# Patient Record
Sex: Male | Born: 1975 | Race: Black or African American | Hispanic: No | Marital: Married
Health system: Midwestern US, Community
[De-identification: ages and names within clinical notes are randomized; demographics above are authoritative.]

## PROBLEM LIST (undated history)

## (undated) DIAGNOSIS — E7800 Pure hypercholesterolemia, unspecified: Secondary | ICD-10-CM

## (undated) DIAGNOSIS — I219 Acute myocardial infarction, unspecified: Secondary | ICD-10-CM

## (undated) DIAGNOSIS — I341 Nonrheumatic mitral (valve) prolapse: Secondary | ICD-10-CM

## (undated) DIAGNOSIS — I2510 Atherosclerotic heart disease of native coronary artery without angina pectoris: Secondary | ICD-10-CM

## (undated) DIAGNOSIS — E119 Type 2 diabetes mellitus without complications: Secondary | ICD-10-CM

## (undated) DIAGNOSIS — K009 Disorder of tooth development, unspecified: Secondary | ICD-10-CM

## (undated) DIAGNOSIS — K0889 Other specified disorders of teeth and supporting structures: Secondary | ICD-10-CM

## (undated) DIAGNOSIS — K029 Dental caries, unspecified: Secondary | ICD-10-CM

## (undated) DIAGNOSIS — R590 Localized enlarged lymph nodes: Secondary | ICD-10-CM

## (undated) HISTORY — PX: PR MINIMALLY INVASIVE DIRECT CO: S2208

## (undated) HISTORY — PX: CORONARY ANGIOPLASTY WITH STENT PLACEMENT: SHX49

## (undated) NOTE — ED Notes (Signed)
Formatting of this note might be different from the original.  12 Lead EKG performed and given to Corrie Mckusick MD.    Electronically signed by Early Chars, NAI at 10/27/2014 12:16 AM EDT

## (undated) NOTE — ED Provider Notes (Signed)
 Formatting of this note is different from the original.  [facility] EMERGENCY  [address]  [place] [address]  [phone]    History  No chief complaint on file.    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  Denies any vomiting.    Past Medical History:   Diagnosis Date    Coronary artery disease     Hypercholesteremia     MI (myocardial infarction) (CMS/HCC)     x3     HISTORY PROVIDED BY: Patient    Past Surgical History:   Procedure Laterality Date    BYPASS GRAFT      x 2    CHOLECYSTECTOMY       Social History     Tobacco Use    Smoking status: Every Day     Current packs/day: 0.50     Types: Cigarettes    Smokeless tobacco: Never   Substance Use Topics    Drug use: No     Review of Systems   All other systems reviewed and are negative.    Physical Exam  BP (!) 162/83 (BP Location: Left arm, Patient Position: Sitting)   Pulse 76   Temp 97.7 F (36.5 C) (Temporal)   Resp 18   SpO2 99%     Physical Exam  Vitals and nursing note reviewed.   Constitutional:       General: He is not in acute distress.  HENT:      Head: Normocephalic and atraumatic.      Right Ear: External ear normal.      Left Ear: External ear normal.      Mouth/Throat:      Mouth: Mucous membranes are moist.      Pharynx: No oropharyngeal exudate.   Eyes:      Conjunctiva/sclera: Conjunctivae normal.      Pupils: Pupils are equal, round, and reactive to light.   Cardiovascular:      Rate and Rhythm: Normal rate and regular rhythm.   Pulmonary:      Effort: Pulmonary effort is normal. No respiratory distress.      Breath sounds: Normal breath sounds.   Abdominal:      General: Bowel sounds are normal. There is no distension.      Palpations: Abdomen is soft.       Tenderness: There is no abdominal tenderness.   Musculoskeletal:         General: Normal range of motion.      Cervical back: Normal range of motion and neck supple.   Skin:     General: Skin is warm and dry.   Neurological:      Mental Status: He is alert.      Cranial Nerves: Cranial nerves 2-12 are intact.      Sensory: Sensation is intact.      Motor: Motor function is intact.      Coordination: Finger-Nose-Finger Test normal.      Comments: 5/5 strength to B/L U/L extremities; no diminished sensation to light touch     Results  Labs Reviewed   CBC W/ AUTO DIFF - Abnormal       Result Value    WBC 4.88  RBC 4.40      Hemoglobin 13.4 (*)     Hematocrit 40.5      MCV 92.0      MCH 30.5      MCHC 33.1 (*)     RDW 12.7      Platelets 216      MPV 10.4      NRBC 0.0      Neutrophils (Relative) 56.4      Lymphocytes (Relative) 34.0      Monocytes (Relative) 6.8      Eosinophils (Relative) 1.8      Basophils (Relative) 0.6      Immature Granulocytes (Relative) 0.4      Neutrophils (Absolute) 2.8      Lymphocytes (Absolute) 1.7      Monocytes (Absolute) 0.3      Eosinophils (Absolute) 0.1      Basophils (Absolute) 0.0      Immature Granulocytes (Absolute) 0.0     COMPREHENSIVE METABOLIC PANEL - Abnormal    Sodium 138      Potassium 4.0      Chloride 102      CO2 28.7      BUN 11.0      Creatinine 0.95      Glucose 301 (*)     Calcium 8.9      AST 74 (*)     Alkaline Phosphatase 69      Total Protein 7.2      Albumin 3.2 (*)     Total Bilirubin 0.3      Glomerular Filtration Rate >90.0      Anion Gap 7.3      ALT 116 (*)     Narrative:     STAGES OF CHRONIC KIDNEY DISEASE    STAGE      GFR           DESCRIPTION  1           90.0+         Normal Kidney function but urine findings or structural abnormalities or genetic trait point to kidney disease.    2           60.0-89.0     Mildly reduced kidney function, and other findings (as in stage 1) point to kidney disease.    3           30.0-59.0     Moderately reduced  kidney function.    4           15.0-29.0     Severely reduced kidney function.    5           <15.0         Very severe, or endstage kidney failure.   APTT - Abnormal    PTT 26.3 (*)     Narrative:     Population Mean 28 sec.   LIPASE, SERUM - Normal    Lipase 31     PROTIME-INR - Normal    Protime 11.1      INR 1.01      Narrative:     Clinical Situation                       INR Target      Mechanical prosthetic heart valves       2.5-3.5  Prevention of systemic embolism from     2.0-3.0       Acute myocardila infarction  Valvular heart disease       Atrial fibrilation  Pulmonary embolism treatment             2.0-3.0  Venous thrombosis treatment              2.0-3.0   TROPONIN I - Normal    HS Troponin I 67      Narrative:     High-sensitivity Troponin I (hsTnI) assay can reliably detect low troponin concentrations relative to conventional troponin assays. It is the preferred marker of myocardial necrosis as recommended by the Fourth Universal Definition of Myocardial Infarction Guidelines.    The diagnosis of acute myocardial infarction (AMI) is made based on a rise or fall of troponin with at least one measurement exceeding the laboratory's upper limit of normal(indicating myocardial injury), in the context of reasonable suspicion for coronary ischemia (e.g. typical symptoms, changes on ECG, evidence for loss of myocardial function or demonstration of obstructive coronary artery disease).    NOTE: Although abnormal hsTnI values reflect INJURY to myocardial cells, an elevated hsTnI does not indicate the CAUSE of injury (i.e. ischemia versus non-ischemic disease).    In some cases, myocardial injury is chronic and relatively stable such that hsTnI values remain elevated but do not change substantially over hours to days (examples include chronic kidney disease, heart failure or advanced patient age).    When determining whether there has been a significant rise or fall of troponin on serial sampling,  absolute change in troponin concentration has greater diagnostic accuracy for AMI than relative change criteria. A change of >7 ng/L over a 2-hour interval or a change of >10 ng/L over a 3-hour interval is suggested as a significant change.  If the initial hsTnI is below or slightly above the 99th percentile upper reference limit, a 50% change from the baseline is considered significant.  If the initial hsTnI is above the 99th percentile upper reference limit, a 20% change from the baseline value is considered significant.   NT-PROBRAIN NATRIURETIC PEPTIDE - Normal    NT-proBNP 67      Narrative:     BNP Interpretation:    Among patients with dyspnea, BNP is highly sensitive for the  detection of acute congestive heart failure. In addition, a BNP  <300 pg/ml effectively rules out acute congestive heart failure, with  99% negative predictive value.    Knowledge of each individual patient's BNP range may be more  useful than using similar cut-points for every patient.  Marked elevations in BNP levels may be observed in states other  than left ventricular congestive failure, including: acute coronary  syndromes, right heart strain/failure (including pulmonary embolism and  cor pulmonale), critical illness, renal failure, as well as advanced  age.    Falsely low BNP in congestive heart failure patients may be  observed with increasing body--mass index.   URINALYSIS, W/ REFLEX TO MICRO AND CULT, IF INDICATED   TROPONIN I     Imaging  Imaging Results           X-ray Chest 1 View (Final result)  Result time 08/26/22 14:50:40      Final result by Pat Patrick, MD (08/26/22 14:50:40)              Impression:    No acute pulmonary process.             Narrative:    PROCEDURE: XR CHEST 1 VIEW    INDICATION: Chest Pain,    COMPARISON: None.  FINDINGS:   Lines and Tubes: None.    Heart and Mediastinum: The heart and mediastinal contours appear unremarkable.    Lungs and Pleura: The lungs are clear.  There are no pleural  effusions.    Skeletal Structures: Stable postsurgical changes in the sternum including median sternotomy wires and a fixation hardware. No acute osseous findings.                          ED Course      Procedures    MDM  Number of Diagnoses or Management Options  Chest pain  Diagnosis management comments: Differential diagnosis includes: ACS, viral URI, bacterial pneumonia, pneumothorax.    Labs ordered along with EKG, x-ray and respiratory panel.  Initial EKG demonstrates sinus rhythm rate of 73 bpm, otherwise normal axis and intervals, nonspecific ST changes with no old EKG available for prior, repeat EKG demonstrated sinus rhythm at a rate of 75 bpm, otherwise normal axis and intervals, redemonstration of nonspecific ST changes.  Initial troponin is negative, patient multiple allergies to morphine does improve his pain temporarily for roughly 2 hours, labs otherwise unremarkable, given patient's extensive risk factors include previous cardiac arrest, multiple myocardial infarctions, and story concerning for ACS, patient will be admitted to the hospital service.    ED Disposition  Admit    Final diagnoses:   Chest pain     No follow-up provider specified.     Delila Spence, MD  08/26/22 1918    Electronically signed by Delila Spence, MD at 08/26/2022  4:18 PM PDT

## (undated) NOTE — Unmapped External Note (Signed)
 Formatting of this note might be different from the original.    Problem: Pain  Goal: Patient's pain/discomfort is manageable  Description: Assess and monitor patient's pain using appropriate pain scale. Collaborate with interdisciplinary team and initiate plan and interventions as ordered. Re-assess patient's pain level 30 - 60 minutes after pain management intervention.   Outcome: Progressing    Problem: Safety  Goal: Patient will be injury free during hospitalization  Description: Assess and monitor vitals signs, neurological status including level of consciousness and orientation. Assess patient's risk for falls and implement fall prevention plan of care and interventions per hospital policy.     Ensure arm band on, uncluttered walking paths in room, adequate room lighting, call light and overbed table within reach, bed in low position, wheels locked, side rails up per policy, and non-skid footwear provided.   Outcome: Progressing    Problem: Acute Pain:  Goal: Patient reported angina episodes are decreased in frequency, duration and severity  Outcome: Progressing    Problem: Anxiety:  Goal: Achieve calm, comfortable environment  Outcome: Progressing    Electronically signed by Delena Serve, RN at 08/26/2022  9:30 PM PDT

## (undated) NOTE — ED Notes (Signed)
Formatting of this note might be different from the original.  Update    -EKG and trop unremarkable   -CBC, CXR, and BMP unremakralb  -pt still having pain will give morphine   -hospitalist called for admission   Electronically signed by Maudie Flakes., MD at 10/27/2014  4:50 AM EDT

## (undated) NOTE — ED Notes (Signed)
Formatting of this note might be different from the original.  Dr Katrinka Blazing at bedside.  Electronically signed by Martyn Ehrich, RN at 10/27/2014  6:04 AM EDT

## (undated) NOTE — ED Notes (Signed)
Formatting of this note is different from the original.  Patient requesting to leave against medical advise     The patient is clinically sober, free from distracting injury, appears to have intact insight and judgment and reason and in my opinion has the capacity to make decisions.  The patient presents with chest pain and extensive cardiac history. I have explained that I am concerned that this may represent ACS; they have verbalized an understanding of my concerns.  I have told the patient that while their labs were normal, they could still be having a heart attack.  I have discussed the need for admission to get more information about potential causes of the patient?s pain.  I have told the patient that if they leave, they could get much worse, could become critically ill, and could possibly become disabled or die.  I have offered to give the patient more pain medication. I have asked them to stay in the hospital for stress test. I have asked the patient what I can do to convince them to stay for further treatment. He states he has a family emergency and has to leave and no longer has chest pain.  The patient is not willing to undergo any more further work up. He is unwilling to stay overnight for monitoring. He is refusing any further care and is leaving against medical advice.  I am unable to convince the patient to stay, I have asked them to return as soon as possible to complete their evaluation. I have answered all their questions.  Hospitalist was updated above the above  f/u with PCP and cardiology      Electronically signed by Maudie Flakes., MD at 10/27/2014  6:38 AM EDT

## (undated) NOTE — ED Provider Notes (Signed)
Formatting of this note is different from the original.  [facility] Emergency Department History & Physical    Name: Nicholas Salas Age: 69 y.o. Male DOB: 05/17/1976 MRN: [id number] HAR: [id number] Date of Service: 10/27/2014    Chief Complaint   Patient presents with   ? Chest Pain     pt. states he has been having CP x 2 hours, states has had a syncopal episode 2 days ago       HPI: 56 y.o. Male who presents with 1.5 hr h/o sudden on set constant squeezing L sided chest pressure that radiates to his neck and down his L arm; associated with nausea. Symptoms started at rest and feel the same as prior heart attack. Denies Fever, Chills, H/A, SOB, Abdominal Pain, drug use, or Dysuria.     Review of Systems   Constitutional: Negative.  Negative for fever.   HENT: Negative.  Negative for sore throat.    Eyes: Negative.  Negative for visual disturbance.   Respiratory: Negative.  Negative for shortness of breath.    Cardiovascular: Positive for chest pain.   Gastrointestinal: Positive for nausea. Negative for abdominal pain.   Genitourinary: Negative.  Negative for dysuria.   Musculoskeletal: Negative.  Negative for gait problem.   Skin: Negative.  Negative for rash.   Neurological: Negative.  Negative for weakness, numbness and headaches.   Hematological: Negative.  Does not bruise/bleed easily.   Psychiatric/Behavioral: Negative.    All other systems reviewed and are negative.    Reviewed Pertinent: past records, current meds, allergies, vital signs, and nurses notes.  Past Medical History   Diagnosis Date   ? MI, old    ? Heart palpitations    ? Hyperlipemia    ? Hypertension      Past Surgical History   Procedure Laterality Date   ? Sur - coronary artery bypass graft       x 2 procedures   ? Sur - cholecystectomy       Family History   Problem Relation Age of Onset   ? Stroke Father      in 9s     History     Social History   ? Marital Status: Single     Spouse Name: N/A     Number of Children: N/A   ? Years  of Education: N/A     Social History Main Topics   ? Smoking status: Current Every Day Smoker -- 0.20 packs/day     Types: Cigarettes   ? Smokeless tobacco: Not on file   ? Alcohol Use: No   ? Drug Use: No   ? Sexual Activity:     Partners: Female     Other Topics Concern   ? Not on file     Social History Narrative     Allergies   Allergen Reactions   ? Codeine Hives   ? Ibuprofen Hives   ? Ivp Dye [Iodine And Iodide Containing Products]    ? Toradol [Ketorolac]      hives   ? Tramadol      BP 132/66 mmHg  Pulse 74  Temp(Src) 36.9 C (98.4 F)  Resp 20  SpO2 97% Temp (24hrs), Avg:36.9 C (98.4 F), Min:36.9 C (98.4 F), Max:36.9 C (98.4 F)    Physical Exam   Constitutional: He is oriented to person, place, and time. No distress.   HENT:   Head: Normocephalic and atraumatic.   Eyes: EOM are normal.  Neck: Normal range of motion.   Cardiovascular: Normal rate, regular rhythm, normal heart sounds and intact distal pulses.    Pulmonary/Chest: Effort normal. No respiratory distress. He has no wheezes. He has no rales. He exhibits no tenderness.   Abdominal: Soft. Bowel sounds are normal. He exhibits no distension. There is no tenderness. There is no rebound.   Musculoskeletal: Normal range of motion. He exhibits no edema or tenderness.   Neurological: He is alert and oriented to person, place, and time.   Skin: Skin is warm.   Psychiatric: He has a normal mood and affect.   Nursing note and vitals reviewed.    MEDICAL DECISION MAKING/ASSESSMENT  -chest pain with unchanged EKG and normal CXR. PERC negative: DDx Acute coronary syndrome, esophageal spasms, pleuritis, costochondritis, GERD/gastritis/peptic ulcer disease, less concern for aortic dissection, pulmonary embolism at this time    PLAN (see order history for further details)  -EKG and Troponin to evaluate for ACS/dysrhythmias    -CBC to evaluate for signs of infection, anemia, thrombocytopenia   -BMP to evaluate for electrolyte, renal, glycemic  abnormalities  -CXR to evaluate for infiltrate, effusions, cardiomegaly, pneumothorax, widen mediastinum or other acute abnormalities  -ASA, Nitroglycerin, Zofran  -disposition/further treatment based on the above investigational findings and response to treatment      Discuss with Attending Physician     Electronically signed by...........................Marland KitchenBeryle Lathe Katrinka Blazing, M.D.       Electronically signed by Corrie Mckusick, MD at 10/27/2014  2:17 AM EDT    Associated attestation - Corrie Mckusick, MD - 10/27/2014  2:17 AM EDT  Formatting of this note might be different from the original.  Attending Physician Note:     I reviewed the history and physical examination as noted by Renato Battles MD on date of service 10/27/2014.    I also examined the patient and reviewed the nursing record. I discussed medical decision-making and the patient's evaluation and treatment with them contemporaneously.    Nitro SL x2 not helping CP, giving pt HA  Added tylenol and morphine

## (undated) NOTE — Nursing Note (Signed)
 Formatting of this note might be different from the original.  Patient left the hospital against medical advice, he also refused to sign the AMA form or wait to speak to the doctor. This Clinical research associate explained the risks of leaving AMA.  This Clinical research associate paged Dr. Garner Gavel to inform. IV in right forearm  removed by the this Clinical research associate before departure. Patient took all of his belongings (Cell phone).  Electronically signed by Lewis Shock, LVN at 08/27/2022  9:57 AM PDT

## (undated) NOTE — Unmapped External Note (Signed)
 Formatting of this note might be different from the original.    Problem: Pain  Goal: Patient's pain/discomfort is manageable  Description: Assess and monitor patient's pain using appropriate pain scale. Collaborate with interdisciplinary team and initiate plan and interventions as ordered. Re-assess patient's pain level 30 - 60 minutes after pain management intervention.   Outcome: Progressing    Problem: Safety  Goal: Patient will be injury free during hospitalization  Description: Assess and monitor vitals signs, neurological status including level of consciousness and orientation. Assess patient's risk for falls and implement fall prevention plan of care and interventions per hospital policy.     Ensure arm band on, uncluttered walking paths in room, adequate room lighting, call light and overbed table within reach, bed in low position, wheels locked, side rails up per policy, and non-skid footwear provided.   Outcome: Progressing    Electronically signed by Lewis Shock, LVN at 08/27/2022 10:08 AM PDT

## (undated) NOTE — ED Notes (Signed)
Formatting of this note might be different from the original.  Assumed pt care.  SR on cardiac monitor.  Reports CP at this time.  Denies any additional complaints.  Denies nausea.  Call bell within reach.    Electronically signed by Martyn Ehrich, RN at 10/27/2014  3:24 AM EDT

## (undated) NOTE — Consults (Signed)
 Formatting of this note is different from the original.  CARDIOLOGY CONSULTATION    Name:   Nicholas Salas MRN:   [id number]   DOB:   Jun 18, 1975 Acct:   [id number]   Age:   70 y.o. Admit Date:   08/26/2022  1:41 PM   Sex:   male Attending:   Melton Krebs, MD   PCP:  No PCP, MD Location:   [facility] 1E MED SURG     REASON FOR THE CONSULT : Chest pain    History of Present Illness     44 y.o. male significant past medical history of anomalous RCA status post CABG 2007.  Followed by failed graft.  Status post another open heart surgery with LIMA to the RCA.  Patient has not been followed with cardiologist.  Patient has been going to multiple hospitals multiple evaluations for chest pain.  Which is relieved only with opiates.  Patient claimed that he has allergy to nitroglycerin.  And only if his pain is relieved by opiates.  2022 I saw the patient.  At that time patient had more than 80 ER visits.  And had a team provider prescribed more than 26 prescription for opiates.  Patient failed to follow-up.  Patient currently is complaining of retrosternal pressure-like chest pain.  Nonradiating.  Last last for multiple hours.  Nonexertional.    Review of Systems   Review of Systems   Constitutional: Negative.    HENT: Negative.     Eyes: Negative.    Respiratory: Negative.  Negative for cough, chest tightness, shortness of breath and wheezing.    Cardiovascular:  Positive for chest pain. Negative for palpitations and leg swelling.   Endocrine: Negative.    Genitourinary: Negative.    Allergic/Immunologic: Negative.    Neurological: Negative.    Hematological: Negative.    All other systems reviewed and are negative.      Patient History   Medical:     Past Medical History:   Diagnosis Date    Coronary artery disease     Hypercholesteremia     MI (myocardial infarction) (CMS/HCC)     x3     Surgical:     Past Surgical History:   Procedure Laterality Date    BYPASS GRAFT      x 2    CHOLECYSTECTOMY       Family:   History  reviewed. No pertinent family history.     No family status information on file.     Social:    reports that he has been smoking cigarettes. He does not have any smokeless tobacco history on file. He reports that he does not drink alcohol and does not use drugs.       Allergies   Allergies   Allergen Reactions    Nitroglycerin Hives, Other (See Comments) and Rash     Patient states "passess out"  States drops his BP "too much"  "syncope and hives"  Patient states "passess out"  States drops his BP "too much"  "syncope and hives"  Patient states "passess out"  States drops his BP "too much"     Nsaids     Toradol [Ketorolac Tromethamine]     Tramadol        Prescriptions Prior to Arrival   Medications Prior to Admission   Medication Sig Dispense Refill Last Dose    aspirin 325 MG tablet Take 1 tablet (325 mg total) by mouth daily  atorvastatin (LIPITOR) 40 MG tablet Take 40 mg by mouth daily.       metoprolol tartrate (LOPRESSOR) 25 MG tablet Take 25 mg by mouth 2 (two) times a day.          Physical Examination     Vitals       Current   24 Hour Min / Max   Temp (!) 97.6 F (36.4 C) Temp  Min: 97.6 F (36.4 C)  Max: 98.1 F (36.7 C)   BP 106/76  BP  Min: 106/76  Max: 162/83   Pulse 80 Pulse  Min: 55  Max: 80   Resp 16 Resp  Min: 16  Max: 19   SpO2 97 % SpO2  Min: 96 %  Max: 99 %   Weight (!) 119 kg (262 lb 2 oz) Initial Weight (!) 118 kg (260 lb)     Wt Readings from Last 5 Encounters:   08/26/22 (!) 119 kg (262 lb 2 oz)   07/03/21 (!) 118 kg (260 lb)   11/16/20 (!) 118 kg (260 lb)   06/04/18 (!) 122 kg (270 lb)   05/14/17 (!) 118 kg (260 lb)       I/O         04/06 0701  04/07 0700 04/07 0701  04/08 0700 04/08 0701  04/09 0700    P.O.   360    IV Piggyback  500     Total Intake(mL/kg)  500 (4.2) 360 (3)    Net  +500 +360             Intake/Output Summary (Last 24 hours) at 08/27/2022 1221  Last data filed at 08/27/2022 0957  Gross per 24 hour   Intake 860 ml   Output --   Net 860 ml     Net IO Since Admission:  860 mL [08/27/22 1221]     Physical Exam:    General:  No acute distress, AAO x 3, pleasant   HEENT:   Normocephalic, atraumatic, bilateral external ears normal, PERRLA, conjunctiva/corneas clear, moist mucous membranes, no exudates.    Neck:  Normal range of motion, supple. No tenderness, no JVD, no lymphadenopathy.   Respiratory:  CTA B/L, good air flow. No wheezes, rhonchi, or rales.   Heart:   Regular rate and rhythm, S1/ S2 intact, no murmurs/ rubs/ gallops   Abdomen/GI:  Soft. NT/ND, +BS, no G/R/R   Musculoskeletal:  Intact distal pulses,  no tenderness, no cyanosis   Skin:   no rashes/ bruising   Extremities:   no edema. Bilateral pedal pulses +2    Neurologic:   Awake/ Alert & oriented x 3, normal motor and sensory function, no focal deficits     Current Medications:     Scheduled Meds    aspirin  81 mg Oral Daily    atorvastatin  40 mg Oral Daily    empagliflozin  10 mg Oral Daily    enoxaparin (LOVENOX) injection  40 mg Subcutaneous Q24H SCH    gabapentin  100 mg Oral BID    insulin lispro  2-12 Units (Medium) Subcutaneous With meals & nightly    losartan  50 mg Oral Daily    metFORMIN  500 mg Oral BID with meals    metoprolol tartrate  25 mg Oral BID     Infusing Meds     PRN Meds   acetaminophen, 650 mg, Q4H PRN  dextrose, 50 mL, Q15 Min PRN   Or  dextrose,  250 mL, Q15 Min PRN   Or  glucagon (rDNA), 1 mg, Daily PRN  docusate sodium, 100 mg, Daily PRN  glucose, 15 grams of glucose, Q15 Min PRN  ondansetron, 4 mg, Q4H PRN  oxyCODONE, 5 mg, Q6H PRN  polyethylene glycol, 17 g, Daily PRN  sodium chloride, 3 mL, Q8H PRN          [facility] (last 7 days):      ABG     Anemia     CBC   Results from last 7 days   Lab Units 08/27/22  0928 08/26/22  1421   WBC AUTO 10*3/uL 4.47 4.88   RBC AUTO 10*6/uL 4.49 4.40   HEMOGLOBIN g/dL 16.1 09.6*   HEMATOCRIT % 41.6 40.5   MCV fL 92.7 92.0   MCH pg 30.5 30.5   MCHC g/dL 04.5* 40.9*   RDW % 81.1 12.7   PLATELETS AUTO 10*3/uL 198 216   MPV fL 10.1 10.4   NEUTROPHILS  RELATIVE % 55.2 56.4   LYMPHOCYTES RELATIVE % 33.3 34.0   MONOCYTES RELATIVE % 8.7 6.8   EOSINOPHILS RELATIVE % 2.0 1.8   BASOPHILS RELATIVE % 0.4 0.6   NEUTROS ABS 10*3/uL 2.5 2.8   LYMPHS ABS AUTO 10*3/uL 1.5 1.7   MONOS ABS AUTO 10*3/uL 0.4 0.3   EOS ABS AUTO 10*3/uL 0.1 0.1   BASOS ABS AUTO 10*3/uL 0.0 0.0     CMP   Results from last 7 days   Lab Units 08/27/22  0928 08/26/22  1421   SODIUM mmol/L 136 138   POTASSIUM mmol/L 4.3 4.0   CHLORIDE mmol/L 101 102   CO2 mmol/L 28.8 28.7   BUN mg/dL 91.4 78.2   CREATININE mg/dL 9.56 2.13   GLOMERULAR FILTRATION RATE mL/min/1.36m*2 >90.0 >90.0   AST U/L 77* 74*   ALT U/L 120* 116*   ALK PHOS U/L 67 69   ALBUMIN g/dL 3.1* 3.2*   MAGNESIUM mg/dL 0.86  --    LIPASE U/L  --  31   BILIRUBIN TOTAL mg/dL 0.3 0.3   CALCIUM UA mg/dL 9.0 8.9     Results from last 7 days   Lab Units 08/27/22  1122 08/27/22  0928 08/27/22  0759 08/26/22  1421   POC GLUCOSE mg/dL 578*  --  469*  --    GLUCOSE mg/dL  --  629*  --  528*     Cardiac   Results from last 7 days   Lab Units 08/27/22  0928 08/26/22  1917 08/26/22  1421   HS TROPONIN I ng/L 69 66 67     Coagulation   Results from last 7 days   Lab Units 08/26/22  1421   PROTIME seconds 11.1   INR  1.01   PTT seconds 26.3*     Drug Tox Screen     Thyroid Function / HGBA1C   Results from last 7 days   Lab Units 08/27/22  0928   TSH uIU/mL 1.200   HEMOGLOBIN A1C % 11.2*     Urine       Micro   No results found for: "BLOODCX", "CATHETERCX", "CLOSTD", "GRAMSTAIN", "MRSACULTURE", "RESPCULTURE", "SPUTUMCULTUR", "URINECX"       Imaging   X-ray Chest 1 View    Result Date: 08/26/2022  PROCEDURE: XR CHEST 1 VIEW INDICATION: Chest Pain, COMPARISON: None. FINDINGS: Lines and Tubes: None. Heart and Mediastinum: The heart and mediastinal contours appear unremarkable. Lungs and Pleura: The lungs are clear.  There are no pleural effusions. Skeletal Structures: Stable postsurgical changes in the sternum including median sternotomy wires and a fixation  hardware. No acute osseous findings.     No acute pulmonary process.         X-ray Chest 1 View    Result Date: 08/26/2022  No acute pulmonary process.           Hospital Problem List:    Chest pain    #1 atypical/noncardiac chest pain.  Patient need to have pain management by pain specialist.  Doubt cardiac origin for that current chest pain.    2.  Hypertension continue scar medication.    3.  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  Patient probably will need nuclear stress test as an outpatient.    Patient is stable cardiac wise to be discharged.    Author: Koleen Nimrod, MD     During the encounter.  I have discussed the case with the patient thoroughly.  As well as with the admitting team attending and residents.  I have personally reviewed images.  EKGs.  Thoroughly checked telemetry.  Check patient labs.  Old records if needed.  Also I have discussed with the nursing staff about the development over the past 24 hours.  Please note that the chart has also been partially dictated, and there may be clerical errors.  The note may not reflect the data known because of improper importation          Electronically signed by Koleen Nimrod, MD at 08/27/2022  9:26 AM PDT

## (undated) NOTE — ED Notes (Signed)
Formatting of this note might be different from the original.  Med administered per MD order, denies any additional complaints.  NAD.  SB on cardiac monitor.  Electronically signed by Martyn Ehrich, RN at 10/27/2014  5:20 AM EDT

## (undated) NOTE — H&P (Signed)
 Formatting of this note is different from the original.  History and Physical-Nicholas Oakwood, DO  ATT: Delila Spence, MD;* PCP: No PCP, MD  Nicholas Salas 46 y.o.(Sep 05, 1975) [facility] EMERGENCY  LOS: 0 days     MRN: [id number] Acct# [id number]    History of Present Illness     CC: No chief complaint on file.    Nicholas Salas is a 71 y.o. male from home who reports constant for the past several hours very intense left-sided chest pain extending to the jaw, to the thoracic area of the back and to the arm on the left side, which symptom began while the patient was slowly walking.    No other associated symptoms or recent illness. No change of chest pain with rest or change of body position. (Nitroglycerin could not be given in view of reported allergy of the patient to such medication.)    Report of recurrent chest pain for at least many months or years.    No recent medical follow-up, including no Cardiology follow-up for years.    Medical records show several hospital visits to this institution by the patient in the past few years with the same complaint. The patient had left medical care against medical advice in most or all of those cases. No definite specific unstable heart disease had been found during previous hospital visits.    Medical records include mention of evidence of patient's visit of hospital ER in [place], where he had lived, on average twice a week for at least a year, and include mention of two dozen prescriptions of opiate medication from many physicians in the same period of time. Today, the patient was treated with morphine in ER and is now asking for more morphine for "severe chest pain."    Today, there is so far no evidence of acute or unstable heart disease, including no sign of acute coronary syndrome, of cardiac arrhythmia or of heart failure.    Very high blood glucose level, indicating uncontrolled diabetes mellitus, is again seen.  The patient has not been treated for diabetes  mellitus.    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 (Details are noted in medical records, for example, in previous Cardiology note from Dr Margarette Asal.)    ROS: recurrent "syncope" of unspecified cause.    CASE DISCUSSION:  Chest pain is atypical of angina and, likely, altogether non-anginal and non-cardiac, of uncertain cause, and seems to be chronic and near constant, and I suggest outpatient Pain Management consultation and follow-up for the same complaint.  The patient seems to have been poorly compliant with medical follow-up and, moreover, has recently moved to this area and has not established local medical care, and I suggest that upon discharge from hospital he be referred to specific local physicians for medical follow-up.  Diabetes mellitus should be fully evaluated and treated, and diabetic education should be provided to the patient.     Review of Systems (Complete=10 or more; Extended=2-9; Problem pertinent=1)     All systems reviewed and are negative except as per history of present illness.     PMH     Past Medical History:   Diagnosis Date    Coronary artery disease     Hypercholesteremia     MI (myocardial infarction) (CMS/HCC)     x3     Past Surgical History:   Procedure Laterality Date  BYPASS GRAFT      x 2    CHOLECYSTECTOMY       No family history on file.   No family status information on file.     Social History     Socioeconomic History    Marital status: Single     Spouse name: Not on file    Number of children: Not on file    Years of education: Not on file    Highest education level: Not on file   Tobacco Use    Smoking status: Every Day     Current packs/day: 0.50     Types: Cigarettes    Smokeless tobacco: Never   Substance and Sexual Activity    Alcohol use: Not on file     Comment: social    Drug use: No    Sexual activity: Yes     Partners:  Female     (Not in a hospital admission)    Allergies   Allergen Reactions    Nitroglycerin Hives, Other (See Comments) and Rash     Patient states "passess out"  States drops his BP "too much"  "syncope and hives"  Patient states "passess out"  States drops his BP "too much"  "syncope and hives"  Patient states "passess out"  States drops his BP "too much"     Nsaids     Toradol [Ketorolac Tromethamine]     Tramadol      Physical Exam (Comprehensive 8; Detailed 5-7; Expanded Problem Focused 2-4; Focused 1)     24 Hour Min / Max Current   Temp  Min: 97.7 F (36.5 C)  Max: 97.7 F (36.5 C) 97.7 F (36.5 C)   BP  Min: 133/70  Max: 162/83 133/70    Pulse  Min: 62  Max: 76 62   Resp  Min: 18  Max: 19 19   SpO2  Min: 99 %  Max: 99 % 99 %   Admit: (!) 118 kg (260 lb) (!) 118 kg (260 lb)  Body mass index is 31.65 kg/m.     I/O         04/06 0701  04/07 0700 04/07 0701  04/08 0700    IV Piggyback  500    Total Intake(mL/kg)  500 (4.2)    Net  +500            Const: Obese. Comfortable, lying in bed.   Head:  Atraumatic   Eyes:  Normal Conjunctiva   ENT:  Normal External Ears, Nose and Mouth   Neck:  Full range of motion.  No meningismus.   Resp:  Clear to auscultation bilaterally   Cards: Regular rhythm, no murmurs. No chest trauma, deformity or tenderness.   Abd:  Soft, non tender, non distended. Normal bowel sounds.   Skin:  No petechiae or rashes. Many tattoos.   Back:  No midline or flank tenderness   Ext:  No cyanosis or edema   Neur:  Awake and alert   Psych: Normal Mood and Affect    Data Review:     Results from last 7 days   Lab Units 08/26/22  1421 08/26/22  1917   WBC AUTO 10*3/uL 4.88  --    HEMOGLOBIN g/dL 16.1*  --    HEMATOCRIT % 40.5  --    PLATELETS AUTO 10*3/uL 216  --    SODIUM mmol/L 138  --    POTASSIUM mmol/L 4.0  --    CHLORIDE  mmol/L 102  --    CO2 mmol/L 28.7  --    BUN mg/dL 52.8  --    CREATININE mg/dL 4.13  --    GLUCOSE mg/dL 244*  --    CALCIUM UA mg/dL 8.9  --    BILIRUBIN TOTAL mg/dL 0.3   --    AST U/L 74*  --    ALT U/L 116*  --    ALK PHOS U/L 69  --    LIPASE U/L 31  --    ALBUMIN g/dL 3.2*  --    INR  0.10  --    HS TROPONIN I ng/L 67 66         Invalid input(s): "PCO2", "PO2", "HCO3", "O2SAT"      X-ray Chest 1 View    Result Date: 08/26/2022  No acute pulmonary process.          Meds   Scheduled Meds:  Continuous Infusions:  PRN Meds:.  sodium chloride  D/C Medications (36h ago, onward)      None         Consult Orders   None   A/P     Assessment:     Chest pain, likely non-anginal unspecified, chronic    Coronary artery disease    Previous unspecified heart attacks    Previous coronary artery bypass graft surgery    Hyperglycemia in diabetes mellitus type II (untreated)    Dyslipidemia    Obesity    Abnormal (high) serum levels of liver-associated enzymes, likely effect of statin medication and of fatty liver    History of recurrent unspecified syncope    Previous gastric bypass surgery    Habitual tobacco smoking    Plan:  The patient was admitted to 'Inpatient status' with anticipation of 2 or more midnight stay.    Monitor over night on telemetry  Cardiology follow-up  Echocardiography  May consider cardiac stress test, inpatient or outpatient  Add metformin  Add Jardiance  Add losartan  Avoid opiate medication  Blood testing in the morning tomorrow  Monitor and optimize control of diabetes mellitus  Optimize long-term medical follow-up, compliance with medical care, medication, cardiovascular risk factor control    Level of Care - Telemetry    Reason for inpatient stay - chest pain, CAD, hyperglycemia    Carren Rang, DO  Electronic Signature  08/26/2022 9:06 PM      Electronically signed by Carren Rang, DO at 08/26/2022  6:42 PM PDT

## (undated) NOTE — ED Notes (Signed)
Formatting of this note might be different from the original.  RN called to room, reports "I have a family emergency and need to go home right now."  Pt currently taking off gown and monitoring equipment at this time.  Informed pt that this RN will notify MD of this request.  Informed pt that RN must remove PIV before leaving.  Dr Katrinka Blazing notified of this info.  Electronically signed by Martyn Ehrich, RN at 10/27/2014  6:03 AM EDT

## (undated) NOTE — Telephone Encounter (Signed)
Formatting of this note might be different from the original.  Pt was busy when I called and he will call back to schedule appt.     Thanks  Noreene Larsson    Electronically signed by Pryor Ochoa A at 08/17/2020 11:40 AM EDT

## (undated) NOTE — Unmapped External Note (Signed)
 Formatting of this note is different from the original.     08/27/22 1200   Ongoing Discharge Planning   Living Arrangements Family members   Patient Arrived From? Other (Comment)  (Patient stated he has just moved here and is staying in a hotel for now.)   Usual Residence Private residence   Support Systems Family members   Patient Designated Caregiver Self   Consent Signed to Release Medical Information to Caregiver No   Patient Unable to Designate a Caregiver, Follow up Needed No   Patient Declined to Designate a Caregiver No   Assistance Needed Denies   Current Residence Private residence   Home Care Services No   Patient expects to be discharged to: Private residence   Patient Declined Referral to Discharge Planning/Case Management No   Additional Comments I met with the patient at the bedside who stated he has just moved here and is currently staying in a hotel . Stated independent with ADL's at baseline. Denies use of devices . Stated when medically stable for discharge plan is home no services . Will continue to assess and document as more information becomes available .       Electronically signed by Retta Mac Philbin, CM at 08/27/2022  9:06 AM PDT

## (undated) NOTE — Progress Notes (Signed)
 Formatting of this note might be different from the original.  The patient asked to speak with me in person and told me that he was in severe chest pain and insisted on getting morphine.    Plan:   morphine 3 mg IV once  Start gabapentin  CT scan of the chest with contrast stat  Electronically signed by Carren Rang, DO at 08/26/2022  7:34 PM PDT

## (undated) NOTE — ED Notes (Signed)
Formatting of this note might be different from the original.  Pt's case, objective finding, and all current labs/imaging discussed with the hospitalist for admission. They stated they would come see the pt.      Electronically signed by Maudie Flakes., MD at 10/27/2014  5:23 AM EDT

## (undated) NOTE — Telephone Encounter (Signed)
Formatting of this note might be different from the original.  noted  Electronically signed by Pryor Ochoa A at 09/05/2020  1:59 PM EDT

## (undated) NOTE — ED Notes (Signed)
Formatting of this note might be different from the original.  Dr Katrinka Blazing aware of pt's pain level and food provided per pt request and Dr Katrinka Blazing approval.  Electronically signed by Martyn Ehrich, RN at 10/27/2014  4:01 AM EDT

## (undated) NOTE — Unmapped External Note (Signed)
 Formatting of this note might be different from the original.  Pt c/o a stabbing/ squeezing pain in his chest that started about 40 minutes ago. Pt states it is radiating down his left arm, jaw, and back. Per pt he has a cardiac hx.   Electronically signed by Elvera Bicker, RN at 08/26/2022 10:29 AM PDT

## (undated) NOTE — Nursing Note (Signed)
 Formatting of this note might be different from the original.  Report received from ED. Pt admitted into room 180-2 and has been oriented to room and unit. Pt has refused meds and fluids, see MAR. MD Reznikoff notified about non compliance. No signs of distress noted.     2310 pt transported by this nurse via wheelchair to CT scan.     2323 Pt brought back to room via wheelchair from CT scan    2325 Pt resting in bed, breathing is even and unlabored, no signs of distress noted, bed in lowest position, call light within reach, plan of care ongoing.   Electronically signed by Delena Serve, RN at 08/26/2022  9:57 PM PDT

## (undated) NOTE — ED Provider Notes (Signed)
Formatting of this note might be different from the original.  EKG (my reading) - No STEMI, NSR, no st-t changes, normal or unchanged intervals/axis, compared to 07/22/2014 unchanged  - Charyl Dancer, MD      Electronically signed by Corrie Mckusick, MD at 10/27/2014 12:17 AM EDT

## (undated) NOTE — ED Notes (Signed)
Formatting of this note might be different from the original.  Denies any questions regarding discharge.  NAD.  PIV removed, site wnl, dressing/pressure applied, catheter tip intact.  Electronically signed by Martyn Ehrich, RN at 10/27/2014  6:12 AM EDT

## (undated) NOTE — Unmapped External Note (Signed)
 Formatting of this note is different from the original.                                                             PHYSICIAN CLARIFICATION            Dear Dr: Carren Rang, DO  Date: 09/04/2022  Coder/CDS: Delford Field  Phone #:     QUERY    Exercise your independent professional judgment when responding to this query.   Questions asked do not imply that a particular answer is desired or expected. We greatly appreciate your clarification on this issue.    Clinical documentation states:  (H&P by [facility], DO, 08/26/2022):  Nicholas Salas is a 20 y.o. male from home who reports constant for the past several hours very intense left-sided chest pain extending to the jaw, to the thoracic area of the back and to the arm on the left side, which symptom began while the patient was slowly walking.    No other associated symptoms or recent illness. No change of chest pain with rest or change of body position. (Nitroglycerin could not be given in view of reported allergy of the patient to such medication.)  Chest pain, likely non-anginal unspecified, chronic    Coronary artery disease    Previous unspecified heart attacks    Previous coronary artery bypass graft surgery    Dyslipidemia    Obesity    Abnormal (high) serum levels of liver-associated enzymes, likely effect of statin medication and of fatty liver    History of recurrent unspecified syncope    Previous gastric bypass surgery    Habitual tobacco smoking    (Consults by Koleen Nimrod, MD, 08/27/2022):  Chest pain    #1 atypical/noncardiac chest pain.  Patient need to have pain management by pain specialist.  Doubt cardiac origin for that current chest pain.  2.  Hypertension continue scar medication.  3.  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  Patient probably will need nuclear stress test as an outpatient.    Clinical findings show:         08/26/22  08/26/22  08/27/22    HS Troponin I 67 66 69         Based on the clinical  information provided, please specify/select the most appropriate etiology of chest pain:    []  Acute coronary syndrome    []  Coronary artery disease    []  Costochondritis      []  GERD      []  Persistent atrial fibrillation     []  Pleurisy        [x]  Other explanation of clinical findings. Please specify: other chest pain    Present on Admission:  Yes(Y)    Please also document response in your Progress Notes and/or Discharge Summary and indicate if the condition was present on admission.    PATIENT NAME:Nicholas Salas   VW#:[id number]   DOB:March 01, 1976    WGN#:[id number]   AGE/SEX:46 y.o., male   ADMIT DATE:08/26/2022  1:41 PM   DISCHARGE DATE:08/27/2022    Electronically signed by Carren Rang, DO at 09/04/2022  9:59 PM PDT

## (undated) NOTE — Nursing Note (Signed)
 Formatting of this note might be different from the original.  In Pt's chart to help Primary nurse Theron Arista  Electronically signed by Humphrey Rolls, RN at 08/27/2022  6:12 AM PDT

## (undated) NOTE — Telephone Encounter (Signed)
Formatting of this note might be different from the original.  Pt was recently in ER for chest pain, left early.   He has poorly controlled DM   He should have an appointment with me soon.   Electronically signed by Seabron Spates, MD at 08/17/2020  8:44 AM EDT

## (undated) NOTE — Telephone Encounter (Signed)
Formatting of this note might be different from the original.  Called and spoke with Pt's brother.  Pt will not be available until later today, around 6:00 pm.  Will try back later.    Thank you,  Beverlee Nims    Electronically signed by Janie Morning A at 08/25/2020  2:26 PM EDT

## (undated) NOTE — Telephone Encounter (Signed)
Formatting of this note might be different from the original.  Attempted to call Pt to schedule appt.  Pt not available, person who answered phone hung up when asked when Inocente would be available.      Several unsuccessful attempts to reach Pt.  Sending letter requesting Pt call office to schedule appt.    Thank you,  Beverlee Nims    Electronically signed by Janie Morning A at 08/25/2020  6:32 PM EDT

## (undated) NOTE — ED Notes (Signed)
Formatting of this note is different from the original.  RN Assuming Care: 10/27/2014  1:01 AM  Patient Name:  Nicholas Salas Sex:  Male Age:  59yrs  DOB:  06/23/1975  Primary Care Physician:  Pt States Has No Pcp/Ref     History of Present Illness:  [place]: He is here today in the Emergency Dept at [facility]   with the following complaints:   Chief Complaint   Patient presents with   ? Chest Pain     pt. states he has been having CP x 2 hours, states has had a syncopal episode 2 days ago     Pt states that tonight x 1 hour ago, pt reports having left sided chest pain which radiates down the left arm and up in the left jaw and neck. Pt reports that after the chest pain started, he started having blurry vision, and felt like he was going to pass out. Pt states that he then started having the sweats, and his cousin brought him into the ED. Pt states that he had a syncopal episode 4 days ago    Pt Goal for Today: evaluate CP    Vitals: Blood pressure 147/90, pulse 74, temperature 36.9 C (98.4 F), resp. rate 16, SpO2 97 %.                     Allergies:   Allergies   Allergen Reactions   ? Codeine Hives   ? Ibuprofen Hives   ? Ivp Dye [Iodine And Iodide Containing Products]    ? Toradol [Ketorolac]      hives   ? Tramadol      Current Medications:   No current facility-administered medications on file prior to encounter.     Current Outpatient Prescriptions on File Prior to Encounter   Medication Sig Dispense Refill   ? aspirin 81 mg Oral Tablet, Delayed Release (E.C.) Take 81 mg by mouth daily.     ? atorvastatin (LIPITOR) 20 mg Oral Tablet Take 20 mg by mouth daily.     ? HYDROcodone-acetaminophen (NORCO) 10-325 mg Oral Tablet Take 0.5-1 Tabs by mouth every 4 hours as needed for Pain. 3 Tab 0   ? METOPROLOL TARTRATE ORAL Take 50 mg by mouth daily.         Initial RN Eval - History and Physical  Past Medical History:   Past Medical History   Diagnosis Date   ? MI, old    ? Heart palpitations    ?  Hyperlipemia    ? Hypertension      Previous Surgeries:   Past Surgical History   Procedure Laterality Date   ? Sur - coronary artery bypass graft       x 2 procedures   ? Sur - cholecystectomy       6/8/20161:01 AM      Electronically signed by Hulda Marin, RN at 10/27/2014  1:04 AM EDT

---
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
## 2000-01-10 ENCOUNTER — Emergency Department (HOSPITAL_BASED_OUTPATIENT_CLINIC_OR_DEPARTMENT_OTHER): Payer: Self-pay | Admitting: Emergency Medicine

## 2000-01-20 ENCOUNTER — Emergency Department (HOSPITAL_BASED_OUTPATIENT_CLINIC_OR_DEPARTMENT_OTHER): Payer: Self-pay | Admitting: Emergency Medicine

## 2000-02-28 ENCOUNTER — Emergency Department (HOSPITAL_BASED_OUTPATIENT_CLINIC_OR_DEPARTMENT_OTHER): Payer: Self-pay | Admitting: Emergency Medicine

## 2001-05-21 ENCOUNTER — Emergency Department (HOSPITAL_BASED_OUTPATIENT_CLINIC_OR_DEPARTMENT_OTHER): Payer: Self-pay | Admitting: Emergency Medicine

## 2001-05-25 LAB — XR CHEST 2 VIEWS

## 2001-10-07 ENCOUNTER — Emergency Department (HOSPITAL_BASED_OUTPATIENT_CLINIC_OR_DEPARTMENT_OTHER): Payer: Self-pay | Admitting: Emergency Medicine

## 2002-06-26 LAB — LACTATE DEHYDROGENASE: LACTATE DEHYDROGENASE: 168 U/L (ref 94–210)

## 2002-06-26 LAB — BASIC METABOLIC PANEL
BUN (UREA NITROGEN): 13 mg/dl (ref 6–20)
CALCIUM: 9.8 mg/dl (ref 8.4–10.2)
CARBON DIOXIDE: 27 mEQ/L (ref 22–31)
CHLORIDE: 105 mEQ/L (ref 96–106)
CREATININE: 1 mg/dl (ref 0.4–1.2)
Glucose Random: 83 mg/dl (ref 70–110)
POTASSIUM: 4.2 mEQ/L (ref 3.4–5.0)
SODIUM: 143 mEQ/L (ref 134–145)

## 2002-06-26 LAB — APTT: APTT: 27.3 SECONDS (ref 20.0–35.0)

## 2002-06-26 LAB — TROPONIN T, WHIDDEN HOSPITAL: TROPONIN T: <0.01 ng/ml (ref 0.00–0.04)

## 2002-06-26 LAB — BLOOD COUNT COMPLETE AUTO&AUTO DIFRNTL WBC
BASOPHIL %: 1.8 % (ref 0–2)
EOSINOPHIL %: 1 % (ref 0–5)
HEMATOCRIT: 41.2 % (ref 39.8–52.2)
HEMOGLOBIN: 13.9 g/dL (ref 13.5–17.5)
LYMPHOCYTE %: 35.5 % (ref 20–45)
MEAN CORP HGB CONC: 33.8 g/dL (ref 31.5–36.3)
MEAN CORPUSCULAR HGB: 31.5 pg (ref 26.6–33.8)
MEAN CORPUSCULAR VOL: 93.2 fl (ref 80.5–99.7)
MEAN PLATELET VOLUME: 9.9 fL (ref 6.8–12.8)
MONOCYTE %: 7.1 % (ref 0–12)
NEUTROPHIL %: 54.6 % (ref 40–70)
PLATELET COUNT: 298 10*3/uL (ref 130–400)
RBC DISTRIBUTION WIDTH: 11.8 % (ref 11.0–15.0)
RED BLOOD CELL COUNT: 4.42 M/uL (ref 3.80–5.20)
WHITE BLOOD CELL COUNT: 6.1 10*3/uL (ref 4.5–11.0)

## 2002-06-26 LAB — PROTHROMBIN TIME
INR: 1.1 (ref 0.83–1.23)
PROTHROMBIN TIME: 13.2 SECONDS — ABNORMAL HIGH (ref 10.5–13.0)

## 2002-06-26 LAB — URINALYSIS
BACTERIA: <10 PER HPF — ABNORMAL HIGH (ref 0–?)
BILIRUBIN, URINE: NEGATIVE
CASTS: NONE SEEN PER LPF
CRYSTALS: NONE SEEN
GLUCOSE, URINE: NEGATIVE MG/DL
LEUKOCYTE ESTERASE: NEGATIVE
NITRITE, URINE: NEGATIVE
OCCULT BLOOD, URINE: NEGATIVE
PH URINE: 6.5 (ref 5.0–8.0)
PROTEIN, URINE: NEGATIVE MG/DL
RED BLOOD CELLS URINE: NONE SEEN PER HPF (ref 0–2)
SPECIFIC GRAVITY URINE: 1.015 (ref 1.003–1.035)

## 2002-06-26 LAB — TRANSFERASE ASPARTATE AMINO AST SGOT: ASPARTATE AMINOTRANSFERASE: 28 U/L (ref 0–37)

## 2002-06-26 LAB — CK-MB PROFILE
CK INDEX: 0.6 % (ref 0–4)
CKMB: 2.1 ng/ml (ref 0.0–5.0)
CREATINE KINASE TOTAL: 324 IU/L (ref 24–195)

## 2002-06-26 LAB — TRANSFERASE ALANINE AMINO ALT SGPT: ALANINE AMINOTRANSFERASE: 35 U/L (ref 0–40)

## 2002-06-26 LAB — CHOLESTEROL SERUM/WHOLE BLOOD TOTAL: Cholesterol: 225 mg/dl — ABNORMAL HIGH (ref ?–200)

## 2004-11-30 ENCOUNTER — Emergency Department (HOSPITAL_BASED_OUTPATIENT_CLINIC_OR_DEPARTMENT_OTHER): Payer: Self-pay | Admitting: Emergency Medicine

## 2004-11-30 LAB — PROTHROMBIN TIME
INR: 1 — ABNORMAL LOW (ref 2.0–3.5)
PROTHROMBIN TIME: 12.3 SECONDS (ref 11.5–13.5)

## 2004-11-30 LAB — COMPREHENSIVE METABOLIC PANEL
ALANINE AMINOTRANSFERASE: 25 IU/L (ref 10–40)
ALBUMIN: 3.5 g/dl (ref 3.4–4.8)
ALKALINE PHOSPHATASE: 47 IU/L (ref 25–106)
ASPARTATE AMINOTRANSFERASE: 29 IU/L (ref 8–34)
BILIRUBIN TOTAL: 0.9 mg/dl (ref 0.2–1.1)
BUN (UREA NITROGEN): 15 mg/dl (ref 6–20)
CALCIUM: 9.1 mg/dl (ref 8.6–10.0)
CARBON DIOXIDE: 26 mmol/L (ref 22–32)
CHLORIDE: 111 mmol/L (ref 101–111)
CREATININE: 1.2 mg/dl (ref 0.7–1.2)
Glucose Random: 106 mg/dl (ref 74–160)
POTASSIUM: 4.9 mmol/L (ref 3.5–5.1)
SODIUM: 142 mmol/L (ref 135–144)
TOTAL PROTEIN: 5.8 g/dl — ABNORMAL LOW (ref 5.9–7.5)

## 2004-11-30 LAB — BLOOD COUNT COMPLETE AUTO&AUTO DIFRNTL WBC
BASOPHIL %: 0.2 % (ref 0–2)
EOSINOPHIL %: 2 % (ref 0–7)
HEMATOCRIT: 38.4 % — ABNORMAL LOW (ref 42.0–52.0)
HEMOGLOBIN: 13.2 g/dL — ABNORMAL LOW (ref 14.0–18.0)
LYMPHOCYTE %: 35.5 % (ref 12–39)
MEAN CORP HGB CONC: 34.3 g/dL (ref 32.0–36.0)
MEAN CORPUSCULAR HGB: 31.6 pg — ABNORMAL HIGH (ref 27.0–31.0)
MEAN CORPUSCULAR VOL: 92.2 fL (ref 80.0–94.0)
MEAN PLATELET VOLUME: 8.4 fL (ref 6.4–10.8)
MONOCYTE %: 8 % (ref 1–12)
NEUTROPHIL %: 54.3 % (ref 46–79)
PLATELET COUNT: 260 10*3/uL (ref 150–400)
RBC DISTRIBUTION WIDTH: 13.3 % (ref 11.5–14.3)
RED BLOOD CELL COUNT: 4.17 MIL/uL — ABNORMAL LOW (ref 4.70–6.10)
WHITE BLOOD CELL COUNT: 6.9 10*3/uL (ref 4.8–10.8)

## 2004-11-30 LAB — CK-MB PROFILE
CK INDEX: 0.6 %
CKMB: 1.7 ng/ml (ref 0.0–4.0)
CREATINE KINASE TOTAL: 272 IU/L (ref 35–232)

## 2004-11-30 LAB — XR CHEST PORTABLE

## 2004-11-30 LAB — APTT: APTT: 25 SECONDS (ref 22.0–32.8)

## 2004-11-30 LAB — TROPONIN I: TROPONIN I: 0.01 ng/ml (ref 0.00–0.04)

## 2005-05-21 HISTORY — PX: CABG W/ARTERIAL GRAFT SINGLE ARTERIAL GRAFT: PRO0291

## 2005-07-19 ENCOUNTER — Inpatient Hospital Stay (HOSPITAL_BASED_OUTPATIENT_CLINIC_OR_DEPARTMENT_OTHER): Payer: Self-pay | Admitting: Emergency Medicine

## 2005-07-19 LAB — PROTHROMBIN TIME
INR: 1 — ABNORMAL LOW (ref 2.0–3.5)
PROTHROMBIN TIME: 12.5 SECONDS (ref 11.5–13.5)

## 2005-07-19 LAB — DIFFERENTIAL WBC COUNT
ATYPICAL LYMPHOCYTES %: 7 % (ref 0–12)
EOSINOPHILS %: 2 % (ref 0–6)
LYMPHOCYTES %: 34 % (ref 12–40)
MONOCYTES %: 2 % (ref 1–11)
PLATELET ESTIMATE: NORMAL
POLYMORPHONUCLEAR (SEGS) %: 55 % (ref 41–77)

## 2005-07-19 LAB — CK-MB PROFILE
CK INDEX: 0.6 %
CK INDEX: 0.6 %
CKMB: 1.4 ng/ml (ref 0.0–4.0)
CKMB: 1.1 ng/ml (ref 0.0–4.0)
CREATINE KINASE TOTAL: 216 IU/L (ref 35–232)
CREATINE KINASE TOTAL: 197 IU/L (ref 35–232)

## 2005-07-19 LAB — TROPONIN I
TROPONIN I: 0.01 ng/ml (ref 0.00–0.04)
TROPONIN I: 0.01 ng/ml (ref 0.00–0.04)

## 2005-07-19 LAB — COMPREHENSIVE METABOLIC PANEL
ALANINE AMINOTRANSFERASE: 32 IU/L (ref 10–40)
ALBUMIN: 3.9 g/dl (ref 3.4–4.8)
ALKALINE PHOSPHATASE: 58 IU/L (ref 25–106)
ASPARTATE AMINOTRANSFERASE: 25 IU/L (ref 8–34)
BILIRUBIN TOTAL: 0.5 mg/dl (ref 0.2–1.1)
BUN (UREA NITROGEN): 8 mg/dl (ref 6–20)
CALCIUM: 10.2 mg/dl — ABNORMAL HIGH (ref 8.6–10.0)
CARBON DIOXIDE: 29 mmol/L (ref 22–32)
CHLORIDE: 105 mmol/L (ref 98–107)
CREATININE: 1.1 mg/dl (ref 0.7–1.2)
Glucose Random: 89 mg/dl (ref 74–160)
POTASSIUM: 4 mmol/L (ref 3.5–5.1)
SODIUM: 140 mmol/L (ref 135–144)
TOTAL PROTEIN: 6.7 g/dl (ref 5.9–7.5)

## 2005-07-19 LAB — BLOOD COUNT COMPLETE AUTOMATED
HEMATOCRIT: 43.4 % (ref 42.0–52.0)
HEMOGLOBIN: 14.3 g/dL (ref 14.0–18.0)
MEAN CORP HGB CONC: 32.8 g/dL (ref 32.0–36.0)
MEAN CORPUSCULAR HGB: 30.6 pg (ref 27.0–31.0)
MEAN CORPUSCULAR VOL: 93.1 fL (ref 80.0–94.0)
MEAN PLATELET VOLUME: 7.6 fL (ref 6.4–10.8)
PLATELET COUNT: 375 10*3/uL (ref 150–400)
RBC DISTRIBUTION WIDTH: 13.1 % (ref 11.5–14.3)
RED BLOOD CELL COUNT: 4.67 MIL/uL — ABNORMAL LOW (ref 4.70–6.10)
WHITE BLOOD CELL COUNT: 7.6 10*3/uL (ref 4.8–10.8)

## 2005-07-19 LAB — APTT: APTT: 26.5 SECONDS (ref 22.0–32.8)

## 2005-07-19 LAB — ~~LOC~~-HISTORY & PHYSICAL

## 2005-07-20 LAB — CK-MB PROFILE
CK INDEX: 0.7 %
CKMB: 1.4 ng/ml (ref 0.0–4.0)
CREATINE KINASE TOTAL: 190 IU/L (ref 35–232)

## 2005-07-20 LAB — EKG

## 2005-07-20 LAB — XR CHEST PORTABLE

## 2005-07-20 LAB — TROPONIN I: TROPONIN I: 0.01 ng/ml (ref 0.00–0.04)

## 2005-07-23 LAB — ~~LOC~~-CONSULTATION REPORT

## 2005-08-09 ENCOUNTER — Encounter (HOSPITAL_BASED_OUTPATIENT_CLINIC_OR_DEPARTMENT_OTHER): Payer: Self-pay | Admitting: Internal Medicine

## 2005-11-20 ENCOUNTER — Encounter (HOSPITAL_BASED_OUTPATIENT_CLINIC_OR_DEPARTMENT_OTHER): Payer: Self-pay | Admitting: Family Medicine

## 2005-12-29 ENCOUNTER — Inpatient Hospital Stay (HOSPITAL_BASED_OUTPATIENT_CLINIC_OR_DEPARTMENT_OTHER): Payer: Self-pay | Admitting: Emergency Medicine

## 2005-12-30 LAB — BLOOD COUNT COMPLETE AUTO&AUTO DIFRNTL WBC
BASOPHIL %: 0.4 % (ref 0–2)
EOSINOPHIL %: 3 % (ref 0–7)
HEMATOCRIT: 40.3 % — ABNORMAL LOW (ref 42.0–52.0)
HEMOGLOBIN: 13.2 g/dL — ABNORMAL LOW (ref 14.0–18.0)
LYMPHOCYTE %: 25.9 % (ref 12–39)
MEAN CORP HGB CONC: 32.7 g/dL (ref 32.0–36.0)
MEAN CORPUSCULAR HGB: 29.3 pg (ref 27.0–31.0)
MEAN CORPUSCULAR VOL: 89.6 fL (ref 80.0–94.0)
MEAN PLATELET VOLUME: 8 fL (ref 6.4–10.8)
MONOCYTE %: 4.3 % (ref 1–12)
NEUTROPHIL %: 66.4 % (ref 46–79)
PLATELET COUNT: 387 10*3/uL (ref 150–400)
RBC DISTRIBUTION WIDTH: 13.7 % (ref 11.5–14.3)
RED BLOOD CELL COUNT: 4.5 MIL/uL — ABNORMAL LOW (ref 4.70–6.10)
WHITE BLOOD CELL COUNT: 6.9 10*3/uL (ref 4.8–10.8)

## 2005-12-30 LAB — CK-MB PROFILE
CK INDEX: 0.9 %
CK INDEX: 1 %
CK INDEX: 1.1 %
CKMB: 1.1 ng/mL (ref 0.0–4.0)
CKMB: 1.2 ng/mL (ref 0.0–4.0)
CKMB: 1.3 ng/mL (ref 0.0–4.0)
CREATINE KINASE TOTAL: 124 IU/L (ref 35–232)
CREATINE KINASE TOTAL: 123 IU/L (ref 35–232)
CREATINE KINASE TOTAL: 117 IU/L (ref 35–232)

## 2005-12-30 LAB — TROPONIN I
TROPONIN I: 0.03 ng/mL (ref 0.00–0.04)
TROPONIN I: 0.04 ng/mL (ref 0.00–0.04)
TROPONIN I: 0.02 ng/mL (ref 0.00–0.04)

## 2005-12-30 LAB — COMPREHENSIVE METABOLIC PANEL
ALANINE AMINOTRANSFERASE: 95 IU/L — ABNORMAL HIGH (ref 10–40)
ALBUMIN: 3.6 g/dl (ref 3.4–4.8)
ALKALINE PHOSPHATASE: 68 IU/L (ref 25–106)
ANION GAP: 5 mmol/L (ref 2–25)
ASPARTATE AMINOTRANSFERASE: 68 IU/L — ABNORMAL HIGH (ref 8–34)
BILIRUBIN TOTAL: 0.4 mg/dl (ref 0.2–1.1)
BUN (UREA NITROGEN): 15 mg/dl (ref 6–20)
CALCIUM: 9.9 mg/dl (ref 8.6–10.0)
CARBON DIOXIDE: 31 mmol/L (ref 22–32)
CHLORIDE: 108 mmol/L (ref 101–111)
CREATININE: 1.1 mg/dl (ref 0.7–1.2)
Glucose Random: 97 mg/dl (ref 74–160)
POTASSIUM: 4.3 mmol/L (ref 3.5–5.1)
SODIUM: 144 mmol/L (ref 135–144)
TOTAL PROTEIN: 6.8 g/dl (ref 5.9–7.5)

## 2005-12-30 LAB — BLOOD COUNT COMPLETE AUTOMATED
HEMATOCRIT: 39.5 % — ABNORMAL LOW (ref 42.0–52.0)
HEMOGLOBIN: 12.9 g/dL — ABNORMAL LOW (ref 14.0–18.0)
MEAN CORP HGB CONC: 32.8 g/dL (ref 32.0–36.0)
MEAN CORPUSCULAR HGB: 29.6 pg (ref 27.0–31.0)
MEAN CORPUSCULAR VOL: 90.4 fL (ref 80.0–94.0)
MEAN PLATELET VOLUME: 7.9 fL (ref 6.4–10.8)
PLATELET COUNT: 356 10*3/uL (ref 150–400)
RBC DISTRIBUTION WIDTH: 13.6 % (ref 11.5–14.3)
RED BLOOD CELL COUNT: 4.37 MIL/uL — ABNORMAL LOW (ref 4.70–6.10)
WHITE BLOOD CELL COUNT: 6.4 10*3/uL (ref 4.8–10.8)

## 2005-12-30 LAB — BASIC METABOLIC PANEL
ANION GAP: 8 mmol/L (ref 2–25)
BUN (UREA NITROGEN): 12 mg/dl (ref 6–20)
CALCIUM: 9.7 mg/dl (ref 8.6–10.0)
CARBON DIOXIDE: 28 mmol/L (ref 22–32)
CHLORIDE: 104 mmol/L (ref 101–111)
CREATININE: 0.9 mg/dl (ref 0.7–1.2)
Glucose Random: 94 mg/dl (ref 74–160)
POTASSIUM: 4.4 mmol/L (ref 3.5–5.1)
SODIUM: 140 mmol/L (ref 135–144)

## 2005-12-30 LAB — PROTHROMBIN TIME
INR: 1 — ABNORMAL LOW (ref 2.0–3.5)
PROTHROMBIN TIME: 12.6 SECONDS (ref 11.5–13.5)

## 2005-12-30 LAB — URINE DRUG SCREEN 7 DRUGS
AMPHETAMINES URINE: NEGATIVE
BARBITURATES URINE: POSITIVE — AB
BENZODIAZEPINES URINE: NEGATIVE
CANNABINOIDS URINE: NEGATIVE
COCAINE METABOLITES URINE: NEGATIVE
ETHANOL URINE: NEGATIVE
OPIATES URINE: POSITIVE — AB

## 2005-12-30 LAB — EMERGENCY ROOM NOTE

## 2005-12-30 LAB — APTT: APTT: 23.5 SECONDS (ref 22.0–32.8)

## 2005-12-30 LAB — H&P

## 2005-12-30 LAB — XR CHEST PORTABLE

## 2005-12-31 LAB — EKG-12 ** TO BE DONE BY EKG **

## 2005-12-31 LAB — EKG

## 2005-12-31 LAB — CONSULTATION

## 2006-01-02 LAB — CONSULTATION

## 2006-01-15 LAB — DISCHARGE SUMMARY

## 2006-07-20 ENCOUNTER — Emergency Department (HOSPITAL_BASED_OUTPATIENT_CLINIC_OR_DEPARTMENT_OTHER): Payer: Self-pay | Admitting: Emergency Medicine

## 2006-07-20 LAB — COMPREHENSIVE METABOLIC PANEL
ALANINE AMINOTRANSFERASE: 39 IU/L (ref 10–40)
ALBUMIN: 3.7 g/dl (ref 3.4–4.8)
ALKALINE PHOSPHATASE: 57 IU/L (ref 25–106)
ANION GAP: 10 mmol/L (ref 2–25)
ASPARTATE AMINOTRANSFERASE: 31 IU/L (ref 8–34)
BILIRUBIN TOTAL: 0.6 mg/dl (ref 0.2–1.1)
BUN (UREA NITROGEN): 12 mg/dl (ref 6–20)
CALCIUM: 9 mg/dl (ref 8.6–10.0)
CARBON DIOXIDE: 24 mmol/L (ref 22–32)
CHLORIDE: 105 mmol/L (ref 101–111)
CREATININE: 1.2 mg/dl (ref 0.7–1.2)
Glucose Random: 123 mg/dl (ref 74–160)
POTASSIUM: 3.9 mmol/L (ref 3.5–5.1)
SODIUM: 139 mmol/L (ref 135–144)
TOTAL PROTEIN: 6.6 g/dl (ref 5.9–7.5)

## 2006-07-20 LAB — BLOOD COUNT COMPLETE AUTO&AUTO DIFRNTL WBC
BASOPHIL %: 0.6 % (ref 0.0–2.0)
EOSINOPHIL %: 4.1 % (ref 0.0–7.0)
HEMATOCRIT: 40.4 % — ABNORMAL LOW (ref 42.0–54.0)
HEMOGLOBIN: 13.9 g/dl — ABNORMAL LOW (ref 14.0–18.0)
LYMPHOCYTE %: 40.2 % — ABNORMAL HIGH (ref 13.0–39.0)
MEAN CORP HGB CONC: 34.4 g/dl (ref 32.0–36.0)
MEAN CORPUSCULAR HGB: 31.3 pg (ref 27.0–33.0)
MEAN CORPUSCULAR VOL: 91.1 fl (ref 80.0–100.0)
MEAN PLATELET VOLUME: 8.1 fl (ref 6.4–10.8)
MONOCYTE %: 10.9 % (ref 1.0–12.0)
NEUTROPHIL %: 44.2 % — ABNORMAL LOW (ref 46.0–79.0)
PLATELET COUNT: 298 10*3/uL (ref 150–400)
RBC DISTRIBUTION WIDTH: 14.6 % — ABNORMAL HIGH (ref 11.5–14.3)
RED BLOOD CELL COUNT: 4.44 M/uL — ABNORMAL LOW (ref 4.50–6.10)
WHITE BLOOD CELL COUNT: 4.7 10*3/uL (ref 4.0–10.8)

## 2006-07-20 LAB — CK-MB PROFILE
CK INDEX: 0.6 %
CKMB: 0.9 ng/mL (ref 0.0–4.0)
CREATINE KINASE TOTAL: 155 IU/L (ref 35–232)

## 2006-07-20 LAB — PROTHROMBIN TIME
INR: 1 — ABNORMAL LOW (ref 2.0–3.5)
PROTHROMBIN TIME: 10.7 SECONDS (ref 9.5–11.5)

## 2006-07-20 LAB — SERUM DRUG SCREEN 6 DRUGS
ACETAMINOPHEN: <10 ug/ml — ABNORMAL LOW (ref 10–30)
BARBITURATE: NEGATIVE
BENZODIAZEPINE: NEGATIVE
ETHANOL: <10 mg/dl (ref <10)
SALICYLATE: <4 mg/dl — ABNORMAL LOW (ref 4–29)
TRICYCLIC ANTIDEPRESSANTS: NEGATIVE

## 2006-07-20 LAB — URINE DRUG SCREEN 7 DRUGS
AMPHETAMINES URINE: NEGATIVE
BARBITURATES URINE: NEGATIVE
BENZODIAZEPINES URINE: NEGATIVE
CANNABINOIDS URINE: NEGATIVE
COCAINE METABOLITES URINE: NEGATIVE
ETHANOL URINE: NEGATIVE
OPIATES URINE: NEGATIVE

## 2006-07-20 LAB — TROPONIN I: TROPONIN I: 0.01 ng/mL (ref 0.00–0.04)

## 2006-07-20 LAB — APTT: APTT: 27.8 SECONDS (ref 23.7–33.3)

## 2006-07-21 LAB — EMERGENCY ROOM NOTE

## 2006-07-21 LAB — XR CHEST PORTABLE

## 2006-07-21 LAB — CT CHEST WO CONTRAST

## 2006-07-21 LAB — EKG

## 2006-08-31 ENCOUNTER — Emergency Department (HOSPITAL_BASED_OUTPATIENT_CLINIC_OR_DEPARTMENT_OTHER): Payer: Self-pay | Admitting: Emergency Medicine

## 2006-09-01 LAB — EMERGENCY ROOM NOTE

## 2006-09-01 LAB — XR CHEST 2 VIEWS

## 2006-09-03 LAB — EKG

## 2006-10-24 LAB — COMPREHENSIVE METABOLIC PANEL
ALANINE AMINOTRANSFERASE: 25 IU/L (ref 10–40)
ALBUMIN: 4.4 g/dl (ref 3.4–4.8)
ALKALINE PHOSPHATASE: 63 IU/L (ref 25–106)
ANION GAP: 10 mmol/L (ref 2–25)
ASPARTATE AMINOTRANSFERASE: 25 IU/L (ref 8–34)
BILIRUBIN TOTAL: 0.5 mg/dl (ref 0.2–1.1)
BUN (UREA NITROGEN): 9 mg/dl (ref 6–20)
CALCIUM: 9.7 mg/dl (ref 8.6–10.0)
CARBON DIOXIDE: 30 mmol/L (ref 22–32)
CHLORIDE: 102 mmol/L (ref 101–111)
CREATININE: 1.1 mg/dl (ref 0.7–1.2)
ESTIMATED GLOMERULAR FILT RATE: >60 mL/min (ref 60–116)
Glucose Random: 76 mg/dl (ref 74–160)
POTASSIUM: 4 mmol/L (ref 3.5–5.1)
SODIUM: 142 mmol/L (ref 135–144)
TOTAL PROTEIN: 7.7 g/dl — ABNORMAL HIGH (ref 5.9–7.5)

## 2006-10-24 LAB — BLOOD COUNT COMPLETE AUTO&AUTO DIFRNTL WBC
BASOPHIL %: 0.2 % (ref 0.0–2.0)
EOSINOPHIL %: 1.2 % (ref 0.0–7.0)
HEMATOCRIT: 41.9 % — ABNORMAL LOW (ref 42.0–54.0)
HEMOGLOBIN: 13.9 g/dl — ABNORMAL LOW (ref 14.0–18.0)
LYMPHOCYTE %: 18.6 % (ref 13.0–39.0)
MEAN CORP HGB CONC: 33.3 g/dl (ref 32.0–36.0)
MEAN CORPUSCULAR HGB: 30.5 pg (ref 27.0–33.0)
MEAN CORPUSCULAR VOL: 91.7 fl (ref 80.0–100.0)
MEAN PLATELET VOLUME: 7.9 fl (ref 6.4–10.8)
MONOCYTE %: 3 % (ref 1.0–12.0)
NEUTROPHIL %: 77 % (ref 46.0–79.0)
PLATELET COUNT: 313 10*3/uL (ref 150–400)
RBC DISTRIBUTION WIDTH: 13.9 % (ref 11.5–14.3)
RED BLOOD CELL COUNT: 4.57 M/uL (ref 4.50–6.10)
WHITE BLOOD CELL COUNT: 7.3 10*3/uL (ref 4.0–10.8)

## 2006-10-24 LAB — PROTHROMBIN TIME
INR: 1.1 — ABNORMAL LOW (ref 2.0–3.5)
PROTHROMBIN TIME: 11.3 SECONDS (ref 9.5–11.5)

## 2006-10-24 LAB — URINE DRUG SCREEN 7 DRUGS
AMPHETAMINES URINE: NEGATIVE
BARBITURATES URINE: NEGATIVE
BENZODIAZEPINES URINE: NEGATIVE
CANNABINOIDS URINE: NEGATIVE
COCAINE METABOLITES URINE: NEGATIVE
ETHANOL URINE: NEGATIVE
OPIATES URINE: POSITIVE — AB

## 2006-10-24 LAB — APTT: APTT: 28.2 SECONDS (ref 23.7–33.3)

## 2006-10-24 LAB — TROPONIN I: TROPONIN I: 0.05 ng/mL — ABNORMAL HIGH (ref 0.00–0.04)

## 2006-10-24 LAB — CK-MB PROFILE
CK INDEX: 0.9 %
CKMB: 2.8 ng/mL (ref 0.0–4.0)
CREATINE KINASE TOTAL: 322 IU/L (ref 35–232)

## 2006-10-25 ENCOUNTER — Inpatient Hospital Stay (HOSPITAL_BASED_OUTPATIENT_CLINIC_OR_DEPARTMENT_OTHER)
Admission: RE | Admit: 2006-10-25 | Disposition: A | Discharge status: Home / Self Care | Payer: Self-pay | Source: Emergency Department | Attending: Emergency Medicine | Admitting: Emergency Medicine

## 2006-10-25 LAB — BLOOD COUNT COMPLETE AUTOMATED
HEMATOCRIT: 39.2 % — ABNORMAL LOW (ref 42.0–54.0)
HEMOGLOBIN: 13 g/dl — ABNORMAL LOW (ref 14.0–18.0)
MEAN CORP HGB CONC: 33.1 g/dl (ref 32.0–36.0)
MEAN CORPUSCULAR HGB: 29.9 pg (ref 27.0–33.0)
MEAN CORPUSCULAR VOL: 90.2 fl (ref 80.0–100.0)
MEAN PLATELET VOLUME: 7.7 fl (ref 6.4–10.8)
PLATELET COUNT: 337 10*3/uL (ref 150–400)
RBC DISTRIBUTION WIDTH: 13.7 % (ref 11.5–14.3)
RED BLOOD CELL COUNT: 4.34 M/uL — ABNORMAL LOW (ref 4.50–6.10)
WHITE BLOOD CELL COUNT: 4.6 10*3/uL (ref 4.0–10.8)

## 2006-10-25 LAB — BASIC METABOLIC PANEL
ANION GAP: 6 mmol/L (ref 2–25)
BUN (UREA NITROGEN): 11 mg/dl (ref 6–20)
CALCIUM: 9.5 mg/dl (ref 8.6–10.0)
CARBON DIOXIDE: 28 mmol/L (ref 22–32)
CHLORIDE: 106 mmol/L (ref 101–111)
CREATININE: 1.1 mg/dl (ref 0.7–1.2)
ESTIMATED GLOMERULAR FILT RATE: >60 mL/min (ref 60–116)
Glucose Random: 83 mg/dl (ref 74–160)
POTASSIUM: 4.2 mmol/L (ref 3.5–5.1)
SODIUM: 140 mmol/L (ref 135–144)

## 2006-10-25 LAB — CK-MB PROFILE
CK INDEX: 0.7 %
CKMB: 2 ng/mL (ref 0.0–4.0)
CREATINE KINASE TOTAL: 285 IU/L (ref 35–232)

## 2006-10-25 LAB — TROPONIN I: TROPONIN I: 0.02 ng/mL (ref 0.00–0.04)

## 2006-10-25 LAB — XR CHEST PORTABLE

## 2006-10-26 LAB — H&P

## 2006-10-27 LAB — EKG

## 2006-10-28 LAB — EKG-12 ** TO BE DONE BY EKG **

## 2006-10-31 LAB — BLOOD CULTURE ADULT
AEROBIC BOTTLE, BLOOD CULTURE: NO GROWTH
ANAEROBIC BTL, BLOOD CULTURE: NO GROWTH

## 2006-10-31 LAB — EMERGENCY ROOM NOTE

## 2006-11-12 ENCOUNTER — Ambulatory Visit (HOSPITAL_BASED_OUTPATIENT_CLINIC_OR_DEPARTMENT_OTHER): Payer: Self-pay | Admitting: Internal Medicine

## 2006-12-03 ENCOUNTER — Emergency Department (HOSPITAL_BASED_OUTPATIENT_CLINIC_OR_DEPARTMENT_OTHER)
Admission: RE | Admit: 2006-12-03 | Discharge status: Against Medical Advice | Payer: Self-pay | Source: Emergency Department | Attending: Emergency Medicine | Admitting: Emergency Medicine

## 2006-12-04 LAB — URINE DRUG SCREEN 7 DRUGS
AMPHETAMINES URINE: NEGATIVE
BARBITURATES URINE: NEGATIVE
BENZODIAZEPINES URINE: NEGATIVE
CANNABINOIDS URINE: NEGATIVE
COCAINE METABOLITES URINE: NEGATIVE
ETHANOL URINE: NEGATIVE
OPIATES URINE: POSITIVE — AB

## 2006-12-04 LAB — BLOOD COUNT COMPLETE AUTO&AUTO DIFRNTL WBC
BASOPHIL %: 2.1 % — ABNORMAL HIGH (ref 0.0–2.0)
EOSINOPHIL %: 5.7 % (ref 0.0–7.0)
HEMATOCRIT: 39.2 % — ABNORMAL LOW (ref 42.0–54.0)
HEMOGLOBIN: 12.9 g/dl — ABNORMAL LOW (ref 14.0–18.0)
LYMPHOCYTE %: 35.3 % (ref 13.0–39.0)
MEAN CORP HGB CONC: 33 g/dl (ref 32.0–36.0)
MEAN CORPUSCULAR HGB: 29.9 pg (ref 27.0–33.0)
MEAN CORPUSCULAR VOL: 90.6 fl (ref 80.0–100.0)
MEAN PLATELET VOLUME: 7.7 fl (ref 6.4–10.8)
MONOCYTE %: 7 % (ref 1.0–12.0)
NEUTROPHIL %: 49.9 % (ref 46.0–79.0)
PLATELET COUNT: 290 10*3/uL (ref 150–400)
RBC DISTRIBUTION WIDTH: 14.3 % (ref 11.5–14.3)
RED BLOOD CELL COUNT: 4.33 M/uL — ABNORMAL LOW (ref 4.50–6.10)
WHITE BLOOD CELL COUNT: 5.7 10*3/uL (ref 4.0–10.8)

## 2006-12-04 LAB — TROPONIN I: TROPONIN I: 0.01 ng/mL (ref 0.00–0.04)

## 2006-12-04 LAB — PROTHROMBIN TIME
INR: 1 — ABNORMAL LOW (ref 2.0–3.5)
PROTHROMBIN TIME: 10.4 SECONDS (ref 9.5–11.5)

## 2006-12-04 LAB — COMPREHENSIVE METABOLIC PANEL
ALANINE AMINOTRANSFERASE: 27 IU/L (ref 10–40)
ALBUMIN: 4 g/dl (ref 3.4–4.8)
ALKALINE PHOSPHATASE: 52 IU/L (ref 25–106)
ANION GAP: 9 mmol/L (ref 2–25)
ASPARTATE AMINOTRANSFERASE: 22 IU/L (ref 8–34)
BILIRUBIN TOTAL: 0.6 mg/dl (ref 0.2–1.1)
BUN (UREA NITROGEN): 16 mg/dl (ref 6–20)
CALCIUM: 9.7 mg/dl (ref 8.6–10.0)
CARBON DIOXIDE: 22 mmol/L (ref 22–32)
CHLORIDE: 106 mmol/L (ref 101–111)
CREATININE: 0.9 mg/dl (ref 0.7–1.2)
ESTIMATED GLOMERULAR FILT RATE: >60 mL/min (ref 60–116)
Glucose Random: 93 mg/dl (ref 74–160)
POTASSIUM: 4 mmol/L (ref 3.5–5.1)
SODIUM: 137 mmol/L (ref 135–144)
TOTAL PROTEIN: 7 g/dl (ref 5.9–7.5)

## 2006-12-04 LAB — SERUM DRUG SCREEN 6 DRUGS
ACETAMINOPHEN: <10 ug/ml — ABNORMAL LOW (ref 10–30)
BARBITURATE: NEGATIVE
BENZODIAZEPINE: NEGATIVE
ETHANOL: <10 mg/dl (ref <10)
SALICYLATE: <4 mg/dl — ABNORMAL LOW (ref 4–29)
TRICYCLIC ANTIDEPRESSANTS: NEGATIVE

## 2006-12-04 LAB — CK-MB PROFILE
CK INDEX: 1 %
CKMB: 1.8 ng/mL (ref 0.0–4.0)
CREATINE KINASE TOTAL: 177 IU/L (ref 35–232)

## 2006-12-04 LAB — APTT: APTT: 26.4 SECONDS (ref 23.7–33.3)

## 2006-12-04 LAB — XR CHEST PORTABLE

## 2006-12-04 LAB — EKG

## 2006-12-07 LAB — EMERGENCY ROOM NOTE

## 2007-01-13 ENCOUNTER — Emergency Department (HOSPITAL_BASED_OUTPATIENT_CLINIC_OR_DEPARTMENT_OTHER)
Admission: RE | Admit: 2007-01-13 | Discharge status: Against Medical Advice | Payer: Self-pay | Source: Emergency Department | Attending: Emergency Medicine | Admitting: Emergency Medicine

## 2007-01-14 LAB — XR CHEST PORTABLE

## 2007-01-14 LAB — EKG

## 2007-01-17 LAB — EMERGENCY ROOM NOTE

## 2007-02-10 DIAGNOSIS — Q245 Malformation of coronary vessels: Secondary | ICD-10-CM | POA: Insufficient documentation

## 2007-02-10 DIAGNOSIS — Z951 Presence of aortocoronary bypass graft: Secondary | ICD-10-CM | POA: Insufficient documentation

## 2007-03-15 ENCOUNTER — Inpatient Hospital Stay (HOSPITAL_BASED_OUTPATIENT_CLINIC_OR_DEPARTMENT_OTHER)
Admission: RE | Admit: 2007-03-15 | Discharge status: Against Medical Advice | Payer: Self-pay | Source: Emergency Department | Attending: Emergency Medicine | Admitting: Emergency Medicine

## 2007-03-15 ENCOUNTER — Encounter (HOSPITAL_BASED_OUTPATIENT_CLINIC_OR_DEPARTMENT_OTHER): Payer: Self-pay

## 2007-03-15 HISTORY — DX: Pure hypercholesterolemia, unspecified: E78.00

## 2007-03-15 HISTORY — DX: Nonrheumatic mitral (valve) prolapse: I34.1

## 2007-03-15 HISTORY — DX: Atherosclerotic heart disease of native coronary artery without angina pectoris: I25.10

## 2007-03-15 HISTORY — DX: Acute myocardial infarction, unspecified: I21.9

## 2007-03-15 LAB — COMPREHENSIVE METABOLIC PANEL
ALANINE AMINOTRANSFERASE: 22 IU/L (ref 10–40)
ALBUMIN: 4 g/dl (ref 3.4–4.8)
ALKALINE PHOSPHATASE: 51 IU/L (ref 25–106)
ANION GAP: 9 mmol/L (ref 2–25)
ASPARTATE AMINOTRANSFERASE: 22 IU/L (ref 8–34)
BILIRUBIN TOTAL: 0.8 mg/dl (ref 0.2–1.1)
BUN (UREA NITROGEN): 11 mg/dl (ref 6–20)
CALCIUM: 9.9 mg/dl (ref 8.6–10.0)
CARBON DIOXIDE: 27 mmol/L (ref 22–32)
CHLORIDE: 101 mmol/L (ref 101–111)
CREATININE: 1 mg/dl (ref 0.7–1.2)
ESTIMATED GLOMERULAR FILT RATE: >60 mL/min (ref 60–116)
Glucose Random: 89 mg/dl (ref 74–160)
POTASSIUM: 4.3 mmol/L (ref 3.5–5.1)
SODIUM: 137 mmol/L (ref 135–144)
TOTAL PROTEIN: 6.8 g/dl (ref 5.9–7.5)

## 2007-03-15 LAB — BLOOD COUNT COMPLETE AUTO&AUTO DIFRNTL WBC
BASOPHIL %: 0.5 % (ref 0.0–2.0)
EOSINOPHIL %: 1.9 % (ref 0.0–7.0)
HEMATOCRIT: 40.2 % — ABNORMAL LOW (ref 42.0–54.0)
HEMOGLOBIN: 13.8 g/dl — ABNORMAL LOW (ref 14.0–18.0)
LYMPHOCYTE %: 34 % (ref 13.0–39.0)
MEAN CORP HGB CONC: 34.4 g/dl (ref 32.0–36.0)
MEAN CORPUSCULAR HGB: 31 pg (ref 27.0–33.0)
MEAN CORPUSCULAR VOL: 90.1 fl (ref 80.0–100.0)
MEAN PLATELET VOLUME: 8 fl (ref 6.4–10.8)
MONOCYTE %: 7.6 % (ref 1.0–12.0)
NEUTROPHIL %: 56 % (ref 46.0–79.0)
PLATELET COUNT: 258 10*3/uL (ref 150–400)
RBC DISTRIBUTION WIDTH: 13.8 % (ref 11.5–14.3)
RED BLOOD CELL COUNT: 4.46 M/uL — ABNORMAL LOW (ref 4.50–6.10)
WHITE BLOOD CELL COUNT: 5 10*3/uL (ref 4.0–10.8)

## 2007-03-15 LAB — XR CHEST PORTABLE

## 2007-03-15 LAB — URINE DRUG SCREEN 7 DRUGS
AMPHETAMINES URINE: NEGATIVE
BARBITURATES URINE: NEGATIVE
BENZODIAZEPINES URINE: NEGATIVE
CANNABINOIDS URINE: NEGATIVE
COCAINE METABOLITES URINE: NEGATIVE
ETHANOL URINE: NEGATIVE
OPIATES URINE: POSITIVE — AB

## 2007-03-15 LAB — CK-MB PROFILE
CK INDEX: 0.6 %
CKMB: 1.6 ng/mL (ref 0.0–4.0)
CREATINE KINASE TOTAL: 251 IU/L (ref 35–232)

## 2007-03-15 LAB — PROTHROMBIN TIME
INR: 1 — ABNORMAL LOW (ref 2.0–3.5)
PROTHROMBIN TIME: 10.5 SECONDS (ref 9.5–11.5)

## 2007-03-15 LAB — APTT: APTT: 26.7 SECONDS (ref 23.7–33.3)

## 2007-03-15 LAB — TROPONIN I: TROPONIN I: 0.06 ng/mL — ABNORMAL HIGH (ref 0.00–0.04)

## 2007-03-15 NOTE — ED Notes (Signed)
<  body>  <div align="left"><font face="Arial"><span style="font-size:12pt">Pt arrives via EMS with c/o of crushing left anterior chest pain radiating to L arm   and L jaw started approx. 30 minutues prior to arrival.&nbsp; Pt reports episode of   nausea (resolved).&nbsp; Well-appering pt. In no distress on arrival to ER.</span></font></div>  </body>

## 2007-03-17 LAB — EKG

## 2007-03-30 LAB — H&P

## 2007-04-03 LAB — EMERGENCY ROOM NOTE

## 2007-06-29 ENCOUNTER — Encounter (HOSPITAL_BASED_OUTPATIENT_CLINIC_OR_DEPARTMENT_OTHER): Payer: Self-pay

## 2007-06-29 LAB — BLOOD COUNT COMPLETE AUTO&AUTO DIFRNTL WBC
BASOPHIL %: 0.4 % (ref 0.0–2.0)
EOSINOPHIL %: 2.1 % (ref 0.0–7.0)
HEMATOCRIT: 40.3 % — ABNORMAL LOW (ref 42.0–54.0)
HEMOGLOBIN: 13.3 g/dl — ABNORMAL LOW (ref 14.0–18.0)
LYMPHOCYTE %: 32.9 % (ref 13.0–39.0)
MEAN CORP HGB CONC: 33 g/dl (ref 32.0–36.0)
MEAN CORPUSCULAR HGB: 31.9 pg (ref 27.0–33.0)
MEAN CORPUSCULAR VOL: 96.6 fl (ref 80.0–100.0)
MEAN PLATELET VOLUME: 7.9 fl (ref 6.4–10.8)
MONOCYTE %: 5.6 % (ref 1.0–12.0)
NEUTROPHIL %: 59 % (ref 46.0–79.0)
PLATELET COUNT: 266 10*3/uL (ref 150–400)
RBC DISTRIBUTION WIDTH: 13.6 % (ref 11.5–14.3)
RED BLOOD CELL COUNT: 4.17 M/uL — ABNORMAL LOW (ref 4.50–6.10)
WHITE BLOOD CELL COUNT: 7 10*3/uL (ref 4.0–10.8)

## 2007-06-29 LAB — PROTHROMBIN TIME
INR: 1 — ABNORMAL LOW (ref 2.0–3.5)
PROTHROMBIN TIME: 10.5 SECONDS (ref 9.5–11.5)

## 2007-06-29 LAB — COMPREHENSIVE METABOLIC PANEL
ALANINE AMINOTRANSFERASE: 34 IU/L (ref 10–40)
ALBUMIN: 3.9 g/dl (ref 3.4–4.8)
ALKALINE PHOSPHATASE: 48 IU/L (ref 25–106)
ANION GAP: 13 mmol/L (ref 2–25)
ASPARTATE AMINOTRANSFERASE: 25 IU/L (ref 8–34)
BILIRUBIN TOTAL: 0.6 mg/dl (ref 0.2–1.1)
BUN (UREA NITROGEN): 14 mg/dl (ref 6–20)
CALCIUM: 9.9 mg/dl (ref 8.6–10.0)
CARBON DIOXIDE: 27 mmol/L (ref 22–32)
CHLORIDE: 99 mmol/L — ABNORMAL LOW (ref 101–111)
CREATININE: 1.1 mg/dl (ref 0.7–1.2)
ESTIMATED GLOMERULAR FILT RATE: >60 mL/min (ref 60–116)
Glucose Random: 90 mg/dl (ref 74–160)
POTASSIUM: 4.1 mmol/L (ref 3.5–5.1)
SODIUM: 139 mmol/L (ref 135–144)
TOTAL PROTEIN: 6.7 g/dl (ref 5.9–7.5)

## 2007-06-29 LAB — HOLD PURPLE TOP TUBE

## 2007-06-29 LAB — CK-MB PROFILE
CK INDEX: 1 %
CKMB: 2.1 ng/mL (ref 0.0–4.0)
CREATINE KINASE TOTAL: 202 IU/L (ref 35–232)

## 2007-06-29 LAB — APTT: APTT: 25.6 SECONDS (ref 23.7–33.3)

## 2007-06-29 LAB — TROPONIN I: TROPONIN I: 0.04 ng/mL (ref 0.00–0.04)

## 2007-06-29 NOTE — ED Notes (Signed)
Pt arrives with c/o chest pain which began 15 minutes prior toa rrival.  There is no diaphoresis, no EKG changes on route.  In the ambulance, he had 4 baby aspirins and 3 0.4 nitroglycerin sprays.  He had a stable BP throughout and his pain decreased from a 10 to an 8 as a result of the nitro.

## 2007-06-30 ENCOUNTER — Inpatient Hospital Stay (HOSPITAL_BASED_OUTPATIENT_CLINIC_OR_DEPARTMENT_OTHER)
Admission: RE | Admit: 2007-06-30 | Disposition: A | Discharge status: Home / Self Care | Payer: Self-pay | Source: Emergency Department | Attending: Emergency Medicine | Admitting: Emergency Medicine

## 2007-06-30 LAB — TROPONIN I
TROPONIN I: 0.01 ng/mL (ref 0.00–0.04)
TROPONIN I: 0.03 ng/mL (ref 0.00–0.04)

## 2007-06-30 LAB — CK-MB PROFILE
CK INDEX: 1.1 %
CK INDEX: 1.1 %
CK INDEX: 1.1 %
CKMB: 2.5 ng/mL (ref 0.0–4.0)
CKMB: 2.1 ng/mL (ref 0.0–4.0)
CKMB: 2.1 ng/mL (ref 0.0–4.0)
CREATINE KINASE TOTAL: 226 IU/L (ref 35–232)
CREATINE KINASE TOTAL: 186 IU/L (ref 35–232)
CREATINE KINASE TOTAL: 194 IU/L (ref 35–232)

## 2007-07-01 LAB — EKG

## 2007-07-01 LAB — EKG-12 ** TO BE DONE BY EKG **

## 2007-07-03 ENCOUNTER — Emergency Department (HOSPITAL_BASED_OUTPATIENT_CLINIC_OR_DEPARTMENT_OTHER)
Admission: RE | Admit: 2007-07-03 | Disposition: A | Discharge status: Home / Self Care | Payer: Self-pay | Source: Emergency Department | Attending: Emergency Medicine | Admitting: Emergency Medicine

## 2007-07-03 ENCOUNTER — Encounter (HOSPITAL_BASED_OUTPATIENT_CLINIC_OR_DEPARTMENT_OTHER): Payer: Self-pay

## 2007-07-03 LAB — HOLD RED TOP TUBE

## 2007-07-03 LAB — EMERGENCY ROOM NOTE

## 2007-07-03 LAB — TROPONIN I: TROPONIN I: <0.01 ng/mL (ref 0.00–0.04)

## 2007-07-03 LAB — EKG

## 2007-07-03 NOTE — ED Notes (Signed)
Chest pain x 15 minutes

## 2007-07-03 NOTE — ED Notes (Signed)
Pt states he has "real bad tightness, radiaitng down my arm that began 10- 15 minutes ago" so I came to the hospital.    We received a call from [facility] stating that they were bringing this Pt to [place] from [facility].  I called [facility] ED and spoke with the nsg supervisor who states that Pt is well known to them, was seen in their ED and adm to telemetry.  He became violent and police had to be called when he was told he was not going to receive any Dilaudid which is the only thing that takes away his CP.  He then signed out AMA.  Pt was brought to [place] by the [employer] and was [place] per security.

## 2007-07-03 NOTE — Discharge Instructions (Signed)
There is no evidence of a problem with your heart at this time.    Please continue all current therapies.     Follow up with your PCP and/or with the [facility] doctor who advised you regarding possible re-do of your bypass surgery if you continue to be concerned that there is continuing heart problems.

## 2007-07-04 LAB — EKG

## 2007-07-06 LAB — EMERGENCY ROOM NOTE

## 2007-07-21 LAB — XR CHEST PORTABLE

## 2007-08-08 ENCOUNTER — Encounter (HOSPITAL_BASED_OUTPATIENT_CLINIC_OR_DEPARTMENT_OTHER): Payer: Self-pay

## 2007-08-08 ENCOUNTER — Emergency Department (HOSPITAL_BASED_OUTPATIENT_CLINIC_OR_DEPARTMENT_OTHER)
Admission: RE | Admit: 2007-08-08 | Discharge status: Against Medical Advice | Payer: Self-pay | Source: Emergency Department | Attending: Emergency Medicine | Admitting: Emergency Medicine

## 2007-08-08 NOTE — ED Notes (Signed)
C/o chest pain 8/10 right now ambulance he received i nitro , extensive cardiac history recently was in [facility]  And thay daviced have a pacemeker but he said he left ama

## 2007-08-09 LAB — BLOOD COUNT COMPLETE AUTO&AUTO DIFRNTL WBC
BASOPHIL %: 0.7 % (ref 0.0–2.0)
EOSINOPHIL %: 4.5 % (ref 0.0–7.0)
HEMATOCRIT: 41.9 % — ABNORMAL LOW (ref 42.0–54.0)
HEMOGLOBIN: 14.1 g/dl (ref 14.0–18.0)
LYMPHOCYTE %: 31.5 % (ref 13.0–39.0)
MEAN CORP HGB CONC: 33.7 g/dl (ref 32.0–36.0)
MEAN CORPUSCULAR HGB: 31.3 pg (ref 27.0–33.0)
MEAN CORPUSCULAR VOL: 92.9 fl (ref 80.0–100.0)
MEAN PLATELET VOLUME: 8.1 fl (ref 6.4–10.8)
MONOCYTE %: 7 % (ref 1.0–12.0)
NEUTROPHIL %: 56.3 % (ref 46.0–79.0)
PLATELET COUNT: 344 10*3/uL (ref 150–400)
RBC DISTRIBUTION WIDTH: 12.6 % (ref 11.5–14.3)
RED BLOOD CELL COUNT: 4.51 M/uL (ref 4.50–6.10)
WHITE BLOOD CELL COUNT: 6.3 10*3/uL (ref 4.0–10.8)

## 2007-08-09 LAB — CK-MB PROFILE
CK INDEX: 0.8 %
CKMB: 1.5 ng/mL (ref 0.0–4.0)
CREATINE KINASE TOTAL: 181 IU/L (ref 35–232)

## 2007-08-09 LAB — COMPREHENSIVE METABOLIC PANEL
ALANINE AMINOTRANSFERASE: 40 IU/L (ref 10–40)
ALBUMIN: 3.7 g/dl (ref 3.4–4.8)
ALKALINE PHOSPHATASE: 52 IU/L (ref 25–106)
ANION GAP: 9 mmol/L (ref 2–25)
ASPARTATE AMINOTRANSFERASE: 34 IU/L (ref 8–34)
BILIRUBIN TOTAL: 1.1 mg/dl (ref 0.2–1.1)
BUN (UREA NITROGEN): 18 mg/dl (ref 6–20)
CALCIUM: 9.3 mg/dl (ref 8.6–10.0)
CARBON DIOXIDE: 25 mmol/L (ref 22–32)
CHLORIDE: 104 mmol/L (ref 101–111)
CREATININE: 0.9 mg/dl (ref 0.7–1.2)
ESTIMATED GLOMERULAR FILT RATE: >60 mL/min (ref 60–116)
Glucose Random: 87 mg/dl (ref 74–160)
POTASSIUM: 4.9 mmol/L (ref 3.5–5.1)
SODIUM: 138 mmol/L (ref 135–144)
TOTAL PROTEIN: 6.2 g/dl (ref 5.9–7.5)

## 2007-08-09 LAB — TROPONIN I: TROPONIN I: 0.02 ng/mL (ref 0.00–0.04)

## 2007-08-09 LAB — PROTHROMBIN TIME
INR: 1 — ABNORMAL LOW (ref 2.0–3.5)
PROTHROMBIN TIME: 10.3 SECONDS (ref 9.5–11.5)

## 2007-08-09 LAB — HOLD PURPLE TOP TUBE

## 2007-08-09 LAB — XR CHEST PORTABLE

## 2007-08-09 LAB — APTT: APTT: 26.1 SECONDS (ref 23.7–33.3)

## 2007-08-11 LAB — EKG

## 2007-08-14 LAB — EMERGENCY ROOM NOTE

## 2007-08-21 LAB — CORRESPONDENCE

## 2008-01-07 ENCOUNTER — Emergency Department (HOSPITAL_BASED_OUTPATIENT_CLINIC_OR_DEPARTMENT_OTHER)
Admission: RE | Admit: 2008-01-07 | Discharge status: Against Medical Advice | Payer: Self-pay | Source: Emergency Department | Attending: Emergency Medicine | Admitting: Emergency Medicine

## 2008-01-07 LAB — BLOOD COUNT COMPLETE AUTO&AUTO DIFRNTL WBC
BASOPHIL %: 0.2 % (ref 0.0–2.0)
EOSINOPHIL %: 2.1 % (ref 0.0–7.0)
HEMATOCRIT: 36.8 % — ABNORMAL LOW (ref 42.0–54.0)
HEMOGLOBIN: 12.3 g/dl — ABNORMAL LOW (ref 14.0–18.0)
LYMPHOCYTE %: 37.2 % (ref 13.0–39.0)
MEAN CORP HGB CONC: 33.4 g/dl (ref 32.0–36.0)
MEAN CORPUSCULAR HGB: 30.6 pg (ref 27.0–33.0)
MEAN CORPUSCULAR VOL: 91.4 fl (ref 80.0–100.0)
MEAN PLATELET VOLUME: 7.5 fl (ref 6.4–10.8)
MONOCYTE %: 6.7 % (ref 1.0–12.0)
NEUTROPHIL %: 53.8 % (ref 46.0–79.0)
PLATELET COUNT: 256 10*3/uL (ref 150–400)
RBC DISTRIBUTION WIDTH: 13.4 % (ref 11.5–14.3)
RED BLOOD CELL COUNT: 4.02 M/uL — ABNORMAL LOW (ref 4.50–6.10)
WHITE BLOOD CELL COUNT: 4.2 10*3/uL (ref 4.0–10.8)

## 2008-01-07 LAB — COMPREHENSIVE METABOLIC PANEL
ALANINE AMINOTRANSFERASE: 36 IU/L (ref 10–40)
ALBUMIN: 3.4 g/dl (ref 3.4–4.8)
ALKALINE PHOSPHATASE: 35 IU/L (ref 25–106)
ANION GAP: 4 mmol/L (ref 2–25)
ASPARTATE AMINOTRANSFERASE: 18 IU/L (ref 8–34)
BILIRUBIN TOTAL: 0.4 mg/dl (ref 0.2–1.1)
BUN (UREA NITROGEN): 8 mg/dl (ref 6–20)
CALCIUM: 9 mg/dl (ref 8.6–10.3)
CARBON DIOXIDE: 28 mmol/L (ref 22–32)
CHLORIDE: 109 mmol/L (ref 101–111)
CREATININE: 1.1 mg/dl (ref 0.7–1.2)
ESTIMATED GLOMERULAR FILT RATE: >60 mL/min (ref 60–116)
Glucose Random: 94 mg/dl (ref 74–160)
POTASSIUM: 3.8 mmol/L (ref 3.5–5.1)
SODIUM: 141 mmol/L (ref 135–144)
TOTAL PROTEIN: 5.9 g/dl (ref 5.9–7.5)

## 2008-01-07 LAB — SERUM DRUG SCREEN 6 DRUGS
ACETAMINOPHEN: <10 ug/ml — ABNORMAL LOW (ref 10–30)
BARBITURATE: NEGATIVE
BENZODIAZEPINE: NEGATIVE
ETHANOL: <10 mg/dl (ref <10)
SALICYLATE: <4 mg/dl — ABNORMAL LOW (ref 4–29)
TRICYCLIC ANTIDEPRESSANTS: NEGATIVE

## 2008-01-07 LAB — CK-MB PROFILE
CK INDEX: 0.7 %
CKMB: 0.9 ng/mL (ref 0.0–4.0)
CREATINE KINASE TOTAL: 134 IU/L (ref 35–232)

## 2008-01-07 LAB — PROTHROMBIN TIME
INR: 1.1 — ABNORMAL LOW (ref 2.0–3.5)
PROTHROMBIN TIME: 11.1 SECONDS (ref 9.5–11.5)

## 2008-01-07 LAB — XR CHEST PORTABLE

## 2008-01-07 LAB — APTT: APTT: 26.7 SECONDS (ref 23.7–33.3)

## 2008-01-07 LAB — TROPONIN I: TROPONIN I: <0.01 ng/mL (ref 0.00–0.04)

## 2008-01-07 LAB — HOLD PURPLE TOP TUBE

## 2008-01-07 NOTE — ED Notes (Signed)
SIGNIFICANT CARDIAC HISTORY, PRESENTING TO THE ED VIA ALS FOR CHEST PAIN 8/10, RECEIVED ASA 324MG  AND NITRO 0.4MG  S/L X 1 ENROUTE. 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

## 2008-01-08 LAB — EMERGENCY ROOM NOTE

## 2008-01-09 LAB — EKG

## 2008-04-29 ENCOUNTER — Emergency Department (HOSPITAL_BASED_OUTPATIENT_CLINIC_OR_DEPARTMENT_OTHER)
Admission: RE | Admit: 2008-04-29 | Disposition: A | Discharge status: Home / Self Care | Payer: Self-pay | Source: Emergency Department | Attending: Emergency Medicine | Admitting: Emergency Medicine

## 2008-04-29 ENCOUNTER — Encounter (HOSPITAL_BASED_OUTPATIENT_CLINIC_OR_DEPARTMENT_OTHER): Payer: Self-pay

## 2008-04-29 LAB — COMPREHENSIVE METABOLIC PANEL
ALANINE AMINOTRANSFERASE: 26 IU/L (ref 10–40)
ALBUMIN: 3.7 g/dl (ref 3.4–4.8)
ALKALINE PHOSPHATASE: 42 IU/L (ref 25–106)
ANION GAP: 6 mmol/L (ref 2–25)
ASPARTATE AMINOTRANSFERASE: 34 IU/L (ref 8–34)
BILIRUBIN TOTAL: 0.4 mg/dl (ref 0.2–1.1)
BUN (UREA NITROGEN): 10 mg/dl (ref 6–20)
CALCIUM: 9 mg/dl (ref 8.6–10.3)
CARBON DIOXIDE: 27 mmol/L (ref 22–32)
CHLORIDE: 107 mmol/L (ref 101–111)
CREATININE: 1 mg/dl (ref 0.7–1.2)
ESTIMATED GLOMERULAR FILT RATE: >60 mL/min (ref 60–116)
Glucose Random: 144 mg/dl (ref 74–160)
POTASSIUM: 4 mmol/L (ref 3.5–5.1)
SODIUM: 140 mmol/L (ref 135–144)
TOTAL PROTEIN: 6.2 g/dl (ref 5.9–7.5)

## 2008-04-29 LAB — CK-MB PROFILE
CK INDEX: 0.7 %
CKMB: 1.2 ng/mL (ref 0.0–4.0)
CREATINE KINASE TOTAL: 183 IU/L (ref 35–232)

## 2008-04-29 LAB — URINE DRUG SCREEN 7 DRUGS
AMPHETAMINES URINE: NEGATIVE
BARBITURATES URINE: NEGATIVE
BENZODIAZEPINES URINE: NEGATIVE
CANNABINOIDS URINE: NEGATIVE
COCAINE METABOLITES URINE: NEGATIVE
ETHANOL URINE: NEGATIVE
OPIATES URINE: NEGATIVE

## 2008-04-29 LAB — BLOOD COUNT COMPLETE AUTO&AUTO DIFRNTL WBC
BASOPHIL %: 0.1 % (ref 0.0–2.0)
EOSINOPHIL %: 3.4 % (ref 0.0–7.0)
HEMATOCRIT: 40.4 % — ABNORMAL LOW (ref 42.0–54.0)
HEMOGLOBIN: 13.6 g/dl — ABNORMAL LOW (ref 14.0–18.0)
LYMPHOCYTE %: 32.8 % (ref 13.0–39.0)
MEAN CORP HGB CONC: 33.8 g/dl (ref 32.0–36.0)
MEAN CORPUSCULAR HGB: 31.2 pg (ref 27.0–33.0)
MEAN CORPUSCULAR VOL: 92.2 fl (ref 80.0–100.0)
MEAN PLATELET VOLUME: 8.4 fl (ref 6.4–10.8)
MONOCYTE %: 4.8 % (ref 1.0–12.0)
NEUTROPHIL %: 58.9 % (ref 46.0–79.0)
PLATELET COUNT: 295 10*3/uL (ref 150–400)
RBC DISTRIBUTION WIDTH: 13.3 % (ref 11.5–14.3)
RED BLOOD CELL COUNT: 4.38 M/uL — ABNORMAL LOW (ref 4.50–6.10)
WHITE BLOOD CELL COUNT: 6.5 10*3/uL (ref 4.0–10.8)

## 2008-04-29 LAB — XR CHEST PORTABLE

## 2008-04-29 LAB — TROPONIN I
TROPONIN I: <0.01 ng/mL (ref 0.00–0.04)
TROPONIN I: 0.01 ng/mL (ref 0.00–0.04)

## 2008-04-29 LAB — APTT: APTT: 23.7 SECONDS (ref 23.7–33.3)

## 2008-04-29 LAB — PROTHROMBIN TIME
INR: 1 — ABNORMAL LOW (ref 2.0–3.5)
PROTHROMBIN TIME: 10 SECONDS (ref 9.5–11.5)

## 2008-04-29 LAB — CHG CREATINE KINASE TOTAL: CREATINE KINASE TOTAL: 198 IU/L (ref 35–232)

## 2008-04-29 NOTE — ED Notes (Signed)
Pt arrives after having awoken with left sided chest pain and radiation to his left arm.  He did not take nitroglycerin at home but had 2 sprays in the ambulance as well as 4 baby asa.  He feels the chest pain decreased from an 8 to a 7 after the nitro.

## 2008-04-29 NOTE — Discharge Instructions (Signed)
Your diagnosis:  Chest pain    What we did in the Emergency Department:  - checked electrocardiograms -- no change from prior and no sign of ongoing heart attack  - checked blood for signs of heart muscle injury -- fortunately, none right now  - reviewed your records from [facility], [facility] and your records from [facility].    - Your most recent catheterization from [facility] from 04/07/2008 is very good.  It does not show a cardiac (heart) reason for having chest pain.      Next steps:  - see Dr. Bryson Dames, one of our cardiologists, next week.  His number is above -- he is expecting your call to make an appointment.      Come back to the Emergency Department for:  Can't breathe, seizure, new or worse chest pain -- especially if associated with sweating, vomiting, inability to breathe, trauma or injury.

## 2008-05-04 LAB — EKG

## 2008-05-10 ENCOUNTER — Encounter (HOSPITAL_BASED_OUTPATIENT_CLINIC_OR_DEPARTMENT_OTHER): Payer: Self-pay

## 2008-05-10 ENCOUNTER — Emergency Department (HOSPITAL_BASED_OUTPATIENT_CLINIC_OR_DEPARTMENT_OTHER)
Admission: RE | Admit: 2008-05-10 | Disposition: A | Discharge status: Other Acute Inpatient Hospital | Payer: Self-pay | Source: Emergency Department | Attending: Emergency Medicine | Admitting: Emergency Medicine

## 2008-05-10 HISTORY — DX: Acute myocardial infarction, unspecified: I21.9

## 2008-05-10 LAB — COMPREHENSIVE METABOLIC PANEL
ALANINE AMINOTRANSFERASE: 34 IU/L (ref 10–40)
ALBUMIN: 3.8 g/dl (ref 3.4–4.8)
ALKALINE PHOSPHATASE: 42 IU/L (ref 25–106)
ANION GAP: 1 mmol/L — ABNORMAL LOW (ref 2–25)
ASPARTATE AMINOTRANSFERASE: 29 IU/L (ref 8–34)
BILIRUBIN TOTAL: 0.6 mg/dl (ref 0.2–1.1)
BUN (UREA NITROGEN): 10 mg/dl (ref 6–20)
CALCIUM: 9.3 mg/dl (ref 8.6–10.3)
CARBON DIOXIDE: 29 mmol/L (ref 22–32)
CHLORIDE: 110 mmol/L (ref 101–111)
CREATININE: 1 mg/dl (ref 0.7–1.2)
ESTIMATED GLOMERULAR FILT RATE: >60 mL/min (ref 60–116)
Glucose Random: 127 mg/dl (ref 74–160)
POTASSIUM: 3.7 mmol/L (ref 3.5–5.1)
SODIUM: 140 mmol/L (ref 135–144)
TOTAL PROTEIN: 6.3 g/dl (ref 5.9–7.5)

## 2008-05-10 LAB — BLOOD COUNT COMPLETE AUTO&AUTO DIFRNTL WBC
BASOPHIL %: 2.1 % — ABNORMAL HIGH (ref 0.0–2.0)
EOSINOPHIL %: 2.5 % (ref 0.0–7.0)
HEMATOCRIT: 39 % — ABNORMAL LOW (ref 42.0–54.0)
HEMOGLOBIN: 13.2 g/dl — ABNORMAL LOW (ref 14.0–18.0)
LYMPHOCYTE %: 19.2 % (ref 13.0–39.0)
MEAN CORP HGB CONC: 33.8 g/dl (ref 32.0–36.0)
MEAN CORPUSCULAR HGB: 31.9 pg (ref 27.0–33.0)
MEAN CORPUSCULAR VOL: 94.2 fl (ref 80.0–100.0)
MEAN PLATELET VOLUME: 7.9 fl (ref 6.4–10.8)
MONOCYTE %: 2.4 % (ref 1.0–12.0)
NEUTROPHIL %: 73.8 % (ref 46.0–79.0)
PLATELET COUNT: 230 10*3/uL (ref 150–400)
RBC DISTRIBUTION WIDTH: 13.4 % (ref 11.5–14.3)
RED BLOOD CELL COUNT: 4.14 M/uL — ABNORMAL LOW (ref 4.50–6.10)
WHITE BLOOD CELL COUNT: 5.9 10*3/uL (ref 4.0–10.8)

## 2008-05-10 LAB — PROTHROMBIN TIME
INR: 1 — ABNORMAL LOW (ref 2.0–3.5)
PROTHROMBIN TIME: 10.1 SECONDS (ref 9.5–11.5)

## 2008-05-10 LAB — CK-MB PROFILE
CK INDEX: 0.7 %
CKMB: 1.7 ng/mL (ref 0.0–4.0)
CREATINE KINASE TOTAL: 258 IU/L (ref 35–232)

## 2008-05-10 LAB — TROPONIN I: TROPONIN I: 0.01 ng/mL (ref 0.00–0.04)

## 2008-05-10 LAB — APTT: APTT: 27.8 SECONDS (ref 23.7–33.3)

## 2008-05-10 NOTE — ED Notes (Signed)
PT   DEVELPED  CHEST PAIN AFTER WALKING, ABOUT  1/2  AGO   PER PT  NO  SOB,,,,,,,    MILD   DIZZINESS,  SINUS   RHYTHM    ON    THE   MONITOR   PER   PARAMEDICS. FROM [place].  122/92.  UNABLE   TO   START   IV   ACCESS,,,UNABLE TO FIND A VESSEL.  PT ALERT  ORIENTED,,,,ARRIVES WITH CHEST PAIN,,,,7/10

## 2008-05-11 LAB — XR CHEST PORTABLE

## 2008-05-12 LAB — EKG

## 2008-05-20 LAB — EMERGENCY ROOM NOTE

## 2008-06-05 LAB — EMERGENCY ROOM NOTE

## 2008-12-13 ENCOUNTER — Emergency Department (HOSPITAL_BASED_OUTPATIENT_CLINIC_OR_DEPARTMENT_OTHER)
Admission: RE | Admit: 2008-12-13 | Disposition: A | Discharge status: Home / Self Care | Payer: Self-pay | Source: Emergency Department | Attending: Emergency Medicine | Admitting: Emergency Medicine

## 2008-12-13 ENCOUNTER — Encounter (HOSPITAL_BASED_OUTPATIENT_CLINIC_OR_DEPARTMENT_OTHER): Payer: Self-pay

## 2008-12-13 LAB — BLOOD COUNT COMPLETE AUTO&AUTO DIFRNTL WBC
BASOPHIL %: 0.4 % (ref 0.0–2.0)
EOSINOPHIL %: 1.7 % (ref 0.0–7.0)
HEMATOCRIT: 40.3 % — ABNORMAL LOW (ref 42.0–54.0)
HEMOGLOBIN: 13.4 g/dl — ABNORMAL LOW (ref 14.0–18.0)
LYMPHOCYTE %: 26.7 % (ref 13.0–39.0)
MEAN CORP HGB CONC: 33.3 g/dl (ref 32.0–36.0)
MEAN CORPUSCULAR HGB: 31.8 pg (ref 27.0–33.0)
MEAN CORPUSCULAR VOL: 95.5 fl (ref 80.0–100.0)
MEAN PLATELET VOLUME: 7 fl (ref 6.4–10.8)
MONOCYTE %: 5.8 % (ref 1.0–12.0)
NEUTROPHIL %: 65.4 % (ref 46.0–79.0)
PLATELET COUNT: 257 10*3/uL (ref 150–400)
RBC DISTRIBUTION WIDTH: 14.4 % — ABNORMAL HIGH (ref 11.5–14.3)
RED BLOOD CELL COUNT: 4.22 M/uL — ABNORMAL LOW (ref 4.50–6.10)
WHITE BLOOD CELL COUNT: 6.4 10*3/uL (ref 4.0–10.8)

## 2008-12-13 LAB — PROTHROMBIN TIME
INR: 1 — ABNORMAL LOW (ref 2.0–3.5)
PROTHROMBIN TIME: 10.2 SECONDS (ref 9.5–11.5)

## 2008-12-13 LAB — COMPREHENSIVE METABOLIC PANEL
ALANINE AMINOTRANSFERASE: 25 IU/L (ref 10–40)
ALBUMIN: 3.9 g/dl (ref 3.4–4.8)
ALKALINE PHOSPHATASE: 46 IU/L (ref 25–106)
ANION GAP: 7 mmol/L (ref 2–25)
ASPARTATE AMINOTRANSFERASE: 24 IU/L (ref 8–34)
BILIRUBIN TOTAL: 0.7 mg/dl (ref 0.2–1.1)
BUN (UREA NITROGEN): 14 mg/dl (ref 6–20)
CALCIUM: 9.4 mg/dl (ref 8.6–10.3)
CARBON DIOXIDE: 26 mmol/L (ref 22–32)
CHLORIDE: 105 mmol/L (ref 101–111)
CREATININE: 1 mg/dl (ref 0.7–1.2)
ESTIMATED GLOMERULAR FILT RATE: >60 mL/min (ref >60)
Glucose Random: 109 mg/dl (ref 74–160)
POTASSIUM: 3.5 mmol/L (ref 3.5–5.1)
SODIUM: 138 mmol/L (ref 135–144)
TOTAL PROTEIN: 6.5 g/dl (ref 5.9–7.5)

## 2008-12-13 LAB — CK-MB PROFILE
CK INDEX: 0.9 %
CKMB: 1.9 ng/mL (ref 0.0–4.0)
CREATINE KINASE TOTAL: 202 IU/L (ref 35–232)

## 2008-12-13 LAB — TROPONIN I: TROPONIN I: 0.01 ng/mL (ref 0.00–0.04)

## 2008-12-13 LAB — APTT: APTT: 26.5 SECONDS (ref 23.7–33.3)

## 2008-12-13 NOTE — ED Notes (Signed)
30 minutes of left sided chest pain radiating to left arm. No sob, mild nausea, skin warm and dry.

## 2008-12-14 LAB — XR CHEST PORTABLE

## 2008-12-14 LAB — CK-MB PROFILE
CK INDEX: 0.9 %
CKMB: 1.7 ng/mL (ref 0.0–4.0)
CREATINE KINASE TOTAL: 181 IU/L (ref 35–232)

## 2008-12-14 LAB — TROPONIN I: TROPONIN I: <0.01 ng/mL (ref 0.00–0.04)

## 2008-12-14 LAB — EKG

## 2008-12-14 NOTE — ED Notes (Signed)
D/w patient need for follow-up CT in 3 months.  Pt. Verbalized understanding

## 2008-12-20 LAB — EMERGENCY ROOM NOTE

## 2009-02-12 LAB — EMERGENCY ROOM NOTE

## 2009-03-10 ENCOUNTER — Emergency Department (HOSPITAL_BASED_OUTPATIENT_CLINIC_OR_DEPARTMENT_OTHER)
Admission: RE | Admit: 2009-03-10 | Disposition: A | Discharge status: Home / Self Care | Payer: Self-pay | Source: Emergency Department | Attending: Physician Assistant | Admitting: Physician Assistant

## 2009-03-10 ENCOUNTER — Encounter (HOSPITAL_BASED_OUTPATIENT_CLINIC_OR_DEPARTMENT_OTHER): Payer: Self-pay

## 2009-03-10 MED ORDER — TYLENOL 325 MG PO TABS
ORAL_TABLET | ORAL | Status: AC
Start: 2009-03-10 — End: 2009-04-09

## 2009-03-10 NOTE — Discharge Instructions (Signed)
Take Tylenol or Motrin 1 to 2 tablets every 4 to 6 hours as needed for pain. Keep elevated and apply ice pack to affected area to control swelling. Read discharge instructions given and follow up with your doctor.

## 2009-03-10 NOTE — ED Notes (Signed)
PT INJURED R HAND WHILE HELPING MOVE FURNITURE.  HEARD POP AND NOT ABLE TO MOVE HAND OR WRIST WELL.  SWELLING NOTED.

## 2009-03-11 ENCOUNTER — Ambulatory Visit

## 2009-03-11 LAB — XR HAND RIGHT MINIMUM 3 VIEWS

## 2009-03-14 LAB — EMERGENCY ROOM NOTE

## 2009-08-20 MED ORDER — PENICILLIN V-K 500 MG TAB
500 mg | ORAL_TABLET | Freq: Four times a day (QID) | ORAL | Status: AC
Start: 2009-08-20 — End: 2009-08-27

## 2009-08-20 MED ORDER — HYDROCODONE-ACETAMINOPHEN 5 MG-500 MG TAB
5-500 mg | ORAL_TABLET | ORAL | Status: AC | PRN
Start: 2009-08-20 — End: 2009-08-27

## 2009-08-20 NOTE — ED Notes (Signed)
Tooth pain yesterday this am, bottom right.

## 2009-08-20 NOTE — ED Notes (Signed)
I have reviewed discharge instructions with the patient.  The patient verbalized understanding.E-signature pad not working. Pt signed paper d/c instructions and copy placed on chart.

## 2009-08-20 NOTE — ED Provider Notes (Signed)
Patient is a 34 y.o. male presenting with tooth pain. The history is provided by the patient.   Dental Pain       Right mandibular molar pain. No other issues. Mild Duration several days     Past Medical History   Diagnosis Date   ??? Myocardial infarct    ??? Cardiac revascularization with aortocoronary bypass anastomosis    ??? Hypertension    ??? CAD (coronary artery disease)           No past surgical history on file.      No family history on file.     History   Social History   ??? Marital Status: Single     Spouse Name: N/A     Number of Children: N/A   ??? Years of Education: N/A   Occupational History   ??? Not on file.   Social History Main Topics   ??? Smoking status: Current Some Day Smoker   ??? Smokeless tobacco: Not on file   ??? Alcohol Use: No   ??? Drug Use: No   ??? Sexually Active:    Other Topics Concern   ??? Not on file   Social History Narrative   ??? No narrative on file           ALLERGIES: Toradol, Tramadol and Morphine      Review of Systems   All other systems reviewed and are negative.        Filed Vitals:    08/20/09 1843   BP: 140/81   Pulse: 87   Temp: 98 ??F (36.7 ??C)   Resp: 18   Height: 6\' 3"  (1.905 m)   Weight: 250 lb (113.399 kg)   SpO2: 97%              Physical Exam   Nursing note and vitals reviewed.  Constitutional: He appears well-developed and well-nourished.   HENT:   Head: Normocephalic and atraumatic.        Right mandibular molar with severe decay. No fever, facial edema or trisums   Eyes: Conjunctivae and extraocular motions are normal. Pupils are equal, round, and reactive to light. Right eye exhibits no discharge. Left eye exhibits no discharge. No scleral icterus.   Neck: Normal range of motion. Neck supple.   Cardiovascular: Normal rate and regular rhythm.    Pulmonary/Chest: Effort normal and breath sounds normal.        MDM    Procedures

## 2009-08-20 NOTE — ED Notes (Signed)
Requested pt changed into gown, pt refused.

## 2009-12-15 DIAGNOSIS — I2510 Atherosclerotic heart disease of native coronary artery without angina pectoris: Secondary | ICD-10-CM | POA: Insufficient documentation

## 2009-12-15 DIAGNOSIS — I10 Essential (primary) hypertension: Secondary | ICD-10-CM | POA: Insufficient documentation

## 2010-01-23 ENCOUNTER — Ambulatory Visit

## 2010-07-16 ENCOUNTER — Emergency Department (HOSPITAL_BASED_OUTPATIENT_CLINIC_OR_DEPARTMENT_OTHER)
Admission: RE | Admit: 2010-07-16 | Disposition: A | Discharge status: Home / Self Care | Payer: Self-pay | Source: Emergency Department | Attending: Emergency Medicine | Admitting: Emergency Medicine

## 2010-07-16 LAB — XR CHEST PORTABLE

## 2010-07-16 MED ORDER — ASPIRIN 81 MG PO CHEW
324.00 mg | CHEWABLE_TABLET | Freq: Once | ORAL | Status: AC
Start: 2010-07-16 — End: 2010-07-16
  Administered 2010-07-16: 324 mg via ORAL

## 2010-07-16 MED ORDER — NITROGLYCERIN 0.4 MG SL SUBL
0.4000 mg | SUBLINGUAL_TABLET | SUBLINGUAL | Status: DC | PRN
Start: 2010-07-16 — End: 2010-07-16

## 2010-07-16 MED ORDER — ASPIRIN 81 MG PO CHEW
CHEWABLE_TABLET | ORAL | Status: AC
Start: 2010-07-16 — End: 2010-07-16
  Administered 2010-07-16: 324 mg via ORAL
  Filled 2010-07-16: qty 4

## 2010-07-16 NOTE — ED Notes (Signed)
Pt arrived ambulatory awake alert/oriented w c/o chest pain radiating to lt arm described as a chest tightness which  Started about 2300 [place] [address].. Pt has a scar on chest from previous cardiac surgery and states that he has had stents placed and hx of an MI X3. Pt also notes that while walking to hospital he twisted rt ankle and heard a crack. Pt placed on O2, monitor, ekg done and ice to rt foot-ankle. Pt refused to stay when confronted by MD about his multiple narcotic prescriptions from different providers. IV dc and Pt dc'd from hospital.

## 2010-07-16 NOTE — Discharge Instructions (Signed)
You presented with chest pain and became very upset with me when I spoke to you about my concerns for your narcotic abuse. I offered to admit you to the hospital but you wanted to him leave and we discharged you.    You may return to the ER at any time if you change your mind.

## 2010-07-17 LAB — EKG-12 ** TO BE DONE BY EKG **

## 2010-07-17 LAB — EKG

## 2010-07-17 LAB — EMERGENCY ROOM NOTE

## 2011-04-28 DIAGNOSIS — Q278 Other specified congenital malformations of peripheral vascular system: Secondary | ICD-10-CM | POA: Insufficient documentation

## 2013-09-25 DIAGNOSIS — F17200 Nicotine dependence, unspecified, uncomplicated: Secondary | ICD-10-CM | POA: Insufficient documentation

## 2013-10-29 DIAGNOSIS — Z9049 Acquired absence of other specified parts of digestive tract: Secondary | ICD-10-CM | POA: Insufficient documentation

## 2014-05-08 ENCOUNTER — Emergency Department (HOSPITAL_BASED_OUTPATIENT_CLINIC_OR_DEPARTMENT_OTHER)
Admission: RE | Admit: 2014-05-08 | Disposition: A | Discharge status: Home / Self Care | Payer: Self-pay | Source: Emergency Department | Attending: Emergency Medicine | Admitting: Emergency Medicine

## 2014-05-08 ENCOUNTER — Encounter (HOSPITAL_BASED_OUTPATIENT_CLINIC_OR_DEPARTMENT_OTHER): Payer: Self-pay | Admitting: Internal Medicine

## 2014-05-08 MED ORDER — IBUPROFEN 800 MG PO TABS
800.0000 mg | ORAL_TABLET | Freq: Once | ORAL | Status: AC
Start: 2014-05-08 — End: 2014-05-08
  Administered 2014-05-08: 800 mg via ORAL
  Filled 2014-05-08: qty 1

## 2014-05-08 MED ORDER — IBUPROFEN 800 MG PO TABS
800.00 mg | ORAL_TABLET | Freq: Two times a day (BID) | ORAL | Status: AC | PRN
Start: 2014-05-08 — End: 2014-05-13

## 2014-05-08 MED ORDER — OXYCODONE-ACETAMINOPHEN 5-325 MG PO TABS
1.0000 | ORAL_TABLET | Freq: Once | ORAL | Status: AC
Start: 2014-05-08 — End: 2014-05-08
  Administered 2014-05-08: 1 via ORAL
  Filled 2014-05-08: qty 1

## 2014-05-08 NOTE — ED Provider Notes (Addendum)
I have reviewed the ED nursing notes and prior records. I have reviewed the patient's past medical history/problem list, allergies, social history and medication list.  I saw this patient primarily.      HPI:  This 38 year old male patient presents with chief complaint of dental pain for the past one day.  The patient denies any fevers.  He states he has an appointment with his dentist on Monday to have his teeth extracted.  He has very poor dentition.  He is a smoker.    Review of Systems:  Pertinent positives were reviewed as per the HPI above. All other systems were reviewed and are negative.    Past Medical History/Problem List:    Past Medical History    Mitral valve prolapse     MI (myocardial infarction)     CAD (coronary artery disease)     Hypercholesteremia     Acute myocardial infarction, unspecified site, episode of care unspecified      There is no problem list on file for this patient.      Past Surgical History:      Past Surgical History    CABG W/ARTERIAL GRAFT SINGLE ARTERIAL GRAFT  2007    CABG, MIN INV, 1 ART, 1 VEN         Medications:     No current facility-administered medications on file prior to encounter.  Current Outpatient Prescriptions on File Prior to Encounter:  METOPROLOL TARTRATE OR None Entered Disp:  Rfl:    ASPIRIN 81 MG OR TABS daily Disp:  Rfl:    LIPITOR 20 MG OR TABS daily Disp:  Rfl:        Social History:     Smoking status: Not on file    Smokeless tobacco: Not on file    Alcohol Use: Not on file       Allergies:  Review of Patient's Allergies indicates:   Ketorolac trometham*    Hives   Morphine and related    Anaphylaxis   Morphine                    Comment:Pt. States can take dilaudid and other narcotics    Physical Exam:  ED Triage Vitals   Enc Vitals Group      BP 05/08/14 1046 139/77 mmHg      Pulse 05/08/14 1046 78      Resp 05/08/14 1046 20      Temp 05/08/14 1046 98.3 F      Temp src --       SpO2 05/08/14 1046 97 %      Weight 05/08/14 1046 117.935 kg (260 lb)       Height --       Head Cir --       Peak Flow --       Pain Score 05/08/14 1046 8       Pain Loc --       Pain Edu? --       Excl. in GC? --        GENERAL:  Well-appearing, no distress.  SKIN:  Warm & Dry  HEENT: Multiple dental caries, right and teeth, no signs of ANUG, mild gingival erythema, no purulence  NECK:  Supple      ED Course and Medical Decision-making:    Pertinent labs and imaging studies reviewed.  Emergency Department nursing record was.  [facility].  In brief this is a 38 year old male with poor dentition presenting for dental pain.  He has no point with his dentist on Monday.  The patient was encouraged to take ibuprofen for pain.  He will return for any fevers or chills.    The reasons to return to the emergency department were reviewed in detail with the patient.   Patient appeared to understand and be in agreement with this treatment plan and disposition    Disposition: home  Condition on ED departure:  Improved and stable  PCP:  NON STAFF PROVIDER    Diagnosis/Diagnoses:  Dental caries    Deri FuellingJanine Dianely Krehbiel, MD  [facility]

## 2014-05-08 NOTE — Narrator Note (Signed)
Medicated as ordered, pt was requesting a prescription for percocet, Pt d/c home, d/c instructions/ prescription for motrin given, to f/u with PCP.Pt verbalized understanding about discharge intructions.

## 2014-05-08 NOTE — Discharge Instructions (Signed)
Dental Caries °Dental caries is tooth decay. This decay can cause a hole in teeth (cavity) that can get bigger and deeper over time. °HOME CARE °· Brush and floss your teeth. Do this at least two times a day. °· Use a fluoride toothpaste. °· Use a mouth rinse if told by your dentist or doctor. °· Eat less sugary and starchy foods. Drink less sugary drinks. °· Avoid snacking often on sugary and starchy foods. Avoid sipping often on sugary drinks. °· Keep regular checkups and cleanings with your dentist. °· Use fluoride supplements if told by your dentist or doctor. °· Allow fluoride to be applied to teeth if told by your dentist or doctor. °Document Released: 02/14/2008 Document Revised: 09/21/2013 Document Reviewed: 05/09/2012 °[employer]® Patient Information ©2015 [employer]. This information is not intended to replace advice given to you by your health care provider. Make sure you discuss any questions you have with your health care provider. ° °

## 2014-05-08 NOTE — ED Triage Note (Signed)
Pt presented to ed with 8/10 dental pain since last night, pt stated that he has an apt on monday

## 2014-05-28 ENCOUNTER — Emergency Department (HOSPITAL_BASED_OUTPATIENT_CLINIC_OR_DEPARTMENT_OTHER)
Admission: RE | Admit: 2014-05-28 | Payer: Self-pay | Source: Emergency Department | Attending: Emergency Medicine | Admitting: Emergency Medicine

## 2014-05-28 NOTE — ED Provider Notes (Signed)
I did not evaluate this patient.   Patient eloped from the waiting area prior to any evaluation.     Porfirio MylarValerie Leigh Blas, MD

## 2014-05-28 NOTE — Narrator Note (Signed)
Pt not in waiting room to be brought into the ED, waiting to see if pt will return

## 2014-05-28 NOTE — Narrator Note (Signed)
Pt did not return to ED to be seen, left prior to vitals or MD eval

## 2015-05-08 DIAGNOSIS — F1190 Opioid use, unspecified, uncomplicated: Secondary | ICD-10-CM | POA: Insufficient documentation

## 2015-05-08 DIAGNOSIS — Z91199 Patient's noncompliance with other medical treatment and regimen due to unspecified reason: Secondary | ICD-10-CM | POA: Insufficient documentation

## 2015-08-09 DIAGNOSIS — Z765 Malingerer [conscious simulation]: Secondary | ICD-10-CM

## 2016-01-06 NOTE — ED Provider Notes (Addendum)
[facility]    [place], [place]                         REPORT TYPE:   Emergency Dept Provider Report             REPORT STATUS: Signed    ________________________________________________________________________        PATIENT NAME:      Dustin Carney,Dustin Carney   PATIENT ID #:   [id number]    BIRTHDATE:           [birth year]   ACCOUNT #:     [id number]    AGE:           40      ADMITTED:             GENDER:           M      ED PHYSICIAN: Steva ColderGARCIA,Navdeep Halt APRN, FNP    LOC:          ED      ED ARRIVAL: 01/06/16  1640h    CHIEF COMPLAINT: Dental Pain    ________________________________________________________________________        Lysle DingwallINTERESTED PARTIES:       NURSING UNIT:  ED    PRIMARY CARE PHYSICIAN:     OTHER COPIES TO:                                                    ________________________________________________________________________            History of Present Illness    Date of Service:  Jan 06, 2016    Chief Complaint    dental pain    Travelled outside the [place]:  No    Patient displaying symptoms:  No    Source:  patient    40 y/o M presents to ED c/o right upper dental pain for approximately the last week. Patient   reports has history of chronic dental decay and this is the last upper tooth remaining. Reports   pain has become more persistent and intense overlies 2 days. Reports has made an appointment with   his dentist on Monday but cannot tolerate the pain until then. Denies fever, swelling, drainage,   difficulty breathing or swallowing.        Review of Systems    Ears, Nose, Throat, Mouth:  see HPI    All Other Systems:  Reviewed and Negative    ALLERGIES:      Coded Allergies:           ketorolac tromethamine (Verified  Allergy, Mild, 01/06/16)     HIVES         codeine (Unverified  Allergy, Unknown, HIVES, 01/06/16)         ibuprofen (Verified  Allergy, Unknown, 01/06/16)     HIVES AND ITCHING         tramadol (Verified  Adverse Reaction, Unknown, "GAGGING", 01/06/16)     ------------------------------         HOME MEDICATIONS     Active    Penicillin V Potassium 500 Mg Tablet 500 Mg PO BID     Reported    Metoprolol Succinate (Er) (Metoprolol Succinate) 50 Mg Tabcr 50 Mg PO DAILY    Lipitor (Atorvastatin Calcium) 20 Mg Tablet 40 Mg PO  DAILY    Aspir 81 (Aspirin) 81 Mg Tablet.dr 324 Mg PO DAILY            Past Medical History    Medical History:  heart disease, high cholesterol, hypertension, MI, other    Surgical History:  cholecystectomy, coronary bypass surgery    Psych History:  opiate abuse    Family History    Significant Family History:  heart disease        Social History    Smoking Status:  Heavy Tobacco Smoker    Alcohol Use:  occasionally    Drug Use:  oxycodone (Percocet), hydrocodone (Vicodin)        Physical Exam    VITAL SIGNS         Vital Signs              Date Time  Temp Pulse Resp B/P Pulse Ox O2 Delivery O2 Flow Rate FiO2         01/06/16 16:55 36.9 86 16 134/86 96 Room Air              Nursing Documentation Vitals:  reviewed by me    **Date:  Jan 06, 2016    **Time:  17:10    *    GENERAL: well-developed, well-nourished, in no distress    HEAD: atraumatic, normocephalic, non-tender frontal and maxillary sinuses on percussion    EYES: PERRLA, no eye discharge or injection    EARS: bilateral tympanic membranes pearly gray, non-bulging, normal light reflex    NOSE: nasal turbinates non-swollen, moist without redness or discharge    OROPHARYNX: buccal mucosa moist, oropharynx clear; Right upper molar with moderate decay and mild   tenderness with palpation. no surrounding swelling erythema, fluctuance, or drainage    NECK: supple, no adenopathy, full ROM, non-tender    LUNGS: clear to auscultation bilaterally, no wheezes, rales or rhonchi; respirations even and   unlabored    HEART: regular rate and rhythm; immediate capillary refill, strong bilateral radial pulses        Course    Medical Decision Making     Patient is afebrile, very well-appearing, and in no distress. No trismus, abscess, or suggestion of   emergent or significant deep space or soft tissue infection at this time. Pt declined dental block.   The patient appears stable for discharge and verbalizes understanding of home care, followup with   dental provider, and ED return precautions.        Discharge Plan    Clinical Impression:       Primary Impression:       Pain, dental        Additional Instructions:    TOOTHACHE:             Your pain is due to dental decay.  The tooth must be repaired in order for you to feel better.    You will, therefore, be referred to a dentist.          Severe swelling or drainage around a tooth usually means a dental abscess.  This also requires   evaluation and treatment by the dentist, but antibiotics may be prescribed while awaiting dental   treatment. Keep your appointment with your dentist on Monday.         You should be rechecked immediately if you develop major swelling of the face, increasing pain  , a lump in the jaw or gums, headache, difficulty swallowing, or fever.  PENICILLIN V K:         You have been given a prescription for Penicillin VK.  Your physician has determined that this   is the best antibiotic for your condition.         Pen VK can be taken with meals, however more of the antibiotic gets into the bloodstream if it'  s taken on an empty stomach.         Penicillin usually has no side effects.  However, allergy to penicillins is common.  If you   have had an allergic reaction to any drug of the penicillin family, you should never take any other   penicillin. Stop the medication and return for reevaluation if you develop hives, itching, swelling  , faintness, or shortness of breath.    Scripts    Penicillin V Potassium 500 Mg Tablet500 Mg PO BID #10 TAB  Ref 0       Prov:Dustin Mcaffee APRN, FNP         01/06/16    Condition:  Stable            Dustin Arthurs APRN, FNP Jan 06, 2016 17:04         Signatures:   <Electronically signed by Dustin Karson Chicas APRN, FNP> at 01/06/16 1729                                                                    Disclaimer: Portions of this document may contain text elements generated through computerized   voice recognition. Undetected computer generated word errors are possible. Please notify the   signing provider or return the document to [facility] Records department if clarification or   correction is needed.

## 2016-01-24 NOTE — ED Provider Notes (Addendum)
[facility]    [place], [place]                         REPORT TYPE:   Emergency Dept Provider Report             REPORT STATUS: Signed    ________________________________________________________________________        PATIENT NAME:      Dustin BrackettSAUNDERS,Seville L   PATIENT ID #:   [id number]    BIRTHDATE:           1975-09-09   ACCOUNT #:     [id number]    AGE:           40      ADMITTED:             GENDER:           M      ED PHYSICIAN: Mckenleigh Tarlton DO    LOC:          ED      ED ARRIVAL: 01/24/16  2314h    CHIEF COMPLAINT: Skin Rash/Abscess    ________________________________________________________________________        Lysle DingwallINTERESTED PARTIES:       NURSING UNIT:  ED    PRIMARY CARE PHYSICIAN:     OTHER COPIES TO:                                                    ________________________________________________________________________            History of Present Illness    Date of Service:  Jan 24, 2016    Chief Complaint    boil on the left buttock    Travelled outside the [place]:  No    Patient displaying symptoms:  No    40yo male presents to ED with boil of the left buttock times 3 days     tender to touch     unable to sit on chair without severe pain     no history of MRSA     no drainage no fevers chills or other complaints at this time        Review of Systems    Constitutional:  no symptoms reported    Eyes:  no symptoms reported    Ears, Nose, Throat, Mouth:  no ear symptoms reported, no nose symptoms reported, no throat symptoms   rprtd, no mouth symptoms reportd    Respiratory:  no symptoms reported    Cardiovascular:  no symptoms reported    Gastrointestinal:  no symptoms reported    Genitourinary:  no symptoms reported    Musculoskeletal:  no symptoms reported    Integumentary:  see HPI    Neurologic:  no symptoms reported    ALLERGIES:      Coded Allergies:           ketorolac tromethamine (Verified  Allergy, Mild, 01/06/16)     HIVES          codeine (Unverified  Allergy, Unknown, HIVES, 01/06/16)         ibuprofen (Verified  Allergy, Unknown, 01/06/16)     HIVES AND ITCHING         tramadol (Verified  Adverse Reaction, Unknown, "GAGGING", 01/06/16)    ------------------------------  HOME MEDICATIONS     Active    Penicillin V Potassium 500 Mg Tablet 500 Mg PO BID     Reported    Metoprolol Succinate (Er) (Metoprolol Succinate) 50 Mg Tabcr 50 Mg PO DAILY    Lipitor (Atorvastatin Calcium) 20 Mg Tablet 40 Mg PO DAILY    Aspir 81 (Aspirin) 81 Mg Tablet.dr 324 Mg PO DAILY            Past Medical History    Medical History:  heart disease, high cholesterol, hypertension, MI    Surgical History:  cholecystectomy, coronary bypass surgery    Psych History:  no pertinent history    Family History    Significant Family History:  heart disease        Social History    Smoking Status:  Heavy Tobacco Smoker    Alcohol Use:  occasionally    Drug Use:  oxycodone (Percocet), hydrocodone (Vicodin)        Physical Exam    VITAL SIGNS         Vital Signs              Date Time  Temp Pulse Resp B/P Pulse Ox O2 Delivery O2 Flow Rate FiO2         01/24/16 23:30 37.3 85 16 124/80 97 Room Air              Nursing Documentation Vitals:  reviewed by me    *    gen: awake and alert in no distress    head: ncat    eyes: perla, eomi    neck: trachea midline    skin: small 1cm abscess on the lateral aspect of the right buttock        Procedures (PD)    Incision and Drainage    Incision and Drainage :         Site:  Right Buttock       Location:  pelvis       Side:  Right       Skin Prep:  Yes       Anesthetic:  Lido 1%       Blade Size:  11       Drainage:  Small, Purulent       Culture Taken:  No       Wick Placed:  No       Sterile Dressing Applied?:  Yes       Equipment Used:  Scapel        Course    Medical Decision Making    40yo male with small abscess on buttock    i d at bedside     expression of pus noted     no cavity for packing needed      no cellulitis present abx not indicated        Discharge Plan    Clinical Impression:       Primary Impression:       Boil of buttock        Additional Instructions:    Keep covered for the next 24 hours     You may wash normally tomorrow as usual    Return to ED as needed if you worsen in any way            Camilia Caywood DO Jan 24, 2016 23:37        Signatures:   <Electronically signed by Cyndia SkeetersANDRE Harman Langhans DO> at 01/25/16 [phone]  Disclaimer: Portions of this document may contain text elements generated through computerized   voice recognition. Undetected computer generated word errors are possible. Please notify the   signing provider or return the document to [facility] Records department if clarification or   correction is needed.

## 2016-02-06 NOTE — ED Provider Notes (Addendum)
[facility]    [place], [place]                         REPORT TYPE:   Emergency Dept Provider Report             REPORT STATUS: Signed    ________________________________________________________________________        PATIENT NAME:      Dustin Carney, Dustin Carney   PATIENT ID #:   [id number]    BIRTHDATE:           [birth year]   ACCOUNT #:     [id number]    AGE:           40      ADMITTED:             GENDER:           M      ED PHYSICIAN: Graylin Shiver MD    LOC:          ED      ED ARRIVAL: 02/06/16  0733h    CHIEF COMPLAINT: Lower Extremity Injury    ________________________________________________________________________        Lysle Dingwall PARTIES:       NURSING UNIT:  ED    PRIMARY CARE PHYSICIAN:     OTHER COPIES TO:                                                    ________________________________________________________________________            History of Present Illness    Date of Service:  Feb 06, 2016    Chief Complaint    Ankle pain    Travelled outside the [place]:  No    Patient displaying symptoms:  No    Source:  patient    Dustin Carney is a 40 year old gentleman who presents with complaints of ankle pain. He states that   he twisted and popped very briefly yesterday while he was walking. He thinks it might of been an   inversion injury as he stepped off a curb. He is able to walk on it but is having pain. He's been   taking Tylenol without benefit. He denies any numbness or tingling. He is able walk on it. He has   no pain around his knee. The greatest pain is to the anterior lateral aspect of his right ankle. It   is worse when he walks, and pushes on it. It is also worse when he moves it. It does seem to be   swollen by his report. There is no redness or bruising.        Review of Systems    Musculoskeletal:  see HPI    Integumentary:  see HPI    Neurologic:  see HPI    Immunologic/Allergic:  medication allergies    ALLERGIES:      Coded Allergies:            ketorolac tromethamine (Verified  Allergy, Mild, 02/06/16)     HIVES         codeine (Unverified  Allergy, Unknown, HIVES, 02/06/16)         ibuprofen (Verified  Allergy, Unknown, 02/06/16)     HIVES AND ITCHING         tramadol (Verified  Adverse Reaction, Unknown, "GAGGING", 02/06/16)    ------------------------------         HOME MEDICATIONS     Active    Penicillin V Potassium 500 Mg Tablet 500 Mg PO BID     Reported    Metoprolol Succinate (Er) (Metoprolol Succinate) 50 Mg Tabcr 50 Mg PO DAILY    Lipitor (Atorvastatin Calcium) 20 Mg Tablet 40 Mg PO DAILY    Aspir 81 (Aspirin) 81 Mg Tablet.dr 324 Mg PO DAILY            Past Medical History    Medical History:  heart disease, high cholesterol, hypertension, MI    Surgical History:  cholecystectomy, coronary bypass surgery    Psych History:  no pertinent history    Family History    Significant Family History:  heart disease        Social History    Smoking Status:  Heavy Tobacco Smoker    Alcohol Use:  occasionally    Drug Use:  oxycodone (Percocet), hydrocodone (Vicodin)        Physical Exam    VITAL SIGNS         Vital Signs              Date Time  Temp Pulse Resp B/P Pulse Ox O2 Delivery O2 Flow Rate FiO2         02/06/16 07:54 36.8 84 20 123/80 96 Room Air              Nursing Documentation Vitals:  reviewed by me    *    General: Lying on his back asleep when I entered the room but awakens easily    HEENT: PERRLA, EOMI, normal conjuctiva    Neck: supple    OP: mucosa moist, OP clear    Lungs: No respiratory distress    Ext: warm, dry, well perfused with swelling tenderness the lateral right ankle with greatest   tenderness and swelling just anterior to the lateral malleolus, mild tenderness with palpation of   the lateral malleolus but no medial malleolus, proximal fifth metatarsal or proximal fibular   tenderness or swelling    Skin: See above    Neuro: Grossly intact with normal gait   normal speech    Capillary refill less than 3 seconds     2+ dorsalis pedis pulse on the right        Labs/Rads/ECGs    Radiology Impressions    ankle xray without fx    Image as visualized:  by me        Course    [facility]    Medical Decision Making    Mr. Dustin Carney is a [age] year old man who presents with ankle pain. He did have an x-ray that shows no   evidence of a fracture. He is made aware results. I suspect this is a mild sprain. He is put in an   Aircast. He is being discharged home with a diagnosis of ankle sprain. Discharge instructions   include:    Rest, ice and elevation.    Use air cast and crutches.    Aleve: 2 over-the-counter pills twice a day for the next 3 days then as needed for pain    Followup with your primary care provider for reevaluation. Call the office tomorrow to schedule an   appointment.    Return to the ED for uncontrolled pain or any other problems or concerns.  Discharge Plan    Clinical Impression:       Primary Impression:       Sprain of ankle     Qualified Code:  S93.401A - Sprain of unspecified ligament of right ankle, initial encounter        Additional Instructions:    Rest, ice and elevation.    Use air cast and crutches.    Aleve: 2 over-the-counter pills twice a day for the next 3 days then as needed for pain    Followup with your primary care provider for reevaluation. Call the office tomorrow to schedule an   appointment.    Return to the ED for uncontrolled pain or any other problems or concerns.    Condition:  Stable            Graylin Shiver MD Feb 06, 2016 09:49        Signatures:   <Electronically signed by Graylin Shiver MD> at 02/07/16 1017                                                                    Disclaimer: Portions of this document may contain text elements generated through computerized   voice recognition. Undetected computer generated word errors are possible. Please notify the   signing provider or return the document to [facility] Records  department if clarification or   correction is needed.

## 2016-03-29 NOTE — ED Provider Notes (Addendum)
[facility]    [place], [place]                         REPORT TYPE:   Emergency Dept Provider Report             REPORT STATUS: Signed    ________________________________________________________________________        PATIENT NAME:      Dustin Carney, Dustin Carney   PATIENT ID #:   [id number]    BIRTHDATE:           1975/11/12   ACCOUNT #:     [id number]    AGE:           40      ADMITTED:             GENDER:           M      ED PHYSICIANLara Mulch MD    LOC:          ED      ED ARRIVAL: 03/29/16  1558h    CHIEF COMPLAINT: Back Pain or Injury    ________________________________________________________________________        Lysle Dingwall PARTIES:       NURSING UNIT:  ED    PRIMARY CARE PHYSICIAN:     OTHER COPIES TO:                                                    ________________________________________________________________________            History of Present Illness    Date of Service:  Mar 29, 2016    Chief Complaint    left lower  back pain    Travelled outside the [place]:  No    Patient displaying symptoms:  No    Source:  patient    Patient is a 40 year old male who presents now to the emergency department with a complaint of left   lower back pain for the last week. The patient states that 1-1/2 weeks ago he was helping out at a   job site. He was approximately 3-4 feet up on a ladder when he fell off landing on his back. He   states he did not have any pain at the time and was able to get up and walk without difficulty. 3   or 4 days later, however, he states that he developed pain in his left lower back and this has been   persistent and worsening since that time. He states he has been using hot and cold compresses but   without relief. The pain is worsened with lying flat unless he has a pillow under the affected   area. It is also worsened with movement. He denies any associated abdominal pain. He denies any   difficulties urinating. He denies any bowel or bladder incontinence or any  weakness or numbness in   the arms or legs. He denies any history of prior surgeries to his back but states he has had   problems with low back pain in the past. He currently does not have a primary care provider because   he states that he recently moved back to [place] in June of this year from [place].        Review of Systems  Constitutional:  denies: chills, fever    Eyes:  no symptoms reported    Ears, Nose, Throat, Mouth:  no ear symptoms reported, no nose symptoms reported, no throat symptoms   rprtd    Respiratory:  no symptoms reported    Cardiovascular:  no symptoms reported    Gastrointestinal:  no symptoms reported,  denies: abdominal pain    Genitourinary:  no symptoms reported,  denies: incontinence    Musculoskeletal:  see HPI    Integumentary:  no symptoms reported,  denies: rash, sores    Neurologic:  denies: paresis, paresthesia    All Other Systems:  Reviewed and Negative    ALLERGIES:      Coded Allergies:           ketorolac tromethamine (Verified  Allergy, Mild, 02/06/16)     HIVES         codeine (Unverified  Allergy, Unknown, HIVES, 02/06/16)         ibuprofen (Verified  Allergy, Unknown, 02/06/16)     HIVES AND ITCHING         tramadol (Verified  Adverse Reaction, Unknown, "GAGGING", 02/06/16)    ------------------------------         HOME MEDICATIONS     Active    Tizanidine Hcl 4 Mg Tablet 4 Mg PO Q8H PRN    Penicillin V Potassium 500 Mg Tablet 500 Mg PO BID     Reported    Metoprolol Succinate (Er) (Metoprolol Succinate) 50 Mg Tabcr 50 Mg PO DAILY    Lipitor (Atorvastatin Calcium) 20 Mg Tablet 40 Mg PO DAILY    Aspir 81 (Aspirin) 81 Mg Tablet.dr 324 Mg PO DAILY            Past Medical History    Medical History:  heart disease, high cholesterol, hypertension, MI    Surgical History:  cholecystectomy, coronary bypass surgery    Psych History:  no pertinent history    Family History    Significant Family History:  heart disease        Social History     Smoking Status:  Heavy Tobacco Smoker    Alcohol Use:  occasionally    Drug Use:  oxycodone (Percocet), hydrocodone (Vicodin)        Physical Exam    VITAL SIGNS         Vital Signs              Date Time  Temp Pulse Resp B/P Pulse Ox O2 Delivery O2 Flow Rate FiO2         03/29/16 16:26 36.9 78 16 134/83 97 Room Air              Nursing Documentation Vitals:  reviewed by me    *    General: Lying on the stretcher in no acute distress, patient has apparent discomfort with changing   position on the stretcher    Head: Normocephalic, atraumatic    HEENT: Pupils equally round and reactive to light, gaze conjugate, no conjunctival pallor, moist   mucous membranes    Neck: No midline tenderness to palpation or step-off, supple, painless range of motion    Cardiovascular: Heart regular rate and rhythm without murmurs rubs or gallops, 2+ radial and DP   pulses bilaterally    Respiratory: Lungs clear to auscultation bilaterally, no tachypnea    Abdomen: Soft, nontender, nondistended    Musculoskeletal: No lower extremity edema, no deformities, no posterior calf tenderness to   palpation  or swelling, negative straight leg raise exam bilaterally, there is diffuse thoracic and   midline lumbar tenderness to palpation but without step off or overlying hematoma, there is what   appears to be a large area of ecchymosis over the left scapula but without reproducible tenderness   to palpation, there is diffuse left sided lumbar paraspinal tenderness to palpation without   palpable muscle spasm and no overlying ecchymoses or hematomas noted    Skin: Warm, dry, no pallor, see musculoskeletal exam    Neuro: Alert and oriented x3, moves all 4 extremities equally with 5 out of 5 strength diffusely   bilateral lower extremities, ambulates without ataxia or need for assistance, no focal neurologic   deficits noted        Labs/Rads/ECGs    Radiology Impressions    Lumbar spine x-ray as interpreted by me: No fractures or subluxations seen     Image as visualized:  by me        Course    Records Reviewed:  [facility]    Medical Decision Making    The patient was sent for x-ray for further evaluation of the lumbar spine as this is where he   indicates the point of maximum tenderness to palpation on my exam after a fall from a ladder one   and a half weeks ago. Most of his pain is in the left lower lumbar paraspinal region in the   muscular regions, however. He has no focal neurologic deficits. His straight-leg raise exam   bilaterally is negative making me less suspicious for herniated disc. There are no signs or   symptoms of cauda equina syndrome. He is denying incontinence or paresthesias. I know this patient   fairly well. He has a history of narcotic seeking behavior. He states that he recently moved back   to [place] in June of this year after living in [place] for some time. On review of the   prescription monitoring database I noted that the patient recently received a prescription at the   end of October for Vicodin. He also had a prescription for Vicodin from a different provider a   month or so before that. He was given a Lidoderm patch in the emergency department and 4 mg of oral   tizanidine.        The patient's x-ray shows no evidence of acute bony abnormalities that would explain his symptoms.   Almost immediately after arriving back from x-ray he told the nurse that his ride is leaving in a   couple of minutes and he would like to be discharged home. He did not request any further   medications. He actually left the emergency department before he could receive the discharge   instructions that I wrote for him. A couple of hours after I discharged this patient I also got a   call from his pharmacy. They wanted to warn me that this patient has a significant history of   narcotic seeking behavior. His pharmacist reports that the patient recently received prescriptions    for penicillin and #8 tablets of Vicodin for a dental infection but he refused to fill the   penicillin prescription which is still waiting for him at the pharmacy. 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  Discharge Plan    Clinical Impression:       Primary Impression:       Lumbosacral strain     Qualified Code:  S39.012A - Strain of muscle, fascia and tendon of lower back, initial encounter        Additional Instructions:    There is no evidence of any bony injuries on your x-ray of your back today    Your pain is suspected to be related to muscular contusion and/or strain    You may continue to apply hot compresses to the affected area for 20 minutes at a time 4 times   daily as needed    Take over-the-counter Tylenol as needed for pain control    Use the prescribed muscle relaxer as needed to help relax muscles and reduce discomfort    Your prescription has been automatically transmitted to your pharmacy for your convenience    Apply over-the-counter lidocaine patches as needed for additional pain control    Please call the [facility] referral service to set up a new primary care physician for further   care and evaluation as needed    Return to the emergency department as needed for any worsening especially for any new weakness or   numbness in the arms or legs, bowel or bladder incontinence or any other new concerning symptoms    Scripts    Tizanidine Hcl 4 Mg Tablet4 Mg PO Q8H PRN (MUSCLE SPASMS) #12 TABLET       Prov:Harbor Paster MD         03/29/16    Referrals:      [facility] REFERRAL [facility]    Condition:  Guy Franco MD Mar 29, 2016 17:40        Signatures:   <Electronically signed by Lara Mulch MD> at 03/29/16 2126                                                                    Disclaimer: Portions of this document may contain text elements generated  through computerized   voice recognition. Undetected computer generated word errors are possible. Please notify the   signing provider or return the document to [facility] Records department if clarification or   correction is needed.

## 2016-05-03 NOTE — Other (Addendum)
[facility]    [place], [place]                         REPORT TYPE:   Urgent Care Provider Report             REPORT STATUS: Signed    ________________________________________________________________________        PATIENT NAME:      Dustin BrackettSAUNDERS,Dustin L   PATIENT ID #:   [id number]    BIRTHDATE:           09/21/1975   ACCOUNT #:     [id number]    AGE:           40      REG DATE:       05/03/16      GENDER:           M          LOC:          [facility]   Urgent Care Arrival: 05/03/16  1729h    ________________________________________________________________________        Lysle DingwallINTERESTED PARTIES:       Location:  [facility]               ATTENDING PHYSICIAN: Forrester Blando M FNP          ADDITIONAL COPIES:  ;                                     ________________________________________________________________________    DICTANori Riis:  Azreal Stthomas M FNP        History of Present Illness    Date of Service:  May 03, 2016    Chief Complaint    tooth pain    Travelled outside the [place]:  No    Patient displaying symptoms:  No    40 year old male [facility] for evaluation of tooth pain.    Patient states that he had tooth pain for two weeks and then later stated he has been experiencing   pain in his tooth for two days.     Patient explains that he [facility].     Patient states that they removed some "derides" from his mouth but were not able to extract the   affected tooth due to facial swelling.     Instead [facility].     He has a follow up appt in 8 days for tooth extraction.     He has been taking Tylenol and ibuprofen alternating every 4 hours for tooth pain and just took a   dose of ibuprofen prior to today's visit.     His pain his currently rated  a 8/10 and he feels as if OTC medications are not working.     Patient explains that he is not experiencing any relief with Magic mouth wash.     Patient states he has been unable to sleep due to pain.    Pt says he has multiple allergies to pain medications but would like "something stronger."        He denies fever, chills, drooling, dyspnea, dysphagia, or change in voice.        Review of Systems    Constitutional:  no symptoms reported    Eyes:  no symptoms  reported    Ears, Nose, Throat, Mouth:  see HPI    Respiratory:  no symptoms reported    Cardiovascular:  no symptoms reported    Neurologic:  no symptoms reported    ALLERGIES:      Coded Allergies:           ketorolac tromethamine (Verified  Allergy, Mild, 05/03/16)     HIVES         codeine (Unverified  Allergy, Unknown, HIVES, 05/03/16)         ibuprofen (Verified  Allergy, Unknown, 05/03/16)     HIVES AND ITCHING         tramadol (Verified  Adverse Reaction, Unknown, "GAGGING", 05/03/16)    ------------------------------         HOME MEDICATIONS     Active    Penicillin V Potassium 500 Mg Tablet 500 Mg PO BID     Reported    Metoprolol Succinate (Er) (Metoprolol Succinate) 50 Mg Tabcr 50 Mg PO DAILY    Lipitor (Atorvastatin Calcium) 20 Mg Tablet 40 Mg PO DAILY    Aspir 81 (Aspirin) 81 Mg Tablet.dr 324 Mg PO DAILY            Past Medical History    Medical History:  heart disease, high cholesterol, hypertension, MI    Surgical History:  cholecystectomy, coronary bypass surgery    Psych History:  no pertinent history    Family History    Significant Family History:  heart disease        Social History    Smoking Status:  Light Tobacco Smoker    Alcohol Use:  occasionally    Drug Use:  oxycodone (Percocet), hydrocodone (Vicodin)        Physical Exam    VITAL SIGNS         Vital Signs              Date Time  Temp Pulse Resp B/P Pulse Ox O2 Delivery O2 Flow Rate FiO2         05/03/16 17:35 37.1 89 18 132/83 95 Room Air              Nursing Documentation Vitals:  reviewed by me    *    GENERAL: well-developed, well-nourished, in no distress    HEAD: atraumatic, normocephalic    EYES: PERRLA     EARS: clear auditory canals, TM's non-bulging and no redness     MOUTH: no lip lesions; buccal mucosa moist without lesions, mild tenderness to touch of #32 and   surrounding gingiva, mild caries of effected tooth, no tooth laxity or damage to tooth, enamel   appears mildly abraded; no gingival redness, edema, induration or fluctuance, missing teeth    OROPHARYNX: clear, no uvula deviation    NECK: supple, mild right cervical lymphadenopathy, full ROM    HEART: regular rate and rhythm    LUNGS: clear bilaterally    SKIN: normal for ethnicity        Course    Medical Decision Making    Vital signs stable. Patient afebrile in no acute distress. No dyspnea, cyanosis, drooling or   wheezing.         Patient presents with inconsistent history about pain duration and location. He has a hx of dental   pain and narcotic use. Multiple missing teeth. Gingiva without swelling, edema, or induration.   Tooth in right lower jaw tender without localized signs of infection or drainage.  Patient was offered magic mouth wash, but declines this as he has had it in the past ad it does not   work.         Unable to give pt naproxen due to cardiac history. He has codeine, tramadol, and ibuprofen listed   as allergies. Pt was offered a dental block but he declined this as he does not like needles. Pt   left without further treatment.        Discharge Plan    Clinical Impression:       Primary Impression:       Pain, dental        Additional Instructions:    Pt left without treatment.         Educated patient about oral hygiene and follow up with his dentist appt next week.    Condition:  Stable            Samyiah Halvorsen M FNP May 03, 2016 18:19        Electronically Signed By:   Nori RiisAMPBELL,Saaya Procell M FNP at 05/07/16 2110        Disclaimer: Portions of this document may contain text elements generated through computerized   voice recognition. Undetected computer generated word errors are possible. Please notify the    signing provider or return the document to [facility] Records department if clarification or   correction is needed.

## 2016-07-09 DIAGNOSIS — F331 Major depressive disorder, recurrent, moderate: Secondary | ICD-10-CM | POA: Insufficient documentation

## 2016-09-02 ENCOUNTER — Ambulatory Visit: Admitting: Emergency Medicine

## 2016-09-02 ENCOUNTER — Emergency Department
Admit: 2016-09-02 | Discharge status: Against Medical Advice | Source: Ambulatory Visit | Attending: Emergency Medicine | Admitting: Emergency Medicine

## 2016-09-02 ENCOUNTER — Emergency Department: Admit: 2016-09-02 | Discharge status: Against Medical Advice | Payer: No Typology Code available for payment source

## 2016-09-02 LAB — HX CHEM-PANELS
HX ANION GAP: 9 (ref 5–18)
HX BLOOD UREA NITROGEN: 13 mg/dL (ref 6–24)
HX CHLORIDE (CL): 109 meq/L (ref 98–110)
HX CO2: 24 meq/L (ref 20–30)
HX CREATININE (CR): 0.8 mg/dL (ref 0.57–1.30)
HX GFR, AFRICAN AMERICAN: 131 mL/min/{1.73_m2}
HX GFR, NON-AFRICAN AMERICAN: 113 mL/min/{1.73_m2}
HX GLUCOSE: 99 mg/dL (ref 70–139)
HX POTASSIUM (K): 4.2 meq/L (ref 3.6–5.1)
HX SODIUM (NA): 142 meq/L (ref 135–145)

## 2016-09-02 LAB — HX HEM-ROUTINE
HX BASO #: 0 10*3/uL (ref 0.0–0.2)
HX BASO: 0 %
HX EOSIN #: 0.1 10*3/uL (ref 0.0–0.5)
HX EOSIN: 2 %
HX HCT: 40.4 % (ref 37.0–47.0)
HX HGB: 13.1 g/dL — ABNORMAL LOW (ref 13.5–16.0)
HX IMMATURE GRANULOCYTE#: 0 10*3/uL (ref 0.0–0.1)
HX IMMATURE GRANULOCYTE: 0 %
HX LYMPH #: 2.1 10*3/uL (ref 1.0–4.0)
HX LYMPH: 37 %
HX MCH: 30.6 pg (ref 26.0–34.0)
HX MCHC: 32.4 g/dL (ref 32.0–36.0)
HX MCV: 94.4 fL (ref 80.0–98.0)
HX MONO #: 0.4 10*3/uL (ref 0.2–0.8)
HX MONO: 7 %
HX MPV: 10.3 fL (ref 9.1–11.7)
HX NEUT #: 3 10*3/uL (ref 1.5–7.5)
HX PLT: 241 10*3/uL (ref 150–400)
HX RBC BLOOD COUNT: 4.28 M/uL (ref 4.20–5.50)
HX RDW: 13 % (ref 11.5–14.5)
HX SEG NEUT: 53 %
HX WBC: 5.7 10*3/uL (ref 4.0–11.0)

## 2016-09-02 LAB — HX TRANSFUSION

## 2016-09-02 LAB — HX DIABETES: HX GLUCOSE: 99 mg/dL (ref 70–139)

## 2016-09-02 LAB — HX COAGULATION
HX INR PT: 1 (ref 0.9–1.3)
HX PROTHROMBIN TIME: 11.4 s (ref 9.7–14.0)
HX PTT: 30.9 s (ref 25.7–35.7)

## 2016-09-02 LAB — HX CHEM-ENZ-FRAC: HX TROPONIN I: <0.01 ng/mL (ref 0.00–0.03)

## 2016-09-02 NOTE — ED Provider Notes (Signed)
[name]  [name], [name]  MRN: [id number]  Age: 41 yrs  Sex: Male  DOB: 30-Jul-1975  Arrival Date: 09/02/2016  Arrival Time: 02:20  Account#: [id number]  Bed T2  PCP:  Chief Complaint: Chest Pain > 32 y/o  .  Presentation:  04/15  02:20 Presenting complaint: EMS states: Pt was walking to a family    jm42        members home to spend the night when he experienced sudden        onset LEFT sided chest pain that radiates down his LEFT arm        with no SOB. Pt walked into a nearby fire department and EMS        was notified at that time. Upon EMS arrival pt found seated and        noted to be alert with ongoing chest pain. Pt transported with        ALS, a 12-lead EKG appears to be sinus in nature. Pt given        Nitro SL x1 spray with strong effect of decreasing blood        pressure from 140/palp to 100/palp. Pt also given Aspirin 324        mg. Pt continues to have chest pain at triage.  02:20 Method Of Arrival: EMS: [facility]                              jm42  02:20 Acuity: Adult 2                                                 jm42  .  Historical:  - Allergies:  02:27 Tramadol HCl;                                                   jm42  02:27 Toradol;                                                        jm42  02:27 Motrin;                                                         jm42  02:27 Codeine;                                                        jm42  - Home Meds:  02:27 Metoprolol Tartrate Oral [Active]; Aspirin Oral [Active];       jm42        Lipitor Oral [Active];  - PMHx:  02:27 Hypertension;  jm42  - PSHx:  02:27 CABG; Cholecystectomy;                                          jm42  .  - Social history: Smoking status: Patient uses tobacco    products, current every day smoker.  - The history from nurses notes was reviewed: and I agree with    what is documented.  .  .  Screening:  02:27 SEPSIS SCREENING - Temp > 38.3 or < 36.0 No - Heart Rate > 90    jm42        No - Respiratory > 20 No - SBP < 90 No SIRS Criteria (> = 2) No.  .  Vital Signs:  02:27 BP 122 / 75; Pulse 81; Resp 14; Temp 36.6(O); Pulse Ox 94% on   jm42        R/A;  .  [name], [name]  [id number]  [id number]  Page 1 of 4  %%PAGE  .  [name], [name]  MRN: [id number]  Age: 13 yrs  Sex: Male  DOB: 12/29/1975  Arrival Date: 09/02/2016  Arrival Time: 02:20  Account#: [id number]  Bed T2  PCP:  Chief Complaint: Chest Pain > 32 y/o  .  03:06 BP 134 / 58; Pulse 78; Resp 18; Pulse Ox 98% ;                  lp7  03:06 BP 125 / 66; Pulse 77; Resp 18; Pulse Ox 98% on R/A;            lp7  03:06 BP 113 / 55; Pulse 80; Resp 18; Pulse Ox 99% ;                  lp7  03:38 BP 104 / 53; Pulse 75; Resp 18; Pulse Ox 98% on R/A;            lp7  .  Neuro Vital Signs:  02:27 GCS: 15,                                                        jm42  03:38 GCS: 15,                                                        lp7  .  Triage Assessment:  02:27 General: Appears comfortable, well nourished, well groomed,     jm42        Behavior is appropriate for age, cooperative. Pain: Complains        of pain in chest. Neuro: Level of Consciousness is awake,        alert, obeys commands, Oriented to person, place, time, Moves        all extremities. Speech is normal, Facial symmetry appears        normal. Cardiovascular: Capillary refill < 3 seconds.        Respiratory: Airway is patent Respiratory effort is even,        unlabored, Respiratory pattern is regular, symmetrical.  GI:        Abdomen is flat, non- distended. Skin: Skin is intact, is        healthy with good turgor, Skin is pink, warm / dry.  .  Assessment:  03:02 Reassessment: pt c.o of left side chest pain. Pt reprots it     lp7        feels like his last MI. Iv palced. ekg obtained. pt palced on        monitor. PA at bedside. General: Appears in no apparent        distress, comfortable, Behavior is cooperative. Pain: Complains        of pain in chest Pain  currently is 10 out of 10 on a pain        scale. Quality of pain is described as squeezing. Neuro: Level        of Consciousness is awake, alert, Oriented to person, place,        time, Grips are equal bilaterally Moves all extremities. Gait        is steady, Speech is normal. Cardiovascular: Capillary refill <        3 seconds Chest pain. Respiratory: Airway is patent Respiratory        effort is even, unlabored, O2 Sat continuous monitoring. GI:        Abdomen is obese. GU: No deficits noted. Skin: Skin is pink,        warm / dry.  04:37 Reassessment: pt denies any cp relief after nitro. pt VSS. pt   lp7        taken montior off pt requesting more pain medicaiton. PA made        aware.  04:55 Reassessment: Pt reports he is leaving AMA. Pt took off cardiac lp7        monitor. Pt refusing to stay for blood draw. IV removed. Pt        escorted out of department by secuirty.  [name]  [name], [name]  [id number]  [id number]  Page 2 of 4  %%PAGE  .  [name], [name]  MRN: [id number]  Age: 38 yrs  Sex: Male  DOB: [birth year]  Arrival Date: 09/02/2016  Arrival Time: 02:20  Account#: [id number]  Bed T2  PCP:  Chief Complaint: Chest Pain > 32 y/o  .  Observations:  02:20 Patient arrived in ED.                                          jm42  02:25 Triage Completed.                                               jm42  02:31 Patient Visited By: Rudean Curt                               m  02:45 Registration completed.                                         lf7  02:45 Patient Visited By: Frederik Pear  lf7  .  Procedure:  02:37 EKG done. (by ED staff). Old EKG Obtained Reviewed By: Orson Slick        Rideout MD.  03:02 BUN (Blood Urea Nitrogen) Sent.                                 lp7  03:02 CBC/Diff (With Plt) Sent.                                       lp7  03:02 CR (Creatinine) Sent.                                           lp7  03:02 GLU (Glucose) Sent.                                              lp7  03:02 LYTES (Na, K, Cl, Co2) Sent.                                    lp7  03:02 PT (Prothrombin Time With INR) Sent.                            lp7  03:02 PTT Sent.                                                       lp7  03:02 Troponin I Sent.                                                lp7  03:04 Inserted peripheral IV: 20 gauge in left hand.                  lp7  .  Dispensed Medications:  02:49 Drug: Nitrostat 0.4 mg Route: Sublingual;                       lp7  02:50 Drug: NS - Sodium Chloride 0.9% IV ml 1000 mL Route: IV; Rate:  lp7        Bolus;  02:59 Drug: Nitrostat 0.4 mg Route: Sublingual;                       lp7  03:39 Drug: Nitrostat 0.4 mg Route: Sublingual;                       lp7  .  .  Interventions:  02:33 Cardiac monitor on. Pulse ox on. NIBP on.                       jd27  02:36 ECG/EKG  Scanned into Chart                                      jm49  03:25 EMS Sheet Scanned into Chart                                    yp3  .  Outcome:  04:56 AMA Left before signing form. Condition: good Condition:        lp7        stable. Discharge Assessment: Patient awake, alert and oriented        x 3. No cognitive and/or functional deficits noted. Patient        verbalized understanding of disposition instructions. Patient        awake and alert. Chart Status Nursing note complete and        electronically signed. Downtime chart-see attachments for        scanned copy of paper chart and order sheet.  05:10 Patient left against medical advice.                            m  .  [name], [name]  [id number]  [id number]  Page 3 of 4  %%PAGE  .  [name], [name]  MRN: [id number]  Age: 24 yrs  Sex: Male  DOB: [birth year]  Arrival Date: 09/02/2016  Arrival Time: 02:20  Account#: [id number]  Bed T2  PCP:  Chief Complaint: Chest Pain > 32 y/o  .  05:12 Patient left the ED.                                            lp7  .  Signatures:  Shellia Cleverly                          RN   [address], [place]                          CCT  jd27  Ellard Artis                        Sec  [address] [place]                                lf7  [place], [place]                           [place]   m  Powers, Lauren                          BSN  lp7  Edison Pace                         Sec  yp3  Azalee Course                        [phone]  .  .  .  .  .  .  .  .  .  .  .  .  .  .  .  .  .  .  .  .  .  .  .  .  .  .  .  .  .  .  .  [name]  [name], [name]  [id number]  [id number]  Page 4 of 4  .  %%END

## 2016-09-02 NOTE — ED Provider Notes (Signed)
[name]  [name], [name]  MRN: [id number]  Age: 41 yrs  Sex: Male  DOB: Oct 13, 1975  Arrival Date: 09/02/2016  Arrival Time: 02:20  Account#: [id number]  .  Working Diagnosis: Chest pain, unspecified  PCP:  .  HPI:  04/15  02:48 This 41 yrs old Black Male presents to ER via EMS with          m        complaints of Chest Pain > 78 y/o.  02:48 Mr. 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 Pt reports he was walking to aunt's        house when he suddenly developed severe L sided CP c/w past MI.        CP was associated with lightheadedness and nausea. Pain rad to        L arm and jaw. Pt was given ASA 324mg  and nitro spray x 1 with        some relief of CP. Pt still c/o pain in ED.  [name]  Historical:  - Allergies: Tramadol HCl; Toradol; Motrin; Codeine;  - Home Meds: Metoprolol Tartrate Oral; Aspirin Oral; Lipitor Oral;  - PMHx: Hypertension;  - PSHx: CABG; Cholecystectomy;  - Social history: Smoking status: Patient uses tobacco    products, current every day smoker.  - The history from nurses notes was reviewed: and I agree with    what is documented.  .  .  ROS:  03:15 Constitutional: Negative for fever, chills Eyes: Negative for   m        injury, pain, redness, and discharge, ENT: Negative for injury,        pain, and discharge, Respiratory: Negative for shortness of        breath, cough MS/Extremity: Negative for injury and deformity,        pain or swelling in the calves Skin: Negative for injury, rash        Psych: Negative for depression, anxiety, suicide ideation,        homicidal ideation, and hallucinations.  03:15 Cardiovascular: Positive for chest pain.  03:15 Abdomen/GI: Positive for nausea, Negative for abdominal pain,        vomiting, diarrhea.  03:15 Neuro: Positive for Lightheadedness  .  Vital Signs:  02:27 BP 122 /  75; Pulse 81; Resp 14; Temp 36.6(O); Pulse Ox 94% on   jm42        R/A;  03:06 BP 134 / 58; Pulse 78; Resp 18; Pulse Ox 98% ;                  lp7  03:06 BP 125 / 66; Pulse 77; Resp 18; Pulse Ox 98% on R/A;            lp7  .  Name:Lafevers, [name]  [id number]  [id number]  Page 1 of 5  %%PAGE  .  Name: Hazim, Treadway  MRN: [id number]  Age: 34 yrs  Sex: Male  DOB: [birth year]  Arrival Date: 09/02/2016  Arrival Time: 02:20  Account#: [id number]  .  Working Diagnosis: Chest pain, unspecified  PCP:  .  03:06 BP 113 / 55; Pulse 80;  Resp 18; Pulse Ox 99% ;                  lp7  03:38 BP 104 / 53; Pulse 75; Resp 18; Pulse Ox 98% on R/A;            lp7  .  Neuro Vital Signs:  02:27 GCS: 15,                                                        jm42  03:38 GCS: 15,                                                        lp7  .  Exam:  03:15 Constitutional: The patient appears well nourished, alert, well m        developed, awake, obese, in obvious pain.  03:15 Eyes: Pupils: equal, round, and reactive to light. Extraocular        movements: intact throughout.  03:15 ENT: Mouth: Oral mucosa: moist.  03:15 Respiratory: the patient does not display signs of respiratory        distress,  Respirations: normal, Breath sounds: are normal,        clear throughout.  03:15 Cardiovascular: Rate: normal, Rhythm: regular, Heart sounds:        normal, no murmur, no rub, no gallop.  03:15 Abdomen/GI: Palpation: abdomen is soft and non-tender.  03:15 Musculoskeletal/extremity: Circulation: Calf tenderness, is        absent.  03:15 Neuro: Orientation: to person, place / time. Mentation: lucid,        able to follow commands.  03:15 Psych: Behavior/mood is cooperative.  03:15 Skin: Appearance: Temperature: warm, Moisture: dry, diaphoresis        is not appreciated.  .  MDM:  05:19 The patient was not given aspirin in the Emergency Department.  m        The patient has taken aspirin within the past 24 hours. ED        course: 41 yo M with  hx of MI and CABG x2 presents with L sided        CP. ASA and nitro x 1 given PTA. Nitro x 3 given in ED without        significant relief of CP. EKG unchanged from prior and initial        troponin negative. While waiting for attending evaluation, pt        became frustrated that he was still having pain and left the ED        prior to completing evaluation. Data reviewed: lab test        result(s), nurses notes, vital signs. My portion of the ED        chart is complete / electronically signed: Melissa Bowe PA-C.  .  04/15  02:34 Order name: BUN (Blood Urea Nitrogen); Complete Time: 03:33     m  04/15  .  Name:Cena, Amelio  [id number]  [id number]  Page 2 of 5  %%PAGE  .  Name: Nation, Cradle  MRN: [id number]  Age: 74 yrs  Sex: Male  DOB: 1976/04/13  Arrival Date: 09/02/2016  Arrival Time: 02:20  Account#: [id number]  .  Working Diagnosis: Chest pain, unspecified  PCP:  .  02:34 Order name: CBC/Diff (With Plt); Complete Time: 03:33           m  04/15  02:34 Order name: CR (Creatinine); Complete Time: 03:33               m  04/15  02:34 Order name: GLU (Glucose); Complete Time: 03:33                 m  04/15  02:34 Order name: LYTES (Na, K, Cl, Co2); Complete Time: 03:33        m  04/15  02:34 Order name: PT (Prothrombin Time With INR); Complete Time: 04:15m  04/15  02:34 Order name: PTT; Complete Time: 04:10                           m  04/15  02:34 Order name: Troponin I; Complete Time: 04:10                    m  04/15  03:23 Order name: Blood Bank Hold; Complete Time: 03:33               dispa  t  04/15  03:31 Order name: GFR, AA; Complete Time: 03:33                       dispa  t  04/15  03:31 Order name: GFR, NAA; Complete Time: 03:33                      dispa  t  04/15  04:11 Order name: Troponin I: DRAW at 5:30am                          m  04/15  04:37 Order name: CXR (PA/Lat)                                        m  04/15  02:34 Order name: Adult EKG (order using folder); Complete Time:  02:39m  04/15  02:34 Order name: Cardiac Monitor; Complete Time: 02:36               m  04/15  02:34 Order name: EKG (order using folder); Complete Time: 02:36      m  04/15  02:34 Order name: Pulse Oximetry Continuous; Complete Time: 02:37     m  .  Dispensed Medications:  02:49 Drug: Nitrostat 0.4 mg Route: Sublingual;                       lp7  02:50 Drug: NS - Sodium Chloride 0.9% IV ml 1000 mL Route: IV; Rate:  lp7        Bolus;  02:59 Drug: Nitrostat 0.4 mg Route: Sublingual;                       lp7  03:39 Drug: Nitrostat 0.4 mg Route: Sublingual;                       lp7  .  .  Radiology Orders:  Order Name: CXR (PA/Lat); Last Status:  Ordered; Time: 09/02/16  .  Name:Ahrendt, Clair  [id number]  [id number]  Page 3 of 5  %%PAGE  .  Name: Jex, Strausbaugh  MRN: [id number]  Age: 84 yrs  Sex: Male  DOB: 11/02/1975  Arrival Date: 09/02/2016  Arrival Time: 02:20  Account#: [id number]  .  Working Diagnosis: Chest pain, unspecified  PCP:  .    04:37; By: m ; For: m ; Order Method: Electronic; Notes: Bed    Name: T2  Attending Notes:  04:44 Attestation: Assessment and care plan reviewed with             jr9/n  m5        resident/midlevel provider. See their note for details.        Physician Assistant's history reviewed, patient interviewed and        examined. Attending HPI: Social History: Smoker Family History:        No one sick at home HPI: 41 yo M on metoprolol, aspirin,        lipitor with PMHx of MI (at age 25, 64, 82) and CABG in 2000        and 2010, CAD, HTN. Pt reports walking to aunt's house feeling        squeezing chest pain that radiates to left arm and jaw. This        acute onset chest pain lasted for 20 minutes. Chest pain feels        like a squeezing pain and is associated with lightheadedness        and nausea. Pain rated at 10/10. Pt had heart surgery performed        here at [facility]. Denies SOB, vomiting. Endorses chest pain, HA.        Aspirin and nitro spray x1 prior to arrival. Pt  reports nitro        has not been efficient in treating his pain. He does not        presently have a cardiologist. PSHx of CABG, cholecystectomy.        Pt lives in [place] but moved back to care for his mom. Reviewed        Nurses Notes, Old Records in: and I agree.  04:45 Attending ROS Constitutional: Negative for fever, Eyes:         jr9/n  m5        Negative for injury or acute deformity, ENT: Negative for        hearing loss, Neck: Negative for injury or acute deformity,        Cardiovascular: Positive for chest pain, Respiratory: Negative        for shortness of breath, Abdomen/GI: Negative for vomiting,        Back: Negative for injury or acute deformity, MS/Extremity:        Negative for laceration, Skin: Negative for laceration(s),        Neuro: Positive for headache.  .  Disposition Summary:  05:12 09/02/2016 05:10 Patients has left against medical advice.      m        Impression: Chest pain, unspecified. Patient states they are        going to Home. Condition is Stable. Discharge Instructions:        CHEST PAIN, Uncertain Cause. Follow up: Private Physician;        When: Today; Reason: Continuance of care. Problem is new.        Symptoms are unchanged.  .  Signatures:  Dispatcher, [employer]                          dispa  Shellia Cleverly                         RN   [address], [address]  [place], [place]                           [place]   m  .  Name:Graves, Taryll  [id number]  [id number]  Page 4 of 5  %%PAGE  .  Name: Firas, Guardado  MRN: [id number]  Age: 77 yrs  Sex: Male  DOB: 12/24/1975  Arrival Date: 09/02/2016  Arrival Time: 02:20  Account#: [id number]  .  Working Diagnosis: Chest pain, unspecified  PCP:  .  Natalie Mok                                  nm5  Powers, Lauren                          BSN  lp7  .  Corrections: (The following items were deleted from the chart)  02:59 02:48 Mr. 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 Pt reports walking to aunt's house feeling squeezing        chest pain that radiates to left arm and jaw. This acute onset        chest pain lasted for 20 minutes. Chest pain feels like a        squeezing pain and is associated with lightheadedness and        nausea. Pain rated at 10/10. Pt had heart surgery performed        [facility] at [facility]. Denies SOB, vomiting, SOB. Endorses chest pain,        HA. Aspirin and nitro spray x1 prior to arrival. Pt reports        nitro has not been efficient in treating his pain. He does not        presently have a cardiologist. PSHx of CABG, cholecystectomy.        Pt lives in [place] jr9/nm5  04:51 04:44 Attending HPI: Social History: Smoker Family History: No  jr9/n  m5        one sick at home HPI: 41 yo M on metoprolol, aspirin, lipitor  with PMHx of MI (at age 83, 43, 51) and CABG in 2000 and 2010,        CAD, HTN. Pt reports walking to aunt's house feeling squeezing        chest pain that radiates to left arm and jaw. This acute onset        chest pain lasted for 20 minutes. Chest pain feels like a        squeezing pain and is associated with lightheadedness and        nausea. Pain rated at 10/10. Pt had heart surgery performed        here at [facility]. Denies SOB, vomiting. Endorses chest pain, HA.        Aspirin and nitro spray x1 prior to arrival. Pt reports nitro        has not been efficient in treating his pain. He does not        presently have a cardiologist. PSHx of CABG, cholecystectomy.        Pt lives in [place] jr9/nm5  04:58 04:49 Attending Exam: My personal exam reveals Pt  wants to      jr9/n  m5        leave AMA before my exam. jr9/nm5  05:12 05:10 09/02/2016 05:10 Patients has left against medical        lp7        advice. Impression: Chest pain, unspecified. Patient states        they are going to Home. Condition is Stable. Follow up: Private        Physician; When: Today; Reason: Continuance of care. Problem is        new. Symptoms are unchanged. m  .  .  Name:Sayre, Calixto  [id number]  [id number]  Page 5 of 5  %%PAGE  .  Name: Robie, Mcniel  MRN: [id number]  Age: 89 yrs  Sex: Male  DOB: 06-29-1975  Arrival Date: 09/02/2016  Arrival Time: 02:20  Account#: [id number]  .  Working Diagnosis: Chest pain, unspecified  PCP:  .  Document is preliminary until electronically or manually signed by the atte  nding physician  .  .  .  .  .  .  .  .  .  .  .  .  .  .  .  .  .  .  .  .  .  .  .  .  .  .  .  .  .  .  .  .  .  .  .  .  .  .  .  .  .  .  Name:Ky, Zyan  [id number]  [id number]  Page 6 of 5  .  %%END

## 2016-09-09 LAB — BMP (EXT)
Anion Gap (EXT): 16 mmol/L (ref 3–17)
BUN (EXT): 12 mg/dL (ref 8–25)
CO2 (EXT): 23 mmol/L (ref 23–32)
CalciumCalcium (EXT): 9.3 mg/dL (ref 8.5–10.5)
Chloride (EXT): 103 mmol/L (ref 98–108)
Creatinine (EXT): 1.17 mg/dL (ref 0.60–1.50)
GFR Estimated (Calc) (EXT): 79 mL/min/{1.73_m2} (ref >59)
Glucose (EXT): 180 mg/dL — ABNORMAL HIGH (ref 70–110)
Potassium (EXT): 3.3 mmol/L — ABNORMAL LOW (ref 3.4–5.0)
Sodium (EXT): 142 mmol/L (ref 135–145)

## 2016-09-10 NOTE — Progress Notes (Addendum)
Visit Type:  Office Visit  Primary Provider:  Cyndia Diver MD    CC:  discuss pain management for knee.    History of Present Illness:  Pt is 41 year old M who presents for f/u visit for pain management.  Patient was seen by orthopedic surgery one month ago and was told that he likely had mild arthritis.  Possibly it was a lateral meniscal tear but the plan is to do PT first and if that is not effective to go to MRI.  He will do the PT.  However, when he asked about pain medicine, orthopedics deferred to me.  He is in 10/10 pain of his right knee and just would like something to get through this until he starts physical therapy.   He also would like to request referral for counseling because of increased stress in his life    Current Meds:   ADULT ASPIRIN LOW STRENGTH 81 MG ORAL TABLET DISINTEGRATING (ASPIRIN) Take one pill by mouth daily  METOPROLOL SUCCINATE ER 50 MG ORAL TABLET EXTENDED RELEASE 2 (METOPROLOL SUCCINATE) Take one pill by mouth daily  LIPITOR 20 MG ORAL TABLET (ATORVASTATIN CALCIUM) Take one pill by mouth daily          Allergies at the time of visit:  TRAMADOL HCL (Severe)  TORADOL (Severe)  MORPHINE (Severe)  MOTRIN (Severe)  CODEINE (Severe)      Past Medical History:     Reviewed history from 06/26/2016 and no changes required:        CABG x 1, 2007, due to congenital heart disease--patient's explanation is that right coronary artery was never in the proper location (perfused left side of heart only) and he had repeat cabage surgery in 2012 to correct this.        CAD, MI x 3        Chronic Dental pain - multiple teeth removed        Chronic knee pain     Past Surgical History:     Reviewed history from 07/02/2016 and no changes required:        2 bypass surgeries        Chelecystectomy 08/2010        Tooth extraction 11/30/13    Family History Summary:      Reviewed history Last on 07/02/2016 and no changes required:07/09/2016   Father Baker Pierini.) - Has Family History of Diabetes - Entered On: 09/25/2013  Mother Baker Pierini.) - Has Family History of Heart Disease - Entered On: 09/25/2013  Brother (full) - Has History of Diabetes - Entered On: 06/26/2016  Brother (full) - Has History of Heart Disease - Entered On: 06/26/2016    General Comments - FH:  Mother:  Heart disease, brain cancer   Father: DM   6 siblings - 1 with DM II, 1 with heart disease  Paternal Grandmother:  Heart disease   Paternal Grandfather:  Heart disease  Maternal Gramdmother: Dementia, heart disease   Mathernal Grandfather: Heart disease       Social History:     Reviewed history from 06/26/2016 and no changes required:        He is from [place] but moved to [place] at age 57.  Has been between [place] and [address]        Lives with wife, married since 2016         No children        Disability secondary to heart disease and syncopal episode  About 40-50 pack years, used to smoke 2 PPD but now down to 2 cigarettes a day        No EtOH        No drug use      Review of Systems        See HPI        Vital Signs   Weight: 278.6 pounds Change since last visit: -3.40 pounds   BMI: 36.75  Pulse rate: 70  Pulse rhythm: regular  Blood Pressure: 124/70 mm Hg  Cuff Size: large adult    Nursing Documentation   Vitals performed and patient identified by full name and DOB: Dustin Carney July 09, 2016 4:01 PM  Medications reviewed: Dustin Carney July 09, 2016 4:01 PM  Allergies reviewed: Dustin Carney July 09, 2016 4:01 PM  Visual Analogue Pain Scale: 10 (Unbearable Pain)  Faces Pain Rating Scale: 1 (Hurts Little Bit)    Behavioral Observation Pain Rating Scale   Face: 1  Legs: 0  Activity: 0  Cry: 0  Consolability: 1  Total Score: 2      Physical Exam    General:      well developed, well nourished, in no acute distress.        Blood Pressure:  Today's BP: 124/70 mm Hg    Labwork:   Most Recent Lab Results:   LDL: 148 mg/dL 09/81/1914        COPD Screening is Negative          Problem # 1:  KNEE PAIN, RIGHT, CHRONIC (ICD-719.46) (ICD10-M25.561)  Assessment: Unchanged  Patient with several years of right knee pain.  Unclear what etiology is but possibly lateral meniscus tear.  Only now is he working on fixing it.  Frankly i do not want him on chronic narcotics for this condition.  Will give him 30 tabs of percocet 5-325 TID to use for pain but this is the only script i am giving him.    Percocet 5-325 mg TID PRN   C/W orthopedic follow up  Physical therapy in 2 weeks    Problem # 2:  MAJOR DEPRESSIVE DISORDER, RECURRENT EPISODE, MODERATE (ICD-296.32) (ICD10-F33.1)  Assessment: Unchanged  Will refer to counseling    .............................. by Nash Dimmer MD, Luisa Hart at July 09, 2016 4:31 PM          Prescriptions:  PERCOCET 5-325 MG ORAL TABLET (OXYCODONE-ACETAMINOPHEN) Take 1 tab by mouth TID PRN for severe (7-10/10) knee pain Dx: acute knee pain  #30 tablet x 0      Entered and Authorized by:  Cyndia Diver MD      Signed by:  Cyndia Diver MD on 07/09/2016      Method used:    Print then Give to Patient      RxID:   [id number]        Medications:  Removed medication of PENICILLIN V POTASSIUM 500 MG ORAL TABLET (PENICILLIN V POTASSIUM) Take 1 tab by mouth three times daily for 7 days  Added new medication of PERCOCET 5-325 MG ORAL TABLET (OXYCODONE-ACETAMINOPHEN) Take 1 tab by mouth TID PRN for severe (7-10/10) knee pain Dx: acute knee pain - Signed  Rx of PERCOCET 5-325 MG ORAL TABLET (OXYCODONE-ACETAMINOPHEN) Take 1 tab by mouth TID PRN for severe (7-10/10) knee pain Dx: acute knee pain;  #30 tablet x 0;  Signed;  Entered by: Cyndia Diver MD;  Authorized by: Cyndia Diver MD;  Method used: Print then Give to Patient  Problems:  Added new problem of MAJOR DEPRESSIVE DISORDER, RECURRENT EPISODE, 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

## 2016-09-10 NOTE — Progress Notes (Addendum)
Visit Type:  new patient  Primary Provider:  Cyndia Diver MD      History of Present Illness:  PT is 41 year old M who presents for new patietn visit.  His last doctor was in [place] and needed to reestablish care.  Patient has two complaints, one is tooth pain which is chronic and he is going to dentist later this week to have extractions done.  He requests penicillin and vicodin to help with pain.  Second, he has chronic right knee pain over the last year, 10/10 pain, present all the time but worse with activitiy.  He has popping and locking sensation when he walks and has fallen twice.  Was told in the past he had meniscal injury and probably needed a repair    Current Meds:   ADULT ASPIRIN LOW STRENGTH 81 MG ORAL TABLET DISINTEGRATING (ASPIRIN) Take one pill by mouth daily  METOPROLOL SUCCINATE ER 50 MG ORAL TABLET EXTENDED RELEASE 2 (METOPROLOL SUCCINATE) Take one pill by mouth daily  LIPITOR 20 MG ORAL TABLET (ATORVASTATIN CALCIUM) Take one pill by mouth daily        Allergies at the time of visit:  TRAMADOL HCL (Severe)  TORADOL (Severe)  MORPHINE (Severe)  MOTRIN (Severe)  CODEINE (Severe)      Past Medical History:     Reviewed history from 01/18/2014 and no changes required:        CABG x 1, 2007, due to congenital heart disease--patient's explanation is that right coronary artery was never in the proper location (perfused left side of heart only) and he had repeat cabage surgery in 2012 to correct this.        CAD, MI x 3        Chronic Dental pain - multiple teeth removed        Chronic knee pain     Past Surgical History:     Reviewed history from 01/18/2014 and no changes required:        2 bypass surgeries        Chelecystectomy 08/2010        Tooth extraction 11/30/13    Family History Summary:      Reviewed history Last on 01/13/2014 and no changes required:06/26/2016  Father Baker Pierini.) - Has Family History of Diabetes - Entered On: 09/25/2013   Mother Baker Pierini.) - Has Family History of Heart Disease - Entered On: 09/25/2013  Brother (full) - Has History of Diabetes - Entered On: 06/26/2016  Brother (full) - Has History of Heart Disease - Entered On: 06/26/2016    General Comments - FH:  Mother:  Heart disease, brain cancer   Father: DM   6 siblings - 1 with DM II, 1 with heart disease  Paternal Grandmother:  Heart disease   Paternal Grandfather:  Heart disease  Maternal Gramdmother: Dementia, heart disease   Mathernal Grandfather: Heart disease       Social History:     Reviewed history from 01/18/2014 and no changes required:        He is from [place] but moved to [place] at age 7.  Has been between [place] and [address]        Lives with wife, married since 2016         No children        Disability secondary to heart disease and syncopal episode        About 40-50 pack years, used to smoke 2 PPD but now down to 2  cigarettes a day        No EtOH        No drug use      Review of Systems        See HPI        Vital Signs   Height: 73 inches  Weight: 280.4 pounds Change since last visit: 25.20 pounds   BMI: 36.99  Pulse rate: 82  Pulse rhythm: regular  Respirations: 18  Blood Pressure: 128/84 mm Hg  Cuff Size: large adult    Nursing Documentation   Vitals performed and patient identified by full name and DOB: Merrilee Jansky MA June 26, 2016 8:06 AM  Medications reviewed: Merrilee Jansky MA June 26, 2016 8:06 AM  Allergies reviewed: Merrilee Jansky MA June 26, 2016 8:06 AM  Influenza vaccine not adminstered: patient declines      Patient Health Questionnaire (PHQ-9) (c) 1999 [employer]   Usage: SCREENING PHQ-9 (the patient does not have a diagnosis of depression)  Over the LAST 2 WEEKS, how often have you been bothered by any of the following problems?  Responses: 0-Not at all, 1-Several Days, 2-More than half the days, 3-Nearly every day  1. Little interest or pleasure in doing things: 1  2. Feeling down, depressed or hopeless: 0   Screen Positive for Depression: No  Severity (Total) Score: 1  Symptom Severity: 0-4 No depression  Treatment Recommended: 0-4 Consider other diagnosis.      Physical Exam    General:      well developed, well nourished, in no acute distress.    Eyes:      PERRL/EOM intact, conjunctiva and sclera clear with out nystagmus.    Ears:      TM's intact and clear with normal canals with grossly normal hearing.    Mouth:      Very poor dentition.  Gingival swelling  Neck:      no masses, thyromegaly, or abnormal cervical nodes.    Respiratory:      clear bilaterally to auscultation.    Heart:      non-displaced PMI, chest non-tender; regular rate and rhythm, S1, S2 without murmurs, rubs, or gallops  GI:       normal bowel sounds; no hepatosplenomegaly no ventral,umbilical hernias or masses noted.    Msk:      Right knee joint line tenderness, medial worse than lateral.  Pain with flexion and full extension.  Macmurray positive.  No obvious effusion      Blood Pressure:  Today's BP: 128/84 mm Hg    Labwork:   Most Recent Lab Results:   LDL: 148 mg/dL 16/02/9603        COPD Screening is Negative     CAGE-AID questionnaire   (1 or more yes response is a positive screen)   1. Have you ever felt that you ought to cut down on your drinking or drug use? no  2. Have people annoyed you by criticizing your drinking or drug use? no  3. Have you ever felt bad or guilty about your drinking or drug use?     no  4. Have you ever had a drink or used drugs first thing in the morning to steady your nerves or get rid of a hangover?   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  Resulted in numerous heart attacks and had bypass surgeries as a result.  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  Has not seen orthopedic surgery in a while  - Orthopedic referral given    Problem # 3:  POOR DENTITION (ICD-520.9) (ICD10-K00.9)  Patient does have poor dentition and is asking for penicillin and vicodin.  I happily gave him the penicillin but told him as this is the first visit i would not be giving him vicodin as the penicillin is usually enough.  He wasn't very happy with that answer    RTC in 1 year for annual    .............................. by [facility] MD, Luisa Hart at June 26, 2016 8:46 AM        Process Orders  Check Orders Results:      Lab (printed in practice): ABN not required for this insurance.  Tests Sent for requisitioning (June 26, 2016 8:21 AM):      06/26/2016: Lab (printed in practice) -- Lipid Panel**     (LIPP) [80061] (signed)      06/26/2016: Lab (printed in practice) -- CMP -Comprehensive Metabolic Panel   (COMP) [80053] (signed)      06/26/2016: Lab (printed in practice) -- Glycohemoglobin   (GHGB) [83036] (signed)        Prescriptions:  PENICILLIN V POTASSIUM 500 MG ORAL TABLET (PENICILLIN V POTASSIUM) Take 1 tab by mouth three times daily for 7 days  #21[Tablet] x 0      Entered and Authorized by:  Cyndia Diver MD      Signed by:  Cyndia Diver MD on 06/26/2016      Method used:    Electronically to               [facility] (retail)              [address]              [place], [place]  [id number]              Ph: [phone]              Fax: [phone]      RxID:   [id number]        Influenza Immunization History:     Influenza # 1:  declined (06/26/2016)    Medications:  Removed medication of HYDROCODONE-ACETAMINOPHEN 5-325 MG ORAL TABLET (HYDROCODONE-ACETAMINOPHEN) 1 pill by mouth three times a day as needed for pain   Removed medication of PENICILLIN V POTASSIUM 500 MG ORAL TABLET (PENICILLIN V POTASSIUM) one tab by mouth four times daily x 7 days  Added new medication of PENICILLIN V POTASSIUM 500 MG ORAL TABLET (PENICILLIN V POTASSIUM) Take 1 tab by mouth three times daily for 7 days - Signed  Rx of PENICILLIN V POTASSIUM 500 MG ORAL TABLET (PENICILLIN V POTASSIUM) Take 1 tab by mouth three times daily for 7 days;  #21[Tablet] x 0;  Signed;  Entered by: Cyndia Diver MD;  Authorized by: Cyndia Diver MD;  Method used: Electronically to [facility] [place], [place]  [id number], Ph: [phone], Fax: [phone]  Problems:  Changed problem from LEG PAIN, RIGHT (  ICD-729.5) (ICD10-M79.604) to KNEE PAIN, RIGHT, CHRONIC (ICD-719.46) (ICD10-M25.561)  Removed problem of ANGER (ICD-312.00) (ICD10-R45.4)  Removed problem of UNSPECIFIED DISORDER TEETH&SUPPORTING STRUCTURES (ICD-525.9) (EAV40-J81.9)  Removed problem of DENTAL PAIN (ICD-525.9) (XBJ47-W29.8)  Removed problem of KNEE PAIN, RIGHT (ICD-719.46) (ICD10-M25.561)  Removed problem of KNEE PAIN, LEFT (ICD-719.46) (ICD10-M25.562)  Added new problem of PREDIABETES (ICD-790.29) (ICD10-R73.03)  Added new problem of CORONARY ARTERY DISEASE (ICD-414.00) (ICD10-I25.10)  Added new problem of POOR DENTITION (ICD-520.9) (ICD10-K00.9)  Assessed KNEE PAIN, RIGHT, CHRONIC as deteriorated  Allergies:  Changed allergy or adverse reaction from * TRAMIDOL (Critical) to TRAMADOL HCL (Severe)  Changed allergy or adverse reaction from * TORIDOL (Critical) to TORADOL (Severe)  Changed allergy or adverse reaction from * MORPHINE (Critical) to MORPHINE (Severe)  Changed allergy or adverse reaction from * MOTRIN (Critical) to MOTRIN (Severe)  Changed allergy or adverse reaction from CODEINE (Critical) to CODEINE (Severe)  Orders:  Added new Test order of Lipid Panel**     (LIPP) (56213) - Signed  Added new Test order of CMP -Comprehensive Metabolic Panel   (COMP)  (08657) - Signed  Added new Test order of Glycohemoglobin   (GHGB) (84696) - Signed  Added new Referral order of Orthopaedics-Consult (*) - Signed  Added [place] order of [id number]     New Patient Office or Other OP Visit - 30 min ([id number]) - Signed    ]    Assessment of Cognitive Impairment     CAGE-AID Questionnaire(1 or more yes response is a positive screen)   1. Have you ever felt that you ought to cut down on your drinking or drug use? no  2. Have people annoyed you by criticizing your drinking or drug use? no  3. Have you ever felt bad or guilty about your drinking or drug use?     no  4. Have you ever had a drink or used drugs first thing in the morning to steady your nerves or get rid of a hangover?   no  CAGE-AID Assessment: done        Electronically Signed by Cyndia Diver MD on 06/26/2016 at 8:46 AM  ________________________________________________________________________

## 2016-09-10 NOTE — Progress Notes (Addendum)
PCP: Cyndia Diver, MD     Chief Complaint: Rt knee pain  Responsible Provider: Cyndia Diver MD    History of Present Illness: Pt name and DOB verified.  Patient verified last name, DOB and reason for appointment.             Meds at the time of the visit  ADULT ASPIRIN LOW STRENGTH 81 MG ORAL TABLET DISINTEGRATING (ASPIRIN) Take one pill by mouth daily  METOPROLOL SUCCINATE ER 50 MG ORAL TABLET EXTENDED RELEASE 2 (METOPROLOL SUCCINATE) Take one pill by mouth daily  LIPITOR 20 MG ORAL TABLET (ATORVASTATIN CALCIUM) Take one pill by mouth daily  PENICILLIN V POTASSIUM 500 MG ORAL TABLET (PENICILLIN V POTASSIUM) Take 1 tab by mouth three times daily for 7 days  .............................. by Belenda Cruise at July 02, 2016 9:51 AM      Allergies at the time of visit (including reactions):  TRAMADOL HCL -  (Severe)hives  TORADOL -  (Severe)hives  MORPHINE -  (Severe)hives  MOTRIN -  (Severe)hives  CODEINE -  (Severe)hives  .............................. by Belenda Cruise at July 02, 2016 9:51 AM      Past Medical History:     Reviewed history from 06/26/2016 and no changes required:        CABG x 1, 2007, due to congenital heart disease--patient's explanation is that right coronary artery was never in the proper location (perfused left side of heart only) and he had repeat cabage surgery in 2012 to correct this.        CAD, MI x 3        Chronic Dental pain - multiple teeth removed        Chronic knee pain     Past Surgical History:     Reviewed history from 01/18/2014 and no changes required:        2 bypass surgeries        Chelecystectomy 08/2010        Tooth extraction 11/30/13    Family History Summary:      Reviewed history Last on 06/26/2016 and no changes required:07/05/2016  Father Dustin Carney.) - Has Family History of Diabetes - Entered On: 09/25/2013  Mother Dustin Carney.) - Has Family History of Heart Disease - Entered On: 09/25/2013  Brother (full) - Has History of Diabetes - Entered On: 06/26/2016   Brother (full) - Has History of Heart Disease - Entered On: 06/26/2016    General Comments - FH:  Mother:  Heart disease, brain cancer   Father: DM   6 siblings - 1 with DM II, 1 with heart disease  Paternal Grandmother:  Heart disease   Paternal Grandfather:  Heart disease  Maternal Gramdmother: Dementia, heart disease   Mathernal Grandfather: Heart disease       Social History:     Reviewed history from 06/26/2016 and no changes required:        He is from [place] but moved to [place] at age 89.  Has been between [place] and [address]        Lives with wife, married since 2016         No children        Disability secondary to heart disease and syncopal episode        About 40-50 pack years, used to smoke 2 PPD but now down to 2 cigarettes a day        No EtOH        No drug use  Review of Systems     General       Denies fever, chills, sweats, anorexia, fatigue, weakness, malaise, weight loss and sleep disorder.    Eyes       Denies vision loss - 1 eye, double vision, eye irritation, vision loss - both eyes, blurring, eye pain, halos, discharge and light sensitivity.    ENT       Denies ringing in the ears, ear discharge, earache, decreased hearing, nasal congestion, nosebleeds, difficulty swallowing, hoarseness, sore throat and sinus congestion.    CV       Denies difficulty breathing at night, near fainting, chest pain or discomfort, racing/skipping heart beats, fatigue, lightheadedness, shortness of breath with exertion, palpitations, swelling of hands or feet, difficulty breathing while lying down, fainting, leg cramps with exertion, bluish discoloration of lips or nails and weight gain.    Resp       Denies cough, shortness of breath, coughing up blood, chest discomfort, wheezing, excessive sputum, excessive snoring, breathing disturbances during sleep, gasping and choking during sleep, observed apnea during sleep, excessive daytime sleepiness and dyspnea on exertion.    GI        Denies excessive appetite, loss of appetite, indigestion, vomiting blood, nausea, vomiting, yellowish skin color, gas, abdominal pain, abdominal bloating, hemorrhoids, diarrhea, change in bowel habits, constipation, dark tarry stools and bloody stools.    GU       Denies dysuria, hematuria, discharge, urinary frequency, urinary hesitancy, nocturia, incontinence, genital sores, decreased libido and erectile dysfunction.    MS       See HPI    Derm       Denies excessive perspiration, night sweats, suspicious lesions, changes in nail beds, dryness, poor wound healing, unusual hair distribution, skin cancer, itching, changes in color of skin, flushing and rash.    Neuro       Denies difficulty with concentration, poor balance, headaches, disturbances in coordination, numbness, inability to speak, falling down, tingling, brief paralysis, visual disturbances, seizures, weakness, sensation of room spinning, tremors, fainting, excessive daytime sleepiness and memory loss.    Psych       Denies sense of great danger, anxiety, thoughts of suicide, mental problems, depression, feeling down, feeling hopeless, little interest, little pleasure, insomnia, thoughts of violence and frightening visions or sounds.    Endo       Denies excessive hunger, cold intolerance, heat intolerance, excessive urination, excessive thirst and weight change.    Heme       Denies enlarged lymph nodes, bleeding, skin discoloration, abnormal bruising and fevers.    Allergy       Denies persistent infections, hives or rash, seasonal allergies and HIV exposure.      Vital Signs   Weight: 282 pounds Change since last visit: 1.60 pounds   BMI: 37.20  Pulse rate: 88  Pulse rhythm: regular  Respirations: 18  O2 sat at rest (room air): 97%  Blood Pressure: 134/78 mm Hg  Cuff Size: adult    Nursing Documentation   Vitals performed and patient identified by full name and DOB: Dustin Piccolo MA July 02, 2016 9:45 AM   Medications reviewed: Dustin Piccolo MA July 02, 2016 9:45 AM  Allergies reviewed: Dustin Piccolo MA July 02, 2016 9:45 AM  Visual Analogue Pain Scale: 7      Physical Exam    General:      well developed, well nourished, in no acute distress. African American   Head:  normocephalic and atraumatic.    Eyes:      PERRL/EOM intact, conjunctiva and sclera clear with out nystagmus.  wears glasses  Msk:      no deformity or scoliosis noted of thoracic or lumbar spine.    Pulses:      pulses normal in all 4 extremities.    Extremities:      no clubbing, cyanosis, edema, or deformity noted with normal full range of motion of all joints.    Neurologic:      no focal deficits, cranial nerves II-XII grossly intact with normal sensation, reflexes, coordination, muscle strength and tone.    Skin:      intact without lesions or rashes.    Psych:      alert and cooperative; normal mood and affect; normal attention span and concentration.      Diabetes Management Exam:      Foot Exam (with socks and/or shoes not present):        Pulses:           pulses normal in all 4 extremities.        Blood Pressure:  Today's BP: 134/78 mm Hg    Labwork:   Most Recent Lab Results:   LDL: 148 mg/dL 34/74/2595                Orders:  Added new Referral order of Physical Therapy - [facility] (*) - Signed  [place] [id number]     New Patient Office or Other OP Visit - 45 min [phone]) - Signed        Dustin Carney is a 41 year old gentleman.  He was seen and assessed today with regard to his chronic right knee pain.  He reported that he had this pain for about 2 years.  His pain is global and episodic.  He described his pain as dull, aching, and sometimes sharp pain all around his right knee.  His pain level has gotten progressively worse over time and today his pain level is 8/10.  His pain is aggravated by physical activities.  He stated that his pain is associated with a lot of swelling and stiffness,  especially when he wakes up in the morning.  His pain is also associated with popping and locking episodes, as well as giving way symptoms.  He tried Tylenol in the past, which did not help with his pain.  He stated that he is allergic to Motrin, but he tried Aleve, which has given him a little bit of relief.  He stated that the only medication that helped to relieve his pain is hydrocodone with Tylenol.  He also had a previous history of bilateral knee corticosteroid injections, which did not help with his pain.  Currently, he feels that he is quite symptomatic and his pain is significant and caused him a great deal of impairment.  Nylan is a smoker.  He stated that he smokes about half a pack a day and drinks alcohol very rarely.  Currently, he is unemployed.  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  He also has a history of hypercholesterolemia.    REVIEW OF SYSTEMS:  His systemic review was unremarkable.    MEDICATIONS:  I have reviewed his list of medications.    ALLERGIES:  He is allergic to Motrin, which caused him generalized body hives.    PHYSICAL EXAMINATION:  I have noticed that he is tall and  overweight.  His weight is 280 pounds.  He is walking with an antalgic gait.  Examination of his right knee reveals no effusion.  He is having global tenderness all around his right knee.  Right knee range of motion is from 0-100 degrees, after which he stated that he is having a great deal of pain.  His right knee is stable to varus and valgus stresses, and has a negative anterior and posterior drawer test, as well as a negative Lachman test.  Neurovascular examination of his right lower extremity is unremarkable.  His right knee alignment is normal.     Examination of the left knee reveals no localized tenderness and his left knee range of motion is from 0-130 degrees.  Again, his left knee is stable to varus and valgus stresses, as well as has a negative anterior  and posterior drawer test and a negative Lachman test.  Left knee alignment is normal and has no deformity.  Neurovascular examination of the left lower extremity is unremarkable.  Bilateral hip examination reveals full range of motion.    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    ASSESSMENT/PLAN:  In summary, this pleasant, 41 year old gentleman was seen and assessed with regard to his right knee chronic knee pain.  I have told him that today I have no full explanation for his right knee pain.  His pain could be due to mild degenerative osteoarthritis or possibly an inflammatory arthritis.  Based on the fact that he has some mechanical symptoms, I do think that he might have a medial lateral menisci tear.  I do not think that he has any ligament instability.  At this point of time, my recommendation is to treat him symptomatically.  I have encouraged him to lose weight as well as to continue taking the Aleve for pain control.  I have told him today that I could not refill his prescription of hydrocodone.  I have also referred him to physical therapy to work on range of motion and strengthening of his quadriceps mechanism, as well as range of motion exercise to his right knee.  I have scheduled him to return for followup in about 6-8 weeks' time.  If he remains symptomatic, an MRI examination of the right knee will be indicated to rule out any intra-articular derangement.    Dustin Carney Nicolasa Ducking, MD      Electronically Signed by Doralee Albino MD on 07/05/2016 at 11:53 AM  ________________________________________________________________________

## 2016-09-12 LAB — BMP (EXT)
Anion Gap (EXT): 7 (ref 5–10)
BUN (EXT): 15 mg/dL (ref 6–20)
CO2 (EXT): 27 mmol/L (ref 21–31)
CalciumCalcium (EXT): 9.6 mg/dL (ref 8.6–10.3)
Chloride (EXT): 102 mmol/L (ref 98–107)
Creatinine (EXT): 0.9 mg/dL (ref 0.90–1.30)
Glucose (EXT): 98 mg/dL (ref 70–99)
Potassium (EXT): 4.4 mmol/L (ref 3.5–5.1)
Sodium (EXT): 136 mmol/L (ref 136–145)

## 2016-09-12 LAB — LIPID PROFILE (EXT)
Cholesterol (EXT): 167 mg/dL
HDL Cholesterol (EXT): 29 mg/dL
LDL Cholesterol, CALC (EXT): 117 mg/dL
Triglycerides (EXT): 105 mg/dL (ref <=155)

## 2016-09-13 LAB — BMP (EXT)
Anion Gap (EXT): 8 (ref 5–10)
BUN (EXT): 17 mg/dL (ref 6–20)
CO2 (EXT): 24 mmol/L (ref 21–31)
CalciumCalcium (EXT): 9.2 mg/dL (ref 8.6–10.3)
Chloride (EXT): 105 mmol/L (ref 98–107)
Creatinine (EXT): 0.89 mg/dL — ABNORMAL LOW (ref 0.90–1.30)
Glucose (EXT): 138 mg/dL — ABNORMAL HIGH (ref 70–99)
Potassium (EXT): 4.5 mmol/L (ref 3.5–5.1)
Sodium (EXT): 137 mmol/L (ref 136–145)

## 2016-09-13 LAB — HEMOGLOBIN A1C: HEMOGLOBIN A1C % (INT/EXT): 6.4 % — ABNORMAL HIGH (ref 4.2–5.8)

## 2016-09-22 LAB — METABOLIC PANEL, BASIC
BUN: 14 mg/dL (ref 7–22)
CO2: 26 mmol/L (ref 21–32)
Calcium: 9 mg/dL (ref 8.5–10.1)
Chloride: 111 mmol/L — ABNORMAL HIGH (ref 98–108)
Creatinine: 1.21 mg/dL (ref 0.70–1.30)
GFR est non-AA: >60 ** (ref >60)
Glucose: 112 mg/dL — ABNORMAL HIGH (ref 74–106)
Potassium: 4.6 mmol/L (ref 3.4–5.1)
Sodium: 144 mmol/l (ref 136–145)

## 2016-09-22 LAB — CBC WITH AUTOMATED DIFF
ABS. BASOPHILS: 0 10*3/uL — ABNORMAL LOW (ref 0.01–0.08)
ABS. EOSINOPHILS: 0.07 10*3/uL (ref 0.04–0.54)
ABS. IMM. GRANS.: 0.01 10*3/uL (ref 0.003–0.041)
ABS. LYMPHOCYTES: 1.58 10*3/uL (ref 1.32–3.57)
ABS. MONOCYTES: 0.2 10*3/uL — ABNORMAL LOW (ref 0.30–0.82)
ABS. NEUTROPHILS: 2.41 10*3/uL (ref 1.78–5.38)
BASOPHILS: 0 %
EOSINOPHILS: 1.6 %
HCT: 36.8 % — ABNORMAL LOW (ref 42.0–52.0)
HGB: 12.2 g/dL — ABNORMAL LOW (ref 14.0–18.0)
IMMATURE GRANULOCYTES: 0.2 % (ref 0.00–0.60)
LYMPHOCYTES: 37 %
MCH: 31.2 pg — ABNORMAL HIGH (ref 27.0–31.0)
MCHC: 33.2 % (ref 33.0–37.0)
MCV: 94.1 fL — ABNORMAL HIGH (ref 80.0–94.0)
MONOCYTES: 4.7 %
NEUTROPHILS: 56.5 %
PLATELET: 79 10*3/uL — ABNORMAL LOW (ref 130–400)
RBC: 3.91 10*6/uL — ABNORMAL LOW (ref 4.70–6.10)
RDW: 13.4 % (ref 11.5–14.5)
WBC: 4.3 10*3/uL — ABNORMAL LOW (ref 4.5–10.9)

## 2016-09-22 LAB — MAGNESIUM: Magnesium: 1.8 mg/dL (ref 1.6–2.6)

## 2016-09-22 LAB — TROPONIN I
Troponin-I: <0.015 ng/mL
Troponin-I: <0.015 ng/mL

## 2016-09-22 LAB — AMB EXT HCT: Hct, External: 36.8

## 2016-09-22 NOTE — ED Provider Notes (Signed)
[facility]    [place]                         REPORT TYPE:   Emergency Dept Provider Report             REPORT STATUS: Signed    ________________________________________________________________________        PATIENT NAME:      Dustin Carney,Dustin Carney   PATIENT ID #:   [id number]    BIRTHDATE:           09-May-1976   ACCOUNT #:     [id number]    AGE:           41      ADMITTED:             GENDER:           M      ED PHYSICIAN: Jemarion Roycroft DO    LOC:          ED      ED ARRIVAL: 09/22/16  0518h    CHIEF COMPLAINT: Chest Pain    ________________________________________________________________________        Lysle DingwallINTERESTED PARTIES:       NURSING UNIT:  ED    PRIMARY CARE PHYSICIAN:     OTHER COPIES TO:                                                    ________________________________________________________________________            Date of Service    Date of Service:  Sep 22, 2016        Labs/Rads/ECGs    Laboratory Results         Laboratory Tests            Test 09/22/16 09/22/16         05:45 07:55         White Blood Count 4.3 10E3/mm3          Red Blood Count 3.91 10E6/mm3          Hemoglobin 12.2 g/dL          Hematocrit 09.836.8 %          Mean Corpuscular Volume 94.1 fL          Mean Corpuscular Hemoglobin 31.2 pg          Mean Corpuscular Hemoglobin 33.2 %         Concent           Red Cell Distribution Width 13.4 %          Platelet Count 79 10E3/mm3          Granulocytes % (Auto) 56.5 %          Lymphocytes (%) (Auto) 37.0 %          Monocytes (%) (Auto) 4.7 %          Eosinophils (%) (Auto) 1.6 %          Basophils (%) (Auto) 0 %          Absolute Immature Granulocyte 0.010 10E3/uL         (auto           Immature Granulocytes % 0.20 %  Absolute Granulocytes (CBC) 2.41 10E3/uL          Absolute Lymphocytes (CBC) 1.58 10e3/uL          Absolute Monocytes (CBC) 0.20 10E3/ul          Absolute Eosinophils (CBC) 0.07 10E3/uL          Absolute Basophils (CBC) 0.00 10e3/uL           Sodium Level 144 mmol/Carney          Potassium Level 4.6 mmol/Carney          Chloride Level 111 mmol/Carney          Carbon Dioxide Level 26 mmol/Carney          Blood Urea Nitrogen 14 mg/dL          Creatinine 9.14 mg/dL          Glomerular Filtration Rate >60 **         Calc           Glucose Level 112 mg/dL          Calcium Level 9.0 mg/dL          Magnesium Level 1.8 mg/dL          Troponin I  <7.829 ng/mL                Course    Records Reviewed:  old [facility] records    Medical Decision Making    Assumed care of patient by previous ED provider    Second troponin was normal. Patient has had normal catheterization and stress test in the past his   delta troponin was normal which makes acute coronary syndrome very    Unlikely.  Patient has having some acute on chronic chest wall pain which will likely need to be   addressed in a nonnarcotic fashion.  He has been recommended to follow up with his primary care   provider for this.        Discharge Plan    Clinical Impression:       Primary Impression:       Chest wall pain        Additional Instructions:    Your evaluation today but did not yield any evidence of heart attack or heart damage.    It is recommended that you follow-up with her outpatient providers regarding your chest wall pain   or issues.    Condition:  Stable            Quinterius Gaida DO Sep 22, 2016 08:34        Signatures:   <Electronically signed by Cyndia Skeeters DO> at 09/22/16 [phone]                                                                    Disclaimer: Portions of this document may contain text elements generated through computerized   voice recognition. Undetected computer generated word errors are possible. Please notify the   signing provider or return the document to [facility] Records department if clarification or   correction is needed.

## 2016-09-22 NOTE — ED Provider Notes (Signed)
[facility]    [place], [place]                         REPORT TYPE:   Emergency Dept Provider Report             REPORT STATUS: Signed    ________________________________________________________________________        PATIENT NAME:      Dustin Carney, Dustin Carney   PATIENT ID #:   [id number]    BIRTHDATE:           [birth year]   ACCOUNT #:     [id number]    AGE:           41      ADMITTED:             GENDER:           M      ED PHYSICIAN: Graylin Shiver MD    LOC:          ED      ED ARRIVAL: 09/22/16  0518h    CHIEF COMPLAINT: Chest Pain    ________________________________________________________________________        Lysle Dingwall PARTIES:       NURSING UNIT:  ED    PRIMARY CARE PHYSICIAN:     OTHER COPIES TO:                                                    ________________________________________________________________________            History of Present Illness    Date of Service:  Sep 22, 2016    Chief Complaint    Chest pain    Travelled outside the [place]:  No    Patient displaying symptoms:  No    Source:  patient    Her Dustin Carney is a 41 year old male who presents with complaints of chest pain. He states it   began approximately 1 hr ago.  He states it radiates to his arm and jaw.  When asked if this is   typical for his previous episodes of chest pain he states that he does not usually have chest pain.   He denies any other radiation of the pain. He has not found anything that makes it better or worse.    He states he has a cardiologist in [place] and they gave him nitro and when the nitro does not   work they gave him ?something else ?[name]  When asked to clarify that, patient states that the give him   morphine.  He does not recall the name of his cardiologist in [place].  He states he has not had any   swelling in his legs.  He denies any shortness of breath, dizziness, diaphoresis or nausea.  He   denies fever, chills, cough, runny nose, vomiting or diarrhea.  He states  his most recent cardiac   catheterization was ?a while ago?.  He does not know what the results were.  He states he has not   been seen at [facility] since the cardiac cath was done.  He states he no longer   has a local cardiologist because he is moved to [place] is currently here visiting family.  Patient   states that he has not taken any  narcotics in ?a long time?[name]  He thinks he may have taken some   narcotics on April 1st for an orthopedic injury. Patient states he doesn't know the results of his   cardiac catheterization.        Review of Systems    Constitutional:  see HPI    Ears, Nose, Throat, Mouth:  see HPI    Respiratory:  see HPI    Cardiovascular:  see HPI    Gastrointestinal:  see HPI    Musculoskeletal:  denies: erythema, swelling    Neurologic:  denies: dizziness    Hematologic/Lymphatic:  denies: shortness of breath    Immunologic/Allergic:  medication allergies    ALLERGIES:      Coded Allergies:           ketorolac tromethamine (Verified  Allergy, Mild, 05/03/16)     HIVES         codeine (Unverified  Allergy, Unknown, HIVES, 05/03/16)         ibuprofen (Verified  Allergy, Unknown, 05/03/16)     HIVES AND ITCHING         tramadol (Verified  Adverse Reaction, Unknown, "GAGGING", 05/03/16)    ------------------------------         HOME MEDICATIONS     Active     Reported    Metoprolol Succinate (Er) (Metoprolol Succinate) 50 Mg Tabcr 50 Mg PO DAILY    Lipitor (Atorvastatin Calcium) 20 Mg Tablet 40 Mg PO DAILY    Aspir 81 (Aspirin) 81 Mg Tablet.dr 324 Mg PO DAILY            Past Medical History    Medical History:  MI, 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)    Surgical History:  cholecystectomy, coronary bypass surgery    Psych History:  no pertinent history    Family History    Significant Family History:  heart disease        Social History    Smoking Status:  Light Tobacco Smoker    Alcohol Use:  occasionally     Drug Use:  oxycodone (Percocet), hydrocodone (Vicodin)        Physical Exam    VITAL SIGNS         Vital Signs              Date Time  Temp Pulse Resp B/P Pulse Ox O2 Delivery O2 Flow Rate FiO2         09/22/16 08:43 37.0 77 20 115/68 99 Room Air           09/22/16 05:28 37.1 95 16 128/70 96 Room Air              Nursing Documentation Vitals:  reviewed by me    *    General:  awake, alert, appropriate with exam    HEENT: PERRLA, EOMI, normal conjuctiva    Neck: supple    OP: mucosa moist, OP clear    Lungs: clear with good aeration; no wheezes, rales or ronchi    CV: RRR without murmur    Chest wall: No tenderness with palpation    Abd:  soft, NT, no HSM, no masses, normal BS    Back: No CVA tenderness and no midline tenderness    Ext: warm, dry, well perfused without edema    Skin: no acute rash    Neuro:  Grossly intact with normal speech normal gait    Pulses: 2+ = radial pulses  Labs/Rads/ECGs    Laboratory Results             Laboratory Tests            Test 09/22/16 09/22/16         05:45 07:55         White Blood Count 4.3 10E3/mm3          Red Blood Count 3.91 10E6/mm3          Hemoglobin 12.2 g/dL          Hematocrit 91.436.8 %          Mean Corpuscular Volume 94.1 fL          Mean Corpuscular Hemoglobin 31.2 pg          Mean Corpuscular Hemoglobin 33.2 %         Concent           Red Cell Distribution Width 13.4 %          Platelet Count 79 10E3/mm3          Granulocytes % (Auto) 56.5 %          Lymphocytes (%) (Auto) 37.0 %          Monocytes (%) (Auto) 4.7 %          Eosinophils (%) (Auto) 1.6 %          Basophils (%) (Auto) 0 %          Absolute Immature Granulocyte 0.010 10E3/uL         (auto           Immature Granulocytes % 0.20 %          Absolute Granulocytes (CBC) 2.41 10E3/uL          Absolute Lymphocytes (CBC) 1.58 10e3/uL          Absolute Monocytes (CBC) 0.20 10E3/ul          Absolute Eosinophils (CBC) 0.07 10E3/uL          Absolute Basophils (CBC) 0.00 10e3/uL          Sodium Level 144 mmol/l           Potassium Level 4.6 mmol/L          Chloride Level 111 mmol/L          Carbon Dioxide Level 26 mmol/L          Blood Urea Nitrogen 14 mg/dL          Creatinine 7.821.21 mg/dL          Glomerular Filtration Rate >60 **         Calc           Glucose Level 112 mg/dL          Calcium Level 9.0 mg/dL          Magnesium Level 1.8 mg/dL          Troponin I  <9.562<0.015 ng/mL                Radiology Impressions    Chest x-ray without acute abnormality    Image as visualized:  by me    ECG Impressions    Sinus rhythm 99 in no acute ischemia and unchanged from EKG done on May 19, 2015        Course    Records Reviewed:  [facility], PMP Database, Public [employer] Info [employer]    Medical Decision Making  Mr. centers is a 41 year old gentleman who presents with complaints of chest pain. I have reviewed   his records from here and from other hospital facilities.   This is a common presenting complaint   for him.  His story is concerning for coronary artery disease however he has been seen for this   frequently and has had extensive workups that have been unremarkable. He was seen at [facility] on March 22nd with complaints of chest pain.He had a negative workup at that time.    He had had a cardiac catheterization 2-3 weeks prior to that that showed clean coronary arteries.    He was seen that night at [facility] with complaints of dental pain. He was   given Vicodin at that time.  He denies any findings that are concerning for DVT to cause a PE.  He   is PERC negative. He does not have pneumonia on auscultation.  He does not have a pneumothorax..        Mr. Arrona states he has not had narcotics in a very long period of time.  When I made him aware   that there is record of him getting a prescription for narcotics in April he states that he was   given narcotics for an orthopedic injury in early April.  On review of the physician monitoring    program it is noted that he received Vicodin From a [facility] on April 21st.  He received   Vicodin from a hospital in [place], [place] on April 26 and he received oxycodone from   hospital in [place] on April 30th.  I suspect this presentation is an attempt to   received pain medications.  Knowing he has clean coronary arteries and has had multiple workups in   the past that are negative for cardiac ischemia I am less concerned about a cardiac etiology.    However, the story he presents is concerning for cardiac disease.  He will have a troponin.  His   EKG is normal. Pain started 1 hr ago so he will require a delta troponin for full evaluation.  He   has been given aspirin.  Initial laboratory evaluation is unremarkable.        Disposition is pending at change of shift.  Delta troponin is pending at change of shift.  Care   signed out to Dr. Samson Frederic.        Discharge Plan    Clinical Impression:       Primary Impression:       Chest pain    Referrals:      BUCZYNSKI,PATRICK MD (PCP)    Condition:  Pleas Koch MD Sep 22, 2016 05:54        Signatures:   <Electronically signed by Graylin Shiver MD> at 09/24/16 [phone]                                                                    Disclaimer: Portions of this document may contain text elements generated through computerized   voice recognition. Undetected computer generated word errors are possible. Please notify the  signing provider or return the document to [facility] Records department if clarification or   correction is needed.

## 2016-10-10 NOTE — Telephone Encounter (Signed)
Pt name DOB verified:    Concern/Problem: Pt is calling requesting to transfer to Sunday Spillersaniel Strauss at [facility].  Pt states he does not feel like he is getting the care he needs from Dr.Buczynski. Pt states Dr.Bucznski never wants to see pt for an appointment, and feels he had an attitude toward pt.   Please advise on transfer    Pharmacy:    Call back #: [phone] Carmelina PealKatherine R Possiel

## 2016-10-13 ENCOUNTER — Inpatient Hospital Stay
Admit: 2016-10-13 | Discharge: 2016-10-13 | Disposition: A | Discharge status: Home / Self Care | Payer: MEDICAID | Attending: Nurse Practitioner

## 2016-10-13 DIAGNOSIS — K047 Periapical abscess without sinus: Secondary | ICD-10-CM

## 2016-10-13 MED ORDER — PENICILLIN V-K 500 MG TAB
500 mg | ORAL_TABLET | Freq: Four times a day (QID) | ORAL | 0 refills | Status: AC
Start: 2016-10-13 — End: 2016-10-20

## 2016-10-13 NOTE — ED Notes (Signed)
Patient left prior to signing discharge paper work or e signature.

## 2016-10-13 NOTE — ED Provider Notes (Signed)
HPI Comments: 41 year old male with a history of poor dentition was 1 single tooth remaining upper jaw.  Over last several days it tooth his become painful.  It is constantly aching and throbbing.  It reminds him of previous dental infections.  He would like some antibiotics.  He has had no fall trauma or injury. He sees a Education officer, communitydentist on Tuesday to have it extracted.  He denies any associated fever, chills, sweats, facial swelling, difficulty swallowing or breathing.    Patient is a 41 y.o. male presenting with dental problem. The history is provided by the patient.   Dental Pain             Past Medical History:   Diagnosis Date   ??? CAD (coronary artery disease)    ??? Cardiac revascularization with aortocoronary bypass anastomosis    ??? Hypertension    ??? Myocardial infarct (HCC)        No past surgical history on file.      No family history on file.    Social History     Social History   ??? Marital status: MARRIED     Spouse name: N/A   ??? Number of children: N/A   ??? Years of education: N/A     Occupational History   ??? Not on file.     Social History Main Topics   ??? Smoking status: Current Some Day Smoker   ??? Smokeless tobacco: Former NeurosurgeonUser   ??? Alcohol use No   ??? Drug use: No   ??? Sexual activity: Not on file     Other Topics Concern   ??? Not on file     Social History Narrative         ALLERGIES: Codeine; Motrin [ibuprofen]; Morphine; Toradol [ketorolac]; and Tramadol    Review of Systems   Constitutional: Negative for chills, diaphoresis, fatigue and fever.   HENT: Positive for dental problem. Negative for congestion, ear discharge, ear pain, facial swelling, postnasal drip, rhinorrhea, sinus pain, sinus pressure, sore throat and trouble swallowing.    Eyes: Negative for photophobia, pain, discharge, redness and itching.   Respiratory: Negative for cough, shortness of breath and wheezing.    Skin: Negative for rash.       Vitals:    10/13/16 1643   BP: 129/77   Pulse: 77   Resp: 16   Temp: 97.9 ??F (36.6 ??C)   SpO2: 98%             Physical Exam   Constitutional: He appears well-developed and well-nourished. No distress.   HENT:   Head: Normocephalic and atraumatic.   Right Ear: External ear normal.   Left Ear: External ear normal.   Nose: Nose normal.   Mouth/Throat: Oropharynx is clear and moist. Oral lesions:  or erythema appreciated.   The dentition is poor throughout.  There are multiple dental caries.  There is a single tooth in the right upper posterior jaw which is tender percussion.  There is no fluctuant mass or pustule.  Ginger is diffusely erythematous in that region.  Sublingual area is soft and supple.  Posterior oropharynx is normal. No facial edema   Eyes: Conjunctivae are normal. Pupils are equal, round, and reactive to light. Right eye exhibits no discharge. Left eye exhibits no discharge. No scleral icterus.   Neck: Normal range of motion. Neck supple. No tracheal deviation present. No thyromegaly present.   Cardiovascular: Normal rate and regular rhythm.    Pulmonary/Chest: Breath sounds normal.  No stridor.   Lymphadenopathy:     He has no cervical adenopathy.   Skin: Skin is warm and dry.        MDM  Number of Diagnoses or Management Options  Dental infection:         ED Course       Procedures

## 2016-10-13 NOTE — ED Triage Notes (Signed)
Patient presents with two weeks of upper right dental pain, has an appointment to get tooth pulled on Tuesday May [facility]th [facility], states he cant wait cause pain is too bad. Took tylenol with no relief, allergic to motrin.

## 2016-10-16 NOTE — Telephone Encounter (Signed)
I called patient back at [phone] and [phone]; both numbers are no longer in service.  I reviewed his chart and do not see where he has requested to be seen by Dr. B and was refused.  He has only seen Dr. B 2 times in 2018.  He was previously a patient of [name] at [facility] and transferred out on 03/17/14 because he was not satisfied with his provider.  I do see that he requested Dr. B order an MRI; however, this patient has no insurance and was advised that this would be very expensive for him and he would need to speak with Patient Rep to work on a payment arrangement first.  He was also advised to check with Ortho at his next visit to see if they felt an MRI was needed.  I do not believe transfer to another provider within the system is appropriate at this time.  As, both of his numbers are not in service I can not attempt service recovery; however, If he calls back I will speak with him to discuss the above.

## 2016-10-23 NOTE — Telephone Encounter (Signed)
Pt name and DOB verified.  Pt's fiancee Desma Maximntonia is calling requesting to transfer [facility] the [facility] to see Sunday Spillersaniel Strauss.  Desma Maximntonia also provided a [facility]# [id number] which I have added to the system.    CB# [phone]. Ovidio KinKrista M Laverdiere

## 2016-10-25 ENCOUNTER — Inpatient Hospital Stay
Admit: 2016-10-25 | Discharge: 2016-10-25 | Disposition: A | Discharge status: Home / Self Care | Payer: MEDICAID | Attending: Medical

## 2016-10-25 DIAGNOSIS — K047 Periapical abscess without sinus: Secondary | ICD-10-CM

## 2016-10-25 NOTE — Other (Signed)
HPI Comments: 41 year old male patient presents with dental pain after being on penicillin for 3 days preparing for a Monday appointment [facility] to get some teeth removed from his upper jaw.  He only has 1 tooth left his upper jaw.  He complains of pain when he tries to drink or eat anything at this time no complaints of any drainage in his mouth currently.  He is mostly focused on the discomfort and pain that he is feeling.  He has significant allergies to morphine codeine Motrin and Toradol tramadol.  Says he breaks out into a rash with these.   History of coronary artery disease and revascularization from a myocardial infarction.      Patient is a 41 y.o. male presenting with dental problem. The history is provided by the patient.   Dental Pain             Past Medical History:   Diagnosis Date   ??? CAD (coronary artery disease)    ??? Cardiac revascularization with aortocoronary bypass anastomosis    ??? Hypertension    ??? Myocardial infarct (HCC)         No past surgical history on file.      No family history on file.     Social History     Social History   ??? Marital status: MARRIED     Spouse name: N/A   ??? Number of children: N/A   ??? Years of education: N/A     Occupational History   ??? Not on file.     Social History Main Topics   ??? Smoking status: Current Some Day Smoker   ??? Smokeless tobacco: Former NeurosurgeonUser   ??? Alcohol use No   ??? Drug use: No   ??? Sexual activity: Not on file     Other Topics Concern   ??? Not on file     Social History Narrative                ALLERGIES: Morphine; Codeine; Motrin [ibuprofen]; Toradol [ketorolac]; and Tramadol    Review of Systems   HENT: Positive for dental problem.        There were no vitals filed for this visit.    Physical Exam   Constitutional: He appears well-developed and well-nourished.   HENT:   Head: Normocephalic and atraumatic.   Poor dental hygiene but I do not see any erythematous areas or swollen  areas suggestive of a dental abscess that is currently active or any current area of drainage.   Because he significant areas of dental caries and suspect that he probably does have some underlying chronic dental infections that are uncomfortable.    Pulmonary/Chest: Effort normal and breath sounds normal.       MDM     Differential Diagnosis; Clinical Impression; Plan:     Patient is already on penicillin for current dental infection artery has dental [facility] scheduled [facility] but his major concern seems to be more pain control which she has multiple allergies to medications a Motrin, tramadol, ketorolac which we could use in this situation for pain control.  At this point I recommend that the patient needs any scheduled to current narcotic pain medication to be seen in the emergency department.          ED Course       Procedures

## 2016-10-26 NOTE — Telephone Encounter (Signed)
After looking into this further this is a patient of Dr Nash DimmerBuczynski. Pt had requested transfer but was declined. Reynaldo Miniumheresa A Chandlar Guice, RN 10/26/2016 at 1:29 PM

## 2016-10-26 NOTE — Telephone Encounter (Signed)
unfortunately we cannot take pt as Dr Gardiner CoinsMichaud is not excepting new patients. This has to go through the front office supervisor who is not in today.

## 2016-10-26 NOTE — Telephone Encounter (Signed)
Pt name & DOB verified.  When did you go to the ER?   Emergency room [facility]  What were you seen for? Dental Issues  How are you feeling today? Pt still in pain  Booked appointment on  at  with .    Pt is looking to be seen today if possible. Pt stated the [facility] would not do anything for him and told him to follow up with his PCP instead. Pt stated the dentist can't get him in until the end of the month, and the Pt cannot wait that long.    CB#: [phone] Gwendolyn GrantHeather D Wertz

## 2016-10-26 NOTE — Telephone Encounter (Signed)
Left message for patient to call back.

## 2016-10-26 NOTE — Telephone Encounter (Signed)
Went to the emergency room for dental pain and they would not give him any medications. His dental appointment is on Monday. I advised patient that we do not have any openings in the office today to re-evaluate this and his only other option would be to return to the emergency room. He expresses understanding.

## 2016-10-26 NOTE — Telephone Encounter (Signed)
Wrong PCP listed.  PCP updated from [facility]. Forward this note to [facility].    Reynaldo Miniumheresa A Sadeel Fiddler, RN 10/26/2016 at 12:09 PM

## 2016-10-26 NOTE — Telephone Encounter (Signed)
Pt name DOB verified:    Concern/Problem: Pt returning call. Pt would like a call back as soon as possible.     Call back #: [phone]  Dustin GrantHeather D Wertz

## 2016-11-15 ENCOUNTER — Ambulatory Visit: Admit: 2016-11-15 | Discharge: 2016-11-15 | Payer: MEDICAID | Attending: Adult Health | Primary: Family Medicine

## 2016-11-15 DIAGNOSIS — K0889 Other specified disorders of teeth and supporting structures: Secondary | ICD-10-CM

## 2016-11-15 NOTE — Progress Notes (Signed)
Progress Note      NAME: Dustin Carney   DOB:  11-Jul-1975  MRM:  [id number]    Date/Time: 11/15/2016  2:30 PM         Subjective:   Pt is new to me. PMH, PSH, ALL reviewed. Pertinent information:  - Congenital CAD w/hx 2 bypasses, HTN, smoker, poor dentition, chronic dental pain, MDD    Had 3 teeth pulled 3w ago in [facility]  Sees Dr Kippax 11/20/16 for additional extractions  Notes ongoing pain in R lower jaw   No fevers. Feels like R face is a bit swollen  He went to [facility] a few weeks ago and they gave an extended course of PCN which he is still taking  Denies missed PCN doses  For pain, has trialed apap, magic mouthwash (had leftover at home from another provider), and PCN  Nothing is helping  He just wants something to help w/the pain even if only one night of medication  ALL list indicates hives w/motrin, toradol, coedine. Anaphylaxis w/tramadol  I reviewed old ortho notes  He told that MD he was taking Aleve w/o probs; pt states he has never taken Aleve  When I offer gabapentin and viscous lidocaine, tells me gabapentin no help in past, lidocaine gives him a rash   (using Magic Mouthwash as above)  He is tremulous during the exam nd tells me this is d/t difficult experiences w/prior doctors office visits     ROS:   Pertinent positives in HPI. Remainder of ROS negative or non-contributory.    There is no problem list on file for this patient.      Vitals:     Visit Vitals   ??? BP (P) 132/84 (BP 1 Location: Right arm, BP Patient Position: Sitting)   ??? Pulse (P) 82   ??? Temp (P) 98.4 ??F (36.9 ??C) (Oral)   ??? Resp (P) 16   ??? Wt (P) 282 lb (127.9 kg)   ??? SpO2 (P) 98%   ??? BMI (P) 35.25 kg/m2     SpO2 Readings from Last 6 Encounters:   11/15/16 (P) 98%   10/25/16 97%   10/13/16 98%   08/20/09 97%              Exam:     Physical Exam   Constitutional: Vital signs are normal. He appears well-developed.  Non-toxic appearance. He does not appear ill.   HENT:   Head: Normocephalic and atraumatic.    Mouth/Throat: Uvula is midline, oropharynx is clear and moist and mucous membranes are normal.   No facial edema noted. Poor dentition. Those teeth that remain have significant caries. No edema or erythema of gums/mucosa.    Psychiatric:   Visibly upset when I decline to provide narcotics RX. Tremulous during exam only       Medications:  Current Outpatient Prescriptions   Medication Sig Dispense   ??? atorvastatin (LIPITOR) 20 mg tablet Take 20 mg by mouth daily.    ??? aspirin 81 mg chewable tablet Take 324 mg by mouth daily.    ??? metoprolol (LOPRESSOR) 25 mg tablet Take 25 mg by mouth daily.      No current facility-administered medications for this visit.        ______________________________________________________________________    Allergies   Allergen Reactions   ??? Morphine Anaphylaxis   ??? Codeine Hives   ??? Motrin [Ibuprofen] Hives   ??? Toradol [Ketorolac] Hives   ??? Tramadol Anaphylaxis  Assessment & Plan:     Diagnoses and all orders for this visit:    1. Pain, dental  Ongoing  At this time, infection appears adequately TXd; [facility]  Offered gabapentin and viscous lidocaine RX  Pt declined both  Will CC note to PCP for awareness    Attending NP: Lenon AhmadiSarah K Donnivan Villena, NP

## 2016-11-15 NOTE — Telephone Encounter (Signed)
Pt name and DOB verified.    Pt called regarding infected tooth and face swelling. Has an appointment with the dentist but can not go until Monday. Requesting to be seen today or tomorrow. Willing to see any provider.    [phone] (home)   Dustin GladeMarina Morris  [phone]

## 2016-11-15 NOTE — Telephone Encounter (Signed)
Spoke with pt, he has dental appt next Tuesday with Dr. Bonney AidKippax but currently has an infection around this tooth and was told to contact PCP.   States he is currently on antibiotics given at [facility] but has been several days and feels as though he needs something different because it is not helping his swelling, pain.   He also asked about pain medication. I asked what he is currently taking, he states tylenol 500mg  every 4 hours. States he can't take ibuprofen because he has an allergy to it. He is also using magic mouthwash.   He requests appt today and I did first recommend UC but he declines and requests appt in the office- I did schedule him with [facility] but I did tell him he would NOT likely be prescribed narcotics for pain but if she feels he needs a different antibiotic that is something she may do and that she could also give him alternative options for pain control as well.

## 2016-11-26 ENCOUNTER — Ambulatory Visit: Admit: 2016-11-26 | Discharge: 2016-11-26 | Payer: MEDICAID | Attending: Internal Medicine | Primary: Family Medicine

## 2016-11-26 ENCOUNTER — Inpatient Hospital Stay
Admit: 2016-11-26 | Discharge: 2016-11-26 | Discharge status: Against Medical Advice | Payer: MEDICAID | Attending: Emergency Medicine

## 2016-11-26 DIAGNOSIS — K029 Dental caries, unspecified: Secondary | ICD-10-CM

## 2016-11-26 DIAGNOSIS — K0889 Other specified disorders of teeth and supporting structures: Secondary | ICD-10-CM

## 2016-11-26 MED ORDER — ACETAMINOPHEN 325 MG TABLET
325 mg | ORAL | Status: DC
Start: 2016-11-26 — End: 2016-11-26

## 2016-11-26 MED ORDER — PENICILLIN V-K 250 MG TAB
250 mg | Freq: Once | ORAL | Status: DC
Start: 2016-11-26 — End: 2016-11-26

## 2016-11-26 MED ORDER — OXYCODONE-ACETAMINOPHEN 5 MG-325 MG TAB
5-325 mg | ORAL_TABLET | ORAL | 0 refills | Status: DC
Start: 2016-11-26 — End: 2016-11-29

## 2016-11-26 NOTE — Progress Notes (Signed)
[facility] [address]   [address]  [place] [id number]  [phone]    CHIEF COMPLAINT     Dustin BrackettMelvin L Carney is a 41 y.o. male who presents to the office today for Dental Pain.    HPI     Pt is 41 year old M who presents for continued dental pain.  Patient has longstanding total dental pain and has history of multiple extractions.  Over last few months, has had severe pain in upper right part of mouth that has been worsening.  Has been on and off penicillin and currently pain has improved somewhat but has continued to be bothersome.  He was supposed to have this done a few months ago but has lost medicaid and only now getting it back      MEDICATIONS     Current Outpatient Prescriptions   Medication Sig   ??? atorvastatin (LIPITOR) 20 mg tablet Take 20 mg by mouth daily.   ??? aspirin 81 mg chewable tablet Take 324 mg by mouth daily.   ??? metoprolol (LOPRESSOR) 25 mg tablet Take 25 mg by mouth daily.     No current facility-administered medications for this visit.        ALLERGIES     Allergies   Allergen Reactions   ??? Morphine Anaphylaxis   ??? Codeine Hives   ??? Motrin [Ibuprofen] Hives   ??? Toradol [Ketorolac] Hives   ??? Tramadol Anaphylaxis       REVIEW OF SYSTEMS   See HPI    ACTIVE MEDICAL PROBLEMS     Patient Active Problem List   Diagnosis Code   ??? Dental caries K02.9   ??? Hyperlipidemia E78.5   ??? Atherosclerosis of coronary artery I25.10   ??? Status post cholecystectomy Z90.49   ??? Knee pain M25.569   ??? Major depressive disorder, recurrent episode, moderate (HCC) F33.1   ??? Obesity E66.9   ??? 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 Prediabetes R73.03   ??? Encounter for general adult medical examination without abnormal findings Z00.00   ??? Tobacco dependence syndrome F17.200   ??? 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    PHYSICAL EXAM   MOUTH: Very poor dentition, several missing tooth, severe gingivitis    ORDERS & MEDS (NEW)     Orders Placed This Encounter   ??? METABOLIC PANEL, COMPREHENSIVE   ??? LIPID PANEL W/ REFLX DIRECT LDL   ??? HEMOGLOBIN A1C WITH EAG   ??? MICROALBUMIN, UR, RAND   ??? Referral to [facility]     There are no discontinued medications.    ASSESSMENT AND PLAN     1) DENTAL CARIES/TOOTH PAIN  Patient presents with persistent tooth pain over the last few months but because of lack of medicaid for a few months was not able to take care of it.  Now that he has it he would like a referral to [facility] for tooth extraction.  Will give him enough percocet to last him until that appointment as based on physicial it  does appear quite painful.      RTC in 2 months for f/u    Cyndia Diver, MD      Follow-up Disposition: Not on File    Future Appointments  Date Time Provider Department Center   07/01/2017 9:20 AM Cyndia Diver, MD [facility]       Cyndia Diver, MD  11/26/2016

## 2016-11-26 NOTE — ED Triage Notes (Signed)
Dental surgery two weeks ago. Dental pain on the left and right upper side started this morning, woke him from his sleep. States he "thinks I have a dry socket."

## 2016-11-26 NOTE — Telephone Encounter (Signed)
Wrong office. Will route to correct one.Dustin Parrhristine R Jahmire Ruffins, Dustin Carney

## 2016-11-26 NOTE — ED Notes (Signed)
Patient left without discharge orders.

## 2016-11-26 NOTE — Telephone Encounter (Signed)
Pt name & DOB verified.  When did this start? 2 days ago  Can you describe your tooth issue? Pain in tooth  Are you having any tooth pain? yes  Do you have a dentist? Yes, needs to come in for referral  Anything else you would like the provider to know? no  Booked on 7-9 at 11:20 with Cyndia DiverPatrick Buczynski. (Per ES)     CB# [phone] Senaida OresKrista M Laverdiere

## 2016-11-26 NOTE — Telephone Encounter (Signed)
I did call the pharmacy and they did dispense the percocet, I did let the provider know about this.

## 2016-11-26 NOTE — Progress Notes (Signed)
Letter returned, no such address.

## 2016-11-26 NOTE — Telephone Encounter (Signed)
Pt name and DOB verified.    Josh from 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

## 2016-11-26 NOTE — ED Provider Notes (Signed)
HPI   Mr. Dustin Carney is a 41 year old gentleman who presents with complaints of dental pain. He tells he had tooth fragments pulled 2 weeks ago to the left upper jaw.  He also has a carious tooth to the right upper jaw.  He is complaining of pain.  He initially stated that he woke up with new onset pain today.  He then tells me that he has been having pain since the procedure was done 2 weeks ago.  [place] in [place].  He does not have any swelling. He does not have any drainage in his mouth.  He does not have any fever.  He is requesting pain medicine.  Past Medical History:   Diagnosis Date   ??? CAD (coronary artery disease)    ??? Cardiac revascularization with aortocoronary bypass anastomosis    ??? Hypertension    ??? Myocardial infarct [facility](HCC)        Past Surgical History:   Procedure Laterality Date   ??? HX CHOLECYSTECTOMY           History reviewed. No pertinent family history.    Social History     Social History   ??? Marital status: SINGLE     Spouse name: N/A   ??? Number of children: N/A   ??? Years of education: N/A     Occupational History   ??? Not on file.     Social History Main Topics   ??? Smoking status: Current Some Day Smoker   ??? Smokeless tobacco: Former NeurosurgeonUser   ??? Alcohol use No   ??? Drug use: No   ??? Sexual activity: Not on file     Other Topics Concern   ??? Not on file     Social History Narrative         ALLERGIES: Morphine; Codeine; Motrin [ibuprofen]; Toradol [ketorolac]; and Tramadol    Review of Systems see HPI    Vitals:    11/26/16 0300   BP: 137/84   Pulse: 73   Resp: 17   Temp: 97.5 ??F (36.4 ??C)   SpO2: 97%   Weight: 124.7 kg (275 lb)   Height: 6\' 4"  (1.93 m)            Physical Exam   General:  awake, alert sitting up on the bed appearing comfortable  HEENT: PERRLA, EOMI, normal conjuctiva  Neck: supple  OP: mucosa moist, OP clear; carious right upper molar; extraction site left lateral incisor upper  Lungs: no respiratory distress  Ext: warm, dry, well perfused without edema   Skin: no acute rash  Neuro: Grossly intact with normal gait & normal speech  Capillary refill less than 3 seconds        MDM  Mr. Dustin Carney is a 41 year old gentleman who presents with complaints of dental pain. He has well-healing extraction site to the left upper maxilla.  He has is severely carious tooth to the right upper maxilla.  With a carious tooth I did opt to start him on antibiotics.  He has requested pain medication I have ordered Tylenol.  He has decided to leave before getting penicillin, Tylenol or his prescriptions.  I did discuss with him the importance of following up with his dentist prior to his departure.    ED Course       Procedures

## 2016-11-28 ENCOUNTER — Telehealth

## 2016-11-28 NOTE — Telephone Encounter (Signed)
Entered dental referral, please update/sign.

## 2016-11-28 NOTE — Telephone Encounter (Signed)
[facility] will not see patient unless referred by a regular dentist.  Will need new referral entered to [facility], as they take [facility].

## 2016-11-28 NOTE — Telephone Encounter (Signed)
I just spoke with Diplomatic Services operational officersecretary at [facility] and they do not take [place] care.  She advised that [facility] does not pay for any visits for adults.  The only way they will provide coverage is if the pt gets a referral for an oral surgeon and then they will cover the extraction.  However, the oral surgeon won't schedule them due to insurance.  Please advise

## 2016-11-28 NOTE — Telephone Encounter (Signed)
Referral placed

## 2016-11-28 NOTE — Telephone Encounter (Signed)
LMTCB  The patient will have to call [facility] to find out who is covered.

## 2016-11-29 ENCOUNTER — Encounter

## 2016-11-29 NOTE — Telephone Encounter (Signed)
Pt states he already has an appt set up and he is also transferring out of our practice and will be in to sign a release for records.

## 2016-11-29 NOTE — Addendum Note (Signed)
Addended by: Tomasa HostellerKIROUAC, Donyell Carrell L on: 11/29/2016 01:56 PM      Modules accepted: Orders

## 2016-11-29 NOTE — Telephone Encounter (Addendum)
Spoke with pt, advised of no more script and he will have to take tylenol until his app to have dental work to resolve this problem. He will be in to sign a release for his records to transfer to a new PCP. Removed oxycodone from his medication list.

## 2016-11-29 NOTE — Telephone Encounter (Signed)
I spoke with the patient and he stated he has an appt on Monday.  I did let him know Dr. Nash DimmerBuczynski is unable to refill his script even until Monday. I recommended he go to the ER if his mouth is bleeding and he said every time he goes to the ER they tell him to go see his PCP.  He was not very happy, he stated that Dr. Wendall PapaBuczynksi told him if he needed more to call our office.  He stated his wife was there to hear this.    I am calling the dentist office at [phone] to verify if he has an appt or not.  I called and he does not have any appt scheduled at all.   Regardless the pharmacy has notified us that this patient abuses this medication and really did not want to fill this for him.    When he went to the ER on 7/9 he was given tylenol when asked for pain medicine.

## 2016-11-29 NOTE — Telephone Encounter (Signed)
Pt name and DOB verified.    The patient is calling asking if he could have another script for Oxycodone. He states he was given a three day script, and took his last one this morning. He states he's spitting blood, and his tooth still really hurts. Writer advised the request would be sent. Patient states he has an appointment on Monday, but feels he can't last that long. Please call patient when available.     CB# [phone] (home)     Alysia L Hilliker

## 2016-11-29 NOTE — Telephone Encounter (Signed)
I said this was the last script I would be giving him.  I am not to give him anymore narcotics, none

## 2016-12-04 ENCOUNTER — Encounter

## 2016-12-06 ENCOUNTER — Emergency Department: Admit: 2016-12-07 | Payer: MEDICAID | Primary: Family Medicine

## 2016-12-06 DIAGNOSIS — S8392XA Sprain of unspecified site of left knee, initial encounter: Secondary | ICD-10-CM

## 2016-12-06 NOTE — ED Triage Notes (Signed)
One 5th stair last night, fell and twisted left knee, unable to walk on it

## 2016-12-06 NOTE — ED Provider Notes (Signed)
HPI     Patient is a 41 y.o. year old male who has pain in the L knee area  Radiation: from knee down to leg  Timing: started last night, he was walking down stairs and tripped/stumbled did not fall to ground  The quality of this is: sore  Severity: severe  Associated symptoms: swelling about knee  Alleviating factors:none, tried nsaids/apap & ice  Aggravating factors: walking, but he has been able to limp    Past Medical History:   Diagnosis Date   ??? CAD (coronary artery disease)    ??? CAD (coronary artery disease)     MI x 3   ??? Cardiac revascularization with aortocoronary bypass anastomosis    ??? Chronic dental pain     - multiple teeth remove   ??? Chronic knee pain    ??? Hypertension    ??? Myocardial infarct [facility](HCC)    ??? S/P CABG x 1 2007    CABG x 1, 2007, due to congenital heart disease--patient's explanation is that right coronary artery was never in the proper location (perfused left side of heart only) and he had repeat cabage surgery in 2012 to correct this.       Past Surgical History:   Procedure Laterality Date   ??? HX CHOLECYSTECTOMY     ??? HX OTHER SURGICAL      2 bypass surgeries   ??? HX OTHER SURGICAL  08/2010    Chelecystectomy 08/2010   ??? HX OTHER SURGICAL  11/30/2013    TOOTH EXTRACTION         Family History:   Problem Relation Age of Onset   ??? Heart Disease Mother    ??? Cancer Mother      BRAIN CANCER   ??? Diabetes Father    ??? Diabetes Brother    ??? Heart Disease Maternal Grandmother    ??? Dementia Maternal Grandmother    ??? Heart Disease Maternal Grandfather    ??? Heart Disease Paternal Grandmother    ??? Heart Disease Paternal Grandfather    ??? No Known Problems Other    ??? Heart Disease Brother        Social History     Social History   ??? Marital status: SINGLE     Spouse name: N/A   ??? Number of children: 0   ??? Years of education: N/A     Occupational History   ???       DISABILITY     Social History Main Topics   ??? Smoking status: Current Some Day Smoker     Types: Cigarettes   ??? Smokeless tobacco: Former NeurosurgeonUser       Comment: About 40-50 pack years, used to smoke 2 PPD but now down to 2 cigarettes a day   ??? Alcohol use No   ??? Drug use: No   ??? Sexual activity: Not on file     Other Topics Concern   ??? Not on file     Social History Narrative    He is from [place] but moved to [place] at age 41.  Has been between [place] and [address] [place]    Lives with wife, married since 2016     No children    Disability secondary to heart disease and syncopal episode    About 40-50 pack years, used to smoke 2 PPD but now down to 2 cigarettes a day    No EtOH    No drug use  ALLERGIES: Tramadol; Codeine; Morphine; Motrin [ibuprofen]; and Toradol [ketorolac]    Review of Systems   Constitutional: Negative for chills and fever.   HENT: Negative for sore throat.    Respiratory: Negative for cough and shortness of breath.    Cardiovascular: Negative for chest pain.   Gastrointestinal: Negative for abdominal pain, diarrhea and nausea.   Genitourinary: Negative for dysuria and hematuria.   Musculoskeletal: Positive for gait problem. Negative for joint swelling.   Skin: Negative for rash.   Neurological: Negative for syncope.   Psychiatric/Behavioral: Negative for behavioral problems.   All other systems reviewed and are negative.      Vitals:    12/06/16 2022   BP: 143/86   Pulse: 97   Resp: 18   Temp: 98.8 ??F (37.1 ??C)   SpO2: 98%   Weight: 127.9 kg (282 lb)   Height: 6\' 2"  (1.88 m)            Physical Exam   Constitutional: He is oriented to person, place, and time. He appears well-developed and well-nourished. No distress.   HENT:   Head: Normocephalic.   Eyes: Conjunctivae are normal.   Neck: Normal range of motion.   Cardiovascular: Normal rate.    Pulmonary/Chest: Effort normal.   Abdominal: He exhibits no distension.   Musculoskeletal: Normal range of motion.        Left knee: He exhibits swelling. He exhibits no effusion, no ecchymosis and no erythema. Tenderness found. Lateral joint line tenderness noted.         Left lower leg: He exhibits tenderness and swelling. He exhibits no bony tenderness.   Neurological: He is alert and oriented to person, place, and time.   Skin: Skin is warm and dry. He is not diaphoretic.   Psychiatric: He has a normal mood and affect. His behavior is normal.   Nursing note and vitals reviewed.       MDM  Number of Diagnoses or Management Options     Amount and/or Complexity of Data Reviewed  Tests in the radiology section of CPT??: reviewed          ED Course       Procedures    PT well known to our ED presents after fall witnessed by friend who reports he did not fall from feet to ground.  NO injury to any other area.  Reports numbness/tingling in foot but to entire foot not to a focal neuro area.  2+ DP & PT pulse.  No visible trauma on exam except some mild swelling to lateral knee area.  XR knee & tib/fib negative.  Possible ligamentous strain.  Will have pt continue nsaids/apap, ice, and may use ace wrap as well.  FU with PCP for re-eval if pain is ongoign and may consider MRI at that time if needed.

## 2016-12-07 ENCOUNTER — Inpatient Hospital Stay
Admit: 2016-12-07 | Discharge: 2016-12-07 | Disposition: A | Discharge status: Home / Self Care | Payer: MEDICAID | Attending: Emergency Medicine

## 2016-12-07 MED ORDER — OXYCODONE-ACETAMINOPHEN 5 MG-325 MG TAB
5-325 mg | ORAL | Status: AC
Start: 2016-12-07 — End: 2016-12-06
  Administered 2016-12-07: 03:00:00 via ORAL

## 2016-12-07 MED FILL — OXYCODONE-ACETAMINOPHEN 5 MG-325 MG TAB: 5-325 mg | ORAL | Qty: 2

## 2016-12-20 NOTE — Telephone Encounter (Signed)
[facility] from [facility] regarding this mutual patient.  She was looking into why and history of Percocet, I did let her know he received this once for tooth pain and asked for a refill of this a few days later.    I let her know we did not refill this, due to his pharmacy alerting us to him abusing his meds.  He told us he had a dentist appt with a provider where we referred him and I called and verified this which was not true either.  I asked what he was being seen there for and she stated chest pain.  She thanked up for the information.

## 2016-12-31 NOTE — Telephone Encounter (Signed)
He can follow up for pneumonia if he wants but there will be NO narcotic prescription under any circumstances.

## 2016-12-31 NOTE — Telephone Encounter (Signed)
LMTCB

## 2016-12-31 NOTE — Telephone Encounter (Signed)
Pt name and DOB verified.    Patient calling back    [phone] (home)   Dustin Carney

## 2016-12-31 NOTE — Telephone Encounter (Signed)
Dustin Carney BiblePat, I did look up, he was hospitalized for pneumonia but we had received a call 7/9 from the pharmacy that he has a history of narcotic abuse.  Also lied to us in the past of having an appt with oral surgeon.    We can schedule a follow up, with NO pain meds, which is what he is looking for.

## 2016-12-31 NOTE — Telephone Encounter (Signed)
Pt name and DOB verified.      Patient calling in stated he was [facility].  Patient stating that he was not told to make a follow up appointment but was calling in because he is still in a lot of pain due to coughing and all the medication he received.  Patient stating he was given 7 vicoden when he left the hospital which was Saturday Aug 11th. Still in a lot of pain wanting to see if there is anything else he can get for this pain.  Patient stating he has used all the medicine that he was given.        CB [phone]    Braxton FeathersLori A Paquette

## 2016-12-31 NOTE — Telephone Encounter (Signed)
I spoke with the patient and let him know we would like to schedule a hospital follow up but we are not prescribing any narcotics.  He says I'm not asking for the pneumonia, but this "S&&t hurts.    I did let hi know we are scheduling this visit but to know up front he is not getting any pain medicine and he acknowledged.

## 2017-01-02 ENCOUNTER — Ambulatory Visit: Admit: 2017-01-02 | Discharge: 2017-01-02 | Payer: MEDICAID | Attending: Internal Medicine | Primary: Family Medicine

## 2017-01-02 DIAGNOSIS — J189 Pneumonia, unspecified organism: Secondary | ICD-10-CM

## 2017-01-02 MED ORDER — ALBUTEROL SULFATE HFA 90 MCG/ACTUATION AEROSOL INHALER
90 mcg/actuation | RESPIRATORY_TRACT | 0 refills | Status: DC | PRN
Start: 2017-01-02 — End: 2019-03-03

## 2017-01-02 MED ORDER — ALBUTEROL SULFATE HFA 90 MCG/ACTUATION AEROSOL INHALER
90 mcg/actuation | RESPIRATORY_TRACT | 0 refills | Status: DC | PRN
Start: 2017-01-02 — End: 2017-01-02

## 2017-01-02 NOTE — Telephone Encounter (Signed)
Pt came out from his appointment today and was very upset. I had to go out and talk with him and his wife Desma Maximntonia.   The patient states that he was here for a hospital follow-up appointment and that he was unhappy with his provider because he feels like the provider yelled at him.   Pt states he did not ask for pain medication and the provider yelled in his face that he wasn't getting percocet and told the patient he didn't like him.    I reviewed the chart and it looks like the patient called specifically for pain medication two days ago, he was told that he would be seen in follow-up and that no pain meds would be prescribed.     After the patient left I reviewed with Dr. Nash DimmerBuczynski, he stated that the majority of the appointment was spent with the patient insisting he needed pain medications even though he was told on the phone and he acknowledged that no prescribing would happen today. After multiple attempts declining the patients request, Dr. Nash DimmerBuczynski gave a more firm answer of " I am not giving you percocet today."   He never yelled at the patient or stated that he did not like him.     Spoke to clinical staff who were in the area during this time, they overheard the patient yelling very loudly, they did hear Dr. Nash DimmerBuczynski firmly say no, but they did not describe this as yelling. They stated that the patient was the one yelling in the room.  The clinical staff stated that this patient has been concerning, when pulling the PMP report they saw that the patient is getting narcotics from 4 different states. He also came here for an oral pain related issue and was given pain medication until he could get in to be seen at the specialist. Patient stated the appointment was scheduled, but staff confirmed it was not.    After speaking to the patient he stated he wanted a different primary care provider in our system, I stated I would look into this for him. Upon  reviewing his chart I would be concerned about transferring this patient to another provider in our network. If we were to do so it would need to be for primary care only and no narcotic prescribing.     Dr. Katrinka BlazingSmith, could you review for possible next steps for this gentleman. He does not want to come back to see anyone in this office.

## 2017-01-02 NOTE — Progress Notes (Signed)
[facility]   [address]  [place] [id number]  [phone]    CHIEF COMPLAINT   Dustin Carney is a 41 y.o. male who presents to the office today for Pneumonia (hospital f/u).    HISTORY OF PRESENT ILLNESS     Pt presents for f/u visit for pneumonia.  Patient was feeling sick for about 2 days with dyspnea, fevers, chills and cough.  Because of worsening symptoms, [facility] multifactorial pneumonia with hypoxia.  During his stay he had oxygen requirement, up to 15 L on a ventimask with desats into low 80s off oxygen.  Pulm was consulted who felt he had pneumonia and pnuemonitis.  He was placed on broad spectrum antibiotics cefepime, vancomycin and azithromycin along with steroids.  Eventually over the next few days he improved and was discharged home on tapering steroid dose as well as augmentin of which he is taking now.  He went back to the hospital because of total body pain and was told to follow up with PCP for pain meds which is why he is here today.  He describes total body pain from his neck to his legs that bring him to tears.  He states he gets one hour of sleep a night for pain.  He requests something for pain to help him.  He says tylenol doesn't work, allergic to NSAIDS and tramadol and the only thing that works is vicodin.    MEDICATIONS     Current Outpatient Prescriptions   Medication Sig   ??? ASPIRIN LOW-STRENGTH PO Take  by mouth. ASPIRIN LOW STRENGTH 81MG  ORAL TABLET DISINTEGRATING  TAKE ONE PILL BY MOUTH DAILY   ??? METOPROLOL SUCCINATE PO Take  by mouth. METOPROLOL SUCCINATE ER 50 MG ORAL TABLET EXTENDED RELEASE  TAKE ONE PILL BY MOUTH DAILY   ??? atorvastatin (LIPITOR) 20 mg tablet Take 20 mg by mouth daily.     No current facility-administered medications for this visit.      There are no discontinued medications.    ALLERGIES     Allergies   Allergen Reactions   ??? Tramadol Hives   ??? Codeine Hives   ??? Morphine Hives    ??? Motrin [Ibuprofen] Hives   ??? Toradol [Ketorolac] Hives       REVIEW OF SYSTEMS   See HPI    ACTIVE MEDICAL PROBLEMS     Patient Active Problem List   Diagnosis Code   ??? Dental caries K02.9   ??? Hyperlipidemia E78.5   ??? Atherosclerosis of coronary artery I25.10   ??? Status post cholecystectomy Z90.49   ??? Major depressive disorder, recurrent episode, moderate (HCC) F33.1   ??? Obesity E66.9   ??? 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 Prediabetes R73.03   ??? Encounter for general adult medical examination without abnormal findings Z00.00   ??? Tobacco dependence syndrome F17.200   ??? 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 Tooth pain K08.89   ??? Knee pain, right M25.561   ??? 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 Syncope R55   ??? Preventative health care Z00.00       SOCIAL HISTORY     Social History     Social History Narrative    [place] 41.  Has been between [place] and [address] [place]    Lives with wife, married since 2016  No children    Disability secondary to heart disease and syncopal episode    About 40-50 pack years, used to smoke 2 PPD but now down to 2 cigarettes a day    No EtOH    No drug use       VITALS     Vitals:    01/02/17 1110   BP: 124/78   Pulse: 90   Resp: 18   Weight: 268 lb 12.8 oz (121.9 kg)     Body mass index is 34.51 kg/(m^2).    BP Readings from Last 3 Encounters:   01/02/17 124/78   12/06/16 143/86   07/09/16 124/70     Wt Readings from Last 3 Encounters:   01/02/17 268 lb 12.8 oz (121.9 kg)   12/06/16 282 lb (127.9 kg)   07/09/16 278 lb 9.6 oz (126.4 kg)       PHYSICAL EXAM     Not able to be performed     LABS & IMAGING     Abstract on 12/04/2016   Component Date Value Ref Range Status   ??? Hct, External 09/22/2016 36.8   Final       ORDERS & MEDS (NEW)   No orders of the defined types were placed in this encounter.      ASSESSMENT AND PLAN     There are no diagnoses linked to this encounter.  1) PNEUMONIA   Patient had fairly severe bilateral pneumonia that improved only after broad spectrum antibiotics as well as prednisone.  He had large O2 requirement but never required BiPAP . Currently, his breathing has improved but he states that he is in constant neck to leg pain and wants something for the pain.  He wanted narcotics to help with the pain.  I explained flatly that I would not give it to him.  He became agitated, very upset, saying that he is in a lot of pain and that he trusted me as his primary provider to help him deal with his pain.  Because I highly suspect that patient seeks for pain medicine as evidenced by several different narcotic prescriptions given out of state, using different pharmacies for his pain medicine, warning from pharmacy when I gave him script last time saying that he has several red flags for abuse, my own observation of the patient which including symptoms constantly changing when asking for pain (sometimes it is his teeth, sometimes his knee, this time its this total body pain), his demeanor changing when refused pain meds, his bargaining when refused pain meds ("OK ill take 10 then, OK 5 then!  Just a day please"), as well as the fact that the only time I have seen him on visits is for acute visits for pain.  I stated flatly and forcefully in the end (without yelling) that I would not give him percocet.  He and his girlfriend accused me of yelling at them and being unprofessional and denying a pain patient treatment.  The visit ended abruptly when they left the room.  I did not have a chance to examine patient or explain that I needed him to get a repeat xray in a month to document improvement.  I did send proair inhaler to his pharmacy however at his request    Cyndia Diver, MD      Follow-up Disposition: Not on File    Future Appointments  Date Time Provider Department Center   01/30/2017 9:00 AM Cyndia Diver, MD [facility] [place]  07/01/2017 9:20 AM Cyndia Diver, MD [place]  [facility] [place]       Cyndia Diver, MD  01/02/2017

## 2017-01-14 NOTE — Telephone Encounter (Signed)
As department medical director, I have been asked review this chart regarding recent behavior and his request to transfer.  I would of course reference our network policy regarding transfers.  He had been seen previously at our [facility]) and requested transfer to provider outside our network then transferred back in the last year so.  This note clearly documents some concerning behavior and there have been other concerns documented previously.    In following our policy, I believe it is reasonable to offer him another with the PCP within our network.  However it would be with a clear understanding that he would NOT be receiving pain medications.  We would also need to have current practice management and potential new PCP manager set clear behavioral expectations and strongly consider even a behavior contract prior to arranging formally transfer.

## 2017-01-15 NOTE — Telephone Encounter (Signed)
Dustin Carney,   I noticed this gentleman lives close to [facility] and you have providers who are accepting. I did do in person service recovery with them, however I think at the end of the day our office is not going to be the best fit for them. I do think that the patient will need someone for primary care only. Do you have someone this patient could see? If you have someone you can transfer him to we would need to make it clear that no pain medications will be prescribed.     Thanks!

## 2017-01-16 NOTE — Telephone Encounter (Signed)
I reviewed Norms note, Dr. Lonn GeorgiaSmiths recommendation and patients Centricity Chart. His request for narcotics is long standing (going back to 2015). His behavior when he does not get narcotics is aggressive and inappropriate (going back to 02/15/14 Phone note and continuing still), [facility] and no showed his appointment. He has [facility] (multiple times per the report scanned in centricity as recently as Spring 2018) and when he is not offered narcotics he gets upset and leaves against medical advice. There is also a Care Alert pop-up in Centricity that clearly states "No Narcotics".   For these reasons I do not feel that this would be an appropriate [facility] to [facility]. ??

## 2017-01-18 LAB — BMP (EXT)
Anion Gap (EXT): 15 mmol/L (ref 3–17)
BUN (EXT): 7 mg/dL — ABNORMAL LOW (ref 8–25)
CO2 (EXT): 22 mmol/L — ABNORMAL LOW (ref 23–32)
CalciumCalcium (EXT): 9.9 mg/dL (ref 8.5–10.5)
Chloride (EXT): 108 mmol/L (ref 98–108)
Creatinine (EXT): 0.87 mg/dL (ref 0.60–1.50)
GFR Estimated (Calc) (EXT): 109 mL/min/{1.73_m2} (ref >59)
Glucose (EXT): 81 mg/dL (ref 70–110)
Potassium (EXT): 3.9 mmol/L (ref 3.4–5.0)
Sodium (EXT): 145 mmol/L (ref 135–145)

## 2017-01-26 ENCOUNTER — Ambulatory Visit: Admitting: Emergency Medicine

## 2017-01-26 ENCOUNTER — Emergency Department
Admit: 2017-01-26 | Disposition: A | Discharge status: Home / Self Care | Source: Home / Self Care | Attending: Emergency Medicine | Admitting: Emergency Medicine

## 2017-01-26 ENCOUNTER — Emergency Department
Admit: 2017-01-26 | Disposition: A | Discharge status: Home / Self Care | Payer: No Typology Code available for payment source

## 2017-01-26 NOTE — ED Provider Notes (Signed)
[name]  [name], [name]  MRN: [id number]  Age: 41 yrs  Sex: Male  DOB: 1975-11-06  Arrival Date: 01/26/2017  Arrival Time: 13:41  Account#: [id number]  Bed E-Eye  PCP: Unknown, Pcp  Chief Complaint: Toothache  .  Presentation:  09/08  14:01 Presenting complaint: Patient states: Presents to ED with c/o   sm46        dental pain. Onset since last night. Reports pain to lower        right jaw "like it's cracked" and then another tooth on right        upper. Has dental appointment on Monday in [place].  14:01 Method Of Arrival: Walk In                                      sm46  14:01 Acuity: Adult 4                                                 sm46  .  Historical:  - Allergies:  14:03 Tramadol HCl;                                                   sm46  14:03 Toradol;                                                        sm46  14:03 Codeine;                                                        sm46  14:03 Motrin;                                                         sm46  - Home Meds:  14:03 Aspirin Oral [Active]; Lipitor Oral [Active]; Metoprolol        sm46        Tartrate Oral [Active]; Insulin [Active];  - PMHx:  14:03 Hypertension; CAD; Diabetes - IDDM;                             sm46  - PSHx:  14:03 CABG; Cholecystectomy;                                          sm46  .  - Social history: Smoking status: Patient uses tobacco    products, light tobacco smoker. Patient/guardian denies using    alcohol, street drugs, IV  drugs, No barriers to communication    noted, The patient speaks fluent [place], Speaks    appropriately for age.  .  .  Screening:  14:03 SEPSIS SCREENING - Heart Rate > 90 Yes SIRS Criteria (> = 2)    sm46        No. Safety screen: Patient feels safe. Suicide Screening:        Patient denies thoughts of harm. Fall Risk None identified.        Exposure Risk/Travel Screening: None identified.  .  Vital Signs:  13:42 BP 123 / 87 Right Arm Sitting (auto/reg); Pulse 91 Monitor;     tb6         Resp 16 Spontaneous; Temp 36.4(O); Pulse Ox 99% on R/A; Weight        115.67 kg (R); Height 6 ft. 4 in. (193.04 cm) (R); Pain 9/10;  13:42 Body Mass Index 31.04 (115.67 kg, 193.04 cm)                    tb6  .  .  [name], [name]  [id number]  [id number]  Page 1 of 3  %%PAGE  .  [name], [name]  MRN: [id number]  Age: 45 yrs  Sex: Male  DOB: 04/06/1976  Arrival Date: 01/26/2017  Arrival Time: 13:41  Account#: [id number]  Bed E-Eye  PCP: Unknown, Pcp  Chief Complaint: Toothache  .  Triage Assessment:  14:03 General: Appears in no apparent distress, Behavior is           sm46        appropriate for age, cooperative. Pain: Complains of pain in        right side, lower and upper dental. EENT: Reports pain to upper        and lower.  .  Assessment:  14:51 Reassessment: Evaluated by MD only.                             ps9  .  Observations:  13:41 Patient arrived in ED.                                          tb6  14:02 Triage Completed.                                               sm46  14:27 Patient Visited By: Renold Genta                               ad3  14:34 Patient Visited By: Renold Genta                               ad3  14:36 Registration completed.                                         bs  .  Dispensed Medications:  14:44 Drug: Penicillin VK 500 mg Route: PO;  ps9  14:51 Follow up: Response: No Adverse Reaction                        ps9  14:44 Drug: Percocet 1 PO - oxyCODONE-acetaminophen 5 mg-325 mg 1     ps9        tabs Route: PO;  14:51 Follow up: Patient took pill with him to administer at home     ps9        since he was driving.  .  .  Intake:  .  Interventions:  13:46 POC Test Accucheck POC is 110.                                  tb6  15:13 Demo Sheet Scanned into Chart                                   tc14  .  Outcome:  14:34 Discharge ordered by MD.                                        Novella Olive  14:51 Discharged to home. Condition: stable. Discharge  instructions   ps9        given to patient, Instructed on discharge instructions, follow        up and referral plans. medication usage, Demonstrated        understanding of instructions, medications, F/u with own        dentist. Discharge Assessment: Patient awake, alert and        oriented x 3. No cognitive and/or functional deficits noted.        Patient verbalized understanding of disposition instructions.        Chart Status Nursing note complete and electronically signed.  14:52 Patient left the ED.                                            ps9  .  [name], [name]  [id number]  [id number]  Page 2 of 3  %%PAGE  .  [name], [name]  MRN: [id number]  Age: 22 yrs  Sex: Male  DOB: 1975/06/18  Arrival Date: 01/26/2017  Arrival Time: 13:41  Account#: [id number]  Bed E-Eye  PCP: Unknown, Pcp  Chief Complaint: Toothache  .  [name]  Signatures:  [place], Bernice                         Reg  bs  Renold Genta                           MD   Vicente Serene, Patrice                        RN   ps9  [place], [place]                             RN   sm46  Marlana Latus, YNWG  tb6  Nena Polio                             tc14  .  .  .  .  .  .  .  .  .  .  .  .  .  .  .  .  .  .  .  .  .  .  .  .  .  .  .  .  .  .  .  .  .  .  .  [name], [name]  [id number]  [id number]  Page 3 of 3  .  %%END

## 2017-01-26 NOTE — ED Provider Notes (Signed)
[name]  [name], [name]  MRN: [id number]  Age: 41 yrs  Sex: Male  DOB: May 02, 1976  Arrival Date: 01/26/2017  Arrival Time: 13:41  Account#: [id number]  .  Working Diagnosis: Dental caries  PCP: Unknown, Pcp  .  Historical:  - Allergies: Tramadol HCl; Toradol; Codeine; Motrin;  - Home Meds: Aspirin Oral; Lipitor Oral; Metoprolol Tartrate Oral; Insulin;  - PMHx: Hypertension; CAD; Diabetes - IDDM;  - PSHx: CABG; Cholecystectomy;  - Social history: Smoking status: Patient uses tobacco    products, light tobacco smoker. Patient/guardian denies using    alcohol, street drugs, IV drugs, No barriers to communication    noted, The patient speaks fluent [place], Speaks    appropriately for age.  .  .  Vital Signs:  09/08  13:42 BP 123 / 87 Right Arm Sitting (auto/reg); Pulse 91 Monitor;     tb6        Resp 16 Spontaneous; Temp 36.4(O); Pulse Ox 99% on R/A; Weight        115.67 kg (R); Height 6 ft. 4 in. (193.04 cm) (R); Pain 9/10;  13:42 Body Mass Index 31.04 (115.67 kg, 193.04 cm)                    tb6  .  MDM:  .  09/08  14:12 Order name: EDIE_CAREPLAN; Complete Time: 14:27                 dispa  t  .  Dispensed Medications:  14:44 Drug: Penicillin VK 500 mg Route: PO;                           ps9  14:51 Follow up: Response: No Adverse Reaction                        ps9  14:44 Drug: Percocet 1 PO - oxyCODONE-acetaminophen 5 mg-325 mg 1     ps9        tabs Route: PO;  14:51 Follow up: Patient took pill with him to administer at home     ps9        since he was driving.  .  .  Attending Notes:  14:51 Attending HPI: HPI: 41 year old male with history of            ad3        hypertension and diabetes who is followed by a dentist in        [place] and is scheduled to undergo a tooth extraction        presents with a two-day history of increasing throbbing pain        with sensitivity to cold in the right upper molar area. Patient        reports pain is moderate constant and much on that side. He        states that he has  no teeth on the left side because it's been        extracted so it makes it difficult for him to chew. Denies any  .  Name:Daman, Finnean  [id number]  [id number]  Page 1 of 3  %%PAGE  .  Name: Jayd, Cadieux  MRN: [id number]  Age: 45 yrs  Sex: Male  DOB: Jun 04, 1975  Arrival Date: 01/26/2017  Arrival Time: 13:41  Account#: [id number]  .  Working Diagnosis: Dental caries  PCP: Unknown, Pcp  .  fever or chills no swelling. Patient has multiple drug        allergies including to Toradol and ibuprofen and is quite        limited regarding his choice of pain. He states that he        contacted his dentist who told him that he needs to have the        extraction benefits pain is really bad that he should go to the        emergency room. Attending ROS Constitutional: no fever malaise        or weight loss Eyes: no eye discharge no redness ENT: no        sorethroat Neck: no masses no pain Cardiovascular: no chest        pain no palpations Respiratory: no SOB no cough. Attending        Exam: My personal exam reveals Patient is playing video games        on his cell phone. There is obvious facial asymmetry head is        otherwise normocephalic atraumatic there is no trismus        intraorally there is poor dentition throughout with multiple        missing teeth there is swelling and erythema on the right upper        molar tooth with tenderness to percussion. There is decay there        is no pharyngeal injection uvula is midline no mandibular or        cervical lymphadenopathy noted. I have reviewed the Nurses        Notes. ED Course: 41 year old male with increasing toothache        patient is scheduled to undergo extraction on Monday but has        noted increasing pain and sensitivity to cold in the upper        right molar region. There is swelling around the tooth but no        facial swelling or erythema with no trismus. There is dental        caries and decay noted on exam. Patient will be started on         antibiotics he was given a Percocet here EDIE charts shows the        patient has been seen in multiple different emergency room for        various complaints including chest pain and toothache most        recently last month. My Working Impression: Dental caries.        Attending chart complete and electronically signed: Doran Stabler, MS MD.  .  Disposition Summary:  14:52 01/26/2017 14:34 Discharged to Home. Impression: Dental caries. ad3        Condition is Stable. Discharge Instructions: DENTAL CAVITY.        Prescriptions for penicillin V potassium 500 mg Oral Tablet -        take 1 tablet by ORAL route 3 times per day; 21 tablet. and        Forms are Medication Reconciliation Form. Follow up: Private        Physician; When: As Scheduled; Reason: Continuance of care.        Problem is new. Symptoms are unchanged.  .  Signatures:  Renold Genta  MD   Vicente Serene, Patrice                        RN   ps9  .  Name:Bollman, Sundiata  [id number]  [id number]  Page 2 of 3  %%PAGE  .  Name: Cylis, Ayars  MRN: [id number]  Age: 49 yrs  Sex: Male  DOB: [birth year]  Arrival Date: 01/26/2017  Arrival Time: 13:41  Account#: [id number]  .  Working Diagnosis: Dental caries  PCP: Unknown, Pcp  .  Terrial Rhodes                             RN   sm46  .  Corrections: (The following items were deleted from the chart)  14:52 14:34 01/26/2017 14:34 Discharged to Home. Impression: Dental   ps9        caries. Condition is Stable. Forms are Medication        Reconciliation Form. Follow up: Private Physician; When: As        Scheduled; Reason: Continuance of care. Problem is new.        Symptoms are unchanged. ad3  .  Document is preliminary until electronically or manually signed by the atte  nding physician  .  .  .  .  .  .  .  .  .  .  .  .  .  .  .  .  .  .  .  .  .  .  .  .  .  .  .  .  .  .  .  .  .  Name:Plaskett, Brittain  [id number]  [id number]  Page 3 of 3  .  %%END

## 2017-01-30 ENCOUNTER — Ambulatory Visit: Attending: Internal Medicine | Primary: Family Medicine

## 2017-01-31 NOTE — Telephone Encounter (Signed)
I attempted to contact patient today to discuss no shows; however, there was no answer.  No show letter sent.

## 2017-02-04 NOTE — Telephone Encounter (Signed)
This gentleman is not wanting to come back to this practice. We could offer to facilitate a transfer to an outside entity if the patient does not want to come back here.

## 2017-02-18 LAB — HX POINT OF CARE: HX GLUCOSE-POCT: 110 mg/dL (ref 70–139)

## 2017-03-01 ENCOUNTER — Ambulatory Visit

## 2017-03-02 ENCOUNTER — Ambulatory Visit: Admitting: Internal Medicine

## 2017-03-02 LAB — HX BASIC METABOLIC PANEL
CASE NUMBER: 2018286000354
HX ANION GAP: 10 (ref 3.0–11.0)
HX BUN: 10 mg/dL (ref 6.0–20.0)
HX CALCIUM LVL: 9.1 mg/dL (ref 8.5–10.5)
HX CHLORIDE: 108 mmol/L (ref 98.0–110.0)
HX CO2: 21 mmol/L (ref 21.0–32.0)
HX CREATININE: 0.766 mg/dL (ref 0.55–1.3)
HX GLUCOSE LVL: 86 mg/dL (ref 70.0–110.0)
HX POTASSIUM LVL: 4.5 mmol/L (ref 3.6–5.2)
HX SODIUM LVL: 139 mmol/L (ref 136.0–146.0)

## 2017-03-02 LAB — HX CBC W/ DIFF
CASE NUMBER: 2018286000211
CASE NUMBER: 2018286000354
HX ABSOLUTE NRBC COUNT: 0 10*3/uL
HX ABSOLUTE NRBC COUNT: 0 10*3/uL
HX HCT: 39.5 % (ref 39.0–53.0)
HX HCT: 45.1 % (ref 39.0–53.0)
HX HGB: 12.9 g/dL — ABNORMAL LOW (ref 13.0–17.5)
HX HGB: 14.2 g/dL (ref 13.0–17.5)
HX MCH: 30.8 pg (ref 26.0–34.0)
HX MCH: 30.8 pg (ref 26.0–34.0)
HX MCHC: 32.7 g/dL (ref 31.0–37.0)
HX MCHC: 31.5 g/dL (ref 31.0–37.0)
HX MCV: 94.3 fL (ref 80.0–100.0)
HX MCV: 97.8 fL (ref 80.0–100.0)
HX MPV: 10.9 fL (ref 9.4–12.4)
HX MPV: 10.7 fL (ref 9.4–12.4)
HX NRBC PERCENT: 0 %
HX NRBC PERCENT: 0 %
HX PLATELET: 234 10*3/uL (ref 150.0–400.0)
HX PLATELET: 195 10*3/uL (ref 150.0–400.0)
HX RBC: 4.19 10*6/uL — ABNORMAL LOW (ref 4.2–5.9)
HX RBC: 4.61 10*6/uL (ref 4.2–5.9)
HX RDW-CV: 13.5 % (ref 11.5–14.5)
HX RDW-CV: 13.5 % (ref 11.5–14.5)
HX RDW-SD: 46.7 fL (ref 35.0–51.0)
HX RDW-SD: 48.6 fL (ref 35.0–51.0)
HX WBC: 5.5 10*3/uL (ref 4.0–11.0)
HX WBC: 4.6 10*3/uL (ref 4.0–11.0)

## 2017-03-02 LAB — HX COMPREHENSIVE METABOLIC PANEL
CASE NUMBER: 2018286000211
HX ALBUMIN LVL: 3.8 g/dL (ref 3.2–5.0)
HX ALKALINE PHOSPHATASE: 57 U/L (ref 30.0–117.0)
HX ALT: 82 U/L — ABNORMAL HIGH (ref 6.0–55.0)
HX ANION GAP: 8 (ref 3.0–11.0)
HX AST: 47 U/L — ABNORMAL HIGH (ref 6.0–40.0)
HX BILIRUBIN TOTAL: 0.3 mg/dL (ref 0.2–1.2)
HX BUN: 11 mg/dL (ref 6.0–20.0)
HX CALCIUM LVL: 9.3 mg/dL (ref 8.5–10.5)
HX CHLORIDE: 106 mmol/L (ref 98.0–110.0)
HX CO2: 26 mmol/L (ref 21.0–32.0)
HX CREATININE: 0.959 mg/dL (ref 0.55–1.3)
HX GLUCOSE LVL: 118 mg/dL — ABNORMAL HIGH (ref 70.0–110.0)
HX POTASSIUM LVL: 4.2 mmol/L (ref 3.6–5.2)
HX SODIUM LVL: 140 mmol/L (ref 136.0–146.0)
HX TOTAL PROTEIN: 7 g/dL (ref 6.0–8.4)

## 2017-03-02 LAB — HX LIPASE LEVEL
CASE NUMBER: 2018286000211
HX LIPASE LVL: 139 U/L (ref 73.0–393.0)

## 2017-03-02 LAB — HX URINE DIPSTICK W/REFLEX
CASE NUMBER: 2018286000212
HX UA BILIRUBIN: NEGATIVE
HX UA BLOOD: NEGATIVE
HX UA GLUCOSE: NEGATIVE
HX UA KETONES: NEGATIVE
HX UA LEUKOCYTE ESTERASE: 25 WBC/uL — AB
HX UA NITRITE: NEGATIVE
HX UA PH: 5 (ref 5.0–8.0)
HX UA PROTEIN: NEGATIVE
HX UA RBC: 1 /HPF (ref 0.0–2.0)
HX UA SPECIFIC GRAVITY: 1.02 (ref 1.003–1.03)
HX UA SQUAMOUS EPITHELIAL: 2 /HPF (ref 0.0–5.0)
HX UA UROBILINOGEN: 2 — AB
HX UA WBC: 5 /HPF (ref 0.0–5.0)

## 2017-03-02 LAB — HX TROPONIN I
CASE NUMBER: 2018286000211
CASE NUMBER: 17042307
CASE NUMBER: 2018286001437
HX TROPONIN I: 0.016 ng/mL (ref 0.015–0.045)
HX TROPONIN I: 0.021 ng/mL (ref 0.015–0.045)
HX TROPONIN I: <0.015 (ref 0.015–0.045)

## 2017-03-02 LAB — HX NT-PROBNP
CASE NUMBER: 2018286000211
HX NT-PROBNP: 54 pg/mL (ref 0.0–450.0)

## 2017-03-02 LAB — HX DRUGS OF ABUSE URINE 5
CASE NUMBER: 2018286000212
HX U AMPHETAMINES SCRN: NEGATIVE
HX U BENZODIAZEPINE SCRN: NEGATIVE
HX U COCAINE SCRN: NEGATIVE
HX U ETHANOL INTERP: NEGATIVE
HX U ETHANOL: <3 (ref 0.0–49.0)
HX U OPIATE SCRN: POSITIVE — AB
HX U PH FOR DAU: 5 (ref 4.5–8.0)

## 2017-03-02 LAB — HX .AUTOMATED DIFF
CASE NUMBER: 2018286000211
CASE NUMBER: 2018286000354
HX ABSOLUTE BASO COUNT: 0.02 10*3/uL (ref 0.0–0.22)
HX ABSOLUTE BASO COUNT: 0.02 10*3/uL (ref 0.0–0.22)
HX ABSOLUTE EOS COUNT: 0.14 10*3/uL (ref 0.0–0.45)
HX ABSOLUTE EOS COUNT: 0.13 10*3/uL (ref 0.0–0.45)
HX ABSOLUTE LYMPHS COUNT: 1.98 10*3/uL (ref 0.74–5.04)
HX ABSOLUTE LYMPHS COUNT: 1.79 10*3/uL (ref 0.74–5.04)
HX ABSOLUTE MONO COUNT: 0.46 10*3/uL (ref 0.0–1.34)
HX ABSOLUTE MONO COUNT: 0.35 10*3/uL (ref 0.0–1.34)
HX ABSOLUTE NEUTRO COUNT: 2.88 10*3/uL (ref 1.48–7.95)
HX ABSOLUTE NEUTRO COUNT: 2.25 10*3/uL (ref 1.48–7.95)
HX BASOPHILS: 0.4 %
HX BASOPHILS: 0.4 %
HX EOSINOPHILS: 2.6 %
HX EOSINOPHILS: 2.9 %
HX IMMATURE GRANULOCYTES: 0.2 % (ref 0.0–2.0)
HX IMMATURE GRANULOCYTES: 0.4 % (ref 0.0–2.0)
HX LYMPHOCYTES: 36.1 %
HX LYMPHOCYTES: 39.3 %
HX MONOCYTES: 8.4 %
HX MONOCYTES: 7.7 %
HX NEUTROPHILS: 52.3 %
HX NEUTROPHILS: 49.3 %

## 2017-03-02 LAB — HX GLOMERULAR FILTRATION RATE (ESTIMATED)
CASE NUMBER: 2018286000211
HX AFN AMER GLOMERULAR FILTRATION RATE: >90
HX NON-AFN AMER GLOMERULAR FILTRATION RATE: >90

## 2017-03-02 LAB — HX MAGNESIUM LEVEL
CASE NUMBER: 2018286000211
HX MAGNESIUM LVL: 2 mg/dL (ref 1.8–2.5)

## 2017-03-02 LAB — HX BLUE TOP TO HOLD: CASE NUMBER: 2018286000211

## 2017-03-02 NOTE — Discharge Summary (Signed)
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 No history of  shortness of breath, palpitations no history of fever, chills    No History of nausea, vomiting, abdominal pain [1] Problem List/Past Medical  History    _Ongoing_    Chest pain    Diabetes    HTN    _Historical_    No qualifying data        PMH:    Reported MI at 41yo then again at 41yo with CABG at 41yo and redo and [facility]  in 2012 by report        SOCIAL HISTORY:    He smoked two packs a day for about 20 years or so but now he has cut down to  about two to three cigarettes a day. He adamantly denied any illicit drug use  or heavy alcohol use.            FAMILY HISTORY:    Significant for premature coronary artery disease in his family    [2]    Code Status    Code Status - Ordered    -- 03/02/17 1:26:00 EDT, Full Resuscitation, Constant Order    Allergies    Motrin    morphine    traMADOL    Toradol    Social History    _Alcohol_    Current    _Substance Abuse_ \- Denies Substance Abuse    _Tobacco_    Current, Cigarettes    Hospital Course    Atypical Chest Pain    admit him to telemetry floor    cycle his cardiac enzymes    Continue aspirin, statin, metoprolol    appreciate cardiology input    chest x-ray no acute infiltrative process    2.Diabetes mellitus    Hold metformin    continue insulin sliding scale    3.active smoker    Smoking cessation counseling    Continue Nicotinic patch    patient left AGAINST MEDICAL ADVICE    Procedures and Treatment Provided        * Final Report *        Reason For Exam    Chest Pain        FINAL REPORT    REPORT:  SITE READ:5        Procedure: XR Chest Two Views 03/02/2017 12:30 AM        Indications: Chest Pain        COMPARISON: Prior studies.        Views: 2        FINDINGS:        Tubes/lines: None.        Lungs/Pleura: The lungs are under inflated. Allowing for this, the lungs are    felt to be clear . There are no pleural effusions or pneumothorax.        Heart/Mediastinum: The cardiac mediastinal silhouette is unchanged . There has    been a median sternotomy.        Bones/Soft  Tissues: No acute bony changes.            IMPRESSION:        Clear lungs.                Janne Lab MD 03/02/2017 7:40 AM        Signature Line    ***** Final Report*****    Signed by: Esaw Dace MD, Timothy 03/02/17 7:40 am            XR Chest Two Views    This document has an image        [3]    Physical Exam    Vitals & Measurements    **T:** 97.9 F (Oral) **TMIN:** 96.7 F (Oral) **TMAX:** 98.3 F (Oral)  **HR:** 73 **RR:** 25 **BP:** 133/72 **O2 Method (L/min):** Room air    General: Alert and oriented, no acute distress    Eye: PERRL, Normal Conjunctiva    HEENT: Normocephalic, no damage to dentition, TM's clear, good light reflex,  normal hearing, moist oral mucosa, no pharyngeal erythema, ear canals patent,  no sinus tenderness    Neck: supple, nontender, carotid pulse WNL, no carotid bruit, no JVD, no  lymphadenopathy, no thyromegaly, full ROM    Respiratory: Lungs clear to auscultation, respirations non-labored, breath  sounds equal and regular, symmetrical chest expansion, no chest wall  tenderness    Cardiovascular: Normal Rate, normal rhythm, no gallop, good pulses equal in  all extremities, normal peripheral perfusion,    Gastrointestinal: Abdomen soft, non tender, non-distended, normal bowel sounds  all four quadrants, no organomegaly    Genitourinary: No CVA tenderness,    Lymphatics: no lymphadenopathy neck, axilla, groin    Musculoskeletal: normal ROM, normal strength, no tenderness, no swelling, no  deformity, normal gait     Integumentary: Skin intact, warm, pink, dry. No pallor, no rash.    Neurologic: Alert, Oriented, normal sensory, normal motor, no focal deficits.    Lab Results    Blood Glucose POC: 122 mg/dL High (71/06/26 94:85:46 EDT)    Glucose Lvl: 86 mg/dL (27/03/50 09:38:18 EDT)    BUN: 10 mg/dL (29/93/71 69:67:89 EDT)    Creatinine: 0.766 mg/dL (38/10/17 51:02:58 EDT)    Sodium Lvl: 139 mmol/L (03/02/17 09:49:00 EDT)    Potassium Lvl: 4.5 mmol/L (03/02/17 09:49:00 EDT)    Chloride: 108 mmol/L (03/02/17 09:49:00 EDT)    CO2: 21 mmol/L (03/02/17 09:49:00 EDT)    Anion Gap: 10 (03/02/17 09:49:00 EDT)    Calcium Lvl: 9.1 mg/dL (52/77/82 42:35:36 EDT)    Troponin I: <0.015 (03/02/17 15:22:00 EDT)    U Amphetamines Scrn: Negative (03/02/17 14:24:00 EDT)    U Benzodiazepine Scrn: Negative (03/02/17 14:24:00 EDT)    U Cocaine Scrn: Negative (03/02/17 14:24:00 EDT)    U Ethanol: <3 (03/02/17 14:24:00 EDT)    U Ethanol Interp: Negative (03/02/17 14:24:00 EDT)    U Opiate Scrn: Positive Abnormal (03/02/17 14:24:00 EDT)    Confirm Positive: Confirm Upon Request (03/02/17 14:24:00 EDT)    WBC: 4.6 thous/mm3 (03/02/17 09:49:00 EDT)    RBC: 4.61 Mil/mm3 (03/02/17 09:49:00 EDT)    Hgb: 14.2 Gm/dL (14/43/15 40:08:67 EDT)    Hct: 45.1 % (03/02/17 09:49:00 EDT)    Platelet: 195 thous/mm3 (03/02/17 09:49:00 EDT)    MCV: 97.8 fL (03/02/17 09:49:00 EDT)    MCH: 30.8 pGm (03/02/17 09:49:00 EDT)    MCHC: 31.5 Gm/dL (61/95/09 32:67:12 EDT)    RDW-SD: 48.6 fL (03/02/17 09:49:00 EDT)    MPV: 10.7  fL (03/02/17 09:49:00 EDT)    Absolute Neutro Count: 2.25 thous/mm3 (03/02/17 09:49:00 EDT)    Absolute Lymphs Count: 1.79 thous/mm3 (03/02/17 09:49:00 EDT)    Absolute Mono Count: 0.35 thous/mm3 (03/02/17 09:49:00 EDT)    Absolute Eos Count: 0.13 thous/mm3 (03/02/17 09:49:00 EDT)    Absolute Baso Count: 0.02 thous/mm3 (03/02/17 09:49:00 EDT)    Neutrophils: 49.3 % (03/02/17 09:49:00 EDT)    Lymphocytes: 39.3 % (03/02/17 09:49:00 EDT)    Monocytes: 7.7 %  (03/02/17 09:49:00 EDT)    Eosinophils: 2.9 % (03/02/17 09:49:00 EDT)    Basophils: 0.4 % (03/02/17 09:49:00 EDT)    Immature Granulocytes: 0.4 % (03/02/17 09:49:00 EDT)    NRBC Percent: 0 % (03/02/17 09:49:00 EDT)    Absolute NRBC Count: 0 thous/mm3 (03/02/17 09:49:00 EDT)    UA Color: Yellow1 (03/02/17 14:24:00 EDT)    UA Clarity: Clear1 (03/02/17 14:24:00 EDT)    UA Specific Gravity: 1.02 (03/02/17 14:24:00 EDT)    UA pH: 5 (03/02/17 14:24:00 EDT)    UA Protein: Negative1 (03/02/17 14:24:00 EDT)    UA Glucose: Negative1 (03/02/17 14:24:00 EDT)    UA Ketones: Negative1 (03/02/17 14:24:00 EDT)    UA Bilirubin: Negative1 (03/02/17 14:24:00 EDT)    UA Blood: Negative1 (03/02/17 14:24:00 EDT)    UA Urobilinogen: 2.0 Abnormal (03/02/17 14:24:00 EDT)    UA Nitrite: Negative1 (03/02/17 14:24:00 EDT)    UA Leukocyte Esterase: 25 Abnormal (03/02/17 14:24:00 EDT)    UA RBC: 1 /HPF (03/02/17 14:24:00 EDT)    UA WBC: 5 /HPF (03/02/17 14:24:00 EDT)    UA Squamous Epithelial: 2 /HPF (03/02/17 14:24:00 EDT)    UA Bacteria: None1 (03/02/17 14:24:00 EDT)    UA Mucous: Few (03/02/17 14:24:00 EDT)    Discharge Diagnoses    Chest pain    Chest Pain    Discharge Medications    _Discharge_    aspirin, 81 mg, PO, Daily    atorvastatin 40 mg oral tablet    glimepiride 2 mg oral tablet    metFORMIN 1000 mg oral tablet    metoprolol 25 mg oral tablet, 25 mg= 1 tab(s), PO, Daily    NovoLOG Mix 70/30 FlexPen subcutaneous suspension    Discharge Instructions    Counseling    Face to Face    [1] Admission H & P; Bennet Kujawa MD-HOSP, Caryn Bee 03/02/2017 11:18 EDT    [2] Admission H & P; Adrion Menz MD-HOSP, Caryn Bee 03/02/2017 11:18 EDT    [3] XR Chest Two Views; Esaw Dace MD, Marcial Pacas 03/02/2017 00:30 EDT    SIGNATURE LINE Electronically signed by Derry Lory MD-HOSP, Aydee Mcnew on  03/03/2017 at 08:27:38 EST

## 2017-03-02 NOTE — Consults (Signed)
Reason for Consultation    cc: chest pain, Dr. Layla Barter    History of Present Illness    HPI: 41yo with CAD s/p CABG 2012, HTN, DM, smoker, here with chest pain.  States that he has had chest pain for the past 36 hours, constant described as  left sided underneath his arm in the axillary area radiating down his left  arm. Constant, unrelated to activities and getting better but still remains  constant. No associated SOB, nausea, vomiting, diaphoresis but mildly sweaty  and SOB upon arrival.    -eating lunch now and not complaining of pain but states that it is still present.    -has not seen a cardiologist recently. Was followed at [facility] but has changed cardiologists to [facility] but has yet to meet him. Last stress test was prior to his bypass surgery. States he takes Percocet for his chest pains.    Review of Systems    Constitutional: No Fever, Chills, Sweats, Weakness, Fatigue, Decreased  Activity    Respiratory: No SOB, Cough, Sputum Production, Hemoptysis, Wheezing, Cyanosis,  Apnea    Gastrointestinal: No nausea, vomiting, diarrhea, constipation, heartburn,  abdominal pain, hematemesis    Musculoskeletal: No Back pain, neck pain, joint pain, muscle pain, decreased  ROM, trauma        All other ROS: unremarkable          Physical Exam    Vitals & Measurements    **T:** 98.3 F (Oral) **HR:** 60 **RR:** 14 **BP:** 133/72 **SpO2:** 98%    **HT:** 193 cm **WT:** 116 Kg **BMI:** 31.14    General: Alert and oriented, no acute distress    Neck: supple, nontender, carotid pulse WNL, no carotid bruit, no JVD, no  lymphadenopathy,    Respiratory: Lungs clear to auscultation, respirations non-labored, breath  sounds equal and regular, symmetrical chest expansion, no chest wall  tenderness    Cardiovascular: Normal Rate, normal rhythm, normal S1S2, no m/r/g    Gastrointestinal: Abdomen soft, non tender, non-distended, normal bowel sounds    no edema      Diagnostic Results    EKG SINUS RHYTHM    NORMAL ECG     Compared with prior tracing, no significant change is seen.    [1]        CXR Clear lungs.    [2]    Impression and Plan    41yo with CAD s/p CABG here with chest pain:        1) chest pain, atypical    2) CAD s/p CABG    3) smoker, active    4) DM    5) HL        -atypical chest pain symptoms unrelated to activity without ECG or blood tests suggestive of ACS. Would recommend conservative management for now with pain management and anticipate d/c in AM with followup with his cardiologist at [facility].        will follow          Problem List/Past Medical History    _Ongoing_    Chest pain    Diabetes    HTN    _Historical_    No qualifying data    CAD s/p CABG 2012 [facility])    HTN    DM      Medications    _Inpatient_    aspirin, 81 mg= 1 tab(s), PO, Daily    atorvastatin 40 mg oral tablet, 40 mg= 1 tab(s), PO, Daily    Dilaudid,  0.5 mg= 0.5 mL, IV Push, q4hr, PRN    glucagon, 1 mg= 1 EA, IM, ud, PRN    glucose 40% oral gel, 15 Gm= 39 mL, PO, ud, PRN    HumaLOG Low, 0-12 Unit(s), sc, QIDACHS    Metoprolol Tartrate (Lopressor), 25 mg= 1 tab(s), PO, Daily    _Home_    aspirin, 81 mg, PO, Daily    atorvastatin 40 mg oral tablet    glimepiride 2 mg oral tablet    metFORMIN 1000 mg oral tablet    metoprolol 25 mg oral tablet, 25 mg= 1 tab(s), PO, Daily    NovoLOG Mix 70/30 FlexPen subcutaneous suspension    Allergies    Motrin    morphine    traMADOL    Toradol    Social History    _Alcohol_    Current    _Substance Abuse_ \- Denies Substance Abuse    _Tobacco_    Current, Cigarettes    2-3 cigarettes/day.    works in Office manager at [employer]    Family History    mother- CAD    Lab Results    BUN: 10 mg/dL (05/29/30 35:57:32 EDT)    CO2: 21 mmol/L (03/02/17 09:49:00 EDT)    Creatinine: 0.766 mg/dL (20/25/42 70:62:37 EDT)    Glucose Lvl: 86 mg/dL (62/83/15 17:61:60 EDT)    Hct: 45.1 % (03/02/17 09:49:00 EDT)    NT-ProBNP: 54 pGm/ml (03/02/17 00:14:00 EDT)    Platelet: 195 thous/mm3 (03/02/17 09:49:00 EDT)     Potassium Lvl: 4.5 mmol/L (03/02/17 09:49:00 EDT)    Sodium Lvl: 139 mmol/L (03/02/17 09:49:00 EDT)    Troponin I: 0.021 nGm/ml (03/02/17 09:49:00 EDT)    WBC: 4.6 thous/mm3 (03/02/17 09:49:00 EDT)    OCT 13 09:49        139  108  10 /    _____________________ 86        4.5  21  0.766 \            ------        [1] EKG; Ulyses Southward MD, Cliffton Asters 03/02/2017 00:12 EDT    [2] XR Chest Two Views; Esaw Dace MD, Marcial Pacas 03/02/2017 00:30 EDT    SIGNATURE LINE Electronically signed by Audley Hose MD, Nile Dear on 03/02/2017  at 14:43:07 EST
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
## 2017-03-02 NOTE — ED Notes (Signed)
Please click on link to see document

## 2017-03-07 LAB — BMP (EXT)
Anion Gap (EXT): 12 mmol/L (ref 5–15)
BUN (EXT): 14 mg/dL (ref 6–20)
CO2 (EXT): 26 mmol/L (ref 22–31)
CalciumCalcium (EXT): 10 mg/dL (ref 8.4–10.2)
Chloride (EXT): 105 mmol/L (ref 101–113)
Creatinine (EXT): 1.02 mg/dL (ref 0.70–1.20)
GFR Estimated (Calc) (EXT): 92 mL/min/{1.73_m2} (ref >59)
Glucose (EXT): 89 mg/dL (ref 70–105)
Potassium (EXT): 4.1 mmol/L (ref 3.4–5.0)
Sodium (EXT): 143 mmol/L (ref 133–145)

## 2017-03-08 LAB — BMP (EXT)
Anion Gap (EXT): 12 mmol/L (ref 3–17)
Anion Gap (EXT): 11 mmol/L (ref 3–17)
BUN (EXT): 12 mg/dL (ref 8–25)
BUN (EXT): 11 mg/dL (ref 8–25)
CO2 (EXT): 21 mmol/L — ABNORMAL LOW (ref 23–32)
CO2 (EXT): 23 mmol/L (ref 23–32)
CalciumCalcium (EXT): 8.8 mg/dL (ref 8.5–10.5)
CalciumCalcium (EXT): 8.9 mg/dL (ref 8.5–10.5)
Chloride (EXT): 107 mmol/L (ref 98–108)
Chloride (EXT): 105 mmol/L (ref 98–108)
Creatinine (EXT): 0.83 mg/dL (ref 0.60–1.50)
Creatinine (EXT): 0.88 mg/dL (ref 0.60–1.50)
GFR Estimated (Calc) (EXT): 111 mL/min/{1.73_m2} (ref >59)
GFR Estimated (Calc) (EXT): 108 mL/min/{1.73_m2} (ref >59)
Glucose (EXT): 87 mg/dL (ref 70–110)
Glucose (EXT): 101 mg/dL (ref 70–110)
Potassium (EXT): 4.9 mmol/L (ref 3.4–5.0)
Potassium (EXT): 4.1 mmol/L (ref 3.4–5.0)
Sodium (EXT): 140 mmol/L (ref 135–145)
Sodium (EXT): 139 mmol/L (ref 135–145)

## 2017-03-08 LAB — LIPID PROFILE (EXT)
Cholesterol (EXT): 154 mg/dL (ref <200)
HDL Cholesterol (EXT): 36 mg/dL (ref 35–100)
LDL Cholesterol (EXT): 93 mg/dL (ref 50–129)
NON HDL Cholesterol (EXT): 118 mg/dL
Risk Factor (EXT): 4.3 (ref 0.0–5.0)
Triglycerides (EXT): 124 mg/dL (ref 40–150)

## 2017-03-08 LAB — HEMOGLOBIN A1C
Estimated Average Glucose mg/dL (INT/EXT): 143 mg/dL
HEMOGLOBIN A1C % (INT/EXT): 6.6 % — ABNORMAL HIGH (ref 4.3–6.4)

## 2017-03-11 ENCOUNTER — Ambulatory Visit: Admitting: Emergency Medicine

## 2017-03-11 ENCOUNTER — Ambulatory Visit

## 2017-03-11 LAB — HX CBC W/ DIFF
CASE NUMBER: 2018295000219
HX ABSOLUTE NRBC COUNT: 0 10*3/uL
HX HCT: 37.7 % — ABNORMAL LOW (ref 39.0–53.0)
HX HGB: 12.4 g/dL — ABNORMAL LOW (ref 13.0–17.5)
HX MCH: 31.2 pg (ref 26.0–34.0)
HX MCHC: 32.9 g/dL (ref 31.0–37.0)
HX MCV: 94.7 fL (ref 80.0–100.0)
HX MPV: 11 fL (ref 9.4–12.4)
HX NRBC PERCENT: 0 %
HX PLATELET: 238 10*3/uL (ref 150.0–400.0)
HX RBC: 3.98 10*6/uL — ABNORMAL LOW (ref 4.2–5.9)
HX RDW-CV: 13.4 % (ref 11.5–14.5)
HX RDW-SD: 46.9 fL (ref 35.0–51.0)
HX WBC: 5.5 10*3/uL (ref 4.0–11.0)

## 2017-03-11 LAB — HX DRUGS OF ABUSE URINE 5
CASE NUMBER: 2018295000308
HX U AMPHETAMINES SCRN: NEGATIVE
HX U BENZODIAZEPINE SCRN: NEGATIVE
HX U COCAINE SCRN: NEGATIVE
HX U ETHANOL INTERP: NEGATIVE
HX U ETHANOL: <3 (ref 0.0–49.0)
HX U OPIATE SCRN: NEGATIVE
HX U PH FOR DAU: 7 (ref 4.5–8.0)

## 2017-03-11 LAB — HX COMPREHENSIVE METABOLIC PANEL
CASE NUMBER: 2018295000219
HX ALBUMIN LVL: 3.4 g/dL (ref 3.2–5.0)
HX ALKALINE PHOSPHATASE: 59 U/L (ref 30.0–117.0)
HX ALT: 118 U/L — ABNORMAL HIGH (ref 6.0–55.0)
HX ANION GAP: 6 (ref 3.0–11.0)
HX AST: 59 U/L — ABNORMAL HIGH (ref 6.0–40.0)
HX BILIRUBIN TOTAL: 0.3 mg/dL (ref 0.2–1.2)
HX BUN: 12 mg/dL (ref 6.0–20.0)
HX CALCIUM LVL: 9 mg/dL (ref 8.5–10.5)
HX CHLORIDE: 110 mmol/L (ref 98.0–110.0)
HX CO2: 28 mmol/L (ref 21.0–32.0)
HX CREATININE: 0.85 mg/dL (ref 0.55–1.3)
HX GLUCOSE LVL: 88 mg/dL (ref 70.0–110.0)
HX POTASSIUM LVL: 4.1 mmol/L (ref 3.6–5.2)
HX SODIUM LVL: 144 mmol/L (ref 136.0–146.0)
HX TOTAL PROTEIN: 6.8 g/dL (ref 6.0–8.4)

## 2017-03-11 LAB — HX .AUTOMATED DIFF
CASE NUMBER: 2018295000219
HX ABSOLUTE BASO COUNT: 0.02 10*3/uL (ref 0.0–0.22)
HX ABSOLUTE EOS COUNT: 0.16 10*3/uL (ref 0.0–0.45)
HX ABSOLUTE LYMPHS COUNT: 2.23 10*3/uL (ref 0.74–5.04)
HX ABSOLUTE MONO COUNT: 0.56 10*3/uL (ref 0.0–1.34)
HX ABSOLUTE NEUTRO COUNT: 2.56 10*3/uL (ref 1.48–7.95)
HX BASOPHILS: 0.4 %
HX EOSINOPHILS: 2.9 %
HX IMMATURE GRANULOCYTES: 0.2 % (ref 0.0–2.0)
HX LYMPHOCYTES: 40.3 %
HX MONOCYTES: 10.1 %
HX NEUTROPHILS: 46.1 %

## 2017-03-11 LAB — HX GLOMERULAR FILTRATION RATE (ESTIMATED)
CASE NUMBER: 2018295000219
HX AFN AMER GLOMERULAR FILTRATION RATE: >90
HX NON-AFN AMER GLOMERULAR FILTRATION RATE: >90

## 2017-03-11 LAB — HX TROPONIN I
CASE NUMBER: 2018295000219
CASE NUMBER: 2018295000449
HX TROPONIN I: 0.018 ng/mL (ref 0.015–0.045)
HX TROPONIN I: <0.015 (ref 0.015–0.045)

## 2017-03-11 LAB — HX SST GOLD TUBE TO HOLD: CASE NUMBER: 2018295000219

## 2017-03-11 LAB — HX BLUE TOP TO HOLD: CASE NUMBER: 2018295000219

## 2017-03-11 LAB — HX GREEN TUBE TO HOLD: CASE NUMBER: 2018295000219

## 2017-03-11 NOTE — ED Notes (Signed)
Please click on link to see document

## 2017-03-11 NOTE — H&P (Signed)
Chief Complaint    chest pain    History of Present Illness    Pt uninterested in clinical interview, watching TV and eating, requesting to  increase dilaudid dose as soon as physician walked into the room, even though  his last dose was 5 mins ago. Hostile demeanor during interview, and not  elaborating on his history. Pt was reassured that if his current dose of  dilaudid where not effective in the next 10 mins, he should reach the nurse  and we could increase his pain medication until he is pain free. He also was  offered stat NTG but he declined it.        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 He is known to our service and several admission due to persistent  angina pectoris. He has been followed at [facility] but has  changed cardiologists to [facility]. Today he present with persistent L shoulder pain  radiated to his arm.    Review of Systems    CONSTITUTIONAL: No weight loss, fever, chills, weakness or fatigue.    HEENT: Eyes: No visual loss, blurred vision, double vision or yellow sclerae.  Ears, Nose, Throat: No hearing loss, sneezing, congestion, runny nose or sore  throat.    SKIN: No rash or itching.    CARDIOVASCULAR: chest pain    RESPIRATORY: No shortness of breath, cough or sputum.    GASTROINTESTINAL: No anorexia, nausea, vomiting or diarrhea. No abdominal pain  or blood.    GENITOURINARY: No Burning on urination.    NEUROLOGICAL: No headache, dizziness, syncope, paralysis, ataxia, numbness or  tingling in the extremities. No change in bowel or bladder control.    MUSCULOSKELETAL: No muscle, back pain, joint pain or stiffness.    HEMATOLOGIC: No anemia, bleeding or bruising.    LYMPHATICS: No enlarged nodes. No history of splenectomy.    PSYCHIATRIC: No history of depression or anxiety.    ENDOCRINOLOGIC: No reports of sweating, cold or heat intolerance. No  polyuria  or polydipsia.    ALLERGIES: No history of asthma, hives, eczema or rhinitis.    Code Status    Full code    Physical Exam    Vitals & Measurements    **T:** 98.1 F (Oral) **HR:** 55 **RR:** 17 **BP:** 151/66 **SpO2:** 100% **O2  Method (L/min):** Room air **WT:** 113 Kg    Limited to cardiopulmonary system, physician did not felt comfortable and felt  intimidated to perform a complete PE due to hostile environment during  interview        GENERAL: The patient is a well-developed, well-nourished male in no apparent  distress. He is alert and oriented x3.    NECK: Supple. No carotid bruits. No lymphadenopathy or thyromegaly.    LUNGS: Clear to auscultation.    HEART: Regular rate and rhythm without murmur.    EXTREMITIES: Without any cyanosis, clubbing, rash, lesions or edema.    NEUROLOGIC: Cranial nerves II through XII are grossly intact.    Impression and Plan    #Non anginal Chest pain    ASA given at the ER    Admit to a telemetry unit    Cycle cardiac troponin    EKG is reassuring, showing Sinus rhythm    Due to his hx of CAD we will consult cardiology in the mornign    CMS Two-Midnight Rule    Observation  Problem List/Past Medical History    1\. Coronary artery disease status post multiple MI and single vessel CABG in  2007 and CABG 2012 [facility])    2\. Anomalous right coronary artery.    3\. Hyperlipidemia    Procedure/Surgical History    CABG.    Social History    _Alcohol_    Current    _Substance Abuse_ \- Denies Substance Abuse    _Tobacco_    Current, Cigarettes    Family History    Allergies    Motrin    morphine    traMADOL    Toradol    Medications    _Inpatient_    aspirin, 81 mg= 1 tab(s), PO, Daily    aspirin 325 mg oral tablet, 325 mg= 1 tab(s), PO, Daily    atorvastatin 40 mg oral tablet, 40 mg= 1 tab(s), PO, Daily    Dextrose 50% For Protocol, per protocol, IV Push, ud, PRN    glucagon, 1 mg= 1 EA, IM, ud, PRN    glucose 40% oral gel, 15 Gm= 39 mL, PO, ud, PRN    Lantus BASAL  Insulin, 20 Unit(s)= 0.2 mL, sc, Daily    Lopressor, 25 mg= 1 tab(s), PO, BID    nitroglycerin oral, 0.4 mg= 1 tab(s), S L, q25min, PRN    _Home_    aspirin, 81 mg, PO, Daily    atorvastatin 40 mg oral tablet, 40 mg= 1 tab(s), PO, Daily    glimepiride 2 mg oral tablet, 2 mg= 1 tab(s), PO, Daily    metFORMIN 1000 mg oral tablet, 500 mg= 0.5 tab(s), PO, BID    Metoprolol Tartrate 25 mg oral tablet, 25 mg= 1 tab(s), PO, BID    NovoLOG Mix 70/30 FlexPen subcutaneous suspension, 10 units, sc, BIDAC    Diet    Low Sodium Diet - Ordered    -- 03/11/17 6:39:00 EDT, Room Service, Scheduled / PRN    Lab Results    Glucose Lvl: 88 mg/dL (57/84/69 62:95:28 EDT)    BUN: 12 mg/dL (41/32/44 01:02:72 EDT)    Creatinine: 0.85 mg/dL (53/66/44 03:47:42 EDT)    Afn Amer Glomerular Filtration Rate: >90 (03/11/17 03:53:00 EDT)    Brent Bulla Amer Glomerular Filtration Rate: >90 (03/11/17 03:53:00 EDT)    Sodium Lvl: 144 mmol/L (03/11/17 03:53:00 EDT)    Potassium Lvl: 4.1 mmol/L (03/11/17 03:53:00 EDT)    Chloride: 110 mmol/L (03/11/17 03:53:00 EDT)    CO2: 28 mmol/L (03/11/17 03:53:00 EDT)    Anion Gap: 6 (03/11/17 03:53:00 EDT)    Total Protein: 6.8 Gm/dL (59/56/38 75:64:33 EDT)    Albumin Lvl: 3.4 Gm/dL (29/51/88 41:66:06 EDT)    Calcium Lvl: 9 mg/dL (30/16/01 09:32:35 EDT)    Bilirubin Total: 0.3 mg/dL (57/32/20 25:42:70 EDT)    Alkaline Phosphatase: 59 Units/L (03/11/17 03:53:00 EDT)    AST: 59 Units/L High (03/11/17 03:53:00 EDT)    ALT: 118 Units/L High (03/11/17 03:53:00 EDT)    Troponin I: <0.015 (03/11/17 07:03:00 EDT)    U Amphetamines Scrn: Negative (03/11/17 04:36:00 EDT)    U Benzodiazepine Scrn: Negative (03/11/17 04:36:00 EDT)    U Cocaine Scrn: Negative (03/11/17 04:36:00 EDT)    U Ethanol: <3 (03/11/17 04:36:00 EDT)    U Ethanol Interp: Negative (03/11/17 04:36:00 EDT)    U Opiate Scrn: Negative (03/11/17 04:36:00 EDT)    Confirm Positive: Confirm Upon Request (03/11/17 04:36:00 EDT)    WBC: 5.5 thous/mm3 (03/11/17 03:53:00  EDT)    RBC: 3.98 Mil/mm3 Low (  03/11/17 03:53:00 EDT)    Hgb: 12.4 Gm/dL Low (96/29/52 84:13:24 EDT)    Hct: 37.7 % Low (03/11/17 03:53:00 EDT)    Platelet: 238 thous/mm3 (03/11/17 03:53:00 EDT)    MCV: 94.7 fL (03/11/17 03:53:00 EDT)    MCH: 31.2 pGm (03/11/17 03:53:00 EDT)    MCHC: 32.9 Gm/dL (40/10/27 25:36:64 EDT)    RDW-SD: 46.9 fL (03/11/17 03:53:00 EDT)    MPV: 11 fL (03/11/17 03:53:00 EDT)    Absolute Neutro Count: 2.56 thous/mm3 (03/11/17 03:53:00 EDT)    Absolute Lymphs Count: 2.23 thous/mm3 (03/11/17 03:53:00 EDT)    Absolute Mono Count: 0.56 thous/mm3 (03/11/17 03:53:00 EDT)    Absolute Eos Count: 0.16 thous/mm3 (03/11/17 03:53:00 EDT)    Absolute Baso Count: 0.02 thous/mm3 (03/11/17 03:53:00 EDT)    Neutrophils: 46.1 % (03/11/17 03:53:00 EDT)    Lymphocytes: 40.3 % (03/11/17 03:53:00 EDT)    Monocytes: 10.1 % (03/11/17 03:53:00 EDT)    Eosinophils: 2.9 % (03/11/17 03:53:00 EDT)    Basophils: 0.4 % (03/11/17 03:53:00 EDT)    Immature Granulocytes: 0.2 % (03/11/17 03:53:00 EDT)    NRBC Percent: 0 % (03/11/17 03:53:00 EDT)    Absolute NRBC Count: 0 thous/mm3 (03/11/17 03:53:00 EDT)    Blue Top Tube To Hold: DONE (03/11/17 03:53:00 EDT)    Diagnostic Results        ------        Len[place] LINE Electronically signed by Laurice Record MD, Ralphie Lovelady on  03/11/2017 at 09:45:50 EST

## 2017-03-11 NOTE — Telephone Encounter (Signed)
Have attempted to call on multiple occasions over the last several weeks, it appears the phone number in the chart [phone] is a non-working number. I am closing this encounter until we hear from the patient again.

## 2017-03-15 LAB — LIPID PROFILE (EXT)
Chol/HDL Ratio (EXT): 4.4 (ref <5.0)
Cholesterol (EXT): 149 mg/dL (ref <200)
HDL Cholesterol (EXT): 34 mg/dL — ABNORMAL LOW (ref 40–59)
LDL Cholesterol (EXT): 87 mg/dL (ref <100)
NON HDL Cholesterol (EXT): 115 mg/dL
Triglycerides (EXT): 140 mg/dL (ref <150)
VLDL Cholesterol (EXT): 28 mg/dL

## 2017-03-15 LAB — HEMOGLOBIN A1C
Estimated Average Glucose mg/dL (INT/EXT): 123 (calc)
Estimated Average Glucose mg/dL (INT/EXT): 6.8 (calc)
HEMOGLOBIN A1C % (INT/EXT): 5.9 %{Hb} — ABNORMAL HIGH (ref <5.7)

## 2017-03-20 LAB — LIPID PROFILE (EXT)
Cholesterol (EXT): 164 mg/dL
HDL Cholesterol (EXT): 33 mg/dL
LDL Cholesterol, CALC (EXT): 102 mg/dL
Triglycerides (EXT): 145 mg/dL (ref 40–200)

## 2017-03-20 LAB — HEMOGLOBIN A1C: HEMOGLOBIN A1C % (INT/EXT): 5.8 % (ref 4.0–6.0)

## 2017-03-21 LAB — BMP (EXT)
Anion Gap (EXT): 12 mmol/L (ref 3–17)
Anion Gap (EXT): 10 mmol/L (ref 3–17)
BUN (EXT): 12 mg/dL (ref 6–20)
BUN (EXT): 14 mg/dL (ref 6–20)
CO2 (EXT): 27 mmol/L (ref 22–32)
CO2 (EXT): 27 mmol/L (ref 22–32)
CalciumCalcium (EXT): 10 mg/dL (ref 8.9–10.3)
CalciumCalcium (EXT): 9.2 mg/dL (ref 8.9–10.3)
Chloride (EXT): 104 mmol/L (ref 98–107)
Chloride (EXT): 106 mmol/L (ref 98–107)
Creatinine (EXT): 0.91 mg/dL (ref 0.6–1.3)
Creatinine (EXT): 0.92 mg/dL (ref 0.6–1.3)
GFR Estimated (Calc) (EXT): 106 mL/min/{1.73_m2} (ref 60–128)
GFR Estimated (Calc) (EXT): 104 mL/min/{1.73_m2} (ref 60–128)
Glucose (EXT): 91 mg/dL (ref 65–99)
Glucose (EXT): 82 mg/dL (ref 65–99)
Potassium (EXT): 4.5 mmol/L (ref 3.6–5.1)
Potassium (EXT): 4.2 mmol/L (ref 3.6–5.1)
Sodium (EXT): 143 mmol/L (ref 136–145)
Sodium (EXT): 143 mmol/L (ref 136–145)

## 2017-03-22 ENCOUNTER — Emergency Department
Admit: 2017-03-22 | Disposition: A | Discharge status: Home / Self Care | Source: Home / Self Care | Attending: Emergency Medicine | Admitting: Emergency Medicine

## 2017-03-22 ENCOUNTER — Ambulatory Visit: Admitting: Emergency Medicine

## 2017-03-22 ENCOUNTER — Emergency Department
Admit: 2017-03-22 | Disposition: A | Discharge status: Home / Self Care | Payer: No Typology Code available for payment source

## 2017-03-22 LAB — HX CHEM-PANELS
HX ANION GAP: 8 (ref 3–14)
HX BLOOD UREA NITROGEN: 13 mg/dL (ref 6–24)
HX CHLORIDE (CL): 108 meq/L (ref 98–110)
HX CO2: 23 meq/L (ref 20–30)
HX CREATININE (CR): 0.93 mg/dL (ref 0.57–1.30)
HX GFR, AFRICAN AMERICAN: 119 mL/min/{1.73_m2}
HX GFR, NON-AFRICAN AMERICAN: 103 mL/min/{1.73_m2}
HX GLUCOSE: 97 mg/dL (ref 70–139)
HX POTASSIUM (K): 6 meq/L — AB (ref 3.6–5.1)
HX SODIUM (NA): 139 meq/L (ref 135–145)
HX WHOLE BLOOD POTASSIUM: 4 meq/L (ref 3.6–5.1)

## 2017-03-22 LAB — HX TRANSFUSION

## 2017-03-22 LAB — HX HEM-ROUTINE
HX BASO #: 0 10*3/uL (ref 0.0–0.2)
HX BASO: 0 %
HX EOSIN #: 0.2 10*3/uL (ref 0.0–0.5)
HX EOSIN: 3 %
HX HCT: 38.8 % (ref 37.0–47.0)
HX HGB: 12.6 g/dL — ABNORMAL LOW (ref 13.5–16.0)
HX IMMATURE GRANULOCYTE#: 0 10*3/uL (ref 0.0–0.1)
HX IMMATURE GRANULOCYTE: 0 %
HX LYMPH #: 2 10*3/uL (ref 1.0–4.0)
HX LYMPH: 38 %
HX MCH: 30.4 pg (ref 26.0–34.0)
HX MCHC: 32.5 g/dL (ref 32.0–36.0)
HX MCV: 93.7 fL (ref 80.0–98.0)
HX MONO #: 0.4 10*3/uL (ref 0.2–0.8)
HX MONO: 8 %
HX MPV: 11.1 fL (ref 9.1–11.7)
HX NEUT #: 2.6 10*3/uL (ref 1.5–7.5)
HX PLT: 228 10*3/uL (ref 150–400)
HX RBC BLOOD COUNT: 4.14 M/uL — ABNORMAL LOW (ref 4.20–5.50)
HX RDW: 13.3 % (ref 11.5–14.5)
HX SEG NEUT: 50 %
HX WBC: 5.2 10*3/uL (ref 4.0–11.0)

## 2017-03-22 LAB — HX TOXICOLOGY-DRUG, SERUM
HX ACETAMINOPHEN: <3 ug/mL — ABNORMAL LOW (ref 10–20)
HX BARBITUATES QL, SERUM: NOT DETECTED
HX BENZODIAZEPINES QL, SERUM: NOT DETECTED
HX ETHANOL: <10 mg/dL
HX SALICYLATE: <5 mg/dL — ABNORMAL LOW (ref 15.0–29.9)
HX TCA: NOT DETECTED

## 2017-03-22 LAB — HX COAGULATION
HX INR PT: 1 (ref 0.9–1.3)
HX PROTHROMBIN TIME: 11.3 s (ref 9.7–14.0)
HX PTT: 29.8 s (ref 25.7–35.7)

## 2017-03-22 LAB — HX DIABETES: HX GLUCOSE: 97 mg/dL (ref 70–139)

## 2017-03-22 LAB — HX CHEM-ENZ-FRAC
HX TROPONIN I: 0.02 ng/mL (ref 0.00–0.03)
HX TROPONIN I: <0.01 ng/mL (ref 0.00–0.03)

## 2017-03-22 NOTE — ED Provider Notes (Signed)
[name]  [name], [name]  MRN: [id number]  Age: 41 yrs  Sex: Male  DOB: [birth year]  Arrival Date: 03/22/2017  Arrival Time: 01:22  Account#: [id number]  .  Working Diagnosis: Chest pain, unspecified  PCP: No Pcp, Per, PATIENT  .  Historical:  - Allergies: Codeine; Motrin; Toradol; Tramadol HCl;  - Home Meds: Aspirin Oral; insulin; Lipitor Oral; Metoprolol Tartrate Oral;  - PMHx: CAD; Diabetes - IDDM; Hypertension; MI x3; High    Cholesterol;  - PSHx: CABG; Cholecystectomy;  .  .  Vital Signs:  11/02  01:23 BP 123 / 85 Right Arm Sitting (auto/reg); Pulse 87 Monitor;     eb4        Resp 16 Spontaneous; Temp 37.0(O); Pulse Ox 99% on R/A; Weight        113.4 kg (R); Height 6 ft. 3 in. (190.50 cm) (R); Pain 8/10;  02:00 BP 112 / 60; Pulse 73; Resp 22; Pulse Ox 99% ;                  jf16  02:18 BP 111 / 53; Resp 15;                                           jf16  02:19 BP 112 / 61; Pulse 67; Resp 16;                                 jf16  03:00 BP 113 / 53; Pulse 60; Resp 21;                                 jf16  04:00 BP 116 / 58; Pulse 59; Resp 18; Pulse Ox 99% ;                  jf16  05:37 BP 108 / 59; Pulse 84; Resp 14; Pulse Ox 100% ;                 jf16  01:23 Body Mass Index 31.25 (113.40 kg, 190.50 cm)                    eb4  .  MDM:  07:03 I have reviewed and agree with the scribe's documentation on my jr9        behalf.  .  11/02  01:39 Order name: BUN (Blood Urea Nitrogen); Complete Time: 02:59     jr9  11/02  01:39 Order name: CBC/Diff (With Plt); Complete Time: 02:59           jr9  11/02  01:39 Order name: CR (Creatinine); Complete Time: 02:59               jr9  11/02  01:39 Order name: GLU (Glucose); Complete Time: 02:59                 jr9  11/02  01:39 Order name: LYTES (Na, K, Cl, Co2); Complete Time: 02:59        jr9  11/02  01:39 Order name: PT (Prothrombin Time With INR); Complete Time: 02:59jr9  11/02  01:39 Order name: PTT; Complete Time: 02:59  jr9  11/02  01:39 Order  name: Troponin I; Complete Time: 02:59                    jr9  11/02  .  [name], [name]  [id number]  [id number]  Page 1 of 6  %%PAGE  .  [name], [name]  MRN: [id number]  Age: 68 yrs  Sex: Male  DOB: [birth year]  Arrival Date: 03/22/2017  Arrival Time: 01:22  Account#: [id number]  .  Working Diagnosis: Chest pain, unspecified  PCP: No Pcp, Per, PATIENT  .  01:42 Order name: EDIE_CAREPLAN; Complete Time: 01:57                 dispa  t  11/02  02:07 Order name: Urine Drug Screen                                   jr9  11/02  02:36 Order name: GFR, NAA; Complete Time: 02:59                      dispa  t  11/02  02:36 Order name: GFR, AA; Complete Time: 02:59                       dispa  t  11/02  02:36 Order name: Serum Drug Screen; Complete Time: 02:59             dispa  t  11/02  02:43 Order name: Blood Bank Hold; Complete Time: 02:59               dispa  t  11/02  01:39 Order name: Adult EKG (order using folder); Complete Time: 01:42jr9  11/02  01:39 Order name: Cardiac Monitor; Complete Time: 01:48               jr9  11/02  01:39 Order name: EKG (order using folder); Complete Time: 01:41      jr9  11/02  01:39 Order name: Pulse Oximetry Continuous; Complete Time: 01:48     jr9  11/02  02:07 Order name: ADD Tox Screen - Serum; Complete Time: 02:12        jr9  11/02  02:54 Order name: Whole Blood Potassium; Complete Time: 03:29         lr15  11/02  03:40 Order name: Troponin I; Complete Time: 06:25                    jr9  11/02  03:40 Order name: Adult EKG (order using folder); Complete Time: 03:40jr9  11/02  03:40 Order name: EKG Adult (order using folder); Complete Time: 03:40jr9  11/02  03:40 Order name: Diet, Clear liquid; Complete Time: 03:55            jr9  11/02  03:40 Order name: IV Saline Lock; Complete Time: 03:55                jr9  11/02  03:40 Order name: Refer to ED-OBS; Complete Time: 03:55               jr9  11/02  03:40 Order name: Serial EKG q 3 Hrs X 2; Complete Time: 04:07         jr9  .  Dispensed Medications:  01:48 Drug: Nitrostat 0.4 mg Route: Sublingual;  jf16  05:56 Follow up: Pt refused all but 1                                 jf16  02:04 Drug: Aspirin 81mg  x 4 324 mg Route: PO;                        jf16  .  [name], [name]  [id number]  [id number]  Page 2 of 6  %%PAGE  .  [name], [name]  MRN: [id number]  Age: 41 yrs  Sex: Male  DOB: 1975/07/22  Arrival Date: 03/22/2017  Arrival Time: 01:22  Account#: [id number]  .  Working Diagnosis: Chest pain, unspecified  PCP: No Pcp, Per, PATIENT  .  .  .  Attending Notes:  01:34 I have reviewed the Nurses Notes, Old Records in: [facility].      jr9/l  h15        Summary of the review of old record shows: Discharge summary        04/28/15 admitted for CP, signed out AMA prior to echo with 2        negative trops and no EKG changes, receiving multiple doses of        IV Dilaudid, sublingual NTG, and a nitro patch which apparently        was removed because it caused itchiness. On that admission,        pt had MIs age 53, 41, 28. CABG reportedly 2007 and 2012. I saw        him 08/2016, pt was unhappy with his care, left AMA after not        given Dilaudid [employer], Summary of the review of old record        shows: Reviewed EDIE_Careplan, pt with 29 ER visits in the last        12 months with 16 different hospitals all for CP, unspecified.  01:43 Attending HPI: Social History: Drinks alcohol Smokes 2          jr9/l  h15        cigs/day, denies drugs HPI: 41 y/o M on Metoprolol, baby ASA,        Lipitor with PMHx cardiac bypass for one vessel 11/08/05, second        bypass 04/2011, total hx 3 MI last 2013, presents for        evaluation of CP x 20 min PTA. Describes it like a squeezing        pain in the chest going down the L arm, up the jaw rated        9-9.5/10. Onset was when he was sitting down. Endorses        dizziness, nausea, and diaphoresis. Denies SOB, vomiting, abd        pain. Has not noticed recent leg  swelling. Pt states these sx        are similar with his previous MIs. Does not follow consistently        with his cardiologist at [facility], reportedly unable to schedule        appt. Attending ROS Constitutional: No fevers, no unexplained        weight gain/loss. Eyes: No blurry vision ENT: No hoarseness        Neck: No swollen lymph nodes Respiratory: No shortness of        breath Back: No back pain.  GU: No urinary frequency or dysuria        MS/Extremity: No leg swelling Cardiovascular: Positive for        chest pain, Radiating down L arm, up L jaw, Abdomen/GI:        Positive for nausea, Skin: Positive for diaphoresis, Neuro:        Positive for dizziness. Attending Exam: My personal exam        reveals General: Well appearing, NAD, nondiaphoretic, normal        mental status. Using remote control to check TV stations. Head:        NC/AT. Eyes: PERRL, clear conjunctivae. Neck: Supple, trachea        midline, no cervical LAD, no JVD. Respiratory/Chest: CTAB. CVS:        RRR, normal S1 and S2, no murmurs, no chest wall tenderness.        Sternotomy scar well healed, no reproducible tendereness.        Abdomen: Obese, soft, nontender. Extremities: LE symmetric, no        edema. Neuro: A/Ox3, face symmetric, lucid mentation.        Lab/Ancillary show: EKG interpreted by me and shows: NSR 75,  .  [name], [name]  ZHY:[id number]  [id number]  Page 3 of 6  %%PAGE  .  [name], [name]  MRN: [id number]  Age: 60 yrs  Sex: Male  DOB: 1976/05/14  Arrival Date: 03/22/2017  Arrival Time: 01:22  Account#: [id number]  .  Working Diagnosis: Chest pain, unspecified  PCP: No Pcp, Per, PATIENT  .        normal axis and intervals, no acute ST changes or TWI. QTC 411.        Compared to old 09/02/16, unchanged.  03:35 Lab/Ancillary show: Labs were reviewed and interpreted by me:   jr9/l  h15        Initial troponin 0.02. Initial potassium hemolyzed 6.0, repeat        4.0. CBC, renal function, and serum tox negative.  06:21 ED  Course: 41 y/o M with 29 ER visits in the last 12 months to  jr9/l  h15        16 different hospitals for CP, unspecified presents for        evaluation of CP. Throughout his ED observation, pt has        remained in NAD, laboratory workup negative including two        negative trops, EKG unremarkable and unchanged from old 08/2016.        Pt plan will be discharge with recommendations to follow up        with his cardiologist with verbal confirmation. My Working        Impression: CP, unspecified. Scribe Scribe Chart Complete By        signing my name below, I, [name], attest that this        documentation has been prepared under the direction and in the        presence of Dr. Joanna Hews, MD. Electronically signed by [name], Scribe, 03/22/2017.  07:03 Attending chart complete and electronically signed: Elwanda Brooklyn, MD, MPH, FACEP [phone]. I have personally        performed the services described in this documentation, as        written by my scribe above in my presence. It is both  accurate        and complete when signed by me. I have performed any pertinent        edits.  .  ED Observation:  03:36 REFER PATIENT TO ED OBSERVATION STATUS. The patient was         jr9/l  h15        informed of the need for further observational care. Diagnosis:        Chest Pain. Reason for ED Observation: At the time of referral        to ED Observation status, a more precise diagnosis is needed        and further observation and testing is required for diagnostic        accuracy. TREATMENT PLAN: EKG Monitoring Serial Cardiac Enzymes.  03:36 Family history No immediate family members are acutely ill.     jr9/l  h15  03:37 Progress Note: Sleeping, HR 65, first troponin nonelevated.     jr9/l  h15  05:32 Progress Note: Sleeping, initially not arousable to voice but   jr9/l  h15        easily wakes up with tactile stimuli. HR 56, pain free, second        trop returned <0.01.  06:24 Progress  Note: Pt awoken a second time, now at 6:15AM. Remains  jr9/l  h15        CP-free. Trop nonelevated. Encouraged again to f/u with a        cardiologist as pt has had 29 ER visits in the past 12 months.        Pt verbally agreed. Observation discharge summary: Clinical        Course: 41 y/o M with 29 ER visits in the last 12 months to 16        different hospitals for CP, unspecified presents for evaluation  .  [name], [name]  [id number]:[id number]  [id number]  Page 4 of 6  %%PAGE  .  [name], [name]  MRN: [id number]  Age: 66 yrs  Sex: Male  DOB: [birth year]  Arrival Date: 03/22/2017  Arrival Time: 01:22  Account#: [id number]  .  Working Diagnosis: Chest pain, unspecified  PCP: No Pcp, Per, PATIENT  .        of CP. Throughout his ED observation, pt has remained in NAD,        laboratory workup negative including two negative trops, EKG        unremarkable and unchanged from old 08/2016. Pt plan will be        discharge with recommendations to follow up with his        cardiologist with verbal confirmation My exam shows:        Cardiovascular exam: Normal Rate. Normal rhythm. Lung exam:        Lungs are clear to auscultation in all lung fields. Diagnosis:        CP, unspecified Disposition: Patient will be discharged from        the emergency department.  .  Disposition Summary:  03/22/17 06:25  Discharge Ordered        Location: Home                                                  jr9        Problem: an ongoing problem(03/22/17 06:25)  jr9        Symptoms: are resolved(03/22/17 06:25)                          jr9        Condition: Stable(03/22/17 06:25)                               Beryle Flock        Preliminary Diagnosis          - Chest pain, unspecified(03/22/17 06:25)                     Beryle Flock        Followup:                                                       jr9          - With: Private Physician          - When: 3 - 5 days          - Reason: Continuance of care        Discharge  Instructions:          - Discharge Summary Sheet                                     jr9          - CHEST PAIN, Uncertain Cause                                 jr9        Forms:          - Medication Reconciliation Form                              jr9  Signatures:  Dispatcher, [employer]                          dispa  Drue Stager                          MD   Ronalee Red                           RN   jf16  [name]                                lh15  Donnamarie Poag                           RN   lr15  Almon Hercules                               vs6  .  Corrections: (The following items were deleted from the chart)  01:36 01:34 I  have reviewed the Nurses Notes, jr9/lh15                jr9/l  h15  01:48 01:34 I have reviewed the Nurses Notes, Old Records in:         jr9/l  h15        [facility]. jr9/lh15  01:50 01:43 Lab/Ancillary show: EKG interpreted by me and shows: NSR  jr9/l  h15        75, normal axis and intervals, no acute ST changes or TWI. QTC  .  [name], [name]  ZOX:[id number]  [id number]  Page 5 of 6  %%PAGE  .  [name], [name]  MRN: [id number]  Age: 46 yrs  Sex: Male  DOB: 1976/04/05  Arrival Date: 03/22/2017  Arrival Time: 01:22  Account#: [id number]  .  Working Diagnosis: Chest pain, unspecified  PCP: No Pcp, Per, PATIENT  .        411 jr9/lh15  01:58 01:34 I have reviewed the Nurses Notes, Old Records in:         jr9/l  h15        [facility]. Summary of the review of old record shows: Discharge        summary 04/28/15 admitted for CP, signed out AMA prior to echo        with 2 negative trops and no EKG changes, receiving multiple        doses of IV Dilaudid, sublingual NTG, and a nitro patch which        apparently was removed because it caused itchiness. On that        admission, pt had MIs age 81, 10, 23. CABG reportedly 2007 and        2012. I saw him 08/2016, pt was unhappy with his care, left AMA        after not given Dilaudid jr9/lh15  03:43 03:41 jr9                                                        lr15  03:55 03:40 Serial Troponin q 3 Hrs X 2 ordered. jr9                  jf16  06:24 06:20 Observation discharge summary: Clinical Course: Pt awoken jr9/l  h15        a second time, now at 6:15AM. Remains CP-free. Trop        nonelevated. Encouraged again to f/u with a cardiologist as pt        has had 29 ER visits in the past 12 months. Pt verbally agreed        jr9/lh15  06:24 03:41 Observation jr9                                           jr9  06:24 03:41 Devin Going                                        jr9  06:24 03:41 ED Lonzo Cloud Beryle Flock  jr9  06:24 03:41 Stable jr9                                                jr9  06:24 03:41 new Carlena Hurl  06:24 03:41 have improved jr9                                         jr9  06:24 03:41 Regular jr9                                               jr9  06:24 03:41 Chest pain, unspecified jr9                               jr9  06:24 03:43 ED Obs lr15                                               jr9  06:30 06:24 Observation discharge summary: Clinical Course: 41 y/o M  jr9/l  h15        with 29 ER visits in the last 12 months to 16 different        hospitals for CP, unspecified presents for evaluation of CP.        Throughout his ED observation, pt has remained in NAD,        laboratory workup negative including two negative trops, EKG        unremarkable and unchanged from old 08/2016. Pt plan will be        discharge with recommendations to follow up with his        cardiologist with verbal confirmation My exam shows:        Cardiovascular exam: Normal Rate. Normal rhythm. Constitution:        Patient is afebrile. Non-toxic and well hydrated. Diagnosis:        CP, unspecified Disposition: Patient will be discharged from        the emergency department. jr9/lh15  .  Document is preliminary until electronically or manually signed by the atte  nding  physician  .  [name]  [name], [name]  ZOX:[id number]  [id number]  Page 6 of 6  .  %%END

## 2017-03-22 NOTE — ED Provider Notes (Signed)
[name]  [name], [name]  MRN: [id number]  Age: 41 yrs  Sex: Male  DOB: March 04, 1976  Arrival Date: 03/22/2017  Arrival Time: 01:22  Account#: [id number]  Bed A2  PCP: No Pcp, Per, PATIENT  Chief Complaint: Chest Pain  .  Presentation:  11/02  01:32 Presenting complaint: Patient states: pt stated approx 20 min   lr15        PTA he was sitting outside waiting for a ride when he developed        9.5/10 CP described as squeezing and dizziness pt stated pain        radiated down left arm and into jaw stated he has had 3 MI in        the past and this feels similar to then. Care prior to arrival:        None.  01:32 Method Of Arrival: Walk In                                      lr15  01:32 Acuity: Adult 2                                                 lr15  .  Historical:  - Allergies:  01:35 Codeine;                                                        lr15  01:35 Motrin;                                                         lr15  01:35 Toradol;                                                        lr15  01:35 Tramadol HCl;                                                   lr15  - Home Meds:  01:35 Aspirin Oral [Active]; insulin [Active]; Lipitor Oral [Active]; lr15        Metoprolol Tartrate Oral [Active];  - PMHx:  01:35 CAD; Diabetes - IDDM; Hypertension; MI x3; High Cholesterol;    lr15  - PSHx:  01:35 CABG; Cholecystectomy;                                          lr15  .  .  .  Screening:  01:35 SEPSIS SCREENING - Temp > 38.3 or < 36.0  No - Heart Rate > 90   lr15        No - Respiratory > 20 No - SBP < 90 No Does this patient have a        suspected source of infection at this timequestion No SIRS Criteria (>        = 2) No. Fall Risk None identified. Exposure Risk/Travel        Screening: None identified.  .  Vital Signs:  01:23 BP 123 / 85 Right Arm Sitting (auto/reg); Pulse 87 Monitor;     eb4        Resp 16 Spontaneous; Temp 37.0(O); Pulse Ox 99% on R/A; Weight        113.4 kg (R); Height 6 ft. 3 in.  (190.50 cm) (R); Pain 8/10;  02:00 BP 112 / 60; Pulse 73; Resp 22; Pulse Ox 99% ;                  jf16  02:18 BP 111 / 53; Resp 15;                                           jf16  02:19 BP 112 / 61; Pulse 67; Resp 16;                                 jf16  03:00 BP 113 / 53; Pulse 60; Resp 21;                                 jf16  .  [name], [name]  [id number]  [id number]  Page 1 of 4  %%PAGE  .  [name], [name]  MRN: [id number]  Age: 52 yrs  Sex: Male  DOB: Oct 09, 1975  Arrival Date: 03/22/2017  Arrival Time: 01:22  Account#: [id number]  Bed A2  PCP: No Pcp, Per, PATIENT  Chief Complaint: Chest Pain  .  04:00 BP 116 / 58; Pulse 59; Resp 18; Pulse Ox 99% ;                  jf16  05:37 BP 108 / 59; Pulse 84; Resp 14; Pulse Ox 100% ;                 jf16  01:23 Body Mass Index 31.25 (113.40 kg, 190.50 cm)                    eb4  .  Triage Assessment:  01:35 General: Appears in no apparent distress. Pain: Complains of    lr15        pain in chest the pain radiates to the: Pain radiates to left        arm and jaw Pain currently is 9 out of 10 on a pain scale.        Quality of pain is described as squeezing. Cardiovascular: No        deficits noted. Chest pain is described as Pain is 9.5 out of        10 on a pain scale. Respiratory: No deficits noted. Airway is        patent Respiratory effort is even, unlabored, relaxed,        Respiratory pattern is regular,  symmetrical.  .  Assessment:  02:08 Reassessment: Pt sts he had 9.5 chest pain, "Can I have the     jf16        remote". General: Appears in no apparent distress, comfortable,        obese, Behavior is agitated. Pain: Complains of pain in left        side chest.  02:24 Reassessment: Pt removed BP cuff and O2 sat.                    ZY60  05:37 Reassessment: Pt remained sleeping most shift. Pain: Noted to   jf16        be sleeping. Respiratory: Airway is patent Respiratory effort        is even, unlabored, Respiratory pattern is regular, O2 Sat         continuous monitoring. GI: No deficits noted. GU: No deficits        noted.  .  Observations:  01:22 Patient arrived in ED.                                          eb4  01:32 Patient Visited By: Devin Going  01:34 Triage Completed.                                               lr15  02:10 Nurse's Note: Pt at first said " I haven't gotten care in over  jf16        a year, I noted tape on his arms and needle stick marks on        bilat arms, he then said "I have my blood drawn all the time        because I had had heart attacks. I asked when he was last in a        hospital and he said over a week ago. His EDIE care plan showed        he has been seen in multiple hospital's in last few days, he        became angry and said "don't come in here with your attitude",        I said I'm asking because you said you hadn't gotten care in a        year then you said over a week ago and your report says you        were seen multiple times recently. Aspirin was given and 1 sub        lingual given.  02:14 Nurse's Note: Pt also said he would "not take nitro, it doesn't jf16  .  [name], [name]  [id number]  [id number]  Page 2 of 4  %%PAGE  .  [name], [name]  MRN: [id number]  Age: 34 yrs  Sex: Male  DOB: 06/03/1975  Arrival Date: 03/22/2017  Arrival Time: 01:22  Account#: [id number]  Bed A2  PCP: No Pcp, Per, PATIENT  Chief Complaint: Chest Pain  .        do anything but give me a headache", He then  agreed to take        only 1.  03:41 Patient Visited By: Devin Going  03:43 Patient assigned to A2                                          lr15  04:39 Registration completed.                                         mj11  04:52 Nurse's Note: Pt remains sleeping with no complaints of pain.   jf16  .  Procedure:  01:37 EKG done. (by ED staff). Old EKG Obtained Reviewed By: Arnaldo Natal        Rideout MD.  02:10 Inserted peripheral IV: 20  gauge in left hand.                  EU23  02:55 Whole Blood Potassium Sent.                                     lr15  04:41 Troponin I Sent.                                                lr15  .  Dispensed Medications:  01:48 Drug: Nitrostat 0.4 mg Route: Sublingual;                       jf16  05:56 Follow up: Pt refused all but 1                                 jf16  02:04 Drug: Aspirin 81mg  x 4 324 mg Route: PO;                        jf16  .  [name]  Intake:  .  Interventions:  01:35 Patient placed in exam room on stretcher.                       lr15  01:36 Demo Sheet Scanned into Chart                                   jm49  01:38 Cardiac monitor on. Pulse ox on. NIBP on.                       at17  03:30 ECG/EKG Scanned into Chart                                      vs6  .  Outcome:  03:41 Decision to Hospitalize by Provider.  jr9  06:24 Hospitalize undone.                                             jr9  06:25 Discharge ordered by MD.                                        Beryle Flock  06:39 Patient left the ED.                                            lr15  06:39 Discharged to home ambulatory. Condition: stable. Discharge     nd10        instructions given to patient, Instructed on discharge        instructions, follow up and referral plans. Demonstrated        understanding of instructions, Smoking cessation instructions        given to patient/caregiver. Discharge Assessment: Patient awake        and alert. Chart Status Nursing note complete and        electronically signed.  .  Corrections: (The following items were deleted from the chart)  .  [name], [name]  [id number]  [id number]  Page 3 of 4  %%PAGE  .  [name], [name]  MRN: [id number]  Age: 54 yrs  Sex: Male  DOB: 1976-03-25  Arrival Date: 03/22/2017  Arrival Time: 01:22  Account#: [id number]  Bed A2  PCP: No Pcp, Per, PATIENT  Chief Complaint: Chest Pain  .  01:49 01:37 EKG done. (by ED staff). Reviewed By: Drue Stager MD    [id number]        lr15  02:22 02:10 Nurse's Note: Pt at first said " I haven't gotten care in jf16        over a year, I noted tape on his arms and needle stick marks on        bilat arms, he then said "I have my blood drawn all the time        because I had had heart attacks. I asked when he was last in a        hospital and he said over a week ago. His EDIE care plan showed        he has been seen in multiple hospital's in last few days, he        became angry and said "don't come in here with your attitude",        I said I'm asking because you said you hadn't gotten care in a        year then you said over a week ago and your report says you        were seen multiple times recently. jf16  02:50 02:10 Nurse's Note: Pt at first said " I haven't gotten care in jf16        over a year, I noted tape on his arms and needle stick marks on        bilat arms, he then said "I have my blood drawn all the time        because I had had heart attacks. I asked when  he was last in a        hospital and he said over a week ago. His EDIE care plan showed        he has been seen in multiple hospital's in last few days, he        became angry and said "don't come in here with your attitude",        I said I'm asking because you said you hadn't gotten care in a        year then you said over a week ago and your report says you        were seen multiple times recently. Aspirin was given and 1 sub        lingual given. jf16  .  Signatures:  Drue Stager                          MD   Pamelia Hoit                         CCT  Adelino, Mathianaud                          mj11  Lenora Boys                           RN   jf16  Ellard Artis                        Sec  jm49  Tsomides, Marble                       CCT  at17  Donnamarie Poag                           RN   lr15  Blima Dessert                         BSN  nd10  MacArthur, Harlon Ditty                               kg13  [place], [place]                                vs6  .  .  .  .  .  .  .  .  [name], [name]  [id number]  [id number]  Page 4 of 4  .  %%END

## 2017-03-26 LAB — BMP (EXT)
Anion Gap (EXT): 9 (ref 5–10)
Anion Gap (EXT): 7 (ref 5–10)
BUN (EXT): 12 mg/dL (ref 6–20)
BUN (EXT): 15 mg/dL (ref 6–20)
CO2 (EXT): 26 mmol/L (ref 21–31)
CO2 (EXT): 27 mmol/L (ref 21–31)
CalciumCalcium (EXT): 9.8 mg/dL (ref 8.6–10.3)
CalciumCalcium (EXT): 9.6 mg/dL (ref 8.6–10.3)
Chloride (EXT): 104 mmol/L (ref 98–107)
Chloride (EXT): 107 mmol/L (ref 98–107)
Creatinine (EXT): 0.92 mg/dL (ref 0.90–1.30)
Creatinine (EXT): 0.89 mg/dL — ABNORMAL LOW (ref 0.90–1.30)
Glucose (EXT): 86 mg/dL (ref 70–99)
Glucose (EXT): 115 mg/dL — ABNORMAL HIGH (ref 70–99)
Potassium (EXT): 4.2 mmol/L (ref 3.5–5.1)
Potassium (EXT): 3.8 mmol/L (ref 3.5–5.1)
Sodium (EXT): 139 mmol/L (ref 136–145)
Sodium (EXT): 141 mmol/L (ref 136–145)

## 2017-03-28 LAB — BMP (EXT)
Anion Gap (EXT): 14 mmol/L (ref 7–17)
BUN (EXT): 14 mg/dL (ref 6–23)
CO2 (EXT): 25 mmol/L (ref 22–31)
CalciumCalcium (EXT): 9.3 mg/dL (ref 8.8–10.7)
Chloride (EXT): 103 mmol/L (ref 98–107)
Creatinine (EXT): 0.98 mg/dL (ref 0.50–1.20)
GFR Estimated (Calc) (EXT): 97 mL/min/{1.73_m2} (ref >59)
Glucose (EXT): 117 mg/dL — ABNORMAL HIGH (ref 70–100)
Potassium (EXT): 4 mmol/L (ref 3.4–5.0)
Sodium (EXT): 142 mmol/L (ref 136–145)

## 2017-03-31 LAB — BMP (EXT)
Anion Gap (EXT): 14 mmol/L (ref 3–17)
BUN (EXT): 13 mg/dL (ref 8–25)
CO2 (EXT): 24 mmol/L (ref 23–32)
CalciumCalcium (EXT): 10 mg/dL (ref 8.5–10.5)
Chloride (EXT): 102 mmol/L (ref 98–108)
Creatinine (EXT): 0.97 mg/dL (ref 0.60–1.50)
GFR Estimated (Calc) (EXT): 98 mL/min/{1.73_m2} (ref >59)
Glucose (EXT): 94 mg/dL (ref 70–110)
Potassium (EXT): 3.7 mmol/L (ref 3.4–5.0)
Sodium (EXT): 140 mmol/L (ref 135–145)

## 2017-05-12 LAB — BMP (EXT)
Anion Gap (EXT): 13 mmol/L (ref 3–17)
BUN (EXT): 11 mg/dL (ref 8–25)
CO2 (EXT): 24 mmol/L (ref 23–32)
CalciumCalcium (EXT): 9.1 mg/dL (ref 8.5–10.5)
Chloride (EXT): 104 mmol/L (ref 98–108)
Creatinine (EXT): 1 mg/dL (ref 0.60–1.50)
GFR Estimated (Calc) (EXT): 94 mL/min/{1.73_m2} (ref >59)
Glucose (EXT): 148 mg/dL — ABNORMAL HIGH (ref 70–110)
Potassium (EXT): 3.3 mmol/L — ABNORMAL LOW (ref 3.4–5.0)
Sodium (EXT): 141 mmol/L (ref 135–145)

## 2017-05-14 LAB — BMP (EXT)
Anion Gap (EXT): 7 (ref 5–10)
BUN (EXT): 14 mg/dL (ref 6–20)
CO2 (EXT): 25 mmol/L (ref 21–31)
CalciumCalcium (EXT): 9.5 mg/dL (ref 8.6–10.3)
Chloride (EXT): 107 mmol/L (ref 98–107)
Creatinine (EXT): 1.08 mg/dL (ref 0.90–1.30)
Glucose (EXT): 89 mg/dL (ref 70–99)
Potassium (EXT): 4.5 mmol/L (ref 3.5–5.1)
Sodium (EXT): 139 mmol/L (ref 136–145)

## 2017-05-14 LAB — ESTIMATED GLOMERULAR FILTRATION RATE CARE EVERYWHERE: ESTIMATED GFR CARE EVERYWHERE: >60 mL/min/{1.73_m2} (ref >=60.0)

## 2017-05-17 ENCOUNTER — Ambulatory Visit: Admitting: Emergency Medicine

## 2017-05-17 ENCOUNTER — Emergency Department: Admit: 2017-05-17 | Disposition: A | Discharge status: Home / Self Care | Source: Home / Self Care

## 2017-05-17 ENCOUNTER — Emergency Department
Admit: 2017-05-17 | Disposition: A | Discharge status: Home / Self Care | Payer: No Typology Code available for payment source

## 2017-05-17 NOTE — ED Provider Notes (Signed)
[name]  Name: [name], [name]  MRN: [id number]  Age: 41 yrs  Sex: Male  DOB: Jul 27, 1975  Arrival Date: 05/17/2017  Arrival Time: 16:17  Account#: [id number]  Bed E-Eye  PCP:  Chief Complaint: Dental Problem,  - Mouth Pain/Mouth Swelling  .  Presentation:  12/28  16:19 Presenting complaint: Patient states: Patient to ED c/o pain to am33        R lower teeth and L upper teeth x 30 minutes. States has an        appointment next week with dental. Denies fever, chills,        difficulty swallowing or breathing. + mild swelling to R lower        jaw.  16:19 Method Of Arrival: Walk In                                      am33  16:19 Acuity: Adult 4                                                 am33  .  Historical:  - Allergies:  16:21 Motrin;                                                         am33  16:21 Toradol;                                                        am33  16:21 Tramadol HCl;                                                   am33  - Home Meds:  16:21 Aspirin Oral [Active]; Lipitor Oral [Active]; Metoprolol        am33        Tartrate Oral [Active];  - PMHx:  16:21 CAD; Diabetes - IDDM; High Cholesterol; Hypertension; mi x3;    am33  - PSHx:  16:21 Cholecystectomy; CABG;                                          am33  .  - Social history: Smoking status: Patient uses tobacco    products, current every day smoker.  .  .  Screening:  16:21 SEPSIS SCREENING - Temp > 38.3 or < 36.0 No - Heart Rate > 90   am33        No - Respiratory > 20 No - SBP < 90 No Does this patient have a        suspected source of infection at this timequestion No SIRS Criteria (>        = 2) No. Safety  screen: Patient feels safe. Suicide Screening:        Patients presentation: No risk factors. Fall Risk None        identified. Exposure Risk/Travel Screening: None identified.  .  Vital Signs:  16:21 BP 126 / 85; Pulse 88; Resp 17; Temp 37(O); Pulse Ox 99% on     am33        R/A; Pain 8/10;  16:28 Pain 10/10;                                                      sk12  17:33                                                                 sk12  17:33 refused by patient - patient left prior to formal discharge     sk12  .  [name]  [name], [name]  [id number]  [id number]  Page 1 of 3  %%PAGE  .  Name: [name], [name]  MRN: [id number]  Age: 39 yrs  Sex: Male  DOB: 1975-07-04  Arrival Date: 05/17/2017  Arrival Time: 16:17  Account#: [id number]  Bed E-Eye  PCP:  Chief Complaint: Dental Problem,  - Mouth Pain/Mouth Swelling  .  Neuro Vital Signs:  16:28 GCS: 15,                                                        sk12  .  Triage Assessment:  16:21 General: Appears in no apparent distress, comfortable, Behavior am33        is cooperative. Pain: Complains of pain in mouth Pain currently        is 8 out of 10 on a pain scale. Neuro: No deficits noted. EENT:        No deficits noted. Cardiovascular: No deficits noted.        Respiratory: No deficits noted. GI: No deficits noted. GU: No        deficits noted. Skin: No deficits noted.  .  Assessment:  16:27 General: Appears in no apparent distress, comfortable, Behavior sk12        is appropriate for age, cooperative, pleasant, patient reports        45 minutes of diffuse dental pain, patient reports recent        extractions due to poor dentation in "[place]" states        pain at the site, patient has very few teeth. Pain: Complains        of pain in mouth. Nursing diagnosis: Alteration in comfort:        actual.  .  Observations:  16:17 Patient arrived in ED.                                          hh10  16:20 Triage Completed.  am33  16:31 Patient Visited By: Alysia Penna                               mm34  16:44 Patient Visited By: Alysia Penna                               mm34  16:54 Nurse's Note: patient asking for pain medication, patient was   sk12        offered tylenol , patient angry states "I've been taking that,        that is the only  reason I came in, I want something stronger        than tylenol".  17:04 Nurse's Note: MD to speak with patient prior to discharge       sk12        concerning pain medication.  .  Dispensed Medications:  16:49 Drug: Penicillin VK 500 mg Route: PO;                           sk12  .  [name]  Interventions:  16:21 Patient placed in exam room on stretcher.                       QI69  16:23 Patient Interventions patient placed in exam room. Armband on.  sk12  16:28 Call light in reach.                                            sk12  16:45 Demo Sheet Scanned into Chart                                   ew  .  .  [name], [name]  [id number]  [id number]  Page 2 of 3  %%PAGE  .  Name: [name], [name]  MRN: [id number]  Age: 16 yrs  Sex: Male  DOB: 11-Dec-1975  Arrival Date: 05/17/2017  Arrival Time: 16:17  Account#: [id number]  Bed E-Eye  PCP:  Chief Complaint: Dental Problem,  - Mouth Pain/Mouth Swelling  .  Outcome:  16:44 Discharge ordered by MD.                                        OZ36  17:33 Discharged to home ambulatory. Condition: good Condition:       sk12        unchanged. Discharge Assessment: Patient awake, alert and        oriented x 3. No cognitive and/or functional deficits noted.        Patient verbalized understanding of disposition instructions.        Chart Status Nursing note complete and electronically signed.  17:34 Patient left the ED.                                            sk12  .  Signatures:  Willette Cluster  Sec  ew  Valarie Merino                          RN   sk12  Alysia Penna                           MD   mm34  Ulyses Southward                            BSN  am33  Whalan, [place]                            hh10  .  .  .  .  .  .  .  .  .  .  .  .  .  .  .  .  .  .  .  .  .  .  .  .  .  .  .  .  [name], [name]  [id number]  [id number]  Page 3 of 3  .  %%END

## 2017-05-17 NOTE — ED Provider Notes (Signed)
[name]  Name: Makale, Pindell  MRN: [id number]  Age: 41 yrs  Sex: Male  DOB: [birth year]  Arrival Date: 05/17/2017  Arrival Time: 16:17  Account#: [id number]  .  Working Diagnosis: Periapical abscess without sinus - left upper bicuspid,    Dental caries  PCP:  .  Historical:  - Allergies: Motrin; Toradol; Tramadol HCl;  - Home Meds: Aspirin Oral; Lipitor Oral; Metoprolol Tartrate Oral;  - PMHx: CAD; Diabetes - IDDM; High Cholesterol; Hypertension;    mi x3;  - PSHx: Cholecystectomy; CABG;  - Social history: Smoking status: Patient uses tobacco    products, current every day smoker.  .  .  Vital Signs:  12/28  16:21 BP 126 / 85; Pulse 88; Resp 17; Temp 37(O); Pulse Ox 99% on     am33        R/A; Pain 8/10;  16:28 Pain 10/10;                                                     sk12  17:33                                                                 sk12  17:33 refused by patient - patient left prior to formal discharge     sk12  .  Neuro Vital Signs:  16:28 GCS: 15,                                                        sk12  .  MDM:  .  12/28  16:19 Order name: Wyvonnia Dusky; Complete Time: 17:00             dispa  t  12/28  16:20 Order name: EDIE_CAREPLAN; Complete Time: 17:00                 dispa  t  .  Dispensed Medications:  16:49 Drug: Penicillin VK 500 mg Route: PO;                           sk12  .  [name]  Attending Notes:  16:31 I have reviewed the Nurses Notes, Old Records in: [facility]. and  mm34        I agree.  17:02 ED Course: ASKING FOR STRONGER PAIN MEDS. HAS REPORTED NSAID    mm34        AND ULTRAM ALLERGY OFFERED ABX ONLY. STATES "I ONLY CAME HERE        BC I NEEDED SOMETHING STRONGER". REVIEW OF OLD RECORDS: 34        SEPARATE VISITS OVER PAST 3 MONTHS AT 74 DIFFERENT FACILITIES.        EXPRESSED MY CONCERN ABOUT INAPPROPRIATE USE OF EMERGENCY        DEPTS. I TOLD HIM I DID NOT FEEL COMFORTABLE GIVING PAIN        MEDICATION WITH  THIS VISIT HISTORY. My Working Impression: DRUG  .  Name:Seago,  Belton  [id number]  [id number]  Page 1 of 2  %%PAGE  .  Name: Usher, Hedberg  MRN: [id number]  Age: 58 yrs  Sex: Male  DOB: 04-Oct-1975  Arrival Date: 05/17/2017  Arrival Time: 16:17  Account#: [id number]  .  Working Diagnosis: Periapical abscess without sinus - left upper bicuspid,    Dental caries  PCP:  .        SEEKING BEHAVIOR. periapical abscess left maxillary bicuspid.        severe chronic dental caries.  .  Disposition Summary:  05/17/17 16:44  Discharge Ordered        Location: Home                                                  mm34        Problem: new                                                    mm34        Symptoms: are unchanged                                         mm34        Condition: Stable                                               mm34        Preliminary Diagnosis          - Periapical abscess without sinus                            mm34          - Dental caries                                               mm34        Followup:                                                       mm34          - [facility]: [facility]          - When: 830am weekday Walk in clnic          - Reason: As needed        Discharge Instructions:          - Discharge Summary Sheet  mm34          - DENTAL CAVITY                                               mm34          - TOOTH ABSCESS                                               mm34        Forms:          - Medication Reconciliation Form                              mm34        Prescriptions:          - penicillin V potassium 500 mg Oral Tablet              - take 1 tablet by ORAL route every 6 hours for 7 days;   mm34        28 tablet; Refills: 0, Product Selection Permitted  Signatures:  Valarie Merino                          RN   sk12  Alysia Penna                           MD   mm34  Ulyses Southward                            BSN  [id number]  .  Document is preliminary until electronically or manually signed by  the atte  nding physician  .  .  .  .  .  .  .  .  .  .  Name:Cordts, Kenn  [id number]  [id number]  Page 2 of 2  .  %%END

## 2017-05-18 ENCOUNTER — Ambulatory Visit: Admitting: Emergency Medicine

## 2017-05-18 ENCOUNTER — Ambulatory Visit: Admitting: Internal Medicine

## 2017-05-18 LAB — HX COMPREHENSIVE METABOLIC PANEL
CASE NUMBER: 2018363001663
HX ALBUMIN LVL: 3.5 g/dL (ref 3.2–5.0)
HX ALKALINE PHOSPHATASE: 58 U/L (ref 30.0–117.0)
HX ALT: 126 U/L — ABNORMAL HIGH (ref 6.0–55.0)
HX ANION GAP: 8 (ref 3.0–11.0)
HX AST: 78 U/L — ABNORMAL HIGH (ref 6.0–40.0)
HX BILIRUBIN TOTAL: 0.4 mg/dL (ref 0.2–1.2)
HX BUN: 11 mg/dL (ref 6.0–20.0)
HX CALCIUM LVL: 9 mg/dL (ref 8.5–10.5)
HX CHLORIDE: 110 mmol/L (ref 98.0–110.0)
HX CO2: 24 mmol/L (ref 21.0–32.0)
HX CREATININE: 0.781 mg/dL (ref 0.55–1.3)
HX GLUCOSE LVL: 82 mg/dL (ref 70.0–110.0)
HX POTASSIUM LVL: 4.3 mmol/L (ref 3.6–5.2)
HX SODIUM LVL: 142 mmol/L (ref 136.0–146.0)
HX TOTAL PROTEIN: 7.1 g/dL (ref 6.0–8.4)

## 2017-05-18 LAB — HX .AUTOMATED DIFF
CASE NUMBER: 2018363001663
HX ABSOLUTE BASO COUNT: 0.02 10*3/uL (ref 0.0–0.22)
HX ABSOLUTE EOS COUNT: 0.12 10*3/uL (ref 0.0–0.45)
HX ABSOLUTE LYMPHS COUNT: 1.95 10*3/uL (ref 0.74–5.04)
HX ABSOLUTE MONO COUNT: 0.55 10*3/uL (ref 0.0–1.34)
HX ABSOLUTE NEUTRO COUNT: 2.02 10*3/uL (ref 1.48–7.95)
HX BASOPHILS: 0.4 %
HX EOSINOPHILS: 2.6 %
HX IMMATURE GRANULOCYTES: 0.2 % (ref 0.0–2.0)
HX LYMPHOCYTES: 41.8 %
HX MONOCYTES: 11.8 %
HX NEUTROPHILS: 43.2 %

## 2017-05-18 LAB — HX CBC W/ DIFF
CASE NUMBER: 2018363001663
HX ABSOLUTE NRBC COUNT: 0 10*3/uL
HX HCT: 40.2 % (ref 39.0–53.0)
HX HGB: 13 g/dL (ref 13.0–17.5)
HX MCH: 29.7 pg (ref 26.0–34.0)
HX MCHC: 32.3 g/dL (ref 31.0–37.0)
HX MCV: 92 fL (ref 80.0–100.0)
HX MPV: 10.6 fL (ref 9.4–12.4)
HX NRBC PERCENT: 0 %
HX PLATELET: 244 10*3/uL (ref 150.0–400.0)
HX RBC: 4.37 10*6/uL (ref 4.2–5.9)
HX RDW-CV: 13.1 % (ref 11.5–14.5)
HX RDW-SD: 44.3 fL (ref 35.0–51.0)
HX WBC: 4.7 10*3/uL (ref 4.0–11.0)

## 2017-05-18 LAB — HX TROPONIN I
CASE NUMBER: 2018363001663
HX TROPONIN I: 0.024 ng/mL (ref 0.015–0.045)

## 2017-05-18 LAB — HX GLOMERULAR FILTRATION RATE (ESTIMATED)
CASE NUMBER: 2018363001663
HX AFN AMER GLOMERULAR FILTRATION RATE: >90
HX NON-AFN AMER GLOMERULAR FILTRATION RATE: >90

## 2017-05-18 LAB — HX BLUE TOP TO HOLD: CASE NUMBER: 2018363001663

## 2017-05-18 NOTE — H&P (Signed)
Chief Complaint    Chest pain    History of Present Illness    Pt uninterested in clinical interview, texting all the time during the  interview, and only interested to increase his dilaudid dose as soon as  physician walked into the room, even though his last dose was given 5 mins  ago. Hostile demeanor during interview, and not elaborating on his history. Pt  was reassured that if his current dose of dilaudid where not effective in the  next 10 mins, he should reach the nurse and we could increase his pain  medication until he is pain free. He also was offered stat NTG but he declined  it.        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 He is known to our service and several admission due to persistent  angina pectoris. He has been followed at [facility] and at [facility] where he just left AMA. Today he present with persistent L shoulder  pain radiated to his arm.    Review of Systems    CONSTITUTIONAL: No weight loss, fever, chills, weakness or fatigue.    HEENT: Eyes: No visual loss, blurred vision, double vision or yellow sclerae.  Ears, Nose, Throat: No hearing loss, sneezing, congestion, runny nose or sore  throat.    SKIN: No rash or itching.    CARDIOVASCULAR: Chest pain    RESPIRATORY: No shortness of breath, cough or sputum.    GASTROINTESTINAL: No anorexia, nausea, vomiting or diarrhea. No abdominal pain  or blood.    GENITOURINARY: No Burning on urination.    NEUROLOGICAL: No headache, dizziness, syncope, paralysis, ataxia, numbness or  tingling in the extremities. No change in bowel or bladder control.    MUSCULOSKELETAL: No muscle, back pain, joint pain or stiffness.    HEMATOLOGIC: No anemia, bleeding or bruising.    LYMPHATICS: No enlarged nodes. No history of splenectomy.    PSYCHIATRIC: No history of depression or anxiety.    ENDOCRINOLOGIC: No reports of  sweating, cold or heat intolerance. No polyuria  or polydipsia.    ALLERGIES: No history of asthma, hives, eczema or rhinitis.    Code Status    Full code    Physical Exam    Vitals & Measurements    **T:** 98.6 F (Oral) **HR:** 83 **RR:** 17 **BP:** 134/71 **SpO2:** 98% **O2  Method (L/min):** Room air **WT:** 113 Kg    GENERAL: The patient is a well-developed, well-nourished male in no apparent  distress. He is alert and oriented x3.    HEENT: Head is normocephalic and atraumatic. Extraocular muscles are intact.  Pupils are equal, round, and reactive to light and accommodation. Nares    appeared normal. Mouth is well hydrated and without lesions. Mucous membranes  are moist. Posterior pharynx clear of any exudate or lesions.    NECK: Supple. No carotid bruits. No lymphadenopathy or thyromegaly.    LUNGS: Clear to auscultation.    HEART: Regular rate and rhythm without murmur.    ABDOMEN: Soft, nontender, and nondistended. Positive bowel sounds. No  hepatosplenomegaly was noted.    EXTREMITIES: Without any cyanosis, clubbing, rash, lesions or edema.    SKIN: No ulceration or induration present.    Impression and Plan    #Non anginal Chest pain    ASA given at the ER    Admit to a telemetry  unit    Cycle cardiac troponin    Drug test pending    EKG is reassuring, showing Sinus rhythm    Due to his hx of CAD we will consult cardiology in the morning    Also [facility] cardiologist has suggested that Pt needs a psych evaluation and a  pain care plan    CMS Two-Midnight Rule    .Marland KitchenMarland KitchenObservation        Problem List/Past Medical History    _Ongoing_    No qualifying data    _Historical_    No qualifying data    Procedure/Surgical History    CABG.    Social History    _Alcohol_    Current    _Substance Abuse_ \- Denies Substance Abuse    _Tobacco_    Current, Cigarettes    Family History    Allergies    Motrin    morphine    traMADOL    Toradol    Medications    _Inpatient_    aspirin 325 mg oral tablet, 325 mg= 1 tab(s), PO,  Daily    nitroglycerin, 0.4 mg= 1 tab(s), S L, q28min, PRN    _Home_    aspirin, 81 mg, PO, Daily    atorvastatin 40 mg oral tablet, 40 mg= 1 tab(s), PO, Daily    Metoprolol Tartrate 25 mg oral tablet, 25 mg= 1 tab(s), PO, BID    Diet    Regular Diet - Ordered    -- 05/18/17 19:42:00 EST, Room Service, Scheduled / PRN    Lab Results    Glucose Lvl: 82 mg/dL (16/10/96 04:54:09 EST)    BUN: 11 mg/dL (81/19/14 78:29:56 EST)    Creatinine: 0.781 mg/dL (21/30/86 57:84:69 EST)    Afn Amer Glomerular Filtration Rate: >90 (05/18/17 17:43:00 EST)    Non-Afn Amer Glomerular Filtration Rate: >90 (05/18/17 17:43:00 EST)    Sodium Lvl: 142 mmol/L (05/18/17 17:43:00 EST)    Potassium Lvl: 4.3 mmol/L (05/18/17 17:43:00 EST)    Chloride: 110 mmol/L (05/18/17 17:43:00 EST)    CO2: 24 mmol/L (05/18/17 17:43:00 EST)    Anion Gap: 8 (05/18/17 17:43:00 EST)    Total Protein: 6.8 Gm/dL (62/95/28 41:32:44 EST)    Albumin Lvl: 3.1 Gm/dL Low (05/22/70 53:66:44 EST)    Calcium Lvl: 9 mg/dL (03/47/42 59:56:38 EST)    Bilirubin Total: 0.3 mg/dL (75/64/33 29:51:88 EST)    Bilirubin Direct: 0.1 mg/dL (41/66/06 30:16:01 EST)    Alkaline Phosphatase: 54 Units/L (05/19/17 03:34:00 EST)    AST: 55 Units/L High (05/19/17 03:34:00 EST)    ALT: 114 Units/L High (05/19/17 03:34:00 EST)    Troponin I: 0.021 nGm/ml (05/19/17 03:34:00 EST)    WBC: 4.7 thous/mm3 (05/18/17 17:43:00 EST)    RBC: 4.37 Mil/mm3 (05/18/17 17:43:00 EST)    Hgb: 13 Gm/dL (09/32/35 57:32:20 EST)    Hct: 40.2 % (05/18/17 17:43:00 EST)    Platelet: 244 thous/mm3 (05/18/17 17:43:00 EST)    MCV: 92 fL (05/18/17 17:43:00 EST)    MCH: 29.7 pGm (05/18/17 17:43:00 EST)    MCHC: 32.3 Gm/dL (25/42/70 62:37:62 EST)    RDW-SD: 44.3 fL (05/18/17 17:43:00 EST)    MPV: 10.6 fL (05/18/17 17:43:00 EST)    Absolute Neutro Count: 2.02 thous/mm3 (05/18/17 17:43:00 EST)    Absolute Lymphs Count: 1.95 thous/mm3 (05/18/17 17:43:00 EST)    Absolute Mono Count: 0.55 thous/mm3 (05/18/17 17:43:00 EST)     Absolute Eos Count: 0.12 thous/mm3 (05/18/17 17:43:00 EST)    Absolute Baso Count: 0.02  thous/mm3 (05/18/17 17:43:00 EST)    Neutrophils: 43.2 % (05/18/17 17:43:00 EST)    Lymphocytes: 41.8 % (05/18/17 17:43:00 EST)    Monocytes: 11.8 % (05/18/17 17:43:00 EST)    Eosinophils: 2.6 % (05/18/17 17:43:00 EST)    Basophils: 0.4 % (05/18/17 17:43:00 EST)    Immature Granulocytes: 0.2 % (05/18/17 17:43:00 EST)    NRBC Percent: 0 % (05/18/17 17:43:00 EST)    Absolute NRBC Count: 0 thous/mm3 (05/18/17 17:43:00 EST)    Blue Top Tube To Hold: DONE (05/18/17 17:43:00 EST)    Diagnostic Results        ------        SIGNATURE LINE Electronically signed by Laurice Record MD, Regnia Mathwig on  05/19/2017 at 08:40:08 EST

## 2017-05-18 NOTE — ED Notes (Signed)
Please click on link to see document

## 2017-05-18 NOTE — Discharge Summary (Signed)
Date of Admission    05/18/17    Date of Discharge    05/20/17    Code Status    Code Status - Ordered    -- 05/19/17 8:37:00 EST, Full Resuscitation, Constant Order    Allergies    Motrin    morphine    traMADOL    Toradol    Social History    _Alcohol_    Current    _Substance Abuse_ \- Denies Substance Abuse    _Tobacco_    Current, Cigarettes    Hospital Course    The patient was not encountered today as he decided to leave AMA, before seen  by the physician.    Please refer to the [facility] nursing note for details.    Reviewing the chart his troponins have been negative but noted with  transaminitis.    I did not encounter the pt as he left AMA, hence I cannot provide more  documentation regarding current presentation.    SIGNATURE LINE Electronically signed by Felipa Furnace MD (HOSP), Tesla Keeler on  05/20/2017 at 19:28:45 EST

## 2017-05-18 NOTE — Progress Notes (Signed)
Subjective    pt seen and examined, chart reviewed, he continues with chest pain radiating  to left leg constant not related to excretion, no alleviating factor no  aggravating factor,    Review of Systems    Objective    Vitals & Measurements    **T:** 98.6 F (Oral) **HR:** 55 **RR:** 14 **BP:** 119/73 **SpO2:** 98% **O2  Method (L/min):** Room air **WT:** 113 Kg    Physical Exam    Aox3, NAD    Heent: no jaundice    heart: RRR    lungs: CTA    Abdomen: soft, non tender,    Ext: normal ROM no pain on ROM of left shoulder area, no joint tenderness    neuro: non focal deficit.    Medications    _Inpatient_    aspirin 325 mg oral tablet, 325 mg= 1 tab(s), PO, Daily    Dilaudid, 0.5 mg= 0.5 mL, IV Push, q4hr, PRN    nitroglycerin, 0.4 mg= 1 tab(s), S L, q40min, PRN    Lab Results    Glucose Lvl: 82 mg/dL (29/52/84 13:24:40 EST)    BUN: 11 mg/dL (03/16/24 36:64:40 EST)    Creatinine: 0.781 mg/dL (34/74/25 95:63:87 EST)    Afn Amer Glomerular Filtration Rate: >90 (05/18/17 17:43:00 EST)    Non-Afn Amer Glomerular Filtration Rate: >90 (05/18/17 17:43:00 EST)    Sodium Lvl: 142 mmol/L (05/18/17 17:43:00 EST)    Potassium Lvl: 4.3 mmol/L (05/18/17 17:43:00 EST)    Chloride: 110 mmol/L (05/18/17 17:43:00 EST)    CO2: 24 mmol/L (05/18/17 17:43:00 EST)    Anion Gap: 8 (05/18/17 17:43:00 EST)    Total Protein: 6.8 Gm/dL (56/43/32 95:18:84 EST)    Albumin Lvl: 3.1 Gm/dL Low (16/60/63 01:60:10 EST)    Calcium Lvl: 9 mg/dL (93/23/55 73:22:02 EST)    Bilirubin Total: 0.3 mg/dL (54/27/06 23:76:28 EST)    Bilirubin Direct: 0.1 mg/dL (31/51/76 16:07:37 EST)    Alkaline Phosphatase: 54 Units/L (05/19/17 03:34:00 EST)    AST: 55 Units/L High (05/19/17 03:34:00 EST)    ALT: 114 Units/L High (05/19/17 03:34:00 EST)    Hemoglobin A1c: 5.9 % High (05/18/17 17:43:00 EST)    Est Average Glucose (eAG): 123 mg/dL (10/62/69 48:54:62 EST)    Troponin I: 0.024 nGm/ml (05/19/17 10:01:00 EST)    Chol: 198 mg/dL (70/35/00 93:81:82 EST)    HDL:  37 mg/dL Low (99/37/16 96:78:93 EST)    LDL: 138 mg/dL High (81/01/75 10:25:85 EST)    Trig: 116 mg/dL (27/78/24 23:53:61 EST)    WBC: 4.7 thous/mm3 (05/18/17 17:43:00 EST)    RBC: 4.37 Mil/mm3 (05/18/17 17:43:00 EST)    Hgb: 13 Gm/dL (44/31/54 00:86:76 EST)    Hct: 40.2 % (05/18/17 17:43:00 EST)    Platelet: 244 thous/mm3 (05/18/17 17:43:00 EST)    MCV: 92 fL (05/18/17 17:43:00 EST)    MCH: 29.7 pGm (05/18/17 17:43:00 EST)    MCHC: 32.3 Gm/dL (19/50/93 26:71:24 EST)    RDW-SD: 44.3 fL (05/18/17 17:43:00 EST)    MPV: 10.6 fL (05/18/17 17:43:00 EST)    Absolute Neutro Count: 2.02 thous/mm3 (05/18/17 17:43:00 EST)    Absolute Lymphs Count: 1.95 thous/mm3 (05/18/17 17:43:00 EST)    Absolute Mono Count: 0.55 thous/mm3 (05/18/17 17:43:00 EST)    Absolute Eos Count: 0.12 thous/mm3 (05/18/17 17:43:00 EST)    Absolute Baso Count: 0.02 thous/mm3 (05/18/17 17:43:00 EST)    Neutrophils: 43.2 % (05/18/17 17:43:00 EST)    Lymphocytes: 41.8 % (05/18/17 17:43:00 EST)    Monocytes: 11.8 % (  05/18/17 17:43:00 EST)    Eosinophils: 2.6 % (05/18/17 17:43:00 EST)    Basophils: 0.4 % (05/18/17 17:43:00 EST)    Immature Granulocytes: 0.2 % (05/18/17 17:43:00 EST)    NRBC Percent: 0 % (05/18/17 17:43:00 EST)    Absolute NRBC Count: 0 thous/mm3 (05/18/17 17:43:00 EST)    Blue Top Tube To Hold: DONE (05/18/17 17:43:00 EST)    Diagnostic Results    Impression and Plan        41 year old gentleman with a history of CAD, status post CABG, admitted for  chest pain,            -Chest pain: acute coronary syndrome ruled out negative cardiac enzymes, EKG nonischemic, ? Medication seeking behavior, as patient continues asking for IV pain medication, continue aspirin, statin, had similar episode recently continue pain management    SIGNATURE LINE Electronically signed by Ruby Cola MD, Blessing Zaucha (HOSP) on 05/19/2017  at 17:20:12 EST

## 2017-05-19 LAB — HX HEPATIC FUNCTION PANEL
CASE NUMBER: 2018364000173
HX ALBUMIN LVL: 3.1 g/dL — ABNORMAL LOW (ref 3.2–5.0)
HX ALKALINE PHOSPHATASE: 54 U/L (ref 30.0–117.0)
HX ALT: 114 U/L — ABNORMAL HIGH (ref 6.0–55.0)
HX AST: 55 U/L — ABNORMAL HIGH (ref 6.0–40.0)
HX BILIRUBIN DIRECT: 0.1 mg/dL (ref 0.0–0.3)
HX BILIRUBIN TOTAL: 0.3 mg/dL (ref 0.2–1.2)
HX TOTAL PROTEIN: 6.8 g/dL (ref 6.0–8.4)

## 2017-05-19 LAB — HX TROPONIN I
CASE NUMBER: 2018363001854
CASE NUMBER: 2018364000173
CASE NUMBER: 2018364000174
HX TROPONIN I: 0.026 ng/mL (ref 0.015–0.045)
HX TROPONIN I: 0.021 ng/mL (ref 0.015–0.045)
HX TROPONIN I: 0.024 ng/mL (ref 0.015–0.045)

## 2017-05-19 LAB — HX LIPID PANEL
CASE NUMBER: 2018364000173
HX CHOL: 198 mg/dL
HX HDL: 37 mg/dL — ABNORMAL LOW
HX LDL: 138 mg/dL — ABNORMAL HIGH
HX TRIG: 116 mg/dL

## 2017-05-19 LAB — HX HEMOGLOBIN A1C
CASE NUMBER: 2018363001663
HX EST AVERAGE GLUCOSE (EAG): 123 mg/dL
HX HEMOGLOBIN A1C: 5.9 % — ABNORMAL HIGH
HX LA1C (INTERNAL): 1.7 %
HX P3 PEAK (INTERNAL): 3.6 %
HX P4 PEAK (INTERNAL): 1.3 %
HX TOTAL AREA RANGE (INTERNAL): 2.38 microvolt/sec (ref 1.0–3.5)

## 2017-05-21 ENCOUNTER — Encounter (HOSPITAL_BASED_OUTPATIENT_CLINIC_OR_DEPARTMENT_OTHER): Payer: Self-pay

## 2017-05-21 ENCOUNTER — Emergency Department (HOSPITAL_BASED_OUTPATIENT_CLINIC_OR_DEPARTMENT_OTHER)
Admission: RE | Admit: 2017-05-21 | Disposition: A | Discharge status: Home / Self Care | Payer: Self-pay | Source: Emergency Department | Attending: Emergency Medicine | Admitting: Emergency Medicine

## 2017-05-21 MED ORDER — OXYCODONE HCL 5 MG PO TABS
5.0000 mg | ORAL_TABLET | Freq: Once | ORAL | Status: AC
Start: 2017-05-21 — End: 2017-05-21
  Administered 2017-05-21: 5 mg via ORAL
  Filled 2017-05-21: qty 1

## 2017-05-21 MED ORDER — PENICILLIN V POTASSIUM 500 MG PO TABS: 500 mg | tablet | Freq: Three times a day (TID) | ORAL | 0 refills | 0 days | Status: AC

## 2017-05-21 MED ORDER — PENICILLIN V POTASSIUM 500 MG PO TABS
500.00 mg | ORAL_TABLET | Freq: Three times a day (TID) | ORAL | 0 refills | Status: AC
Start: 2017-05-21 — End: 2017-05-26

## 2017-05-21 MED ORDER — PENICILLIN V POTASSIUM 250 MG PO TABS
500.00 mg | ORAL_TABLET | Freq: Once | ORAL | Status: AC
Start: 2017-05-21 — End: 2017-05-21
  Administered 2017-05-21: 500 mg via ORAL
  Filled 2017-05-21: qty 2

## 2017-05-21 NOTE — Narrator Note (Signed)
Patient Disposition    Patient education for diagnosis, medications, activity, diet and follow-up.  Patient left ED 3:44 PM.  Patient rep received written instructions.  Interpreter to provide instructions: No    Patient belongings with patient: YES    Have all existing LDAs been addressed? N/A    Have all IV infusions been stopped? N/A    Discharged to: Discharged to home

## 2017-05-21 NOTE — ED Triage Note (Signed)
C/o lower right and left upper dental pain x 2 days

## 2017-05-22 ENCOUNTER — Encounter (HOSPITAL_BASED_OUTPATIENT_CLINIC_OR_DEPARTMENT_OTHER): Payer: Self-pay

## 2017-05-22 ENCOUNTER — Emergency Department (HOSPITAL_BASED_OUTPATIENT_CLINIC_OR_DEPARTMENT_OTHER)
Admission: RE | Admit: 2017-05-22 | Disposition: A | Discharge status: Home / Self Care | Payer: Self-pay | Source: Emergency Department | Attending: Emergency Medicine | Admitting: Emergency Medicine

## 2017-05-22 LAB — BMP (EXT)
Anion Gap (EXT): 13 mmol/L (ref 7–17)
BUN (EXT): 15 mg/dL (ref 6–23)
CO2 (EXT): 29 mmol/L (ref 22–31)
CalciumCalcium (EXT): 9.9 mg/dL (ref 8.8–10.7)
Chloride (EXT): 103 mmol/L (ref 98–107)
Creatinine (EXT): 1.13 mg/dL (ref 0.50–1.20)
GFR Estimated (Calc) (EXT): 81 mL/min/{1.73_m2} (ref >59)
Glucose (EXT): 97 mg/dL (ref 70–100)
Potassium (EXT): 4.1 mmol/L (ref 3.4–5.1)
Sodium (EXT): 145 mmol/L (ref 136–145)

## 2017-05-22 LAB — CBC, PLATELET & DIFFERENTIAL
ABSOLUTE BASO COUNT: 0 10*3/uL (ref 0.0–0.1)
ABSOLUTE EOSINOPHIL COUNT: 0.1 10*3/uL (ref 0.0–0.8)
ABSOLUTE IMM GRAN COUNT: 0 10*3/uL (ref 0.00–0.03)
ABSOLUTE LYMPH COUNT: 2.2 10*3/uL (ref 0.6–5.9)
ABSOLUTE MONO COUNT: 0.4 10*3/uL (ref 0.2–1.4)
ABSOLUTE NEUTROPHIL COUNT: 2 10*3/uL (ref 1.6–8.3)
BASOPHIL %: 0.2 % (ref 0.0–1.2)
EOSINOPHIL %: 2.5 % (ref 0.0–7.0)
HEMATOCRIT: 40.7 % (ref 40.1–51.0)
HEMOGLOBIN: 13.1 g/dL — ABNORMAL LOW (ref 13.7–17.5)
IMMATURE GRANULOCYTE %: 0 % (ref 0.0–0.4)
LYMPHOCYTE %: 46.3 % (ref 15.0–54.0)
MEAN CORP HGB CONC: 32.2 g/dL (ref 31.0–37.0)
MEAN CORPUSCULAR HGB: 29.8 pg (ref 26.0–34.0)
MEAN CORPUSCULAR VOL: 92.7 fL (ref 80.0–100.0)
MEAN PLATELET VOLUME: 10.9 fL (ref 8.7–12.5)
MONOCYTE %: 8.2 % (ref 4.0–13.0)
NEUTROPHIL %: 42.8 % (ref 40.0–75.0)
PLATELET COUNT: 259 10*3/uL (ref 150–400)
RBC DISTRIBUTION WIDTH STD DEV: 45.8 fL (ref 35.1–46.3)
RBC DISTRIBUTION WIDTH: 13.5 % (ref 11.5–14.3)
RED BLOOD CELL COUNT: 4.39 M/uL — ABNORMAL LOW (ref 4.60–6.10)
WHITE BLOOD CELL COUNT: 4.8 10*3/uL (ref 4.0–11.0)

## 2017-05-22 LAB — COMPREHENSIVE METABOLIC PANEL
ALANINE AMINOTRANSFERASE: 112 U/L — ABNORMAL HIGH (ref 12–45)
ALBUMIN: 3.7 g/dL (ref 3.4–5.0)
ALKALINE PHOSPHATASE: 63 U/L (ref 45–117)
ANION GAP: 9 mmol/L (ref 5–15)
ASPARTATE AMINOTRANSFERASE: 59 U/L — ABNORMAL HIGH (ref 8–34)
BILIRUBIN TOTAL: 0.2 mg/dL (ref 0.2–1.0)
BUN (UREA NITROGEN): 15 mg/dL (ref 7–18)
CALCIUM: 9 mg/dL (ref 8.5–10.1)
CARBON DIOXIDE: 27 mmol/L (ref 21–32)
CHLORIDE: 104 mmol/L (ref 98–107)
CREATININE: 1 mg/dL (ref 0.7–1.2)
ESTIMATED GLOMERULAR FILT RATE: >60 mL/min (ref >60)
Glucose Random: 96 mg/dL (ref 74–160)
POTASSIUM: 3.8 mmol/L (ref 3.5–5.1)
SODIUM: 140 mmol/L (ref 136–145)
TOTAL PROTEIN: 7.5 g/dL (ref 6.4–8.2)

## 2017-05-22 LAB — XR CHEST 2 VIEWS

## 2017-05-22 LAB — EKG

## 2017-05-22 LAB — TROPONIN I: TROPONIN I: 0.02 ng/mL (ref 0.00–0.04)

## 2017-05-22 MED ORDER — ALUMINUM-MAGNESIUM-SIMETHICONE 200-200-20 MG/5ML PO SUSP
30.0000 mL | Freq: Once | ORAL | Status: DC
Start: 2017-05-22 — End: 2017-05-22
  Filled 2017-05-22: qty 30

## 2017-05-22 MED ORDER — LIDOCAINE VISCOUS 2 % MT SOLN
10.0000 mL | Freq: Once | OROMUCOSAL | Status: DC
Start: 2017-05-22 — End: 2017-05-22
  Filled 2017-05-22: qty 15

## 2017-05-22 NOTE — Narrator Note (Signed)
ED MD and this RN in patient room speaking to him about treatment plan in [facility] ED tonight.   - pt refusing mylanta and lidocaine  - pt telling MD he is not being treated correctly   - pt telling MD that he wants a transfer because he is not getting the treatment he needs  - pt then got on his phone and called a cab to see how much for a ride to [place]

## 2017-05-22 NOTE — Narrator Note (Signed)
Pt to radiology at this time.

## 2017-05-22 NOTE — ED Triage Note (Signed)
Pt complains of 9/10 chest pain going to lt arm and  Jaw . Pain started 1 hour prior at work. Pt reports 3 past heart attacks and 2 bypasses . Took no meds

## 2017-05-22 NOTE — Narrator Note (Signed)
Patient Disposition    Patient education for diagnosis, medications, activity, diet and follow-up.  Patient left ED 3:12 AM.  Patient rep received written instructions.  Interpreter to provide instructions: No    Patient belongings with patient: YES    Have all existing LDAs been addressed? Yes    Have all IV infusions been stopped? Yes    Discharged to: Discharged to home  - pt left ED with no d/c papers

## 2017-05-22 NOTE — Narrator Note (Signed)
This RN re-enters patient room   - pt getting himself dressed  - pt told this RN "I am not staying here" per patient  - pt told this Rn "I am leaving and not signing any kinda paper" per patient

## 2017-05-22 NOTE — ED Provider Notes (Addendum)
I have reviewed the ED nursing notes and prior records. I have reviewed the patient's past medical history/problem list, allergies, social history and medication list. I saw this patient primarily.    HPI:    This [age] year old male presents with chief complaint of chest pain.  Patient states that he was at work, working as a Journalist, newspaper"bouncer" when he began experiencing substernal chest pain.  He denies any associated shortness of breath, diaphoresis or nausea.  Patient states this feels like when he has previously had a heart attack.  Patient states he has not yet followed up with a local physician because his cardiologist left and they never called him back to reschedule.  Patient states that he is using medications he got as refills from when he lived in [place].  Patient was actually seen in the ED yesterday where he presented for a toothache.    ROS: Pertinent positives were reviewed as per the HPI above. All other systems were reviewed and are negative.    Past Medical History/Problem List:  Past Medical History:   Diagnosis Date   . Acute myocardial infarction, unspecified site, episode of care unspecified    . CAD (coronary artery disease)    . Hypercholesteremia    . MI (myocardial infarction) (HCC)    . Mitral valve prolapse      There is no problem list on file for this patient.    Past Surgical History:  Past Surgical History:  2007: CABG W/ARTERIAL GRAFT SINGLE ARTERIAL GRAFT  No date: CABG, MIN INV, 1 ART, 1 VEN  Medications:     Current Outpatient Medications on File Prior to Encounter:  penicillin v potassium (VEETID) 500 MG tablet Take 1 tablet by mouth 3 (three) times daily for 5 days Disp: 15 tablet Rfl: 0   METOPROLOL TARTRATE OR None Entered Disp:  Rfl:    ASPIRIN 81 MG OR TABS daily Disp:  Rfl:    LIPITOR 20 MG OR TABS daily Disp:  Rfl:      Social History:    Socioeconomic History   . Marital status: Single   Tobacco Use   . Smoking status: Current Every Day Smoker   . Smokeless tobacco: Never Used    Allergies:  Review of Patient's Allergies indicates:   Ketorolac trometham*    Hives   Morphine and related    Anaphylaxis   Morphine sulfate-na*        Comment:Pt. States can take dilaudid and other narcotics   Motrin [ibuprofen]      Hives  Physical Exam:  BP 123/71  Pulse 78  Temp 97.9 F (Temporal)  Resp 19  SpO2 97%  GENERAL:  Well-developed, overweight, alert & oriented x 4, no acute distress  SKIN:  Warm & Dry, no rash, no bruising  HEAD:  Atraumatic. PERRL.   ENT: Oropharynx without tonsillar exudates or redness.   NECK:  Neck supple with full ROM.  No lymphadenopathy. No stridor.  LUNGS:  Clear to auscultation bilaterally.   HEART:  RRR.  No murmur.  Midline well-healed sternotomy scar.  ABDOMEN:  Soft, obese, without distension.  Nontender to palpation.   MUSCULOSKELETAL:  No deformities.  Normal pulses.  NEUROLOGIC:  Normal speech. Normal gait and station.   PSYCHIATRIC:  Normal affect    ED Course and Medical Decision-making:  Patient is a [age] year old male who presents with complaints of chest pain which began at rest with a history of premature cardiac disease.  Patient has had previous  coronary artery bypass grafting.  Patient states that both parents had MIs in their 89s.  EKG was obtained upon arrival and shows no acute changes and is unchanged from previous tracings.  Review of patient's record is confusing inasmuch as he states that he had been at [facility] 2 months ago and they have no record of his having been there.  Review of "care everywhere" shows previous hospitalizations in [place] starting in April, October 9 and 22 and November 2.  Each of these hospitalizations ended in him leaving AGAINST MEDICAL ADVICE when advised that he could no longer have IV narcotic medications.  Patient's visits at the [facility] had similar outcomes.  Patient had previously refused cardiac stress test testing but has had negative EKGs, enzymes and echoes.  Review of the [place]  prescription monitoring database reveals 10 prescriptions from 9 different prescribers and 8 different pharmacies for narcotic medications.  Labs Reviewed   CBC, PLATELET & DIFFERENTIAL - Abnormal; Notable for the following components:       Result Value    RED BLOOD CELL COUNT 4.39 (*)     HEMOGLOBIN 13.1 (*)     All other components within normal limits   COMPREHENSIVE METABOLIC PANEL - Abnormal; Notable for the following components:    ASPARTATE AMINOTRANSFERASE 59 (*)     ALANINE AMINOTRANSFERASE 112 (*)     All other components within normal limits   TROPONIN I-negative   Chest x-ray: Within normal limits  Patient had refused GI cocktail.  Patient requested transfer to [facility].  I discussed with patient that we could not transfer him till we knew for sure that he had normal labs and evaluation.  I spent some time discussing with patient his past history of having left each previous hospitalization AGAINST MEDICAL ADVICE.  I stressed the fact that he has multiple risk factors and that he needs outpatient follow-up with a primary care physician and cardiologist.  Patient ultimately left hospital refusing to sign any documents though he was given verbal instructions.    Reasons to return to the ED were reviewed in detail.    Diagnosis/Diagnoses:  Complaints of chest pain.  Drug-seeking behavior    Verna Czech MD    Addendum: Records from [facility] were received after patient had left AMA.  Discharge summary from 11/22 relates that patient signed out AGAINST MEDICAL ADVICE after a partial negative workup.  "In fact patient has had many visits to area hospitals including [facility] on 11/16, [facility] on 11/12, and [facility] on 10/31, where he had a negative nuclear stress test.  Each of those times he left AGAINST MEDICAL ADVICE when denied narcotics.  Additionally he has had 2 admissions to [facility] in the past 2 months both times leaving AMA prior to full workup."

## 2017-05-24 ENCOUNTER — Emergency Department: Admit: 2017-05-24 | Disposition: A | Discharge status: Other Acute Inpatient Hospital | Source: Ambulatory Visit

## 2017-05-24 ENCOUNTER — Ambulatory Visit: Admitting: Cardiovascular Disease

## 2017-05-24 ENCOUNTER — Observation Stay
Admit: 2017-05-24 | Disposition: A | Discharge status: Home / Self Care | Payer: No Typology Code available for payment source

## 2017-05-24 DIAGNOSIS — Z765 Malingerer [conscious simulation]: Secondary | ICD-10-CM

## 2017-05-24 LAB — HX HEM-ROUTINE
HX BASO #: 0 10*3/uL (ref 0.0–0.2)
HX BASO: 0 %
HX EOSIN #: 0.2 10*3/uL (ref 0.0–0.5)
HX EOSIN: 3 %
HX HCT: 37.3 % (ref 37.0–47.0)
HX HGB: 12 g/dL — ABNORMAL LOW (ref 13.5–16.0)
HX IMMATURE GRANULOCYTE#: 0 10*3/uL (ref 0.0–0.1)
HX IMMATURE GRANULOCYTE: 0 %
HX LYMPH #: 2.1 10*3/uL (ref 1.0–4.0)
HX LYMPH: 33 %
HX MCH: 29.6 pg (ref 26.0–34.0)
HX MCHC: 32.2 g/dL (ref 32.0–36.0)
HX MCV: 91.9 fL (ref 80.0–98.0)
HX MONO #: 0.4 10*3/uL (ref 0.2–0.8)
HX MONO: 6 %
HX MPV: 10.9 fL (ref 9.1–11.7)
HX NEUT #: 3.8 10*3/uL (ref 1.5–7.5)
HX NRBC #: 0 10*3/uL
HX NUCLEATED RBC: 0 %
HX PLT: 258 10*3/uL (ref 150–400)
HX RBC BLOOD COUNT: 4.06 M/uL — ABNORMAL LOW (ref 4.20–5.50)
HX RDW: 13.1 % (ref 11.5–14.5)
HX SEG NEUT: 58 %
HX WBC: 6.5 10*3/uL (ref 4.0–11.0)

## 2017-05-24 LAB — HX CHEM-LIPIDS
HX CHOL-HDL RATIO: 4.6
HX CHOLESTEROL: 183 mg/dL (ref 110–199)
HX HIGH DENSITY LIPOPROTEIN CHOL (HDL): 40 mg/dL (ref 35–75)
HX LDL: 108 mg/dL (ref 0–129)
HX TRIGLYCERIDES: 176 mg/dL (ref 40–250)

## 2017-05-24 LAB — HX CHEM-PANELS
HX ANION GAP: 10 (ref 3–14)
HX BLOOD UREA NITROGEN: 15 mg/dL (ref 6–24)
HX CHLORIDE (CL): 106 meq/L (ref 98–110)
HX CO2: 25 meq/L (ref 20–30)
HX CREATININE (CR): 1 mg/dL (ref 0.57–1.30)
HX GFR, AFRICAN AMERICAN: 109 mL/min/{1.73_m2}
HX GFR, NON-AFRICAN AMERICAN: 94 mL/min/{1.73_m2}
HX GLUCOSE: 96 mg/dL (ref 70–139)
HX POTASSIUM (K): 4 meq/L (ref 3.6–5.1)
HX SODIUM (NA): 141 meq/L (ref 135–145)

## 2017-05-24 LAB — HX DIABETES: HX GLUCOSE: 96 mg/dL (ref 70–139)

## 2017-05-24 LAB — HX CHEM-ENZ-FRAC
HX TROPONIN I: <0.01 ng/mL (ref 0.00–0.03)
HX TROPONIN I: <0.01 ng/mL (ref 0.00–0.03)
HX TROPONIN I: <0.01 ng/mL (ref 0.00–0.03)

## 2017-05-24 LAB — HX CHOLESTEROL
HX CHOLESTEROL: 183 mg/dL (ref 110–199)
HX HIGH DENSITY LIPOPROTEIN CHOL (HDL): 40 mg/dL (ref 35–75)
HX LDL: 108 mg/dL (ref 0–129)
HX TRIGLYCERIDES: 176 mg/dL (ref 40–250)

## 2017-05-24 NOTE — ED Provider Notes (Signed)
.  .  [name], [name]  MRN: [id number]  Age: 42 yrs  Sex: Male  DOB: 02-20-1976  Arrival Date: 05/24/2017  Arrival Time: 01:39  Account#: [id number]  Bed Pending Adult  PCP: No Pcp, Per, PATIENT  Chief Complaint: Chest Pain > 32 y/o,  - light headed  .  Presentation:  01/04  01:40 Presenting complaint: EMS states: EMS called to a local hotel   jm42        lobby for a male complaining of sudden onset chest pain 9/10        radiating to his LEFT arm. 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 Pt given        Aspirin 324 mg PO by EMS. EMS unable to obtain IV access so no        other medications administered. EKG completed by EMS was        unremarkable. Pt denies drug use tonight.  01:40 Method Of Arrival: EMS: [facility]                              jm42  01:40 Acuity: Adult 2                                                 jm42  .  Historical:  - Allergies:  01:43 Tramadol HCl;                                                   jm42  01:43 Motrin;                                                         jm42  01:43 Toradol;                                                        jm42  01:43 Codeine;                                                        jm42  - Home Meds:  01:43 Aspirin Oral [Active]; Lipitor Oral [Active]; Metoprolol        jm42        Tartrate Oral [Active];  - PMHx:  01:43 CAD; High Cholesterol; Hypertension; mi x3;                     jm42  - PSHx:  01:43 Cholecystectomy; CABG;  jm42  .  - Social history: Smoking status: Preferred Language: English.  .  .  Screening:  01:43 SEPSIS SCREENING - Temp > 38.3 or < 36.0 No - Heart Rate > 90   jm42        No - Respiratory > 20 No - SBP < 90 No SIRS Criteria (> = 2)        No. Safety sceening deferred Privacy compromised. Fall Risk        None identified.  02:46 Safety screen: Patient feels safe. Suicide Screening: Patient   rk10        denies thoughts of harm.  Nutritional screening: No deficits        noted. Tuberculosis screening: No symptoms or risk factors        identified. Exposure Risk/Travel Screening: None identified.  .  Vital Signs:  01:43 BP 135 / 82; Pulse 81; Resp 14; Temp 37.2(O); Pulse Ox 96% on   jm42        R/A; Pain 9/10;  .  [name]  [id number]  [id number]  Page 1 of 4  %%PAGE  .  [name], [name]  MRN: [id number]  Age: 14 yrs  Sex: Male  DOB: [birth year]  Arrival Date: 05/24/2017  Arrival Time: 01:39  Account#: [id number]  Bed Pending Adult  PCP: No Pcp, Per, PATIENT  Chief Complaint: Chest Pain > 32 y/o,  - light headed  .  02:39 BP 105 / 51; Pulse 82; Resp 20; Pulse Ox 96% ;                  rk10  03:00 BP 110 / 54; Pulse 83; Resp 16; Pulse Ox 97% ;                  rk10  03:15 Pain 7/10;                                                      rk10  04:16 BP 100 / 60; Pulse 63; Resp 16; Pulse Ox 100% ;                 rk10  06:46 BP 111 / 58; Pulse 62; Resp 19; Pulse Ox 96% on R/A;            rk10  .  Neuro Vital Signs:  01:43 GCS: 15,                                                        jm42  .  Triage Assessment:  01:43 General: Appears in no apparent distress, comfortable, well     jm42        nourished, well groomed, Behavior is appropriate for age. Pain:        Complains of pain in chest Pain currently is 9 out of 10 on a        pain scale. Neuro: No deficits noted. Cardiovascular: Capillary        refill < 3 seconds. Respiratory: Airway is patent Respiratory        effort is even, unlabored, Respiratory pattern is regular,  symmetrical. GI: Abdomen is flat, non- distended. Skin: Skin is        intact, is healthy with good turgor, Skin is pink, warm / dry.  .  Assessment:  02:46 General: Appears in no apparent distress, well nourished,       rk10        Behavior is cooperative. Pain: Complains of pain in chest.        Neuro: No deficits noted. Cardiovascular: Reports shortness of        breath Rhythm is regular.  Respiratory: Airway is patent        Respiratory effort is even, unlabored, Breath sounds are clear        bilaterally. Skin: No deficits noted.  .  Observations:  01:39 Patient arrived in ED.                                          jm42  01:42 Triage Completed.                                               jm42  01:42 Patient Visited By: Lorine Bears                               jcp1  02:15 Registration completed.                                         dl13  02:15 Patient Visited By: Elray Buba                            [phone]  02:26 Patient assigned to A4                                          sk12  02:39 Appears to be sleeping.                                         rk10  02:45 Patient Visited By: Earlie Server  04:16 Patient Visited By: Jolayne Panther                               [phone]  06:46 Patient Visited By: Jolayne Panther                               [phone]  08:07 Patient assigned to A4  jm64  08:25 Patient assigned to A4                                          jm64  .  Procedure:  .  [name], [name]  [id number]  [id number]  Page 2 of 4  %%PAGE  .  [name], [name]  MRN: [id number]  Age: 33 yrs  Sex: Male  DOB: Nov 28, 1975  Arrival Date: 05/24/2017  Arrival Time: 01:39  Account#: [id number]  Bed Pending Adult  PCP: No Pcp, Per, PATIENT  Chief Complaint: Chest Pain > 32 y/o,  - light headed  .  01:51 EKG done. (by ED staff). Old EKG Obtained Reviewed By: Jill Alexanders   sm50        Pop[place] DO.  02:46 Inserted peripheral IV: 22 gauge in right forearm.              rk10  .  Dispensed Medications:  02:35 Drug: Nitrostat 0.4 mg Route: Sublingual;                       rk10  02:35 Drug: fentaNYL (PF) 25 mcg Route: IV;                           rk10  03:15 Follow up: Pain 7/10 Adult                                      rk10  02:46 Drug: NS - Sodium Chloride 500 mL Route: IV; Rate: Bolus;       rk10  04:55 Follow up: IV  Status: Completed infusion; IV Intake:      rk10  .  [name]  Intake:  04:55 IV: 500.70ml; Total: 500.47ml.                                  rk10  .  Interventions:  01:51 Cardiac monitor on. Pulse ox on. NIBP on.                       sm50  01:52 ECG/EKG Scanned into Chart                                      jm49  02:11 EMS Sheet Scanned into Chart                                    sp22  03:37 Demo Sheet Scanned into Chart                                   jm49  09:11 Patient Belongings Scanned into Chart                           tl10  .  Outcome:  02:19 Decision to Hospitalize by Provider.  jcp1  07:58 Hospitalize undone.                                             jcp1  07:59 Decision to Hospitalize by Provider.                            jcp1  09:56 Admitted to [place] [facility] accompanied by tech, via stretcher, with    cp13        chart. Condition: stable. Chart Status Nursing note complete        and electronically signed.  09:56 Patient left the ED.                                            cp13  .  Signatures:  Valarie Merino                          RN   sk12  Shellia Cleverly                         RN   jm42  Lasandra Beech                        BSN  cp13  [address] [place]                             tl10  Ellard Artis                        Sec  [address] [place]                             dl13  Jolayne Panther                           RN   rk10  Arelia Sneddon                           CCT  sm50  [place], [place]                           DO   jcp1  Kris Hartmann                              jm64  .  [name], [name]  [id number]  [id number]  Page 3 of 4  %%PAGE  .  [name], [name]  MRN: [id number]  Age: 67 yrs  Sex: Male  DOB: 09-Nov-1975  Arrival Date: 05/24/2017  Arrival Time: 01:39  Account#: [id number]  Bed Pending Adult  PCP: No Pcp, Per, PATIENT  Chief Complaint: Chest Pain > 32 y/o,  - light headed  .  [name]                           Sec   sp22  .  .  .  .  .  .  .  .  .  .  .  .  .  .  .  .  .  .  .  .  .  .  .  .  .  .  .  .  .  .  .  .  .  .  .  .  .  .  .  .  .  [name]  [name], [name]  [id number]  [id number]  Page 4 of 4  .  %%END

## 2017-05-24 NOTE — Progress Notes (Signed)
Division of Cardiology  Patient Name:    [name]  Medical Record   [id number]  #:  Date of Birth:   10-29-1975  Visit Date:      05/24/2017  .  [name]  [name], M.D.  Cardiology Division, [facility]  [place], [place] [address]  .  RE:  [name], [name]  Date of Visit:  05/24/17  .  Dear [name]:  .  I am dictating this letter to summarize the hospitalization of [name] in anticipation of outpatient follow-up.  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  The  anomalous coronary artery was discovered when the patient presented in 2007  with a chest pain syndrome and later underwent coronary artery bypass graft  surgery in which the right internal mammary artery was sewn into the right  coronary artery.  The patient continued to have chest discomfort and was  evaluated some years later in [facility] where the RIMA graft was  found to be atretic.  The patient then underwent successful cardiac surgery  including reimplantation of the coronary artery on the right.  Unfortunately, the patient has chronic and recurring chest pain.  The  patient has visited numerous emergency rooms and he presented to the [facility] Emergency Room with symptoms of chest heaviness. He report  allergies or intolerances to non-opioid medications and has fairly specific  requests for pain control medications.  Troponin biomarkers and  electrocardiogram were unremarkable.  To further clarify the likelihood that the chest pain syndrome may be  secondary to cardiac ischemia or coronary dysfunction the patient underwent  a cardiac CT scan.  The CT scan showed no evidence of coronary  atherosclerosis with a coronary calcium score of zero.  The right coronary  artery ostium was patent at the reimplantation site  and no coronary artery  stenosis was identified.  Grossly normal left and right ventricular  systolic function was identified.  The CT scan also demonstrated areas of  adenopathy as well as pulmonary mosaic attenuation, which is a nonspecific  finding.  The pulmonary artery was found to be dilated and suggestive of  pulmonary hypertension.  [name]  Upon receiving the result of the CT scan and negative cardiac biomarker  levels, we discharged the patient home with a recommendation for outpatient  followup of the adenopathy and pulmonary findings.  The patient requested  an oral narcotic medication on discharge for pain management.  Survey of  the [facility] program found that he had received numerous prescriptions for  narcotics from different prescribers recently.  We elected to provide no  further narcotic prescription.  .  Thank you very much for the opportunity to care for [name]. [name].  .  .  .  Sincerely,  .  .  [name] Brightly, MD, Cardiology Attending  Dictated by:  [name] Brightly, MD  .  Electronically Signed  [name] Brightly, MD 05/28/2017 08:18 A  .  .  .  cc:

## 2017-05-24 NOTE — ED Provider Notes (Signed)
I have reviewed the ED nursing notes and prior records. I have reviewed the patient's past medical history/problem list, allergies, social history and medication list.     HPI:  This 42 year old male patient presents with chief complaint of facial/dental pain for 2 days pain to multiple teeth, atraumatic.  No fever.  Pain is aching.  Onset gradual.  Not relieved by tylenol. Pt has hx CAD s/p CABG, no neck/chest pain, no sob.  Pain Score: Data Unavailable    Review of Systems:  Constitutional:   No fevers, no chills  Cardiovascular:   No chest pain, no palpitations  Respiratory:  No SOB, no wheeze  Gastrointestinal: No nausea, no vomiting, no diarrhea  Skin:   No rash, no pruritis    Pertinent positives were reviewed as per the HPI above. All other systems were reviewed and are negative.    Past Medical History/Problem List:  Past Medical History:   Diagnosis Date   . Acute myocardial infarction, unspecified site, episode of care unspecified    . CAD (coronary artery disease)    . Hypercholesteremia    . MI (myocardial infarction) (HCC)    . Mitral valve prolapse      There is no problem list on file for this patient.      Past Surgical History:  Past Surgical History:  2007: CABG W/ARTERIAL GRAFT SINGLE ARTERIAL GRAFT  No date: CABG, MIN INV, 1 ART, 1 VEN    Medications:     No current facility-administered medications for this encounter.   Current Outpatient Medications:  penicillin v potassium (VEETID) 500 MG tablet Take 1 tablet by mouth 3 (three) times daily for 5 days Disp: 15 tablet Rfl: 0   METOPROLOL TARTRATE OR None Entered Disp:  Rfl:    ASPIRIN 81 MG OR TABS daily Disp:  Rfl:    LIPITOR 20 MG OR TABS daily Disp:  Rfl:        Social History:  Social History    Tobacco Use      Smoking status: Current Every Day Smoker      Smokeless tobacco: Never Used    Alcohol use: Not on file      Allergies:  Review of Patient's Allergies indicates:   Ketorolac trometham*    Hives   Morphine and related    Anaphylaxis    Morphine sulfate-na*        Comment:Pt. States can take dilaudid and other narcotics   Motrin [ibuprofen]      Hives    Physical Exam:  ED Triage Vitals [05/21/17 1444]   Enc Vitals Group      BP 138/67      Pulse 94      Resp 18      Temp 98.1 F      Temp src       SpO2 98 %      Weight 111.1 kg (245 lb)      Height       Head Circumference       Peak Flow       Pain Score       Pain Loc       Pain Edu?       Excl. in GC?      GENERAL:  Well-appearing, non-toxic  SKIN:  Warm & Dry, no rash, no bruising.  HEAD:  Atraumatic. PERRL. Anicteric sclera.   ENT:  Oropharynx clear, no dental abscess, floor of mouth normal, uvula midline, managing  secretions, s/p multiple dental extractions, multiple carious molars noted  NECK:  Supple, FROM, no midline tenderness  LUNGS:  Clear to auscultation bilaterally without rales, rhonchi or wheezing.   HEART:  RRR.  No murmurs, rubs, or gallops.   ABDOMEN:  Soft, flat, without distension.  Nontender to palpation.   MUSCULOSKELETAL:  No deformities. Well-perfused extremities. No cyanosis or edema.  No midline spinal tenderness.  GENITOURINARY:  No CVA tenderness.   NEUROLOGIC:  Normal speech.  alert & oriented x 3, strength/sensation intact all extremities, able to ambulate  PSYCHIATRIC:  Normal affect, calm & cooperative    ED Course and Medical Decision-making:  Pertinent Labs & Imaging studies reviewed.  ED nursing record was reviewed. Prior records as available electronically through the Epic record were reviewed.     05/21/17  1444   BP: 138/67   Pulse: 94   Resp: 18   Temp: 98.1 F   SpO2: 98%   Weight: 111.1 kg (245 lb)       O2 Sat interpretation:  WNL    OSH records reviewed, pt with hx evaluations for chest pain, signing out AMA after not given narcotic analgesics.    Pt presents with c/o dental/facial pain attributed to specific, carious molars. Do not suspect acs despite pt history.  No fluctuance to suggest dental abscess, Ludwig's angina or PTA.  Pt advised to continue  tylenol for pain, may also use oragel, will prescribe short course abx and refer to dental for f/u.     Reasons to seek care discussed and pt expresses understanding.       Medication List      START taking these medications    penicillin v potassium 500 MG tablet  Commonly known as:  VEETID  Take 1 tablet by mouth 3 (three) times daily for 5 days        ASK your doctor about these medications    aspirin 81 MG tablet     LIPITOR 20 MG tablet  Generic drug:  atorvastatin     METOPROLOL TARTRATE PO           Where to Get Your Medications      You can get these medications from any pharmacy    Bring a paper prescription for each of these medications   penicillin v potassium 500 MG tablet           Condition on ED departure:  Stable  PCP:  NON STAFF PROVIDER    Diagnosis/Diagnoses:  Pain, dental  (primary encounter diagnosis)    Marlena Clipper, MD  Attending Physician, [place]  Clinical Instructor, [employer]  Director of Emergency Informatics, [employer]

## 2017-05-24 NOTE — ED Provider Notes (Signed)
[name]  Name: [name], [name]  MRN: [id number]  Age: 42 yrs  Sex: Male  DOB: 11/07/1975  Arrival Date: 05/24/2017  Arrival Time: 01:39  Account#: [id number]  .  Working Diagnosis: Chest pain, unspecified  PCP: No Pcp, Per, PATIENT  .  Historical:  - Allergies: Tramadol HCl; Motrin; Toradol; Codeine;  - Home Meds: Aspirin Oral; Lipitor Oral; Metoprolol Tartrate Oral;  - PMHx: CAD; High Cholesterol; Hypertension; mi x3;  - PSHx: Cholecystectomy; CABG;  - Social history: Smoking status: Preferred Language: English.  .  .  Vital Signs:  01/04  01:43 BP 135 / 82; Pulse 81; Resp 14; Temp 37.2(O); Pulse Ox 96% on   jm42        R/A; Pain 9/10;  02:39 BP 105 / 51; Pulse 82; Resp 20; Pulse Ox 96% ;                  rk10  03:00 BP 110 / 54; Pulse 83; Resp 16; Pulse Ox 97% ;                  rk10  03:15 Pain 7/10;                                                      rk10  04:16 BP 100 / 60; Pulse 63; Resp 16; Pulse Ox 100% ;                 rk10  06:46 BP 111 / 58; Pulse 62; Resp 19; Pulse Ox 96% on R/A;            rk10  .  Neuro Vital Signs:  01:43 GCS: 15,                                                        jm42  .  MDM:  .  01/04  01:48 Order name: Wyvonnia Dusky; Complete Time: 02:12             dispa  t  01/04  01:48 Order name: EDIE_CAREPLAN; Complete Time: 02:12                 dispa  t  01/04  02:13 Order name: BUN (Blood Urea Nitrogen); Complete Time: 03:11     jcp1  01/04  02:13 Order name: CBC/Diff (With Plt); Complete Time: 03:11           jcp1  01/04  02:13 Order name: CR (Creatinine); Complete Time: 03:11               jcp1  01/04  02:13 Order name: GLU (Glucose); Complete Time: 03:11                 jcp1  01/04  02:13 Order name: LYTES (Na, K, Cl, Co2); Complete Time: 03:11        jcp1  01/04  02:13 Order name: Troponin I; Complete Time: 03:11                    jcp1  01/04  02:13 Order name: CXR (PA/Lat); Complete Time: 04:12  jcp1  01/04  .  [name],  [name]  [id number]  [id number]  Page 1 of 4  %%PAGE  .  [name], [name]  MRN: [id number]  Age: 62 yrs  Sex: Male  DOB: 07-06-1975  Arrival Date: 05/24/2017  Arrival Time: 01:39  Account#: [id number]  .  Working Diagnosis: Chest pain, unspecified  PCP: No Pcp, Per, PATIENT  .  02:56 Order name: GFR, AA; Complete Time: 03:11                       dispa  t  01/04  02:56 Order name: GFR, NAA; Complete Time: 03:11                      dispa  t  01/04  05:41 Order name: Troponin I; Complete Time: 06:48                    rk10  01/04  02:13 Order name: Adult EKG (order using folder); Complete Time: 02:13jcp1  01/04  02:13 Order name: Cardiac Monitor; Complete Time: 02:24               jcp1  01/04  02:13 Order name: EKG (order using folder); Complete Time: 02:24      jcp1  01/04  02:13 Order name: Pulse Oximetry Continuous; Complete Time: 02:24     jcp1  01/04  02:19 Order name: Refer to ED-OBS; Complete Time: 02:24               jcp1  .  Dispensed Medications:  02:35 Drug: Nitrostat 0.4 mg Route: Sublingual;                       rk10  02:35 Drug: fentaNYL (PF) 25 mcg Route: IV;                           rk10  03:15 Follow up: Pain 7/10 Adult                                      rk10  02:46 Drug: NS - Sodium Chloride 500 mL Route: IV; Rate: Bolus;       rk10  04:55 Follow up: IV Status: Completed infusion; IV Intake:      rk10  .  [name]  Radiology Orders:  Order Name: CXR (PA/Lat); Last Status: Reviewed; Time: 05/24/17    02:13; By: jcp1; For: jcp1; Order Method: Electronic; Notes:    Bed Name: A4  Attending Notes:  02:15 Attending HPI: Social History: Nonsmoker Lives at home HPI:     jcp21        42 year old male with past medical history significant for CAD        status post MI and CABG, hyperlipidemia who presents with 30        minutes of left-sided chest pain with radiation into the left        arm and left neck consistent with previous MIs. Pain has been        constant since onset. Denies any nausea or  vomiting,        diaphoresis, shortness of breath. Received aspirin by EMS.        Attending ROS My personal review of system reveals        Constitutional: No  fevers or chills Neck: No neck pain ENT: No        ear pain Cardiovascular: Admits to chest pain Respiratory: No        shortness of breath Gastrointestinal: No abdominal pain, no        nausea or vomiting Back: No back pain MS/Extremity: No pain        Skin: No rashes Neuro: No headache. Attending Exam: My personal  .  [name], [name]  [id number]  [id number]  Page 2 of 4  %%PAGE  .  [name], [name]  MRN: [id number]  Age: 29 yrs  Sex: Male  DOB: 1975/08/19  Arrival Date: 05/24/2017  Arrival Time: 01:39  Account#: [id number]  .  Working Diagnosis: Chest pain, unspecified  PCP: No Pcp, Per, PATIENT  .        exam reveals Constitutional: Well-nourished, well-developed,        appears stated age Eyes: No conjunctival injection, symmetrical        eyelids Neck: Symmetric, trachea midline, no thyromegaly        Cardiovascular: Normal rate and rhythm, +S1/S2, no        murmurs/rubs/gallops , 2+ radial pulses bilaterally        Respiratory: No respiratory distress, clear to auscultation        bilaterally, no wheezes/rhonchi/rales Gastrointestinal: Soft,        non-tender, non-distended Musculoskeletal: normocephalic,        atraumatic, extremities without deformity or tenderness to        palpation Neurological: CNs II-XII grossly intact, motor and        sensation grossly intact Skin: Warm, dry, no rashes or lesion        Psych: Awake, alert and oriented. Appropriate mood and effect.        HEART score History- Suspicion: High (2 pts) EKG- ST segments        Normal ST segments (0 pt) Age < or = 45 (0 pts) Risk factors        1-2 (1 pt) Troponin < upper limit normal (0 pts) Total Score 3=        Score 0-3 with 0.9-1.7% MACE 30 days.  02:18 Lab/Ancillary show: EKG interpreted by me and shows: I have     jcp1        reviewed EKG shows normal sinus  rhythm with rate 79, no ST-T        wave abnormalities.  10:07 Lab/Ancillary show: Labs were reviewed and interpreted by me:   jcp1        Troponin negative x2 Xray(s) visualized and interpreted by me        CXR shows: No acute abnormalities. ED Course: 43 year old with        significant cardiac history including myocardial infarctions        and CABG in the past. Cardiac workup reveals negative troponins        x2, no dynamic changes on EKG. Patient's pain improved with        aspirin, nitroglycerin, small dose of sentinel. Will admit to        cardiology for further evaluation. My Working Impression: 1.        Chest pain. Attending chart complete and electronically signed:        Lorine Bears, DO.  .  ED Observation:  02:20 REFER PATIENT TO ED OBSERVATION STATUS. The patient was         jcp1  informed of the need for further observational care. Diagnosis:        Chest Pain. Reason for ED Observation: At the time of referral        to ED Observation status, a more precise diagnosis is needed        and further observation and testing is required for diagnostic        accuracy. TREATMENT PLAN: EKG Monitoring Serial Cardiac        Enzymes. Family history Pertinent for heart disease. Progress        Note: 42 year old male with significant risk factors including        CAD status post MI presents with chest pain with radiation to        the left arm and neck/jaw. EKG nondiagnostic. Received aspirin.  [name]  [name], [name]  [id number]  [id number]  Page 3 of 4  %%PAGE  .  [name], [name]  MRN: [id number]  Age: 69 yrs  Sex: Male  DOB: [birth year]  Arrival Date: 05/24/2017  Arrival Time: 01:39  Account#: [id number]  .  Working Diagnosis: Chest pain, unspecified  PCP: No Pcp, Per, PATIENT  .  Disposition Summary:  05/24/17 07:59  Hospitalization Ordered        Hospitalization Status: Observation(05/24/17 07:59)             jcp1        Provider: Enis Gash, Gordon(05/24/17 07:59)                        jcp1        Clinical Setting: Adult Floor(05/24/17 07:59)                   jcp1        Condition: Stable(05/24/17 07:59)                               jcp1        Problem: new(05/24/17 07:59)                                    jcp1        Symptoms: have improved(05/24/17 07:59)                         jcp1        Bed/Room Type: Regular(05/24/17 07:59)                          jcp1        Room Assignment: [facility] 6(05/24/17 08:25)                        jm64        Preliminary Diagnosis          - Chest pain, unspecified(05/24/17 07:59)                     jcp1        Forms:          - Handoff Communication Form                                  jcp1  Signatures:  Dispatcher,  [employer]                          dispa  Valarie Merino                          RN   sk12  Shellia Cleverly                         RN   jm42  [address], [place]                             dl13  Jolayne Panther                           RN   rk10  Popso, Jill Alexanders                           DO   jcp1  Lorraine Lax  .  Corrections: (The following items were deleted from the chart)  02:26 02:19 jcp1                                                      sk12  07:58 02:19 Observation jcp1                                          jcp1  07:58 02:19 Popso, Justin jcp1                                        jcp1  07:58 02:19 ED Obsv jcp1                                              jcp1  07:58 02:19 Stable jcp1                                               jcp1  07:58 02:19 new jcp1                                                  jcp1  07:58 02:19 are unchanged jcp1                                        jcp1  07:58 02:19 Regular jcp1  jcp1  07:58 02:19 Chest pain, unspecified jcp1                              jcp1  07:58 02:26 ED Obs sk12                                               jcp1  08:07 07:59 jcp1                                                      jm64  08:25 08:07  *PENDING BED* jm64                                        jm64  10:09 02:15 HEART score History- Suspicion: High (2 pts) EKG- ST      jcp1        segments Normal ST segments (0 pt) Age < or = 45 (0 pts) Risk        factors 1-2 (1 pt) jcp1  .  Document is preliminary until electronically or manually signed by the atte  nding physician  .  Name:Kotarski, Lennex  [id number]  [id number]  Page 4 of 4  .  %%END

## 2017-05-26 LAB — BMP (EXT)
Anion Gap (EXT): 11 mmol/L (ref 3–17)
BUN (EXT): 15 mg/dL (ref 8–25)
CO2 (EXT): 25 mmol/L (ref 23–32)
CalciumCalcium (EXT): 9.8 mg/dL (ref 8.5–10.5)
Chloride (EXT): 104 mmol/L (ref 98–108)
Creatinine (EXT): 0.92 mg/dL (ref 0.60–1.50)
GFR Estimated (Calc) (EXT): 104 mL/min/{1.73_m2} (ref >59)
Glucose (EXT): 100 mg/dL (ref 70–110)
Potassium (EXT): 5.7 mmol/L — ABNORMAL HIGH (ref 3.4–5.0)
Sodium (EXT): 140 mmol/L (ref 135–145)

## 2017-05-27 DIAGNOSIS — B1710 Acute hepatitis C without hepatic coma: Secondary | ICD-10-CM | POA: Insufficient documentation

## 2017-05-27 LAB — HEMOGLOBIN A1C
Estimated Average Glucose mg/dL (INT/EXT): 134.1 mg/dL — ABNORMAL HIGH (ref 85.0–126.0)
HEMOGLOBIN A1C % (INT/EXT): 6.3 % — ABNORMAL HIGH (ref 4.6–6.0)

## 2017-05-27 LAB — LIPID PROFILE (EXT)
Cholesterol (EXT): 208 mg/dL — ABNORMAL HIGH (ref 0–199)
HDL Cholesterol (EXT): 41.4 mg/dL (ref >=40.0)
LDL Cholesterol, CALC (EXT): 138 mg/dL — ABNORMAL HIGH (ref 0–100)
Risk Factor (EXT): 5 (ref 0.0–5.0)
Triglycerides (EXT): 142 mg/dL (ref 10–200)

## 2017-05-27 LAB — HEPATITIS C ANTIBODY CARE EVERYWHERE: HEPATITIS C ANTIBODY CARE EVERYWHERE: REACTIVE — AB

## 2017-05-27 LAB — HEPATITIS B SURFACE ANTIBODY CARE EVERYWHERE: HEPATITIS B SURFACE ANTIBODY CARE EVERYWHERE: NONREACTIVE — NL

## 2017-05-27 LAB — HEPATITIS A ANTIBODY TOTAL CARE EVERYWHERE: HEPATITIS A ANTIBODY TOTAL CARE EVERYWHERE: NONREACTIVE — NL

## 2017-05-27 LAB — HEMOGLOBIN A1C CARE EVERYWHERE
ESTIMATED AVERAGE GLUCOSE CARE EVERYWHERE: 134.1 mg/dL — ABNORMAL HIGH (ref 85.0–126.0)
HEMOGLOBIN A1C CARE EVERYWHERE: 6.3 % — ABNORMAL HIGH (ref 4.6–6.0)

## 2017-05-27 LAB — HEPATITIS B SURFACE ANTIGEN CARE EVERYWHERE: HEPATITIS B SURFACE ANTIGEN CARE EVERYWHERE: NONREACTIVE — NL

## 2017-06-02 LAB — LIPID PROFILE (EXT)
Cholesterol (EXT): 168 mg/dL (ref 0–199)
HDL Cholesterol (EXT): 42.1 mg/dL (ref >=40.0)
LDL Cholesterol, CALC (EXT): 91 mg/dL (ref 0–100)
Risk Factor (EXT): 4 (ref 0.0–5.0)
Triglycerides (EXT): 173 mg/dL (ref 10–200)

## 2017-06-02 LAB — PROTHROMBIN TIME CARE EVERYWHERE
INR CARE EVERYWHERE: 1.04 — ABNORMAL LOW (ref 1.50–4.00)
PROTHROMBIN TIME CARE EVERYWHERE: 13.3 s — NL (ref 11.5–14.3)

## 2017-06-02 LAB — APTT CARE EVERYWHERE: APTT CARE EVERYWHERE: 28.9 s — NL (ref 22.3–34.6)

## 2017-06-18 NOTE — Progress Notes (Signed)
Return to sender .

## 2017-06-29 DIAGNOSIS — F1911 Other psychoactive substance abuse, in remission: Secondary | ICD-10-CM | POA: Insufficient documentation

## 2017-06-29 DIAGNOSIS — E782 Mixed hyperlipidemia: Secondary | ICD-10-CM | POA: Insufficient documentation

## 2017-06-29 LAB — LIPID PROFILE (EXT)
Cholesterol (EXT): 234 mg/dL — ABNORMAL HIGH (ref 0–199)
HDL Cholesterol (EXT): 39 mg/dL — ABNORMAL LOW (ref >=40.0)
LDL Cholesterol, CALC (EXT): 164 mg/dL — ABNORMAL HIGH (ref 0–100)
Risk Factor (EXT): 6 — ABNORMAL HIGH (ref 0.0–5.0)
Triglycerides (EXT): 153 mg/dL (ref 10–200)

## 2017-06-29 LAB — PROTHROMBIN TIME CARE EVERYWHERE
INR CARE EVERYWHERE: 1.03 — ABNORMAL LOW (ref 1.50–4.00)
PROTHROMBIN TIME CARE EVERYWHERE: 13.5 s — NL (ref 11.5–14.3)

## 2017-07-01 ENCOUNTER — Ambulatory Visit: Attending: Internal Medicine | Primary: Family Medicine

## 2017-07-02 NOTE — Telephone Encounter (Signed)
I attempted to contact patient today to discuss no shows; however, there was no answer.  No show letter sent.  Message left to call to reschedule and address policy.    Please address no show policy with patient when they call back and document the conversation in the chart.

## 2017-07-04 LAB — APTT CARE EVERYWHERE: APTT CARE EVERYWHERE: 26.9 s — NL (ref 22.3–34.6)

## 2017-07-04 LAB — PROTHROMBIN TIME CARE EVERYWHERE
INR CARE EVERYWHERE: 0.99 — ABNORMAL LOW (ref 1.50–4.00)
PROTHROMBIN TIME CARE EVERYWHERE: 13.1 s — NL (ref 11.5–14.3)

## 2017-07-20 LAB — BMP (EXT)
Anion Gap (EXT): 9 mmol/L (ref 3–17)
BUN (EXT): 16 mg/dL (ref 8–25)
CO2 (EXT): 26 mmol/L (ref 23–32)
CalciumCalcium (EXT): 9 mg/dL (ref 8.5–10.5)
Chloride (EXT): 104 mmol/L (ref 98–108)
Creatinine (EXT): 0.86 mg/dL (ref 0.60–1.50)
GFR Estimated (Calc) (EXT): 109 mL/min/{1.73_m2} (ref >59)
Glucose (EXT): 131 mg/dL — ABNORMAL HIGH (ref 70–110)
Potassium (EXT): 4 mmol/L (ref 3.4–5.0)
Sodium (EXT): 139 mmol/L (ref 135–145)

## 2017-07-20 LAB — PROTHROMBIN TIME CARE EVERYWHERE
INR CARE EVERYWHERE: 1 — NL (ref 0.9–1.1)
PROTHROMBIN TIME CARE EVERYWHERE: 12.7 s — NL (ref 11.5–14.5)

## 2017-08-21 ENCOUNTER — Emergency Department
Admit: 2017-08-21 | Discharge status: Against Medical Advice | Source: Home / Self Care | Attending: Emergency Medicine | Admitting: Emergency Medicine

## 2017-08-21 LAB — HX CBC W/ DIFF
CASE NUMBER: 2019093001749
HX ABSOLUTE NRBC COUNT: 0 10*3/uL
HX HCT: 41.9 % (ref 39.0–53.0)
HX HGB: 13.8 g/dL (ref 13.0–17.5)
HX MCH: 30.3 pg (ref 26.0–34.0)
HX MCHC: 32.9 g/dL (ref 31.0–37.0)
HX MCV: 92.1 fL (ref 80.0–100.0)
HX MPV: 10.1 fL (ref 9.4–12.4)
HX NRBC PERCENT: 0 %
HX PLATELET: 231 10*3/uL (ref 150.0–400.0)
HX RBC: 4.55 10*6/uL (ref 4.2–5.9)
HX RDW-CV: 14.1 % (ref 11.5–14.5)
HX RDW-SD: 47.8 fL (ref 35.0–51.0)
HX WBC: 6 10*3/uL (ref 4.0–11.0)

## 2017-08-21 LAB — HX .AUTOMATED DIFF
CASE NUMBER: 2019093001749
HX ABSOLUTE BASO COUNT: 0.02 10*3/uL (ref 0.0–0.22)
HX ABSOLUTE EOS COUNT: 0.14 10*3/uL (ref 0.0–0.45)
HX ABSOLUTE LYMPHS COUNT: 1.94 10*3/uL (ref 0.74–5.04)
HX ABSOLUTE MONO COUNT: 0.42 10*3/uL (ref 0.0–1.34)
HX ABSOLUTE NEUTRO COUNT: 3.41 10*3/uL (ref 1.48–7.95)
HX BASOPHILS: 0.3 %
HX EOSINOPHILS: 2.4 %
HX IMMATURE GRANULOCYTES: 0.3 % (ref 0.0–2.0)
HX LYMPHOCYTES: 32.6 %
HX MONOCYTES: 7.1 %
HX NEUTROPHILS: 57.3 %

## 2017-08-21 LAB — HX NT-PROBNP
CASE NUMBER: 2019093001749
HX NT-PROBNP: 38 pg/mL (ref 0.0–450.0)

## 2017-08-21 LAB — HX SST GOLD TUBE TO HOLD: CASE NUMBER: 2019093001749

## 2017-08-21 LAB — HX BLUE TOP TO HOLD: CASE NUMBER: 2019093001749

## 2017-08-21 LAB — HX COMPREHENSIVE METABOLIC PANEL
CASE NUMBER: 2019093001749
HX ALBUMIN LVL: 3.6 g/dL (ref 3.2–5.0)
HX ALKALINE PHOSPHATASE: 57 U/L (ref 30.0–117.0)
HX ALT: 62 U/L — ABNORMAL HIGH (ref 6.0–55.0)
HX ANION GAP: 7 (ref 3.0–11.0)
HX AST: 35 U/L (ref 6.0–40.0)
HX BILIRUBIN TOTAL: 0.4 mg/dL (ref 0.2–1.2)
HX BUN: 11 mg/dL (ref 6.0–20.0)
HX CALCIUM LVL: 8.6 mg/dL (ref 8.5–10.5)
HX CHLORIDE: 107 mmol/L (ref 98.0–110.0)
HX CO2: 26 mmol/L (ref 21.0–32.0)
HX CREATININE: 0.906 mg/dL (ref 0.55–1.3)
HX GLUCOSE LVL: 105 mg/dL (ref 70.0–110.0)
HX POTASSIUM LVL: 3.8 mmol/L (ref 3.6–5.2)
HX SODIUM LVL: 140 mmol/L (ref 136.0–146.0)
HX TOTAL PROTEIN: 7.6 g/dL (ref 6.0–8.4)

## 2017-08-21 LAB — HX TROPONIN I
CASE NUMBER: 2019093001749
HX TROPONIN I: 0.02 ng/mL (ref 0.015–0.045)

## 2017-08-21 LAB — HX GLOMERULAR FILTRATION RATE (ESTIMATED)
CASE NUMBER: 2019093001749
HX AFN AMER GLOMERULAR FILTRATION RATE: >90
HX NON-AFN AMER GLOMERULAR FILTRATION RATE: >90

## 2017-08-21 NOTE — Consults (Signed)
__________________________________________________________________________    CONSULTATION  DATE:  08/21/2017    REFERRING PHYSICIAN:  Serina Cowper, M.D.     REASON FOR CONSULTATION:  Chest pain.    HISTORY OF PRESENT ILLNESS:  This is a 41 year old male with a history of an  anomalous right coronary artery who presents to the hospital with chest pain  that developed acutely while waiting for the bus in [place].  The pain is  persistent and not alter by initial narcotic nor nitrate.  His EKG is normal  with pain.  He complains of significant pain for which he is asking for  treatment.  He himself looks fairly comfortable and nondiaphoretic.  He had been  hospitalized for pain which he describes as similar, but similar, but not  identical back in October and December.  He was evaluated and treated three  times at this hospital.  He has received treatment at multiple other hospitals.   I was able to obtain medical records from [facility] in 2012.  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  Initially he had  a RIMA placed to the native right coronary artery that became atretic.  At [facility] in 2012, the right coronary artery was dissected out and  reimplanted anteriorly.  Subsequent to that, he tells me he has not been seen by  a cardiologist.  He has not seen a physician in quite a while.  He does refill  Toprol and atorvastatin through a primary care physician.  He has been bouncing  around a little bit.  He was living in [place].  He was in [facility] for a few  weeks, not long ago.  He has been living in [place] some.  He was in a motel  in two [place] this morning.  He was here over the weekend without chest  symptoms.  He does smoke cigarettes, three a day.  He denies illicit substance  abuse.  He awoke without pain.  While waiting for the bus, he stood up and he  had developed a sharp  pain.  He says it was squeezing and sharp pain that  radiated to the left neck and left arm.  The pain was persistent.  It was  nonpleuritic.  It was not reproducible.  It has not changed at all since  inception.  Troponins is bland at 0.02.  I note that he had troponins of 0.02  without trend in December.    The patient had presented to our Emergency Room on 03/02/2017 with chest pain  and was admitted overnight.  He had 10/10 chest pain that was somewhat  refractory, nonischemic.  He was readmitted 03/11/2017 and 05/19/2017.  He has  not been here for several months.  This is his first time back in the area this  weekend.    He denies illicit substance abuse.  He did have a negative urine tox in October.    PAST MEDICAL HISTORY:  1.  Anomalous right coronary artery and two cardiac surgeries in the past to  address this issue.  The first one was RIMA to RCA.  The second involved  reimplantation of the native right coronary artery.  2.  Tobacco abuse.    PAST SURGICAL HISTORY:  Cholecystectomy.    MEDICATIONS:  1.  Toprol, question dose.  2.  Atorvastatin 40.  3.  Aspirin.  ALLERGIES:  He is listed as having allergies to Motrin, morphine, Tramadol and  Toradol.    SOCIAL HISTORY:  As above.    FAMILY HISTORY:  Premature coronary disease.    REVIEW OF SYSTEMS:  Remainder reviewed and negative.  He has not had infective  symptoms.  He was in his usual state of health this morning.  No physical  injury.    PHYSICAL EXAMINATION:  GENERAL:  No distress.  He was on his phone on my  arrival.  He looks comfortable.  He is now stating he has severe pain.  He is  nondiaphoretic.  VITAL SIGNS:  Blood pressure 130.  Pulse 70.  Afebrile.  Pulse  oximetry 97% on room air.  HEENT:  Unremarkable.  NECK:  Carotids 2+, without  bruits.  CVP normal.  HEART:  Regular rate and rhythm.  No murmur.  No S3.   Stable sternotomy.  CHEST:  Lungs clear.  ABDOMEN:  Soft.  EXTREMITIES:  No edema.  Intact distal  pulses.    LABORATORY DATA:   BUN 11.  Creatinine 0.7.  Sodium 142.  Potassium 4.3.  White  count 5.  Hematocrit 40.  Platelets 244.    CARDIOLOGY DATA:  EKG is normal with pain, normal in the field.    RADIOLOGY DATA:  His chest x-ray is a suggestion of cardiomegaly.    IMPRESSION AND PLAN:  A 42 year old man who has undergone heart surgery for an  anomalous right coronary artery in the past who presents to the hospital with  chest pain.  His chest pain is inconsistent with ischemia.  His EKGs are normal.   His troponin is normal to indeterminate.    RECOMMENDATIONS:  I reassured him that I did not believe his presenting symptom  to be ischemic.  He is a smoker, so he is at risk for conventional acute  coronary syndrome.  His coronary disease historically was not atherosclerotic  per se, but rather anomalous right coronary artery.  I would repeat his troponin  in the early afternoon.  Assuming it is not changed. I would discharge him home  on medication off cigarettes with primary care physician followup.    Dictated by:  Mohammed Kindle, M.D.    D:  08/21/2017 13:33:20  T:  08/21/2017 13:49:46  E:  08/21/2017 13:49:46  OA/par  Job# [id number]   SIGNATURE LINE    Electronically signed by Karie Mainland MD, Pasty Arch on 08/22/2017 at 12:33:17 EST

## 2017-08-21 NOTE — ED Notes (Signed)
Please click on link to see document

## 2017-08-22 ENCOUNTER — Ambulatory Visit

## 2017-08-23 LAB — PROTHROMBIN TIME CARE EVERYWHERE: INR CARE EVERYWHERE: 1 — NL (ref 0.9–1.1)

## 2017-08-23 LAB — APTT CARE EVERYWHERE: APTT CARE EVERYWHERE: 27 s — NL (ref 22–36)

## 2017-09-01 LAB — PROTHROMBIN TIME CARE EVERYWHERE: INR CARE EVERYWHERE: 1.1 — NL (ref <1.3)

## 2017-09-02 LAB — UNMAPPED LAB RESULTS

## 2017-09-12 LAB — CMP (EXT)
A/G Ratio (EXT): 1.2
ALT/SGPT (EXT): 67 U/L — ABNORMAL HIGH (ref 0–40)
AST/SGOT (EXT): 42 U/L — ABNORMAL HIGH (ref 0–37)
Albumin (EXT): 4.1 g/dL (ref 3.2–5.2)
Alkaline Phosphatase (EXT): 53 U/L (ref 39–117)
Anion Gap (EXT): 10 meq/L (ref 7–16)
BUN (EXT): 12 mg/dL (ref 6–19)
BUN/CREAT Ratio (EXT): 11.9
Bilirubin, Total (EXT): <0.2 mg/dL (ref 0.0–1.0)
CO2 (EXT): 27 meq/L (ref 21–30)
CalciumCalcium (EXT): 9.6 mg/dL (ref 8.6–10.4)
Chloride (EXT): 102 meq/L (ref 96–108)
Creatinine (EXT): 1.01 mg/dL (ref 0.50–1.30)
Globulin (EXT): 3.4 g/dL (ref 2.0–3.5)
Glucose (EXT): 85 mg/dL (ref 70–99)
Potassium (EXT): 3.8 meq/L (ref 3.3–5.3)
Protein (EXT): 7.5 g/dL (ref 5.9–8.4)
Sodium (EXT): 139 meq/L (ref 133–145)
eGFR - Creat MDRD (EXT): >60 (ref >60)
eGFR - Creat MDRD (EXT): >60 (ref >60)

## 2017-09-19 DIAGNOSIS — Z98818 Other dental procedure status: Secondary | ICD-10-CM

## 2017-09-19 NOTE — ED Triage Notes (Signed)
Dental extraction wisdom tooth right lower 2 weeks ago at [facility] in [place]. Still currently taking amoxicillin TID that he was started on post surgery. Patient was taking percocet for pain but ran out last week. States increased pain. Denies fevers/chills.

## 2017-09-19 NOTE — ED Provider Notes (Signed)
42 year old male presents to the emergency department complaining of right lower posterior dental pain.  He reports approximately 1 week ago had his wisdom tooth removed and has run out of pain medications.  Reports he is still experiencing intermittent pain from the area.  Reports has tried Tylenol at home but pain is not controlled.  He denies any swelling, drainage, difficulty breathing or swallowing.    The history is provided by the patient.        Past Medical History:   Diagnosis Date   ??? CAD (coronary artery disease)     MI x 3   ??? Cardiac revascularization with aortocoronary bypass anastomosis    ??? Chronic dental pain     - multiple teeth remove   ??? Chronic knee pain    ??? Hypertension    ??? Myocardial infarct [facility])    ??? S/P CABG x 1 2007    CABG x 1, 2007, due to congenital heart disease--patient's explanation is that right coronary artery was never in the proper location (perfused left side of heart only) and he had repeat cabage surgery in 2012 to correct this.       Past Surgical History:   Procedure Laterality Date   ??? HX CHOLECYSTECTOMY  08/2010        ??? HX CORONARY ARTERY BYPASS GRAFT  2007, 2012    2007: due to congenital heart disease - patients explanation is that right coronary artery was never in the proper location (perfused left side of heart only) and had repeat CABG surgery in 2012 to correct this   ??? HX OTHER SURGICAL  11/30/2013    TOOTH EXTRACTION         Family History:   Problem Relation Age of Onset   ??? Heart Disease Mother    ??? Cancer Mother         BRAIN CANCER   ??? Diabetes Father    ??? Diabetes Brother    ??? Heart Disease Maternal Grandmother    ??? Dementia Maternal Grandmother    ??? Heart Disease Maternal Grandfather    ??? Heart Disease Paternal Grandmother    ??? Heart Disease Paternal Grandfather    ??? No Known Problems Other    ??? Heart Disease Brother        Social History     Socioeconomic History   ??? Marital status: SINGLE     Spouse name: Not on file   ??? Number of children: 0    ??? Years of education: Not on file   ??? Highest education level: Not on file   Occupational History     Comment: DISABILITY   Social Needs   ??? Financial resource strain: Not on file   ??? Food insecurity:     Worry: Not on file     Inability: Not on file   ??? Transportation needs:     Medical: Not on file     Non-medical: Not on file   Tobacco Use   ??? Smoking status: Current Some Day Smoker     Types: Cigarettes   ??? Smokeless tobacco: Former Neurosurgeon   ??? Tobacco comment: About 40-50 pack years, used to smoke 2 PPD but now down to 2 cigarettes a day   Substance and Sexual Activity   ??? Alcohol use: No   ??? Drug use: No   ??? Sexual activity: Not on file   Lifestyle   ??? Physical activity:     Days per week: Not  on file     Minutes per session: Not on file   ??? Stress: Not on file   Relationships   ??? Social connections:     Talks on phone: Not on file     Gets together: Not on file     Attends religious service: Not on file     Active member of club or organization: Not on file     Attends meetings of clubs or organizations: Not on file     Relationship status: Not on file   ??? Intimate partner violence:     Fear of current or ex partner: Not on file     Emotionally abused: Not on file     Physically abused: Not on file     Forced sexual activity: Not on file   Other Topics Concern   ??? Not on file   Social History Narrative    He is from [place] but moved to [place] at age 39.  Has been between [place] and [address]    Lives with wife, married since 2016     No children    Disability secondary to heart disease and syncopal episode    About 40-50 pack years, used to smoke 2 PPD but now down to 2 cigarettes a day    No EtOH    No drug use         ALLERGIES: Tramadol; Codeine; Morphine; Motrin [ibuprofen]; and Toradol [ketorolac]    Review of Systems   Constitutional: Negative.    HENT: Positive for dental problem.    Respiratory: Negative.    Cardiovascular: Negative.    Gastrointestinal: Negative.    Skin: Negative.     Neurological: Negative.        Vitals:    09/19/17 2223   BP: 140/83   SpO2: 98%            Physical Exam   Nursing note and vitals reviewed.     GENERAL: well-developed, well-nourished, in no distress, calm and cooperative  HEAD: atraumatic, normocephalic  NEURO: Alert and oriented x3, GCS 15, motor and sensory grossly intact  NECK: supple, no adenopathy, full ROM, non-tender  OROPHARYNX:   Small open area that appears to be well healing to the right posterior gingival space of wisdom tooth.  No swelling, fluctuance, or drainage. No abscess or trismus per  LUNGS: clear to auscultation bilaterally, no wheezes, rales or rhonchi; respirations even and unlabored  HEART: regular rate and rhythm; no murmurs or rubs, immediate capillary refill, palpable strong bilateral radial pulses  ABDOMEN: Soft, nontender, nondistended          MDM  Number of Diagnoses or Management Options  Pain, dental:   Diagnosis management comments:   Patient is afebrile, very well-appearing, and in no distress. No trismus, abscess, or suggestion of emergent or significant deep space or soft tissue infection at this time.  Encouraged to closely follow up with his dentist.   The patient appears stable for discharge and verbalizes understanding of home care, followup with dental provider, and ED return precautions.               Procedures  NIH Stroke Scale

## 2017-09-19 NOTE — ED Notes (Signed)
Patient is alert, oriented, calm, and cooperative at time of discharge.  Pt is able to ambulate with a steady gait independently.  Medications, discharge instructions, and follow-up care discussed/reviewed with patient. Understanding is verbalized at this time.  Vital signs are stable at time of discharge -- out of range numbers discussed with care provider and patient is encouraged to follow up with PCP regarding concerns discussed.  RN, patient, provider, and family in agreement with discharge plan. No safety concerns at this time.

## 2017-09-20 ENCOUNTER — Inpatient Hospital Stay
Admit: 2017-09-20 | Discharge: 2017-09-20 | Disposition: A | Discharge status: Home / Self Care | Payer: PRIVATE HEALTH INSURANCE | Attending: Family

## 2017-09-20 MED ORDER — HYDROCODONE-ACETAMINOPHEN 5 MG-325 MG TAB
5-325 mg | ORAL | Status: DC
Start: 2017-09-20 — End: 2017-09-20

## 2017-09-20 MED FILL — HYDROCODONE-ACETAMINOPHEN 5 MG-325 MG TAB: 5-325 mg | ORAL | Qty: 1

## 2017-10-09 ENCOUNTER — Ambulatory Visit: Admitting: Internal Medicine

## 2017-10-09 NOTE — ED Notes (Signed)
Please click on link to see document

## 2017-10-10 ENCOUNTER — Emergency Department
Admit: 2017-10-10 | Disposition: A | Discharge status: Home / Self Care | Source: Home / Self Care | Attending: Nurse Practitioner | Admitting: Nurse Practitioner

## 2017-10-10 LAB — HX COMPREHENSIVE METABOLIC PANEL
CASE NUMBER: 2019142003585
HX ALBUMIN LVL: 3.4 g/dL (ref 3.2–5.0)
HX ALKALINE PHOSPHATASE: 55 U/L (ref 30.0–117.0)
HX ALT: 99 U/L — ABNORMAL HIGH (ref 6.0–55.0)
HX ANION GAP: 6 (ref 3.0–11.0)
HX AST: 46 U/L — ABNORMAL HIGH (ref 6.0–40.0)
HX BILIRUBIN TOTAL: 0.3 mg/dL (ref 0.2–1.2)
HX BUN: 11 mg/dL (ref 6.0–20.0)
HX CALCIUM LVL: 8.8 mg/dL (ref 8.5–10.5)
HX CHLORIDE: 107 mmol/L (ref 98.0–110.0)
HX CO2: 28 mmol/L (ref 21.0–32.0)
HX CREATININE: 1.15 mg/dL (ref 0.55–1.3)
HX GLUCOSE LVL: 132 mg/dL — ABNORMAL HIGH (ref 70.0–110.0)
HX POTASSIUM LVL: 3.5 mmol/L — ABNORMAL LOW (ref 3.6–5.2)
HX SODIUM LVL: 141 mmol/L (ref 136.0–146.0)
HX TOTAL PROTEIN: 7.1 g/dL (ref 6.0–8.4)

## 2017-10-10 LAB — HX URINE FENTANYL
CASE NUMBER: 2019142003602
HX U FENTANYL SCRN: NEGATIVE
HX U PH FOR DAU: 6 (ref 4.5–8.0)

## 2017-10-10 LAB — HX URINE DIPSTICK W/REFLEX
CASE NUMBER: 2019142003602
HX UA BILIRUBIN: NEGATIVE
HX UA BLOOD: NEGATIVE
HX UA GLUCOSE: NEGATIVE
HX UA KETONES: NEGATIVE
HX UA LEUKOCYTE ESTERASE: 25 WBC/uL — AB
HX UA NITRITE: NEGATIVE
HX UA PH: 6 (ref 5.0–8.0)
HX UA PROTEIN: NEGATIVE
HX UA RBC: <1 (ref 0.0–2.0)
HX UA SPECIFIC GRAVITY: 1.019 (ref 1.003–1.03)
HX UA SQUAMOUS EPITHELIAL: 1 /HPF (ref 0.0–5.0)
HX UA UROBILINOGEN: NEGATIVE
HX UA WBC: 4 /HPF (ref 0.0–5.0)

## 2017-10-10 LAB — HX LIPASE LEVEL
CASE NUMBER: 2019142003585
HX LIPASE LVL: 94 U/L (ref 73.0–393.0)

## 2017-10-10 LAB — HX CBC W/ DIFF
CASE NUMBER: 2019142003585
CASE NUMBER: 2019143000396
HX ABSOLUTE NRBC COUNT: 0 10*3/uL
HX ABSOLUTE NRBC COUNT: 0 10*3/uL
HX HCT: 39.6 % (ref 39.0–53.0)
HX HCT: 41.6 % (ref 39.0–53.0)
HX HGB: 13 g/dL (ref 13.0–17.5)
HX HGB: 13.8 g/dL (ref 13.0–17.5)
HX MCH: 30.7 pg (ref 26.0–34.0)
HX MCH: 30.9 pg (ref 26.0–34.0)
HX MCHC: 32.8 g/dL (ref 31.0–37.0)
HX MCHC: 33.2 g/dL (ref 31.0–37.0)
HX MCV: 93.6 fL (ref 80.0–100.0)
HX MCV: 93.1 fL (ref 80.0–100.0)
HX MPV: 10.3 fL (ref 9.4–12.4)
HX MPV: 10.2 fL (ref 9.4–12.4)
HX NRBC PERCENT: 0 %
HX NRBC PERCENT: 0 %
HX PLATELET: 209 10*3/uL (ref 150.0–400.0)
HX PLATELET: 203 10*3/uL (ref 150.0–400.0)
HX RBC: 4.23 10*6/uL (ref 4.2–5.9)
HX RBC: 4.47 10*6/uL (ref 4.2–5.9)
HX RDW-CV: 13.2 % (ref 11.5–14.5)
HX RDW-CV: 13.2 % (ref 11.5–14.5)
HX RDW-SD: 45.2 fL (ref 35.0–51.0)
HX RDW-SD: 45 fL (ref 35.0–51.0)
HX WBC: 6.3 10*3/uL (ref 4.0–11.0)
HX WBC: 5.5 10*3/uL (ref 4.0–11.0)

## 2017-10-10 LAB — HX .AUTOMATED DIFF
CASE NUMBER: 2019142003585
CASE NUMBER: 2019143000396
HX ABSOLUTE BASO COUNT: 0.02 10*3/uL (ref 0.0–0.22)
HX ABSOLUTE BASO COUNT: 0.02 10*3/uL (ref 0.0–0.22)
HX ABSOLUTE EOS COUNT: 0.09 10*3/uL (ref 0.0–0.45)
HX ABSOLUTE EOS COUNT: 0.1 10*3/uL (ref 0.0–0.45)
HX ABSOLUTE LYMPHS COUNT: 2.08 10*3/uL (ref 0.74–5.04)
HX ABSOLUTE LYMPHS COUNT: 2.13 10*3/uL (ref 0.74–5.04)
HX ABSOLUTE MONO COUNT: 0.33 10*3/uL (ref 0.0–1.34)
HX ABSOLUTE MONO COUNT: 0.45 10*3/uL (ref 0.0–1.34)
HX ABSOLUTE NEUTRO COUNT: 3.79 10*3/uL (ref 1.48–7.95)
HX ABSOLUTE NEUTRO COUNT: 2.76 10*3/uL (ref 1.48–7.95)
HX BASOPHILS: 0.3 %
HX BASOPHILS: 0.4 %
HX EOSINOPHILS: 1.4 %
HX EOSINOPHILS: 1.8 %
HX IMMATURE GRANULOCYTES: 0.3 % (ref 0.0–2.0)
HX IMMATURE GRANULOCYTES: 0.5 % (ref 0.0–2.0)
HX LYMPHOCYTES: 32.9 %
HX LYMPHOCYTES: 38.8 %
HX MONOCYTES: 5.2 %
HX MONOCYTES: 8.2 %
HX NEUTROPHILS: 59.9 %
HX NEUTROPHILS: 50.3 %

## 2017-10-10 LAB — HX TROPONIN I
CASE NUMBER: 2019142003585
CASE NUMBER: 2019143000207
CASE NUMBER: 2019143000246
CASE NUMBER: 2019143001152
HX TROPONIN I: <0.015 (ref 0.015–0.045)
HX TROPONIN I: 0.015 ng/mL (ref 0.015–0.045)
HX TROPONIN I: <0.015 (ref 0.015–0.045)
HX TROPONIN I: <0.015 (ref 0.015–0.045)

## 2017-10-10 LAB — HX DRUGS OF ABUSE URINE 9
CASE NUMBER: 2019142003602
HX U AMPHETAMINES SCRN: NEGATIVE
HX U BARBITURATE SCRN: NEGATIVE
HX U BENZODIAZEPINE SCRN: NEGATIVE
HX U CANNABINOID SCRN: NEGATIVE
HX U COCAINE SCRN: NEGATIVE
HX U ETHANOL INTERP: NEGATIVE
HX U ETHANOL: <3 (ref 0.0–49.0)
HX U METHADONE SCRN: NEGATIVE
HX U OPIATE 300 SCRN: NEGATIVE
HX U PH FOR DAU: 6 (ref 4.5–8.0)
HX U PHENCYCLIDINE SCRN: NEGATIVE

## 2017-10-10 LAB — HX C-REACTIVE PROTEIN (CRP)
CASE NUMBER: 2019142003585
HX C-REACTIVE PROTEIN: 0.55 mg/dL (ref 0.0–0.8)

## 2017-10-10 LAB — HX GLOMERULAR FILTRATION RATE (ESTIMATED)
CASE NUMBER: 2019142003585
CASE NUMBER: 2019143000396
HX AFN AMER GLOMERULAR FILTRATION RATE: >90
HX AFN AMER GLOMERULAR FILTRATION RATE: >90
HX NON-AFN AMER GLOMERULAR FILTRATION RATE: 80 mL/min/{1.73_m2}
HX NON-AFN AMER GLOMERULAR FILTRATION RATE: >90

## 2017-10-10 LAB — HX BASIC METABOLIC PANEL
CASE NUMBER: 2019143000396
HX ANION GAP: 6 (ref 3.0–11.0)
HX BUN: 11 mg/dL (ref 6.0–20.0)
HX CALCIUM LVL: 9 mg/dL (ref 8.5–10.5)
HX CHLORIDE: 108 mmol/L (ref 98.0–110.0)
HX CO2: 28 mmol/L (ref 21.0–32.0)
HX CREATININE: 0.839 mg/dL (ref 0.55–1.3)
HX GLUCOSE LVL: 83 mg/dL (ref 70.0–110.0)
HX POTASSIUM LVL: 3.9 mmol/L (ref 3.6–5.2)
HX SODIUM LVL: 142 mmol/L (ref 136.0–146.0)

## 2017-10-10 LAB — HX BLUE TOP TO HOLD: CASE NUMBER: 2019142003596

## 2017-10-10 LAB — HX SST GOLD TUBE TO HOLD: CASE NUMBER: 2019142003596

## 2017-10-10 LAB — HX CREATINE KINASE
CASE NUMBER: 2019142003585
HX TOTAL CK: 246 U/L — ABNORMAL HIGH (ref 44.0–196.0)

## 2017-10-10 NOTE — ED Notes (Signed)
Please click on link to see document

## 2017-10-11 ENCOUNTER — Ambulatory Visit: Admitting: Internal Medicine

## 2017-10-11 LAB — HX CBC W/ DIFF
CASE NUMBER: 2019144000245
HX ABSOLUTE NRBC COUNT: 0 10*3/uL
HX HCT: 41 % (ref 39.0–53.0)
HX HGB: 13.3 g/dL (ref 13.0–17.5)
HX MCH: 30.6 pg (ref 26.0–34.0)
HX MCHC: 32.4 g/dL (ref 31.0–37.0)
HX MCV: 94.3 fL (ref 80.0–100.0)
HX MPV: 10.3 fL (ref 9.4–12.4)
HX NRBC PERCENT: 0 %
HX PLATELET: 220 10*3/uL (ref 150.0–400.0)
HX RBC: 4.35 10*6/uL (ref 4.2–5.9)
HX RDW-CV: 13.2 % (ref 11.5–14.5)
HX RDW-SD: 46.2 fL (ref 35.0–51.0)
HX WBC: 6.7 10*3/uL (ref 4.0–11.0)

## 2017-10-11 LAB — HX COMPREHENSIVE METABOLIC PANEL
CASE NUMBER: 2019144000245
HX ALBUMIN LVL: 3.6 g/dL (ref 3.2–5.0)
HX ALKALINE PHOSPHATASE: 62 U/L (ref 30.0–117.0)
HX ALT: 96 U/L — ABNORMAL HIGH (ref 6.0–55.0)
HX ANION GAP: 5 (ref 3.0–11.0)
HX AST: 44 U/L — ABNORMAL HIGH (ref 6.0–40.0)
HX BILIRUBIN TOTAL: 0.3 mg/dL (ref 0.2–1.2)
HX BUN: 14 mg/dL (ref 6.0–20.0)
HX CALCIUM LVL: 9.6 mg/dL (ref 8.5–10.5)
HX CHLORIDE: 107 mmol/L (ref 98.0–110.0)
HX CO2: 29 mmol/L (ref 21.0–32.0)
HX CREATININE: 1.13 mg/dL (ref 0.55–1.3)
HX GLUCOSE LVL: 128 mg/dL — ABNORMAL HIGH (ref 70.0–110.0)
HX POTASSIUM LVL: 3.7 mmol/L (ref 3.6–5.2)
HX SODIUM LVL: 141 mmol/L (ref 136.0–146.0)
HX TOTAL PROTEIN: 7.6 g/dL (ref 6.0–8.4)

## 2017-10-11 LAB — HX GLOMERULAR FILTRATION RATE (ESTIMATED)
CASE NUMBER: 2019144000245
HX AFN AMER GLOMERULAR FILTRATION RATE: >90
HX NON-AFN AMER GLOMERULAR FILTRATION RATE: 81 mL/min/{1.73_m2}

## 2017-10-11 LAB — HX .AUTOMATED DIFF
CASE NUMBER: 2019144000245
HX ABSOLUTE BASO COUNT: 0.01 10*3/uL (ref 0.0–0.22)
HX ABSOLUTE EOS COUNT: 0.08 10*3/uL (ref 0.0–0.45)
HX ABSOLUTE LYMPHS COUNT: 2.1 10*3/uL (ref 0.74–5.04)
HX ABSOLUTE MONO COUNT: 0.41 10*3/uL (ref 0.0–1.34)
HX ABSOLUTE NEUTRO COUNT: 4.04 10*3/uL (ref 1.48–7.95)
HX BASOPHILS: 0.1 %
HX EOSINOPHILS: 1.2 %
HX IMMATURE GRANULOCYTES: 0.4 % (ref 0.0–2.0)
HX LYMPHOCYTES: 31.5 %
HX MONOCYTES: 6.1 %
HX NEUTROPHILS: 60.7 %

## 2017-10-11 LAB — HX LAVENDER TOP TO HOLD: CASE NUMBER: 2019144000765

## 2017-10-11 LAB — HX TROPONIN I
CASE NUMBER: 2019144000245
CASE NUMBER: 2019144000659
HX TROPONIN I: <0.015 (ref 0.015–0.045)
HX TROPONIN I: <0.015 (ref 0.015–0.045)

## 2017-10-11 LAB — HX BLUE TOP TO HOLD: CASE NUMBER: 2019144000245

## 2017-10-11 LAB — HX URINE CULTURE
CASE NUMBER: 2019143000548
HX F: NO GROWTH

## 2017-10-11 LAB — HX SST GOLD TUBE TO HOLD: CASE NUMBER: 2019144000245

## 2017-10-11 NOTE — ED Notes (Signed)
Please click on link to see document

## 2017-10-11 NOTE — H&P (Signed)
The patient later left AMA. He said he is directly going to his cardiologist's  office.    SIGNATURE LINE Electronically signed by Redmond Baseman MD on 10/11/2017 at  10:25:26 EST

## 2017-10-11 NOTE — H&P (Signed)
Chief Complaint    chest pain    History of Present Illness    The patient is a 42 year old gentleman with a past medical history significant  for coronary artery disease status post CABG who presented to the emergency  room with complaints of chest pain. He says that he was in the emergency room  yesterday because of a dental pain. While in the waiting room he experienced  sudden onset chest pain on the left side of his chest which was radiating to  his left arm and jaw. The patient says that he is planned for stress test on  Monday with his cardiologist at [facility].    Of note the patient has had multiple hospitalizations and has a history of  leaving AGAINST MEDICAL ADVICE when he is not given dilaudid.    When I saw the patient in the emergency room he was upset that he was not  being given his dilaudid.    He did not appear to be in pain and he was pacing in the room set about not  getting his pain medications. Only when I told him that I could give him a  one-time dose he made eye contact and was  ready to talk with me. Says that he  cannot undergo a nuclear stress test because that has caused significant  discomfort in the past. He agrees to do an exercise stress test.    Patient will be admitted to the medical service for ruling out ACS      Review of Systems    12 point review of systems negative other than mentioned above in history.    Code Status    No qualifying data available.    Physical Exam    Vitals & Measurements    **T: **98.3 F  (Oral) **HR: **85 **RR: **18 **BP: **131/86 **SpO2: **98% **O2  Method (L/min): **Room air **WT: **120.202 Kg    General: Alert , oriented, not in acute distress    HEENT: atraumatic, normocephalic, PERRLA    CVS: S1S2 heard normally,no gallops rubs or murmur heard    Chest: clear to auscultation bilaterally,    Abdomen: Soft , non tender, no rebound, no guarding    Neuro: Cranial nerves Grossly normal, no motor deficits, no sensory  deficits,  Coordination , good, Reflexes : intact, Gait normal    Musculoskeletal: No deformities    Impression and Plan    The patient is a 42 year old gentleman with a past medical history significant  for coronary artery disease status post CABG who presented to the emergency  room with complaints of chest pain.        # chest pain:    EKG shows : NSR    Troponin:negative so far. He was admitted to the main campus possible compress  yesterday and the troponins have been negative.        - monitor on telemetry    - cycle troponins x3    -nitroglycerine PRN for chest pain    - plan stress test        # Pain:    Concerns for pain medication seeking behavior in this patient. He reports that  he breaks into hives when he uses morphine or tramadol. He says that only  Dilaudid works for him. The patient did not appear to be in pain when I saw  him. The very ID of one-time dose of Dilaudid, calmed him down    I have explained  to him that I will not be giving him any further doses of  Dilaudid.    CMS Two-Midnight Rule        Problem List/Past Medical History    Ongoing    CAD    Chest pain    Diabetes    Hypertension    MI    Historical    No qualifying data    Procedure/Surgical History    CABG, chole.    Social History    _Alcohol_    Current    _Substance Abuse_ \- Denies Substance Abuse    _Tobacco_    Current, Cigarettes    Family History    Allergies    Motrin (Hives, Anaphylactic reaction)    morphine    traMADOL    Toradol    Medications    _Inpatient_    acetaminophen tablet, 650 mg= 2 tab(s), PO, q4hr, PRN    acetaminophen tablet, 650 mg= 2 tab(s), PO, q4hr, PRN    aspirin, 81 mg= 1 tab(s), PO, Daily    atorvastatin 40 mg oral tablet, 40 mg= 1 tab(s), PO, Daily    Benadryl, 25 mg= 1 cap(s), PO, q8hr, PRN    bisacodyl, 10 mg= 1 supp, PR, Daily, PRN    Dilaudid, 0.5 mg= 0.5 mL, IV Push, q6hr, PRN    guaiFENesin, 100 mg= 5 mL, PO, q4hr, PRN    Maalox, 30 mL, PO, q4hr, PRN    Milk of Magnesia 8% oral suspension,  2400 mg= 30 mL, PO, Daily, PRN    nicotine patch 7mg , 7 mg= 1 patch(es), Transderm, Daily    ondansetron, 4 mg= 2 mL, IV Push, q8hr, PRN    pantoprazole, 40 mg= 1 tab(s), PO, Daily    _Home_    aspirin, 81 mg, PO, Daily    atorvastatin 40 mg oral tablet, 40 mg= 1 tab(s), PO, Daily    Metoprolol Tartrate 25 mg oral tablet, 25 mg= 1 tab(s), PO, BID    Diet    Low Sodium Diet - Ordered    -- 10/11/17 7:06:00 EDT, Room Service, Scheduled / PRN    Lab Results    Glucose Lvl: 128 mg/dL High (54/09/81 19:14:78)    BUN: 14 mg/dL (29/56/21 30:86:57)    Creatinine: 1.13 mg/dL (84/69/62 95:28:41)    Afn Amer Glomerular Filtration Rate: >90 (10/11/17 03:16:00)    Non-Afn Amer Glomerular Filtration Rate: 81 ml/min/1.65m2 (10/11/17 03:16:00)    Sodium Lvl: 141 mmol/L (10/11/17 03:16:00)    Potassium Lvl: 3.7 mmol/L (10/11/17 03:16:00)    Chloride: 107 mmol/L (10/11/17 03:16:00)    CO2: 29 mmol/L (10/11/17 03:16:00)    Anion Gap: 5 (10/11/17 03:16:00)    Total Protein: 7.6 Gm/dL (32/44/01 02:72:53)    Albumin Lvl: 3.6 Gm/dL (66/44/03 47:42:59)    Calcium Lvl: 9.6 mg/dL (56/38/75 64:33:29)    Bilirubin Total: 0.3 mg/dL (51/88/41 66:06:30)    Alkaline Phosphatase: 62 Units/L (10/11/17 03:16:00)    AST: 44 Units/L High (10/11/17 03:16:00)    ALT: 96 Units/L High (10/11/17 03:16:00)    Troponin I: <0.015 (10/11/17 03:16:00)    WBC: 6.7 thous/mm3 (10/11/17 03:16:00)    RBC: 4.35 Mil/mm3 (10/11/17 03:16:00)    Hgb: 13.3 Gm/dL (16/01/09 32:35:57)    Hct: 41 % (10/11/17 03:16:00)    Platelet: 220 thous/mm3 (10/11/17 03:16:00)    MCV: 94.3 fL (10/11/17 03:16:00)    MCH: 30.6 pGm (10/11/17 03:16:00)    MCHC: 32.4 Gm/dL (32/20/25 42:70:62)    RDW-SD: 46.2 fL (10/11/17  03:16:00)    MPV: 10.3 fL (10/11/17 03:16:00)    Absolute Neutro Count: 4.04 thous/mm3 (10/11/17 03:16:00)    Absolute Lymphs Count: 2.1 thous/mm3 (10/11/17 03:16:00)    Absolute Mono Count: 0.41 thous/mm3 (10/11/17 03:16:00)    Absolute Eos Count: 0.08 thous/mm3 (10/11/17  03:16:00)    Absolute Baso Count: 0.01 thous/mm3 (10/11/17 03:16:00)    Neutrophils: 60.7 % (10/11/17 03:16:00)    Lymphocytes: 31.5 % (10/11/17 03:16:00)    Monocytes: 6.1 % (10/11/17 03:16:00)    Eosinophils: 1.2 % (10/11/17 03:16:00)    Basophils: 0.1 % (10/11/17 03:16:00)    Immature Granulocytes: 0.4 % (10/11/17 03:16:00)    NRBC Percent: 0 % (10/11/17 03:16:00)    Absolute NRBC Count: 0 thous/mm3 (10/11/17 03:16:00)    Lav Top Tube to Hold: DONE (10/11/17 08:31:00)    Blue Top Tube To Hold: DONE (10/11/17 03:16:00)    Diagnostic Results        ------        Lenox Ponds LINE Electronically signed by Redmond Baseman MD on 10/11/2017 at  10:25:26 EST

## 2017-12-01 LAB — BMP (EXT)
Anion Gap (EXT): 12 mmol/L (ref 3–17)
BUN (EXT): 7 mg/dL — ABNORMAL LOW (ref 8–25)
CO2 (EXT): 26 mmol/L (ref 23–32)
CalciumCalcium (EXT): 10.1 mg/dL (ref 8.5–10.5)
Chloride (EXT): 103 mmol/L (ref 98–108)
Creatinine (EXT): 0.94 mg/dL (ref 0.60–1.50)
GFR Estimated (Calc) (EXT): 101 mL/min/{1.73_m2} (ref >59)
Glucose (EXT): 100 mg/dL (ref 70–110)
Potassium (EXT): 3.7 mmol/L (ref 3.4–5.0)
Sodium (EXT): 141 mmol/L (ref 135–145)

## 2017-12-04 LAB — PROTHROMBIN TIME CARE EVERYWHERE
INR CARE EVERYWHERE: 0.95 — ABNORMAL LOW (ref 1.50–4.00)
PROTHROMBIN TIME CARE EVERYWHERE: 12.7 s — NL (ref 11.5–14.3)

## 2017-12-04 LAB — APTT CARE EVERYWHERE: APTT CARE EVERYWHERE: 25.2 s — NL (ref 22.3–34.6)

## 2017-12-10 LAB — BMP (EXT)
Anion Gap (EXT): 9 mmol/L (ref 3–17)
BUN (EXT): 9 mg/dL (ref 6–20)
CO2 (EXT): 26 mmol/L (ref 22–32)
CalciumCalcium (EXT): 10 mg/dL (ref 8.9–10.3)
Chloride (EXT): 104 mmol/L (ref 98–107)
Creatinine (EXT): 0.93 mg/dL (ref 0.6–1.3)
GFR Estimated (Calc) (EXT): 102 mL/min/{1.73_m2} (ref 60–128)
Glucose (EXT): 116 mg/dL — ABNORMAL HIGH (ref 65–99)
Potassium (EXT): 4.1 mmol/L (ref 3.6–5.1)
Sodium (EXT): 139 mmol/L (ref 136–145)

## 2017-12-14 LAB — BMP (EXT)
Anion Gap (EXT): 7 mmol/L (ref 3–17)
BUN (EXT): 9 mg/dL (ref 6–20)
CO2 (EXT): 30 mmol/L (ref 22–32)
CalciumCalcium (EXT): 9.6 mg/dL (ref 8.9–10.3)
Chloride (EXT): 106 mmol/L (ref 98–107)
Creatinine (EXT): 0.87 mg/dL (ref 0.6–1.3)
GFR Estimated (Calc) (EXT): 108 mL/min/{1.73_m2} (ref 60–128)
Glucose (EXT): 82 mg/dL (ref 65–99)
Potassium (EXT): 4.7 mmol/L (ref 3.6–5.1)
Sodium (EXT): 143 mmol/L (ref 136–145)

## 2017-12-16 ENCOUNTER — Ambulatory Visit: Admitting: Emergency Medicine

## 2017-12-16 ENCOUNTER — Emergency Department
Admit: 2017-12-16 | Disposition: A | Discharge status: Home / Self Care | Source: Home / Self Care | Attending: Emergency Medicine | Admitting: Emergency Medicine

## 2017-12-16 ENCOUNTER — Emergency Department
Admit: 2017-12-16 | Disposition: A | Discharge status: Home / Self Care | Payer: No Typology Code available for payment source

## 2017-12-16 LAB — BMP (EXT)
Anion Gap (EXT): 14 mmol/L (ref 3–17)
BUN (EXT): 11 mg/dL (ref 8–25)
CO2 (EXT): 25 mmol/L (ref 23–32)
CalciumCalcium (EXT): 10.1 mg/dL (ref 8.5–10.5)
Chloride (EXT): 104 mmol/L (ref 98–108)
Creatinine (EXT): 1.04 mg/dL (ref 0.60–1.50)
GFR Estimated (Calc) (EXT): 89 mL/min/{1.73_m2} (ref >59)
Glucose (EXT): 90 mg/dL (ref 70–110)
Potassium (EXT): 4.3 mmol/L (ref 3.4–5.0)
Sodium (EXT): 143 mmol/L (ref 135–145)

## 2017-12-16 LAB — PROTHROMBIN TIME CARE EVERYWHERE
INR CARE EVERYWHERE: 1 — NL (ref 0.9–1.1)
PROTHROMBIN TIME CARE EVERYWHERE: 13.4 s — NL (ref 11.5–14.5)

## 2017-12-16 NOTE — ED Provider Notes (Signed)
[name]  Name: Dandrea, Widdowson  MRN: [id number]  Age: 42 yrs  Sex: Male  DOB: [birth year]  Arrival Date: 12/16/2017  Arrival Time: 18:03  Account#: [id number]  Bed ASW1  PCP:  Chief Complaint: Toothache  .  Presentation:  07/29  18:11 Presenting complaint: Patient states: he had a tooth pulled     jg8        about one month ago on the bottom right and that area is        painful. Also c/o upper right dental pain.  [name]  Historical:  - Allergies:  18:12 Codeine;                                                        jg8  18:12 Motrin;                                                         jg8  18:12 Toradol;                                                        jg8  18:12 Tramadol HCl;                                                   jg8  - Home Meds:  18:12 Aspirin Oral [Active]; Lipitor Oral [Active]; Metoprolol        jg8        Tartrate Oral [Active];  - PMHx:  18:12 CAD; Diabetes - IDDM; High Cholesterol; Hypertension; mi x3;    jg8  .  - Social history: Smoking status: The patient is not a current    smoker.  .  .  Screening:  18:13 SEPSIS SCREENING - Temp > 38.3 or < 36.0 No - Heart Rate > 90   jg8        Yes - Respiratory > 20 No - SBP < 90 No SIRS Criteria (> = 2)        No. Safety screen: Patient feels safe. Suicide Screening:        Patient denies thoughts of harm. Fall Risk None identified.        Exposure Risk/Travel Screening: No. The patient reports that        they have not travelled outside out of the [place] in the past 30        days.  19:13 Nutritional screening: No deficits noted. Tuberculosis          ji2        screening: No symptoms or risk factors identified.  .  Vital Signs:  18:05 BP 124 / 77 Left Arm Sitting (auto/reg); Pulse 93 Monitor; Resp rc11        16 Spontaneous; Temp 36.2(O); Pulse Ox 97% on R/A; Weight  117.93 kg (R); Height 6 ft. 4 in. (193.04 cm) (R); Pain 8/10;  18:05 Body Mass Index 31.65 (117.93 kg, 193.04 cm)                    rc11  .  Glasgow Coma Score:  19:13 Eye Response:  spontaneous(4). Verbal Response: oriented(5).     ji2        Motor Response: obeys commands(6). Total: 15.  .  Name:Cameron, [name]  [id number]  [id number]  Page 1 of 3  %%PAGE  .  Name: Joziah, Dollins  MRN: [id number]  Age: 82 yrs  Sex: Male  DOB: Oct 06, 1975  Arrival Date: 12/16/2017  Arrival Time: 18:03  Account#: [id number]  Bed ASW1  PCP:  Chief Complaint: Toothache  .  [name]  Triage Assessment:  18:12 General: Appears uncomfortable, well nourished, well groomed,   jg8        Behavior is cooperative. Pain: Complains of pain in right upper        and lower dental quadrant.  .  Assessment:  19:13 General: Appears comfortable, Behavior is anxious, appropriate  ji2        for age, cooperative. Pain: Complains of pain in face. Neuro:        Patient reports had tooth pulled last week and need more work        from [facility], pt here for pain control r upper and lower        tooth pain. Respiratory: No deficits noted. Skin: Skin is        normal.  .  Observations:  18:03 Patient arrived in ED.                                          hh10  19:08 Patient Visited By: Rosebud Poles                            ad25  19:14 Patient Visited By: Francine Graven  Dispensed Medications:  19:50 Drug: Percocet 1 PO - oxyCODONE-acetaminophen 5 mg-325 mg 1     ji2        tabs Route: PO;  20:03 Follow up: Response: No Adverse Reaction                        ji2  .  .  Interventions:  19:13 Armband on Call light in reach Bed in low position. Verbal      ji2        reassurance given.  .  Outcome:  19:58 Discharged to home ambulatory. Condition: stable. Discharged by ji2        MD. Discharge Assessment: Patient awake, alert and oriented x        3. No cognitive and/or functional deficits noted. Patient        verbalized understanding of disposition instructions. Chart        Status Nursing note complete and electronically signed.  20:00 Discharge ordered by MD.  ad3  20:03 Patient left the ED.                                            ji2  .  Signatures:  Renold Genta                           MD   Marvia Pickles                         RN   jg8  [name]                        RN   ji2  .  Name:Sorrels, Boykin  [id number]  [id number]  Page 2 of 3  %%PAGE  .  Name: Winn, Muehl  MRN: [id number]  Age: 85 yrs  Sex: Male  DOB: June 05, 1975  Arrival Date: 12/16/2017  Arrival Time: 18:03  Account#: [id number]  Bed ASW1  PCP:  Chief Complaint: Toothache  .  Rosebud Poles                             ad25  [place], Rachel                                rc11  Severance, Spurgeon                            hh10  .  .  .  .  .  .  .  .  .  .  .  .  .  .  .  .  .  .  .  .  .  .  .  .  .  .  .  .  .  .  .  .  .  .  .  .  .  .  .  .  Name:Borah, Gerritt  [id number]  [id number]  Page 3 of 3  .  %%END

## 2017-12-16 NOTE — ED Provider Notes (Signed)
[name] Kitchen  [name], [name]  MRN: [id number]  Age: 42 yrs  Sex: Male  DOB: 28-Sep-1975  Arrival Date: 12/16/2017  Arrival Time: 18:03  Account#: [id number]  .  Working Diagnosis: Dental caries  PCP:  .  Historical:  - Allergies: Codeine; Motrin; Toradol; Tramadol HCl;  - Home Meds: Aspirin Oral; Lipitor Oral; Metoprolol Tartrate Oral;  - PMHx: CAD; Diabetes - IDDM; High Cholesterol; Hypertension;    mi x3;  - Social history: Smoking status: The patient is not a current    smoker.  .  .  Vital Signs:  07/29  18:05 BP 124 / 77 Left Arm Sitting (auto/reg); Pulse 93 Monitor; Resp rc11        16 Spontaneous; Temp 36.2(O); Pulse Ox 97% on R/A; Weight        117.93 kg (R); Height 6 ft. 4 in. (193.04 cm) (R); Pain 8/10;  18:05 Body Mass Index 31.65 (117.93 kg, 193.04 cm)                    rc11  .  Glasgow Coma Score:  19:13 Eye Response: spontaneous(4). Verbal Response: oriented(5).     ji2        Motor Response: obeys commands(6). Total: 15.  .  MDM:  .  07/29  18:42 Order name: Wyvonnia Dusky; Complete Time: 19:47             dispa  t  07/29  18:42 Order name: EDIE_CAREPLAN; Complete Time: 19:47                 dispa  t  .  Dispensed Medications:  19:50 Drug: Percocet 1 PO - oxyCODONE-acetaminophen 5 mg-325 mg 1     ji2        tabs Route: PO;  20:03 Follow up: Response: No Adverse Reaction                        ji2  .  .  Attending Notes:  19:49 Attending HPI: HPI: 42 year old male with history of diabetes ad3        hypertension history of recurrent dental caries who had a tooth        extraction of he right lower molar 2 weeks ago and is scheduled        to undergo an extraction of a tooth of the right upper molar        this Wednesday. Has been having increasing pain despite taking        Tylenol. He has been on antibiotics so on. He presents here for        pain control. Denies any fever or chills reports no difficulty        swallowing no sore throat or cough. Patient has multiple drug  .  Name:Hendricksen,  [name]  [id number]  [id number]  Page 1 of 3  %%PAGE  .  [name]  MRN: [id number]  Age: 72 yrs  Sex: Male  DOB: Jun 12, 1975  Arrival Date: 12/16/2017  Arrival Time: 18:03  Account#: [id number]  .  Working Diagnosis: Dental caries  PCP:  .        allergies to various pain control medication including        ibuprofen, Toradol and tramadol. Attending ROS Constitutional:        no fever malaise or weight loss ENT: no sorethroat Neck: no        masses no pain Skin:  No skin rash. Attending Exam: My personal        exam reveals Nontoxic-appearing there is no obvious facial        asymmetry head is normocephalic atraumatic there is no trismus        intraorally there are multiple missing teeth with decay of the        tooth #2 is no obvious focal area of induration or fluctuance        there is no pharyngeal injection strong smell of tobacco or        uvula is midline with no oropharyngeal edema there is no        mandibular or cervical lymphadenopathy neck is supple lungs are        clear to auscultation bilaterally. I have reviewed the Nurses        Notes. ED Course: 42 year old male with poor dentition is        scheduled to undergo extraction of tooth #2. Patient has        multiple missing teeth currently on antibiotics has been taking        Tylenol. Has multiple allergies to different pain medication        and is requesting a Percocet. He was given a dose here and        instructed to followup with dental on Wednesday. My Working        Impression: Dental caries. Attending chart complete and        electronically signed: Doran Stabler, MS MD.  .  Disposition Summary:  12/16/17 20:00  Discharge Ordered        Location: Home -                                                ad3        Problem: an ongoing problem                                     ad3        Symptoms: are unchanged                                         ad3        Condition: Stable                                                ad3        Diagnosis          - Dental caries                                               ad3        Followup:  ad3          - With: [facility] [place]          - When: As Scheduled          - Reason: Arrange outpatient testing/referral to specialist        Discharge Instructions:          - Discharge Summary Sheet                                     ad3          - DENTAL CAVITY                                               ad3        Forms:          - Medication Reconciliation Form                              ad3  Signatures:  Renold Genta                           MD   ad3  Verl Dicker                         RN   jg8  .  [name]  [id number]  [id number]  Page 2 of 3  %%PAGE  .  [name]  MRN: [id number]  Age: 14 yrs  Sex: Male  DOB: 1976-03-01  Arrival Date: 12/16/2017  Arrival Time: 18:03  Account#: [id number]  .  Working Diagnosis: Dental caries  PCP:  .  [name]                        RN   ji2  Renato Gails, Anastacia                             ad25  .  Document is preliminary until electronically or manually signed by the atte  nding physician  .  .  .  .  .  .  .  .  .  .  .  .  .  .  .  .  .  .  .  .  .  .  .  .  .  .  .  .  .  .  .  .  .  .  .  .  .  .  .  [name]  [id number]  [id number]  Page 3 of 3  .  %%END

## 2017-12-18 ENCOUNTER — Emergency Department
Admit: 2017-12-18 | Discharge status: Against Medical Advice | Source: Ambulatory Visit | Attending: Student in an Organized Health Care Education/Training Program | Admitting: Student in an Organized Health Care Education/Training Program

## 2017-12-18 ENCOUNTER — Ambulatory Visit: Admitting: Student in an Organized Health Care Education/Training Program

## 2017-12-18 ENCOUNTER — Emergency Department
Admit: 2017-12-18 | Disposition: A | Discharge status: Home / Self Care | Payer: No Typology Code available for payment source

## 2017-12-18 LAB — HX CHEM-PANELS
HX ANION GAP: 7 (ref 3–14)
HX BLOOD UREA NITROGEN: 12 mg/dL (ref 6–24)
HX CHLORIDE (CL): 107 meq/L (ref 98–110)
HX CO2: 27 meq/L (ref 20–30)
HX CREATININE (CR): 1.01 mg/dL (ref 0.57–1.30)
HX GFR, AFRICAN AMERICAN: 107 mL/min/{1.73_m2}
HX GFR, NON-AFRICAN AMERICAN: 93 mL/min/{1.73_m2}
HX GLUCOSE: 81 mg/dL (ref 70–139)
HX POTASSIUM (K): 3.9 meq/L (ref 3.6–5.1)
HX SODIUM (NA): 141 meq/L (ref 135–145)

## 2017-12-18 LAB — HX HEM-ROUTINE
HX BASO #: 0 10*3/uL (ref 0.0–0.2)
HX BASO: 0 %
HX EOSIN #: 0.1 10*3/uL (ref 0.0–0.5)
HX EOSIN: 2 %
HX HCT: 41.4 % (ref 37.0–47.0)
HX HGB: 13.3 g/dL — ABNORMAL LOW (ref 13.5–16.0)
HX IMMATURE GRANULOCYTE#: 0 10*3/uL (ref 0.0–0.1)
HX IMMATURE GRANULOCYTE: 0 %
HX LYMPH #: 2.2 10*3/uL (ref 1.0–4.0)
HX LYMPH: 36 %
HX MCH: 30.7 pg (ref 26.0–34.0)
HX MCHC: 32.1 g/dL (ref 32.0–36.0)
HX MCV: 95.6 fL (ref 80.0–98.0)
HX MONO #: 0.5 10*3/uL (ref 0.2–0.8)
HX MONO: 8 %
HX MPV: 10.3 fL (ref 9.1–11.7)
HX NEUT #: 3.2 10*3/uL (ref 1.5–7.5)
HX NRBC #: 0 10*3/uL
HX NUCLEATED RBC: 0 %
HX PLT: 226 10*3/uL (ref 150–400)
HX RBC BLOOD COUNT: 4.33 M/uL (ref 4.20–5.50)
HX RDW: 13 % (ref 11.5–14.5)
HX SEG NEUT: 54 %
HX WBC: 6.1 10*3/uL (ref 4.0–11.0)

## 2017-12-18 LAB — HX DIABETES: HX GLUCOSE: 81 mg/dL (ref 70–139)

## 2017-12-18 LAB — HX COAGULATION
HX INR PT: 1 (ref 0.9–1.3)
HX PROTHROMBIN TIME: 11.8 s (ref 9.7–14.0)
HX PTT: 29.7 s (ref 25.7–35.7)

## 2017-12-18 LAB — HX TRANSFUSION

## 2017-12-18 LAB — HX CHEM-ENZ-FRAC: HX TROPONIN I: <0.01 ng/mL (ref 0.00–0.03)

## 2017-12-18 NOTE — ED Provider Notes (Signed)
[name]  [name], [name]  MRN: [id number]  Age: 42 yrs  Sex: Male  DOB: Nov 03, 1975  Arrival Date: 12/18/2017  Arrival Time: 20:52  Account#: [id number]  .  Working Diagnosis: Chest pain, unspecified  PCP: No Pcp, Per, PATIENT  .  Historical:  - Allergies: Codeine; Motrin; Toradol; Tramadol HCl;  - Home Meds: Aspirin Oral; Lipitor Oral; Metoprolol Tartrate Oral;  - PMHx: CAD; Diabetes - IDDM; High Cholesterol; Hypertension;    mi x3;  - Social history: Smoking status: Patient uses tobacco    products, current every day smoker. No barriers to    communication noted, The patient speaks fluent [place].  .  .  Vital Signs:  07/31  20:56 BP 138 / 88; Pulse 73; Resp 16; Temp 36.6; Pulse Ox 97% on R/A; jh38  22:24 BP 118 / 70; Pulse 72;                                          as51  22:24 Pre first nitro- pt states nitro never works for him and he     as51        normally is relieved with morphine.  Maury Dus Coma Score:  20:56 Eye Response: spontaneous(4). Verbal Response: oriented(5).     OZ30        Motor Response: obeys commands(6). Total: 15.  21:00 Eye Response: spontaneous(4). Verbal Response: oriented(5).     mt20        Motor Response: obeys commands(6). Total: 15.  21:00 Eye Response: spontaneous(4). Verbal Response: oriented(5).     mt20        Motor Response: obeys commands(6). Total: 15.  .  MDM:  .  07/31  21:04 Order name: EDIE_CAREPLAN; Complete Time: 21:32                 dispa  t  07/31  21:04 Order name: Wyvonnia Dusky; Complete Time: 21:32             dispa  t  07/31  21:31 Order name: BUN (Blood Urea Nitrogen); Complete Time: 22:47     so5  07/31  21:31 Order name: CBC/Diff (With Plt); Complete Time: 22:47           so5  07/31  21:31 Order name: CR (Creatinine); Complete Time: 22:47               so5  07/31  21:31 Order name: GLU (Glucose); Complete Time: 22:47                 so5  07/31  21:31 Order name: LYTES (Na, K, Cl, Co2); Complete Time: 22:47        so5  07/31  .  Name:Harbor,  [name]  [id number]  [id number]  Page 1 of 3  %%PAGE  .  [name]  MRN: [id number]  Age: 74 yrs  Sex: Male  DOB: [birth year]  Arrival Date: 12/18/2017  Arrival Time: 20:52  Account#: [id number]  .  Working Diagnosis: Chest pain, unspecified  PCP: No Pcp, Per, PATIENT  .  21:31 Order name: PT (Prothrombin Time With INR); Complete Time: 22:47so5  07/31  21:31 Order name: PTT; Complete Time: 22:47                           so5  07/31  21:31 Order name: Troponin I; Complete Time: 22:47                    so5  07/31  21:31 Order name: CXR (PA/Lat); Complete Time: 22:47                  so5  07/31  22:45 Order name: GFR, AA; Complete Time: 22:47                       dispa  t  07/31  22:45 Order name: GFR, NAA; Complete Time: 22:47                      dispa  t  07/31  22:49 Order name: Blood Bank Hold; Complete Time: 23:12               dispa  t  07/31  21:31 Order name: Adult EKG (order using folder); Complete Time: 21:31so5  07/31  21:31 Order name: Cardiac Monitor; Complete Time: 21:39               so5  07/31  21:31 Order name: EKG (order using folder); Complete Time: 21:39      so5  07/31  21:31 Order name: Pulse Oximetry Continuous; Complete Time: 21:39     so5  .  Dispensed Medications:  22:23 Drug: Nitroglycerin Tablet 0.4mg  0.4 mg Route: Sublingual;      as51  .  [name]  Radiology Orders:  Order Name: CXR (PA/Lat); Last Status: Reviewed; Time: 12/18/17    21:31; By: Augusto Gamble; For: so5; Order Method: Electronic; Notes:    Bed Name: A10  Attending Notes:  21:56 Attestation: Assessment and care plan reviewed with             na7        resident/midlevel provider. See their note for details.        Physician Assistant's history reviewed, patient interviewed and        examined. 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  Name:[name], [name]  [id number]  [id number]  Page 2 of 3  %%PAGE  .  [name]  MRN: [id number]  Age: 38 yrs  Sex: Male  DOB: [birth year]  Arrival Date: 12/18/2017  Arrival Time: 20:52  Account#: [id number]  .  Working Diagnosis: Chest pain, unspecified  PCP: No Pcp, Per, PATIENT  .        Medical) who presents with chest pain. Pt admitted in Jan,        concern for drug seeking behavior.Multiple visits this month at        other EDs for chest pain. Pt with Cardiac CT angiogram Coronary  calcium score: Zero. 2.  21:57 Lab/Ancillary show: EKG interpreted by me and shows: EKG: SR    na7        66, NA, QTc 390, no STEMI.  .  Disposition Summary:  12/18/17 23:26  Left Against Medical Advice        Location: Home                                                  so5        Problem: new                                                    so5        Symptoms: have improved                                         so5        Condition: Stable                                               so5        Diagnosis          - Chest pain, unspecified                                     so5        Followup:                                                       so5          - With: Private Physician          - When: As needed          - Reason: Continuance of care        Discharge Instructions:          - Discharge Summary Sheet                                     so5          - CHEST PAIN, Uncertain Cause                                 so5  Signatures:  Dispatcher, [employer]                          dispa  Jennings Books, Mathianaud                          mj11  Marny Lowenstein                              MD   Wendy Poet, [place]                           PA   so5  Rosanne Sack                        RN   mt20  Olena Heckle                          RN   ZO10  Ambrose Pancoast, April                                as51  .  Corrections: (The following items were deleted from the chart)  22:15 21:56 Attending HPI: Social History: Lives at home Family       na7        History: No one sick at home HPI: 42yo M na7  .  Document is preliminary until electronically or manually signed by the atte  nding physician  .  .  .  .  .  .  Name:Kearl, Tarique  [id number]  [id number]  Page 3 of 3  .  %%END

## 2017-12-18 NOTE — ED Provider Notes (Signed)
.  .  [name], [name]  MRN: [id number]  Age: 42 yrs  Sex: Male  DOB: 03-26-1976  Arrival Date: 12/18/2017  Arrival Time: 20:52  Account#: [id number]  Bed A10  PCP: No Pcp, Per, PATIENT  Chief Complaint: Chest Pain  .  Presentation:  07/31  20:53 Presenting complaint: EMS states: Pt presents to ED with CO mid jh38        sternal, non-radiating chest pain 7/10 that was not        reporducible by EMS approximately 15-20 minutes prior to EMS        arrival. Without SOB, pt denies cough. Per report pt also with        weakness and dizziness. Symptoms improving at this time. 324 mg        ASA. Pt is PWD, neuro intact a this time. Pt with cardiac hx,        CABG, MI, HTN.  20:53 Method Of Arrival: EMS: [facility]                              [phone]  20:53 Acuity: Adult 3                                                 jh38  .  Historical:  - Allergies:  20:56 Codeine;                                                        jh38  20:56 Motrin;                                                         MW41  20:56 Toradol;                                                        LK44  20:56 Tramadol HCl;                                                   WN02  - Home Meds:  20:56 Aspirin Oral [Active]; Lipitor Oral [Active]; Metoprolol        jh38        Tartrate Oral [Active];  - PMHx:  20:56 CAD; Diabetes - IDDM; High Cholesterol; Hypertension; mi x3;    jh38  .  - Social history: Smoking status: Patient uses tobacco    products, current every day smoker. No barriers to    communication noted, The patient speaks fluent [place].  .  .  Screening:  20:56 SEPSIS SCREENING SIRS Criteria (> = 2) No. Safety screen:       VO53  Patient feels safe. Suicide Screening: Patients presentation:        No risk factors. Fall Risk None identified. Exposure        Risk/Travel Screening: No. The patient reports that they have        not travelled outside out of the [place] in the past 30 days.  .  Vital Signs:  20:56 BP 138 / 88; Pulse 73; Resp  16; Temp 36.6; Pulse Ox 97% on R/A; jh38  22:24 BP 118 / 70; Pulse 72;                                          as51  22:24 Pre first nitro- pt states nitro never works for him and he     as51        normally is relieved with morphine.  [name]  Name:Lata, Rinaldo  [id number]  [id number]  Page 1 of 3  %%PAGE  .  Name: [name], [name]  MRN: [id number]  Age: 63 yrs  Sex: Male  DOB: 08-08-1975  Arrival Date: 12/18/2017  Arrival Time: 20:52  Account#: [id number]  Bed A10  PCP: No Pcp, Per, PATIENT  Chief Complaint: Chest Pain  .  Glasgow Coma Score:  20:56 Eye Response: spontaneous(4). Verbal Response: oriented(5).     QI69        Motor Response: obeys commands(6). Total: 15.  21:00 Eye Response: spontaneous(4). Verbal Response: oriented(5).     mt20        Motor Response: obeys commands(6). Total: 15.  21:00 Eye Response: spontaneous(4). Verbal Response: oriented(5).     mt20        Motor Response: obeys commands(6). Total: 15.  .  Triage Assessment:  20:56 General: Appears in no apparent distress. Pain: Complains of    jh38        pain in chest the pain radiates to the: Pain does not radiate.        Pain currently is 7/10. Cardiovascular: Capillary refill < 3        seconds Pulses are all present. Chest pain is described as        mild. Respiratory: Airway is patent Trachea midline Breath        sounds are clear.  .  Assessment:  21:00 Reassessment: PT presented with 8/10 chest pain. States it      mt20        started approximately 30 minutes prior to arrival. PT does have        a history of a CABG. PT states the pain radiates down to the        arm. PT appears in no distress. General: Appears in no apparent        distress, comfortable, Behavior is cooperative. Pain: Complains        of pain in chest. Neuro: Eye opening: Spontaneously Level on        consciousness: Sustained Attention Verbal Response:        Orientation:. Cardiovascular: Pulses are all present.        Respiratory: Airway is patent Respiratory  effort is even,        unlabored, Respiratory pattern is regular, symmetrical. Skin:        Skin is pink, warm / dry.  .  Observations:  20:52 Patient arrived in ED.  JS28  20:55 Triage Completed.                                               BT51  21:21 Patient Visited By: Eulah Citizen                              so5  22:27 Registration completed.                                         mj11  22:27 Patient Visited By: Marge Duncans  .  Procedure:  21:05 EKG done. (by ED staff). Old EKG Obtained Reviewed By: Providence Lanius MD.  22:29 Inserted peripheral IV: 20 gauge in right antecubital area.     VO16  .  Dispensed Medications:  .  Name:Cockerill, Manish  [id number]  [id number]  Page 2 of 3  %%PAGE  .  Name: [name], [name]  MRN: [id number]  Age: 41 yrs  Sex: Male  DOB: 1975/12/09  Arrival Date: 12/18/2017  Arrival Time: 20:52  Account#: [id number]  Bed A10  PCP: No Pcp, Per, PATIENT  Chief Complaint: Chest Pain  .  22:23 Drug: Nitroglycerin Tablet 0.4mg  0.4 mg Route: Sublingual;      as51  .  [name]  Interventions:  21:00 Armband on Call light in reach Bed in low position Side Rail up mt20        X 2. Cardiac monitor on. Pulse ox on. NIBP on.  21:39 EMS Sheet Scanned into Chart                                    dw  21:43 ECG/EKG Scanned into Chart                                      dw  22:42 Demo Sheet Scanned into Chart                                   dw  .  Outcome:  23:26 Patient left against medical advice.                            so5  23:42 AMA Other PT pulled out IV and all leads and left. PT not in    mt20        the room when went in to check. PT left before signing form and        before discharge vitals.  23:43 Patient left the ED.  Mikle Bosworth  SignaturesModesto Charon, [place]                                  dw  [place], [place]                          mj11  Burleson, [place]                           [place]   so5  Rosanne Sack                        RN   mt20  Olena Heckle                          RN   ZO10  Suprey, April                                as51  Daryl Eastern                            kw26  .  .  .  .  .  .  .  .  .  .  .  .  .  .  .  .  .  .  Name:Braid, Domanic  [id number]  [id number]  Page 3 of 3  .  %%END

## 2017-12-31 LAB — PROTHROMBIN TIME CARE EVERYWHERE: INR CARE EVERYWHERE: 1 — NL (ref 0.9–1.1)

## 2017-12-31 LAB — APTT CARE EVERYWHERE: APTT CARE EVERYWHERE: 29 s — NL (ref 22–36)

## 2018-01-04 LAB — BMP (EXT)
Anion Gap (EXT): 15 mmol/L (ref 3–17)
BUN (EXT): 9 mg/dL (ref 8–25)
CO2 (EXT): 23 mmol/L (ref 23–32)
CalciumCalcium (EXT): 9.2 mg/dL (ref 8.5–10.5)
Chloride (EXT): 102 mmol/L (ref 98–108)
Creatinine (EXT): 0.94 mg/dL (ref 0.60–1.50)
GFR Estimated (Calc) (EXT): 101 mL/min/{1.73_m2} (ref >59)
Glucose (EXT): 86 mg/dL (ref 70–110)
Potassium (EXT): 4.1 mmol/L (ref 3.4–5.0)
Sodium (EXT): 140 mmol/L (ref 135–145)

## 2018-01-05 ENCOUNTER — Ambulatory Visit

## 2018-01-05 LAB — HX CBC W/DIFF
HX ABSOLUTE BASO COUNT AUTODIFF: 0.02 10*3/uL (ref 0.0–0.22)
HX ABSOLUTE EOS COUNT AUTODIFF: 0.11 10*3/uL (ref 0.0–0.45)
HX ABSOLUTE LYMPHS COUNT AUTODIFF: 2.05 10*3/uL (ref 0.74–5.04)
HX ABSOLUTE MONO COUNT AUTODIFF: 0.46 10*3/uL (ref 0.0–1.34)
HX ABSOLUTE NEUTRO COUNT AUTODIFF: 3.68 10*3/uL (ref 1.48–7.95)
HX BASOPHIL AUTOMATED: 0.3 %
HX EOSINOPHIL AUTOMATED: 1.7 %
HX HEMATOCRIT: 39.6 % (ref 39.0–53.0)
HX HEMOGLOBIN: 12.9 g/dL — ABNORMAL LOW (ref 13.0–17.5)
HX IG AUTOMATED: 0.6 % (ref 0.0–2.0)
HX LYMPHOCYTE AUTOMATED: 32.2 %
HX MEAN CORP.HEMO.CONC.: 32.6 g/dL (ref 31.0–37.0)
HX MEAN CORPUSCULAR HEMOGLOBIN: 30.8 pg (ref 26.0–34.0)
HX MEAN CORPUSCULAR VOLUME: 94.5 fL (ref 80.0–100.0)
HX MEAN PLATELET VOLUME: 10.4 fL (ref 9.4–12.4)
HX MONOCYTE AUTOMATED: 7.2 %
HX NEUTROPHIL AUTOMATED: 58 %
HX PLATELET COUNT: 213 10*3/uL (ref 150.0–400.0)
HX RED BLOOD COUNT: 4.19 M/uL — ABNORMAL LOW (ref 4.2–5.9)
HX RED CELL DISTRIBUTION WIDTH SD: 45 fL (ref 35.0–51.0)
HX WHITE BLOOD COUNT: 6.4 10*3/uL (ref 4.5–11.0)

## 2018-01-05 LAB — HX COMPREHENSIVE METABOLIC PANEL
HX ALBUMIN: 3.4 g/dL (ref 3.2–5.0)
HX ALKALINE PHOSPHATASE: 51 U/L (ref 30.0–117.0)
HX ALT: 64 U/L — ABNORMAL HIGH (ref 6.0–55.0)
HX ANION GAP: 4 mmol/L (ref 3.0–11.0)
HX AST: 36 U/L (ref 6.0–40.0)
HX BICARBONATE: 29 mmol/L (ref 21.0–32.0)
HX BUN/CREAT RATIO: 9.6 — ABNORMAL LOW (ref 12.0–20.0)
HX BUN: 10 mg/dL (ref 7.0–23.0)
HX CALCIUM: 8.9 mg/dL (ref 8.5–10.5)
HX CHLORIDE: 107 mmol/L (ref 98.0–110.0)
HX CREATININE: 1.04 mg/dL (ref 0.4–1.3)
HX GLOMERULAR FR AFRICAN AMERICAN: >90
HX GLOMERULAR FR NON AFRICAN AMER: 79
HX GLUCOSE: 134 mg/dL — ABNORMAL HIGH (ref 70.0–110.0)
HX POTASSIUM: 3.7 mmol/L (ref 3.6–5.2)
HX SODIUM: 140 mmol/L (ref 136.0–146.0)
HX TOTAL BILIRUBIN: 0.3 mg/dL (ref 0.2–1.2)
HX TOTAL PROTEIN: 7.4 g/dL (ref 6.0–8.4)

## 2018-01-05 LAB — HX TROPONIN I
HX TROPONIN I: 0.02 ng/mL (ref 0.015–0.045)
HX TROPONIN I: 0.031 ng/mL (ref 0.015–0.045)

## 2018-01-05 LAB — HX CPK ISOENZYMES
HX TOTAL CPK: 270 U/L — ABNORMAL HIGH (ref 44.0–196.0)
HX TOTAL CPK: 245 U/L — ABNORMAL HIGH (ref 44.0–196.0)

## 2018-01-05 LAB — HX PROTHROMBIN TIME
HX INR: 1
HX PROTHROMBIN TIME: 10.3 s (ref 9.4–11.4)

## 2018-01-05 LAB — HX CKMB
HX CKMB: 2 ng/mL (ref <6)
HX CKMB: 2 ng/mL (ref <6)

## 2018-01-05 LAB — HX RELATIVE % INDEX (CKMB)
HX RELATIVE % INDEX (CKMB): <1 (ref <5.0)
HX RELATIVE % INDEX (CKMB): <1 (ref <5.0)

## 2018-01-05 LAB — HX MAGNESIUM: HX MAGNESIUM: 1.9 mg/dL (ref 1.7–2.5)

## 2018-01-06 LAB — UNMAPPED LAB RESULTS: AST/SGOT (EXT): 41 U/L — ABNORMAL HIGH (ref 15–37)

## 2018-01-30 ENCOUNTER — Inpatient Hospital Stay
Admit: 2018-01-30 | Discharge: 2018-01-30 | Disposition: A | Discharge status: Home / Self Care | Attending: Emergency Medicine

## 2018-01-30 DIAGNOSIS — K047 Periapical abscess without sinus: Secondary | ICD-10-CM

## 2018-01-30 MED ORDER — CLINDAMYCIN 150 MG CAP
150 mg | ORAL | Status: AC
Start: 2018-01-30 — End: 2018-01-30
  Administered 2018-01-30: 06:00:00 via ORAL

## 2018-01-30 MED ORDER — CLINDAMYCIN 300 MG CAP
300 mg | ORAL_CAPSULE | Freq: Four times a day (QID) | ORAL | 0 refills | Status: AC
Start: 2018-01-30 — End: 2018-02-06

## 2018-01-30 MED ORDER — ACETAMINOPHEN 325 MG TABLET
325 mg | Freq: Once | ORAL | Status: DC
Start: 2018-01-30 — End: 2018-01-30

## 2018-01-30 MED FILL — MAPAP (ACETAMINOPHEN) 325 MG TABLET: 325 mg | ORAL | Qty: 2

## 2018-01-30 MED FILL — CLINDAMYCIN 150 MG CAP: 150 mg | ORAL | Qty: 2

## 2018-01-30 NOTE — ED Notes (Signed)
Patient presents with upper left dental/jaw pain. Pain woke him from a sleep at 0030. This is the first time the area has caused him pain. Patient states he had a tooth on the upper left area extracted 2.5 weeks ago, and believes the dentist left part of the tooth in his gum because he can see something poking out of his gum. Patient denies any drainage from area. [place] [address] extracted the tooth, believed to be Dr. Regino SchultzeWang.

## 2018-01-30 NOTE — ED Notes (Signed)
Patient/Patient caregiver provided with verbal and paper discharge instructions. Patient/Patient caregiver verbally understands D/C information with no additional questions. Medication changes reviewed if applicable. Patient alert, calm, and cooperative at time of discharge. Patient left department Via Ambulation with steady gait.      VS reassessed 1 hour prior to discharge barring patient refusal of departure VS.      Pt, Provider, family/caregiver (as applicable) in agreement of discharge plan/ follow up if noted.      Patient instructed to follow up/return if needed

## 2018-01-30 NOTE — ED Provider Notes (Signed)
Patient is a 42 year old male past medical history of CAD, chronic dental pain, hypertension who presents with dental pain since yesterday.  Patient saw the dentist 2 and half weeks ago and had majority of his maxillary teeth pulled.  A small fragment of tooth was left in the socket in the right upper maxillary area because there was some minor swelling.  The oral surgeon put him on amoxicillin which he finished 2 days ago.  He was not having any pain until yesterday when it started.  No fevers or chills, no swelling of his face noted.  He does not have chest pain or shortness of breath.   Other than dental pain he has no medical complaints at this time.  Vitals are stable on arrival.           Past Medical History:   Diagnosis Date   ??? CAD (coronary artery disease)     MI x 3   ??? Cardiac revascularization with aortocoronary bypass anastomosis    ??? Chronic dental pain     - multiple teeth remove   ??? Chronic knee pain    ??? Hypertension    ??? Myocardial infarct [facility])    ??? S/P CABG x 1 2007    CABG x 1, 2007, due to congenital heart disease--patient's explanation is that right coronary artery was never in the proper location (perfused left side of heart only) and he had repeat cabage surgery in 2012 to correct this.       Past Surgical History:   Procedure Laterality Date   ??? HX CHOLECYSTECTOMY  08/2010        ??? HX CORONARY ARTERY BYPASS GRAFT  2007, 2012    2007: due to congenital heart disease - patients explanation is that right coronary artery was never in the proper location (perfused left side of heart only) and had repeat CABG surgery in 2012 to correct this   ??? HX OTHER SURGICAL  11/30/2013    TOOTH EXTRACTION         Family History:   Problem Relation Age of Onset   ??? Heart Disease Mother    ??? Cancer Mother         BRAIN CANCER   ??? Diabetes Father    ??? Diabetes Brother    ??? Heart Disease Maternal Grandmother    ??? Dementia Maternal Grandmother    ??? Heart Disease Maternal Grandfather    ??? Heart Disease Paternal  Grandmother    ??? Heart Disease Paternal Grandfather    ??? No Known Problems Other    ??? Heart Disease Brother        Social History     Socioeconomic History   ??? Marital status: SINGLE     Spouse name: Not on file   ??? Number of children: 0   ??? Years of education: Not on file   ??? Highest education level: Not on file   Occupational History     Comment: DISABILITY   Social Needs   ??? Financial resource strain: Not on file   ??? Food insecurity:     Worry: Not on file     Inability: Not on file   ??? Transportation needs:     Medical: Not on file     Non-medical: Not on file   Tobacco Use   ??? Smoking status: Current Some Day Smoker     Packs/day: 0.25     Years: 32.00     Pack years: 8.00  Types: Cigarettes   ??? Smokeless tobacco: Former Neurosurgeon   ??? Tobacco comment: About 40-50 pack years, used to smoke 2 PPD but now down to 2 cigarettes a day   Substance and Sexual Activity   ??? Alcohol use: No   ??? Drug use: No   ??? Sexual activity: Not on file   Lifestyle   ??? Physical activity:     Days per week: Not on file     Minutes per session: Not on file   ??? Stress: Not on file   Relationships   ??? Social connections:     Talks on phone: Not on file     Gets together: Not on file     Attends religious service: Not on file     Active member of club or organization: Not on file     Attends meetings of clubs or organizations: Not on file     Relationship status: Not on file   ??? Intimate partner violence:     Fear of current or ex partner: Not on file     Emotionally abused: Not on file     Physically abused: Not on file     Forced sexual activity: Not on file   Other Topics Concern   ??? Not on file   Social History Narrative    He is from [place] but moved to [place] at age 75.  Has been between [place] and [address]    Lives with wife, married since 2016     No children    Disability secondary to heart disease and syncopal episode    About 40-50 pack years, used to smoke 2 PPD but now down to 2 cigarettes a day    No EtOH    No drug use          ALLERGIES: Tramadol; Codeine; Morphine; Motrin [ibuprofen]; and Toradol [ketorolac]    Review of Systems   Constitutional: Negative for chills and fever.   HENT: Positive for dental problem.    Respiratory: Negative for cough and shortness of breath.    Cardiovascular: Negative for chest pain.   Gastrointestinal: Negative for abdominal pain.       Vitals:    01/30/18 0201   BP: 131/82   Pulse: 91   Resp: 20   Temp: 99.3 ??F (37.4 ??C)   SpO2: 97%   Weight: 120.2 kg (265 lb)   Height: 6\' 4"  (1.93 m)            Physical Exam     General: Awake alert oriented, no acute distress, nontoxic  Head: Normocephalic atraumatic  Eyes:, pupils are equal respond to light appropriately extraocular muscles intact  Oropharynx: Mucous membranes moist, tongue midline, uvula midline  Dental:   Patient is edentulous in the maxillary teeth except for a small tooth fragment in the area of the 1st left upper premolar.   There is surrounding erythema of the mucosa surrounding the tooth fragment.  There is no palpable abscess.  Patient has some mandibular teeth though they are all in poor condition there is no active signs of infection  Neck: Supple, trachea midline   Nose: Unremarkable  Cardiovascular: Regular rate and rhythm S1-S2 noted, no murmur, rub or gallop appreciated  Pulmonary: Clear to auscultation bilaterally no wheezes, rales or rhonchi appreciated  Abdomen: Soft nontender nondistended   Extremities: Full range of motion in all extremities, all 4 extremities are warm and well-perfused  Skin: Intact grossly, no rashes appreciated  Neurologic: No focal neurological deficits appreciated  Psychiatric: Mood appropriate, affect normal, no suicidal homicidal ideation        MDM  Number of Diagnoses or Management Options  Dental infection:        Patient presented for dental pain.  He was just on amoxicillin which she completed 2 days ago.  I do think there is still infection in the tooth fragment.  Will start him on clindamycin.   Discussed probiotics with the patient as I do not want him to get C diff given the recent prolonged antibiotic use.  He will get probiotics tomorrow and start them.  He was given his 1st dose of clindamycin here in the emergency room.  He is allergic to Toradol and ibuprofen and Tylenol was offered.  He is in stable condition will be discharged home  Procedures      NIH Stroke Scale

## 2018-01-30 NOTE — ED Provider Notes (Addendum)
Patient is a 42 year old male past medical history of CAD, chronic dental pain, hypertension who presents with dental pain since yesterday.  Patient saw the dentist 2 and half weeks ago and had majority of his maxillary teeth pulled.  A small fragment of tooth was left in the socket in the right upper maxillary area because there was some minor swelling.  The oral surgeon put him on amoxicillin which he finished 2 days ago.  He was not having any pain until yesterday when it started.  No fevers or chills, no swelling of his face noted.  He does not have chest pain or shortness of breath.   Other than dental pain he has no medical complaints at this time.  Vitals are stable on arrival.           Past Medical History:   Diagnosis Date   ??? CAD (coronary artery disease)     MI x 3   ??? Cardiac revascularization with aortocoronary bypass anastomosis    ??? Chronic dental pain     - multiple teeth remove   ??? Chronic knee pain    ??? Hypertension    ??? Myocardial infarct [facility])    ??? S/P CABG x 1 2007    CABG x 1, 2007, due to congenital heart disease--patient's explanation is that right coronary artery was never in the proper location (perfused left side of heart only) and he had repeat cabage surgery in 2012 to correct this.       Past Surgical History:   Procedure Laterality Date   ??? HX CHOLECYSTECTOMY  08/2010        ??? HX CORONARY ARTERY BYPASS GRAFT  2007, 2012    2007: due to congenital heart disease - patients explanation is that right coronary artery was never in the proper location (perfused left side of heart only) and had repeat CABG surgery in 2012 to correct this   ??? HX OTHER SURGICAL  11/30/2013    TOOTH EXTRACTION         Family History:   Problem Relation Age of Onset   ??? Heart Disease Mother    ??? Cancer Mother         BRAIN CANCER   ??? Diabetes Father    ??? Diabetes Brother    ??? Heart Disease Maternal Grandmother    ??? Dementia Maternal Grandmother    ??? Heart Disease Maternal Grandfather     ??? Heart Disease Paternal Grandmother    ??? Heart Disease Paternal Grandfather    ??? No Known Problems Other    ??? Heart Disease Brother        Social History     Socioeconomic History   ??? Marital status: SINGLE     Spouse name: Not on file   ??? Number of children: 0   ??? Years of education: Not on file   ??? Highest education level: Not on file   Occupational History     Comment: DISABILITY   Social Needs   ??? Financial resource strain: Not on file   ??? Food insecurity:     Worry: Not on file     Inability: Not on file   ??? Transportation needs:     Medical: Not on file     Non-medical: Not on file   Tobacco Use   ??? Smoking status: Current Some Day Smoker     Packs/day: 0.25     Years: 32.00     Pack years: 8.00  Types: Cigarettes   ??? Smokeless tobacco: Former Neurosurgeon   ??? Tobacco comment: About 40-50 pack years, used to smoke 2 PPD but now down to 2 cigarettes a day   Substance and Sexual Activity   ??? Alcohol use: No   ??? Drug use: No   ??? Sexual activity: Not on file   Lifestyle   ??? Physical activity:     Days per week: Not on file     Minutes per session: Not on file   ??? Stress: Not on file   Relationships   ??? Social connections:     Talks on phone: Not on file     Gets together: Not on file     Attends religious service: Not on file     Active member of club or organization: Not on file     Attends meetings of clubs or organizations: Not on file     Relationship status: Not on file   ??? Intimate partner violence:     Fear of current or ex partner: Not on file     Emotionally abused: Not on file     Physically abused: Not on file     Forced sexual activity: Not on file   Other Topics Concern   ??? Not on file   Social History Narrative    He is from [place] but moved to [place] at age 59.  Has been between [place] and [address]    Lives with wife, married since 2016     No children    Disability secondary to heart disease and syncopal episode    About 40-50 pack years, used to smoke 2 PPD but now down to 2 cigarettes a day     No EtOH    No drug use         ALLERGIES: Tramadol; Codeine; Morphine; Motrin [ibuprofen]; and Toradol [ketorolac]    Review of Systems   Constitutional: Negative for chills and fever.   HENT: Positive for dental problem.    Respiratory: Negative for cough and shortness of breath.    Cardiovascular: Negative for chest pain.   Gastrointestinal: Negative for abdominal pain.       Vitals:    01/30/18 0201   BP: 131/82   Pulse: 91   Resp: 20   Temp: 99.3 ??F (37.4 ??C)   SpO2: 97%   Weight: 120.2 kg (265 lb)   Height: 6\' 4"  (1.93 m)            Physical Exam     General: Awake alert oriented, no acute distress, nontoxic  Head: Normocephalic atraumatic  Eyes:, pupils are equal respond to light appropriately extraocular muscles intact  Oropharynx: Mucous membranes moist, tongue midline, uvula midline  Dental:   Patient is edentulous in the maxillary teeth except for a small tooth fragment in the area of the 1st left upper premolar.   There is surrounding erythema of the mucosa surrounding the tooth fragment.  There is no palpable abscess.  Patient has some mandibular teeth though they are all in poor condition there is no active signs of infection  Neck: Supple, trachea midline   Nose: Unremarkable  Cardiovascular: Regular rate and rhythm S1-S2 noted, no murmur, rub or gallop appreciated  Pulmonary: Clear to auscultation bilaterally no wheezes, rales or rhonchi appreciated  Abdomen: Soft nontender nondistended   Extremities: Full range of motion in all extremities, all 4 extremities are warm and well-perfused  Skin: Intact grossly, no rashes appreciated  Neurologic: No focal neurological deficits appreciated  Psychiatric: Mood appropriate, affect normal, no suicidal homicidal ideation        MDM  Number of Diagnoses or Management Options  Dental infection:        Patient presented for dental pain.  He was just on amoxicillin which she completed 2 days ago.  I do think there is still infection in the tooth  fragment.  Will start him on clindamycin.  Discussed probiotics with the patient as I do not want him to get C diff given the recent prolonged antibiotic use.  He will get probiotics tomorrow and start them.  He was given his 1st dose of clindamycin here in the emergency room.  He is allergic to Toradol and ibuprofen and Tylenol was offered.  He is in stable condition will be discharged home  Procedures      NIH Stroke Scale

## 2018-01-30 NOTE — ED Triage Notes (Signed)
Patient presents with upper left dental/jaw pain. Pain woke him from a sleep at 0030. This is the first time the area has caused him pain. Patient states he had a tooth on the upper left area extracted 2.5 weeks ago, and believes the dentist left part of the tooth in his gum because he can see something poking out of his gum. Patient denies any drainage from area. Oral surgeon in [place] on [address] extracted the tooth, believed to be Dr. Wang.

## 2018-04-10 LAB — CMP (EXT)
A/G Ratio (EXT): 1.1
ALT/SGPT (EXT): 88 U/L — ABNORMAL HIGH (ref 0–40)
AST/SGOT (EXT): 56 U/L — ABNORMAL HIGH (ref 0–37)
Albumin (EXT): 4.1 g/dL (ref 3.2–5.2)
Alkaline Phosphatase (EXT): 53 U/L (ref 39–117)
Anion Gap (EXT): 13 meq/L (ref 7–16)
BUN (EXT): 13 mg/dL (ref 6–19)
BUN/CREAT Ratio (EXT): 18.1
Bilirubin, Total (EXT): 0.3 mg/dL (ref 0.0–1.0)
CO2 (EXT): 22 meq/L (ref 21–30)
CalciumCalcium (EXT): 9.4 mg/dL (ref 8.6–10.4)
Chloride (EXT): 102 meq/L (ref 96–108)
Creatinine (EXT): 0.72 mg/dL (ref 0.50–1.30)
Globulin (EXT): 3.6 g/dL — ABNORMAL HIGH (ref 2.0–3.5)
Glucose (EXT): 202 mg/dL — ABNORMAL HIGH (ref 70–99)
Potassium (EXT): 4.6 meq/L (ref 3.3–5.3)
Protein (EXT): 7.7 g/dL (ref 5.9–8.4)
Sodium (EXT): 137 meq/L (ref 133–145)
eGFR - Creat MDRD (EXT): >60 (ref >60)
eGFR - Creat MDRD (EXT): >60 (ref >60)

## 2018-04-10 LAB — INR CARE EVERYWHERE: INR CARE EVERYWHERE: 1 (ref 0.9–1.2)

## 2018-04-10 LAB — APTT CARE EVERYWHERE: APTT CARE EVERYWHERE: 31 s (ref 27–37)

## 2018-04-11 LAB — BMP (EXT)
Anion Gap (EXT): 10 meq/L (ref 7–16)
BUN (EXT): 19 mg/dL (ref 6–19)
BUN/CREAT Ratio (EXT): 23.2
CO2 (EXT): 22 meq/L (ref 21–30)
CalciumCalcium (EXT): 9.1 mg/dL (ref 8.6–10.4)
Chloride (EXT): 105 meq/L (ref 96–108)
Creatinine (EXT): 0.82 mg/dL (ref 0.50–1.30)
Glucose (EXT): 176 mg/dL — ABNORMAL HIGH (ref 70–99)
Potassium (EXT): 4.5 meq/L (ref 3.3–5.3)
Sodium (EXT): 137 meq/L (ref 133–145)
eGFR - Creat MDRD (EXT): >60 (ref >60)
eGFR - Creat MDRD (EXT): >60 (ref >60)

## 2018-04-11 LAB — APTT CARE EVERYWHERE: APTT CARE EVERYWHERE: 39 s — ABNORMAL HIGH (ref 27–37)

## 2018-04-14 LAB — PROTHROMBIN TIME CARE EVERYWHERE: INR CARE EVERYWHERE: 1.1 — NL (ref <1.3)

## 2018-04-15 LAB — UNMAPPED LAB RESULTS

## 2018-04-15 LAB — HEMOGLOBIN A1C: HEMOGLOBIN A1C % (INT/EXT): 7.7 % — ABNORMAL HIGH (ref 4.6–5.6)

## 2018-04-15 LAB — HEMOGLOBIN A1C CARE EVERYWHERE: HEMOGLOBIN A1C CARE EVERYWHERE: 7.7 % — ABNORMAL HIGH (ref 4.6–5.6)

## 2018-04-21 LAB — PROTHROMBIN TIME CARE EVERYWHERE
INR CARE EVERYWHERE: 0.93 — ABNORMAL LOW (ref 1.50–4.00)
PROTHROMBIN TIME CARE EVERYWHERE: 12.4 s — NL (ref 11.5–14.3)

## 2018-04-21 LAB — APTT CARE EVERYWHERE: APTT CARE EVERYWHERE: 26.4 s — NL (ref 22.3–34.6)

## 2018-04-24 ENCOUNTER — Encounter (HOSPITAL_BASED_OUTPATIENT_CLINIC_OR_DEPARTMENT_OTHER): Payer: Self-pay

## 2018-04-24 ENCOUNTER — Emergency Department
Admission: EM | Admit: 2018-04-24 | Discharge: 2018-04-24 | Disposition: A | Discharge status: Home / Self Care | Payer: No Typology Code available for payment source | Source: Intra-hospital | Attending: Emergency Medicine | Admitting: Emergency Medicine

## 2018-04-24 ENCOUNTER — Emergency Department (HOSPITAL_BASED_OUTPATIENT_CLINIC_OR_DEPARTMENT_OTHER): Payer: No Typology Code available for payment source

## 2018-04-24 DIAGNOSIS — Z765 Malingerer [conscious simulation]: Secondary | ICD-10-CM | POA: Diagnosis not present

## 2018-04-24 DIAGNOSIS — R110 Nausea: Secondary | ICD-10-CM | POA: Diagnosis not present

## 2018-04-24 DIAGNOSIS — F17200 Nicotine dependence, unspecified, uncomplicated: Secondary | ICD-10-CM | POA: Diagnosis not present

## 2018-04-24 DIAGNOSIS — R0789 Other chest pain: Secondary | ICD-10-CM | POA: Diagnosis present

## 2018-04-24 DIAGNOSIS — I517 Cardiomegaly: Secondary | ICD-10-CM | POA: Diagnosis not present

## 2018-04-24 DIAGNOSIS — R079 Chest pain, unspecified: Secondary | ICD-10-CM | POA: Diagnosis not present

## 2018-04-24 LAB — CBC, PLATELET & DIFFERENTIAL
ABSOLUTE BASO COUNT: 0 10*3/uL (ref 0.0–0.1)
ABSOLUTE EOSINOPHIL COUNT: 0.1 10*3/uL (ref 0.0–0.8)
ABSOLUTE IMM GRAN COUNT: 0.04 10*3/uL — ABNORMAL HIGH (ref 0.00–0.03)
ABSOLUTE LYMPH COUNT: 2.4 10*3/uL (ref 0.6–5.9)
ABSOLUTE MONO COUNT: 0.5 10*3/uL (ref 0.2–1.4)
ABSOLUTE NEUTROPHIL COUNT: 3.9 10*3/uL (ref 1.6–8.3)
ABSOLUTE NRBC COUNT: 0 10*3/uL (ref 0.0–0.0)
BASOPHIL %: 0.4 % (ref 0.0–1.2)
EOSINOPHIL %: 1.9 % (ref 0.0–7.0)
HEMATOCRIT: 40.2 % (ref 40.1–51.0)
HEMOGLOBIN: 13.3 g/dL — ABNORMAL LOW (ref 13.7–17.5)
IMMATURE GRANULOCYTE %: 0.6 % — ABNORMAL HIGH (ref 0.0–0.4)
LYMPHOCYTE %: 33.9 % (ref 15.0–54.0)
MEAN CORP HGB CONC: 33.1 g/dL (ref 31.0–37.0)
MEAN CORPUSCULAR HGB: 30.3 pg (ref 26.0–34.0)
MEAN CORPUSCULAR VOL: 91.6 fl (ref 80.0–100.0)
MEAN PLATELET VOLUME: 10 fL (ref 8.7–12.5)
MONOCYTE %: 7.3 % (ref 4.0–13.0)
NEUTROPHIL %: 55.9 % (ref 40.0–75.0)
NRBC %: 0 % (ref 0.0–0.0)
PLATELET COUNT: 243 10*3/uL (ref 150–400)
RBC DISTRIBUTION WIDTH STD DEV: 44.4 fL (ref 35.1–46.3)
RED BLOOD CELL COUNT: 4.39 M/uL — ABNORMAL LOW (ref 4.60–6.10)
WHITE BLOOD CELL COUNT: 7 10*3/uL (ref 4.0–11.0)

## 2018-04-24 LAB — BASIC METABOLIC PANEL
ANION GAP: 4 mmol/L — ABNORMAL LOW (ref 5–15)
BUN (UREA NITROGEN): 12 mg/dL (ref 7–18)
CALCIUM: 9.3 mg/dL (ref 8.5–10.1)
CARBON DIOXIDE: 27 mmol/L (ref 21–32)
CHLORIDE: 107 mmol/L (ref 98–107)
CREATININE: 1 mg/dL (ref 0.7–1.2)
ESTIMATED GLOMERULAR FILT RATE: >60 mL/min (ref >60)
Glucose Random: 103 mg/dL (ref 74–160)
POTASSIUM: 3.7 mmol/L (ref 3.5–5.1)
SODIUM: 138 mmol/L (ref 136–145)

## 2018-04-24 LAB — TROPONIN I
TROPONIN I: <0.02 ng/mL (ref 0.00–0.04)
TROPONIN I: <0.02 ng/mL (ref 0.00–0.04)

## 2018-04-24 LAB — HOLD BLUE TOP TUBE

## 2018-04-24 MED ORDER — ASPIRIN 81 MG PO CHEW
324.00 mg | CHEWABLE_TABLET | Freq: Once | ORAL | Status: AC
Start: 2018-04-24 — End: 2018-04-24
  Administered 2018-04-24: 324 mg via ORAL
  Filled 2018-04-24: qty 4

## 2018-04-24 MED ORDER — GENERIC EXTERNAL MEDICATION
1.00 mg/min | Status: DC
Start: ? — End: 2018-04-24

## 2018-04-24 MED ORDER — NITROGLYCERIN 0.4 MG SL SUBL
0.4000 mg | SUBLINGUAL_TABLET | Freq: Once | SUBLINGUAL | Status: AC
  Administered 2018-04-24: 0.4 mg via SUBLINGUAL
  Filled 2018-04-24: qty 1

## 2018-04-24 MED ORDER — METOPROLOL TARTRATE 25 MG PO TABS
25.00 mg | ORAL_TABLET | ORAL | Status: DC
Start: 2018-04-21 — End: 2018-04-24

## 2018-04-24 MED ORDER — EPINEPHRINE PF 1 MG/10ML IJ SOSY
1.00 mg | PREFILLED_SYRINGE | INTRAMUSCULAR | Status: DC
Start: ? — End: 2018-04-24

## 2018-04-24 MED ORDER — ASPIRIN EC 81 MG PO TBEC
324.00 mg | DELAYED_RELEASE_TABLET | ORAL | Status: DC
Start: 2018-04-22 — End: 2018-04-24

## 2018-04-24 MED ORDER — ACETAMINOPHEN 500 MG PO TABS
1000.0000 mg | ORAL_TABLET | Freq: Once | ORAL | Status: AC
Start: 2018-04-24 — End: 2018-04-24
  Administered 2018-04-24: 1000 mg via ORAL
  Filled 2018-04-24: qty 2

## 2018-04-24 MED ORDER — AMIODARONE HCL 450 MG/9ML IV SOLN
300.00 mg | INTRAVENOUS | Status: DC
Start: ? — End: 2018-04-24

## 2018-04-24 MED ORDER — NITROGLYCERIN 0.4 MG SL SUBL
0.40 mg | SUBLINGUAL_TABLET | SUBLINGUAL | Status: DC
Start: ? — End: 2018-04-24

## 2018-04-24 MED ORDER — MORPHINE SULFATE (PF) 2 MG/ML IV SOLN
1.00 mg | INTRAVENOUS | Status: DC
Start: ? — End: 2018-04-24

## 2018-04-24 MED ORDER — ONDANSETRON HCL 40 MG/20ML IJ SOLN
4.00 mg | INTRAMUSCULAR | Status: DC
Start: ? — End: 2018-04-24

## 2018-04-24 MED ORDER — IOHEXOL 350 MG/ML IV SOLN
100.00 mL | INTRAVENOUS | Status: DC
Start: ? — End: 2018-04-24

## 2018-04-24 MED ORDER — ATROPINE SULFATE 0.5 MG/5ML IJ SOSY
0.50 mg | PREFILLED_SYRINGE | INTRAMUSCULAR | Status: DC
Start: ? — End: 2018-04-24

## 2018-04-24 MED ORDER — ATORVASTATIN CALCIUM 20 MG PO TABS
40.00 mg | ORAL_TABLET | ORAL | Status: DC
Start: ? — End: 2018-04-24

## 2018-04-24 MED ORDER — ACETAMINOPHEN 325 MG PO TABS
650.00 mg | ORAL_TABLET | ORAL | Status: DC
Start: ? — End: 2018-04-24

## 2018-04-24 MED ORDER — ALUM & MAG HYDROXIDE-SIMETH 200-200-20 MG/5ML PO SUSP
20.00 mL | ORAL | Status: DC
Start: ? — End: 2018-04-24

## 2018-04-24 MED ORDER — NICOTINE 14 MG/24HR TD PT24
1.00 | MEDICATED_PATCH | TRANSDERMAL | Status: DC
Start: 2018-04-22 — End: 2018-04-24

## 2018-04-24 NOTE — Narrator Note (Signed)
Pt medicated with tylenol. Pt states, "Can't I get anything else. I'm in a lot of pain." Pt also asking for food. Okay to eat per Windell HummingbirdJanneck, MD.

## 2018-04-24 NOTE — Narrator Note (Signed)
MD in to speak to pt

## 2018-04-24 NOTE — Discharge Instructions (Signed)
DIAGNOSIS & TREATMENT:  You were seen in a [facility] Emergency Department for chest pain.   We think your pain is not related to heart damage.  You were treated with aspirin, acetaminophen, and nitroglycerin.     Your laboratory tests were normal. Your chest xray was normal.   Given your normal cardiac CTA a few days ago, we think the likelihood of cardiac ischemia is extremely low.     We are concerned about your repeated ED visits, multiple opiate prescriptions, and leaving after being told you were not getting further opiates. 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 We would advise you follow up with your PCP to discuss pain management.      WHEN SHOULD YOU BE SEEN NEXT?   Please call your doctor and be seen at the next available appointment for follow-up. If you do not have a primary care doctor or would like to transfer your primary care to [facility], please call [phone] to set one up an appointment.    WHEN SHOULD YOU RETURN TO THE ED?  Please contact a care provider or return to the Emergency Department (ED) if you experience new or worsening symptoms, or for any other concerns.

## 2018-04-24 NOTE — Narrator Note (Signed)
Portable xray.

## 2018-04-24 NOTE — Narrator Note (Signed)
Reports no relief of chest pain with nitro. MD aware.

## 2018-04-24 NOTE — ED Triage Note (Signed)
Presents to ED from home. Pt with extensive cardiac hx who c/o sudden onset L sided chest pain that radiates down L arm 20 minutes PTA. Endorses some SOB. Denies fevers/cough/n/v. Pt on daily ASA. No ASA today. No nitro. Pt placed on cardiac/O2 monitors. EKG by PAR.    IV established. Labs sent. Awaiting MD eval.

## 2018-04-24 NOTE — Narrator Note (Signed)
Patient Disposition    Patient education for diagnosis, medications, activity, diet and follow-up.  Patient left ED 5:45 AM.  Patient rep received written instructions.  Interpreter to provide instructions: No    Patient belongings with patient: YES    Have all existing LDAs been addressed? Yes    Have all IV infusions been stopped? N/A    Discharged to: Discharged to home

## 2018-04-24 NOTE — Narrator Note (Signed)
Troponin neg x2. Per chart review decision has been made by MD to hold narcotics. Pt states, "I'm going to get a lawyer and sue you. You are quick to judge. I can't believe you're sending me away with active chest pain."

## 2018-04-24 NOTE — ED Provider Notes (Signed)
See by provider note listed as a "ED Note."

## 2018-04-24 NOTE — Narrator Note (Signed)
Continues to report chest pain; unchanged. Pt then states he is not allergic to morphine.

## 2018-04-24 NOTE — ED Notes (Signed)
[facility] Emergency Medicine Attending Note     History of Present Illness:    Nicholas Salas is a 42 year old male patient with past medical history of coronary artery disease status post bypass x2, with multiple emergency department visits to ED's around the region for chest pain, with negative CT angiogram of the heart on April 21, 2018 at an outside hospital who presents with chest pain.  Patient reports that he was at home at rest, when he suddenly developed left-sided chest pain radiating to his shoulder and his jaw.  He states it is sharp at times.  Nothing makes it better or worse.  He states that his uncle drove him to the emergency department because his uncle is worried about him.  He denies any fever, cough, difficulty breathing, abdominal pain, leg pain or swelling.  He feels a little nauseous, no vomiting or diarrhea.   The patient was seen primarily by me. ED nursing record was reviewed. Prior records as available electronically through the Epic record were reviewed.    Patient's mode of arrival was by Self  Arrival time:     Chief complaint: Chest Pain          Review of Systems:  As per HPI.        Past Medical Hx:  Past Medical History:  No date: Acute myocardial infarction, unspecified site, episode of   care unspecified  No date: CAD (coronary artery disease)  No date: Hypercholesteremia  No date: MI (myocardial infarction) (HCC)  No date: Mitral valve prolapse  Patient Active Problem List:     Drug-seeking behavior   Meds:  No current facility-administered medications for this encounter.      Current Outpatient Medications   Medication Sig   . METOPROLOL TARTRATE OR None Entered   . ASPIRIN 81 MG OR TABS daily   . LIPITOR 20 MG OR TABS daily         Past Surgical Hx:  Past Surgical History:  2007: CABG W/ARTERIAL GRAFT SINGLE ARTERIAL GRAFT  No date: PR MINIMALLY INVASIVE DIRECT CO Allergies:  Review of Patient's Allergies indicates:   Ketorolac trometham*    Hives   Morphine and related     Anaphylaxis   Morphine sulfate-na*        Comment:Pt. States can take dilaudid and other narcotics   Motrin [ibuprofen]      Hives   Social Hx:  Social History    Tobacco Use      Smoking status: Current Every Day Smoker      Smokeless tobacco: Never Used    Alcohol use: Not on file   Immunizations:    There is no immunization history on file for this patient.     Physical Examination:       ED Triage Vitals [04/24/18 0219]   ED Triage Vitals Brief Group      Temp 98.9 F      Pulse 88      Resp 18      BP 129/72      SpO2 98 %      Pain Score 6        GENERAL:   no acute distress, non-toxic   SKIN:  Warm & Dry, no rash  HEAD:  NCAT. Sclerae are anicteric and aninjected  NECK:  Supple. No stridor.  LUNGS:  Clear to auscultation bilaterally. No wheezes, rales, rhonchi.   HEART:  RRR.  No murmurs, rubs, or gallops.   ABDOMEN:  Soft, NTND.  No involuntary guarding or rebound.   EXTREMITIES:  No obvious deformities.  Warm and well perfused.  No edema.   NEUROLOGIC:  Alert and oriented x3; moves all extremities well; speaking in clear fluent sentences. Normal gait without ataxia; nonfocal.   PSYCHIATRIC:  Appropriate for age, time of day, and situation    Medications Given in the ED:  Medications   aspirin chewable tablet 324 mg (324 mg Oral Given 04/24/18 0246)   nitroGLYCERIN (NITROSTAT) SL tablet 0.4 mg (0.4 mg Sublingual Given 04/24/18 0246)   acetaminophen (TYLENOL) tablet 1,000 mg (1,000 mg Oral Given 04/24/18 0311)    Radiology and ECG:  XR Chest Portable   ED Interpretation    No ptx or effusion, unchanged hardware       Electrocardiogram: Normal sinus rhythm with a rate of 86, normal intervals, normal axis, no ischemic changes         Lab Results:  Labs Reviewed   CBC, PLATELET & DIFFERENTIAL - Abnormal; Notable for the following components:       Result Value    RED BLOOD CELL COUNT 4.39 (*)     HEMOGLOBIN 13.3 (*)     IMMATURE GRANULOCYTE % 0.6 (*)     ABSOLUTE IMM GRAN COUNT 0.04 (*)     All other components  within normal limits   BASIC METABOLIC PANEL - Abnormal; Notable for the following components:    ANION GAP 4 (*)     All other components within normal limits   TROPONIN I   HOLD BLUE TOP TUBE   TROPONIN I       Results for orders placed or performed during the hospital encounter of 04/24/18 (from the past 24 hour(s))   CBC, Platelet & Differential    Collection Time: 04/24/18  2:22 AM   Result Value    WHITE BLOOD CELL COUNT 7.0    RED BLOOD CELL COUNT 4.39 (L)    HEMOGLOBIN 13.3 (L)    HEMATOCRIT 40.2    MEAN CORPUSCULAR VOL 91.6    MEAN CORPUSCULAR HGB 30.3    MEAN CORP HGB CONC 33.1    RBC DISTRIBUTION WIDTH STD DEV 44.4    PLATELET COUNT 243    MEAN PLATELET VOLUME 10.0    NEUTROPHIL % 55.9    IMMATURE GRANULOCYTE % 0.6 (H)    LYMPHOCYTE % 33.9    MONOCYTE % 7.3    EOSINOPHIL % 1.9    BASOPHIL % 0.4    NRBC % 0.0    ABSOLUTE NEUTROPHIL COUNT 3.9    ABSOLUTE IMM GRAN COUNT 0.04 (H)    ABSOLUTE LYMPH COUNT 2.4    ABSOLUTE MONO COUNT 0.5    ABSOLUTE EOSINOPHIL COUNT 0.1    ABSOLUTE BASO COUNT 0.0    ABSOLUTE NRBC COUNT 0.0   Basic Metabolic Panel    Collection Time: 04/24/18  2:22 AM   Result Value    SODIUM 138    POTASSIUM 3.7    CHLORIDE 107    CARBON DIOXIDE 27    ANION GAP 4 (L)    CALCIUM 9.3    Glucose Random 103    BUN (UREA NITROGEN) 12    CREATININE 1.0    ESTIMATED GLOMERULAR FILT RATE > 60   Troponin I    Collection Time: 04/24/18  2:22 AM   Result Value    TROPONIN I < 0.02   Hold Blue Top Tube    Collection Time: 04/24/18  2:22  AM   Result Value    HOLD BLUE TOP TUBE RECEIVED IN HEMATOL   Troponin I    Collection Time: 04/24/18  5:06 AM   Result Value    TROPONIN I < 0.02    Vital Signs:  Patient Vitals for the past 720 hrs:   Temp Pulse Resp BP SpO2   04/24/18 0219 98.9 F 88 18 129/72 98 %   04/24/18 0246 -- -- -- 114/64 --   04/24/18 0300 -- 86 10 106/64 96 %   04/24/18 0400 -- 73 (!) 21 109/54 --   04/24/18 0411 -- 76 19 126/67 --          ED Course and Medical Decision-making:  This 42 year old  male patient presents with chest pain.  Given the patient's history and description of symptoms, concern for possible cardiac ischemia.  Differential diagnosis includes pneumothorax, pleural effusion, musculoskeletal pain.  EKG is nonischemic.  Laboratory tests are unremarkable including negative troponin both initial presentation and 3 hours later.  Resolved aspirin, acetaminophen, and nitroglycerin without any change in his pain.  Patient requested something else for his pain, specifically requested morphine.  Patient was noted to have morphine listed on his allergy list.  When told about this, he stated that he was not allergic to morphine.    On review of medical record, patient is noted to have frequent emergency department visits around the region for similar complaints.  In the past year he has been noted to be seen in [place], [place], and [place] with chest pain.  His most recent visit was 3 days ago to [facility], where he received a cardiac CTA, which showed normal blood vessels, and nonspecific bilateral groundglass opacities.  Patient without any symptoms to suggest pneumonia.  Patient left AMA from that visit after he learned he was not going to get any further Dilaudid or morphine.    Review of the prescription monitoring program notes that the patient has had 23 prescriptions for opiates in the last year, from 21 different providers.    I had a discussion with the patient about my concerns for opiate use disorder, specifically his multiple prescriptions, leaving hospital AMA after being told he would not give more opioids, and frequent emergency department visits around the region.  Patient became immediately defensive, and accused me of bias.  When I expressed my concern, he became more angry, and stated that he goes to emergency department because he does not have a primary care provider.  When I suggested that a single primary care provider could be helpful in better managing  his pain, he stated that he did have a primary care appointment.  When asked where that was, he stated at a hospital near his house.  When I pushed in further asked which hospital, he stated [facility].  Of note, the patient lives in [place].    Patient requested discharge after this conversation.  He left prior to receiving his discharge papers.      Reasons to return to the ED were reviewed in detail. All questions were answered. The patient agrees with this plan and disposition.      Condition on Discharge: Stable      Diagnosis/Diagnoses:  Chest pain, unspecified type  Drug-seeking behavior    Medications:   Orders Placed This Encounter      aspirin chewable tablet 324 mg      nitroGLYCERIN (NITROSTAT) SL tablet  0.4 mg      acetaminophen (TYLENOL) tablet 1,000 mg      Nestor Lewandowsky, MD, MPH  04/24/2018   Dept.of Emergency Medicine  [facility]    This Emergency Department patient encounter note was created using voice-recognition software and in real time during the ED visit. Please excuse any typographical errors that have not yet been reviewed and corrected.

## 2018-04-24 NOTE — Narrator Note (Signed)
Pt left without discharge paperwork.

## 2018-04-25 LAB — EKG

## 2018-05-01 LAB — HEMOGLOBIN A1C CARE EVERYWHERE: HEMOGLOBIN A1C CARE EVERYWHERE: 7.9 % — ABNORMAL HIGH (ref 4.1–5.7)

## 2018-05-05 ENCOUNTER — Emergency Department
Admit: 2018-05-05 | Discharge status: Against Medical Advice | Source: Home / Self Care | Attending: Emergency Medicine | Admitting: Emergency Medicine

## 2018-05-05 LAB — HX CBC W/ DIFF
CASE NUMBER: 2019350000260
HX ABSOLUTE NRBC COUNT: 0 10*3/uL
HX HCT: 38.5 % — ABNORMAL LOW (ref 39.0–53.0)
HX HGB: 12.5 g/dL — ABNORMAL LOW (ref 13.0–17.5)
HX MCH: 30.9 pg (ref 26.0–34.0)
HX MCHC: 32.5 g/dL (ref 31.0–37.0)
HX MCV: 95.1 fL (ref 80.0–100.0)
HX MPV: 10.6 fL (ref 9.4–12.4)
HX NRBC PERCENT: 0 %
HX PLATELET: 208 10*3/uL (ref 150.0–400.0)
HX RBC: 4.05 10*6/uL — ABNORMAL LOW (ref 4.2–5.9)
HX RDW-CV: 13.3 % (ref 11.5–14.5)
HX RDW-SD: 46.5 fL (ref 35.0–51.0)
HX WBC: 7.3 10*3/uL (ref 4.0–11.0)

## 2018-05-05 LAB — HX COMPREHENSIVE METABOLIC PANEL
CASE NUMBER: 2019350000260
HX ALBUMIN LVL: 3.6 g/dL (ref 3.2–5.0)
HX ALKALINE PHOSPHATASE: 61 U/L (ref 30.0–117.0)
HX ALT: 110 U/L — ABNORMAL HIGH (ref 6.0–55.0)
HX ANION GAP: 3 (ref 3.0–11.0)
HX AST: 51 U/L — ABNORMAL HIGH (ref 6.0–40.0)
HX BILIRUBIN TOTAL: 0.2 mg/dL (ref 0.2–1.2)
HX BUN: 13 mg/dL (ref 6.0–20.0)
HX CALCIUM LVL: 9.2 mg/dL (ref 8.5–10.5)
HX CHLORIDE: 109 mmol/L (ref 98.0–110.0)
HX CO2: 29 mmol/L (ref 21.0–32.0)
HX CREATININE: 1.06 mg/dL (ref 0.55–1.3)
HX GLUCOSE LVL: 113 mg/dL — ABNORMAL HIGH (ref 70.0–110.0)
HX POTASSIUM LVL: 4.1 mmol/L (ref 3.6–5.2)
HX SODIUM LVL: 141 mmol/L (ref 136.0–146.0)
HX TOTAL PROTEIN: 7.5 g/dL (ref 6.0–8.4)

## 2018-05-05 LAB — HX MAGNESIUM LEVEL
CASE NUMBER: 2019350000260
HX MAGNESIUM LVL: 1.8 mg/dL (ref 1.7–2.5)

## 2018-05-05 LAB — HX .AUTOMATED DIFF
CASE NUMBER: 2019350000260
HX ABSOLUTE BASO COUNT: 0.02 10*3/uL (ref 0.0–0.22)
HX ABSOLUTE EOS COUNT: 0.1 10*3/uL (ref 0.0–0.45)
HX ABSOLUTE LYMPHS COUNT: 2.07 10*3/uL (ref 0.74–5.04)
HX ABSOLUTE MONO COUNT: 0.52 10*3/uL (ref 0.0–1.34)
HX ABSOLUTE NEUTRO COUNT: 4.54 10*3/uL (ref 1.48–7.95)
HX BASOPHILS: 0.3 %
HX EOSINOPHILS: 1.4 %
HX IMMATURE GRANULOCYTES: 0.4 % (ref 0.0–2.0)
HX LYMPHOCYTES: 28.4 %
HX MONOCYTES: 7.1 %
HX NEUTROPHILS: 62.4 %

## 2018-05-05 LAB — HX PT
CASE NUMBER: 2019350000260
HX INR: 1
HX PT: 10.4 s (ref 9.3–11.6)

## 2018-05-05 LAB — HX TROPONIN I
CASE NUMBER: 2019350000260
HX TROPONIN I: <0.015 (ref 0.015–0.045)

## 2018-05-05 LAB — HX PTT
CASE NUMBER: 2019350000260
HX APTT: 25 s (ref 23.0–32.0)

## 2018-05-05 LAB — HX SST GOLD TUBE TO HOLD: CASE NUMBER: 2019350000263

## 2018-05-05 LAB — HX GLOMERULAR FILTRATION RATE (ESTIMATED)
CASE NUMBER: 2019350000260
HX AFN AMER GLOMERULAR FILTRATION RATE: >90
HX NON-AFN AMER GLOMERULAR FILTRATION RATE: 87 mL/min/{1.73_m2}

## 2018-05-05 LAB — HX LIPASE LEVEL
CASE NUMBER: 2019350000260
HX LIPASE LVL: 102 U/L (ref 73.0–393.0)

## 2018-05-05 NOTE — ED Notes (Signed)
Please click on link to see document

## 2018-05-11 ENCOUNTER — Ambulatory Visit: Admitting: Emergency Medicine

## 2018-05-11 ENCOUNTER — Ambulatory Visit: Admitting: Hospitalist

## 2018-05-11 ENCOUNTER — Emergency Department
Admit: 2018-05-11 | Disposition: A | Discharge status: Home / Self Care | Source: Home / Self Care | Attending: Emergency Medicine | Admitting: Emergency Medicine

## 2018-05-11 ENCOUNTER — Emergency Department
Admit: 2018-05-11 | Disposition: A | Discharge status: Home / Self Care | Payer: No Typology Code available for payment source

## 2018-05-11 LAB — HX CBC W/DIFF
HX ABSOLUTE BASO COUNT AUTODIFF: 0.01 10*3/uL (ref 0.0–0.22)
HX ABSOLUTE EOS COUNT AUTODIFF: 0.12 10*3/uL (ref 0.0–0.45)
HX ABSOLUTE LYMPHS COUNT AUTODIFF: 2.1 10*3/uL (ref 0.74–5.04)
HX ABSOLUTE MONO COUNT AUTODIFF: 0.34 10*3/uL (ref 0.0–1.34)
HX ABSOLUTE NEUTRO COUNT AUTODIFF: 3.21 10*3/uL (ref 1.48–7.95)
HX BASOPHIL AUTOMATED: 0.2 %
HX EOSINOPHIL AUTOMATED: 2.1 %
HX HEMATOCRIT: 37.8 % — ABNORMAL LOW (ref 39.0–53.0)
HX HEMOGLOBIN: 12.3 g/dL — ABNORMAL LOW (ref 13.0–17.5)
HX IG AUTOMATED: 0.3 % (ref 0.0–2.0)
HX LYMPHOCYTE AUTOMATED: 36.2 %
HX MEAN CORP.HEMO.CONC.: 32.5 g/dL (ref 31.0–37.0)
HX MEAN CORPUSCULAR HEMOGLOBIN: 31.1 pg (ref 26.0–34.0)
HX MEAN CORPUSCULAR VOLUME: 95.5 fL (ref 80.0–100.0)
HX MEAN PLATELET VOLUME: 10.5 fL (ref 9.4–12.4)
HX MONOCYTE AUTOMATED: 5.9 %
HX NEUTROPHIL AUTOMATED: 55.3 %
HX PLATELET COUNT: 203 10*3/uL (ref 150.0–400.0)
HX RED BLOOD COUNT: 3.96 M/uL — ABNORMAL LOW (ref 4.2–5.9)
HX RED CELL DISTRIBUTION WIDTH SD: 48.5 fL (ref 35.0–51.0)
HX WHITE BLOOD COUNT: 5.8 10*3/uL (ref 4.0–11.0)

## 2018-05-11 LAB — HX RELATIVE % INDEX (CKMB)
HX RELATIVE % INDEX (CKMB): <1 (ref <5.0)
HX RELATIVE % INDEX (CKMB): <1 (ref <5.0)

## 2018-05-11 LAB — HX HEM-ROUTINE
HX BASO #: 0 10*3/uL (ref 0.0–0.2)
HX BASO: 1 %
HX EOSIN #: 0.1 10*3/uL (ref 0.0–0.5)
HX EOSIN: 2 %
HX HCT: 40.5 % (ref 37.0–47.0)
HX HGB: 13 g/dL — ABNORMAL LOW (ref 13.5–16.0)
HX IMMATURE GRANULOCYTE#: 0 10*3/uL (ref 0.0–0.1)
HX IMMATURE GRANULOCYTE: 0 %
HX LYMPH #: 2.2 10*3/uL (ref 1.0–4.0)
HX LYMPH: 34 %
HX MCH: 30.9 pg (ref 26.0–34.0)
HX MCHC: 32.1 g/dL (ref 32.0–36.0)
HX MCV: 96.2 fL (ref 80.0–98.0)
HX MONO #: 0.5 10*3/uL (ref 0.2–0.8)
HX MONO: 8 %
HX MPV: 10.3 fL (ref 9.1–11.7)
HX NEUT #: 3.6 10*3/uL (ref 1.5–7.5)
HX NRBC #: 0 10*3/uL
HX NUCLEATED RBC: 0 %
HX PLT: 217 10*3/uL (ref 150–400)
HX RBC BLOOD COUNT: 4.21 M/uL (ref 4.20–5.50)
HX RDW: 13.7 % (ref 11.5–14.5)
HX SEG NEUT: 56 %
HX WBC: 6.4 10*3/uL (ref 4.0–11.0)

## 2018-05-11 LAB — HX COMPREHENSIVE METABOLIC PANEL
HX ALBUMIN: 3.1 g/dL — ABNORMAL LOW (ref 3.2–5.0)
HX ALKALINE PHOSPHATASE: 57 U/L (ref 30.0–117.0)
HX ALT: 111 U/L — ABNORMAL HIGH (ref 6.0–55.0)
HX ANION GAP: 6 mmol/L (ref 3.0–11.0)
HX AST: 46 U/L — ABNORMAL HIGH (ref 6.0–40.0)
HX BICARBONATE: 25 mmol/L (ref 21.0–32.0)
HX BUN/CREAT RATIO: 12 (ref 12.0–20.0)
HX BUN: 15 mg/dL (ref 7.0–23.0)
HX CALCIUM: 9.1 mg/dL (ref 8.5–10.5)
HX CHLORIDE: 110 mmol/L (ref 98.0–110.0)
HX CREATININE: 1.27 mg/dL (ref 0.4–1.3)
HX GLOMERULAR FR AFRICAN AMERICAN: 76
HX GLOMERULAR FR NON AFRICAN AMER: 63
HX GLUCOSE: 180 mg/dL — ABNORMAL HIGH (ref 70.0–110.0)
HX POTASSIUM: 3.7 mmol/L (ref 3.6–5.2)
HX SODIUM: 141 mmol/L (ref 136.0–146.0)
HX TOTAL BILIRUBIN: 0.2 mg/dL (ref 0.2–1.2)
HX TOTAL PROTEIN: 7 g/dL (ref 6.0–8.4)

## 2018-05-11 LAB — HX TROPONIN I
HX TROPONIN I: <0.015 — ABNORMAL LOW (ref 0.015–0.045)
HX TROPONIN I: 0.02 ng/mL (ref 0.015–0.045)

## 2018-05-11 LAB — HX TRANSFUSION

## 2018-05-11 LAB — HX CHEM-PANELS
HX ANION GAP: 9 (ref 3–14)
HX BLOOD UREA NITROGEN: 13 mg/dL (ref 6–24)
HX CHLORIDE (CL): 107 meq/L (ref 98–110)
HX CO2: 26 meq/L (ref 20–30)
HX CREATININE (CR): 1.15 mg/dL (ref 0.57–1.30)
HX GFR, AFRICAN AMERICAN: 91 mL/min/{1.73_m2}
HX GFR, NON-AFRICAN AMERICAN: 79 mL/min/{1.73_m2}
HX GLUCOSE: 128 mg/dL (ref 70–139)
HX POTASSIUM (K): 3.8 meq/L (ref 3.6–5.1)
HX SODIUM (NA): 142 meq/L (ref 135–145)

## 2018-05-11 LAB — HX MAGNESIUM: HX MAGNESIUM: 2 mg/dL (ref 1.7–2.5)

## 2018-05-11 LAB — HX DIABETES: HX GLUCOSE: 128 mg/dL (ref 70–139)

## 2018-05-11 LAB — HX CHEM-ENZ-FRAC: HX TROPONIN I: <0.01 ng/mL (ref 0.00–0.03)

## 2018-05-11 LAB — HX COAGULATION
HX INR PT: 1 (ref 0.9–1.3)
HX PROTHROMBIN TIME: 11.6 s (ref 9.7–14.0)
HX PTT: 31.7 s (ref 25.7–35.7)

## 2018-05-11 LAB — HX CKMB
HX CKMB: 2 ng/mL (ref <6)
HX CKMB: 2 ng/mL (ref <6)

## 2018-05-11 LAB — HX CPK ISOENZYMES
HX TOTAL CPK: 298 U/L — ABNORMAL HIGH (ref 44.0–196.0)
HX TOTAL CPK: 246 U/L — ABNORMAL HIGH (ref 44.0–196.0)

## 2018-05-11 NOTE — ED Provider Notes (Signed)
[name]  [name], [name]  MRN: [id number]  Age: 42 yrs  Sex: Male  DOB: 10-09-1975  Arrival Date: 05/11/2018  Arrival Time: 03:40  Account#: [id number]  .  Working Diagnosis:  - chest pain  PCP:  .  HPI:  12/22  07:03 This 42 yrs old Black Male presents to ER via Walk In with      am47        complaints of Chest Pain.  07:65 Pt is a 42 year old male PMH MI w/ 3 stents and CABG x 2, IDDM, am47        HLD and HTN who presents with 5 hours left sided constant chest        pain described as sharp that radiates to his arm and jaw. 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 He has associated nausea and        sweating. He denies any injury, recent travel or illness. He        states the pain is similar to his previous MI. .  .  Historical:  - Allergies: Codeine; Motrin; Toradol; Tramadol HCl;  - Home Meds: Aspirin Oral; Lipitor Oral; Metoprolol Tartrate Oral;  - PMHx: CAD; Diabetes - IDDM; High Cholesterol; Hypertension;    mi x3;  - PSHx: CABG;  - Social history: Smoking status: Patient uses tobacco    products, current every day smoker. Patient/guardian denies    using IV drugs.  - Family history: No immediate family members are acutely ill.  - The history from nurses notes was reviewed: and I agree with    what is documented.  .  .  Vital Signs:  03:53 BP 117 / 64 Left Arm Sitting (auto/lg); Pulse 85 Monitor; Resp  eb4        16 Spontaneous; Temp 36.9(O); Pulse Ox 96% on R/A;  07:09 BP 106 / 70; Pulse 70; Resp 20; Pulse Ox 97% on R/A;            dc  08:06 BP 113 / 54 Left Arm Sitting (auto/lg); Pulse 75; Resp 18;      mt25        Pulse Ox 97% on R/A;  08:34 BP 101 / 52; Pulse 70; Resp 16; Temp 37(O); Pulse Ox 100% ;     mt25  .  Glasgow Coma Score:  03:55 Eye Response: spontaneous(4). Verbal Response: oriented(5).     rk10        Motor Response: obeys commands(6). Total: 15.  07:09 Eye Response: spontaneous(4). Verbal Response: oriented(5).     dc         Motor Response: obeys commands(6). Total: 15.  08:33 Eye Response: spontaneous(4). Verbal Response: oriented(5).     mt25        Motor Response: obeys commands(6). Total: 15.  .  .  Name:Trnka, Alinda [name]  [id number]  [id number]  Page 1 of 6  %%PAGE  .  [name]  MRN: [id number]  Age: 57 yrs  Sex: Male  DOB: 09/12/1975  Arrival Date: 05/11/2018  Arrival Time: 03:40  Account#: [id number]  .  Working Diagnosis:  - chest pain  PCP:  .  MDM:  .  12/22  03:50 Order name: PATIENTPING STORY; Complete Time: 05:03             dispa  t  12/22  03:51 Order name: [facility]; Complete Time: 05:03                 dispa  t  12/22  06:50 Order name: BUN (Blood Urea Nitrogen)                           abc  12/22  06:50 Order name: CBC/Diff (With Plt)                                 abc  12/22  06:50 Order name: CR (Creatinine)                                     abc  12/22  06:50 Order name: GLU (Glucose)                                       abc  12/22  06:50 Order name: LYTES (Na, K, Cl, Co2)                              abc  12/22  06:50 Order name: PT (Prothrombin Time With INR)                      abc  12/22  06:50 Order name: PTT                                                 abc  12/22  06:50 Order name: Troponin I                                          abc  12/22  06:50 Order name: Dx Chest Pa + Lateral- 2 View                       abc  12/22  07:47 Order name: GFR, AA                                             dispa  t  12/22  07:47 Order name: GFR, NAA                                            dispa  t  12/22  09:05 Order name: Blood Bank Hold                                     dispa  t  12/22  06:50 Order name: Adult EKG (order using folder); Complete Time: 06:50abc  12/22  06:50 Order name: Cardiac Monitor  abc  12/22  06:50 Order name: EKG (order using folder); Complete Time: 07:11      abc  12/22  06:50 Order name: Pulse Oximetry Continuous                            abc  12/22  07:44 Order name: Adult EKG (order using folder); Complete Time: 07:44abc  12/22  07:44 Order name: EKG (order using folder)                            abc  .  [name]  [id number]  [id number]  Page 2 of 6  %%PAGE  .  Name: [name], [name]  MRN: [id number]  Age: 48 yrs  Sex: Male  DOB: [birth year]  Arrival Date: 05/11/2018  Arrival Time: 03:40  Account#: [id number]  .  Working Diagnosis:  - chest pain  PCP:  .  [name]  Dispensed Medications:  07:18 Drug: Aspirin 81mg  x 4 324 mg Route: PO;                        mt25  07:51 CANCELLED (Other Intervention Used): Nitroglycerin Tablet 0.4mg  abc        0.4 mg Sublingual every 5 minutes; do not exceed 3 tablets in        15 minutes; call md if still symptomatic after 3 rd dose x3  .  [name]  Radiology Orders:  Order Name: Dx Chest Pa + Lateral- 2 View; Last Status:    Returned; Time: 05/11/18 06:50; By: Maren Beach; For: abc; Order    Method: Electronic; Notes: Bed Name: A18  Attending Notes:  07:41 Attestation: Assessment and care plan reviewed with             abc        resident/midlevel provider. See their note for details.        Physician Assistant's history reviewed, patient interviewed and        examined. Attending HPI: HPI: This is a 42 year old male with a        history of hypertension, hyperlipidemia, diabetes, and almost        coronary artery status post coronary artery bypass graft who        presents with chest pain. Patient reports at approximately 2:00        in the morning, one drop of her complaint of acute onset of        sharp anterior chest pain radiating to his left shoulder. The        lightheadedness dizziness syncope near-syncope shortness of        breath nausea vomiting or diaphoresis. He's had similar        symptoms frequently in the past. Symptoms did not improve with        rest worse with exertion. Attending ROS Constitutional:        Negative for fever, ENT: Negative for sore throat, Neck:        Negative for pain  at rest, Cardiovascular: Positive for chest        pain, Negative for orthopnea, palpitations, Respiratory:        Negative for cough, dyspnea on exertion, shortness of breath,        Abdomen/GI: Negative for abdominal pain, nausea, vomiting, GU:        Negative for urinary symptoms, MS/Extremity: Negative for  swelling, Skin: Negative for rash, Neuro: Negative for        dizziness, headache, near syncope, Allergy/Immunology: Negative        for allergies, Endocrine: Negative for polyuria. Attending        Exam: Constitutional: Awake, alert and comfortable. Eyes: PERL        without icterus. Neck: supple wihtout lymphadenopathy        Respiratory: Lungs clear to auscultation bilaterally without        rales/ronchi/wheezes. Cardiovascular: regular rate and rhythm        wihtout murmur. Abdomen/GI: soft and nontender Skin: warm and        dry without rash or jaundice. MS/ Extremity: no edema. Neck:        Exam negative for jvd. Respiratory: Neuro: Mentation: lucid. I  .  Name:Montemurro, Tryone  [id number]  [id number]  Page 3 of 6  %%PAGE  .  Name: Connery, Shiffler  MRN: [id number]  Age: 33 yrs  Sex: Male  DOB: [birth year]  Arrival Date: 05/11/2018  Arrival Time: 03:40  Account#: [id number]  .  Working Diagnosis:  - chest pain  PCP:  .        have reviewed the Nurses Notes, Old Records in: [facility].        [employer].  07:47 ED Course: This is a well-appearing nontoxic 42 year old male   abc        with a history of hyperlipidemia hypertension diabetes, who        presents for evaluation of chest pain. Electrocardiogram was        obtained this was interpreted by me, it is a normal sinus        rhythm, no ST elevations, no T wave abnormalities are present.        It is unchanged compared with prior electrocardiogram. The        patient has had a history of recurrent and chronic chest pain.        He also has a history of an anom,alous right coronary artery,        but was initially treated with a coronary artery  bypass graft        RIMA to RCA, approximately 10 years ago, which was later found        to be atretic, and he was treated at time with a reimplantation        surgery. He was seen in this hospital in January of this year,        and cardiac CT at that time revealed no evidence of        atherosclerosis, a coronary calcium score of zero, and no        evidence of coronary artery stenosis. Here today, he is given        aspirin. His troponin (drawn approx 3 hour after presentation)        is normal. Chest XRay obtained visualized interpreted by me        there is cardiomegaly no pulmnary edema no pleural effusion.  07:56 ED Course: low suspicion that his chest pain is from acute      abc        coronary syndrome or ischemic heart disease. discharge home        with acetaminophen for pain, f/u wiith his PCP and primary        cardiologist tomorrow, and return if worse. My Working        Impression: chest pain.  Attending chart complete and        electronically signed: Jari Pigg B. Colangelo MD, FACEP, ext.        [phone].  [name]  Disposition Summary:  05/11/18 08:11  Discharge Ordered        Location: Home -                                                abc        Problem: new                                                    abc        Symptoms: have improved                                         abc        Condition: Stable                                               abc        Diagnosis          - chest pain                                                  abc        Followup:                                                       abc          - With: Private Physician          - When: Tomorrow          - Reason: Continuance of care        Discharge Instructions:  .  Name:Kindall, Demetry  [id number]  [id number]  Page 4 of 6  %%PAGE  .  Name: Cace, Osorto  MRN: [id number]  Age: 56 yrs  Sex: Male  DOB: Jun 22, 1975  Arrival Date: 05/11/2018  Arrival Time: 03:40  Account#: [id number]  .  Working Diagnosis:  -  chest pain  PCP:  .          - Discharge Summary Sheet                                     abc          - CHEST PAIN, Uncertain Cause  abc        Forms:          - Medication Reconciliation Form                              abc  Signatures:  Barbette Merino                          MD   jms  Dispatcher, [employer]                          dispa  Trilby Drummer                            RN   dc  Konrad Penta                     MD   abc  Jolayne Panther                           RN   [address] [place]                         Reg  bb25  [place], [place]                           PA-C am47  Boykin Reaper, March                        RN   mt25  .  Corrections: (The following items were deleted from the chart)  07:50 07:47 ED Course: This is a well-appearing nontoxic 42 year old  abc        male with a history of hyperlipidemia hypertension diabetes,        who presents for evaluation of chest pain. Electrocardiogram        was obtained this was interpreted by me, it is a normal sinus        rhythm, no ST elevations, no T wave abnormalities are present.        It is unchanged compared with prior electrocardiogram. The        patient has had a history of recurrent and chronic chest pain.        He also has a history of an anom,alous right coronary artery,        but was initially treated with a coronary artery bypass graft        RIMA to RCA, approximately 10 years ago, which was later found        to be atretic, and he was treated at time with a reimplantation        surgery. He was seen in this hospital in January of this year,        and cardiac CT at that time revealed no evidence of        atherosclerosis, abc  07:51 07:41 Nitroglycerin Tablet 0.4mg  0.4 mg Sublingual every 5      abc        minutes; do not exceed 3 tablets in 15 minutes; call md if        still symptomatic after 3 rd dose x3 ordered. abc  07:51 07:47 ED Course: This is  a well-appearing nontoxic 42 year old  abc         male with a history of hyperlipidemia hypertension diabetes,        who presents for evaluation of chest pain. Electrocardiogram        was obtained this was interpreted by me, it is a normal sinus        rhythm, no ST elevations, no T wave abnormalities are present.        It is unchanged compared with prior electrocardiogram. The        patient has had a history of recurrent and chronic chest pain.        He also has a history of an anom,alous right coronary artery,        but was initially treated with a coronary artery bypass graft        RIMA to RCA, approximately 10 years ago, which was later found  .  Name:Cathey, Kaleo  [id number]  [id number]  Page 5 of 6  %%PAGE  .  Name: Kaylum, Shrum  MRN: [id number]  Age: 89 yrs  Sex: Male  DOB: 29-Jan-1976  Arrival Date: 05/11/2018  Arrival Time: 03:40  Account#: [id number]  .  Working Diagnosis:  - chest pain  PCP:  .        to be atretic, and he was treated at time with a reimplantation        surgery. He was seen in this hospital in January of this year,        and cardiac CT at that time revealed no evidence of        atherosclerosis, a coronary calcium score of zero, and no        evidence of coronary artery stenosis. abc  07:56 07:47 ED Course: This is a well-appearing nontoxic 42 year old  abc        male with a history of hyperlipidemia hypertension diabetes,        who presents for evaluation of chest pain. Electrocardiogram        was obtained this was interpreted by me, it is a normal sinus        rhythm, no ST elevations, no T wave abnormalities are present.        It is unchanged compared with prior electrocardiogram. The        patient has had a history of recurrent and chronic chest pain.        He also has a history of an anom,alous right coronary artery,        but was initially treated with a coronary artery bypass graft        RIMA to RCA, approximately 10 years ago, which was later found        to be atretic, and he was treated at time with  a reimplantation        surgery. He was seen in this hospital in January of this year,        and cardiac CT at that time revealed no evidence of        atherosclerosis, a coronary calcium score of zero, and no        evidence of coronary artery stenosis. Here today, he is given        aspirin. His troponin (drawn abc  .  Document is preliminary until electronically or manually signed by the atte  nding physician  .  .  .  .  .  .  .  .  .  .  .  .  .  .  .  .  .  .  .  [name]  Name:Shipton, Keeven  [id number]  [id number]  Page 6 of 6  .  %%END

## 2018-05-11 NOTE — ED Provider Notes (Signed)
[name]  Name: Zaeem, Kandel  MRN: [id number]  Age: 42 yrs  Sex: Male  DOB: 1975/10/16  Arrival Date: 05/11/2018  Arrival Time: 03:40  Account#: [id number]  Bed A18  PCP:  Chief Complaint: Chest Pain  .  Presentation:  12/22  03:53 Presenting complaint: Patient states: Pt says that he is having rk10        CP. Pressure. Started 20 min PTA. Radiates down L arm and jaw.        Reports cardiac history w/ CABG.  03:53 Method Of Arrival: Walk In                                      rk10  03:53 Acuity: Adult 3                                                 rk10  .  Historical:  - Allergies:  03:55 Codeine;                                                        rk10  03:55 Motrin;                                                         rk10  03:55 Toradol;                                                        rk10  03:55 Tramadol HCl;                                                   rk10  - Home Meds:  03:55 Aspirin Oral [Active]; Lipitor Oral [Active]; Metoprolol        rk10        Tartrate Oral [Active];  - PMHx:  03:55 CAD; Diabetes - IDDM; High Cholesterol; Hypertension; mi x3;    rk10  - PSHx:  03:55 CABG;                                                           rk10  .  - Social history: Smoking status: Patient uses tobacco    products, current every day smoker. Patient/guardian denies    using IV drugs.  - Family history: No immediate family members are acutely ill.  - The history from nurses notes was reviewed: and I agree with    what is  documented.  .  .  Screening:  03:55 SEPSIS SCREENING SIRS Criteria (> = 2) No. Safety screen:       rk10        Patient feels safe. Suicide (ED Safe) Screening: In the past        two weeks have you felt down, depressed or hopelessquestion No.        Suicidal Thoughts: Over the past two weeks patient DENIES        thoughts of killing self. Denies prior suicide attempts. Fall        Risk None identified. Exposure Risk/Travel Screening: No. The        patient reports that they have  not travelled outside out of the        [place] in the past 30 days.  .  Vital Signs:  03:53 BP 117 / 64 Left Arm Sitting (auto/lg); Pulse 85 Monitor; Resp  eb4  .  Name:Lascala, Martice  [id number]  [id number]  Page 1 of 3  %%PAGE  .  Name: Tollie, [place]  MRN: [id number]  Age: 53 yrs  Sex: Male  DOB: 03/04/1976  Arrival Date: 05/11/2018  Arrival Time: 03:40  Account#: [id number]  Bed A18  PCP:  Chief Complaint: Chest Pain  .        16 Spontaneous; Temp 36.9(O); Pulse Ox 96% on R/A;  07:09 BP 106 / 70; Pulse 70; Resp 20; Pulse Ox 97% on R/A;            dc  08:06 BP 113 / 54 Left Arm Sitting (auto/lg); Pulse 75; Resp 18;      mt25        Pulse Ox 97% on R/A;  08:34 BP 101 / 52; Pulse 70; Resp 16; Temp 37(O); Pulse Ox 100% ;     mt25  .  Glasgow Coma Score:  03:55 Eye Response: spontaneous(4). Verbal Response: oriented(5).     rk10        Motor Response: obeys commands(6). Total: 15.  07:09 Eye Response: spontaneous(4). Verbal Response: oriented(5).     dc        Motor Response: obeys commands(6). Total: 15.  08:33 Eye Response: spontaneous(4). Verbal Response: oriented(5).     mt25        Motor Response: obeys commands(6). Total: 15.  .  Triage Assessment:  03:55 General: Appears in no apparent distress, obese, well           rk10        nourished, Behavior is cooperative, flat. Pain: Complains of        pain in chest. Cardiovascular: Chest pain is described as        diffuse.  .  Assessment:  07:14 General: Chest pain since 2am per pt. Does not change with      mt25        respiration..  08:33 General: Pt ate two sandwiches. Post nitro, pt reports no       mt25        change in chest pain, now headache..  08:35 General: NSR on monitor..                                       mt25  .  Observations:  03:40 Patient arrived in ED.  bb25  03:54 Triage Completed.                                               rk10  06:15 Registration completed.                                          bb25  06:15 Patient Visited ByReginia Forts  06:54 Patient Visited By: Joannie Springs  07:09 Patient Visited By: Carmon Ginsberg  07:10 Patient Visited By: Waynard Edwards  .  Procedure:  03:58 EKG done. (by ED staff). Old EKG Obtained Reviewed By: Kerry Dory MD.  07:14 BUN (Blood Urea Nitrogen) Sent.                                 mt25  07:14 CBC/Diff (With Plt) Sent.                                       mt25  07:14 CR (Creatinine) Sent.                                           mt25  .  Name:Krell, Ryne  [id number]  [id number]  Page 2 of 3  %%PAGE  .  Name: Oakes, Mccready  MRN: [id number]  Age: 53 yrs  Sex: Male  DOB: December 19, 1975  Arrival Date: 05/11/2018  Arrival Time: 03:40  Account#: [id number]  Bed A18  PCP:  Chief Complaint: Chest Pain  .  07:14 GLU (Glucose) Sent.                                             mt25  07:14 LYTES (Na, K, Cl, Co2) Sent.                                    mt25  07:14 PT (Prothrombin Time With INR) Sent.                            mt25  07:14 PTT Sent.  mt25  07:14 Troponin I Sent.                                                mt25  07:14 Inserted peripheral IV: 20 gauge in right antecubital area.     mt25  .  Dispensed Medications:  07:18 Drug: Aspirin 81mg  x 4 324 mg Route: PO;                        mt25  07:51 CANCELLED (Other Intervention Used): Nitroglycerin Tablet 0.4mg  abc        0.4 mg Sublingual every 5 minutes; do not exceed 3 tablets in        15 minutes; call md if still symptomatic after 3 rd dose x3  .  [name]  Interventions:  05:41 Demo Sheet Scanned into Chart                                   cl24  05:41 Demo Sheet Scanned into Chart                                   cl24  07:09 Armband on Placed in gown Bed in low position. Cardiac monitor  dc         on. Pulse ox on. NIBP on.  07:53 ECG/EKG Scanned into Chart                                      hi  .  Outcome:  08:11 Discharge ordered by MD.                                        abc  08:34 Discharged to home. Condition: stable.                          mt25  08:36 Patient left the ED.                                            mt25  .  Signatures:  Trilby Drummer                            RN   dc  Konrad Penta                     MD   Erskine Emery                         CCT  eb4  Jolayne Panther                           RN   rk10  Myrtie Hawk  Sec  cl24  Nickolas Madrid                        CCT  hi  [id number] [place]                         Reg  bb25  [place], [place]                           PA-C am47  Boykin Reaper, March                        RN   mt25  .  .  .  .  .  .  .  Name:Jardin, Ruger  [id number]  [id number]  Page 3 of 3  .  %%END

## 2018-05-19 LAB — BMP (EXT)
Anion Gap (EXT): 12 mmol/L (ref 7–17)
BUN (EXT): 11 mg/dL (ref 6–23)
CO2 (EXT): 24 mmol/L (ref 22–31)
CalciumCalcium (EXT): 9.5 mg/dL (ref 8.8–10.7)
Chloride (EXT): 103 mmol/L (ref 98–107)
Creatinine (EXT): 1.04 mg/dL (ref 0.50–1.20)
GFR Estimated (Calc) (EXT): 89 mL/min/{1.73_m2} (ref >59)
Glucose (EXT): 108 mg/dL — ABNORMAL HIGH (ref 70–100)
Potassium (EXT): 4.4 mmol/L (ref 3.4–5.1)
Sodium (EXT): 139 mmol/L (ref 136–145)

## 2018-06-04 LAB — PROTHROMBIN TIME CARE EVERYWHERE
INR CARE EVERYWHERE: 1.02 — NL
PROTHROMBIN TIME CARE EVERYWHERE: 10.9 s — NL (ref 9.7–11.7)

## 2018-06-04 LAB — APTT CARE EVERYWHERE: APTT CARE EVERYWHERE: 25.6 s — NL (ref 24.9–32.3)

## 2018-07-30 DIAGNOSIS — E119 Type 2 diabetes mellitus without complications: Secondary | ICD-10-CM | POA: Insufficient documentation

## 2018-07-30 LAB — PROTHROMBIN TIME CARE EVERYWHERE: INR CARE EVERYWHERE: 1 — NL (ref 0.9–1.1)

## 2018-07-30 LAB — APTT CARE EVERYWHERE: APTT CARE EVERYWHERE: 25 s — NL (ref 22–36)

## 2018-07-31 LAB — CMP (EXT)
ALT/SGPT (EXT): 67 U/L — ABNORMAL HIGH (ref 10–50)
AST/SGOT (EXT): 40 U/L (ref 15–41)
Albumin (EXT): 3.6 g/dL (ref 3.5–5.2)
Alkaline Phosphatase (EXT): 55 U/L (ref 32–100)
Anion Gap (EXT): 11 mmol/L (ref 3–17)
BUN (EXT): 10 mg/dL (ref 6–20)
Bilirubin, Total (EXT): <0.2 mg/dL (ref 0.0–1.2)
CO2 (EXT): 26 mmol/L (ref 22–32)
CalciumCalcium (EXT): 9 mg/dL (ref 8.9–10.3)
Chloride (EXT): 103 mmol/L (ref 98–107)
Creatinine (EXT): 0.93 mg/dL (ref 0.6–1.3)
GFR Estimated (Calc) (EXT): 102 mL/min/{1.73_m2} (ref 60–128)
Globulin (EXT): 3 g/dL (ref 1.9–4.1)
Glucose (EXT): 145 mg/dL — ABNORMAL HIGH (ref 65–99)
Potassium (EXT): 4 mmol/L (ref 3.6–5.1)
Protein (EXT): 6.6 g/dL (ref 6.1–8.1)
Sodium (EXT): 140 mmol/L (ref 136–145)

## 2018-07-31 LAB — LIPID PROFILE (EXT)
Cholesterol (EXT): 182 mg/dL (ref 0–200)
HDL Cholesterol (EXT): 28 mg/dL — ABNORMAL LOW (ref >39)
LDL Cholesterol (EXT): 101 mg/dL (ref 40–130)
NON HDL Cholesterol (EXT): 154 mg/dL
Risk Factor (EXT): 6.5 — ABNORMAL HIGH (ref <5)
Triglycerides (EXT): 267 mg/dL — ABNORMAL HIGH (ref 0–150)

## 2018-07-31 LAB — BMP (EXT)
Anion Gap (EXT): 9 mmol/L (ref 3–17)
BUN (EXT): 7 mg/dL (ref 6–20)
CO2 (EXT): 28 mmol/L (ref 22–32)
CalciumCalcium (EXT): 9.4 mg/dL (ref 8.9–10.3)
Chloride (EXT): 104 mmol/L (ref 98–107)
Creatinine (EXT): 0.9 mg/dL (ref 0.6–1.3)
GFR Estimated (Calc) (EXT): 106 mL/min/{1.73_m2} (ref 60–128)
Glucose (EXT): 98 mg/dL (ref 65–99)
Potassium (EXT): 4.1 mmol/L (ref 3.6–5.1)
Sodium (EXT): 141 mmol/L (ref 136–145)

## 2018-08-01 LAB — HEMOGLOBIN A1C
Estimated Average Glucose mg/dL (INT/EXT): 148 mg/dL
HEMOGLOBIN A1C % (INT/EXT): 6.8 % — ABNORMAL HIGH (ref 4.3–6.1)

## 2018-08-01 LAB — HEMOGLOBIN A1C CARE EVERYWHERE
HEMOGLOBIN A1C CARE EVERYWHERE: 6.8 % — ABNORMAL HIGH (ref 4.3–6.1)
MEAN BLOOD GLUCOSE CARE EVERYWHERE: 148 mg/dL — NL

## 2018-08-07 LAB — BMP (EXT)
Anion Gap (EXT): 13 mmol/L (ref 3–17)
BUN (EXT): 11 mg/dL (ref 8–25)
CO2 (EXT): 25 mmol/L (ref 23–32)
CalciumCalcium (EXT): 9.5 mg/dL (ref 8.5–10.5)
Chloride (EXT): 99 mmol/L (ref 98–108)
Creatinine (EXT): 0.95 mg/dL (ref 0.60–1.50)
GFR Estimated (Calc) (EXT): 100 mL/min/{1.73_m2} (ref >59)
Glucose (EXT): 123 mg/dL — ABNORMAL HIGH (ref 70–110)
Potassium (EXT): 4.6 mmol/L (ref 3.4–5.0)
Sodium (EXT): 137 mmol/L (ref 135–145)

## 2018-08-11 LAB — CBC, EXTERNAL
ABS BASOPHILS, EXTERNAL: 0
ABS BASOPHILS, EXTERNAL: 0 NA
ABS EOSINOPHILS, EXTERNAL: 0.2
ABS EOSINOPHILS, EXTERNAL: 0.2 NA
ABS IMM GRANS, EXTERNAL: 0.1
ABS IMM GRANS, EXTERNAL: 0.1 NA
ABS LYMPHOCYTES, EXTERNAL: 2.2
ABS LYMPHOCYTES, EXTERNAL: 2.2 NA
ABS MONOCYTES, EXTERNAL: 0.6
ABS MONOCYTES, EXTERNAL: 0.6 NA
ABS NEUTROPHILS, EXTERNAL: 6.8
ABS NEUTROPHILS, EXTERNAL: 6.8 NA
BASOPHILS, EXTERNAL: 0
BASOPHILS, EXTERNAL: 0 NA
EOSINOPHILS, EXTERNAL: 2
EOSINOPHILS, EXTERNAL: 2 NA
HGB, EXTERNAL: 12.2
Hct, External: 37.9
Hematocrit, External: 37.9 NA
Hemoglobin, EXTERNAL: 12.2 NA
LYMPHOCYTES, EXTERNAL: 22
LYMPHOCYTES, EXTERNAL: 22 NA
MCH, EXTERNAL: 29.7
MCH, EXTERNAL: 29.7 NA
MCHC, EXTERNAL: 32.2
MCHC, EXTERNAL: 32.2 NA
MCV, EXTERNAL: 92.2
MCV, EXTERNAL: 92.2 NA
MONOCYTES, EXTERNAL: 6
MONOCYTES, EXTERNAL: 6 NA
MPV, EXTERNAL: 10.4
MPV, EXTERNAL: 10.4 NA
NEUTROPHILS, EXTERNAL: 68
NEUTROPHILS, EXTERNAL: 68 NA
Platelets, EXTERNAL: 272
Platelets, External: 272 NA
RBC, EXTERNAL: 4.11
RBC, External: 4.11 NA
RDW, EXTERNAL: 12.6
RDW, EXTERNAL: 12.6 NA
WBC, EXTERNAL: 10
WBC, EXTERNAL: 10 NA

## 2018-08-11 LAB — CMP, EXTERNAL
A/G RATIO, EXTERNAL: 1
A/G RATIO, EXTERNAL: 1 NA
ALBUMIN, EXTERNAL: 4.1
ALBUMIN, EXTERNAL: 4.1 NA
ALK PHOS, EXTERNAL: 60
ALK PHOS, EXTERNAL: 60 NA
ANION GAP, EXTERNAL: 3
ANION GAP, EXTERNAL: 3 NA
BILI TOTAL, EXTERNAL: 0.7
BUN, EXTERNAL: 4
BUN, EXTERNAL: 4 NA
CO2, EXTERNAL: 32
CO2, External: 32 NA
CREATININE SER, EXTERNAL: 0.9
CREATININE SER, EXTERNAL: 0.9 NA
Calcium, EXTERNAL: 9
Calcium, EXTERNAL: 9 NA
Chloride, EXTERNAL: 101
Chloride, EXTERNAL: 101 NA
GLOBULIN, EXTERNAL: 4.1
GLOBULIN, EXTERNAL: 4.1 NA
Glucose SER, EXTERNAL: 130
Glucose, EXTERNAL: 130 NA
Potassium, EXTERNAL: 3.7
Potassium, External: 3.7 NA
Protein TOT, EXTERNAL: 8.2
Protein, Total, EXTERNAL: 8.2 NA
SGOT (AST), EXTERNAL: 45
SGOT (AST), EXTERNAL: 45 NA
SGPT (ALT), EXTERNAL: 49
SGPT (ALT), EXTERNAL: 49 NA
Sodium, EXTERNAL: 136
Sodium, External: 136 NA
Total Bilirubin, EXTERNAL: 0.7 NA

## 2018-08-11 LAB — AMB EXT ESR: SED RATE, EXTERNAL: 87

## 2018-08-11 LAB — PRO-BNP, SERUM, EXTERNAL: Pro-BNP, serum, External: 132

## 2018-08-11 LAB — PROLACTIN, SERUM, EXTERNAL: Prolactin, serum, External: 0.09

## 2018-08-11 LAB — TROPONIN, SERUM, EXTERNAL: Troponin, serum, External: <0.01

## 2018-08-11 LAB — LACTATE DEHYDROGENASE, SERUM, EXTERNAL: LDH, EXTERNAL: 1.6

## 2018-08-12 LAB — CBC, EXTERNAL
HGB, EXTERNAL: 12.2
Hct, External: 37.7
Hematocrit, External: 37.7 NA
Hemoglobin, EXTERNAL: 12.2 NA
MCH, EXTERNAL: 29.5
MCH, EXTERNAL: 29.5 NA
MCHC, EXTERNAL: 32.4
MCHC, EXTERNAL: 32.4 NA
MCV, EXTERNAL: 91.1
MCV, EXTERNAL: 91.1 NA
MPV, EXTERNAL: 10.4
MPV, EXTERNAL: 10.4 NA
Platelets, EXTERNAL: 291
Platelets, External: 291 NA
RBC, EXTERNAL: 4.14
RBC, External: 4.14 NA
RDW, EXTERNAL: 12.3
RDW, EXTERNAL: 12.3 NA
WBC, EXTERNAL: 13.4
WBC, EXTERNAL: 13.4 NA

## 2018-08-12 LAB — LEGIONELLA AG, URINE
LEGIONELLA AG, URINE: NOT DETECTED
Legionella Antigen, Urine: NOT DETECTED

## 2018-08-12 LAB — PROLACTIN, SERUM, EXTERNAL: Prolactin, serum, External: 4.54

## 2018-08-13 LAB — BMP, EXTERNAL
ANION GAP, EXTERNAL: 1
ANION GAP, EXTERNAL: 1 NA
BUN, EXTERNAL: 8
BUN, EXTERNAL: 8 NA
CO2, EXTERNAL: 34
CO2, External: 34 NA
CREATININE SER, EXTERNAL: 0.9
CREATININE SER, EXTERNAL: 0.9 NA
Chloride, EXTERNAL: 103
Chloride, EXTERNAL: 103 NA
Glucose SER, EXTERNAL: 145
Glucose, EXTERNAL: 145 NA
Potassium, EXTERNAL: 3.7
Potassium, External: 3.7 NA
Sodium, EXTERNAL: 138
Sodium, External: 138 NA

## 2018-08-13 LAB — CBC, EXTERNAL
HGB, EXTERNAL: 11.4
Hct, External: 35.6
Hematocrit, External: 35.6 NA
Hemoglobin, EXTERNAL: 11.4 NA
MCH, EXTERNAL: 30.1
MCH, EXTERNAL: 30.1 NA
MCHC, EXTERNAL: 32
MCHC, EXTERNAL: 32 NA
MCV, EXTERNAL: 93.9
MCV, EXTERNAL: 93.9 NA
MPV, EXTERNAL: 10.1
MPV, EXTERNAL: 10.1 NA
Platelets, EXTERNAL: 276
Platelets, External: 276 NA
RBC, EXTERNAL: 3.79
RBC, External: 3.79 NA
RDW, EXTERNAL: 12.3
RDW, EXTERNAL: 12.3 NA
WBC, EXTERNAL: 13.1
WBC, EXTERNAL: 13.1 NA

## 2018-08-13 LAB — MRSA SCREENING CULTURE: MRSA CULTURE: NEGATIVE

## 2018-08-14 LAB — CBC, EXTERNAL
HGB, EXTERNAL: 11.1
Hct, External: 35.2
Hematocrit, External: 35.2 NA
Hemoglobin, EXTERNAL: 11.1 NA
MCH, EXTERNAL: 29.7
MCH, EXTERNAL: 29.7 NA
MCHC, EXTERNAL: 31.5
MCHC, EXTERNAL: 31.5 NA
MCV, EXTERNAL: 94.1
MCV, EXTERNAL: 94.1 NA
MPV, EXTERNAL: 10.8
MPV, EXTERNAL: 10.8 NA
Platelets, EXTERNAL: 309
Platelets, External: 309 NA
RBC, EXTERNAL: 3.74
RBC, External: 3.74 NA
WBC, EXTERNAL: 8.6
WBC, EXTERNAL: 8.6 NA

## 2018-08-14 LAB — BMP, EXTERNAL
ANION GAP, EXTERNAL: 4
ANION GAP, EXTERNAL: 4 NA
BUN, EXTERNAL: 10
BUN, EXTERNAL: 10 NA
CO2, EXTERNAL: 33
CO2, External: 33 NA
CREATININE SER, EXTERNAL: 0.8
CREATININE SER, EXTERNAL: 0.8 NA
Chloride, EXTERNAL: 100
Chloride, EXTERNAL: 100 NA
Potassium, EXTERNAL: 4.1
Potassium, External: 4.1 NA
Sodium, EXTERNAL: 137
Sodium, External: 137 NA

## 2018-08-15 LAB — BMP, EXTERNAL
ANION GAP, EXTERNAL: 5
ANION GAP, EXTERNAL: 5 NA
BUN, EXTERNAL: 9
BUN, EXTERNAL: 9 NA
CO2, EXTERNAL: 30
CO2, External: 30 NA
CREATININE SER, EXTERNAL: 0.8
CREATININE SER, EXTERNAL: 0.8 NA
Chloride, EXTERNAL: 103
Chloride, EXTERNAL: 103 NA
Potassium, EXTERNAL: 4.2
Potassium, External: 4.2 NA
Sodium, EXTERNAL: 138
Sodium, External: 138 NA

## 2018-08-15 NOTE — Telephone Encounter (Signed)
Pt name & DOB verified.  When did you go to the ER? Monday   Emergency room visited: [facility]  What were you seen for? Pneumonia   How are you feeling today? Pt is doing okay pt has not established care with Dr. Dorene Grebe was a Dr. B pt, pt would like a call back to schedule      [phone]  Justina P Currier

## 2018-08-15 NOTE — Telephone Encounter (Signed)
Okay to do a telemedicine call with pt? Or would you like to have pt come in to be seen? You have not met pt yet, this was a Dr. Jacinto Reap pt

## 2018-08-15 NOTE — Telephone Encounter (Signed)
Does this have to be an in person visit?

## 2018-08-16 LAB — CULTURE, BLOOD 1

## 2018-08-16 LAB — CULTURE, BLOOD

## 2018-08-18 NOTE — Telephone Encounter (Signed)
ED notes and CT printed from [facility].   Left message for patient to call back. Ext [phone]

## 2018-08-18 NOTE — Telephone Encounter (Signed)
Please get me the ED note. Depends on the severity and O2 sats. Also depends on how the pt is doing. Please find out how the pt is and get me the ED note and I will decide. Thank you

## 2018-08-18 NOTE — Telephone Encounter (Signed)
Called pt and left vm to give [place] a call back to see how he was feeling. If pt calls back please transfer call. EXT [phone]

## 2018-08-19 ENCOUNTER — Inpatient Hospital Stay
Admit: 2018-08-19 | Discharge: 2018-08-19 | Disposition: A | Discharge status: Home / Self Care | Payer: MEDICAID | Attending: Family

## 2018-08-19 DIAGNOSIS — K0889 Other specified disorders of teeth and supporting structures: Secondary | ICD-10-CM

## 2018-08-19 MED ORDER — PENICILLIN V-K 500 MG TAB
500 mg | ORAL_TABLET | Freq: Three times a day (TID) | ORAL | 0 refills | Status: DC
Start: 2018-08-19 — End: 2018-08-25

## 2018-08-19 NOTE — ED Notes (Signed)
C/o upper dental pain x 2 days post removal 2 weeks ago.

## 2018-08-19 NOTE — ED Provider Notes (Signed)
43 y/o M presents to Urgent Care c/o dental pain.  He reports has intermittently persistent pain to his left upper gingival area surrounding his left upper canine which was removed last year however he states the piece was left in.  He states over the past few days pain has become more persistent and worsened in intensity.  Reports has noted some localized swelling.  Denies any drainage.  Denies any difficulty breathing or swallowing.    The history is provided by the patient.   Dental Pain             Past Medical History:   Diagnosis Date   ??? CAD (coronary artery disease)     MI x 3   ??? Cardiac revascularization with aortocoronary bypass anastomosis    ??? Chronic dental pain     - multiple teeth remove   ??? Chronic knee pain    ??? Hypertension    ??? Myocardial infarct [facility])    ??? S/P CABG x 1 2007    CABG x 1, 2007, due to congenital heart disease--patient's explanation is that right coronary artery was never in the proper location (perfused left side of heart only) and he had repeat cabage surgery in 2012 to correct this.        Past Surgical History:   Procedure Laterality Date   ??? HX CHOLECYSTECTOMY  08/2010        ??? HX CORONARY ARTERY BYPASS GRAFT  2007, 2012    2007: due to congenital heart disease - patients explanation is that right coronary artery was never in the proper location (perfused left side of heart only) and had repeat CABG surgery in 2012 to correct this   ??? HX OTHER SURGICAL  11/30/2013    TOOTH EXTRACTION         Family History   Problem Relation Age of Onset   ??? Heart Disease Mother    ??? Cancer Mother         BRAIN CANCER   ??? Diabetes Father    ??? Diabetes Brother    ??? Heart Disease Maternal Grandmother    ??? Dementia Maternal Grandmother    ??? Heart Disease Maternal Grandfather    ??? Heart Disease Paternal Grandmother    ??? Heart Disease Paternal Grandfather    ??? No Known Problems Other    ??? Heart Disease Brother         Social History     Socioeconomic History   ??? Marital status: SINGLE     Spouse  name: Not on file   ??? Number of children: 0   ??? Years of education: Not on file   ??? Highest education level: Not on file   Occupational History     Comment: DISABILITY   Social Needs   ??? Financial resource strain: Not on file   ??? Food insecurity     Worry: Not on file     Inability: Not on file   ??? Transportation needs     Medical: Not on file     Non-medical: Not on file   Tobacco Use   ??? Smoking status: Current Some Day Smoker     Packs/day: 0.25     Years: 32.00     Pack years: 8.00     Types: Cigarettes   ??? Smokeless tobacco: Former Neurosurgeon   ??? Tobacco comment: About 40-50 pack years, used to smoke 2 PPD but now down to 2 cigarettes a day  Substance and Sexual Activity   ??? Alcohol use: No   ??? Drug use: No   ??? Sexual activity: Not on file   Lifestyle   ??? Physical activity     Days per week: Not on file     Minutes per session: Not on file   ??? Stress: Not on file   Relationships   ??? Social Wellsite geologist on phone: Not on file     Gets together: Not on file     Attends religious service: Not on file     Active member of club or organization: Not on file     Attends meetings of clubs or organizations: Not on file     Relationship status: Not on file   ??? Intimate partner violence     Fear of current or ex partner: Not on file     Emotionally abused: Not on file     Physically abused: Not on file     Forced sexual activity: Not on file   Other Topics Concern   ??? Not on file   Social History Narrative    He is from [place] but moved to [place] at age 44.  Has been between [place] and [address]    Lives with wife, married since 2016     No children    Disability secondary to heart disease and syncopal episode    About 40-50 pack years, used to smoke 2 PPD but now down to 2 cigarettes a day    No EtOH    No drug use                ALLERGIES: Tramadol; Codeine; Morphine; Motrin [ibuprofen]; and Toradol [ketorolac]    Review of Systems   Constitutional: Negative.    HENT: Positive for dental problem.    Respiratory:  Negative.    Cardiovascular: Negative.    Skin: Negative.    Neurological: Negative.        Vitals:    08/19/18 1410   BP: (!) 156/94   Pulse: (!) 102   Resp: 16   Temp: 98.1 ??F (36.7 ??C)   SpO2: 98%       Physical Exam  Vitals signs and nursing note reviewed.       GENERAL: well-developed, well-nourished, in no distress, calm and cooperative  HEAD: atraumatic, normocephalic  NECK: supple, no adenopathy, full ROM, non-tender  OROPHARYNX:  Diffuse dental decay.  Tenderness with  Palpation to gingival area surrounding left upper canine which appears to have been instructed but small piece still remaining.  There is mild localized swelling without any fluctuance or drainage.  No trismus  LUNGS: clear to auscultation bilaterally, no wheezes, rales or rhonchi; respirations even and unlabored  HEART: regular rate and rhythm, immediate capillary refill, palpable strong bilateral radial pulses            MDM  Number of Diagnoses or Management Options  Pain, dental:   Diagnosis management comments:   Patient is afebrile, very well-appearing, and in no distress. No trismus, abscess, or suggestion of emergent or significant deep space or soft tissue infection at this time. Will treat for likely early localized infection at this time. The patient appears stable for discharge and verbalizes understanding of home care, followup with dental provider, and ED return precautions.                   Procedures

## 2018-08-19 NOTE — Other (Signed)
43 y/o M presents to Urgent Care c/o dental pain.  He reports has intermittently persistent pain to his left upper gingival area surrounding his left upper canine which was removed last year however he states the piece was left in.  He states over the past few days pain has become more persistent and worsened in intensity.  Reports has noted some localized swelling.  Denies any drainage.  Denies any difficulty breathing or swallowing.    The history is provided by the patient.   Dental Pain             Past Medical History:   Diagnosis Date   ??? CAD (coronary artery disease)     MI x 3   ??? Cardiac revascularization with aortocoronary bypass anastomosis    ??? Chronic dental pain     - multiple teeth remove   ??? Chronic knee pain    ??? Hypertension    ??? Myocardial infarct [facility])    ??? S/P CABG x 1 2007    CABG x 1, 2007, due to congenital heart disease--patient's explanation is that right coronary artery was never in the proper location (perfused left side of heart only) and he had repeat cabage surgery in 2012 to correct this.        Past Surgical History:   Procedure Laterality Date   ??? HX CHOLECYSTECTOMY  08/2010        ??? HX CORONARY ARTERY BYPASS GRAFT  2007, 2012    2007: due to congenital heart disease - patients explanation is that right coronary artery was never in the proper location (perfused left side of heart only) and had repeat CABG surgery in 2012 to correct this   ??? HX OTHER SURGICAL  11/30/2013    TOOTH EXTRACTION         Family History   Problem Relation Age of Onset   ??? Heart Disease Mother    ??? Cancer Mother         BRAIN CANCER   ??? Diabetes Father    ??? Diabetes Brother    ??? Heart Disease Maternal Grandmother    ??? Dementia Maternal Grandmother    ??? Heart Disease Maternal Grandfather    ??? Heart Disease Paternal Grandmother    ??? Heart Disease Paternal Grandfather    ??? No Known Problems Other    ??? Heart Disease Brother         Social History     Socioeconomic History   ??? Marital status: SINGLE      Spouse name: Not on file   ??? Number of children: 0   ??? Years of education: Not on file   ??? Highest education level: Not on file   Occupational History     Comment: DISABILITY   Social Needs   ??? Financial resource strain: Not on file   ??? Food insecurity     Worry: Not on file     Inability: Not on file   ??? Transportation needs     Medical: Not on file     Non-medical: Not on file   Tobacco Use   ??? Smoking status: Current Some Day Smoker     Packs/day: 0.25     Years: 32.00     Pack years: 8.00     Types: Cigarettes   ??? Smokeless tobacco: Former Neurosurgeon   ??? Tobacco comment: About 40-50 pack years, used to smoke 2 PPD but now down to 2 cigarettes a day  Substance and Sexual Activity   ??? Alcohol use: No   ??? Drug use: No   ??? Sexual activity: Not on file   Lifestyle   ??? Physical activity     Days per week: Not on file     Minutes per session: Not on file   ??? Stress: Not on file   Relationships   ??? Social Wellsite geologist on phone: Not on file     Gets together: Not on file     Attends religious service: Not on file     Active member of club or organization: Not on file     Attends meetings of clubs or organizations: Not on file     Relationship status: Not on file   ??? Intimate partner violence     Fear of current or ex partner: Not on file     Emotionally abused: Not on file     Physically abused: Not on file     Forced sexual activity: Not on file   Other Topics Concern   ??? Not on file   Social History Narrative    He is from [place] but moved to [place] at age 44.  Has been between [place] and [address]    Lives with wife, married since 2016     No children    Disability secondary to heart disease and syncopal episode    About 40-50 pack years, used to smoke 2 PPD but now down to 2 cigarettes a day    No EtOH    No drug use                ALLERGIES: Tramadol; Codeine; Morphine; Motrin [ibuprofen]; and Toradol [ketorolac]    Review of Systems   Constitutional: Negative.    HENT: Positive for dental problem.     Respiratory: Negative.    Cardiovascular: Negative.    Skin: Negative.    Neurological: Negative.        Vitals:    08/19/18 1410   BP: (!) 156/94   Pulse: (!) 102   Resp: 16   Temp: 98.1 ??F (36.7 ??C)   SpO2: 98%       Physical Exam  Vitals signs and nursing note reviewed.       GENERAL: well-developed, well-nourished, in no distress, calm and cooperative  HEAD: atraumatic, normocephalic  NECK: supple, no adenopathy, full ROM, non-tender  OROPHARYNX:  Diffuse dental decay.  Tenderness with  Palpation to gingival area surrounding left upper canine which appears to have been instructed but small piece still remaining.  There is mild localized swelling without any fluctuance or drainage.  No trismus  LUNGS: clear to auscultation bilaterally, no wheezes, rales or rhonchi; respirations even and unlabored  HEART: regular rate and rhythm, immediate capillary refill, palpable strong bilateral radial pulses            MDM  Number of Diagnoses or Management Options  Pain, dental:   Diagnosis management comments:   Patient is afebrile, very well-appearing, and in no distress. No trismus, abscess, or suggestion of emergent or significant deep space or soft tissue infection at this time. Will treat for likely early localized infection at this time. The patient appears stable for discharge and verbalizes understanding of home care, followup with dental provider, and ED return precautions.                   Procedures

## 2018-08-19 NOTE — Telephone Encounter (Signed)
Left message to call back. [phone]

## 2018-08-19 NOTE — Other (Signed)
C/o upper dental pain x 2 days post removal 2 weeks ago.

## 2018-08-20 NOTE — Telephone Encounter (Signed)
Dustin Carney had spoken with Pt today about scheduling a hospital follow up. She has requested records urgently from [facility] As soon as these records are received a telelhealth appointment can be scheduled. Pt is okay with waiting to address multiple issues at that time. Another encounter already open for this and other concerns.

## 2018-08-20 NOTE — Telephone Encounter (Signed)
Patient was hospitalized from 3/23-3/27. Reviewed discharge summary from [facility] but lacks a lot of information about hospital course. He was diagnosis with sepsis, pneumonia, influenza and respiratory failure. Also recently seen at [facility] yesterday for dental pain.     Spoke with [name], he states all "hospital complaints" have resolved. He denies fever and cough. Although upon further questioning does state that he still has some SOB with exertion and general malaise. He does not have pulse ox but states that this is not a concern for him. Confirmed that he is able to take a deep breath without coughing. His only current complaint is b/l ear pain with muffled hearing. He was informed he did have significant cerumen impaction. He states that he is doing ear drops (Debrox) and flushing with warm water as directed but still painful. Reviewed that he should continue with this. Unfortunately where he was recently treated for respiratory symptoms we would not be able to bring him into the office. We did review that they would like him to schedule appt with PCP. Reviewed that this could be done as a televisit. He does have phone that is capable of doing video. Informed him that Dr Dorene Grebe is not back into the office till Monday. Informed him that I would need to get more records from [facility] and review with a covering provider tomorrow. Informed him that we can schedule hospital discharge follow up based on Matt's review. He is agreeable to plan.     Spoke with Konrad Felix, she is going to request urgent hospital records from [facility]. Sending to [facility] as an FYI so this does not get overlooked tomorrow.     Hold for records.

## 2018-08-20 NOTE — Telephone Encounter (Signed)
Reviewed, COVID testing was negative,  Patient had influenza with secondary pneumonia.  If the patient is not symptomatic from a respiratory standpoint at this time I think it would make most sense to see him in the office to re-evaluate him if possible.  If not possible we could do tele health as noted.  Please complete typical hospital discharge protocol.

## 2018-08-20 NOTE — Telephone Encounter (Signed)
Pt name and DOB verified. Pt is calling states that a nurse called him back to check in. States that pt is still having the aches and pain. States that he feels like he has an ear infection. States that the nurse was going to have a Doctor call pt and states he has not heard back.   CB# [phone] Darcie Farrel Demark

## 2018-08-21 NOTE — Telephone Encounter (Signed)
Pt has been scheduled on Monday with Clent Ridges

## 2018-08-21 NOTE — Telephone Encounter (Signed)
TRANSITION OF CARE - NURSE PHONE ENCOUNTER     Type of Follow-up:  Hospital   Initial Contact Date:  08/21/2018  Admission Date:  08/11/18  Discharge Date: 08/15/18   Duration of this Admission:  4 days  Reason for this Admission:  Sepsis and Pneumonia  Are you receiving home health services?  no  Is anyone helping you at home?   yes- girlfriend    Was medication reconciliation performed?  yes  Review of discharge summary and patient's chart?  yes  Was this a readmission within 30 days for any diagnosis?  No  Was this a readmission within 30 days for same diagnosis?  No  How have you felt since leaving the hospital?:   improved  Just has aches and pains    Did you get all your prescriptions?  yes  Do you have a follow-up?  yes  Date of follow-up visit:  08/25/18  Follow-up provider:  Sula Rumple FNP    Did you get a copy of the discharge instructions?  yes  Do you have any questions on those instructions?  No  Follow-up on tests pending at discharge:  No  Follow-up on tests, labs, or procedures needed at D/C:  No    Patient contacted and I reviewed the above information with him:     [x]  Yes     []  No   If no, document why?      Does the patient or family have any concerns about their condition since discharge?  No    Advance Directives on File?  No

## 2018-08-25 ENCOUNTER — Telehealth: Admit: 2018-08-25 | Discharge: 2018-08-25 | Payer: MEDICAID | Attending: Family | Primary: Family Medicine

## 2018-08-25 ENCOUNTER — Telehealth: Attending: Family | Primary: Family Medicine

## 2018-08-25 DIAGNOSIS — J189 Pneumonia, unspecified organism: Secondary | ICD-10-CM
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
## 2018-08-25 NOTE — Telephone Encounter (Addendum)
Please contact pulmonary and have covering provider call me to review patient's case.
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
## 2018-08-26 ENCOUNTER — Telehealth

## 2018-08-26 NOTE — Assessment & Plan Note (Signed)
Noted pain of bilateral ears during hospitalization, treated with course of Debrox for cerumen impaction.  Ultimately returned to the ED on 08/25/18 in diagnosed with acute otitis media, given course of Augmentin 500 mg 3 times daily times 10 days.  Patient notes slight improvement in symptoms since being seen in the ED, he will continue with course of antibiotics as recommended and follow up as needed with any new or worsening symptoms with failure to resolve with above recommendations.

## 2018-08-26 NOTE — Assessment & Plan Note (Signed)
Please see corresponding assessment plan for bilateral pneumonia.

## 2018-08-26 NOTE — Assessment & Plan Note (Signed)
He is seen in hospital follow-up for presumed community acquired pneumonia versus viral pneumonia.  He was diagnosed with influenza prior to admission and presented with hypoxic respiratory failure.  Chest CT showed changing pattern of bilateral infiltrate with acute infectious versus noninfectious inflammatory process such as viral pneumonia, he had minimal leukocytosis but because of elevated prolactin bacterial pneumonia was favored.  Additional labs including blood culture, Legionella testing, coronavirus testing and sputum culture were all negative.  He was started on course of ceftriaxone and azithromycin with gradual improvement in his symptoms, he was able to be weaned from oxygen and discharged home on oral antibiotics including Augmentin and azithromycin.  Overall he notes resolution of his pulmonary symptoms but continues to complain of diffuse myalgia.  In reviewing chart history when he was previously admitted for multilobular pneumonia he had similar complaints, notes being allergic to multiple medications, was discharged from the hospital on hydrocodone.  Also reviewing the chart patient has history of presenting to the ED for chest pain receiving morphine and leaving against medical advice.  Pattern is suspicious for drug abuse, patient denied this when directly questioned.  He is counseled on his excessive Tylenol use, he should not use more than 4000 mg total per day over short duration, he is counseled on potential liver toxicity associated with his use.  Given he is 2 weeks out for treatment of his pneumonia his diffuse myalgia would not be consistent with his presentation.  I have suggested he may require further evaluation to determine if there is underlying cause.  Because his diffuse mediastinal adenopathy which is persistent there is also concern for sarcoidosis versus malignant process which will require further evaluation, I have recommended pulmonary consultation which I will do by  phone and determine if patient needs to see pulmonology verses referral to oncology.  Please see corresponding assessment plan for mediastinal adenopathy.  Patient continues to smoke 1/4 pack per day, he has 30 year history of smoking of 2 packs per day prior.  He is counseled on long-term consequences of his smoking and is advised to quit.  I have recommended follow-up in the office but will plan to schedule once I have reviewed case with pulmonology to determine more definitive plan.  Hospital documents including consult notes, discharge summary, medication reconciliation, labs and imaging were reviewed, please see HPI for details.  Patient will continue to monitor symptoms at home and follow up with any new or worsening symptoms or questions or concerns, I will otherwise follow up with the patient as noted above.

## 2018-08-26 NOTE — Assessment & Plan Note (Signed)
Hospital CT noted moderate to severe mediastinal adenopathy similar to previous with bilateral infiltrates concerning for acute infectious versus noninfectious inflammatory process such as viral pneumonia.  Because of patient's recent influenza and elevated prolactin bacterial pneumonia was favored although no immediate leukocytosis on admission CBC.  He was treated with course of ceftriaxone and azithromycin with gradual improvement in symptoms.  Of note he was previously admitted for multilobular pneumonia August 2018 at which time he had numerous bilateral infiltrates with previously noted mediastinal adenopathy, there is no adenopathy in abdomen or pelvis.  There is also notation of borderline splenomegaly GERD.  Will contact pulmonology to discuss patient's case and review imaging, adenopathy possibly in the setting of sarcoidosis versus neoplastic process?  If after review with pulmonology they do not feel this is a pulmonary concern will refer him to hematology for further evaluation.  I will plan to call with recommendations as clinically indicated.

## 2018-08-26 NOTE — Assessment & Plan Note (Signed)
Current smoker of 1/4 pack per day and declines to quit.  We have reviewed the long-term consequences of his smoking advised him to quit.  We have reviewed smoking cessation options which he declines at this time.  He will follow up at any point should he change his mind with regards to smoking cessation, otherwise continue to address at follow-up.

## 2018-08-26 NOTE — Telephone Encounter (Signed)
Reviewed with pulmonology, please see corresponding phone note.

## 2018-08-26 NOTE — Telephone Encounter (Signed)
Spoke with Dustin Carney, he is aware of Dr McArdle's recommendations. He is agreeable to repeat CT in 3 months. Informed him to contact the office if there are any concerns or questions prior to July. He is agreeable to plan.

## 2018-08-26 NOTE — Assessment & Plan Note (Signed)
Hospital CT noted moderate to severe mediastinal adenopathy similar to previous with bilateral infiltrates concerning for acute infectious versus noninfectious inflammatory process such as viral pneumonia.  Because of patient's recent influenza and elevated prolactin bacterial pneumonia was favored although no immediate leukocytosis on admission CBC.  He was treated with course of ceftriaxone and azithromycin with gradual improvement in symptoms.  Of note he was previously admitted for multilobular pneumonia August 2018 at which time he had numerous bilateral infiltrates with previously noted mediastinal adenopathy, there is no adenopathy in abdomen or pelvis.  There is also notation of borderline splenomegaly GERD.  Will contact pulmonology to discuss patient's case and review imaging, adenopathy possibly in the setting of sarcoidosis versus neoplastic process?  If after review with pulmonology they do not feel this is a pulmonary concern will refer him to hematology for further evaluation.  I will plan to call with recommendations as clinically indicated.

## 2018-08-26 NOTE — Assessment & Plan Note (Signed)
Noted pain of bilateral ears during hospitalization, treated with course of Debrox for cerumen impaction.  Ultimately returned to the ED on 08/25/18 in diagnosed with acute otitis media, given course of Augmentin 500 mg 3 times daily times 10 days.  Patient notes slight improvement in symptoms since being seen in the ED, he will continue with course of antibiotics as recommended and follow up as needed with any new or worsening symptoms with failure to resolve with above recommendations.

## 2018-08-26 NOTE — Telephone Encounter (Signed)
Dustin Carney I reviewed the only films at her available to me in [facility] are all essentially normal chest x-rays except for the stigmata of his CABG.    Not sure where the CT scan reports are coming from.  If you could ask for the images to be loaded up into impacts I would be happy to review them.  It is difficult to determine reading through whether this is going a continuous radiographic abnormality or 2 distinct episodes.  I tried calling you at extension [phone] but there was no answer hope this helps.  If he needed to talk with may just call the office for 320 and ask them to interrupt me (which is not as easy of amount telephone encounter and is on in office encounter).

## 2018-08-26 NOTE — Assessment & Plan Note (Signed)
He is seen in hospital follow-up for presumed community acquired pneumonia versus viral pneumonia.  He was diagnosed with influenza prior to admission and presented with hypoxic respiratory failure.  Chest CT showed changing pattern of bilateral infiltrate with acute infectious versus noninfectious inflammatory process such as viral pneumonia, he had minimal leukocytosis but because of elevated prolactin bacterial pneumonia was favored.  Additional labs including blood culture, Legionella testing, coronavirus testing and sputum culture were all negative.  He was started on course of ceftriaxone and azithromycin with gradual improvement in his symptoms, he was able to be weaned from oxygen and discharged home on oral antibiotics including Augmentin and azithromycin.  Overall he notes resolution of his pulmonary symptoms but continues to complain of diffuse myalgia.  In reviewing chart history when he was previously admitted for multilobular pneumonia he had similar complaints, notes being allergic to multiple medications, was discharged from the hospital on hydrocodone.  Also reviewing the chart patient has history of presenting to the ED for chest pain receiving morphine and leaving against medical advice.  Pattern is suspicious for drug abuse, patient denied this when directly questioned.  He is counseled on his excessive Tylenol use, he should not use more than 4000 mg total per day over short duration, he is counseled on potential liver toxicity associated with his use.  Given he is 2 weeks out for treatment of his pneumonia his diffuse myalgia would not be consistent with his presentation.  I have suggested he may require further evaluation to determine if there is underlying cause.  Because his diffuse mediastinal adenopathy which is persistent there is also concern for sarcoidosis versus malignant process which will require further evaluation, I have recommended pulmonary consultation which I will do by  phone and determine if patient needs to see pulmonology verses referral to oncology.  Please see corresponding assessment plan for mediastinal adenopathy.  Patient continues to smoke 1/4 pack per day, he has 30 year history of smoking of 2 packs per day prior.  He is counseled on long-term consequences of his smoking and is advised to quit.  I have recommended follow-up in the office but will plan to schedule once I have reviewed case with pulmonology to determine more definitive plan.  Hospital documents including consult notes, discharge summary, medication reconciliation, labs and imaging were reviewed, please see HPI for details.  Patient will continue to monitor symptoms at home and follow up with any new or worsening symptoms or questions or concerns, I will otherwise follow up with the patient as noted above.

## 2018-08-26 NOTE — Telephone Encounter (Signed)
French Ana from Dr Hardens office called Dr Anthonette Legato would like to speak to Dr Llana Aliment regarding above pt Dr Dorethea Clan extension [phone]

## 2018-08-26 NOTE — Assessment & Plan Note (Signed)
Please see corresponding assessment plan for bilateral pneumonia.

## 2018-08-26 NOTE — Assessment & Plan Note (Signed)
Current smoker of 1/4 pack per day and declines to quit.  We have reviewed the long-term consequences of his smoking advised him to quit.  We have reviewed smoking cessation options which he declines at this time.  He will follow up at any point should he change his mind with regards to smoking cessation, otherwise continue to address at follow-up.

## 2018-08-26 NOTE — Telephone Encounter (Signed)
Spoke to Pulmonary, they will have Dr. Llana Aliment call back.

## 2018-08-26 NOTE — Telephone Encounter (Signed)
Please make patient aware that I have reviewed case with pulmonology.  Interval chest x-rays between to prior chest CT scans were reviewed and did not show any significant prominent adenopathy therefore favoring reactive adenopathy secondary to infection.  Would recommend 3 month follow-up chest CT to verify improvement in lymph nodes and no further evaluation required unless he develops new or worsening pulmonary symptoms in the interim.

## 2018-09-03 ENCOUNTER — Inpatient Hospital Stay
Admit: 2018-09-03 | Discharge: 2018-09-03 | Disposition: A | Discharge status: Home / Self Care | Payer: MEDICAID | Attending: Emergency Medicine

## 2018-09-03 ENCOUNTER — Emergency Department: Admit: 2018-09-03 | Payer: PRIVATE HEALTH INSURANCE | Primary: Family Medicine

## 2018-09-03 DIAGNOSIS — R079 Chest pain, unspecified: Secondary | ICD-10-CM

## 2018-09-03 LAB — PROTHROMBIN TIME CARE EVERYWHERE
INR CARE EVERYWHERE: 1 (ref 0.9–1.1)
PROTHROMBIN TIME CARE EVERYWHERE: 13 s (ref 12.1–13.9)

## 2018-09-03 LAB — APTT CARE EVERYWHERE: APTT CARE EVERYWHERE: 27.5 s (ref 24.1–35.1)

## 2018-09-03 LAB — COMPREHENSIVE METABOLIC PANEL
ALT: 81 U/L — ABNORMAL HIGH (ref 12–78)
AST: 48 U/L — ABNORMAL HIGH (ref 10–37)
Albumin: 3 g/dL — ABNORMAL LOW (ref 3.4–5.0)
Alkaline Phosphatase: 69 U/L (ref 43–117)
BUN: 13 MG/DL (ref 7–22)
CO2: 25 mmol/L (ref 21–32)
Calcium: 9 MG/DL (ref 8.5–10.1)
Chloride: 109 mmol/L — ABNORMAL HIGH (ref 98–108)
Creatinine: 0.98 MG/DL (ref 0.70–1.30)
EGFR IF NonAfrican American: >60 mL/min/{1.73_m2} (ref >60)
GFR African American: >60 mL/min/{1.73_m2} (ref >60)
Glucose: 149 mg/dL — ABNORMAL HIGH (ref 74–106)
Potassium: 3.7 mmol/L (ref 3.4–5.1)
Sodium: 141 mmol/L (ref 136–145)
Total Bilirubin: 0.3 mg/dL (ref 0.00–1.00)
Total Protein: 8.2 g/dL (ref 6.4–8.2)

## 2018-09-03 LAB — CBC WITH AUTO DIFFERENTIAL
Basophils %: 0 % (ref 0–2)
Basophils Absolute: 0 10*3/uL (ref 0.00–0.08)
Eosinophils %: 3 % (ref 0–5)
Eosinophils Absolute: 0.2 10*3/uL (ref 0.0–0.5)
Hematocrit: 39.5 % — ABNORMAL LOW (ref 42.0–52.0)
Hemoglobin: 12.8 g/dL — ABNORMAL LOW (ref 14.0–18.0)
Lymphocytes %: 34 % (ref 14–46)
Lymphocytes Absolute: 2.2 10*3/uL (ref 1.3–3.6)
MCH: 30.5 PG (ref 27.0–31.0)
MCHC: 32.4 g/dL — ABNORMAL LOW (ref 33.0–37.0)
MCV: 94 FL (ref 80.0–94.0)
MPV: 9.7 FL (ref 7.4–10.4)
Monocytes %: 7 % (ref 5–12)
Monocytes Absolute: 0.5 10*3/uL (ref 0.3–0.8)
Neutrophils %: 56 % (ref 47–80)
Neutrophils Absolute: 3.6 10*3/uL (ref 1.8–5.4)
Platelets: 288 10*3/uL (ref 130–400)
RBC: 4.2 M/uL — ABNORMAL LOW (ref 4.70–6.10)
RDW: 14.7 % — ABNORMAL HIGH (ref 11.5–14.5)
WBC: 6.4 10*3/uL (ref 4.5–10.9)

## 2018-09-03 LAB — PROTIME-INR
INR: 1 NA (ref 0.9–1.1)
Protime: 13 s (ref 12.1–13.9)

## 2018-09-03 LAB — TROPONIN: Troponin I: <0.015 ng/mL (ref 0.000–0.045)

## 2018-09-03 LAB — APTT: aPTT: 27.5 s (ref 24.1–35.1)

## 2018-09-03 LAB — LIPASE
Lipase: 121 U/L (ref 73–393)
Lipase: 121 U/L (ref 73–393)

## 2018-09-03 LAB — METABOLIC PANEL, COMPREHENSIVE
ALT (SGPT): 81 U/L — ABNORMAL HIGH (ref 12–78)
AST (SGOT): 48 U/L — ABNORMAL HIGH (ref 10–37)
Albumin: 3 g/dL — ABNORMAL LOW (ref 3.4–5.0)
Alk. phosphatase: 69 U/L (ref 43–117)
BUN: 13 MG/DL (ref 7–22)
Bilirubin, total: 0.3 mg/dL (ref 0.00–1.00)
CO2: 25 mmol/L (ref 21–32)
Calcium: 9 MG/DL (ref 8.5–10.1)
Chloride: 109 mmol/L — ABNORMAL HIGH (ref 98–108)
Creatinine: 0.98 MG/DL (ref 0.70–1.30)
GFR est AA: >60 mL/min/{1.73_m2} (ref >60)
GFR est non-AA: >60 mL/min/{1.73_m2} (ref >60)
Glucose: 149 mg/dL — ABNORMAL HIGH (ref 74–106)
Potassium: 3.7 mmol/L (ref 3.4–5.1)
Protein, total: 8.2 g/dL (ref 6.4–8.2)
Sodium: 141 mmol/L (ref 136–145)

## 2018-09-03 LAB — CBC WITH AUTOMATED DIFF
ABS. BASOPHILS: 0 10*3/uL (ref 0.00–0.08)
ABS. EOSINOPHILS: 0.2 10*3/uL (ref 0.0–0.5)
ABS. LYMPHOCYTES: 2.2 10*3/uL (ref 1.3–3.6)
ABS. MONOCYTES: 0.5 10*3/uL (ref 0.3–0.8)
ABS. NEUTROPHILS: 3.6 10*3/uL (ref 1.8–5.4)
BASOPHILS: 0 % (ref 0–2)
EOSINOPHILS: 3 % (ref 0–5)
HCT: 39.5 % — ABNORMAL LOW (ref 42.0–52.0)
HGB: 12.8 g/dL — ABNORMAL LOW (ref 14.0–18.0)
LYMPHOCYTES: 34 % (ref 14–46)
MCH: 30.5 PG (ref 27.0–31.0)
MCHC: 32.4 g/dL — ABNORMAL LOW (ref 33.0–37.0)
MCV: 94 FL (ref 80.0–94.0)
MONOCYTES: 7 % (ref 5–12)
MPV: 9.7 FL (ref 7.4–10.4)
NEUTROPHILS: 56 % (ref 47–80)
PLATELET: 288 10*3/uL (ref 130–400)
RBC: 4.2 M/uL — ABNORMAL LOW (ref 4.70–6.10)
RDW: 14.7 % — ABNORMAL HIGH (ref 11.5–14.5)
WBC: 6.4 10*3/uL (ref 4.5–10.9)

## 2018-09-03 LAB — PTT: aPTT: 27.5 s (ref 24.1–35.1)

## 2018-09-03 LAB — PROTHROMBIN TIME + INR
INR: 1 (ref 0.9–1.1)
Prothrombin time: 13 s (ref 12.1–13.9)

## 2018-09-03 LAB — TROPONIN I: Troponin-I, Qt.: <0.015 ng/mL (ref 0.000–0.045)

## 2018-09-03 MED ORDER — ASPIRIN 81 MG CHEWABLE TAB
81 mg | ORAL | Status: AC
Start: 2018-09-03 — End: 2018-09-03
  Administered 2018-09-03: 05:00:00 via ORAL

## 2018-09-03 MED ORDER — SODIUM CHLORIDE 0.9% BOLUS IV
0.9 % | Freq: Once | INTRAVENOUS | Status: AC
Start: 2018-09-03 — End: 2018-09-03
  Administered 2018-09-03: 05:00:00 via INTRAVENOUS

## 2018-09-03 MED FILL — ASPIRIN 81 MG CHEWABLE TAB: 81 mg | ORAL | Qty: 4

## 2018-09-03 MED FILL — SODIUM CHLORIDE 0.9 % IV: INTRAVENOUS | Qty: 1000

## 2018-09-03 NOTE — ED Notes (Signed)
Pt arrives with chest pain he reports woke him up PTA.  Pt has a history of MI, states he feels pain and pressure, worse than his previous MI's.  States he has syncope episodes and felt like he was going to pass out earlier.  States he is taking his medications as prescribed.  Denies ETOH/drug use.  States no SOB, diaphoresis, N/V/D at this time. No recent exposure to sick people, no known fevers at home. No cough, running nose or other symptoms noted.

## 2018-09-03 NOTE — ED Notes (Signed)
Pt to rad dept for cxr.

## 2018-09-03 NOTE — ED Notes (Signed)
Pt sitting on the end of the bed, states he ride is outside, and he wants to leave. Pt unwilling to wait for more blood test results. Er md aware, hw dcd pt signed out AMA, encouraged pt to f/u with cardiologist asap. Pt walked out in nad

## 2018-09-03 NOTE — ED Notes (Signed)
Pt here for eval of c/p that awoke pt from a sleep. Hx cardiac by pass

## 2018-09-03 NOTE — ED Provider Notes (Signed)
Patient is 43 year old male past medical history of a MI status post 1 vessel CABG in 2007, 3 previous MIs most recently 2013, hypertension, and tobacco use who presents with chest pain.  Pain will come up from a sleep about 30 minutes prior to arrival.  It is left-sided, squeezing pain that radiates into his left jaw and down his left arm.  It feels like when he had his previous heart attack but worse.  Patient says he gets chest pain a lot but has not had a stress test since 2015. He is not getting chest pain with ambulation.  This occurred at rest and will come up from sleep.  He does not do drugs or drink alcohol.  He takes his metoprolol, full aspirin daily, and his cholesterol medication.  He is compliant with all of his meds.  He does smoke tobacco but is trying to quit.  The pain does not radiate into his back.  He does not feel short of breath.  When he 1st woke up they really stressed about that he was having chest pain.  He felt like he was going to pass out but instead he just said on the bed and that presyncopal feeling resolved.  He currently rates his pain as 8/10.  When I enter the room the patient is joking with the nurse and texting on his phone.             Past Medical History:   Diagnosis Date   ??? CAD (coronary artery disease)     MI x 3   ??? Cardiac revascularization with aortocoronary bypass anastomosis    ??? Chronic dental pain     - multiple teeth remove   ??? Chronic knee pain    ??? Hypertension    ??? Myocardial infarct [facility])    ??? S/P CABG x 1 2007    CABG x 1, 2007, due to congenital heart disease--patient's explanation is that right coronary artery was never in the proper location (perfused left side of heart only) and he had repeat cabage surgery in 2012 to correct this.       Past Surgical History:   Procedure Laterality Date   ??? HX CHOLECYSTECTOMY  08/2010        ??? HX CORONARY ARTERY BYPASS GRAFT  2007, 2012    2007: due to congenital heart disease - patients explanation is that right  coronary artery was never in the proper location (perfused left side of heart only) and had repeat CABG surgery in 2012 to correct this   ??? HX OTHER SURGICAL  11/30/2013    TOOTH EXTRACTION         Family History:   Problem Relation Age of Onset   ??? Heart Disease Mother    ??? Cancer Mother         BRAIN CANCER   ??? Diabetes Father    ??? Diabetes Brother    ??? Heart Disease Maternal Grandmother    ??? Dementia Maternal Grandmother    ??? Heart Disease Maternal Grandfather    ??? Heart Disease Paternal Grandmother    ??? Heart Disease Paternal Grandfather    ??? No Known Problems Other    ??? Heart Disease Brother        Social History     Socioeconomic History   ??? Marital status: SINGLE     Spouse name: Not on file   ??? Number of children: 0   ??? Years of education: Not on file   ???  Highest education level: Not on file   Occupational History     Comment: DISABILITY   Social Needs   ??? Financial resource strain: Not on file   ??? Food insecurity     Worry: Not on file     Inability: Not on file   ??? Transportation needs     Medical: Not on file     Non-medical: Not on file   Tobacco Use   ??? Smoking status: Current Some Day Smoker     Packs/day: 0.25     Years: 32.00     Pack years: 8.00     Types: Cigarettes   ??? Smokeless tobacco: Former Neurosurgeon   ??? Tobacco comment: About 40-50 pack years, used to smoke 2 PPD but now down to 2 cigarettes a day   Substance and Sexual Activity   ??? Alcohol use: No   ??? Drug use: No   ??? Sexual activity: Not on file   Lifestyle   ??? Physical activity     Days per week: Not on file     Minutes per session: Not on file   ??? Stress: Not on file   Relationships   ??? Social Wellsite geologist on phone: Not on file     Gets together: Not on file     Attends religious service: Not on file     Active member of club or organization: Not on file     Attends meetings of clubs or organizations: Not on file     Relationship status: Not on file   ??? Intimate partner violence     Fear of current or ex partner: Not on file      Emotionally abused: Not on file     Physically abused: Not on file     Forced sexual activity: Not on file   Other Topics Concern   ??? Not on file   Social History Narrative    He is from [place] but moved to [place] at age 52.  Has been between [place] and [address]    Lives with wife, married since 2016     No children    Disability secondary to heart disease and syncopal episode    About 40-50 pack years, used to smoke 2 PPD but now down to 2 cigarettes a day    No EtOH    No drug use         ALLERGIES: Codeine; Morphine; Motrin [ibuprofen]; Toradol [ketorolac]; and Tramadol    Review of Systems   Constitutional: Negative for chills and fever.   HENT: Negative for rhinorrhea.    Respiratory: Negative for cough and shortness of breath.    Cardiovascular: Positive for chest pain.   Gastrointestinal: Negative for abdominal pain, nausea and vomiting.   Musculoskeletal: Negative for back pain.   Neurological: Negative for syncope and headaches.       Vitals:    09/03/18 0042 09/03/18 0046   BP: 152/84    Pulse: 91    Resp: 16    Temp: 98.8 ??F (37.1 ??C)    SpO2: 98%    Weight:  127 kg (280 lb)   Height:  6\' 4"  (1.93 m)            Physical Exam     General: Awake alert oriented, no acute distress, nontoxic  Head: Normocephalic atraumatic  Eyes:, pupils are equal respond to light appropriately   Oropharynx: Mucous membranes moist, tongue midline, uvula  midline, dentition intact  Neck: Supple, trachea midline   Nose: Unremarkable  Cardiovascular: Regular rate and rhythm S1-S2 noted, no murmur, rub or gallop appreciated.  No reproducible tenderness on palpation of the anterior left chest wall  Pulmonary: Clear to auscultation bilaterally no wheezes, rales or rhonchi appreciated  Abdomen: Soft nontender nondistended   Extremities: Full range of motion in all extremities, all 4 extremities are warm and well-perfused  Skin: Intact grossly, no rashes appreciated  Neurologic: No focal neurological deficits  appreciated  Psychiatric: Mood appropriate, affect normal, no suicidal homicidal ideation        MDM  Number of Diagnoses or Management Options  Chest pain, unspecified type:   Left against medical advice:   Tobacco use:      Amount and/or Complexity of Data Reviewed  Decide to obtain previous medical records or to obtain history from someone other than the patient: yes      ED Course as of Sep 03 251   Wed Sep 03, 2018   0113 Initial EKG is unremarkable.  Hemoglobin is 12.8 with hematocrit of 39.5.  This is stable when compared to previous.    [EM]   0218 Patient's workup is unremarkable.  CBC, CMP, lipase, coagulation studies are all unremarkable.  First troponin is negligible at less than 0.015.  Chest x-ray is unremarkable.  Patient has been to multiple ERs around [place] over the past few years.  He tells me he has not had a stress test since 2015. His pattern is typically leaving against medical advice.  I am recommending he stay for stress test in the morning especially given his history.    [EM]   [id number] Patient does not want to stay.  He will not stay for a 2nd troponin.  He wants leave against medical advice.  I advised the patient to stay.  He is high risk.  He should have a stress test.  The patient has left against medical advice the last 3 times he has visit various emergency rooms for chest pain.  He is sober, has capacity.  He is able to leave AMA.       DISPO - AMA - The patient declines further evaluation and/or treatment. Patient is alert, oriented to (+)person (+)place (+)time, articulating clearly and ambulatory with a steady gait. Patient is clinically sober and has decision making capacity and is able to understand the risks as discussed with them. This action is against medical advice provided to the patient by Dr. Bonne Dolores. and the decision was made with informed refusal. The patient was told that further evaluation and/or treatment is necessary and a full explanation of the rationale was  given. The risks of action were explained to the patient and include, but are not limited to, worsening of known or currently unknown conditions, permanent disability and death from undiagnosed or untreated conditions.  The patient indicated understanding the clinical situation and the explanation of the risks of leaving. The patient voluntarily accepts these risks and a signed AMA form documenting the conversation with Dr. Bonne Dolores was obtained. The patient was given the opportunity to ask questions and reconsider. The patient was encouraged to return to the Emergency Department at any time for further care             [EM]      ED Course User Index  [EM] Aldean Ast, DO       EKG  Date/Time: 09/03/2018 12:54 AM  Performed by: Aldean Ast,  DO  Authorized by: Aldean AstMadore, Kevontay Burks J, DO     ECG reviewed by ED Physician in the absence of a cardiologist: yes    Comments:      EKG shows sinus rhythm with a rate of 84 beats per minute.  There is no concerning ST elevation.  When reviewing the previous EKG, no changes present.          NIH Stroke Scale

## 2018-09-03 NOTE — ED Notes (Signed)
Pt sitting on the end of the bed, states he ride is outside, and he wants to leave. Pt unwilling to wait for more blood test results. Er md aware, hw dcd pt signed out AMA, encouraged pt to f/u with cardiologist asap. Pt walked out in nad

## 2018-09-03 NOTE — ED Triage Notes (Signed)
Pt arrives with chest pain he reports woke him up 30minutes PTA.  Pt has a history of MI, states he feels pain and pressure, worse than his previous MI's.  States he has syncope episodes and felt like he was going to pass out earlier.  States he is taking his medications as prescribed.  Denies ETOH/drug use.  States no SOB, diaphoresis, N/V/D at this time. No recent exposure to sick people, no known fevers at home. No cough, running nose or other symptoms noted.

## 2018-09-03 NOTE — ED Notes (Signed)
Pt here for eval of c/p that awoke pt from a sleep. Hx cardiac by pass

## 2018-09-03 NOTE — ED Notes (Signed)
Pt to rad dept for cxr.

## 2018-09-03 NOTE — ED Provider Notes (Signed)
Patient is 43 year old male past medical history of a MI status post 1 vessel CABG in 2007, 3 previous MIs most recently 2013, hypertension, and tobacco use who presents with chest pain.  Pain will come up from a sleep about 30 minutes prior to arrival.  It is left-sided, squeezing pain that radiates into his left jaw and down his left arm.  It feels like when he had his previous heart attack but worse.  Patient says he gets chest pain a lot but has not had a stress test since 2015. He is not getting chest pain with ambulation.  This occurred at rest and will come up from sleep.  He does not do drugs or drink alcohol.  He takes his metoprolol, full aspirin daily, and his cholesterol medication.  He is compliant with all of his meds.  He does smoke tobacco but is trying to quit.  The pain does not radiate into his back.  He does not feel short of breath.  When he 1st woke up they really stressed about that he was having chest pain.  He felt like he was going to pass out but instead he just said on the bed and that presyncopal feeling resolved.  He currently rates his pain as 8/10.  When I enter the room the patient is joking with the nurse and texting on his phone.             Past Medical History:   Diagnosis Date   ??? CAD (coronary artery disease)     MI x 3   ??? Cardiac revascularization with aortocoronary bypass anastomosis    ??? Chronic dental pain     - multiple teeth remove   ??? Chronic knee pain    ??? Hypertension    ??? Myocardial infarct [facility](HCC)    ??? S/P CABG x 1 2007    CABG x 1, 2007, due to congenital heart disease--patient's explanation is that right coronary artery was never in the proper location (perfused left side of heart only) and he had repeat cabage surgery in 2012 to correct this.       Past Surgical History:   Procedure Laterality Date   ??? HX CHOLECYSTECTOMY  08/2010        ??? HX CORONARY ARTERY BYPASS GRAFT  2007, 2012    2007: due to congenital heart disease - patients explanation is that  right coronary artery was never in the proper location (perfused left side of heart only) and had repeat CABG surgery in 2012 to correct this   ??? HX OTHER SURGICAL  11/30/2013    TOOTH EXTRACTION         Family History:   Problem Relation Age of Onset   ??? Heart Disease Mother    ??? Cancer Mother         BRAIN CANCER   ??? Diabetes Father    ??? Diabetes Brother    ??? Heart Disease Maternal Grandmother    ??? Dementia Maternal Grandmother    ??? Heart Disease Maternal Grandfather    ??? Heart Disease Paternal Grandmother    ??? Heart Disease Paternal Grandfather    ??? No Known Problems Other    ??? Heart Disease Brother        Social History     Socioeconomic History   ??? Marital status: SINGLE     Spouse name: Not on file   ??? Number of children: 0   ??? Years of education: Not on file   ???  Highest education level: Not on file   Occupational History     Comment: DISABILITY   Social Needs   ??? Financial resource strain: Not on file   ??? Food insecurity     Worry: Not on file     Inability: Not on file   ??? Transportation needs     Medical: Not on file     Non-medical: Not on file   Tobacco Use   ??? Smoking status: Current Some Day Smoker     Packs/day: 0.25     Years: 32.00     Pack years: 8.00     Types: Cigarettes   ??? Smokeless tobacco: Former Neurosurgeon   ??? Tobacco comment: About 40-50 pack years, used to smoke 2 PPD but now down to 2 cigarettes a day   Substance and Sexual Activity   ??? Alcohol use: No   ??? Drug use: No   ??? Sexual activity: Not on file   Lifestyle   ??? Physical activity     Days per week: Not on file     Minutes per session: Not on file   ??? Stress: Not on file   Relationships   ??? Social Wellsite geologist on phone: Not on file     Gets together: Not on file     Attends religious service: Not on file     Active member of club or organization: Not on file     Attends meetings of clubs or organizations: Not on file     Relationship status: Not on file   ??? Intimate partner violence     Fear of current or ex partner: Not on file      Emotionally abused: Not on file     Physically abused: Not on file     Forced sexual activity: Not on file   Other Topics Concern   ??? Not on file   Social History Narrative    He is from [place] but moved to [place] at age 52.  Has been between [place] and [address]    Lives with wife, married since 2016     No children    Disability secondary to heart disease and syncopal episode    About 40-50 pack years, used to smoke 2 PPD but now down to 2 cigarettes a day    No EtOH    No drug use         ALLERGIES: Codeine; Morphine; Motrin [ibuprofen]; Toradol [ketorolac]; and Tramadol    Review of Systems   Constitutional: Negative for chills and fever.   HENT: Negative for rhinorrhea.    Respiratory: Negative for cough and shortness of breath.    Cardiovascular: Positive for chest pain.   Gastrointestinal: Negative for abdominal pain, nausea and vomiting.   Musculoskeletal: Negative for back pain.   Neurological: Negative for syncope and headaches.       Vitals:    09/03/18 0042 09/03/18 0046   BP: 152/84    Pulse: 91    Resp: 16    Temp: 98.8 ??F (37.1 ??C)    SpO2: 98%    Weight:  127 kg (280 lb)   Height:  6\' 4"  (1.93 m)            Physical Exam     General: Awake alert oriented, no acute distress, nontoxic  Head: Normocephalic atraumatic  Eyes:, pupils are equal respond to light appropriately   Oropharynx: Mucous membranes moist, tongue midline, uvula  midline, dentition intact  Neck: Supple, trachea midline   Nose: Unremarkable  Cardiovascular: Regular rate and rhythm S1-S2 noted, no murmur, rub or gallop appreciated.  No reproducible tenderness on palpation of the anterior left chest wall  Pulmonary: Clear to auscultation bilaterally no wheezes, rales or rhonchi appreciated  Abdomen: Soft nontender nondistended   Extremities: Full range of motion in all extremities, all 4 extremities are warm and well-perfused  Skin: Intact grossly, no rashes appreciated  Neurologic: No focal neurological deficits appreciated   Psychiatric: Mood appropriate, affect normal, no suicidal homicidal ideation        MDM  Number of Diagnoses or Management Options  Chest pain, unspecified type:   Left against medical advice:   Tobacco use:      Amount and/or Complexity of Data Reviewed  Decide to obtain previous medical records or to obtain history from someone other than the patient: yes      ED Course as of Sep 03 251   Wed Sep 03, 2018   0113 Initial EKG is unremarkable.  Hemoglobin is 12.8 with hematocrit of 39.5.  This is stable when compared to previous.    [EM]   0218 Patient's workup is unremarkable.  CBC, CMP, lipase, coagulation studies are all unremarkable.  First troponin is negligible at less than 0.015.  Chest x-ray is unremarkable.  Patient has been to multiple ERs around [place] over the past few years.  He tells me he has not had a stress test since 2015. His pattern is typically leaving against medical advice.  I am recommending he stay for stress test in the morning especially given his history.    [EM]   [id number] Patient does not want to stay.  He will not stay for a 2nd troponin.  He wants leave against medical advice.  I advised the patient to stay.  He is high risk.  He should have a stress test.  The patient has left against medical advice the last 3 times he has visit various emergency rooms for chest pain.  He is sober, has capacity.  He is able to leave AMA.       DISPO - AMA - The patient declines further evaluation and/or treatment. Patient is alert, oriented to (+)person (+)place (+)time, articulating clearly and ambulatory with a steady gait. Patient is clinically sober and has decision making capacity and is able to understand the risks as discussed with them. This action is against medical advice provided to the patient by Dr. Bonne Dolores. and the decision was made with informed refusal. The patient was told that further evaluation and/or treatment is necessary  and a full explanation of the rationale was given. The risks of action were explained to the patient and include, but are not limited to, worsening of known or currently unknown conditions, permanent disability and death from undiagnosed or untreated conditions.  The patient indicated understanding the clinical situation and the explanation of the risks of leaving. The patient voluntarily accepts these risks and a signed AMA form documenting the conversation with Dr. Bonne Dolores was obtained. The patient was given the opportunity to ask questions and reconsider. The patient was encouraged to return to the Emergency Department at any time for further care             [EM]      ED Course User Index  [EM] Aldean Ast, DO       EKG  Date/Time: 09/03/2018 12:54 AM  Performed by: Aldean Ast,  DO  Authorized by: Aldean AstMadore, Kevontay Burks J, DO     ECG reviewed by ED Physician in the absence of a cardiologist: yes    Comments:      EKG shows sinus rhythm with a rate of 84 beats per minute.  There is no concerning ST elevation.  When reviewing the previous EKG, no changes present.          NIH Stroke Scale

## 2018-09-28 LAB — CMP (EXT)
A/G Ratio (EXT): 1
ALT/SGPT (EXT): 81 U/L — ABNORMAL HIGH (ref 0–40)
AST/SGOT (EXT): 66 U/L — ABNORMAL HIGH (ref 0–37)
Albumin (EXT): 3.8 g/dL (ref 3.2–5.2)
Alkaline Phosphatase (EXT): 70 U/L (ref 39–117)
Anion Gap (EXT): 13 meq/L (ref 7–16)
BUN (EXT): 9 mg/dL (ref 6–19)
BUN/CREAT Ratio (EXT): 11.5
Bilirubin, Total (EXT): 0.3 mg/dL (ref 0.0–1.0)
CO2 (EXT): 24 meq/L (ref 21–30)
CalciumCalcium (EXT): 8.8 mg/dL (ref 8.6–10.4)
Chloride (EXT): 104 meq/L (ref 96–108)
Creatinine (EXT): 0.78 mg/dL (ref 0.50–1.30)
Globulin (EXT): 3.7 g/dL — ABNORMAL HIGH (ref 2.0–3.5)
Glucose (EXT): 181 mg/dL — ABNORMAL HIGH (ref 70–99)
Potassium (EXT): 3.8 meq/L (ref 3.3–5.3)
Protein (EXT): 7.5 g/dL (ref 5.9–8.4)
Sodium (EXT): 141 meq/L (ref 133–145)
eGFR - Creat MDRD (EXT): >60 (ref >60)
eGFR - Creat MDRD (EXT): >60 (ref >60)

## 2018-09-28 LAB — INR CARE EVERYWHERE: INR CARE EVERYWHERE: 1.1 (ref 0.9–1.2)

## 2018-10-02 LAB — CMP (EXT)
A/G Ratio (EXT): 1.3
ALT/SGPT (EXT): 80 U/L — ABNORMAL HIGH (ref 0–40)
AST/SGOT (EXT): 55 U/L — ABNORMAL HIGH (ref 0–37)
Albumin (EXT): 4.4 g/dL (ref 3.2–5.2)
Alkaline Phosphatase (EXT): 76 U/L (ref 39–117)
Anion Gap (EXT): 15 meq/L (ref 7–16)
BUN (EXT): 9 mg/dL (ref 6–19)
BUN/CREAT Ratio (EXT): 9.7
Bilirubin, Total (EXT): 0.3 mg/dL (ref 0.0–1.0)
CO2 (EXT): 26 meq/L (ref 21–30)
CalciumCalcium (EXT): 9.7 mg/dL (ref 8.6–10.4)
Chloride (EXT): 101 meq/L (ref 96–108)
Creatinine (EXT): 0.93 mg/dL (ref 0.50–1.30)
Globulin (EXT): 3.5 g/dL (ref 2.0–3.5)
Glucose (EXT): 206 mg/dL — ABNORMAL HIGH (ref 70–99)
Potassium (EXT): 4 meq/L (ref 3.3–5.3)
Protein (EXT): 7.9 g/dL (ref 5.9–8.4)
Sodium (EXT): 142 meq/L (ref 133–145)
eGFR - Creat MDRD (EXT): >60 (ref >60)
eGFR - Creat MDRD (EXT): >60 (ref >60)

## 2018-10-06 ENCOUNTER — Emergency Department
Admission: EM | Admit: 2018-10-06 | Discharge: 2018-10-06 | Disposition: A | Discharge status: Other Acute Inpatient Hospital | Payer: No Typology Code available for payment source | Source: Intra-hospital | Attending: Emergency Medicine | Admitting: Emergency Medicine

## 2018-10-06 ENCOUNTER — Emergency Department (HOSPITAL_BASED_OUTPATIENT_CLINIC_OR_DEPARTMENT_OTHER): Payer: No Typology Code available for payment source

## 2018-10-06 ENCOUNTER — Other Ambulatory Visit: Payer: Self-pay

## 2018-10-06 ENCOUNTER — Encounter (HOSPITAL_BASED_OUTPATIENT_CLINIC_OR_DEPARTMENT_OTHER): Payer: Self-pay

## 2018-10-06 DIAGNOSIS — R079 Chest pain, unspecified: Secondary | ICD-10-CM | POA: Diagnosis not present

## 2018-10-06 DIAGNOSIS — I10 Essential (primary) hypertension: Secondary | ICD-10-CM | POA: Diagnosis not present

## 2018-10-06 DIAGNOSIS — F17210 Nicotine dependence, cigarettes, uncomplicated: Secondary | ICD-10-CM | POA: Diagnosis present

## 2018-10-06 DIAGNOSIS — R110 Nausea: Secondary | ICD-10-CM | POA: Diagnosis not present

## 2018-10-06 DIAGNOSIS — I25110 Atherosclerotic heart disease of native coronary artery with unstable angina pectoris: Secondary | ICD-10-CM | POA: Diagnosis present

## 2018-10-06 DIAGNOSIS — R0789 Other chest pain: Secondary | ICD-10-CM | POA: Diagnosis present

## 2018-10-06 DIAGNOSIS — Z951 Presence of aortocoronary bypass graft: Secondary | ICD-10-CM | POA: Diagnosis not present

## 2018-10-06 DIAGNOSIS — I200 Unstable angina: Secondary | ICD-10-CM

## 2018-10-06 LAB — CMP (EXT)
ALT/SGPT (EXT): 73 U/L — ABNORMAL HIGH (ref 12–45)
AST/SGOT (EXT): 44 U/L — ABNORMAL HIGH (ref 8–34)
Albumin (EXT): 3.6 g/dL (ref 3.4–5.0)
Alkaline Phosphatase (EXT): 70 U/L (ref 45–117)
Anion Gap (EXT): 12 mmol/L (ref 5–15)
BUN (EXT): 14 mg/dL (ref 7–18)
Bilirubin, Total (EXT): 0.3 mg/dL (ref 0.2–1.0)
CO2 (EXT): 25 mmol/L (ref 21–32)
CalciumCalcium (EXT): 9.2 mg/dL (ref 8.5–10.1)
Chloride (EXT): 102 mmol/L (ref 98–107)
Creatinine (EXT): 1 mg/dL (ref 0.7–1.2)
Glucose (EXT): 207 mg/dL — ABNORMAL HIGH (ref 74–160)
Potassium (EXT): 4.1 mmol/L (ref 3.5–5.1)
Protein (EXT): 8 g/dL (ref 6.4–8.2)
Sodium (EXT): 139 mmol/L (ref 136–145)
eGFR - Creat CKD-EPI (EXT): >60 mL/min (ref >60)

## 2018-10-06 LAB — BMP (EXT)
Anion Gap (EXT): 11 mmol/L (ref 3–17)
BUN (EXT): 12 mg/dL (ref 8–25)
CO2 (EXT): 23 mmol/L (ref 23–32)
CalciumCalcium (EXT): 9.7 mg/dL (ref 8.5–10.5)
Chloride (EXT): 103 mmol/L (ref 98–108)
Creatinine (EXT): 0.88 mg/dL (ref 0.60–1.50)
GFR Estimated (Calc) (EXT): 107 mL/min/{1.73_m2} (ref >59)
Glucose (EXT): 166 mg/dL — ABNORMAL HIGH (ref 70–110)
Potassium (EXT): 4.8 mmol/L (ref 3.4–5.0)
Sodium (EXT): 137 mmol/L (ref 135–145)

## 2018-10-06 LAB — CBC, PLATELET & DIFFERENTIAL
ABSOLUTE BASO COUNT: 0 10*3/uL (ref 0.0–0.1)
ABSOLUTE EOSINOPHIL COUNT: 0.2 10*3/uL (ref 0.0–0.8)
ABSOLUTE IMM GRAN COUNT: 0.03 10*3/uL (ref 0.00–0.03)
ABSOLUTE LYMPH COUNT: 2.6 10*3/uL (ref 0.6–5.9)
ABSOLUTE MONO COUNT: 0.5 10*3/uL (ref 0.2–1.4)
ABSOLUTE NEUTROPHIL COUNT: 4.7 10*3/uL (ref 1.6–8.3)
ABSOLUTE NRBC COUNT: 0 10*3/uL (ref 0.0–0.0)
BASOPHIL %: 0.3 % (ref 0.0–1.2)
EOSINOPHIL %: 1.9 % (ref 0.0–7.0)
HEMATOCRIT: 38.7 % — ABNORMAL LOW (ref 40.1–51.0)
HEMOGLOBIN: 12.5 g/dL — ABNORMAL LOW (ref 13.7–17.5)
IMMATURE GRANULOCYTE %: 0.4 % (ref 0.0–0.4)
LYMPHOCYTE %: 32.4 % (ref 15.0–54.0)
MEAN CORP HGB CONC: 32.3 g/dL (ref 31.0–37.0)
MEAN CORPUSCULAR HGB: 30 pg (ref 26.0–34.0)
MEAN CORPUSCULAR VOL: 92.8 fl (ref 80.0–100.0)
MEAN PLATELET VOLUME: 10.2 fL (ref 8.7–12.5)
MONOCYTE %: 5.8 % (ref 4.0–13.0)
NEUTROPHIL %: 59.2 % (ref 40.0–75.0)
NRBC %: 0 % (ref 0.0–0.0)
PLATELET COUNT: 269 10*3/uL (ref 150–400)
RBC DISTRIBUTION WIDTH STD DEV: 46.6 fL — ABNORMAL HIGH (ref 35.1–46.3)
RED BLOOD CELL COUNT: 4.17 M/uL — ABNORMAL LOW (ref 4.60–6.10)
WHITE BLOOD CELL COUNT: 7.9 10*3/uL (ref 4.0–11.0)

## 2018-10-06 LAB — COVID-19 CARE EVERYWHERE: COVID-19 CARE EVERYWHERE: NEGATIVE — NL

## 2018-10-06 LAB — PROTHROMBIN TIME CARE EVERYWHERE
INR CARE EVERYWHERE: 1 — NL (ref 0.9–1.1)
PROTHROMBIN TIME CARE EVERYWHERE: 13 s — NL (ref 11.5–14.5)

## 2018-10-06 LAB — COMPREHENSIVE METABOLIC PANEL
ALANINE AMINOTRANSFERASE: 73 U/L — ABNORMAL HIGH (ref 12–45)
ALBUMIN: 3.6 g/dL (ref 3.4–5.0)
ALKALINE PHOSPHATASE: 70 U/L (ref 45–117)
ANION GAP: 12 mmol/L (ref 5–15)
ASPARTATE AMINOTRANSFERASE: 44 U/L — ABNORMAL HIGH (ref 8–34)
BILIRUBIN TOTAL: 0.3 mg/dL (ref 0.2–1.0)
BUN (UREA NITROGEN): 14 mg/dL (ref 7–18)
CALCIUM: 9.2 mg/dL (ref 8.5–10.1)
CARBON DIOXIDE: 25 mmol/L (ref 21–32)
CHLORIDE: 102 mmol/L (ref 98–107)
CREATININE: 1 mg/dL (ref 0.7–1.2)
ESTIMATED GLOMERULAR FILT RATE: >60 mL/min (ref >60)
Glucose Random: 207 mg/dL — ABNORMAL HIGH (ref 74–160)
POTASSIUM: 4.1 mmol/L (ref 3.5–5.1)
SODIUM: 139 mmol/L (ref 136–145)
TOTAL PROTEIN: 8 g/dL (ref 6.4–8.2)

## 2018-10-06 LAB — LIPASE: LIPASE: 119 U/L (ref 73–393)

## 2018-10-06 LAB — PROTHROMBIN TIME
INR: 1.1 (ref 2.0–3.5)
PROTHROMBIN TIME: 11.9 SECONDS (ref 9.6–12.3)

## 2018-10-06 LAB — TROPONIN I: TROPONIN I: 0.03 ng/mL (ref 0.00–0.04)

## 2018-10-06 LAB — MAGNESIUM: MAGNESIUM: 1.8 mg/dL (ref 1.8–2.4)

## 2018-10-06 LAB — APTT CARE EVERYWHERE: APTT CARE EVERYWHERE: 33.2 s — NL (ref 22.0–36.0)

## 2018-10-06 LAB — APTT: APTT: 28.2 SECONDS (ref 26.9–38.0)

## 2018-10-06 LAB — EKG

## 2018-10-06 MED ORDER — ONDANSETRON HCL 4 MG/2ML IJ SOLN
4.0000 mg | Freq: Once | INTRAMUSCULAR | Status: AC
Start: 2018-10-06 — End: 2018-10-06
  Administered 2018-10-06: 02:00:00 4 mg via INTRAVENOUS

## 2018-10-06 MED ORDER — HEPARIN SODIUM (PORCINE) 5000 UNIT/ML IJ SOLN
0.0000 [IU] | INTRAMUSCULAR | Status: DC
Start: 2018-10-06 — End: 2018-10-06

## 2018-10-06 MED ORDER — MORPHINE SULFATE 2 MG/ML IV SOLN (SUPER ERX)
2.00 mg | Freq: Once | Status: AC
Start: 2018-10-06 — End: 2018-10-06
  Administered 2018-10-06: 2 mg via INTRAVENOUS
  Filled 2018-10-06: qty 1

## 2018-10-06 MED ORDER — HEPARIN SODIUM (PORCINE) 5000 UNIT/ML IJ SOLN
4000.0000 [IU] | Freq: Once | INTRAMUSCULAR | Status: AC
Start: 2018-10-06 — End: 2018-10-06
  Administered 2018-10-06: 4000 [IU] via INTRAVENOUS
  Filled 2018-10-06: qty 1

## 2018-10-06 MED ORDER — ONDANSETRON HCL 4 MG/2ML IJ SOLN
INTRAMUSCULAR | Status: AC
Start: 2018-10-06 — End: 2018-10-06
  Administered 2018-10-06: 4 mg via INTRAVENOUS
  Filled 2018-10-06: qty 2

## 2018-10-06 MED ORDER — NITROGLYCERIN 0.4 MG SL SUBL
0.4000 mg | SUBLINGUAL_TABLET | Freq: Once | SUBLINGUAL | Status: AC
  Administered 2018-10-06: 02:00:00 0.4 mg via SUBLINGUAL

## 2018-10-06 MED ORDER — ASPIRIN 81 MG PO CHEW
CHEWABLE_TABLET | ORAL | Status: AC
Start: 2018-10-06 — End: 2018-10-06
  Administered 2018-10-06: 324 mg via ORAL
  Filled 2018-10-06: qty 4

## 2018-10-06 MED ORDER — HEPARIN SOD (PORCINE) IN D5W 25000-5 UT/500ML-% IV SOLN
0.0000 [IU]/h | INTRAVENOUS | Status: DC
Start: 2018-10-06 — End: 2018-10-06
  Administered 2018-10-06: 1000 [IU]/h via INTRAVENOUS
  Filled 2018-10-06: qty 500

## 2018-10-06 MED ORDER — FAMOTIDINE 20 MG/2ML IV SOLN
INTRAVENOUS | Status: AC
Start: 2018-10-06 — End: 2018-10-06
  Administered 2018-10-06: 20 mg via INTRAVENOUS
  Filled 2018-10-06: qty 2

## 2018-10-06 MED ORDER — ASPIRIN 81 MG PO CHEW
324.00 mg | CHEWABLE_TABLET | Freq: Once | ORAL | Status: AC
Start: 2018-10-06 — End: 2018-10-06
  Administered 2018-10-06: 02:00:00 324 mg via ORAL

## 2018-10-06 MED ORDER — NITROGLYCERIN 0.4 MG SL SUBL
SUBLINGUAL_TABLET | SUBLINGUAL | Status: AC
Start: 2018-10-06 — End: 2018-10-06
  Administered 2018-10-06: 0.4 mg via SUBLINGUAL
  Filled 2018-10-06: qty 2

## 2018-10-06 MED ORDER — FAMOTIDINE 20 MG/2ML IV SOLN
20.0000 mg | Freq: Once | INTRAVENOUS | Status: AC
Start: 2018-10-06 — End: 2018-10-06
  Administered 2018-10-06: 02:00:00 20 mg via INTRAVENOUS

## 2018-10-06 NOTE — ED Provider Notes (Signed)
The patient was seen primarily by me. ED nursing record was reviewed. Select prior records as available electronically through the Epic record were reviewed.    HPI:    Nicholas Salas is a 43 year old male patient with DM, CAD s/p MI (first age 68), s/p CABG (2007 [place]), s/p CABG 2012 [facility]), HTN, MVP, now 4d s/p diagnostic cath at [facility] (pt was visiting family paying respects after a cousin's death when he developed cp had cath Thu 5/14, d/c'd Fri 5/15, returned to [place] area Sat 5/16 staying at [facility] with relatives currently).    Pt presents c/o L cp radiating to LUE starting 20 min PTA similar to cp last week when had diagnostic cath at [facility]. +nausea, no diaphoresis. No back pain, no sob, no dizziness/lightheadedness, no palpitations, no abd'l pain.    On clopidogrel, metoprolol, baby ASA daily.  Took all of his usual medications for Sunday, 10/05/2018 except for baby ASA as he had run out of it.    Family Hx: +early CAD/MI/sudden death both parents MIs in their 36s    SocHx [10/06/18]: The patient lives in [place] (although current address listed is [place]). The patient is currently employed as a Optometrist for a bar.  - tobacco use: 3 cig daily  - EtOH: soc  - THC: no  - cocaine: never  - heroin/IVDU: never    ROS: Pertinent positives were reviewed as per the HPI above. All other systems were reviewed and are negative.  Nicholas Salas  Language of care: English  MRN: [id number]  PCP: None  Mode of arrival to ED: Self.  Chief complaint: Chest Pain    Past Medical History/Problem list:  Past Medical History:  No date: Acute myocardial infarction, unspecified site, episode of   care unspecified  No date: CAD (coronary artery disease)  No date: Hypercholesteremia  No date: MI (myocardial infarction) (HCC)  No date: Mitral valve prolapse  Patient Active Problem List:     Drug-seeking behavior    Past Surgical History: Past Surgical History:  2007: CABG  W/ARTERIAL GRAFT SINGLE ARTERIAL GRAFT  No date: PR MINIMALLY INVASIVE DIRECT CO  Social History:   Social History     Socioeconomic History   . Marital status: Single     Spouse name: Not on file   . Number of children: Not on file   . Years of education: Not on file   . Highest education level: Not on file   Occupational History   . Not on file   Social Needs   . Financial resource strain: Not on file   . Food insecurity:     Worry: Not on file     Inability: Not on file   . Transportation needs:     Medical: Not on file     Non-medical: Not on file   Tobacco Use   . Smoking status: Current Every Day Smoker     Packs/day: 0.25   . Smokeless tobacco: Never Used   Substance and Sexual Activity   . Alcohol use: Not on file   . Drug use: Not on file   . Sexual activity: Not on file   Lifestyle   . Physical activity:     Days per week: Not on file     Minutes per session: Not on file   . Stress: Not on file   Relationships   . Social connections:     Talks on phone: Not on  file     Gets together: Not on file     Attends religious service: Not on file     Active member of club or organization: Not on file     Attends meetings of clubs or organizations: Not on file     Relationship status: Not on file   . Intimate partner violence:     Fear of current or ex partner: Not on file     Emotionally abused: Not on file     Physically abused: Not on file     Forced sexual activity: Not on file   Other Topics Concern   . Not on file   Social History Narrative   . Not on file        Allergies: Review of Patient's Allergies indicates:   Ketorolac trometham*    Hives   Morphine and related    Anaphylaxis   Morphine sulfate-na*        Comment:Pt. States can take dilaudid and other narcotics   Motrin [ibuprofen]      Hives    Immunizations:   There is no immunization history on file for this patient.       Medications:  Prior to Admission Medications   Prescriptions Last Dose Informant Patient Reported? Taking?   ASPIRIN 81 MG OR  TABS   Yes No   Sig: daily   LIPITOR 20 MG OR TABS   Yes No   Sig: daily   METOPROLOL TARTRATE OR   Yes No   Sig: None Entered      Facility-Administered Medications: None     Physical Exam (ED Bed 11/11-A):   Patient Vitals for the past 999 hrs:   BP Temp Pulse Resp SpO2 Weight   10/06/18 0136 -- -- -- -- -- 111.1 kg (245 lb)   10/06/18 0119 159/93 98.4 F 86 20 99 % --     GENERAL:  WDWN, no acute distress, non-toxic   SKIN:  Warm & Dry, no rash, no petechiae or purpurae.  HEAD:  NCAT. Sclerae are anicteric and aninjected.  NECK:  Supple, no LAN.  LUNGS:  Clear to auscultation bilaterally. No wheezes, rales, rhonchi.   HEART:  RRR.  No murmurs, rubs, or gallops.   ABDOMEN:  Soft, NTND.  No masses.  No involuntary guarding or rebound.   EXTREMITIES:  No obvious deformities. No cyanosis. No edema. Negative Homans B.  NEUROLOGIC:  Alert; moves all extremities; speaking in clear fluent sentences.  PSYCHIATRIC:  Appropriate for age, time of day, and situation    Medications Given in the ED:    Medications   heparin infusion 25,000 units/500 mL D5W (1,000 Units/hr Intravenous New Bag 10/06/18 0209)     And   heparin (porcine) injection 0-5,500 Units (has no administration in time range)   morphine injection 2 mg (has no administration in time range)   nitroGLYCERIN (NITROSTAT) SL tablet 0.4 mg (0.4 mg Sublingual Given 10/06/18 0151)   aspirin chewable tablet 324 mg (324 mg Oral Given 10/06/18 0151)   famotidine (PEPCID) injection 20 mg (20 mg Intravenous Given 10/06/18 0152)   ondansetron (ZOFRAN) injection 4 mg (4 mg Intravenous Given 10/06/18 0152)   morphine injection 2 mg (2 mg Intravenous Given 10/06/18 0153)   heparin (porcine) injection 4,000 Units (4,000 Units Intravenous Given 10/06/18 0154)    Radiology Results:  See ED COURSE   Lab Results:     Labs Reviewed   COMPREHENSIVE METABOLIC PANEL - Abnormal; Notable for  the following components:       Result Value    Glucose Random 207 (*)     ASPARTATE AMINOTRANSFERASE 44  (*)     ALANINE AMINOTRANSFERASE 73 (*)     All other components within normal limits   MAGNESIUM   LIPASE   APTT    Narrative:     Is the patient on IV Heparin? (43)->No  Current Anticoagulant (57)->None  Current Anticoagulant->None   PROTHROMBIN TIME    Narrative:     Is the patient on IV Heparin? (43)->No  Current Anticoagulant (57)->None  Current Anticoagulant->None   TROPONIN I   CBC, PLATELET & DIFFERENTIAL        XR Chest Portable  Exam: Portable chest, 01:56    INDICATION: 43 years-old Male presents with chest pain, recent cath.    Comparison: 04/24/2018 through 07/16/2010.    Suboptimal due to portable AP technique.    FINDINGS:    Lines and tubes: None. Overlying leads.    Pleura: No pneumothorax visualized. No significant pleural effusions.     Lungs and airways: Subtle hazy opacification near the left lung base likely represents partial atelectasis. There is no pulmonary edema. There is stable mild elevation of the right hemidiaphragm.    Cardiac silhouette: Grossly stable. Status post previous CABG.    Mediastinum and Hila: Grossly stable.    Bones and soft tissues: Grossly stable. Status post previous median sternotomy. Metallic median sternotomy closure device is again noted.    IMPRESSION:    1.  No significant acute cardiopulmonary findings on portable chest radiograph.  2.  Status post previous CABG.         Reviewed and Electronically Signed by: Toney Reil MD   Signed Date/Time: 10-06-2018 02:26:12             Other Results and OLD/PRIOR records information and data (e.g. ECG, visual acuity):  See ED COURSE     ED Course and Medical Decision-making:    ED Course as of Oct 06 242   Mon Oct 06, 2018   0230 [facility] records viewable on [place] as [place].    Cath report found:  . Left Ventricle: Systolic function is normal with an ejection fraction   of 60-65%. The quantitative EF by 2D Simpson biplane is 65%.  . Left Ventricle: There is moderate concentric hypertrophy. The papillary    muscles are hypertrophied.  . Left Ventricle: Diastolic function is normal.  . Aortic Valve: There is mild regurgitation.  . Right Ventricle: Right ventricle cavity is dilated.  . Mitral Valve: There is mild regurgitation.  . Compared to the report on 12/25/2016, there is no significant change.      1610 Discussed with [facility]. Accepted to ED by my colleague, Dr. Forrest Moron.      0228 COBRA/EMTALA e-form completed. Pt verbally consents to transfer.      0219 Case discussed with [facility] cardiology (Dr. Rolm Baptise): recommend proceed with transfer to [facility] where primary cardiologist is in case of re-cath.      9604 No change after NTG x 2   heparin gtt   morphine 2 mg IV      0131 42yoM pt with sig cardiac hx s/p CABG x 2 and diagnostic cath 4d ago.      CXR   cbc, cmp, lipase, Mg, pt/ptt, trop   ntg   asa   ondansetron, famotidine      0130 ECG on  my reading: Sinus at 86.  Normal axis and intervals.  No evidence of chamber enlargement or ischemia.  Compared to December 2019, T wave flattening in 1 is new but TW I and L is the same.   EKG : Initial       Patient/family educated on diagnosis(es); he states understanding and agreement with plan of care.  He agrees with this plan and disposition.    Disposition: Transfer To [facility])    Condition on transfer: Improved and Stable    Diagnosis/Diagnoses:  Unstable angina pectoris (HCC)  Essential hypertension  Coronary artery disease involving native heart with unstable angina pectoris, unspecified vessel or lesion type (HCC)    Boone Gear Lai-Becker MD Lacie Scotts  This Emergency Department patient encounter note was created using voice-recognition software and in real time during the ED visit.

## 2018-10-06 NOTE — ED Triage Note (Signed)
Pt presents with left sided chest pain onset around 0100. Pain radiates down left arm. Associated with nausea and lightheadedness.  History MI and recent cardiac catheterization.

## 2018-10-06 NOTE — Narrator Note (Signed)
Patient Disposition  Patient education for diagnosis, medications, activity, diet and follow-up.  Patient left ED 3:04 AM.  Patient rep received written instructions.    Interpreter to provide instructions: No    Patient belongings with patient: YES    Have all existing LDAs been addressed? Yes    Have all IV infusions been stopped? Yes    Destination: Admit to:  [facility] ER

## 2018-10-06 NOTE — Narrator Note (Signed)
Report given to Clydie Braun, Charity fundraiser at [facility] ER

## 2018-10-20 LAB — PROTHROMBIN TIME CARE EVERYWHERE
INR CARE EVERYWHERE: 1.08 — NL (ref 0.8–1.2)
PROTHROMBIN TIME CARE EVERYWHERE: 13 s — NL (ref 9.2–13.5)

## 2018-10-26 LAB — PROTHROMBIN TIME CARE EVERYWHERE: INR CARE EVERYWHERE: 1 — NL (ref <1.3)

## 2018-11-12 LAB — UNMAPPED LAB RESULTS

## 2018-11-28 LAB — BMP (EXT)
Anion Gap (EXT): 13 mmol/L (ref 3–17)
BUN (EXT): 10 mg/dL (ref 6–20)
CO2 (EXT): 23 mmol/L (ref 22–32)
CalciumCalcium (EXT): 9.7 mg/dL (ref 8.9–10.3)
Chloride (EXT): 100 mmol/L (ref 98–107)
Creatinine (EXT): 0.97 mg/dL (ref 0.6–1.3)
GFR Estimated (Calc) (EXT): 97 mL/min/{1.73_m2} (ref 60–128)
Glucose (EXT): 259 mg/dL — ABNORMAL HIGH (ref 65–99)
Potassium (EXT): UNDETERMINED mmol/L (ref 3.6–5.1)
Sodium (EXT): 136 mmol/L (ref 136–145)

## 2018-11-28 LAB — UNMAPPED LAB RESULTS: Potassium (EXT): 4.3 mmol/L (ref 3.6–5.1)

## 2018-12-03 NOTE — Telephone Encounter (Signed)
No response from patient to schedule CT; order deferred.    Contact History:  12/03/18 No response; sent note to PCP; deferred  -11/06/18 LM and sent letter; f/u 51mo  -Auto call 6/16, 6/17, 6/18, no response to sched

## 2018-12-03 NOTE — Telephone Encounter (Signed)
Reviewed chart, CT Chest was ordered 4/7 but CTA Chest was completed on 4/14. This has already been reviewed with a plan to repeat in 3 months.  Dr Dorene Grebe, ok to cancel CT Chest order?

## 2018-12-06 LAB — UNMAPPED LAB RESULTS

## 2018-12-06 LAB — APTT CARE EVERYWHERE: APTT CARE EVERYWHERE: 26 s — NL (ref 22–36)

## 2018-12-06 LAB — PROTHROMBIN TIME CARE EVERYWHERE: INR CARE EVERYWHERE: 1 — NL (ref 0.9–1.1)

## 2018-12-07 NOTE — Telephone Encounter (Signed)
Yes, that's fine. Thank you

## 2018-12-08 NOTE — Telephone Encounter (Signed)
Order has been cancelled.

## 2018-12-12 ENCOUNTER — Ambulatory Visit: Admitting: Cardiovascular Disease

## 2018-12-12 ENCOUNTER — Emergency Department
Admit: 2018-12-12 | Disposition: A | Discharge status: Other Acute Inpatient Hospital | Source: Home / Self Care | Attending: Emergency Medicine | Admitting: Emergency Medicine

## 2018-12-12 ENCOUNTER — Observation Stay
Admit: 2018-12-12 | Discharge: 2018-12-12 | Discharge status: Against Medical Advice | Payer: No Typology Code available for payment source

## 2018-12-12 LAB — HX HEM-ROUTINE
HX BASO #: 0 10*3/uL (ref 0.0–0.2)
HX BASO: 1 %
HX EOSIN #: 0.1 10*3/uL (ref 0.0–0.5)
HX EOSIN: 2 %
HX HCT: 44.1 % (ref 37.0–47.0)
HX HGB: 14.5 g/dL (ref 13.5–16.0)
HX IMMATURE GRANULOCYTE#: 0 10*3/uL (ref 0.0–0.1)
HX IMMATURE GRANULOCYTE: 0 %
HX LYMPH #: 2 10*3/uL (ref 1.0–4.0)
HX LYMPH: 26 %
HX MCH: 29.9 pg (ref 26.0–34.0)
HX MCHC: 32.9 g/dL (ref 32.0–36.0)
HX MCV: 90.9 fL (ref 80.0–98.0)
HX MONO #: 0.5 10*3/uL (ref 0.2–0.8)
HX MONO: 6 %
HX MPV: 10.8 fL (ref 9.1–11.7)
HX NEUT #: 4.9 10*3/uL (ref 1.5–7.5)
HX NRBC #: 0 10*3/uL
HX NUCLEATED RBC: 0 %
HX PLT: 239 10*3/uL (ref 150–400)
HX RBC BLOOD COUNT: 4.85 M/uL (ref 4.20–5.50)
HX RDW: 13.1 % (ref 11.5–14.5)
HX SEG NEUT: 65 %
HX WBC: 7.6 10*3/uL (ref 4.0–11.0)

## 2018-12-12 LAB — HX TOXICOLOGY-DRUG, SERUM
HX ACETAMINOPHEN: <3 ug/mL — ABNORMAL LOW (ref 10–20)
HX BARBITUATES QL, SERUM: NOT DETECTED
HX BENZODIAZEPINES QL, SERUM: NOT DETECTED
HX ETHANOL: <10 mg/dL
HX SALICYLATE: <4 mg/dL — ABNORMAL LOW (ref 15.0–29.9)
HX TCA: NOT DETECTED

## 2018-12-12 LAB — HX CHEM-PANELS
HX ANION GAP: 11 (ref 3–14)
HX BLOOD UREA NITROGEN: 12 mg/dL (ref 6–24)
HX CHLORIDE (CL): 100 meq/L (ref 98–110)
HX CO2: 24 meq/L (ref 20–30)
HX CREATININE (CR): 1.33 mg/dL — ABNORMAL HIGH (ref 0.57–1.30)
HX GFR, AFRICAN AMERICAN: 76 mL/min/{1.73_m2}
HX GFR, NON-AFRICAN AMERICAN: 66 mL/min/{1.73_m2}
HX GLUCOSE: 386 mg/dL — ABNORMAL HIGH (ref 70–139)
HX POTASSIUM (K): 4.2 meq/L (ref 3.6–5.1)
HX SODIUM (NA): 135 meq/L (ref 135–145)

## 2018-12-12 LAB — HX COAGULATION
HX INR PT: 1.1 (ref 0.9–1.3)
HX PROTHROMBIN TIME: 12.3 s (ref 9.7–14.0)
HX PTT: 28.8 s (ref 25.7–35.7)

## 2018-12-12 LAB — HX CHEM-ENZ-FRAC: HX TROPONIN I: 0.02 ng/mL (ref 0.00–0.03)

## 2018-12-12 LAB — HX DIABETES: HX GLUCOSE: 386 mg/dL — ABNORMAL HIGH (ref 70–139)

## 2018-12-12 NOTE — ED Provider Notes (Signed)
 [name]  [name], [name]  MRN: [id number]  Age: 43 yrs  Sex: Male  DOB: November 06, 1975  Arrival Date: 12/12/2018  Arrival Time: 19:44  Account#: [id number]  Bed Pending Adult  PCP: None, Pcp  Chief Complaint: Chest Pain, Dizziness  .  Presentation:  07/24  19:48 Presenting complaint: Patient states: Pt reports approximately  jm42        20 minutes ago while seated in a car he suddenly felt LEFT        sided chest pain that radiates into his LEFT jaw and LEFT arm,        current pain 10/10. Pt reports dizziness started soon        afterwards and due to a history of MI's x3 and Bypass Surgery        x2 he presented to the hospital for evaluation. Pt reports        prior to this he has been feeling well. Pt reports taking        Aspirin 81 mg daily at bedtime, so he has not taken any today.  19:48 Method Of Arrival: Walk In                                      jm42  19:48 Acuity: Adult 2                                                 jm42  .  Historical:  - Allergies:  19:51 Codeine;                                                        jm42  19:51 Motrin;                                                         jm42  19:51 Toradol;                                                        jm42  19:51 Tramadol HCl;                                                   jm42  19:51 Coconut;                                                        jm42  - Home Meds:  19:51 aspirin 81 mg oral chew 1 tab  once daily [Active]; Lipitor Oral jm42        [Active]; Metoprolol Tartrate Oral [Active];  - PMHx:  19:51 CAD; High Cholesterol; mi x3;                                   jm42  - PSHx:  19:51 Cardiac Bypass Surgery x2; gallbladder surgery;                 jm42  .  - Family history: No immediate family members are acutely ill.  - Social history: Smoking status: Patient uses tobacco    products, current every day smoker. Patient uses alcohol but    reports only rare drinking. Patient/guardian denies using    street drugs, IV drugs.  -  The history from nurses notes was reviewed: and I agree with    what is documented.  .  .  Screening:  19:51 SEPSIS SCREENING - Temp > 38.3 or < 36.0 No - Heart Rate > 90   jm42        Yes - Respiratory > 20 No - SBP < 90 No SIRS Criteria (> = 2)        No. Fall Risk None identified. Exposure Risk/Travel Screening:        Any travel in last 14 daysquestion No. Fever or Respiratory Symptomsquestion  .  [name], [name]  [id number]  [id number]  Page 1 of 4  %%PAGE  .  [name], [name]  MRN: [id number]  Age: 86 yrs  Sex: Male  DOB: 17-Nov-1975  Arrival Date: 12/12/2018  Arrival Time: 19:44  Account#: [id number]  Bed Pending Adult  PCP: None, Pcp  Chief Complaint: Chest Pain, Dizziness  .        No. Known COVID 19 exposurequestion No. Have you Had COVID Testingquestion No.  23:09 Safety screen: Patient feels safe. Nutritional screening: No    rk10        deficits noted.  .  Vital Signs:  19:48 BP 110 / 69 Left Arm Sitting (auto/lg); Pulse 112; Resp 18      mb40        Spontaneous; Temp 37.0(O); Pulse Ox 97% on R/A; Pain 10/10;  20:12 BP 138 / 80; Pulse 94; Resp 21; Pulse Ox 98% on 2 lpm NC; Pain  dl7        9/10;  20:24 BP 135 / 82; Pulse 88; Resp 16; Pulse Ox 99% ;                  dl7  21:36 BP 111 / 60; Pulse 79; Resp 16; Pulse Ox 100% on 2 lpm NC; Pain dl7        7/10;  23:09 BP 124 / 79; Pulse 74; Resp 16; Pulse Ox 98% on R/A;            rk10  .  Vitals:  23:09 Rhythm normal sinus rhythm.                                     rk10  .  Glasgow Coma Score:  19:51 Eye Response: spontaneous(4). Verbal Response: oriented(5).     jm42        Motor Response: obeys commands(6). Total: 15.  20:13 Eye Response: spontaneous(4). Verbal Response: oriented(5).     dl7  Motor Response: obeys commands(6). Total: 15.  .  Triage Assessment:  19:51 General: Appears comfortable, well nourished, well groomed,     jm42        Behavior is appropriate for age, cooperative. Pain: Complains        of pain in chest, left  arm and neck Pain currently is 10/10        Pain began 30 min ago. Neuro: No deficits noted.        Cardiovascular: Capillary refill < 3 seconds Chest pain is        described as Pain is 10 out of 10 on a pain scale. Respiratory:        Airway is patent Respiratory effort is even, unlabored,        Respiratory pattern is regular, symmetrical. GI: Abdomen is        flat, non- distended. Skin: Skin is intact, is healthy with        good turgor, Skin is pink, warm / dry.  .  Assessment:  20:13 General: Appears uncomfortable, Behavior is appropriate for     dl7        age, cooperative. Pain: Complains of pain in chest the pain        radiates to the: Pain radiates to left arm Pain At worst was        10/10. Neuro: No deficits noted. Respiratory: Airway is patent        Respiratory effort is even, unlabored, Respiratory pattern is        regular, symmetrical. GI: Reports nausea. Skin: No deficits  .  [name], [name]  [id number]  [id number]  Page 2 of 4  %%PAGE  .  [name], [name]  MRN: [id number]  Age: 70 yrs  Sex: Male  DOB: November 04, 1975  Arrival Date: 12/12/2018  Arrival Time: 19:44  Account#: [id number]  Bed Pending Adult  PCP: None, Pcp  Chief Complaint: Chest Pain, Dizziness  .        noted.  .  Observations:  19:44 Patient arrived in ED.                                          nf1  19:47 Registration completed.                                         bo2  19:47 Patient Visited By: Gillis Santa                               bo2  19:51 Triage Completed.                                               jm42  19:55 Patient Visited By: Teresita Madura                           jrc  22:09 Patient assigned to A6  rc5  22:22 Patient assigned to A6                                          rc5  23:06 Patient Visited By: Jolayne Panther                               [phone]  23:09 Patient Visited By: Jolayne Panther                               [phone]  23:38 Patient Visited By:  Earlie Server  .  Procedure:  20:01 EKG done. (by ED staff). Old EKG Obtained Reviewed By: Hedda Slade        Mostofi DO.  20:10 Inserted saline lock: 20 gauge in right forearm.                ac21  20:23 EKG done. (by ED staff). Old EKG Obtained Reviewed By: Redmond School DO.  21:36 COVID-19 PCR (Asymptomatic admission screen) Sent.              dl7  .  Dispensed Medications:  20:10 Not Given (Other Intervention Used): Lopressor 5 mg IVP once    jrc  20:10 Drug: Lopressor 2.5 mg Route: IVP;                              dl7  20:12 Drug: fentaNYL (PF) 25 mcg Route: IV;                           dl7  20:12 Drug: Aspirin 81 mg Route: PO;                                  dl7  21:02 Drug: NS - Sodium Chloride 0.9% IV ml 1000 mL Route: IV; Rate:  ac21        Bolus;  21:36 Drug: fentaNYL (PF) 25 mcg Route: IV;                           dl7  .  .  Interventions:  20:04 ECG/EKG Scanned into Chart                                      vs6  20:22 ECG/EKG Scanned into Chart                                      vs6  20:26 Demo Sheet Scanned into Chart  vs6  22:21 Patient Belongings Scanned into Chart                           vs6  .  Outcome:  22:08 Decision to Hospitalize by Provider.                            jrc  23:38 Admitted to [place] [facility] accompanied by tech, via stretcher, with    rk10        chart. Condition: good Condition: stable. Smoking cessation  .  [name], [name]  [id number]  [id number]  Page 3 of 4  %%PAGE  .  [name], [name]  MRN: [id number]  Age: 31 yrs  Sex: Male  DOB: [birth year]  Arrival Date: 12/12/2018  Arrival Time: 19:44  Account#: [id number]  Bed Pending Adult  PCP: None, Pcp  Chief Complaint: Chest Pain, Dizziness  .        instructions given to patient/caregiver. Discharge Assessment:        Patient awake, alert and oriented x 3. No cognitive and/or        functional deficits noted. Patient verbalized  understanding of        disposition instructions. Chart Status Nursing note complete        and electronically signed.  23:39 Patient left the ED.                                            rk10  .  Corrections: (The following items were deleted from the chart)  19:53 19:51 PMHx: Hypertension; jm42                                  jm42  19:53 19:51 PMHx: Diabetes - IDDM; jm42                               jm42  .  Signatures:  Synetta Fail                              RN   [address] [place]                           RN   ac21  Shellia Cleverly                         RN   jm42  Cassandria Santee                          RN   rc5  Jolayne Panther                           RN   [address] [place]                               vs6  Courtney Paris  [address] [place]                               jr26  [place], [place]                       PA-C jrc  [place], [place]                       CCT  mb40  Gillis Santa                           Reg  bo2  .  .  .  .  .  .  .  .  .  .  .  .  .  .  .  .  .  .  .  .  [name], [name]  [id number]  [id number]  Page 4 of 4  .  %%END

## 2018-12-12 NOTE — ED Provider Notes (Signed)
 [name]  Name: [name], [name]  MRN: [id number]  Age: 43 yrs  Sex: Male  DOB: 1975/06/09  Arrival Date: 12/12/2018  Arrival Time: 19:44  Account#: [id number]  .  Working Diagnosis: Chest pain, unspecified  PCP: None, Pcp  .  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 He reports he was in the car when all        of a sudden he started to develop left sided chest pain that        radiating to his jaw and down his left arm. He reports feels        nauseous, lightheaded, and feels like he's going to pass out.        He reports similar symptoms in the past with a previous MI. He        reports prior to this he felt asymptomatic. He reports he        smokes 2 cigarettes a day and drinks alcohol very rarely. He        reports compliance with his medications. He does not follow up        with a cardiologist and travels between [place], [place], and [place]. .  .  Historical:  - Allergies: Codeine; Motrin; Toradol; Tramadol HCl; Coconut;  - Home Meds: aspirin 81 mg oral chew 1 tab once daily; Lipitor Oral;    Metoprolol Tartrate Oral;  - PMHx: CAD; High Cholesterol; mi x3;  - PSHx: Cardiac Bypass Surgery x2; gallbladder surgery;  - Family history: No immediate family members are acutely ill.  - Social history: Smoking status: Patient uses tobacco    products, current every day smoker. Patient uses alcohol but    reports only rare drinking. Patient/guardian denies using    street drugs, IV drugs.  - The history from nurses notes was reviewed: and I agree with    what is documented.  .  .  ROS:  21:20 Constitutional: Negative for fever and  chills  Eyes: Negative   jrc        for injury, pain, redness, and discharge  ENT/Mouth: Negative        for difficulty swallowing, sore throat, and nasal congestion  .  Neck: Negative for pain  Cardiovascular: Positive for chest  .  Name:[name], Alinda [name]  [id number]  [id number]  Page 1 of 7  %%PAGE  .  Name: [name], [name]  MRN: [id number]  Age: 70 yrs  Sex: Male  DOB: [birth year]  Arrival Date: 12/12/2018  Arrival Time: 19:44  Account#: [id number]  .  Working Diagnosis: Chest pain, unspecified  PCP: None, Pcp  .        pain radiating to the jaw and left arm  Respiratory: Negative        for shortness of breath and cough  Abdomen/GI: Positive for  nausea. Negative for abdominal pain, vomiting, and diarrhea  .  Back: Negative for pain  GU: Negative for urinary        symptoms  MS/extremity: Negative for injury  Skin/Breast:        Negative for rash  Neuro: Positive for generalized weakness.        Negative for headache  Psych: Negative for substance        abuse  Allergy/Immunology: Positive for allergies  .  [name]  Vital Signs:  19:48 BP 110 / 69 Left Arm Sitting (auto/lg); Pulse 112; Resp 18      mb40        Spontaneous; Temp 37.0(O); Pulse Ox 97% on R/A; Pain 10/10;  20:12 BP 138 / 80; Pulse 94; Resp 21; Pulse Ox 98% on 2 lpm NC; Pain  dl7        9/10;  20:24 BP 135 / 82; Pulse 88; Resp 16; Pulse Ox 99% ;                  dl7  21:36 BP 111 / 60; Pulse 79; Resp 16; Pulse Ox 100% on 2 lpm NC; Pain dl7        7/10;  23:09 BP 124 / 79; Pulse 74; Resp 16; Pulse Ox 98% on R/A;            rk10  .  Glasgow Coma Score:  19:51 Eye Response: spontaneous(4). Verbal Response: oriented(5).     jm42        Motor Response: obeys commands(6). Total: 15.  20:13 Eye Response: spontaneous(4). Verbal Response: oriented(5).     dl7        Motor Response: obeys commands(6). Total: 15.  .  Exam:  21:21 Constitutional: The patient appears well-appearing obese black  jrc        male who appears comfortable, non toxic, NAD.  Head:  NC/AT.        Eyes: clear conjunctivae.  ENT: Moist oral mucosa, voice        normal.  .  Name:[name], Alinda [name]  [id number]  [id number]  Page 2 of 7  %%PAGE  .  Name: [name], [name]  MRN: [id number]  Age: 67 yrs  Sex: Male  DOB: 06-04-1975  Arrival Date: 12/12/2018  Arrival Time: 19:44  Account#: [id number]  .  Working Diagnosis: Chest pain, unspecified  PCP: None, Pcp  .  Neck: Supple, non tender.  Respiratory: CTAB, no        respiratory distress, speaks in full sentences.  CVS: RRR,        normal S1 and S2  Abdomen: Well  healed midline incision. Soft,        obese, non-tender, non-distended, normal BS, no g/r.  Skin:        Normal temperature  Psych: Cooperative, answers questions        appropriately.  Neuro: Awake and alert, no facial asymmetry, no        gross focal deficits. Moves all 4 extremities. Ambulates with a        steady gait.  .  .  MDM:  21:29 ED course: Patient is a 43 year old male with a significant     jrc        cardiac history who presents complaining of chest pain that        started 20 minutes prior to arrival. EKG with no ischemic        changes from previous. Labs show elevated Cr to  1.33 and a        glucose of 386. Initial troponin negative. Patient had a CTA in        05/2017 which showed no coronary atherosclerosis and a coronary        calcium score of 0. I discussed the case with the cardiology        fellow who recommended admission to the medicine service. No        repeat CTA needed at this time per cardiology. Patient does        have a history of substance abuse and pain seeking. Patient        does travel to several states ([place], [place], and [place]) requesting pain        medication. Patient will be admitted with a medicine service.        Patient understands and agrees with the plan. .  21:37 Data reviewed: EKG, lab test result(s), [facility] [place] PAT database   jrc        was reviewed. and showed: 11/28/18 Percocet #8, 11/23/18 Vicodin        #3, 6/22 Oxycodone #4, 11/07/18 Vicodin #5 all  from [place]. Patient        has several prescriptions from , [place], and [place]. old medical        records, vital signs. Counseling: I had a detailed discussion        with the patient and/or guardian regarding: the historical        points, exam findings, and any diagnostic results supporting        the admission diagnosis.  07/25  00:05 My portion of the ED chart is complete / electronically signed: jrc        Teresita Madura PA-C.  [name]  Name:Dearinger, Juanya  [id number]  [id number]  Page 3 of 7  %%PAGE  .  Name: Cullin, Dishman  MRN: [id number]  Age: 51 yrs  Sex: Male  DOB: [birth year]  Arrival Date: 12/12/2018  Arrival Time: 19:44  Account#: [id number]  .  Working Diagnosis: Chest pain, unspecified  PCP: None, Pcp  .  07/24  19:46 Order name: Wyvonnia Dusky; Complete Time: 20:08             dispa  t  07/24  19:46 Order name: EDIE_CAREPLAN; Complete Time: 20:08                 dispa  t  07/24  19:56 Order name: BUN (Blood Urea Nitrogen); Complete Time: 20:55     jrc  07/24  19:56 Order name: CBC/Diff (With Plt); Complete Time: 20:38           jrc  07/24  19:56 Order name: CR (Creatinine); Complete Time: 20:55               jrc  07/24  19:56 Order name: GLU (Glucose); Complete Time: 20:55                 jrc  07/24  19:56 Order name: LYTES (Na, K, Cl, Co2); Complete Time: 20:55        jrc  07/24  19:56 Order name: PT (Prothrombin Time With INR); Complete Time: 20:38jrc  07/24  19:56 Order name: PTT; Complete Time: 20:38                           jrc  07/24  19:56 Order name: Troponin I; Complete Time: 20:55  jrc  07/24  20:12 Order name: Tox Screen (Urine)                                  jrc  07/24  20:46 Order name: GFR, AA; Complete Time: 20:55                       dispa  t  07/24  20:46 Order name: GFR, NAA; Complete Time: 20:55                      dispa  t  07/24  20:46 Order name: Serum Drug Screen; Complete Time: 20:55             dispa  t  07/24  19:54 Order name: Adult EKG (order  using folder); Complete Time: 19:54jm42  07/24  19:54 Order name: EKG (order using folder); Complete Time: 19:55      jm42  07/24  19:56 Order name: Cardiac Monitor; Complete Time: 20:06               jrc  07/24  19:56 Order name: Pulse Oximetry Continuous; Complete Time: 20:06     jrc  07/24  20:11 Order name: Adult EKG (order using folder); Complete Time: 20:11jrc  07/24  20:11 Order name: EKG (order using folder); Complete Time: 20:12      jrc  07/24  20:11 Order name: ADD Tox Screen - Serum; Complete Time: 20:15        jrc  .  Name:Schlichting, Buell  [id number]  [id number]  Page 4 of 7  %%PAGE  .  Name: Abdirizak, Richison  MRN: [id number]  Age: 35 yrs  Sex: Male  DOB: 1975-06-25  Arrival Date: 12/12/2018  Arrival Time: 19:44  Account#: [id number]  .  Working Diagnosis: Chest pain, unspecified  PCP: None, Pcp  .  07/24  21:28 Order name: COVID-19 PCR (Asymptomatic admission screen)        jrc  07/24  22:16 Order name: Patient Belongings List; Complete Time: 22:21       rc5  .  Dispensed Medications:  07/24  20:10 Not Given (Other Intervention Used): Lopressor 5 mg IVP once    jrc  20:10 Drug: Lopressor 2.5 mg Route: IVP;                              dl7  20:12 Drug: fentaNYL (PF) 25 mcg Route: IV;                           dl7  20:12 Drug: Aspirin 81 mg Route: PO;                                  dl7  21:02 Drug: NS - Sodium Chloride 0.9% IV ml 1000 mL Route: IV; Rate:  ac21        Bolus;  21:36 Drug: fentaNYL (PF) 25 mcg Route: IV;                           dl7  .  .  Attending Notes:  20:13 Attestation: Assessment and care plan reviewed with  mm10/  lc28        resident/midlevel provider. See their note for details.        Physician Assistant's history reviewed, patient interviewed and        examined. Attending HPI: Social History: Smoker Drinks alcohol        HPI: 43 y/o male pt w PMHx CAD, HLD, MI x3 presents for        evaluation of sudden onset of left sided chest pain w        associated  dizziness prior to arrival. He states he was in his        car when the pain began and reports it radiates into his left        jaw and left arm. Pt states he feels like passing out and        rates the pain 10/10 which he reports are similar to his        previous MIs. He also Of note, he had 2 Bypass surgery. He also        takes 81 mg ASA daily at bedtime but has not taken any today. I        have reviewed the Nurses Notes.  20:14 Attending ROS My personal review of system reveals              mm10/  lc28        Constitutional: No fevers, no chills Skin: No rash Eye: Vision        unchanged ENT: No sore throat Respiratory: No cough, no        shortness of breath Cardiovascular: + chest pain, no        palpitations, no diaphoresis Gastrointestinal: No abdominal        pain, + nausea, no vomiting, no diarrhea Genitourinary: No        dysuria Musculoskeletal: No back pain Neurological: No        headache, + dizziness.  20:14 Attending Exam: My personal exam reveals Constitutional:        mm10/  lc28        Well-developed, well nourished, NAD. Head: NC/AT. Eyes: PERRL,        EOMI ENT: Normal external nose and ears, posterior OP benign.        Neck: Supple, full ROM, no cervical LAD. Pulmonary: CTAB.  .  Name:Petterson, Caster  [id number]  [id number]  Page 5 of 7  %%PAGE  .  Name: Sriram, Febles  MRN: [id number]  Age: 89 yrs  Sex: Male  DOB: 17-Dec-1975  Arrival Date: 12/12/2018  Arrival Time: 19:44  Account#: [id number]  .  Working Diagnosis: Chest pain, unspecified  PCP: None, Pcp  .        Chest: No chest wall tenderness. CVS: RRR, normal S1 and S2, no        m/r/g. Abdomen: Soft, non-tender, non-distended, normal bowel        sounds, midline scar from previous bypass surgeries. Back: No        MLT, no CVAT, full ROM. Extremities: NVI, full ROM, pulses        equal. Skin: Warm, dry, no rashes. Neuro: awake and alert,        lucid mentation, face symmetric. Psych: A/Ox3, behavior, mood,        and affect are  within normal limits.  21:39 Lab/Ancillary show: Labs were reviewed and interpreted by me:   mm10/  lc28  Glucose 386, Creatinine 1.33, Troponin negative, Acetaminophen        <3, Salicylate <4.0 EKG interpreted by me and shows: NSR, no ST        T wave elevations.  23:07 ED Course: Pt w PMHx CAD, MI x3 presents for evaluation of      mm10/  lc28        chest pain. Physical exam was without any acute findings. I        reviewed his ECHO which had an injection fracture of 55%. Labs        were significant for Creatinine 1.33, Glucose 386 and negative        Troponin. EKG was interpreted by me and showed NSR, no ST T        wave elevations. Due to the concern of his current symptoms and        past history, he will be admitted in stable condition to        Medical services. Pt understood and agreed w his admission        plans.  23:09 My Working Impression: 1. Chest pain. Scribe Scribe Chart       mm10/  lc28        Complete By signing my name below, Ladonna Snide, attest that this        documentation has been prepared under the direction and in the        presence of Dr. Metro Kung, D.O. Electronically signed by Ladonna Snide, ED Scribe, 12/12/2018.  [name]  Disposition Summary:  12/12/18 22:08  Hospitalization Ordered        Hospitalization Status: Observation                             jrc        Provider: Doylene Bode                                       jrc        Location: Adult Floor                                           jrc        Condition: Stable                                               jrc        Problem: new                                                    jrc        Symptoms: have improved                                         jrc        Bed/Room Type: Regular  jrc        Room Assignment: [facility] 6(12/12/18 22:22)                        rc5        Diagnosis          - Chest pain, unspecified                                     jrc        Forms:           - Handoff Communication Form                                  jrc  Signatures:  .  Name:Izard, Nikolaus  [id number]  [id number]  Page 6 of 7  %%PAGE  .  Name: Shamon, Cothran  MRN: [id number]  Age: 54 yrs  Sex: Male  DOB: 20-Feb-1976  Arrival Date: 12/12/2018  Arrival Time: 19:44  Account#: [id number]  .  Working Diagnosis: Chest pain, unspecified  PCP: None, Pcp  .  Dispatcher, [employer]                          dispa  Synetta Fail                              RN   dl7  Claiborne Billings, Amy                           RN   ac21  Shellia Cleverly                         RN   jm42  Cassandria Santee                          RN   rc5  Jolayne Panther                           RN   rk10  [place], Vanessa                               vs6  Teresita Madura                       PA-C jrc  [place], [place]                           Reg  bo2  Ladonna Snide, Scribe                       [place]  .  Corrections: (The following items were deleted from the chart)  19:53 19:51 PMHx: Hypertension; jm42                                  jm42  19:53 19:51 PMHx: Diabetes - IDDM; jm42  jm42  21:35 21:29 ED course: Patient is a 43 year old male with a           jrc        significant cardiac history who presents complaining of chest        pain that started 20 minutes prior to arrival. EKG with no        ischemic changes from previous. Labs show a mildly elevated Cr.        Normal troponin. . jrc  22:09 22:08 jrc                                                       rc5  22:22 22:09 *PENDING BED* rc5                                         rc5  23:12 21:39 Lab/Ancillary show: EKG interpreted by me and shows: NSR, mm10/  lc28        no ST T wave elevations mm10/lc28  23:12 23:07 ED Course: Pt w PMHx CAD, MI x3 presents for evaluation   mm10/  lc28        of chest pain. Physical exam was without any acute findings. I        reviewed his ECHO which had an injection fracture of 55%. EKG        was interpreted by me and showed  NSR, no ST T wave elevations.        Due to the concern of his current symptoms and past history, he        will be admitted in stable condition to Medical services. Pt        understood and agreed w his admission plans mm10/lc28  .  Document is preliminary until electronically or manually signed by the atte  nding physician  .  .  .  .  .  .  .  .  .  .  .  Name:Norby, Filemon  [id number]  [id number]  Page 7 of 7  .  %%END

## 2018-12-13 LAB — HX MICRO-RESP VIRAL PANEL: HX COVID-19 (SARS-COV-2) ADMIT SCREEN: NEGATIVE

## 2018-12-29 ENCOUNTER — Emergency Department: Admit: 2018-12-29 | Discharge status: Against Medical Advice | Source: Ambulatory Visit

## 2018-12-29 ENCOUNTER — Ambulatory Visit: Admitting: Emergency Medicine

## 2018-12-29 ENCOUNTER — Emergency Department
Admit: 2018-12-29 | Discharge: 2018-12-29 | Discharge status: Against Medical Advice | Payer: No Typology Code available for payment source

## 2018-12-29 LAB — HX COAGULATION
HX INR PT: 1.1 (ref 0.9–1.3)
HX PROTHROMBIN TIME: 12.4 s (ref 9.7–14.0)
HX PTT: 28 s (ref 25.7–35.7)

## 2018-12-29 LAB — HX HEM-ROUTINE
HX BASO #: 0 10*3/uL (ref 0.0–0.2)
HX BASO: 0 %
HX EOSIN #: 0.1 10*3/uL (ref 0.0–0.5)
HX EOSIN: 2 %
HX HCT: 40.2 % (ref 37.0–47.0)
HX HGB: 13.1 g/dL — ABNORMAL LOW (ref 13.5–16.0)
HX IMMATURE GRANULOCYTE#: 0 10*3/uL (ref 0.0–0.1)
HX IMMATURE GRANULOCYTE: 0 %
HX LYMPH #: 1.8 10*3/uL (ref 1.0–4.0)
HX LYMPH: 29 %
HX MCH: 29.5 pg (ref 26.0–34.0)
HX MCHC: 32.6 g/dL (ref 32.0–36.0)
HX MCV: 90.5 fL (ref 80.0–98.0)
HX MONO #: 0.4 10*3/uL (ref 0.2–0.8)
HX MONO: 7 %
HX MPV: 10.8 fL (ref 9.1–11.7)
HX NEUT #: 3.8 10*3/uL (ref 1.5–7.5)
HX NRBC #: 0 10*3/uL
HX NUCLEATED RBC: 0 %
HX PLT: 207 10*3/uL (ref 150–400)
HX RBC BLOOD COUNT: 4.44 M/uL (ref 4.20–5.50)
HX RDW: 13.2 % (ref 11.5–14.5)
HX SEG NEUT: 62 %
HX WBC: 6.1 10*3/uL (ref 4.0–11.0)

## 2018-12-29 LAB — HX CHEM-PANELS
HX ANION GAP: 7 (ref 3–14)
HX BLOOD UREA NITROGEN: 11 mg/dL (ref 6–24)
HX CHLORIDE (CL): 99 meq/L (ref 98–110)
HX CO2: 27 meq/L (ref 20–30)
HX CREATININE (CR): 1.33 mg/dL — ABNORMAL HIGH (ref 0.57–1.30)
HX GFR, AFRICAN AMERICAN: 76 mL/min/{1.73_m2}
HX GFR, NON-AFRICAN AMERICAN: 66 mL/min/{1.73_m2}
HX GLUCOSE: 554 mg/dL — AB (ref 70–139)
HX POTASSIUM (K): 4.3 meq/L (ref 3.6–5.1)
HX SODIUM (NA): 133 meq/L — ABNORMAL LOW (ref 135–145)

## 2018-12-29 LAB — HX TRANSFUSION

## 2018-12-29 LAB — HX DIABETES: HX GLUCOSE: 554 mg/dL — AB (ref 70–139)

## 2018-12-29 LAB — HX POINT OF CARE: HX GLUCOSE-POCT: 398 mg/dL — ABNORMAL HIGH (ref 70–139)

## 2018-12-29 LAB — HX CHEM-ENZ-FRAC
HX TROPONIN I: 0.01 ng/mL (ref 0.00–0.03)
HX TROPONIN I: 0.02 ng/mL (ref 0.00–0.03)

## 2018-12-29 NOTE — ED Provider Notes (Signed)
[name]  [name], [name]  MRN: [id number]  Age: 43 yrs  Sex: Male  DOB: [birth year]  Arrival Date: 12/29/2018  Arrival Time: 15:51  Account#: [id number]  .  Working Diagnosis: Chest pain, unspecified,  Hyperglycemia, unspecified  PCP:  .  HPI:  08/10  16:52 This 43 yrs old Black Male presents to ER via EMS with          ps14        complaints of Chest Pain - lightheaded.  16:52 Pt is a 43yo M with PMH of MI ( 2007,2012,2013), s/p CABGx2     ps14        (2007 at [facility] and 2012 at [facility]) and HLD who presented        with complaints of chest pain. Reports that CP started 45        minutes PTA. Reports that pain is L sided, 9/10, feels like        stabbing and radiates down L arm. Reports pain began while        walking and got worse. 911 was called. Given ASA PTA. Denies        pain is worse with SOB. Denies cough or any ill contacts.        Recently tested for COVID with PCP 2 weeks ago and reports that        he tested negative. Has established cardiac care at [facility] and int        he process of moving his care to [facility]. Was admitted here for        CP on 12/12/18. However, once he arrived to floor, pt left AMA.        He reports that he got in an argument with a doctor and that is        why he decided to leave. Plans to stay today should the        indication to be admitted arises..  .  Historical:  - Allergies: motrin, tramadol, torodol; nitro sensative dumps his bp;  - Home Meds: lipitor asa lopressor;  - PMHx: cardiac hx;  - PSHx: cabg x2, chole;  - Social history: Smoking status: Patient uses tobacco    products, current every day smoker. Preferred Language:    English.  - The history from nurses notes was reviewed: and I agree with    what is documented.  .  .  ROS:  18:43 Constitutional: Negative for General: Denies any fevers,        ps14        chills, body aches, or fatigue  Head: Denies HA, Migraines, or        Trauma  Eyes: Denies visual changes  ENT: Denies ear pain,        congestion, runny nose,or   sore throat  Neck: Denies any pain,        masses, or injury  Lungs: Denies SOB or cough  .  Name:Hurley, Ladarren  [id number]  [id number]  Page 1 of 10  %%PAGE  .  Name: Kaivon, Livesey  MRN: [id number]  Age: 67 yrs  Sex: Male  DOB: 12/15/1975  Arrival Date: 12/29/2018  Arrival Time: 15:51  Account#: [id number]  .  Working Diagnosis: Chest pain, unspecified,  Hyperglycemia, unspecified  PCP:  .  Heart: Reports CP,        Denies palpitations or edema  GI: Reports nausea. Denies Abd        pain, vomiting  or diarrhea, hematemesis, constipation,        black/tarry stools, rectal bleeding  Urinary: Denies urinary        frequency, burning, or urgency  MS: Denies muscle or joint aches        and pains  Neuro: Denies Headaches, lightheadedness, dizziness,        or vision changes.  .  .  .  Vital Signs:  15:54 BP 106 / 66; Pulse 78; Resp 20; Temp 36.2(O); Pulse Ox 96% on   lk        R/A; Pain 9/10;  16:11 BP 104 / 58; Pulse 75; Resp 18; Pulse Ox 98% on R/A; Pain 9/10; lk  16:54 BP 109 / 57; Pulse 70; Resp 19; Pulse Ox 94% on R/A; Pain 7/10; lk  17:00 BP 97 / 57; Pulse 65; Resp 17; Pulse Ox 95% on R/A;             jm60  17:23 Weight 127.01 kg;                                               km34  18:00 BP 101 / 49; Pulse 62; Resp 19; Pulse Ox 95% on R/A;            jm60  19:14 BP 105 / 53; Pulse 65; Resp 20; Pulse Ox 96% on R/A;            lk  .  Glasgow Coma Score:  15:54 Eye Response: spontaneous(4). Verbal Response: oriented(5).     lk        Motor Response: obeys commands(6). Total: 15.  17:00 Eye Response: spontaneous(4). Verbal Response: oriented(5).     jm60        Motor Response: obeys commands(6). Total: 15.  18:00 Eye Response: spontaneous(4). Verbal Response: oriented(5).     jm60        Motor Response: obeys commands(6). Total: 15.  18:18 Eye Response: spontaneous(4). Verbal Response: oriented(5).     jm60        Motor Response: obeys commands(6). Total: 15.  .  Exam:  19:16 Constitutional: The patient  appears Vitals: VS wnl.  General:    ps14        A/O x3, awake, well developed, and nourished. No acute        respiratory distress.  Skin: warm, pink, and dry without rash.  .  Head: No signs of injury/deformity. No raccoon eyes, battle  .  Name:Spaugh, Hoby  [id number]  [id number]  Page 2 of 10  %%PAGE  .  Name: Zyshawn, Bohnenkamp  MRN: [id number]  Age: 75 yrs  Sex: Male  DOB: [birth year]  Arrival Date: 12/29/2018  Arrival Time: 15:51  Account#: [id number]  .  Working Diagnosis: Chest pain, unspecified,  Hyperglycemia, unspecified  PCP:  .        signs, or tenderness.  Eyes: Anicteric. PERRLA.  ENT:No        epistaxis.  Neck: Supple. No lymphadenopathy or JVD. Trachea        midline. No TTP of C spine.  Lungs: CTAB. No wheezing, rales,        or rhonchi.  Heart: RRR. No rubs, gallops, or murmurs. No chest        wall TTP.  Abd: Bowel sounds present on all quadrants. Soft,  non-tender and non-distended. No hernias or masses visualized.        No guarding.  Back: No midline TTP down spine. No CVAT        bilaterally.  MSK/Extremities: Calves: Non-tender and equal in        circumference. No pedal edema, calf tenderness, redness, or        warmth.  Neuro:  Oriented to person, place, and time. Able to        follow commands. Moves all fours. Clear speech.  Psych: Behavior        is cooperative and pleasant. Affect: Calm.  .  .  MDM:  19:11 ED course: Transfer of care to PA [facility] at 7pm shift       ps14        change.  19:20 ED course: Pt is a 43yo M with PMH of MI ( 2007,2012,2013), s/p ps14        CABGx2 (2007 at [facility] and 2012 at [facility]) and HLD who        presented with complaints of chest pain. Reported that pain        feels the same as it did during prior MIs.Given morphine and        zofran for symptom control. Pt decline sublingual nitro and        paste. Reports that his cardiologist at [facility] told him not to        take it as it drops his BP too low. He Declined to try nitro        paste  as it makes his skin itchy and never works. EKG with no        evidence of MI. Once pt was seen, outpatient notes were        reviewed: Per last cardio note from 05/24/17, there is concern of        pt always reporting allergy to non opioid medications and him        frequently asking for opioid meds to treat his chronic chest        pain.MassPAT reviewed and pt has multiple prescriptions from        multiple prescribers. Placed in ED obs for serial trops and        EKgs. Heart score of 3. Labs negative trop of .01. Cr of 1.33.  .  Name:Athens, Arshdeep  [id number]  [id number]  Page 3 of 10  %%PAGE  .  Name: Sargon, Scouten  MRN: [id number]  Age: 17 yrs  Sex: Male  DOB: [birth year]  Arrival Date: 12/29/2018  Arrival Time: 15:51  Account#: [id number]  .  Working Diagnosis: Chest pain, unspecified,  Hyperglycemia, unspecified  PCP:  .        NS ordered to help with AKI. Glucose of 554. Reports that he        was told by PCP that he is no longer diabetic and thus has not        been on his Metformin for 6 months. Per Dr Tiajuana Amass, last        metformin prescription filled was in march. Given 10 units of        regular insulin which brought glucose down to 398. Additional        NS fluids ordered. Pt reported that he continues with CP.        Appears in no acute distress, watching tv, and comfortable in  Bed. Opted to not provide narcotic medication due hx in MassPat        and cardio notes. He declined tylenol and said that he can live        with the pain "as he always does" ( making it possible that he        is currently complaining of his chronic chest pain). Reports        allergy to toradol, tramadol, and IBU. Transfer of care to PA        [facility] at 7pm shift change pending improvement of glucose,        hydration for AKI, and 2nd troponin and EKG results. May need        to be started/referred to PCP to re initiate glucose control        meds. .  19:28 Data reviewed: EMS record, lab test result(s),  nurses notes,    ps46        old medical records, radiologic studies, vital signs.  19:36 My portion of the ED chart is complete / electronically signed: ps14        Belinda Block, PA-C.  .  08/10  16:00 Order name: Wyvonnia Dusky; Complete Time: 16:40             dispa  t  08/10  16:01 Order name: EDIE_CAREPLAN; Complete Time: 16:40                 dispa  t  08/10  16:10 Order name: BUN (Blood Urea Nitrogen); Complete Time: 16:47     ps14  08/10  16:10 Order name: CBC/Diff (With Plt); Complete Time: 16:40           ps14  08/10  16:10 Order name: CR (Creatinine); Complete Time: 16:47               ps14  08/10  16:10 Order name: GLU (Glucose); Complete Time: 17:07                 ps14  08/10  16:10 Order name: LYTES (Na, K, Cl, Co2); Complete Time: 16:47        ps14  08/10  16:10 Order name: PT (Prothrombin Time With INR); Complete Time: 16:40ps14  08/10  16:10 Order name: PTT; Complete Time: 16:40                           ps14  08/10  16:10 Order name: Troponin I; Complete Time: 16:47                    ps14  08/10  .  Name:Platner, Leovardo  [id number]  [id number]  Page 4 of 10  %%PAGE  .  Name: Candace, Ramus  MRN: [id number]  Age: 24 yrs  Sex: Male  DOB: Mar 27, 1976  Arrival Date: 12/29/2018  Arrival Time: 15:51  Account#: [id number]  .  Working Diagnosis: Chest pain, unspecified,  Hyperglycemia, unspecified  PCP:  .  16:26 Order name: Blood Bank Hold; Complete Time: 16:40               dispa  t  08/10  16:45 Order name: GFR, NAA; Complete Time: 16:47                      dispa  t  08/10  16:45 Order name: GFR, AA; Complete Time: 16:47  dispa  t  08/10  16:47 Order name: Troponin I: 3 hrs from initial                      ps14  08/10  16:10 Order name: Adult EKG (order using folder); Complete Time: 16:10ps14  08/10  16:10 Order name: Cardiac Monitor; Complete Time: 16:11               ps14  08/10  16:10 Order name: EKG (order using folder); Complete Time: 16:11      ps14  08/10  16:10  Order name: Pulse Oximetry Continuous; Complete Time: 16:13     ps14  08/10  16:26 Order name: Refer to ED-OBS; Complete Time: 16:33               ny2  08/10  16:46 Order name: Diet, Clear liquid; Complete Time: 16:53            ps14  08/10  16:46 Order name: IV Saline Lock; Complete Time: 16:53                ps14  08/10  16:46 Order name: Serial EKG q 3 Hrs X 2; Complete Time: 16:53        ps14  08/10  16:46 Order name: Serial Troponin q 3 Hrs X 2; Complete Time: 16:54   ps14  08/10  16:47 Order name: Adult EKG (order using folder): 3 HRS from initial; ps14        Complete Time: 16:47  08/10  16:47 Order name: EKG (order using folder); Complete Time: 16:53      ps14  08/10  18:08 Order name: AccuCheck(POC); Complete Time: 18:38                ny2  .  ECG:  16:49 Rate is 76 beats/min. Rhythm is regular. QRS interval is        ny2        normal. QT interval is normal. T waves are Normal. No ST        changes noted.  .  Dispensed Medications:  16:33 Drug: morphine 4 mg Route: IVP;                                 lk  17:02 Follow up: Response: Pain is decreased; No Adverse Reaction; No lk        Adverse Reaction pain down to a 7  .  Name:Scarlett, Damire  [id number]  [id number]  Page 5 of 10  %%PAGE  .  Name: Monterio, Bob  MRN: [id number]  Age: 54 yrs  Sex: Male  DOB: 03-Dec-1975  Arrival Date: 12/29/2018  Arrival Time: 15:51  Account#: [id number]  .  Working Diagnosis: Chest pain, unspecified,  Hyperglycemia, unspecified  PCP:  .  16:33 Drug: Zofran 4 mg Route: IVP;                                   lk  16:54 Drug: NS - Sodium Chloride 0.9% IV ml 1000 mL Route: IV; Rate:  lk        Bolus;  17:01 Follow up: Response: Nausea is decreased; No Adverse Reaction   lk  18:53 Follow up: IV Intake:  jm60  17:36 Drug: Insulin Regular Human 10 units {Co-Signature: kh21        jm60        (Kaitlin Hagstrom RN).} Route: Sub-Q; Site: left lower abdomen;  18:53 Drug: NS - Sodium  Chloride 0.9% IV ml 1000 mL Route: IV; Rate:  jm60        Bolus;  [name]  [name]  Attending Notes:  16:22 Attestation: Assessment and care plan reviewed with             ny2        resident/midlevel provider. See their note for details.        Physician Assistant's history reviewed, patient interviewed and        examined. Attending HPI: HPI: 43 yo male pmh of cad cabg MI in        the past c/o L sided cp sharp radiating to LUE onset 45 min pta        while walking ; given asa by ems; continued pain.  16:23 Attending HPI: Social History: Smoker Nondrinker. I have        ny2        reviewed the Nurses Notes, Old Records in: [employer]. [employer],        Summary of the review of old record shows: last cardiology        visit on 05/24/17 Division of Cardiology Patient Name: Aarsh, Fristoe Medical Record [id number] #: Date of Birth: 1975/10/07        Visit Date: 05/24/2017 . [name] Beaulah Dinning, M.D. Cardiology        Division, [facility] [place], [place] [address] . RE: Demyan, Fugate Date of Visit: 05/24/17 . Dear Dr. Enis Gash: . I am        dictating this letter to summarize the hospitalization of Mr.        Oluwaseyi Raffel in anticipation of outpatient follow-up. 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 The anomalous coronary        artery was discovered when the patient presented in 2007 with a        chest pain syndrome and later underwent coronary artery bypass        graft surgery in which the right internal mammary artery was        sewn into the right coronary artery. The patient continued to        have chest discomfort and was evaluated some years later in        [facility] where the RIMA graft was found to be        atretic. The patient then underwent successful cardiac surgery         including reimplantation of the coronary artery on the right.        Unfortunately, the patient has chronic and recurring chest        pain. The patient has visited numerous emergency rooms and he  .  Name:Simkins, Seydou  [id number]  [id number]  Page 6 of 10  %%PAGE  .  Name:  Radley, Barto  MRN: [id number]  Age: 43 yrs  Sex: Male  DOB: May 25, 1975  Arrival Date: 12/29/2018  Arrival Time: 15:51  Account#: [id number]  .  Working Diagnosis: Chest pain, unspecified,  Hyperglycemia, unspecified  PCP:  .        presented to the [facility] Emergency Room with        symptoms of chest heaviness. He report allergies or        intolerances to non-opioid medications and has fairly specific        requests for pain control medications. Troponin biomarkers and        electrocardiogram were unremarkable. To further clarify the        likelihood that the chest pain syndrome may be secondary to        cardiac ischemia or coronary dysfunction the patient underwent        a cardiac CT scan. The CT scan showed no evidence of coronary        atherosclerosis with a coronary calcium score of zero. The        right coronary artery ostium was patent at the reimplantation        site and no coronary artery stenosis was identified. Grossly        normal left and right ventricular systolic function was        identified. The CT scan also demonstrated areas of adenopathy        as well as pulmonary mosaic attenuation, which is a nonspecific        finding. The pulmonary artery was found to be dilated and        suggestive of pulmonary hypertension. [name] Upon receiving the        result of the CT scan and negative cardiac biomarker levels, we        discharged the patient home with a recommendation for        outpatient followup of the adenopathy and pulmonary findings.        The patient requested an oral narcotic medication on discharge        for pain management. Survey of the MassPAT program found that        he had  received numerous prescriptions for narcotics from        different prescribers recently. We elected to provide no        further narcotic prescription. . Thank you very much for the        opportunity to care for Mr. Lelon Perla. . . . Sincerely, . .        Cyndee Brightly, MD, Cardiology Attending Dictated by: Cyndee Brightly, MD . Electronically Signed Cyndee Brightly, MD        05/28/2017 08:18 A . . .  16:49 Lab/Ancillary show: EKG interpreted by me and shows:.           ny2  17:06 Attending Exam: My personal exam reveals neck supple skin warm  ny2        dry mmm Constitutional: awake alert Head/Face: NCAT; voice        normal Respiratory: CTA bil; good air mvmt bil; no rales /        rhonchi / wh; no retractions; no accessory muscle use; no        stridor; speaks in full sentences without difficulty.        Cardiovascular: RR +S1 +S2 ;  no pulse deficits Psych: affect        normal, answers questions appropriately; cooperative        Abdomen/GI: Bowel sounds: normal, active, Palpation: abdomen is        soft and non-tender, Back: pain, is absent, ROM is normal,        Neuro: Orientation: is normal, Mentation: is normal, Motor:        strength is normal, Skin: Turgor: is excellent, MS/Extremity:        Circulation: circulation is intact in all extremities, Calves:        are non-tender, have equal circumference, Psych: Behavior/mood  .  Name:Swenson, Keyden  [id number]  [id number]  Page 7 of 10  %%PAGE  .  Name: Mekiah, Ronneby  MRN: [id number]  Age: 82 yrs  Sex: Male  DOB: 1976/04/21  Arrival Date: 12/29/2018  Arrival Time: 15:51  Account#: [id number]  .  Working Diagnosis: Chest pain, unspecified,  Hyperglycemia, unspecified  PCP:  .        is cooperative.  17:07 Lab/Ancillary show: Labs were reviewed and interpreted by me:   ny2        glucose 554; Cr. 1.33; troponin 0.01. ED Course: elevated        glucosse / Cr. Referred to ED obs for IV hydration, serial ekg        / troponin / iv hydration  /glucose control.  .  Disposition Summary:  12/29/18 20:02  Left Against Medical Advice        Location: Home(12/29/18 20:02)                                  ny2        Problem: new(12/29/18 20:02)                                    ny2        Symptoms: are unchanged(12/29/18 20:02)                         ny2        Condition: Fair(12/29/18 20:02)                                 ny2        Diagnosis          - Chest pain, unspecified(12/29/18 20:02)                     ny2          - Hyperglycemia, unspecified                                  ny2  Signatures:  Dispatcher, [employer]                          dispa  Barrie Folk                            RN   lk  Vivien Presto  MD   ny2  Octavia Heir                         RN   [address]                      BSN  jm60  [place], [place]                            PA-C ps14  April Manson RN                          [phone]  .  Corrections: (The following items were deleted from the chart)  15:56 15:54 Home Meds: tramadol, motrin; lk                           lk  16:46 16:44 ps14                                                      mt18  18:02 16:52 Pt is a 43yo M with PMH of MI ( 2007,2012,2013), s/p      ps14        CABGx2 (2007 at [facility] and 2012 at [facility]) and HLD who        presented with complaints of chest pain. Reports that CP        started 45 minutes PTA. Reports that pain is L sided, 9/10,        feels like stabbing and radiates down L arm. Reports pain began        while walking and got worse. 911 was called. Denies pain is        worse with SOB. Denies cough or any ill contacts. Recently        tested for COVID with . ps14  18:45 18:43 Constitutional: Negative for ps14                         ps14  18:57 17:07 ED Course: elevated glucosse / Cr. Referred to ED obs for ny2        IV hydration, serial ekg / troponin ny2  19:16 18:43 Constitutional: Negative for ps14                         ps14  19:20 16:52 Pt is a  43yo M with PMH of MI ( 2007,2012,2013), s/p      ps14        CABGx2 (2007 at [facility] and 2012 at [facility]) and HLD who  .  Name:Esh, Lash  [id number]  [id number]  Page 8 of 10  %%PAGE  .  Name: Elijha, Dedman  MRN: [id number]  Age: 36 yrs  Sex: Male  DOB: November 30, 1975  Arrival Date: 12/29/2018  Arrival Time: 15:51  Account#: [id number]  .  Working Diagnosis: Chest pain, unspecified,  Hyperglycemia, unspecified  PCP:  .        presented with complaints of chest pain. Reports that CP        started 45 minutes PTA. Reports that pain is L sided, 9/10,  feels like stabbing and radiates down L arm. Reports pain began        while walking and got worse. 911 was called. Given ASA PTA.        Denies pain is worse with SOB. Denies cough or any ill        contacts. Recently tested for COVID with PCP 2 weeks ago and        reports that he tested negative. Has established cardiac care        at [facility] and int he process of moving his care to [facility]. . ps14  19:20 16:52 Pt is a 43yo M with PMH of MI ( 2007,2012,2013), s/p      ps14        CABGx2 (2007 at [facility] and 2012 at [facility]) and HLD who        presented with complaints of chest pain. Reports that CP        started 45 minutes PTA. Reports that pain is L sided, 9/10,        feels like stabbing and radiates down L arm. Reports pain began        while walking and got worse. 911 was called. Given ASA PTA.        Denies pain is worse with SOB. Denies cough or any ill        contacts. Recently tested for COVID with PCP 2 weeks ago and        reports that he tested negative. Has established cardiac care        at [facility] and int he process of moving his care to [facility]. Was        admitted here for CP on 12/12/18. However, once he arrived to        floor, pt left AMA. He reports that he got in an argument with        a doctor and that is why he decided to leave. Plans to stay        today should the indication to be admitted arises.[name] ps14  19:21 19:20 ED course: Pt  is a 43yo M with PMH of MI (                ps14        2007,2012,2013), s/p CABGx2 (2007 at [facility] and 2012 at [facility]) and HLD who presented with complaints of chest pain. [name]        ps14  19:21 18:43 Constitutional: Negative for General: Denies any fevers,  ps14        chills, body aches, or fatigue Head: Denies HA, Migraines, or        Trauma Eyes: Denies visual changes ENT: Denies ear pain,        congestion, runny nose,or sore throat Neck: Denies any pain,        masses, or injury Lungs: Denies SOB or cough Heart: Reports CP,        Denies palpitations or edema GI: Denies Abd pain, NVD,        hematemesis, constipation, black/tarry stools, rectal bleeding        Urinary: Denies urinary frequency, burning, or urgency MS:        Denies muscle or joint aches and pains Neuro: Denies Headaches,        lightheadedness, dizziness, or vision changes. , ps14  19:27 19:20 ED course: Pt is a 43yo M with PMH of  MI (                ps14        2007,2012,2013), s/p CABGx2 (2007 at [facility] and 2012 at [facility]) and HLD who presented with complaints of chest pain.        Reported that pain feels the same as it did during prior        MIs.Given morphine and zofran on arrival. . ps14  19:36 19:20 ED course: Pt is a 43yo M with PMH of MI (                ps14  .  Name:Graziani, Abanoub  [id number]  [id number]  Page 9 of 10  %%PAGE  .  Name: Kellie, Chisolm  MRN: [id number]  Age: 50 yrs  Sex: Male  DOB: 1976/04/19  Arrival Date: 12/29/2018  Arrival Time: 15:51  Account#: [id number]  .  Working Diagnosis: Chest pain, unspecified,  Hyperglycemia, unspecified  PCP:  .        2007,2012,2013), s/p CABGx2 (2007 at [facility] and 2012 at [facility]) and HLD who presented with complaints of chest pain.        Reported that pain feels the same as it did during prior        MIs.Given morphine and zofran for symptom control. Pt decline        sublingual nitro and paste. Reports that his cardiologist at         [facility] [place] told him not to take it as it drops his BP too low. He        Declined to try nitro paste as it makes his skin itchy and        never works. Per last cardio note review from 05/24/17, there is        concern of pt always reporting allergy to non opioid        medications and him frequently asking for opioid meds to treat        his pain. EKG with no evidence of MI. Labs negative trop of        .01. Heart score of 3. Cr of 1.33. NS ordered to help with AKI.        Glucose of 554. Reports that he was told by PCP that he is no        longer diabetic and thus has not been on his Metformin for 6        months. Per Dr Tiajuana Amass last metformin prescription filled was in        march. Given 10 units of regular insulin which brought glucose        down to 398. Additional NS fluids ordered. Pt reported that he        continues with CP. He declined tylenol and said that he can        live with the pain "as he always does". . ps14  20:01 16:44 Observation ps14                                          ny2  20:01 16:44 Vivien Presto ps14  ny2  20:01 16:44 ED Obsv ps14                                              ny2  20:01 16:44 Stable ps14                                               ny2  20:01 16:44 an ongoing problem ps14                                   ny2  20:01 16:44 are unchanged ps14                                        ny2  20:01 16:44 Regular ps14                                              ny2  20:01 16:44 Chest pain, unspecified ps14                              ny2  20:01 16:46 ED Obs mt18                                               ny2  .  Document is preliminary until electronically or manually signed by the atte  nding physician  .  .  .  .  .  .  .  .  .  .  .  .  .  Name:Wisinski, Valeriano  [id number]  [id number]  Page 10 of 10  .  %%END

## 2018-12-29 NOTE — ED Provider Notes (Signed)
 [name]  [name], [name]  MRN: [id number]  Age: 43 yrs  Sex: Male  DOB: 03-Dec-1975  Arrival Date: 12/29/2018  Arrival Time: 15:51  Account#: [id number]  Bed A1  PCP:  Chief Complaint: Chest Pain - lightheaded  .  Presentation:  08/10  15:52 Presenting complaint: EMS states: Pt here with c/o chest pain   lk        and lightheartedness, on his way to a hotel. Pt has substeranl        cp non reproducable , some diaphoresis, and nausea associated,        Denies any sob. EKG neg for acute changes, but does have sig        cardiac hx in the past including two cabg's , both after        similar stories. Pt got 4 baby asa in field , iv infiltrated..  15:52 Method Of Arrival: EMS: [facility]                              lk  15:52 Acuity: Adult 2                                                 lk  .  Historical:  - Allergies:  15:54 motrin, tramadol, torodol;                                      lk  15:54 nitro sensative dumps his bp;                                   lk  - Home Meds:  15:54 lipitor asa lopressor [Active];                                 lk  - PMHx:  15:54 cardiac hx;                                                     lk  - PSHx:  15:54 cabg x2, chole;                                                 lk  .  - Social history: Smoking status: Patient uses tobacco    products, current every day smoker. Preferred Language:    English.  - The history from nurses notes was reviewed: and I agree with    what is documented.  .  .  Screening:  15:54 SEPSIS SCREENING - Temp > 38.3 or < 36.0 No - Heart Rate > 90   lk        No - Respiratory > 20 No - SBP < 90 No Does this patient have a        suspected source  of infection at this timequestion No SIRS Criteria (>        = 2) No. Safety screen: Patient feels safe. Suicide (ED Safe)        Screening: In the past two weeks have you felt down, depressed        or hopelessquestion No. Suicidal Thoughts: Over the past two weeks        patient DENIES thoughts of killing self.  Fall Risk None        identified. Exposure Risk/Travel Screening: Any travel in last        14 daysquestion No. Fever or Respiratory Symptomsquestion No. Known COVID 19        exposurequestion No. Have you Had COVID Testingquestion Yes. yes [place] two        weeks ago and was neg.  [name]  [name], [name]  [id number]  [id number]  Page 1 of 4  %%PAGE  .  [name], [name]  MRN: [id number]  Age: 69 yrs  Sex: Male  DOB: 1975-10-08  Arrival Date: 12/29/2018  Arrival Time: 15:51  Account#: [id number]  Bed A1  PCP:  Chief Complaint: Chest Pain - lightheaded  .  Vital Signs:  15:54 BP 106 / 66; Pulse 78; Resp 20; Temp 36.2(O); Pulse Ox 96% on   lk        R/A; Pain 9/10;  16:11 BP 104 / 58; Pulse 75; Resp 18; Pulse Ox 98% on R/A; Pain 9/10; lk  16:54 BP 109 / 57; Pulse 70; Resp 19; Pulse Ox 94% on R/A; Pain 7/10; lk  17:00 BP 97 / 57; Pulse 65; Resp 17; Pulse Ox 95% on R/A;             jm60  17:23 Weight 127.01 kg;                                               km34  18:00 BP 101 / 49; Pulse 62; Resp 19; Pulse Ox 95% on R/A;            jm60  19:14 BP 105 / 53; Pulse 65; Resp 20; Pulse Ox 96% on R/A;            lk  .  Glasgow Coma Score:  15:54 Eye Response: spontaneous(4). Verbal Response: oriented(5).     lk        Motor Response: obeys commands(6). Total: 15.  17:00 Eye Response: spontaneous(4). Verbal Response: oriented(5).     jm60        Motor Response: obeys commands(6). Total: 15.  18:00 Eye Response: spontaneous(4). Verbal Response: oriented(5).     jm60        Motor Response: obeys commands(6). Total: 15.  18:18 Eye Response: spontaneous(4). Verbal Response: oriented(5).     jm60        Motor Response: obeys commands(6). Total: 15.  .  Triage Assessment:  15:54 General: Appears uncomfortable, Behavior is cooperative. Pain:  lk        Complains of pain in substeranl cp Pain currently is 9/10.  .  Assessment:  18:17 General: Appears in no apparent distress.                       jm60  18:18 General:  Appears in no apparent distress, pt c/o cp s/s "like  jm60        my first heart attack" today while walking to train, pta. pt        denies sob or cough.. Pain: Complains of pain in chest.        Cardiovascular: Rhythm is sinus bradycardia. Respiratory: No        deficits noted. Airway is patent Respiratory effort is even,        unlabored, Respiratory pattern is regular, symmetrical. Skin:        Skin is dry, Skin is normal, Skin temperature is warm.  .  Observations:  15:51 Patient arrived in ED.                                          lk  15:54 Triage Completed.                                               lk  16:00 Patient Visited ByGarald Braver, Vernie Murders  16:14 Patient Visited By: Vivien Presto                             ny2  16:44 Patient Visited By: Belinda Block                                ps14  16:46 Patient assigned to A1                                          mt18  20:01 Nurse's Note: Pt was dissconnected to go to br. Pt expressed he lk  .  [name], [name]  [id number]  [id number]  Page 2 of 4  %%PAGE  .  [name], [name]  MRN: [id number]  Age: 41 yrs  Sex: Male  DOB: 12/07/1975  Arrival Date: 12/29/2018  Arrival Time: 15:51  Account#: [id number]  Bed A1  PCP:  Chief Complaint: Chest Pain - lightheaded  .        may not want to stay. Upon arriving back in room found [name]        on bed and iv on bed. Pt left room before getting his second        trop result. Both results were neg.  .  Procedure:  16:10 BUN (Blood Urea Nitrogen) Sent.                                 lk  16:10 CBC/Diff (With Plt) Sent.                                       lk  16:10 CR (Creatinine) Sent.  lk  16:10 GLU (Glucose) Sent.                                             lk  16:10 LYTES (Na, K, Cl, Co2) Sent.                                    lk  16:11 PT (Prothrombin Time With INR) Sent.                            lk  16:11 PTT Sent.                                                        lk  16:11 Troponin I Sent.                                                lk  16:11 Labs drawn. (by ED staff). Sent per order to lab. Inserted      lk        peripheral IV: saline lock: 18 gauge in right antecubital area.  16:12 EKG done. (by ED staff). Old EKG Obtained Reviewed By: Rod Holler MD.  19:22 Troponin I: 3 hrs from initial Sent.                            lk  .  Dispensed Medications:  16:33 Drug: morphine 4 mg Route: IVP;                                 lk  17:02 Follow up: Response: Pain is decreased; No Adverse Reaction; No lk        Adverse Reaction pain down to a 7  16:33 Drug: Zofran 4 mg Route: IVP;                                   lk  16:54 Drug: NS - Sodium Chloride 0.9% IV ml 1000 mL Route: IV; Rate:  lk        Bolus;  17:01 Follow up: Response: Nausea is decreased; No Adverse Reaction   lk  18:53 Follow up: IV Intake:                                    jm60  17:36 Drug: Insulin Regular Human 10 units {Co-Signature: kh21        jm60        (Kaitlin Hagstrom RN).} Route: Sub-Q; Site: left lower abdomen;  18:53 Drug: NS - Sodium Chloride 0.9% IV ml 1000 mL Route: IV; Rate:  jm60        Bolus;  [name]  [name]  Intake:  18:53 IV: 1000.54ml; Total: 1000.66ml.                                jm60  .  Interventions:  16:04 Demo Sheet Scanned into Chart                                   aa37  16:12 ECG/EKG Scanned into Chart                                      mm12  17:05 Critical Test Result Result(s): GLU 554 MD/PA Reported to and   mt18        with read back: MD Walker Kehr.  .  [name], [name]  [id number]  [id number]  Page 3 of 4  %%PAGE  .  [name], [name]  MRN: [id number]  Age: 32 yrs  Sex: Male  DOB: Mar 05, 1976  Arrival Date: 12/29/2018  Arrival Time: 15:51  Account#: [id number]  Bed A1  PCP:  Chief Complaint: Chest Pain - lightheaded  .  18:18 Armband on Placed in gown Call light in reach Bed in low        jm60         position Side Rail up X 2. Cardiac monitor on. Pulse ox on.        NIBP on.  18:29 POC Test Accucheck POC is 398.                                  jm60  .  Outcome:  16:44 Decision to Hospitalize by Provider.                            ps14  20:01 Hospitalize undone.                                             ny2  20:02 Patient left against medical advice.                            ny2  20:02 Patient left the ED.                                            ny2  .  Corrections: (The following items were deleted from the chart)  15:56 15:54 Home Meds: tramadol, motrin; lk                           lk  17:01 16:54 BP 109 / 57; Pulse 70bpm; Resp 19bpm; Pulse Ox 94% RA; lk lk  .  Signatures:  Barrie Folk                            RN   lk  Jenne Campus, [place]  Doran Stabler                         MD   ny2  Octavia Heir                         RN   mt18  Rodena Medin, PharmD               Pharmkm34  Felicita Gage                      BSN  jm60  [place], [place]                            PA-C ps14  K[facility]by Fam                       CCT  lm24  Bella Kennedy                         Sec  aa37  April Manson RN                          [phone]  .  .  .  .  .  .  .  .  .  .  .  .  .  .  .  .  .  Name:Dennington, Ann  [id number]  [id number]  Page 4 of 4  .  %%END

## 2018-12-30 LAB — BMP (EXT)
Anion Gap (EXT): 12 mmol/L (ref 3–17)
BUN (EXT): 10 mg/dL (ref 8–25)
CO2 (EXT): 23 mmol/L (ref 23–32)
CalciumCalcium (EXT): 9.3 mg/dL (ref 8.5–10.5)
Chloride (EXT): 101 mmol/L (ref 98–108)
Creatinine (EXT): 0.97 mg/dL (ref 0.60–1.50)
GFR Estimated (Calc) (EXT): 97 mL/min/{1.73_m2} (ref >59)
Glucose (EXT): 341 mg/dL — ABNORMAL HIGH (ref 70–110)
Potassium (EXT): 5.4 mmol/L — ABNORMAL HIGH (ref 3.4–5.0)
Sodium (EXT): 136 mmol/L (ref 135–145)

## 2018-12-31 ENCOUNTER — Ambulatory Visit: Admitting: Internal Medicine

## 2019-01-01 ENCOUNTER — Ambulatory Visit: Admitting: Cardiovascular Disease

## 2019-01-01 ENCOUNTER — Ambulatory Visit

## 2019-01-01 LAB — COVID-19 CARE EVERYWHERE: COVID-19 CARE EVERYWHERE: NOT DETECTED — NL

## 2019-01-05 ENCOUNTER — Inpatient Hospital Stay
Admit: 2019-01-05 | Discharge: 2019-01-05 | Discharge status: Against Medical Advice | Payer: PRIVATE HEALTH INSURANCE | Attending: Registered"

## 2019-01-05 ENCOUNTER — Emergency Department: Admit: 2019-01-05 | Payer: PRIVATE HEALTH INSURANCE | Primary: Family Medicine

## 2019-01-05 DIAGNOSIS — R079 Chest pain, unspecified: Secondary | ICD-10-CM

## 2019-01-05 LAB — PROTHROMBIN TIME CARE EVERYWHERE
INR CARE EVERYWHERE: 1 (ref 0.9–1.1)
PROTHROMBIN TIME CARE EVERYWHERE: 12.8 s (ref 12.1–13.9)

## 2019-01-05 LAB — CBC WITH AUTO DIFFERENTIAL
Basophils %: 0 % (ref 0–1.2)
Basophils Absolute: 0 10*3/uL (ref 0.0–0.1)
Eosinophils %: 2 %
Eosinophils Absolute: 0.1 10*3/uL (ref 0.0–0.5)
Granulocyte Absolute Count: 0 10*3/uL (ref 0.00–0.03)
Hematocrit: 42.5 % (ref 40.1–51.0)
Hemoglobin: 14.2 g/dL (ref 13.7–17.5)
Immature Granulocytes: 1 % — ABNORMAL HIGH (ref 0.0–0.4)
Lymphocytes %: 30 %
Lymphocytes Absolute: 1.7 10*3/uL (ref 1.2–3.7)
MCH: 29.7 PG (ref 25.6–32.2)
MCHC: 33.4 g/dL (ref 32.2–35.5)
MCV: 88.9 FL (ref 80.0–95.0)
MPV: 11.2 FL (ref 9.4–12.4)
Monocytes %: 6 %
Monocytes Absolute: 0.4 10*3/uL (ref 0.2–0.8)
Neutrophils %: 61 %
Neutrophils Absolute: 3.5 10*3/uL (ref 1.6–6.1)
Platelets: 223 10*3/uL (ref 150–400)
RBC: 4.78 M/uL (ref 4.51–5.93)
RDW: 13.9 % (ref 11.6–14.4)
WBC: 5.7 10*3/uL (ref 4.0–10.0)

## 2019-01-05 LAB — EKG 12-LEAD
Atrial Rate: 81 ms
ECG QTC Interval: 343 ms
EKG I-40 FRONT AXIS: -4 deg
EKG I-40 HORIZONTAL AXIS: 50 deg
EKG P DURATION: 119 ms
EKG P FRONT AXIS: 53 deg
EKG P HORIZONTAL AXIS: -13 deg
EKG Q ONSET: 504 ms
EKG QRS AXIS: 50 deg
EKG QRS HORIZONTAL AXIS: -54 deg
EKG QRSD INTERVAL: 96 ms
EKG QTCB: 400 ms
EKG QTCF: 380 ms
EKG RR INTERVAL: 736 ms
EKG S-T FRONT AXIS: 83 deg
EKG S-T HORIZONTAL AXIS: 65 deg
EKG T HORIZONTAL AXIS: 41 deg
EKG T WAVE AXIS: 64 deg
EKG T-40 FRONT AXIS: 105 deg
EKG T-40 HORIZONTAL AXIS: -67 deg
Heart Rate: 82 {beats}/min
P-R Interval: 154 ms

## 2019-01-05 LAB — MAGNESIUM
Magnesium: 1.7 mg/dL (ref 1.6–2.6)
Magnesium: 1.7 mg/dL (ref 1.6–2.6)

## 2019-01-05 LAB — TROPONIN
Troponin I: <0.015 ng/mL (ref 0.000–0.040)
Troponin I: 0.019 ng/mL (ref 0.000–0.040)

## 2019-01-05 LAB — COMPREHENSIVE METABOLIC PANEL
ALT: 163 U/L — ABNORMAL HIGH (ref 16–61)
AST: 104 U/L — ABNORMAL HIGH (ref 15–37)
Albumin/Globulin Ratio: 0.8 — ABNORMAL LOW (ref 1.0–3.0)
Albumin: 3.6 g/dL (ref 3.4–5.0)
Alkaline Phosphatase: 69 U/L (ref 45–117)
Anion Gap: 7 mmol/L (ref 5–15)
BUN: 16 MG/DL (ref 7–18)
Bun/Cre Ratio: 17 NA (ref 12–20)
CO2: 25 mmol/L (ref 21–32)
Calcium: 9.3 MG/DL (ref 8.5–10.1)
Chloride: 106 mmol/L (ref 98–110)
Creatinine: 0.95 MG/DL (ref 0.70–1.30)
EGFR IF NonAfrican American: >60 mL/min/{1.73_m2} (ref >60)
GFR African American: >60 mL/min/{1.73_m2} (ref >60)
Glucose: 337 mg/dL — ABNORMAL HIGH (ref 70–100)
Potassium: 4 mmol/L (ref 3.5–5.1)
Sodium: 138 mmol/L (ref 136–145)
Total Bilirubin: 0.39 mg/dL (ref 0.20–1.20)
Total Protein: 8 g/dL (ref 6.4–8.2)

## 2019-01-05 LAB — POCT GLUCOSE
POC Glucose: 348 mg/dL — ABNORMAL HIGH (ref 70–105)
POC Glucose: 392 mg/dL — ABNORMAL HIGH (ref 70–105)

## 2019-01-05 LAB — D-DIMER, QUANTITATIVE: D-Dimer, Quant: 0.39 ug/ml(FEU) (ref <0.46)

## 2019-01-05 LAB — NT-PRO BNP
BNP: 22 PG/ML
NT pro-BNP: 22 PG/ML

## 2019-01-05 LAB — PROTIME-INR
INR: 1 NA (ref 0.9–1.1)
Protime: 12.8 s (ref 12.1–13.9)

## 2019-01-05 LAB — LIPASE
Lipase: 112 U/L (ref 73–393)
Lipase: 112 U/L (ref 73–393)

## 2019-01-05 LAB — TROPONIN I
Troponin-I, Qt.: <0.015 ng/mL (ref 0.000–0.040)
Troponin-I, Qt.: 0.019 ng/mL (ref 0.000–0.040)

## 2019-01-05 LAB — EKG, 12 LEAD, INITIAL
Atrial Rate: 81 ms
Heart Rate: 82 {beats}/min
I-40 Front Axis: -4 deg
I-40 Horizontal Axis: 50 deg
P Duration: 119 ms
P Front Axis: 53 deg
P Horizontal Axis: -13 deg
P-R Interval: 154 ms
Q Onset: 504 ms
QRS Axis: 50 deg
QRS Horizontal Axis: -54 deg
QRSD Interval: 96 ms
QT Interval: 343 ms
QTcB: 400 ms
QTcF: 380 ms
RR Interval: 736 ms
S-T Front Axis: 83 deg
S-T Horizontal Axis: 65 deg
T Horizontal Axis: 41 deg
T Wave Axis: 64 deg
T-40 Front Axis: 105 deg
T-40 Horizontal Axis: -67 deg

## 2019-01-05 LAB — METABOLIC PANEL, COMPREHENSIVE
A-G Ratio: 0.8 — ABNORMAL LOW (ref 1.0–3.0)
ALT (SGPT): 163 U/L — ABNORMAL HIGH (ref 16–61)
AST (SGOT): 104 U/L — ABNORMAL HIGH (ref 15–37)
Albumin: 3.6 g/dL (ref 3.4–5.0)
Alk. phosphatase: 69 U/L (ref 45–117)
Anion gap: 7 mmol/L (ref 5–15)
BUN/Creatinine ratio: 17 (ref 12–20)
BUN: 16 MG/DL (ref 7–18)
Bilirubin, total: 0.39 mg/dL (ref 0.20–1.20)
CO2: 25 mmol/L (ref 21–32)
Calcium: 9.3 MG/DL (ref 8.5–10.1)
Chloride: 106 mmol/L (ref 98–110)
Creatinine: 0.95 MG/DL (ref 0.70–1.30)
GFR est AA: >60 mL/min/{1.73_m2} (ref >60)
GFR est non-AA: >60 mL/min/{1.73_m2} (ref >60)
Glucose: 337 mg/dL — ABNORMAL HIGH (ref 70–100)
Potassium: 4 mmol/L (ref 3.5–5.1)
Protein, total: 8 g/dL (ref 6.4–8.2)
Sodium: 138 mmol/L (ref 136–145)

## 2019-01-05 LAB — PROTHROMBIN TIME + INR
INR: 1 (ref 0.9–1.1)
Prothrombin time: 12.8 s (ref 12.1–13.9)

## 2019-01-05 LAB — CBC WITH AUTOMATED DIFF
ABS. BASOPHILS: 0 10*3/uL (ref 0.0–0.1)
ABS. EOSINOPHILS: 0.1 10*3/uL (ref 0.0–0.5)
ABS. IMM. GRANS.: 0 10*3/uL (ref 0.00–0.03)
ABS. LYMPHOCYTES: 1.7 10*3/uL (ref 1.2–3.7)
ABS. MONOCYTES: 0.4 10*3/uL (ref 0.2–0.8)
ABS. NEUTROPHILS: 3.5 10*3/uL (ref 1.6–6.1)
BASOPHILS: 0 % (ref 0–1.2)
EOSINOPHILS: 2 %
HCT: 42.5 % (ref 40.1–51.0)
HGB: 14.2 g/dL (ref 13.7–17.5)
IMMATURE GRANULOCYTES: 1 % — ABNORMAL HIGH (ref 0.0–0.4)
LYMPHOCYTES: 30 %
MCH: 29.7 PG (ref 25.6–32.2)
MCHC: 33.4 g/dL (ref 32.2–35.5)
MCV: 88.9 FL (ref 80.0–95.0)
MONOCYTES: 6 %
MPV: 11.2 FL (ref 9.4–12.4)
NEUTROPHILS: 61 %
PLATELET: 223 10*3/uL (ref 150–400)
RBC: 4.78 M/uL (ref 4.51–5.93)
RDW: 13.9 % (ref 11.6–14.4)
WBC: 5.7 10*3/uL (ref 4.0–10.0)

## 2019-01-05 LAB — D DIMER: D DIMER: 0.39 ug/ml(FEU) (ref <0.46)

## 2019-01-05 LAB — GLUCOSE, POC
Glucose (POC): 348 mg/dL — ABNORMAL HIGH (ref 70–105)
Glucose (POC): 392 mg/dL — ABNORMAL HIGH (ref 70–105)

## 2019-01-05 MED ORDER — INSULIN GLARGINE 100 UNIT/ML (3 ML) SUB-Q PEN
100 unit/mL (3 mL) | Freq: Every day | SUBCUTANEOUS | Status: DC
Start: 2019-01-05 — End: 2019-01-06
  Administered 2019-01-05: 22:00:00 via SUBCUTANEOUS

## 2019-01-05 MED ORDER — ACETAMINOPHEN (TYLENOL) SOLUTION 32MG/ML
ORAL | Status: DC | PRN
Start: 2019-01-05 — End: 2019-01-06

## 2019-01-05 MED ORDER — INSULIN ASPART 100 UNIT/ML (3 ML) SUB-Q PEN
100 unit/mL (3 mL) | Freq: Four times a day (QID) | SUBCUTANEOUS | Status: DC
Start: 2019-01-05 — End: 2019-01-05

## 2019-01-05 MED ORDER — HYDROMORPHONE 0.5 MG/0.5 ML SYRINGE
0.5 mg/ mL | Freq: Once | INTRAMUSCULAR | Status: AC
Start: 2019-01-05 — End: 2019-01-05
  Administered 2019-01-05: 22:00:00 via INTRAVENOUS

## 2019-01-05 MED ORDER — LIDOCAINE 5 % (700 MG/PATCH) ADHESIVE PATCH
5 % | Freq: Once | CUTANEOUS | Status: DC
Start: 2019-01-05 — End: 2019-01-05

## 2019-01-05 MED ORDER — ACETAMINOPHEN 500 MG TAB
500 mg | Freq: Once | ORAL | Status: AC
Start: 2019-01-05 — End: 2019-01-05
  Administered 2019-01-05: 19:00:00 via ORAL

## 2019-01-05 MED ORDER — CLOPIDOGREL 75 MG TAB
75 mg | ORAL | Status: AC
Start: 2019-01-05 — End: 2019-01-05
  Administered 2019-01-05: 19:00:00 via ORAL

## 2019-01-05 MED ORDER — ACETAMINOPHEN 650 MG RECTAL SUPPOSITORY
650 mg | RECTAL | Status: DC | PRN
Start: 2019-01-05 — End: 2019-01-06

## 2019-01-05 MED ORDER — LIDOCAINE 5 % (700 MG/PATCH) ADHESIVE PATCH
5 % | CUTANEOUS | Status: DC
Start: 2019-01-05 — End: 2019-01-05

## 2019-01-05 MED ORDER — FENTANYL CITRATE (PF) 50 MCG/ML IJ SOLN
50 mcg/mL | Freq: Once | INTRAMUSCULAR | Status: AC
Start: 2019-01-05 — End: 2019-01-05
  Administered 2019-01-05: 17:00:00 via INTRAVENOUS

## 2019-01-05 MED ORDER — ATORVASTATIN 40 MG TAB
40 mg | Freq: Every day | ORAL | Status: DC
Start: 2019-01-05 — End: 2019-01-06
  Administered 2019-01-06: 01:00:00 via ORAL

## 2019-01-05 MED ORDER — FENTANYL CITRATE (PF) 50 MCG/ML IJ SOLN
50 mcg/mL | Freq: Once | INTRAMUSCULAR | Status: AC
Start: 2019-01-05 — End: 2019-01-05
  Administered 2019-01-05: 19:00:00 via INTRAVENOUS

## 2019-01-05 MED ORDER — SODIUM CHLORIDE 0.9 % IJ SYRG
INTRAMUSCULAR | Status: DC | PRN
Start: 2019-01-05 — End: 2019-01-06

## 2019-01-05 MED ORDER — INSULIN ASPART 100 UNIT/ML (3 ML) SUB-Q PEN
100 unit/mL (3 mL) | Freq: Four times a day (QID) | SUBCUTANEOUS | Status: DC
Start: 2019-01-05 — End: 2019-01-06
  Administered 2019-01-05 – 2019-01-06 (×3): via SUBCUTANEOUS

## 2019-01-05 MED ORDER — ASPIRIN 81 MG TAB, DELAYED RELEASE
81 mg | Freq: Every day | ORAL | Status: DC
Start: 2019-01-05 — End: 2019-01-06
  Administered 2019-01-06: 12:00:00 via ORAL

## 2019-01-05 MED ORDER — ACETAMINOPHEN 325 MG TABLET
325 mg | ORAL | Status: DC | PRN
Start: 2019-01-05 — End: 2019-01-06

## 2019-01-05 MED ORDER — METOPROLOL TARTRATE 25 MG TAB
25 mg | Freq: Two times a day (BID) | ORAL | Status: DC
Start: 2019-01-05 — End: 2019-01-06
  Administered 2019-01-06 (×2): via ORAL

## 2019-01-05 MED ORDER — SODIUM CHLORIDE 0.9 % IJ SYRG
Freq: Two times a day (BID) | INTRAMUSCULAR | Status: DC
Start: 2019-01-05 — End: 2019-01-06
  Administered 2019-01-06 (×2): via INTRAVENOUS

## 2019-01-05 MED FILL — FENTANYL CITRATE (PF) 50 MCG/ML IJ SOLN: 50 mcg/mL | INTRAMUSCULAR | Qty: 2

## 2019-01-05 MED FILL — CLOPIDOGREL 75 MG TAB: 75 mg | ORAL | Qty: 1

## 2019-01-05 MED FILL — NORMAL SALINE FLUSH 0.9 % INJECTION SYRINGE: INTRAMUSCULAR | Qty: 10

## 2019-01-05 MED FILL — LIDOCAINE 5 % (700 MG/PATCH) ADHESIVE PATCH: 5 % | CUTANEOUS | Qty: 1

## 2019-01-05 MED FILL — HYDROMORPHONE 0.5 MG/0.5 ML SYRINGE: 0.5 mg/ mL | INTRAMUSCULAR | Qty: 1

## 2019-01-05 MED FILL — LANTUS SOLOSTAR U-100 INSULIN 100 UNIT/ML (3 ML) SUBCUTANEOUS PEN: 100 unit/mL (3 mL) | SUBCUTANEOUS | Qty: 3

## 2019-01-05 MED FILL — NOVOLOG FLEXPEN U-100 INSULIN ASPART 100 UNIT/ML (3 ML) SUBCUTANEOUS: 100 unit/mL (3 mL) | SUBCUTANEOUS | Qty: 3

## 2019-01-05 MED FILL — TYLENOL EXTRA STRENGTH 500 MG TABLET: 500 mg | ORAL | Qty: 2

## 2019-01-05 NOTE — ED Notes (Signed)
Xray at bedside.
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
## 2019-01-05 NOTE — ED Notes (Signed)
 TRANSFER - OUT REPORT:    Verbal report given to AMY(name) on Philmore L Haseley  being transferred to 3n(unit) for routine progression of care       Report consisted of patient's Situation, Background, Assessment and   Recommendations(SBAR).     Information from the following report(s) SBAR, ED Summary and MAR was reviewed with the receiving nurse.    Lines:   Peripheral IV 01/05/19 Right Arm (Active)   Site Assessment Clean, dry, & intact 01/05/19 1307   Phlebitis Assessment 0 01/05/19 1307   Infiltration Assessment 0 01/05/19 1307   Dressing Status Clean, dry, & intact 01/05/19 1307   Dressing Type Transparent 01/05/19 1307   Hub Color/Line Status Pink 01/05/19 1307   Alcohol Cap Used No 01/05/19 1307        Opportunity for questions and clarification was provided.      Patient transported with:   [employer]

## 2019-01-05 NOTE — ED Notes (Signed)
Patient reports left sided chest pain radiating down left arm and into left jaw that started 20 min ago while patient was sleeping. Patient also reports diaphoresis.

## 2019-01-05 NOTE — ED Notes (Signed)
Patient reports chest pain returned. Patient reports pain initially improved to 7/10 after first dose of pain med, now increased back up to 10. PA notified.

## 2019-01-05 NOTE — ED Notes (Signed)
ECHO at bedside.

## 2019-01-05 NOTE — H&P (Signed)
HISTORY AND PHYSICAL    Dustin Carney  1975/07/26  01/05/2019    Chief Complaint   Patient presents with   ??? Chest Pain         Subjective:     HPI    Dustin Carney is a 43 y.o.  male with anamoulous RCA s/p CABG x2 who presents to our facility with chest pain and discomfort at rest.  Patient was sleeping and he woke up with substernal chest pain which is described as tightness radiating down left arm and jaw associated with diaphoresis and mild nausea.  He has had similar incident this is in the past where he has required for which he underwent cardiac catheterization on 05/14 20. On cardiac catheterization he was found to have normal left main, lad/left circumflex with mild irregularities of we located native RCA.  He denies any fevers chills cough congestion or shortness of breath.  He denies any abdominal pain or discomfort.  He states his pain has persisted for 1 week.  And he was in [place] and recently moved here.  Although his recent record are obtained from [place].  He states he has an allergy to morphine, tramadol and NSAIDs although he is able to tolerate aspirin.  He says in the past Dilaudid has helped with his pain and discomfort.  In the ER fentanyl helped with his pain but pain relief was only for a few minutes.  He does continue to smoke approximately 3 cigarettes daily.  He also complains of left lower extremity pain and discomfort.  Remainder review of symptoms is negative      Past Medical History:   Diagnosis Date   ??? CAD (coronary artery disease)     MI x 3   ??? Cardiac revascularization with aortocoronary bypass anastomosis    ??? Chronic dental pain     - multiple teeth remove   ??? Chronic knee pain    ??? Hypertension    ??? Myocardial infarct [facility])    ??? S/P CABG x 1 2007    CABG x 1, 2007, due to congenital heart disease--patient's explanation is that right coronary artery was never in the proper location (perfused left side of heart only) and he had repeat cabage surgery in 2012 to  correct this.      Past Surgical History:   Procedure Laterality Date   ??? HX CHOLECYSTECTOMY  08/2010        ??? HX CORONARY ARTERY BYPASS GRAFT  2007, 2012    2007: due to congenital heart disease - patients explanation is that right coronary artery was never in the proper location (perfused left side of heart only) and had repeat CABG surgery in 2012 to correct this   ??? HX OTHER SURGICAL  11/30/2013    TOOTH EXTRACTION      Prior to Admission medications    Medication Sig Start Date End Date Taking? Authorizing Provider   atorvastatin (LIPITOR) 40 mg tablet Take 40 mg by mouth nightly. 10/22/18  Yes Provider, Historical   aspirin 81 mg chewable tablet Take 324 mg by mouth daily. Usually only takes 130m   Yes Provider, Historical   metoprolol succinate (TOPROL-XL) 50 mg XL tablet Take 50 mg by mouth daily.   Yes Provider, Historical   albuterol (PROAIR HFA) 90 mcg/actuation inhaler Take 1 Puff by inhalation every four (4) hours as needed for Wheezing. 01/02/17   BLovena Neighbours MD     Allergies   Allergen Reactions   ???  Coconut Anaphylaxis   ??? Codeine Hives   ??? Morphine Hives     At infusion site   ??? Motrin [Ibuprofen] Hives   ??? Toradol [Ketorolac] Hives   ??? Tramadol Hives      Social History     Tobacco Use   ??? Smoking status: Current Some Day Smoker     Packs/day: 0.25     Years: 32.00     Pack years: 8.00     Types: Cigarettes   ??? Smokeless tobacco: Former Systems developer   ??? Tobacco comment: About 40-50 pack years, used to smoke 2 PPD but now down to 2 cigarettes a day   Substance Use Topics   ??? Alcohol use: No     Family History   Problem Relation Age of Onset   ??? Heart Disease Mother    ??? Cancer Mother         BRAIN CANCER   ??? Diabetes Father    ??? Diabetes Brother    ??? Heart Disease Maternal Grandmother    ??? Dementia Maternal Grandmother    ??? Heart Disease Maternal Grandfather    ??? Heart Disease Paternal Grandmother    ??? Heart Disease Paternal Grandfather    ??? No Known Problems Other    ??? Heart Disease Brother             Review of Systems    Objective:     BP 122/73 (BP 1 Location: Right arm, BP Patient Position: Supine)    Pulse 70    Temp (!) 96.6 ??F (35.9 ??C)    Resp 16    Ht 6' 0.4" (1.839 m)    Wt 127 kg (280 lb)    SpO2 96%    BMI 37.56 kg/m??          Intake and Output:  No intake/output data recorded.  No intake/output data recorded.    Physical Exam  HENT:      Head: Normocephalic and atraumatic.   Cardiovascular:      Rate and Rhythm: Normal rate.      Heart sounds: No murmur.      Comments: Midline scar on chest wall  Pulmonary:      Effort: No respiratory distress.      Breath sounds: No wheezing or rales.   Abdominal:      General: There is no distension.      Tenderness: There is no abdominal tenderness. There is no guarding.   Musculoskeletal:         General: No swelling.      Comments: Positive calf tenderness   Skin:     Coloration: Skin is not jaundiced.   Neurological:      General: No focal deficit present.      Cranial Nerves: No cranial nerve deficit.      Motor: No weakness.      Gait: Gait normal.          ECG:  Sinus rhythm at a rate of 82 without any ST-T-wave changes.    Data Review:   Recent Results (from the past 24 hour(s))   EKG, 12 LEAD, INITIAL    Collection Time: 01/05/19 12:34 PM   Result Value Ref Range    Heart Rate 82 bpm    RR Interval 736 ms    Atrial Rate 81 ms    P-R Interval 154 ms    P Duration 119 ms    P Horizontal Axis -13 deg  P Front Axis 53 deg    Q Onset 504 ms    QRSD Interval 96 ms    QT Interval 343 ms    QTcB 400 ms    QTcF 380 ms    QRS Horizontal Axis -54 deg    QRS Axis 50 deg    I-40 Horizontal Axis 50 deg    I-40 Front Axis -4 deg    T-40 Horizontal Axis -67 deg    T-40 Front Axis 105 deg    T Horizontal Axis 41 deg    T Wave Axis 64 deg    S-T Horizontal Axis 65 deg    S-T Front Axis 83 deg   GLUCOSE, POC    Collection Time: 01/05/19 12:39 PM   Result Value Ref Range    Glucose (POC) 348 (H) 70 - 105 mg/dL    Performed by Noreene Larsson    CBC WITH AUTOMATED DIFF     Collection Time: 01/05/19  1:08 PM   Result Value Ref Range    WBC 5.7 4.0 - 10.0 K/uL    RBC 4.78 4.51 - 5.93 M/uL    HGB 14.2 13.7 - 17.5 g/dL    HCT 42.5 40.1 - 51.0 %    MCV 88.9 80.0 - 95.0 FL    MCH 29.7 25.6 - 32.2 PG    MCHC 33.4 32.2 - 35.5 g/dL    RDW 13.9 11.6 - 14.4 %    PLATELET 223 150 - 400 K/uL    MPV 11.2 9.4 - 12.4 FL    NEUTROPHILS 61 %    LYMPHOCYTES 30 %    MONOCYTES 6 %    EOSINOPHILS 2 %    BASOPHILS 0 0 - 1.2 %    ABS. NEUTROPHILS 3.5 1.6 - 6.1 K/UL    ABS. LYMPHOCYTES 1.7 1.2 - 3.7 K/UL    ABS. MONOCYTES 0.4 0.2 - 0.8 K/UL    ABS. EOSINOPHILS 0.1 0.0 - 0.5 K/UL    ABS. BASOPHILS 0.0 0.0 - 0.1 K/UL    DF AUTOMATED     IMMATURE GRANULOCYTES 1 (H) 0.0 - 0.4 %    ABS. IMM. GRANS. 0.0 0.00 - 0.03 K/UL   PROTHROMBIN TIME + INR    Collection Time: 01/05/19  1:08 PM   Result Value Ref Range    Prothrombin time 12.8 12.1 - 13.9 sec    INR 1.0 0.9 - 1.1   METABOLIC PANEL, COMPREHENSIVE    Collection Time: 01/05/19  1:08 PM   Result Value Ref Range    Sodium 138 136 - 145 mmol/L    Potassium 4.0 3.5 - 5.1 mmol/L    Chloride 106 98 - 110 mmol/L    CO2 25 21 - 32 mmol/L    Anion gap 7 5 - 15 mmol/L    Glucose 337 (H) 70 - 100 mg/dL    BUN 16 7 - 18 MG/DL    Creatinine 0.95 0.70 - 1.30 MG/DL    BUN/Creatinine ratio 17 12 - 20    GFR est AA >60 >60 ml/min/1.42m    GFR est non-AA >60 >60 ml/min/1.719m   Calcium 9.3 8.5 - 10.1 MG/DL    Bilirubin, total 0.39 0.20 - 1.20 mg/dL    ALT (SGPT) 163 (H) 16 - 61 U/L    AST (SGOT) 104 (H) 15 - 37 U/L    Alk. phosphatase 69 45 - 117 U/L    Protein, total 8.0 6.4 - 8.2 g/dL  Albumin 3.6 3.4 - 5.0 g/dL    A-G Ratio 0.8 (L) 1.0 - 3.0     LIPASE    Collection Time: 01/05/19  1:08 PM   Result Value Ref Range    Lipase 112 73 - 393 U/L   MAGNESIUM    Collection Time: 01/05/19  1:08 PM   Result Value Ref Range    Magnesium 1.7 1.6 - 2.6 mg/dL   TROPONIN I    Collection Time: 01/05/19  1:08 PM   Result Value Ref Range    Troponin-I, Qt. <0.015 0.000 - 0.040 ng/mL   NT-PRO  BNP    Collection Time: 01/05/19  1:08 PM   Result Value Ref Range    NT pro-BNP 22 PG/ML   D DIMER    Collection Time: 01/05/19  1:08 PM   Result Value Ref Range    D DIMER 0.39 <0.46 ug/ml(FEU)   TROPONIN I    Collection Time: 01/05/19  4:24 PM   Result Value Ref Range    Troponin-I, Qt. 0.019 0.000 - 0.040 ng/mL   GLUCOSE, POC    Collection Time: 01/05/19  5:15 PM   Result Value Ref Range    Glucose (POC) 392 (H) 70 - 105 mg/dL    Performed by Elba Barman         All Micro Results     None              Xr Chest Sngl V    Result Date: 01/05/2019  Chest, AP Upright Portable INDICATION: Chest pain. TECHNIQUE: AP upright portable chest was done and compared to study of 01/22/2010. FINDINGS: Lungs are clear of acute infiltrate.  Vascularity is normal.  Patient has had a previous median sternotomy.  Hila and mediastinum are normal.  Heart is not overall enlarged.  Costophrenic angles are clear.  There is a metallic-appearing device overlying mid chest, probably on the patient's skin surface.     : 1. NO ACUTE CARDIOPULMONARY FINDINGS ARE SEEN. 2. METALLIC DEVICE OVERLYING CHEST, PROBABLY ON THE SKIN SURFACE. 3. POSTSURGICAL CHANGES. J:  [id number] D:  01/05/2019 13:17:36 T:  01/05/2019 13:46:40   $@    Assessment/PLAN:     43 year old male with history of coronary artery disease status post coronary artery bypass grafting who recently underwent cardiac catheterization in Oct 01, 2018. Patient now presents with chest pain and discomfort.  He has no EKG changes to suggest ischemia.  He will be admitted for observation and undergo serial troponins and will be made NPO after midnight.  Patient tells me he is unable to take nitroglycerin for chest pain due to hypotension.  He states Dilaudid usually works well.  He will be given Dilaudid 1 mg x 1 and will be monitored on telemetry.  He will be maintained on aspirin, metoprolol, atorvastatin.      Signed By: Dominga Ferry, MD     January 05, 2019

## 2019-01-05 NOTE — ED Notes (Signed)
Patient appears asleep in stretcher, no distress noted.

## 2019-01-05 NOTE — ED Triage Notes (Signed)
Patient reports left sided chest pain radiating down left arm and into left jaw that started 20 min ago while patient was sleeping. Patient also reports diaphoresis.
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
## 2019-01-05 NOTE — ED Notes (Signed)
ECHO at bedside.

## 2019-01-05 NOTE — ED Notes (Signed)
Patient reports chest pain returned. Patient reports pain initially improved to 7/10 after first dose of pain med, now increased back up to 10. PA notified.

## 2019-01-05 NOTE — ED Notes (Signed)
TRANSFER - OUT REPORT:    Verbal report given to AMY(name) on Dustin Carney  being transferred to 3n(unit) for routine progression of care       Report consisted of patient???s Situation, Background, Assessment and   Recommendations(SBAR).     Information from the following report(s) SBAR, ED Summary and MAR was reviewed with the receiving nurse.    Lines:   Peripheral IV 01/05/19 Right Arm (Active)   Site Assessment Clean, dry, & intact 01/05/19 1307   Phlebitis Assessment 0 01/05/19 1307   Infiltration Assessment 0 01/05/19 1307   Dressing Status Clean, dry, & intact 01/05/19 1307   Dressing Type Transparent 01/05/19 1307   Hub Color/Line Status Pink 01/05/19 1307   Alcohol Cap Used No 01/05/19 1307        Opportunity for questions and clarification was provided.      Patient transported with:   Tech

## 2019-01-05 NOTE — H&P (Signed)
HISTORY AND PHYSICAL    Dustin Carney  Jan 07, 1976  01/05/2019    Chief Complaint   Patient presents with   ??? Chest Pain         Subjective:     HPI    Dustin Carney is a 43 y.o.  male with anamoulous RCA s/p CABG x2 who presents to our facility with chest pain and discomfort at rest.  Patient was sleeping and he woke up with substernal chest pain which is described as tightness radiating down left arm and jaw associated with diaphoresis and mild nausea.  He has had similar incident this is in the past where he has required for which he underwent cardiac catheterization on 05/14 20. On cardiac catheterization he was found to have normal left main, lad/left circumflex with mild irregularities of we located native RCA.  He denies any fevers chills cough congestion or shortness of breath.  He denies any abdominal pain or discomfort.  He states his pain has persisted for 1 week.  And he was in [place] and recently moved here.  Although his recent record are obtained from [place].  He states he has an allergy to morphine, tramadol and NSAIDs although he is able to tolerate aspirin.  He says in the past Dilaudid has helped with his pain and discomfort.  In the ER fentanyl helped with his pain but pain relief was only for a few minutes.  He does continue to smoke approximately 3 cigarettes daily.  He also complains of left lower extremity pain and discomfort.  Remainder review of symptoms is negative      Past Medical History:   Diagnosis Date   ??? CAD (coronary artery disease)     MI x 3   ??? Cardiac revascularization with aortocoronary bypass anastomosis    ??? Chronic dental pain     - multiple teeth remove   ??? Chronic knee pain    ??? Hypertension    ??? Myocardial infarct [facility])    ??? S/P CABG x 1 2007    CABG x 1, 2007, due to congenital heart disease--patient's explanation is that right coronary artery was never in the proper location (perfused left  side of heart only) and he had repeat cabage surgery in 2012 to correct this.      Past Surgical History:   Procedure Laterality Date   ??? HX CHOLECYSTECTOMY  08/2010        ??? HX CORONARY ARTERY BYPASS GRAFT  2007, 2012    2007: due to congenital heart disease - patients explanation is that right coronary artery was never in the proper location (perfused left side of heart only) and had repeat CABG surgery in 2012 to correct this   ??? HX OTHER SURGICAL  11/30/2013    TOOTH EXTRACTION      Prior to Admission medications    Medication Sig Start Date End Date Taking? Authorizing Provider   atorvastatin (LIPITOR) 40 mg tablet Take 40 mg by mouth nightly. 10/22/18  Yes Provider, Historical   aspirin 81 mg chewable tablet Take 324 mg by mouth daily. Usually only takes 171m   Yes Provider, Historical   metoprolol succinate (TOPROL-XL) 50 mg XL tablet Take 50 mg by mouth daily.   Yes Provider, Historical   albuterol (PROAIR HFA) 90 mcg/actuation inhaler Take 1 Puff by inhalation every four (4) hours as needed for Wheezing. 01/02/17   BLovena Neighbours MD     Allergies   Allergen Reactions   ???  Coconut Anaphylaxis   ??? Codeine Hives   ??? Morphine Hives     At infusion site   ??? Motrin [Ibuprofen] Hives   ??? Toradol [Ketorolac] Hives   ??? Tramadol Hives      Social History     Tobacco Use   ??? Smoking status: Current Some Day Smoker     Packs/day: 0.25     Years: 32.00     Pack years: 8.00     Types: Cigarettes   ??? Smokeless tobacco: Former Systems developer   ??? Tobacco comment: About 40-50 pack years, used to smoke 2 PPD but now down to 2 cigarettes a day   Substance Use Topics   ??? Alcohol use: No     Family History   Problem Relation Age of Onset   ??? Heart Disease Mother    ??? Cancer Mother         BRAIN CANCER   ??? Diabetes Father    ??? Diabetes Brother    ??? Heart Disease Maternal Grandmother    ??? Dementia Maternal Grandmother    ??? Heart Disease Maternal Grandfather    ??? Heart Disease Paternal Grandmother    ??? Heart Disease Paternal Grandfather     ??? No Known Problems Other    ??? Heart Disease Brother            Review of Systems    Objective:     BP 122/73 (BP 1 Location: Right arm, BP Patient Position: Supine)    Pulse 70    Temp (!) 96.6 ??F (35.9 ??C)    Resp 16    Ht 6' 0.4" (1.839 m)    Wt 127 kg (280 lb)    SpO2 96%    BMI 37.56 kg/m??          Intake and Output:  No intake/output data recorded.  No intake/output data recorded.    Physical Exam  HENT:      Head: Normocephalic and atraumatic.   Cardiovascular:      Rate and Rhythm: Normal rate.      Heart sounds: No murmur.      Comments: Midline scar on chest wall  Pulmonary:      Effort: No respiratory distress.      Breath sounds: No wheezing or rales.   Abdominal:      General: There is no distension.      Tenderness: There is no abdominal tenderness. There is no guarding.   Musculoskeletal:         General: No swelling.      Comments: Positive calf tenderness   Skin:     Coloration: Skin is not jaundiced.   Neurological:      General: No focal deficit present.      Cranial Nerves: No cranial nerve deficit.      Motor: No weakness.      Gait: Gait normal.          ECG:  Sinus rhythm at a rate of 82 without any ST-T-wave changes.    Data Review:   Recent Results (from the past 24 hour(s))   EKG, 12 LEAD, INITIAL    Collection Time: 01/05/19 12:34 PM   Result Value Ref Range    Heart Rate 82 bpm    RR Interval 736 ms    Atrial Rate 81 ms    P-R Interval 154 ms    P Duration 119 ms    P Horizontal Axis -13 deg  P Front Axis 53 deg    Q Onset 504 ms    QRSD Interval 96 ms    QT Interval 343 ms    QTcB 400 ms    QTcF 380 ms    QRS Horizontal Axis -54 deg    QRS Axis 50 deg    I-40 Horizontal Axis 50 deg    I-40 Front Axis -4 deg    T-40 Horizontal Axis -67 deg    T-40 Front Axis 105 deg    T Horizontal Axis 41 deg    T Wave Axis 64 deg    S-T Horizontal Axis 65 deg    S-T Front Axis 83 deg   GLUCOSE, POC    Collection Time: 01/05/19 12:39 PM   Result Value Ref Range    Glucose (POC) 348 (H) 70 - 105 mg/dL     Performed by Noreene Larsson    CBC WITH AUTOMATED DIFF    Collection Time: 01/05/19  1:08 PM   Result Value Ref Range    WBC 5.7 4.0 - 10.0 K/uL    RBC 4.78 4.51 - 5.93 M/uL    HGB 14.2 13.7 - 17.5 g/dL    HCT 42.5 40.1 - 51.0 %    MCV 88.9 80.0 - 95.0 FL    MCH 29.7 25.6 - 32.2 PG    MCHC 33.4 32.2 - 35.5 g/dL    RDW 13.9 11.6 - 14.4 %    PLATELET 223 150 - 400 K/uL    MPV 11.2 9.4 - 12.4 FL    NEUTROPHILS 61 %    LYMPHOCYTES 30 %    MONOCYTES 6 %    EOSINOPHILS 2 %    BASOPHILS 0 0 - 1.2 %    ABS. NEUTROPHILS 3.5 1.6 - 6.1 K/UL    ABS. LYMPHOCYTES 1.7 1.2 - 3.7 K/UL    ABS. MONOCYTES 0.4 0.2 - 0.8 K/UL    ABS. EOSINOPHILS 0.1 0.0 - 0.5 K/UL    ABS. BASOPHILS 0.0 0.0 - 0.1 K/UL    DF AUTOMATED     IMMATURE GRANULOCYTES 1 (H) 0.0 - 0.4 %    ABS. IMM. GRANS. 0.0 0.00 - 0.03 K/UL   PROTHROMBIN TIME + INR    Collection Time: 01/05/19  1:08 PM   Result Value Ref Range    Prothrombin time 12.8 12.1 - 13.9 sec    INR 1.0 0.9 - 1.1   METABOLIC PANEL, COMPREHENSIVE    Collection Time: 01/05/19  1:08 PM   Result Value Ref Range    Sodium 138 136 - 145 mmol/L    Potassium 4.0 3.5 - 5.1 mmol/L    Chloride 106 98 - 110 mmol/L    CO2 25 21 - 32 mmol/L    Anion gap 7 5 - 15 mmol/L    Glucose 337 (H) 70 - 100 mg/dL    BUN 16 7 - 18 MG/DL    Creatinine 0.95 0.70 - 1.30 MG/DL    BUN/Creatinine ratio 17 12 - 20    GFR est AA >60 >60 ml/min/1.35m    GFR est non-AA >60 >60 ml/min/1.713m   Calcium 9.3 8.5 - 10.1 MG/DL    Bilirubin, total 0.39 0.20 - 1.20 mg/dL    ALT (SGPT) 163 (H) 16 - 61 U/L    AST (SGOT) 104 (H) 15 - 37 U/L    Alk. phosphatase 69 45 - 117 U/L    Protein, total 8.0 6.4 - 8.2 g/dL  Albumin 3.6 3.4 - 5.0 g/dL    A-G Ratio 0.8 (L) 1.0 - 3.0     LIPASE    Collection Time: 01/05/19  1:08 PM   Result Value Ref Range    Lipase 112 73 - 393 U/L   MAGNESIUM    Collection Time: 01/05/19  1:08 PM   Result Value Ref Range    Magnesium 1.7 1.6 - 2.6 mg/dL   TROPONIN I    Collection Time: 01/05/19  1:08 PM    Result Value Ref Range    Troponin-I, Qt. <0.015 0.000 - 0.040 ng/mL   NT-PRO BNP    Collection Time: 01/05/19  1:08 PM   Result Value Ref Range    NT pro-BNP 22 PG/ML   D DIMER    Collection Time: 01/05/19  1:08 PM   Result Value Ref Range    D DIMER 0.39 <0.46 ug/ml(FEU)   TROPONIN I    Collection Time: 01/05/19  4:24 PM   Result Value Ref Range    Troponin-I, Qt. 0.019 0.000 - 0.040 ng/mL   GLUCOSE, POC    Collection Time: 01/05/19  5:15 PM   Result Value Ref Range    Glucose (POC) 392 (H) 70 - 105 mg/dL    Performed by Elba Barman         All Micro Results     None              Xr Chest Sngl V    Result Date: 01/05/2019  Chest, AP Upright Portable INDICATION: Chest pain. TECHNIQUE: AP upright portable chest was done and compared to study of 01/22/2010. FINDINGS: Lungs are clear of acute infiltrate.  Vascularity is normal.  Patient has had a previous median sternotomy.  Hila and mediastinum are normal.  Heart is not overall enlarged.  Costophrenic angles are clear.  There is a metallic-appearing device overlying mid chest, probably on the patient's skin surface.     : 1. NO ACUTE CARDIOPULMONARY FINDINGS ARE SEEN. 2. METALLIC DEVICE OVERLYING CHEST, PROBABLY ON THE SKIN SURFACE. 3. POSTSURGICAL CHANGES. J:  [id number] D:  01/05/2019 13:17:36 T:  01/05/2019 13:46:40   $@    Assessment/PLAN:     43 year old male with history of coronary artery disease status post coronary artery bypass grafting who recently underwent cardiac catheterization in Oct 01, 2018. Patient now presents with chest pain and discomfort.  He has no EKG changes to suggest ischemia.  He will be admitted for observation and undergo serial troponins and will be made NPO after midnight.  Patient tells me he is unable to take nitroglycerin for chest pain due to hypotension.  He states Dilaudid usually works well.  He will be given Dilaudid 1 mg x 1 and will be monitored on telemetry.  He will be maintained on aspirin, metoprolol, atorvastatin.       Signed By: Dominga Ferry, MD     January 05, 2019

## 2019-01-06 LAB — HEMOGLOBIN A1C CARE EVERYWHERE
ESTIMATED AVERAGE GLUCOSE CARE EVERYWHERE: 272 mg/dL
HEMOGLOBIN A1C CARE EVERYWHERE: 11.1 % — ABNORMAL HIGH (ref 4.2–6.3)

## 2019-01-06 LAB — EKG 12-LEAD
Atrial Rate: 59 ms
ECG QTC Interval: 384 ms
EKG I-40 FRONT AXIS: 22 deg
EKG I-40 HORIZONTAL AXIS: 63 deg
EKG P DURATION: 131 ms
EKG P FRONT AXIS: 28 deg
EKG P HORIZONTAL AXIS: -9 deg
EKG Q ONSET: 499 ms
EKG QRS AXIS: 46 deg
EKG QRS HORIZONTAL AXIS: -37 deg
EKG QRSD INTERVAL: 96 ms
EKG QTCB: 381 ms
EKG QTCF: 382 ms
EKG RR INTERVAL: 1017 ms
EKG S-T FRONT AXIS: 54 deg
EKG S-T HORIZONTAL AXIS: 53 deg
EKG T HORIZONTAL AXIS: 33 deg
EKG T WAVE AXIS: 58 deg
EKG T-40 FRONT AXIS: 81 deg
EKG T-40 HORIZONTAL AXIS: -56 deg
Heart Rate: 59 {beats}/min
P-R Interval: 197 ms

## 2019-01-06 LAB — POCT GLUCOSE
POC Glucose: 394 mg/dL — ABNORMAL HIGH (ref 70–105)
POC Glucose: 373 mg/dL — ABNORMAL HIGH (ref 70–105)

## 2019-01-06 LAB — HEMOGLOBIN A1C W/EAG
Hemoglobin A1C: 11.1 % — ABNORMAL HIGH (ref 4.2–6.3)
eAG: 272 mg/dL

## 2019-01-06 LAB — TROPONIN
Troponin I: <0.015 ng/mL (ref 0.000–0.040)
Troponin I: <0.015 ng/mL (ref 0.000–0.040)

## 2019-01-06 LAB — EKG, 12 LEAD, INITIAL
Atrial Rate: 59 ms
Heart Rate: 59 {beats}/min
I-40 Front Axis: 22 deg
I-40 Horizontal Axis: 63 deg
P Duration: 131 ms
P Front Axis: 28 deg
P Horizontal Axis: -9 deg
P-R Interval: 197 ms
Q Onset: 499 ms
QRS Axis: 46 deg
QRS Horizontal Axis: -37 deg
QRSD Interval: 96 ms
QT Interval: 384 ms
QTcB: 381 ms
QTcF: 382 ms
RR Interval: 1017 ms
S-T Front Axis: 54 deg
S-T Horizontal Axis: 53 deg
T Horizontal Axis: 33 deg
T Wave Axis: 58 deg
T-40 Front Axis: 81 deg
T-40 Horizontal Axis: -56 deg

## 2019-01-06 LAB — HEMOGLOBIN A1C WITH EAG
Est. average glucose: 272 mg/dL
Hemoglobin A1c: 11.1 % — ABNORMAL HIGH (ref 4.2–6.3)

## 2019-01-06 LAB — GLUCOSE, POC
Glucose (POC): 394 mg/dL — ABNORMAL HIGH (ref 70–105)
Glucose (POC): 373 mg/dL — ABNORMAL HIGH (ref 70–105)

## 2019-01-06 LAB — TROPONIN I
Troponin-I, Qt.: <0.015 ng/mL (ref 0.000–0.040)
Troponin-I, Qt.: <0.015 ng/mL (ref 0.000–0.040)

## 2019-01-06 MED ORDER — HYDROMORPHONE 0.5 MG/0.5 ML SYRINGE
0.5 mg/ mL | INTRAMUSCULAR | Status: DC | PRN
Start: 2019-01-06 — End: 2019-01-05

## 2019-01-06 MED ORDER — ACETAMINOPHEN 500 MG TAB
500 mg | Freq: Three times a day (TID) | ORAL | Status: DC | PRN
Start: 2019-01-06 — End: 2019-01-06

## 2019-01-06 MED ORDER — LIDOCAINE 5 % (700 MG/PATCH) ADHESIVE PATCH
5 % | CUTANEOUS | Status: DC
Start: 2019-01-06 — End: 2019-01-06

## 2019-01-06 MED ORDER — HYDROMORPHONE 0.5 MG/0.5 ML SYRINGE
0.5 mg/ mL | INTRAMUSCULAR | Status: AC | PRN
Start: 2019-01-06 — End: 2019-01-06
  Administered 2019-01-06 (×2): via INTRAVENOUS

## 2019-01-06 MED ORDER — INSULIN ASPART 100 UNIT/ML (3 ML) SUB-Q PEN
100 unit/mL (3 mL) | Freq: Four times a day (QID) | SUBCUTANEOUS | Status: DC
Start: 2019-01-06 — End: 2019-01-06
  Administered 2019-01-06 (×2): via SUBCUTANEOUS

## 2019-01-06 MED ORDER — INSULIN ASPART 100 UNIT/ML (3 ML) SUB-Q PEN
100 unit/mL (3 mL) | Freq: Once | SUBCUTANEOUS | Status: AC
Start: 2019-01-06 — End: 2019-01-06
  Administered 2019-01-06: 13:00:00 via SUBCUTANEOUS

## 2019-01-06 MED FILL — ATORVASTATIN 40 MG TAB: 40 mg | ORAL | Qty: 2

## 2019-01-06 MED FILL — HYDROMORPHONE 0.5 MG/0.5 ML SYRINGE: 0.5 mg/ mL | INTRAMUSCULAR | Qty: 1

## 2019-01-06 MED FILL — NOVOLOG FLEXPEN U-100 INSULIN ASPART 100 UNIT/ML (3 ML) SUBCUTANEOUS: 100 unit/mL (3 mL) | SUBCUTANEOUS | Qty: 3

## 2019-01-06 MED FILL — METOPROLOL TARTRATE 25 MG TAB: 25 mg | ORAL | Qty: 1

## 2019-01-06 MED FILL — ACETAMINOPHEN 325 MG TABLET: 325 mg | ORAL | Qty: 2

## 2019-01-06 MED FILL — ASPIRIN 81 MG TAB, DELAYED RELEASE: 81 mg | ORAL | Qty: 1
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
## 2019-01-06 NOTE — Consults (Signed)
[facility] - Consult Note    Patient: Dustin Carney MRN: [id number]  SSN: [id number]    Date of Birth: Jun 16, 1975  Age: 43 y.o.  Sex: male      Consultation request by Dominga Ferry, MD.  Admission Diagnosis:   Chest pain [R07.9]  Date of  Admission: 01/05/2019 12:27 PM   Primary Care Physician:  Rance Muir, DO  Cardiologist: Olean Ree, MD     Reason for Consultation     Chest pain     Cardiac Summary:     1. Cardiac     A. Congenital coronary anomaly         I. CABG 2007 [facility]         II. Re-do CABG 2012 [place], followed by Dr. Myles Rosenthal in [place]         III. Cath 10/02/18: RCA MLI, other coronaries normal         IV. Echo 01/05/19: EF 60%, probably tricuspid AV, mild MR    2. Medical  Obesity  Hypertension  Hyperlipidemia  Chronic knee pain  Chronic dental pain    3. Surgical  Cholecystectomy  Tooth extractions    4. 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    5. Family history:  Father is alive in his 16s, he suffered a heart attack at age 58 mother died at 72 of brain cancer in 01-13-2019.     6. Medication intolerances:  Nitroglycerin caused pathologic drop in blood pressure.  Allergic to morphine, tramadol, NSAIDs but able to tolerate baby aspirin.     History of Present Illness:     I was asked to see this patient in consultation by Dr. Ann Held for evaluation of chest pain.  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  He underwent open-heart surgery in 2007 with a redo in 2012, I do not have details.  He has been followed by a cardiologist in [place].  He says that he has been doing well until earlier this year.  He presented in May with chest pain, cardiac catheterization revealed minor luminal irregularities in the RCA per report, other  coronaries reported as normal.    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  He says that he performs routine activities including walking extended distances in climbing stairs without difficulty, no adverse cardiorespiratory symptoms associated with exertion.  Unfortunately he reports that his mother died 2 days ago brain cancer, there has been a lot of emotional stress.  He awoke from sleep with chest tightness radiating to the left shoulder and arm.  The symptom reminded him of what he felt prior to his recent catheterization.  He presented to the ED and was admitted to the hospitalist service.  Vital signs have been stable.  Troponins x4 have been negative.  He is currently pain-free.  If not discharge probably he is planning to leave AMA to smoke a cigarette.    EKG demonstrated normal sinus rhythm 82 beats per minute, left atrial enlargement, poor R-wave progression, no acute ischemic changes.    Chest x-ray unremarkable.    Labs notable for elevated glucose 337. Elevated transaminases with AST 104, ALT 163. Troponins negative x4.    Review of Symptoms:  Except as  stated above include:  Constitutional:  negative  Respiratory:  negative  Cardiovascular:  negative  Gastrointestinal: negative  Genitourinary:  negative  Musculoskeletal:  Negative  Neurological:  Negative  Dermatological:  Negative  Endocrinological: Negative  Psychological:  Negative  A comprehensive review of systems was negative except for that written in the HPI.     Past Medical History:     Past Medical History:   Diagnosis Date   ??? CAD (coronary artery disease)     MI x 3   ??? Cardiac revascularization with aortocoronary bypass anastomosis    ??? Chronic dental pain     - multiple teeth remove   ??? Chronic knee pain    ??? Hypertension    ??? Myocardial infarct [facility](HCC)    ??? S/P CABG x 1 2007    CABG x 1, 2007, due to congenital heart disease--patient's explanation is that right coronary artery was never in the proper location (perfused left side of  heart only) and he had repeat cabage surgery in 2012 to correct this.   ??? Thromboembolus [facility](HCC)          Past Surgical History:     Past Surgical History:   Procedure Laterality Date   ??? HX CHOLECYSTECTOMY  08/2010        ??? HX CORONARY ARTERY BYPASS GRAFT  2007, 2012    2007: due to congenital heart disease - patients explanation is that right coronary artery was never in the proper location (perfused left side of heart only) and had repeat CABG surgery in 2012 to correct this   ??? HX OTHER SURGICAL  11/30/2013    TOOTH EXTRACTION       Medications:     Current Facility-Administered Medications   Medication Dose Route Frequency   ??? insulin aspart U-100 (NOVOLOG) pen   SubCUTAneous AC&HS   ??? insulin aspart U-100 (NOVOLOG) pen 10 Units  10 Units SubCUTAneous ONCE   ??? lidocaine (LIDODERM) 5 % patch 1 Patch  1 Patch TransDERmal Q24H   ??? acetaminophen (TYLENOL) tablet 1,000 mg  1,000 mg Oral Q8H PRN   ??? sodium chloride (NS) flush 3-10 mL  3-10 mL IntraVENous Q12H   ??? sodium chloride (NS) flush 3-10 mL  3-10 mL IntraVENous PRN   ??? insulin glargine (LANTUS,BASAGLAR) pen 10 Units  10 Units SubCUTAneous DAILY   ??? aspirin delayed-release tablet 81 mg  81 mg Oral DAILY   ??? atorvastatin (LIPITOR) tablet 80 mg  80 mg Oral DAILY   ??? metoprolol tartrate (LOPRESSOR) tablet 25 mg  25 mg Oral BID     Current Outpatient Medications   Medication Sig   ??? atorvastatin (LIPITOR) 40 mg tablet Take 40 mg by mouth nightly.   ??? aspirin 81 mg chewable tablet Take 324 mg by mouth daily. Usually only takes 162mg    ??? metoprolol succinate (TOPROL-XL) 50 mg XL tablet Take 50 mg by mouth daily.   ??? albuterol (PROAIR HFA) 90 mcg/actuation inhaler Take 1 Puff by inhalation every four (4) hours as needed for Wheezing.        Allergies:     Allergies   Allergen Reactions   ??? Coconut Anaphylaxis   ??? Codeine Hives   ??? Morphine Hives     At infusion site   ??? Motrin [Ibuprofen] Hives   ??? Toradol [Ketorolac] Hives   ??? Tramadol Hives         Physical Exam:      Visit Vitals  BP 132/71 (  BP 1 Location: Right arm, BP Patient Position: Standing)   Pulse 66   Temp (!) 96.5 ??F (35.8 ??C)   Resp 20   Ht 6' 0.4" (1.839 m)   Wt 127 kg (280 lb)   SpO2 95%   BMI 37.56 kg/m??     BP Readings from Last 3 Encounters:   01/06/19 132/71   09/03/18 115/73   08/19/18 (!) 156/94     Pulse Readings from Last 3 Encounters:   01/06/19 66   09/03/18 84   08/19/18 (!) 102     Wt Readings from Last 3 Encounters:   01/05/19 127 kg (280 lb)   09/03/18 127 kg (280 lb)   01/30/18 120.2 kg (265 lb)       General:  alert, cooperative, no distress, appears stated age  Eyes: EOMI  HEENT:  NCAT, MMM  Neck: no carotid bruit, no JVD  Heme/Lymphatic/Immune:  No lymphadenopathy  Lungs:  clear to auscultation bilaterally  Heart:  regular rate and rhythm, S1, S2 normal, no murmur, click, rub or gallop  Abdomen/GI:  abdomen is soft without significant tenderness, or guarding  Genitourinary:  No dysuria  Extremities:  extremities atraumatic, no cyanosis or edema  Skin: Warm and dry. no hyperpigmentation or gross llesions  Neuro: no gross neurological deficits  Psych: appropriate mood and affect     Data Review:     Recent Labs     01/05/19  1308   WBC 5.7   HGB 14.2   HCT 42.5   PLT 223     Recent Labs     01/05/19  1308   NA 138   K 4.0   CL 106   CO2 25   GLU 337*   BUN 16   CREA 0.95   CA 9.3   MG 1.7   ALB 3.6   ALT 163*   INR 1.0       Results for orders placed or performed during the hospital encounter of 01/05/19   EKG, 12 LEAD, INITIAL   Result Value Ref Range    Heart Rate 59 bpm    RR Interval 1,017 ms    Atrial Rate 59 ms    P-R Interval 197 ms    P Duration 131 ms    P Horizontal Axis -9 deg    P Front Axis 28 deg    Q Onset 499 ms    QRSD Interval 96 ms    QT Interval 384 ms    QTcB 381 ms    QTcF 382 ms    QRS Horizontal Axis -37 deg    QRS Axis 46 deg    I-40 Horizontal Axis 63 deg    I-40 Front Axis 22 deg    T-40 Horizontal Axis -56 deg    T-40 Front Axis 81 deg    T Horizontal Axis 33 deg    T  Wave Axis 58 deg    S-T Horizontal Axis 53 deg    S-T Front Axis 54 deg       All Cardiac Markers in the last 24 hours:    Lab Results   Component Value Date/Time    TROIQ <0.015 01/06/2019 03:16 AM    TROIQ <0.015 01/05/2019 08:24 PM    TROIQ 0.019 01/05/2019 04:24 PM    TROIQ <0.015 01/05/2019 01:08 PM       Last Lipid:  No results found for: CHOL, CHOLX, CHLST, CHOLV, HDL, HDLP, LDL, LDLC, DLDLP, TGLX, TRIGL,  TRIGP, CHHD, CHHDX     Assessment/Plan:     This is a 43 year old gentleman with a 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  He has not had anginal pain with exertion, current pain occurs in the setting of the recent death of his mother.  He is now chest pain free.  His vitals are stable and has been ruled out for myocardial infarction.  Echocardiogram shows normal ejection fraction of 60%, mild mitral regurgitation.    Plan  He is okay for discharge home today on his current medications including daily baby aspirin, atorvastatin, metoprolol  We will obtain records of [facility], also most recent cardiology records from Dr. Vear ClockPhillips in [place]  [place] and like to [facility] cardiovascular care with the [facility].  He will follow-up with me in 6 months, sooner if needed.      Thank you for the opportunity to participate in his care.      Signed By: [place] RidingSamuel W Desa Rech, MD     January 06, 2019

## 2019-01-06 NOTE — Progress Notes (Signed)
Pt was adamant to leave ama. Pt educated by md to stay to get the care he needs. Pt was upset with night staff RN. Pt wanted iv pain med and Rn would only give him tylenol. Pt to follow up with dr Cherrie Distance cardiology as outpatient. Pt got md card and was educated to have him call for follow up. Pt signed ama form. Iv was dcd no bleeding or hematoma noted. Pt was walked to the lobby for dc. Patient had call the floor to try to get a taxi voucher to go to [place]. We told the patient that was too far and that he needs to call someone for a ride. Pt was also educated to get a pcp for f/u care. Pt is discharged.

## 2019-01-06 NOTE — Progress Notes (Signed)
Problem: Falls - Risk of  Goal: *Absence of Falls  Description: Document Schmid Fall Risk and appropriate interventions in the flowsheet.  Outcome: Progressing Towards Goal  Note: Fall Risk Interventions:                                Problem: Patient Education: Go to Patient Education Activity  Goal: Patient/Family Education  Outcome: Progressing Towards Goal

## 2019-01-06 NOTE — Consults (Signed)
[facility] - Consult Note    Patient: Dustin Carney MRN: [id number]  SSN: [id number]    Date of Birth: 11-Oct-1975  Age: 43 y.o.  Sex: male      Consultation request by Roma SchanzAmit Chander, MD.  Admission Diagnosis:   Chest pain [R07.9]  Date of  Admission: 01/05/2019 12:27 PM   Primary Care Physician:  Wilson SingerAssadollahzadeh, Mina, DO  Cardiologist: [place] RidingSamuel W Yahira Timberman, MD     Reason for Consultation     Chest pain     Cardiac Summary:     1. Cardiac     A. Congenital coronary anomaly         I. CABG 2007 [facility]         II. Re-do CABG 2012 [facility] [facility], followed by Dr. Venia CarbonPhilips in [place]         III. Cath 10/02/18: RCA MLI, other coronaries normal         IV. Echo 01/05/19: EF 60%, probably tricuspid AV, mild MR    2. Medical  Obesity  Hypertension  Hyperlipidemia  Chronic knee pain  Chronic dental pain    3. Surgical  Cholecystectomy  Tooth extractions    4. 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    5. Family history:  Father is alive in his 5560s, he suffered a heart attack at age 41 mother died at 6658 of brain cancer in August 2020.     6. Medication intolerances:  Nitroglycerin caused pathologic drop in blood pressure.  Allergic to morphine, tramadol, NSAIDs but able to tolerate baby aspirin.     History of Present Illness:     I was asked to see this patient in consultation by Dr. Bradd Burnerhander for evaluation of chest pain.  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  He underwent open-heart surgery in 2007 with a redo in 2012, I do not have details.  He has been followed by a cardiologist in [place].  He says that he has been doing well until earlier this year.  He presented in May with chest pain, cardiac catheterization revealed minor luminal irregularities in the RCA per  report, other coronaries reported as normal.    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  He says that he performs routine activities including walking extended distances in climbing stairs without difficulty, no adverse cardiorespiratory symptoms associated with exertion.  Unfortunately he reports that his mother died 2 days ago brain cancer, there has been a lot of emotional stress.  He awoke from sleep with chest tightness radiating to the left shoulder and arm.  The symptom reminded him of what he felt prior to his recent catheterization.  He presented to the ED and was admitted to the hospitalist service.  Vital signs have been stable.  Troponins x4 have been negative.  He is currently pain-free.  If not discharge probably he is planning to leave AMA to smoke a cigarette.    EKG demonstrated normal sinus rhythm 82 beats per minute, left atrial enlargement, poor R-wave progression, no acute ischemic changes.    Chest x-ray unremarkable.    Labs notable for elevated glucose 337. Elevated transaminases with AST 104, ALT 163. Troponins negative x4.    Review of Symptoms:  Except as  stated above include:  Constitutional:  negative  Respiratory:  negative  Cardiovascular:  negative  Gastrointestinal: negative  Genitourinary:  negative  Musculoskeletal:  Negative  Neurological:  Negative  Dermatological:  Negative  Endocrinological: Negative  Psychological:  Negative  A comprehensive review of systems was negative except for that written in the HPI.     Past Medical History:     Past Medical History:   Diagnosis Date   ??? CAD (coronary artery disease)     MI x 3   ??? Cardiac revascularization with aortocoronary bypass anastomosis    ??? Chronic dental pain     - multiple teeth remove   ??? Chronic knee pain    ??? Hypertension    ??? Myocardial infarct [facility](HCC)    ??? S/P CABG x 1 2007    CABG x 1, 2007, due to congenital heart disease--patient's explanation is  that right coronary artery was never in the proper location (perfused left side of heart only) and he had repeat cabage surgery in 2012 to correct this.   ??? Thromboembolus [facility](HCC)          Past Surgical History:     Past Surgical History:   Procedure Laterality Date   ??? HX CHOLECYSTECTOMY  08/2010        ??? HX CORONARY ARTERY BYPASS GRAFT  2007, 2012    2007: due to congenital heart disease - patients explanation is that right coronary artery was never in the proper location (perfused left side of heart only) and had repeat CABG surgery in 2012 to correct this   ??? HX OTHER SURGICAL  11/30/2013    TOOTH EXTRACTION       Medications:     Current Facility-Administered Medications   Medication Dose Route Frequency   ??? insulin aspart U-100 (NOVOLOG) pen   SubCUTAneous AC&HS   ??? insulin aspart U-100 (NOVOLOG) pen 10 Units  10 Units SubCUTAneous ONCE   ??? lidocaine (LIDODERM) 5 % patch 1 Patch  1 Patch TransDERmal Q24H   ??? acetaminophen (TYLENOL) tablet 1,000 mg  1,000 mg Oral Q8H PRN   ??? sodium chloride (NS) flush 3-10 mL  3-10 mL IntraVENous Q12H   ??? sodium chloride (NS) flush 3-10 mL  3-10 mL IntraVENous PRN   ??? insulin glargine (LANTUS,BASAGLAR) pen 10 Units  10 Units SubCUTAneous DAILY   ??? aspirin delayed-release tablet 81 mg  81 mg Oral DAILY   ??? atorvastatin (LIPITOR) tablet 80 mg  80 mg Oral DAILY   ??? metoprolol tartrate (LOPRESSOR) tablet 25 mg  25 mg Oral BID     Current Outpatient Medications   Medication Sig   ??? atorvastatin (LIPITOR) 40 mg tablet Take 40 mg by mouth nightly.   ??? aspirin 81 mg chewable tablet Take 324 mg by mouth daily. Usually only takes 162mg    ??? metoprolol succinate (TOPROL-XL) 50 mg XL tablet Take 50 mg by mouth daily.   ??? albuterol (PROAIR HFA) 90 mcg/actuation inhaler Take 1 Puff by inhalation every four (4) hours as needed for Wheezing.        Allergies:     Allergies   Allergen Reactions   ??? Coconut Anaphylaxis   ??? Codeine Hives   ??? Morphine Hives     At infusion site    ??? Motrin [Ibuprofen] Hives   ??? Toradol [Ketorolac] Hives   ??? Tramadol Hives         Physical Exam:     Visit Vitals  BP 132/71 (  BP 1 Location: Right arm, BP Patient Position: Standing)   Pulse 66   Temp (!) 96.5 ??F (35.8 ??C)   Resp 20   Ht 6' 0.4" (1.839 m)   Wt 127 kg (280 lb)   SpO2 95%   BMI 37.56 kg/m??     BP Readings from Last 3 Encounters:   01/06/19 132/71   09/03/18 115/73   08/19/18 (!) 156/94     Pulse Readings from Last 3 Encounters:   01/06/19 66   09/03/18 84   08/19/18 (!) 102     Wt Readings from Last 3 Encounters:   01/05/19 127 kg (280 lb)   09/03/18 127 kg (280 lb)   01/30/18 120.2 kg (265 lb)       General:  alert, cooperative, no distress, appears stated age  Eyes: EOMI  HEENT:  NCAT, MMM  Neck: no carotid bruit, no JVD  Heme/Lymphatic/Immune:  No lymphadenopathy  Lungs:  clear to auscultation bilaterally  Heart:  regular rate and rhythm, S1, S2 normal, no murmur, click, rub or gallop  Abdomen/GI:  abdomen is soft without significant tenderness, or guarding  Genitourinary:  No dysuria  Extremities:  extremities atraumatic, no cyanosis or edema  Skin: Warm and dry. no hyperpigmentation or gross llesions  Neuro: no gross neurological deficits  Psych: appropriate mood and affect     Data Review:     Recent Labs     01/05/19  1308   WBC 5.7   HGB 14.2   HCT 42.5   PLT 223     Recent Labs     01/05/19  1308   NA 138   K 4.0   CL 106   CO2 25   GLU 337*   BUN 16   CREA 0.95   CA 9.3   MG 1.7   ALB 3.6   ALT 163*   INR 1.0       Results for orders placed or performed during the hospital encounter of 01/05/19   EKG, 12 LEAD, INITIAL   Result Value Ref Range    Heart Rate 59 bpm    RR Interval 1,017 ms    Atrial Rate 59 ms    P-R Interval 197 ms    P Duration 131 ms    P Horizontal Axis -9 deg    P Front Axis 28 deg    Q Onset 499 ms    QRSD Interval 96 ms    QT Interval 384 ms    QTcB 381 ms    QTcF 382 ms    QRS Horizontal Axis -37 deg    QRS Axis 46 deg    I-40 Horizontal Axis 63 deg     I-40 Front Axis 22 deg    T-40 Horizontal Axis -56 deg    T-40 Front Axis 81 deg    T Horizontal Axis 33 deg    T Wave Axis 58 deg    S-T Horizontal Axis 53 deg    S-T Front Axis 54 deg       All Cardiac Markers in the last 24 hours:    Lab Results   Component Value Date/Time    TROIQ <0.015 01/06/2019 03:16 AM    TROIQ <0.015 01/05/2019 08:24 PM    TROIQ 0.019 01/05/2019 04:24 PM    TROIQ <0.015 01/05/2019 01:08 PM       Last Lipid:  No results found for: CHOL, CHOLX, CHLST, CHOLV, HDL, HDLP, LDL, LDLC, DLDLP, TGLX, TRIGL,  TRIGP, CHHD, CHHDX     Assessment/Plan:     This is a 43 year old gentleman with a 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  He has not had anginal pain with exertion, current pain occurs in the setting of the recent death of his mother.  He is now chest pain free.  His vitals are stable and has been ruled out for myocardial infarction.  Echocardiogram shows normal ejection fraction of 60%, mild mitral regurgitation.    Plan  He is okay for discharge home today on his current medications including daily baby aspirin, atorvastatin, metoprolol  We will obtain records of his [facility], also most recent cardiology records from Dr. Vear ClockPhillips in [place]  [place] and like to establish cardiovascular care with the [facility].  He will follow-up with me in 6 months, sooner if needed.      Thank you for the opportunity to participate in his care.      Signed By: [place] RidingSamuel W Desa Rech, MD     January 06, 2019

## 2019-01-06 NOTE — Other (Signed)
Patient left AMA at 7:38am. Patient called from the main desk at 10:15 he wanted a taxi voucher to go [place], [employer] he was explained that we do not go that far. He then called back and asked for a voucher [place] on [place] [address]. I spoke to  case management and request was denied. Patient is aware.

## 2019-01-06 NOTE — Progress Notes (Signed)
Pt was adamant to leave ama. Pt educated by md to stay to get the care he needs. Pt was upset with night staff RN. Pt wanted iv pain med and Rn would only give him tylenol. Pt to follow up with dr joffe cardiology as outpatient. Pt got md card and was educated to have him call for follow up. Pt signed ama form. Iv was dcd no bleeding or hematoma noted. Pt was walked to the lobby for dc. Patient had call the floor to try to get a taxi voucher to go to [place]. We told the patient that was too far and that he needs to call someone for a ride. Pt was also educated to get a pcp for f/u care. Pt is discharged.
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
## 2019-01-06 NOTE — Progress Notes (Signed)
Problem: Falls - Risk of  Goal: *Absence of Falls  Description: Document Schmid Fall Risk and appropriate interventions in the flowsheet.  Outcome: Progressing Towards Goal  Note: Fall Risk Interventions:                                Problem: Patient Education: Go to Patient Education Activity  Goal: Patient/Family Education  Outcome: Progressing Towards Goal

## 2019-01-07 ENCOUNTER — Emergency Department
Admit: 2019-01-07 | Disposition: A | Discharge status: Home / Self Care | Source: Home / Self Care | Attending: Pediatrics | Admitting: Pediatrics

## 2019-01-07 ENCOUNTER — Emergency Department
Admit: 2019-01-07 | Disposition: A | Discharge status: Home / Self Care | Source: Home / Self Care | Attending: Emergency Medicine | Admitting: Emergency Medicine

## 2019-01-07 LAB — HX BASIC METABOLIC PANEL
CASE NUMBER: 2020232003866
HX ANION GAP: 4 — NL (ref 3.0–11.0)
HX BUN: 16 mg/dL — NL (ref 6.0–20.0)
HX CALCIUM LVL: 9.1 mg/dL — NL (ref 8.5–10.5)
HX CHLORIDE: 107 mmol/L — NL (ref 98.0–110.0)
HX CO2: 28 mmol/L — NL (ref 21.0–32.0)
HX CREATININE: 1.01 mg/dL — NL (ref 0.55–1.3)
HX GLUCOSE LVL: 330 mg/dL — ABNORMAL HIGH (ref 70.0–110.0)
HX POTASSIUM LVL: 4.3 mmol/L — NL (ref 3.6–5.2)
HX SODIUM LVL: 139 mmol/L — NL (ref 136.0–146.0)

## 2019-01-07 LAB — HX LAVENDER TOP TO HOLD: CASE NUMBER: 2020232003866

## 2019-01-07 LAB — HX CBC W/ DIFF
CASE NUMBER: 2020232002041
HX ABSOLUTE NRBC COUNT: 0 10*3/uL
HX HCT: 46.4 % — NL (ref 39.0–53.0)
HX HGB: 14.8 g/dL — NL (ref 13.0–17.5)
HX MCH: 29.2 pg — NL (ref 26.0–34.0)
HX MCHC: 31.9 g/dL — NL (ref 31.0–37.0)
HX MCV: 91.7 fL — NL (ref 80.0–100.0)
HX MPV: 11.2 fL — NL (ref 9.4–12.4)
HX NRBC PERCENT: 0 % — NL
HX PLATELET: 212 10*3/uL — NL (ref 150.0–400.0)
HX RBC: 5.06 10*6/uL — NL (ref 4.2–5.9)
HX RDW-CV: 13.6 % — NL (ref 11.5–14.5)
HX RDW-SD: 45.6 fL — NL (ref 35.0–51.0)
HX WBC: 5.2 10*3/uL — NL (ref 4.0–11.0)

## 2019-01-07 LAB — HX .AUTOMATED DIFF
CASE NUMBER: 2020232002041
HX ABSOLUTE BASO COUNT: 0.01 10*3/uL — NL (ref 0.0–0.22)
HX ABSOLUTE EOS COUNT: 0.09 10*3/uL — NL (ref 0.0–0.45)
HX ABSOLUTE LYMPHS COUNT: 1.46 10*3/uL — NL (ref 0.74–5.04)
HX ABSOLUTE MONO COUNT: 0.3 10*3/uL — NL (ref 0.0–1.34)
HX ABSOLUTE NEUTRO COUNT: 3.31 10*3/uL — NL (ref 1.48–7.95)
HX BASOPHILS: 0.2 %
HX EOSINOPHILS: 1.7 %
HX IMMATURE GRANULOCYTES: 0.2 % — NL (ref 0.0–2.0)
HX LYMPHOCYTES: 28.2 %
HX MONOCYTES: 5.8 %
HX NEUTROPHILS: 63.9 %

## 2019-01-07 LAB — HX BLUE TOP TO HOLD
CASE NUMBER: 2020232002041
CASE NUMBER: 2020232003866

## 2019-01-07 LAB — HX COMPREHENSIVE METABOLIC PANEL
CASE NUMBER: 2020232002041
HX ALBUMIN LVL: 3.6 g/dL — NL (ref 3.2–5.0)
HX ALKALINE PHOSPHATASE: 72 U/L — NL (ref 30.0–117.0)
HX ALT: 159 U/L — ABNORMAL HIGH (ref 6.0–55.0)
HX ANION GAP: 4 — NL (ref 3.0–11.0)
HX AST: 84 U/L — ABNORMAL HIGH (ref 6.0–40.0)
HX BILIRUBIN TOTAL: 0.5 mg/dL — NL (ref 0.2–1.2)
HX BUN: 13 mg/dL — NL (ref 6.0–20.0)
HX CALCIUM LVL: 9.2 mg/dL — NL (ref 8.5–10.5)
HX CHLORIDE: 103 mmol/L — NL (ref 98.0–110.0)
HX CO2: 28 mmol/L — NL (ref 21.0–32.0)
HX CREATININE: 1.07 mg/dL — NL (ref 0.55–1.3)
HX GLUCOSE LVL: 348 mg/dL — ABNORMAL HIGH (ref 70.0–110.0)
HX POTASSIUM LVL: 4.1 mmol/L — NL (ref 3.6–5.2)
HX SODIUM LVL: 135 mmol/L — ABNORMAL LOW (ref 136.0–146.0)
HX TOTAL PROTEIN: 8 g/dL — NL (ref 6.0–8.4)

## 2019-01-07 LAB — HX TROPONIN I
CASE NUMBER: 2020232002041
CASE NUMBER: 2020232002483
CASE NUMBER: 2020232003866
HX TROPONIN I: <0.015 — NL (ref 0.015–0.045)
HX TROPONIN I: <0.015 — NL (ref 0.015–0.045)
HX TROPONIN I: 0.018 ng/mL — NL (ref 0.015–0.045)

## 2019-01-07 LAB — HX SST GOLD TUBE TO HOLD: CASE NUMBER: 2020232002041

## 2019-01-07 LAB — HX GLOMERULAR FILTRATION RATE (ESTIMATED)
CASE NUMBER: 2020232002041
HX AFN AMER GLOMERULAR FILTRATION RATE: >90
HX NON-AFN AMER GLOMERULAR FILTRATION RATE: 86 mL/min/{1.73_m2}

## 2019-01-07 NOTE — ED Notes (Signed)
Please click on link to see document

## 2019-01-07 NOTE — ED Notes (Signed)
Please click on link to see document

## 2019-01-08 ENCOUNTER — Ambulatory Visit

## 2019-01-08 ENCOUNTER — Ambulatory Visit (HOSPITAL_BASED_OUTPATIENT_CLINIC_OR_DEPARTMENT_OTHER): Admitting: Cardiovascular Disease

## 2019-01-08 LAB — BMP (EXT)
Anion Gap (EXT): 10 mmol/L (ref 3–17)
BUN (EXT): 15 mg/dL (ref 9–23)
CO2 (EXT): 25 mmol/L (ref 20–31)
CalciumCalcium (EXT): 9.3 mg/dL (ref 8.7–10.4)
Chloride (EXT): 102 mmol/L (ref 95–106)
Creatinine (EXT): 0.86 mg/dL (ref 0.50–1.30)
GFR Estimated (Calc) (EXT): 108 mL/min/{1.73_m2} (ref >60)
Glucose (EXT): 316 mg/dL — ABNORMAL HIGH (ref 74–106)
Potassium (EXT): 4.3 mmol/L (ref 3.5–5.2)
Sodium (EXT): 137 mmol/L (ref 136–145)

## 2019-01-08 LAB — HEMOGLOBIN A1C
Estimated Average Glucose mg/dL (INT/EXT): 278 mg/dL
HEMOGLOBIN A1C % (INT/EXT): 11.3 % — ABNORMAL HIGH (ref 4.3–5.6)

## 2019-01-08 LAB — LAB EXTERNAL RESULT UNMAPPED

## 2019-01-08 LAB — COVID-19 CARE EVERYWHERE: COVID-19 CARE EVERYWHERE: NOT DETECTED — NL

## 2019-01-08 LAB — HEMOGLOBIN A1C CARE EVERYWHERE
HEMOGLOBIN A1C CARE EVERYWHERE: 11.3 % — ABNORMAL HIGH (ref 4.3–5.6)
MEAN BLOOD GLUCOSE CARE EVERYWHERE: 278 mg/dL — NL

## 2019-01-09 ENCOUNTER — Ambulatory Visit

## 2019-01-09 LAB — COVID-19 CARE EVERYWHERE: COVID-19 CARE EVERYWHERE: NEGATIVE

## 2019-01-09 LAB — HEMOGLOBIN A1C CARE EVERYWHERE: HEMOGLOBIN A1C CARE EVERYWHERE: 11.4 % — ABNORMAL HIGH (ref 4.1–5.7)

## 2019-01-11 LAB — PROTHROMBIN TIME CARE EVERYWHERE: INR CARE EVERYWHERE: 1.1 — NL (ref <1.3)

## 2019-01-13 LAB — LIPID PROFILE (EXT)
Chol/HDL Ratio (EXT): 3.9
Cholesterol (EXT): 118 mg/dL (ref <200)
HDL Cholesterol (EXT): 30 mg/dL — ABNORMAL LOW (ref >=40)
LDL Cholesterol (EXT): 68 mg/dL (ref <130)
Triglycerides (EXT): 99 mg/dL (ref <150)

## 2019-01-13 LAB — HEMOGLOBIN A1C CARE EVERYWHERE
ESTIMATED AVERAGE GLUCOSE CARE EVERYWHERE: 283 mg/dL — NL
HEMOGLOBIN A1C CARE EVERYWHERE: 11.5 % — ABNORMAL HIGH (ref <5.7)

## 2019-01-13 LAB — COVID-19 CARE EVERYWHERE: COVID-19 CARE EVERYWHERE: NOT DETECTED — NL

## 2019-02-09 ENCOUNTER — Emergency Department
Admit: 2019-02-09 | Discharge status: Against Medical Advice | Source: Home / Self Care | Attending: Internal Medicine | Admitting: Internal Medicine

## 2019-02-09 ENCOUNTER — Emergency Department
Admit: 2019-02-09 | Disposition: A | Discharge status: Home / Self Care | Source: Home / Self Care | Attending: Emergency Medicine | Admitting: Emergency Medicine

## 2019-02-09 LAB — HX COMPREHENSIVE METABOLIC PANEL
CASE NUMBER: 2020265000548
CASE NUMBER: 2020265002436
HX ALBUMIN LVL: 3.6 g/dL — NL (ref 3.2–5.0)
HX ALBUMIN LVL: 3.3 g/dL — NL (ref 3.2–5.0)
HX ALKALINE PHOSPHATASE: 78 U/L — NL (ref 30.0–117.0)
HX ALKALINE PHOSPHATASE: 74 U/L — NL (ref 30.0–117.0)
HX ALT: 116 U/L — ABNORMAL HIGH (ref 6.0–55.0)
HX ALT: 97 U/L — ABNORMAL HIGH (ref 6.0–55.0)
HX ANION GAP: 6 — NL (ref 3.0–11.0)
HX ANION GAP: 5 — NL (ref 3.0–11.0)
HX AST: 53 U/L — ABNORMAL HIGH (ref 6.0–40.0)
HX AST: 48 U/L — ABNORMAL HIGH (ref 6.0–40.0)
HX BILIRUBIN TOTAL: 0.4 mg/dL — NL (ref 0.2–1.2)
HX BILIRUBIN TOTAL: 0.2 mg/dL — NL (ref 0.2–1.2)
HX BUN: 13 mg/dL — NL (ref 6.0–20.0)
HX BUN: 13 mg/dL — NL (ref 6.0–20.0)
HX CALCIUM LVL: 9.2 mg/dL — NL (ref 8.5–10.5)
HX CALCIUM LVL: 9.1 mg/dL — NL (ref 8.5–10.5)
HX CHLORIDE: 107 mmol/L — NL (ref 98.0–110.0)
HX CHLORIDE: 107 mmol/L — NL (ref 98.0–110.0)
HX CO2: 25 mmol/L — NL (ref 21.0–32.0)
HX CO2: 28 mmol/L — NL (ref 21.0–32.0)
HX CREATININE: 1.11 mg/dL — NL (ref 0.55–1.3)
HX CREATININE: 1.17 mg/dL — NL (ref 0.55–1.3)
HX GLUCOSE LVL: 239 mg/dL — ABNORMAL HIGH (ref 70.0–110.0)
HX GLUCOSE LVL: 234 mg/dL — ABNORMAL HIGH (ref 70.0–110.0)
HX POTASSIUM LVL: 3.5 mmol/L — ABNORMAL LOW (ref 3.6–5.2)
HX POTASSIUM LVL: 3.7 mmol/L — NL (ref 3.6–5.2)
HX SODIUM LVL: 138 mmol/L — NL (ref 136.0–146.0)
HX SODIUM LVL: 140 mmol/L — NL (ref 136.0–146.0)
HX TOTAL PROTEIN: 8.1 g/dL — NL (ref 6.0–8.4)
HX TOTAL PROTEIN: 7.3 g/dL — NL (ref 6.0–8.4)

## 2019-02-09 LAB — HX SST GOLD TUBE TO HOLD
CASE NUMBER: 2020265000548
CASE NUMBER: 2020265002436

## 2019-02-09 LAB — HX .AUTOMATED DIFF
CASE NUMBER: 2020265000548
CASE NUMBER: 2020265002436
HX ABSOLUTE BASO COUNT: 0.02 10*3/uL — NL (ref 0.0–0.22)
HX ABSOLUTE BASO COUNT: 0.02 10*3/uL — NL (ref 0.0–0.22)
HX ABSOLUTE EOS COUNT: 0.09 10*3/uL — NL (ref 0.0–0.45)
HX ABSOLUTE EOS COUNT: 0.13 10*3/uL — NL (ref 0.0–0.45)
HX ABSOLUTE LYMPHS COUNT: 1.64 10*3/uL — NL (ref 0.74–5.04)
HX ABSOLUTE LYMPHS COUNT: 1.76 10*3/uL — NL (ref 0.74–5.04)
HX ABSOLUTE MONO COUNT: 0.32 10*3/uL — NL (ref 0.0–1.34)
HX ABSOLUTE MONO COUNT: 0.41 10*3/uL — NL (ref 0.0–1.34)
HX ABSOLUTE NEUTRO COUNT: 4.15 10*3/uL — NL (ref 1.48–7.95)
HX ABSOLUTE NEUTRO COUNT: 4.45 10*3/uL — NL (ref 1.48–7.95)
HX BASOPHILS: 0.3 %
HX BASOPHILS: 0.3 %
HX EOSINOPHILS: 1.4 %
HX EOSINOPHILS: 1.9 %
HX IMMATURE GRANULOCYTES: 0.5 % — NL (ref 0.0–2.0)
HX IMMATURE GRANULOCYTES: 0.4 % — NL (ref 0.0–2.0)
HX LYMPHOCYTES: 26.2 %
HX LYMPHOCYTES: 25.9 %
HX MONOCYTES: 5.1 %
HX MONOCYTES: 6 %
HX NEUTROPHILS: 66.5 %
HX NEUTROPHILS: 65.5 %

## 2019-02-09 LAB — HX GLOMERULAR FILTRATION RATE (ESTIMATED)
CASE NUMBER: 2020265000548
HX AFN AMER GLOMERULAR FILTRATION RATE: >90
HX NON-AFN AMER GLOMERULAR FILTRATION RATE: 82 mL/min/{1.73_m2}

## 2019-02-09 LAB — HX CBC W/ DIFF
CASE NUMBER: 2020265000548
CASE NUMBER: 2020265002436
HX ABSOLUTE NRBC COUNT: 0 10*3/uL
HX ABSOLUTE NRBC COUNT: 0 10*3/uL
HX HCT: 43.8 % — NL (ref 39.0–53.0)
HX HCT: 39.7 % — NL (ref 39.0–53.0)
HX HGB: 13.9 g/dL — NL (ref 13.0–17.5)
HX HGB: 12.7 g/dL — ABNORMAL LOW (ref 13.0–17.5)
HX MCH: 29.6 pg — NL (ref 26.0–34.0)
HX MCH: 28.8 pg — NL (ref 26.0–34.0)
HX MCHC: 31.7 g/dL — NL (ref 31.0–37.0)
HX MCHC: 32 g/dL — NL (ref 31.0–37.0)
HX MCV: 93.2 fL — NL (ref 80.0–100.0)
HX MCV: 90 fL — NL (ref 80.0–100.0)
HX MPV: 10.8 fL — NL (ref 9.4–12.4)
HX MPV: 10.9 fL — NL (ref 9.4–12.4)
HX NRBC PERCENT: 0 % — NL
HX NRBC PERCENT: 0 % — NL
HX PLATELET: 215 10*3/uL — NL (ref 150.0–400.0)
HX PLATELET: 211 10*3/uL — NL (ref 150.0–400.0)
HX RBC: 4.7 10*6/uL — NL (ref 4.2–5.9)
HX RBC: 4.41 10*6/uL — NL (ref 4.2–5.9)
HX RDW-CV: 13.4 % — NL (ref 11.5–14.5)
HX RDW-CV: 13.5 % — NL (ref 11.5–14.5)
HX RDW-SD: 46.3 fL — NL (ref 35.0–51.0)
HX RDW-SD: 44.7 fL — NL (ref 35.0–51.0)
HX WBC: 6.2 10*3/uL — NL (ref 4.0–11.0)
HX WBC: 6.8 10*3/uL — NL (ref 4.0–11.0)

## 2019-02-09 LAB — HX TROPONIN I
CASE NUMBER: 2020265000548
CASE NUMBER: 2020265002436
CASE NUMBER: 2020265002754
HX TROPONIN I: <0.015 — NL (ref 0.015–0.045)
HX TROPONIN I: <0.015 — NL (ref 0.015–0.045)
HX TROPONIN I: <0.015 — NL (ref 0.015–0.045)

## 2019-02-09 LAB — HX BLUE TOP TO HOLD
CASE NUMBER: 2020265000548
CASE NUMBER: 2020265002436

## 2019-02-09 LAB — HX D-DIMER QUANTITATIVE
CASE NUMBER: 19994904
HX D-DIMER QUANT: 0.49 mg{FEU}/L — NL

## 2019-02-09 LAB — HX ETHANOL LEVEL
CASE NUMBER: 19994904
HX ETHANOL LVL: <3 — NL (ref 0.0–3.0)

## 2019-02-09 LAB — HX LIPASE LEVEL
CASE NUMBER: 19994904
HX LIPASE LVL: 100 U/L — NL (ref 73.0–393.0)

## 2019-02-09 NOTE — ED Notes (Signed)
Please click on link to see document

## 2019-02-17 LAB — HEMOGLOBIN A1C: HEMOGLOBIN A1C % (INT/EXT): 10.3 % — ABNORMAL HIGH (ref <5.7)

## 2019-02-17 LAB — HEMOGLOBIN A1C CARE EVERYWHERE: HEMOGLOBIN A1C CARE EVERYWHERE: 10.3 % — ABNORMAL HIGH (ref <5.7)

## 2019-03-03 ENCOUNTER — Inpatient Hospital Stay
Admit: 2019-03-03 | Discharge: 2019-03-03 | Disposition: A | Discharge status: Home Health | Payer: PRIVATE HEALTH INSURANCE | Attending: Physician Assistant

## 2019-03-03 DIAGNOSIS — K029 Dental caries, unspecified: Secondary | ICD-10-CM

## 2019-03-03 MED ORDER — OXYCODONE 5 MG TAB
5 mg | ORAL | Status: DC
Start: 2019-03-03 — End: 2019-03-03
  Administered 2019-03-03: 20:00:00 via ORAL

## 2019-03-03 MED ORDER — LIDOCAINE 2 % MUCOSAL SOLN
2 % | Freq: Two times a day (BID) | 0 refills | Status: DC
Start: 2019-03-03 — End: 2020-08-16

## 2019-03-03 MED ORDER — PENICILLIN V-K 500 MG TAB
500 mg | ORAL_TABLET | Freq: Four times a day (QID) | ORAL | 0 refills | Status: AC
Start: 2019-03-03 — End: 2019-03-13

## 2019-03-03 MED FILL — OXYCODONE 5 MG TAB: 5 mg | ORAL | Qty: 1

## 2019-03-03 NOTE — ED Notes (Signed)
States has had pain in front bottom teeth x 2 days, teeth are decayed and fractured

## 2019-03-03 NOTE — ED Provider Notes (Signed)
43 year old [place] American male with past medical history of myocardial infarction x3, CABG presents to the emergency department with 24 hours of dental pain.  Localizes pain to lower front teeth.  Endorses a history of poor dentition.  He has had multiple dental extractions.  He has a follow-up appointment with dentist to consider further extraction in 1 week.  Notes significant enamel erosion to lower front teeth.  Localizes pain to this area.  No trouble swallowing oral secretions or solid foods.  No fevers or chills.  No facial swelling.  Has been seen for bouts of chest pain at emergency department in [facility] multiple times over the past few months.  Last seen 02/16/2019.  Notation reviewed.  The patient is denying any active chest pain at this time.  No shortness of breath.   He has been taking lots of Tylenol with no significant improvement.  He is allergic to ibuprofen, tramadol, codeine and gets hives when he received these medications.  He is on daily aspirin 81 mg, Lipitor, metoprolol.  Presents in no visible distress for further evaluation.           Past Medical History:   Diagnosis Date   ??? CAD (coronary artery disease)     MI x 3   ??? Cardiac revascularization with aortocoronary bypass anastomosis    ??? Chronic dental pain     - multiple teeth remove   ??? Chronic knee pain    ??? Hypertension    ??? Myocardial infarct [facility])    ??? S/P CABG x 1 2007    CABG x 1, 2007, due to congenital heart disease--patient's explanation is that right coronary artery was never in the proper location (perfused left side of heart only) and he had repeat cabage surgery in 2012 to correct this.   ??? Thromboembolus [facility])        Past Surgical History:   Procedure Laterality Date   ??? HX CHOLECYSTECTOMY  08/2010        ??? HX CORONARY ARTERY BYPASS GRAFT  2007, 2012    2007: due to congenital heart disease - patients explanation is that right coronary artery was never in the proper location (perfused left side of heart only) and  had repeat CABG surgery in 2012 to correct this   ??? HX OTHER SURGICAL  11/30/2013    TOOTH EXTRACTION         Family History:   Problem Relation Age of Onset   ??? Heart Disease Mother    ??? Cancer Mother         BRAIN CANCER   ??? Diabetes Father    ??? Diabetes Brother    ??? Heart Disease Maternal Grandmother    ??? Dementia Maternal Grandmother    ??? Heart Disease Maternal Grandfather    ??? Heart Disease Paternal Grandmother    ??? Heart Disease Paternal Grandfather    ??? No Known Problems Other    ??? Heart Disease Brother        Social History     Socioeconomic History   ??? Marital status: SINGLE     Spouse name: Not on file   ??? Number of children: 0   ??? Years of education: Not on file   ??? Highest education level: Not on file   Occupational History     Comment: DISABILITY   Social Needs   ??? Financial resource strain: Not on file   ??? Food insecurity     Worry: Not on  file     Inability: Not on file   ??? Transportation needs     Medical: Not on file     Non-medical: Not on file   Tobacco Use   ??? Smoking status: Current Some Day Smoker     Packs/day: 0.25     Years: 30.00     Pack years: 7.50     Types: Cigarettes   ??? Smokeless tobacco: Former Neurosurgeon   ??? Tobacco comment: About 40-50 pack years, used to smoke 2 PPD but now down to 2 cigarettes a day   Substance and Sexual Activity   ??? Alcohol use: No   ??? Drug use: No   ??? Sexual activity: Not on file   Lifestyle   ??? Physical activity     Days per week: Not on file     Minutes per session: Not on file   ??? Stress: Not on file   Relationships   ??? Social Wellsite geologist on phone: Not on file     Gets together: Not on file     Attends religious service: Not on file     Active member of club or organization: Not on file     Attends meetings of clubs or organizations: Not on file     Relationship status: Not on file   ??? Intimate partner violence     Fear of current or ex partner: Not on file     Emotionally abused: Not on file     Physically abused: Not on file     Forced sexual  activity: Not on file   Other Topics Concern   ??? Military Service Not Asked   ??? Blood Transfusions Not Asked   ??? Caffeine Concern Not Asked   ??? Occupational Exposure Not Asked   ??? Hobby Hazards Not Asked   ??? Sleep Concern Not Asked   ??? Stress Concern Not Asked   ??? Weight Concern Not Asked   ??? Special Diet Not Asked   ??? Back Care Not Asked   ??? Exercise Not Asked   ??? Bike Helmet Not Asked   ??? Seat Belt Not Asked   ??? Self-Exams Not Asked   Social History Narrative    He is from [place] but moved to [place] at age 43.  Has been between [place] and [address]    Lives with wife, married since 2016     No children    Disability secondary to heart disease and syncopal episode    About 40-50 pack years, used to smoke 2 PPD but now down to 2 cigarettes a day    No EtOH    No drug use         ALLERGIES: Coconut; Codeine; Morphine; Motrin [ibuprofen]; Toradol [ketorolac]; and Tramadol    Review of Systems   Constitutional: Negative for chills and fever.   HENT: Positive for dental problem. Negative for facial swelling, sore throat, trouble swallowing and voice change.    Respiratory: Negative for apnea, choking, chest tightness, shortness of breath and stridor.    Cardiovascular: Negative for chest pain.   Gastrointestinal: Negative for abdominal pain, nausea and vomiting.   Musculoskeletal: Negative for neck pain and neck stiffness.       Vitals:    03/03/19 1525   BP: (!) 158/88   Pulse: 82   Resp: 16   Temp: 98.1 ??F (36.7 ??C)   SpO2: 96%   Weight: 122.5 kg (270 lb)  Height:  (1.93 m)            Physical Exam     GENERAL: well-developed, well-nourished, in no distress  HEAD: atraumatic, normocephalic  EYES: PERRL  FACE: Nontender palpating the mandibular ramus or bodies bilaterally.  No facial erythema, edema, or palpable induration.  Nontender palpation of facial structures.  Grossly, face is symmetric.  EARS:  External ears normal  MOUTH: no lip lesions; buccal mucosa moist without lesions, edema and tenderness;  most dentition is absent, remaining lower anterior dentition with significant enamel erosion, black necrotic changes, localizes pain to anterior lower dentition; no gingival redness, edema, induration or fluctuance; no dental or gingival tenderness; no tooth laxity  OROPHARYNX:   Uvula midline.  Airway patent. Moist mucous membranes.  Tongue moist. No pharyngeal erythema noted. Nontender with palpation of the submandibular structures.  No submandibular edema or effusions.  NECK: supple, no adenopathy, full ROM  SKIN:  Visible skin structures intact without rash, pallor, or petechiae          MDM  Number of Diagnoses or Management Options  Pain due to dental caries:   Diagnosis management comments: Patient is nontoxic appearing.  Patient appears well hydrated. Without trismus or mastoid tenderness. Airway is not compromised.  Patient presents with dental pain localized to the anterior lower dental region with there is significant enamel erosion, necrotic changes.  There is no local gingival induration or edema.  No gingival abscess.  No indication for incision and drainage.  No submandibular fullness or induration or tenderness.  No sign of otitis media, bacterial sinusitis, retropharyngeal abscess, epiglottitis, peritonsillar abscess,  soft tissue infection of neck, Ludwig's angina, or pneumonia.  Cannot exclude pulp space infection.  Patient prescribed antibiotics, 10 day course of penicillin.  The patient was offered Magic mouthwash for analgesia but notes that he has plenty of this at home.  The patient has a scheduled appointment with his dentist within 7 days from now. Provided a singular dose of oxycodone  to take this evening when he returns home from the emergency department to help bridge pain relief as penicillin takes affect.  He will continue Tylenol.  Instructed to return if trouble swallowing, trouble breathing, or unresolved symptoms after antibiotic course.  He will follow up with dentist.   Discharged at this time in no acute distress.         Procedures      NIH Stroke Scale

## 2019-03-03 NOTE — ED Notes (Signed)
Discharge instructions new medications changes reviewed with patient and caregiver. Patient caregiver states understanding of discharge plan and follow up care. Patient is alert calm and cooperative at time of discharge. patients gait is steady.

## 2019-03-03 NOTE — ED Notes (Signed)
 Pt c/o front bottom teeth pain x 2 days  Pt states I have hx of heart problems that make me have dental problems  States unable to see his dentist until next week

## 2019-03-03 NOTE — ED Triage Notes (Signed)
Pt c/o front bottom teeth pain x 2 days  Pt states "I have hx of heart problems that make me have dental problems"  States unable to see his dentist until next week

## 2019-03-03 NOTE — ED Notes (Signed)
States has had pain in front bottom teeth x 2 days, teeth are decayed and fractured

## 2019-03-03 NOTE — ED Provider Notes (Signed)
43 year old [place] American male with past medical history of myocardial infarction x3, CABG presents to the emergency department with 24 hours of dental pain.  Localizes pain to lower front teeth.  Endorses a history of poor dentition.  He has had multiple dental extractions.  He has a follow-up appointment with dentist to consider further extraction in 1 week.  Notes significant enamel erosion to lower front teeth.  Localizes pain to this area.  No trouble swallowing oral secretions or solid foods.  No fevers or chills.  No facial swelling.  Has been seen for bouts of chest pain at emergency department in [facility] multiple times over the past few months.  Last seen 02/16/2019.  Notation reviewed.  The patient is denying any active chest pain at this time.  No shortness of breath.   He has been taking lots of Tylenol with no significant improvement.  He is allergic to ibuprofen, tramadol, codeine and gets hives when he received these medications.  He is on daily aspirin 81 mg, Lipitor, metoprolol.  Presents in no visible distress for further evaluation.           Past Medical History:   Diagnosis Date   ??? CAD (coronary artery disease)     MI x 3   ??? Cardiac revascularization with aortocoronary bypass anastomosis    ??? Chronic dental pain     - multiple teeth remove   ??? Chronic knee pain    ??? Hypertension    ??? Myocardial infarct [facility])    ??? S/P CABG x 1 2007    CABG x 1, 2007, due to congenital heart disease--patient's explanation is that right coronary artery was never in the proper location (perfused left side of heart only) and he had repeat cabage surgery in 2012 to correct this.   ??? Thromboembolus [facility])        Past Surgical History:   Procedure Laterality Date   ??? HX CHOLECYSTECTOMY  08/2010        ??? HX CORONARY ARTERY BYPASS GRAFT  2007, 2012    2007: due to congenital heart disease - patients explanation is that right coronary artery was never in the proper location (perfused left side  of heart only) and had repeat CABG surgery in 2012 to correct this   ??? HX OTHER SURGICAL  11/30/2013    TOOTH EXTRACTION         Family History:   Problem Relation Age of Onset   ??? Heart Disease Mother    ??? Cancer Mother         BRAIN CANCER   ??? Diabetes Father    ??? Diabetes Brother    ??? Heart Disease Maternal Grandmother    ??? Dementia Maternal Grandmother    ??? Heart Disease Maternal Grandfather    ??? Heart Disease Paternal Grandmother    ??? Heart Disease Paternal Grandfather    ??? No Known Problems Other    ??? Heart Disease Brother        Social History     Socioeconomic History   ??? Marital status: SINGLE     Spouse name: Not on file   ??? Number of children: 0   ??? Years of education: Not on file   ??? Highest education level: Not on file   Occupational History     Comment: DISABILITY   Social Needs   ??? Financial resource strain: Not on file   ??? Food insecurity     Worry: Not on  file     Inability: Not on file   ??? Transportation needs     Medical: Not on file     Non-medical: Not on file   Tobacco Use   ??? Smoking status: Current Some Day Smoker     Packs/day: 0.25     Years: 30.00     Pack years: 7.50     Types: Cigarettes   ??? Smokeless tobacco: Former NeurosurgeonUser   ??? Tobacco comment: About 40-50 pack years, used to smoke 2 PPD but now down to 2 cigarettes a day   Substance and Sexual Activity   ??? Alcohol use: No   ??? Drug use: No   ??? Sexual activity: Not on file   Lifestyle   ??? Physical activity     Days per week: Not on file     Minutes per session: Not on file   ??? Stress: Not on file   Relationships   ??? Social Wellsite geologistconnections     Talks on phone: Not on file     Gets together: Not on file     Attends religious service: Not on file     Active member of club or organization: Not on file     Attends meetings of clubs or organizations: Not on file     Relationship status: Not on file   ??? Intimate partner violence     Fear of current or ex partner: Not on file     Emotionally abused: Not on file     Physically abused: Not on file      Forced sexual activity: Not on file   Other Topics Concern   ??? Military Service Not Asked   ??? Blood Transfusions Not Asked   ??? Caffeine Concern Not Asked   ??? Occupational Exposure Not Asked   ??? Hobby Hazards Not Asked   ??? Sleep Concern Not Asked   ??? Stress Concern Not Asked   ??? Weight Concern Not Asked   ??? Special Diet Not Asked   ??? Back Care Not Asked   ??? Exercise Not Asked   ??? Bike Helmet Not Asked   ??? Seat Belt Not Asked   ??? Self-Exams Not Asked   Social History Narrative    He is from [place] but moved to [place] at age 43.  Has been between [place] and [address] [place]    Lives with wife, married since 2016     No children    Disability secondary to heart disease and syncopal episode    About 40-50 pack years, used to smoke 2 PPD but now down to 2 cigarettes a day    No EtOH    No drug use         ALLERGIES: Coconut; Codeine; Morphine; Motrin [ibuprofen]; Toradol [ketorolac]; and Tramadol    Review of Systems   Constitutional: Negative for chills and fever.   HENT: Positive for dental problem. Negative for facial swelling, sore throat, trouble swallowing and voice change.    Respiratory: Negative for apnea, choking, chest tightness, shortness of breath and stridor.    Cardiovascular: Negative for chest pain.   Gastrointestinal: Negative for abdominal pain, nausea and vomiting.   Musculoskeletal: Negative for neck pain and neck stiffness.       Vitals:    03/03/19 1525   BP: (!) 158/88   Pulse: 82   Resp: 16   Temp: 98.1 ??F (36.7 ??C)   SpO2: 96%   Weight: 122.5 kg (270 lb)  Height: 6\' 4"  (1.93 m)            Physical Exam     GENERAL: well-developed, well-nourished, in no distress  HEAD: atraumatic, normocephalic  EYES: PERRL  FACE: Nontender palpating the mandibular ramus or bodies bilaterally.  No facial erythema, edema, or palpable induration.  Nontender palpation of facial structures.  Grossly, face is symmetric.  EARS:  External ears normal  MOUTH: no lip lesions; buccal mucosa moist without lesions, edema and  tenderness; most dentition is absent, remaining lower anterior dentition with significant enamel erosion, black necrotic changes, localizes pain to anterior lower dentition; no gingival redness, edema, induration or fluctuance; no dental or gingival tenderness; no tooth laxity  OROPHARYNX:   Uvula midline.  Airway patent. Moist mucous membranes.  Tongue moist. No pharyngeal erythema noted. Nontender with palpation of the submandibular structures.  No submandibular edema or effusions.  NECK: supple, no adenopathy, full ROM  SKIN:  Visible skin structures intact without rash, pallor, or petechiae          MDM  Number of Diagnoses or Management Options  Pain due to dental caries:   Diagnosis management comments: Patient is nontoxic appearing.  Patient appears well hydrated. Without trismus or mastoid tenderness. Airway is not compromised.  Patient presents with dental pain localized to the anterior lower dental region with there is significant enamel erosion, necrotic changes.  There is no local gingival induration or edema.  No gingival abscess.  No indication for incision and drainage.  No submandibular fullness or induration or tenderness.  No sign of otitis media, bacterial sinusitis, retropharyngeal abscess, epiglottitis, peritonsillar abscess,  soft tissue infection of neck, Ludwig's angina, or pneumonia.  Cannot exclude pulp space infection.  Patient prescribed antibiotics, 10 day course of penicillin.  The patient was offered Magic mouthwash for analgesia but notes that he has plenty of this at home.  The patient has a scheduled appointment with his dentist within 7 days from now. Provided a singular dose of oxycodone 5mg  to take this evening when he returns home from the emergency department to help bridge pain relief as penicillin takes affect.  He will continue Tylenol.  Instructed to return if trouble swallowing, trouble breathing, or unresolved symptoms  after antibiotic course.  He will follow up with dentist.  Discharged at this time in no acute distress.         Procedures      NIH Stroke Scale

## 2019-03-10 ENCOUNTER — Inpatient Hospital Stay
Admit: 2019-03-10 | Discharge: 2019-03-10 | Disposition: A | Discharge status: Home / Self Care | Payer: PRIVATE HEALTH INSURANCE | Attending: Physician Assistant

## 2019-03-10 DIAGNOSIS — K0889 Other specified disorders of teeth and supporting structures: Secondary | ICD-10-CM

## 2019-03-10 LAB — POCT GLUCOSE: POC Glucose: 116 mg/dL — ABNORMAL HIGH (ref 70–105)

## 2019-03-10 LAB — GLUCOSE, POC: Glucose (POC): 116 mg/dL — ABNORMAL HIGH (ref 70–105)

## 2019-03-10 NOTE — ED Notes (Signed)
This nurse assume care of pt. Pt c/o front lower tooth pain for couple days now. Pt is alert and oriented.

## 2019-03-10 NOTE — ED Notes (Signed)
Patient here with concerns of dental pain that started a few days ago and blood sugar concern, blood sugar was 116 in triage.

## 2019-03-10 NOTE — ED Provider Notes (Signed)
43 year old male presents with a toothache.  He has a long history of frequent tooth aches.  He is from [place].  He states he is being seen and is scheduled to be seen by a dentist in [place] this Friday.  He was seen earlier this week and started on an antibiotic.  There has been no fever or flu symptoms.  No difficulty swallowing.  The patient is not a diabetic.  He does have a significant cardiac history.  No recent chest pains or difficulties breathing.    The history is provided by the patient.   Dental Pain    This is a recurrent problem. The problem occurs constantly. The problem has been gradually worsening.  The pain is moderate. There was no vomiting, no nausea, no fever, no swelling, no chest pain, no shortness of breath and no headaches.   Other          Past Medical History:   Diagnosis Date   ??? CAD (coronary artery disease)     MI x 3   ??? Cardiac revascularization with aortocoronary bypass anastomosis    ??? Chronic dental pain     - multiple teeth remove   ??? Chronic knee pain    ??? Hypertension    ??? Myocardial infarct [facility])    ??? S/P CABG x 1 2007    CABG x 1, 2007, due to congenital heart disease--patient's explanation is that right coronary artery was never in the proper location (perfused left side of heart only) and he had repeat cabage surgery in 2012 to correct this.   ??? Thromboembolus [facility])        Past Surgical History:   Procedure Laterality Date   ??? HX CHOLECYSTECTOMY  08/2010        ??? HX CORONARY ARTERY BYPASS GRAFT  2007, 2012    2007: due to congenital heart disease - patients explanation is that right coronary artery was never in the proper location (perfused left side of heart only) and had repeat CABG surgery in 2012 to correct this   ??? HX OTHER SURGICAL  11/30/2013    TOOTH EXTRACTION         Family History:   Problem Relation Age of Onset   ??? Heart Disease Mother    ??? Cancer Mother         BRAIN CANCER   ??? Diabetes Father    ??? Diabetes Brother    ??? Heart Disease Maternal Grandmother    ???  Dementia Maternal Grandmother    ??? Heart Disease Maternal Grandfather    ??? Heart Disease Paternal Grandmother    ??? Heart Disease Paternal Grandfather    ??? No Known Problems Other    ??? Heart Disease Brother        Social History     Socioeconomic History   ??? Marital status: SINGLE     Spouse name: Not on file   ??? Number of children: 0   ??? Years of education: Not on file   ??? Highest education level: Not on file   Occupational History     Comment: DISABILITY   Social Needs   ??? Financial resource strain: Not on file   ??? Food insecurity     Worry: Not on file     Inability: Not on file   ??? Transportation needs     Medical: Not on file     Non-medical: Not on file   Tobacco Use   ??? Smoking status:  Current Some Day Smoker     Packs/day: 0.25     Years: 30.00     Pack years: 7.50     Types: Cigarettes   ??? Smokeless tobacco: Former NeurosurgeonUser   ??? Tobacco comment: About 40-50 pack years, used to smoke 2 PPD but now down to 2 cigarettes a day   Substance and Sexual Activity   ??? Alcohol use: No   ??? Drug use: No   ??? Sexual activity: Not on file   Lifestyle   ??? Physical activity     Days per week: Not on file     Minutes per session: Not on file   ??? Stress: Not on file   Relationships   ??? Social Wellsite geologistconnections     Talks on phone: Not on file     Gets together: Not on file     Attends religious service: Not on file     Active member of club or organization: Not on file     Attends meetings of clubs or organizations: Not on file     Relationship status: Not on file   ??? Intimate partner violence     Fear of current or ex partner: Not on file     Emotionally abused: Not on file     Physically abused: Not on file     Forced sexual activity: Not on file   Other Topics Concern   ??? Military Service Not Asked   ??? Blood Transfusions Not Asked   ??? Caffeine Concern Not Asked   ??? Occupational Exposure Not Asked   ??? Hobby Hazards Not Asked   ??? Sleep Concern Not Asked   ??? Stress Concern Not Asked   ??? Weight Concern Not Asked   ??? Special Diet Not Asked    ??? Back Care Not Asked   ??? Exercise Not Asked   ??? Bike Helmet Not Asked   ??? Seat Belt Not Asked   ??? Self-Exams Not Asked   Social History Narrative    He is from [place] but moved to [place] at age 43.  Has been between [place] and [address] [place]    Lives with wife, married since 2016     No children    Disability secondary to heart disease and syncopal episode    About 40-50 pack years, used to smoke 2 PPD but now down to 2 cigarettes a day    No EtOH    No drug use         ALLERGIES: Coconut; Codeine; Morphine; Motrin [ibuprofen]; Toradol [ketorolac]; and Tramadol    Review of Systems   HENT: Positive for dental problem.    All other systems reviewed and are negative.      Vitals:    03/10/19 1757   BP: (!) 172/85   Pulse: 66   Resp: 18   Temp: 98.4 ??F (36.9 ??C)   SpO2: 98%   Weight: 122.5 kg (270 lb)   Height: 6\' 4"  (1.93 m)            Physical Exam  Vitals signs and nursing note reviewed.   Constitutional:       General: He is not in acute distress.     Appearance: Normal appearance. He is well-developed. He is not ill-appearing, toxic-appearing or diaphoretic.   HENT:      Head: Normocephalic.      Comments: Very poor dentition.  The patient's chief complaint today, dental pain, emanating from the front anterior lower  the T, a teeth #'s 24, 25, 26. There is no facial swelling.  No trismus.  No difficulty with secretions.  No adenopathy.  Dentition is rather poor with few remaining teeth, and all of these teeth in significant disrepair.     Right Ear: Tympanic membrane and external ear normal.      Left Ear: Tympanic membrane and external ear normal.      Nose: Nose normal. No congestion or rhinorrhea.      Mouth/Throat:      Mouth: Mucous membranes are moist.      Pharynx: Oropharynx is clear. No oropharyngeal exudate.   Eyes:      Extraocular Movements: Extraocular movements intact.      Pupils: Pupils are equal, round, and reactive to light.   Neck:      Musculoskeletal: Normal range of motion.    Cardiovascular:      Rate and Rhythm: Normal rate and regular rhythm.      Heart sounds: Normal heart sounds. No murmur.   Pulmonary:      Effort: Pulmonary effort is normal.      Breath sounds: Normal breath sounds.   Musculoskeletal: Normal range of motion.         General: No tenderness or deformity.   Lymphadenopathy:      Cervical: No cervical adenopathy.   Skin:     General: Skin is warm and dry.      Coloration: Skin is not pale.      Findings: No rash.   Neurological:      General: No focal deficit present.      Mental Status: He is alert and oriented to person, place, and time.      Deep Tendon Reflexes: Reflexes are normal and symmetric.   Psychiatric:         Mood and Affect: Mood normal.         Thought Content: Thought content normal.          MDM  Number of Diagnoses or Management Options  Diagnosis management comments: 43 year old male presents with dental pain.  The patient alleges to have a dental appointment this week in a [place].  He does not live in [place], home address is in [place].  He has a list of allergies that excludes all pain medicines except narcotics.  He ledge is that he is already on an antibiotic.  On exam there is significant dental disrepair decay but no evidence for acute infection, or need for intervention.  The patient is advised to continue Tylenol or Motrin for the pain, take his antibiotic as prescribed, follow up with his dentist sooner, seek emergency dental treatment at [facility] in [place].         Procedures      NIH Stroke Scale

## 2019-03-10 NOTE — ED Notes (Signed)
Patient left prior to receiving paperwork.

## 2019-03-10 NOTE — ED Provider Notes (Addendum)
43 year old male presents with a toothache.  He has a long history of frequent tooth aches.  He is from [place].  He states he is being seen and is scheduled to be seen by a dentist in [place] this Friday.  He was seen earlier this week and started on an antibiotic.  There has been no fever or flu symptoms.  No difficulty swallowing.  The patient is not a diabetic.  He does have a significant cardiac history.  No recent chest pains or difficulties breathing.    The history is provided by the patient.   Dental Pain    This is a recurrent problem. The problem occurs constantly. The problem has been gradually worsening.  The pain is moderate. There was no vomiting, no nausea, no fever, no swelling, no chest pain, no shortness of breath and no headaches.   Other          Past Medical History:   Diagnosis Date   ??? CAD (coronary artery disease)     MI x 3   ??? Cardiac revascularization with aortocoronary bypass anastomosis    ??? Chronic dental pain     - multiple teeth remove   ??? Chronic knee pain    ??? Hypertension    ??? Myocardial infarct [facility])    ??? S/P CABG x 1 2007    CABG x 1, 2007, due to congenital heart disease--patient's explanation is that right coronary artery was never in the proper location (perfused left side of heart only) and he had repeat cabage surgery in 2012 to correct this.   ??? Thromboembolus [facility](HCC)        Past Surgical History:   Procedure Laterality Date   ??? HX CHOLECYSTECTOMY  08/2010        ??? HX CORONARY ARTERY BYPASS GRAFT  2007, 2012    2007: due to congenital heart disease - patients explanation is that right coronary artery was never in the proper location (perfused left side of heart only) and had repeat CABG surgery in 2012 to correct this   ??? HX OTHER SURGICAL  11/30/2013    TOOTH EXTRACTION         Family History:   Problem Relation Age of Onset   ??? Heart Disease Mother    ??? Cancer Mother         BRAIN CANCER   ??? Diabetes Father    ??? Diabetes Brother    ??? Heart Disease Maternal Grandmother     ??? Dementia Maternal Grandmother    ??? Heart Disease Maternal Grandfather    ??? Heart Disease Paternal Grandmother    ??? Heart Disease Paternal Grandfather    ??? No Known Problems Other    ??? Heart Disease Brother        Social History     Socioeconomic History   ??? Marital status: SINGLE     Spouse name: Not on file   ??? Number of children: 0   ??? Years of education: Not on file   ??? Highest education level: Not on file   Occupational History     Comment: DISABILITY   Social Needs   ??? Financial resource strain: Not on file   ??? Food insecurity     Worry: Not on file     Inability: Not on file   ??? Transportation needs     Medical: Not on file     Non-medical: Not on file   Tobacco Use   ??? Smoking status:  Current Some Day Smoker     Packs/day: 0.25     Years: 30.00     Pack years: 7.50     Types: Cigarettes   ??? Smokeless tobacco: Former Systems developer   ??? Tobacco comment: About 40-50 pack years, used to smoke 2 PPD but now down to 2 cigarettes a day   Substance and Sexual Activity   ??? Alcohol use: No   ??? Drug use: No   ??? Sexual activity: Not on file   Lifestyle   ??? Physical activity     Days per week: Not on file     Minutes per session: Not on file   ??? Stress: Not on file   Relationships   ??? Social Product manager on phone: Not on file     Gets together: Not on file     Attends religious service: Not on file     Active member of club or organization: Not on file     Attends meetings of clubs or organizations: Not on file     Relationship status: Not on file   ??? Intimate partner violence     Fear of current or ex partner: Not on file     Emotionally abused: Not on file     Physically abused: Not on file     Forced sexual activity: Not on file   Other Topics Concern   ??? Military Service Not Asked   ??? Blood Transfusions Not Asked   ??? Caffeine Concern Not Asked   ??? Occupational Exposure Not Asked   ??? Hobby Hazards Not Asked   ??? Sleep Concern Not Asked   ??? Stress Concern Not Asked   ??? Weight Concern Not Asked    ??? Special Diet Not Asked   ??? Back Care Not Asked   ??? Exercise Not Asked   ??? Bike Helmet Not Asked   ??? Seat Belt Not Asked   ??? Self-Exams Not Asked   Social History Narrative    He is from [place] but moved to [place] at age 35.  Has been between [place] and [place] with wife, married since 2016     No children    Disability secondary to heart disease and syncopal episode    About 40-50 pack years, used to smoke 2 PPD but now down to 2 cigarettes a day    No EtOH    No drug use         ALLERGIES: Coconut; Codeine; Morphine; Motrin [ibuprofen]; Toradol [ketorolac]; and Tramadol    Review of Systems   HENT: Positive for dental problem.    All other systems reviewed and are negative.      Vitals:    03/10/19 1757   BP: (!) 172/85   Pulse: 66   Resp: 18   Temp: 98.4 ??F (36.9 ??C)   SpO2: 98%   Weight: 122.5 kg (270 lb)   Height: 6\' 4"  (1.93 m)            Physical Exam  Vitals signs and nursing note reviewed.   Constitutional:       General: He is not in acute distress.     Appearance: Normal appearance. He is well-developed. He is not ill-appearing, toxic-appearing or diaphoretic.   HENT:      Head: Normocephalic.      Comments: Very poor dentition.  The patient's chief complaint today, dental pain, emanating from the front anterior lower  the T, a teeth #'s 24, 25, 26. There is no facial swelling.  No trismus.  No difficulty with secretions.  No adenopathy.  Dentition is rather poor with few remaining teeth, and all of these teeth in significant disrepair.     Right Ear: Tympanic membrane and external ear normal.      Left Ear: Tympanic membrane and external ear normal.      Nose: Nose normal. No congestion or rhinorrhea.      Mouth/Throat:      Mouth: Mucous membranes are moist.      Pharynx: Oropharynx is clear. No oropharyngeal exudate.   Eyes:      Extraocular Movements: Extraocular movements intact.      Pupils: Pupils are equal, round, and reactive to light.   Neck:       Musculoskeletal: Normal range of motion.   Cardiovascular:      Rate and Rhythm: Normal rate and regular rhythm.      Heart sounds: Normal heart sounds. No murmur.   Pulmonary:      Effort: Pulmonary effort is normal.      Breath sounds: Normal breath sounds.   Musculoskeletal: Normal range of motion.         General: No tenderness or deformity.   Lymphadenopathy:      Cervical: No cervical adenopathy.   Skin:     General: Skin is warm and dry.      Coloration: Skin is not pale.      Findings: No rash.   Neurological:      General: No focal deficit present.      Mental Status: He is alert and oriented to person, place, and time.      Deep Tendon Reflexes: Reflexes are normal and symmetric.   Psychiatric:         Mood and Affect: Mood normal.         Thought Content: Thought content normal.          MDM  Number of Diagnoses or Management Options  Diagnosis management comments: 43 year old male presents with dental pain.  The patient alleges to have a dental appointment this week in a [place].  He does not live in [place], home address is in [place].  He has a list of allergies that excludes all pain medicines except narcotics.  He ledge is that he is already on an antibiotic.  On exam there is significant dental disrepair decay but no evidence for acute infection, or need for intervention.  The patient is advised to continue Tylenol or Motrin for the pain, take his antibiotic as prescribed, follow up with his dentist sooner, seek emergency dental treatment at [facility] in [place].         Procedures      NIH Stroke Scale

## 2019-03-10 NOTE — ED Notes (Signed)
This nurse assume care of pt. Pt c/o front lower tooth pain for couple days now. Pt is alert and oriented.

## 2019-03-10 NOTE — ED Notes (Signed)
Patient left prior to receiving paperwork

## 2019-03-10 NOTE — ED Triage Notes (Signed)
Patient here with concerns of dental pain that started a few days ago and blood sugar concern, blood sugar was 116 in triage.

## 2019-03-18 ENCOUNTER — Emergency Department
Admit: 2019-03-18 | Disposition: A | Discharge status: Home / Self Care | Source: Home / Self Care | Attending: Nurse Practitioner | Admitting: Nurse Practitioner

## 2019-04-03 ENCOUNTER — Emergency Department: Payer: PRIVATE HEALTH INSURANCE | Primary: Family Medicine

## 2019-04-03 ENCOUNTER — Emergency Department: Admit: 2019-04-04 | Payer: PRIVATE HEALTH INSURANCE | Primary: Family Medicine

## 2019-04-03 ENCOUNTER — Inpatient Hospital Stay: Admit: 2019-04-03 | Discharge: 2019-04-04 | Payer: PRIVATE HEALTH INSURANCE | Attending: Emergency Medicine

## 2019-04-03 DIAGNOSIS — I209 Angina pectoris, unspecified: Secondary | ICD-10-CM

## 2019-04-03 MED ORDER — SODIUM CHLORIDE 0.9 % IJ SYRG
Freq: Three times a day (TID) | INTRAMUSCULAR | Status: DC
Start: 2019-04-03 — End: 2019-04-03

## 2019-04-03 MED ORDER — SODIUM CHLORIDE 0.9 % IJ SYRG
INTRAMUSCULAR | Status: DC | PRN
Start: 2019-04-03 — End: 2019-04-03

## 2019-04-03 MED FILL — NORMAL SALINE FLUSH 0.9 % INJECTION SYRINGE: INTRAMUSCULAR | Qty: 10

## 2019-04-03 NOTE — ED Notes (Signed)
The patient rang, he now states that we are not doing anything for him, he aggressively rose from the stretcher and walked out the door, stating he would go to [place] for medication and help

## 2019-04-03 NOTE — ED Notes (Signed)
Patient here with complaint of chest pain. HX of 3 MI he states.

## 2019-04-03 NOTE — ED Provider Notes (Signed)
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  The patient states his pain is 8/10.  He ran out of aspirin yesterday.  He continues to smoke.  He used to smoke 2 packs a day any now smokes just 2 cigarettes a day.  He denies alcohol or recreational drug use.  The patient states his cardiologist does not want taking nitroglycerin as this has led to hypotensive syncopal episodes.  The patient was evaluated on the 9th of this month at the [facility] emergency room for chest pain that he states was a little bit different.  The patient has been admitted here for chest pain several months ago and signed out against medical advice.  The patient is without COVID-19 type symptoms.  He denies shortness of breath or wheezing.  He is without black or bloody stools or odynophagia.  He has been compliant with all his other medicines besides the aspirin.           Past Medical History:   Diagnosis Date   ??? CAD (coronary artery disease)     MI x 3   ??? Cardiac revascularization with aortocoronary bypass anastomosis    ??? Chronic dental pain     - multiple teeth remove   ??? Chronic knee pain    ??? Hypertension    ??? Myocardial infarct [facility])    ??? S/P CABG x 1 2007    CABG x 1, 2007, due to congenital heart disease--patient's explanation is that right coronary artery was never in the proper location (perfused left side of heart only) and he had repeat cabage surgery in 2012 to correct this.   ??? Thromboembolus [facility])        Past Surgical History:   Procedure Laterality Date   ??? HX CHOLECYSTECTOMY  08/2010         ??? HX CORONARY ARTERY BYPASS GRAFT  2007, 2012    2007: due to congenital heart disease - patients explanation is that right coronary artery was never in the proper location (perfused left side of heart only) and had repeat CABG surgery in 2012 to correct this   ??? HX OTHER SURGICAL  11/30/2013    TOOTH EXTRACTION         Family History:   Problem Relation Age of Onset   ??? Heart Disease Mother    ??? Cancer Mother         BRAIN CANCER   ??? Diabetes Father    ??? Diabetes Brother    ??? Heart Disease Maternal Grandmother    ??? Dementia Maternal Grandmother    ??? Heart Disease Maternal Grandfather    ??? Heart Disease Paternal Grandmother    ??? Heart Disease Paternal Grandfather    ??? No Known Problems Other    ??? Heart Disease Brother        Social History     Socioeconomic History   ??? Marital status: SINGLE     Spouse name: Not on file   ??? Number of children: 0   ??? Years of education: Not on file   ???  Highest education level: Not on file   Occupational History     Comment: DISABILITY   Social Needs   ??? Financial resource strain: Not on file   ??? Food insecurity     Worry: Not on file     Inability: Not on file   ??? Transportation needs     Medical: Not on file     Non-medical: Not on file   Tobacco Use   ??? Smoking status: Current Some Day Smoker     Packs/day: 0.25     Years: 30.00     Pack years: 7.50     Types: Cigarettes   ??? Smokeless tobacco: Former NeurosurgeonUser   ??? Tobacco comment: About 40-50 pack years, used to smoke 2 PPD but now down to 2 cigarettes a day   Substance and Sexual Activity   ??? Alcohol use: No   ??? Drug use: No   ??? Sexual activity: Not on file   Lifestyle   ??? Physical activity     Days per week: Not on file     Minutes per session: Not on file   ??? Stress: Not on file   Relationships   ??? Social Wellsite geologistconnections     Talks on phone: Not on file     Gets together: Not on file     Attends religious service: Not on file     Active member of club or organization: Not on file     Attends meetings of clubs or organizations: Not  on file     Relationship status: Not on file   ??? Intimate partner violence     Fear of current or ex partner: Not on file     Emotionally abused: Not on file     Physically abused: Not on file     Forced sexual activity: Not on file   Other Topics Concern   ??? Military Service Not Asked   ??? Blood Transfusions Not Asked   ??? Caffeine Concern Not Asked   ??? Occupational Exposure Not Asked   ??? Hobby Hazards Not Asked   ??? Sleep Concern Not Asked   ??? Stress Concern Not Asked   ??? Weight Concern Not Asked   ??? Special Diet Not Asked   ??? Back Care Not Asked   ??? Exercise Not Asked   ??? Bike Helmet Not Asked   ??? Seat Belt Not Asked   ??? Self-Exams Not Asked   Social History Narrative    He is from [place] but moved to [place] at age 43.  Has been between [place] and [address] [place]    Lives with wife, married since 2016     No children    Disability secondary to heart disease and syncopal episode    About 40-50 pack years, used to smoke 2 PPD but now down to 2 cigarettes a day    No EtOH    No drug use         ALLERGIES: Coconut; Codeine; Morphine; Motrin [ibuprofen]; Toradol [ketorolac]; and Tramadol    Review of Systems   Constitutional: Negative for appetite change, chills, fatigue and fever.   HENT: Negative.    Eyes: Negative.    Respiratory: Positive for cough. Negative for shortness of breath and wheezing.         The patient has a chronic smoker's cough.   Cardiovascular: Positive for chest pain. Negative for palpitations and leg swelling.   Gastrointestinal: Negative for abdominal pain, diarrhea, nausea and  vomiting.   Endocrine: Negative.    Genitourinary: Negative.    Musculoskeletal: Negative for arthralgias and back pain.   Skin: Negative.    Allergic/Immunologic: Negative.    Neurological: Negative.    Hematological: Negative.    Psychiatric/Behavioral: Negative.        Vitals:    04/03/19 1858   BP: 127/73   Pulse: 88   Resp: 28   Temp: 98.5 ??F (36.9 ??C)   SpO2: 97%   Weight: 124.3 kg (274 lb)   Height: 6\' 4"  (1.93 m)             Physical Exam  Vitals signs and nursing note reviewed.   Constitutional:       General: He is not in acute distress.     Appearance: He is well-developed. He is obese. He is not ill-appearing, toxic-appearing or diaphoretic.   HENT:      Head: Normocephalic and atraumatic.      Nose: Nose normal.      Mouth/Throat:      Pharynx: Oropharynx is clear.   Eyes:      General: No scleral icterus.     Extraocular Movements: Extraocular movements intact.      Conjunctiva/sclera: Conjunctivae normal.      Pupils: Pupils are equal, round, and reactive to light.   Neck:      Musculoskeletal: Normal range of motion and neck supple.   Cardiovascular:      Rate and Rhythm: Normal rate and regular rhythm.      Pulses: Normal pulses.      Heart sounds: Normal heart sounds. No murmur.   Pulmonary:      Effort: Pulmonary effort is normal.      Breath sounds: Normal breath sounds. No wheezing or rales.   Abdominal:      General: Bowel sounds are normal.      Palpations: Abdomen is soft.      Tenderness: There is no abdominal tenderness. There is no right CVA tenderness, guarding or rebound.   Musculoskeletal: Normal range of motion.         General: No swelling, tenderness, deformity or signs of injury.      Right lower leg: No edema.      Left lower leg: No edema.   Lymphadenopathy:      Cervical: No cervical adenopathy.   Skin:     General: Skin is warm.      Capillary Refill: Capillary refill takes less than 2 seconds.      Findings: No rash.   Neurological:      General: No focal deficit present.      Mental Status: He is alert and oriented to person, place, and time.   Psychiatric:         Mood and Affect: Mood normal.         Behavior: Behavior normal.         Thought Content: Thought content normal.         Judgment: Judgment normal.          MDM  Number of Diagnoses or Management Options  Diagnosis management comments: This 43 year old obese black gentleman with a history of tobacco abuse, hypertension, dyslipidemia,  poorly controlled diabetes, coronary artery disease with 3 myocardial infarctions, angioplasty with stent placement and a cholecystectomy presents to the emergency room stating while sitting in a cab he developed left left pectoral chest pain/achiness that radiated into the left shoulder and arm as well as in  the left side of his neck and jaw aching a little nauseous and clammy.  The the pain began about 15 minutes prior to arrival to the emergency room department.  He ran out of his aspirin yesterday and has not had any today.  He typically does not take nitroglycerin as he has had hypotensive associated syncopal episodes while taking the nitroglycerin in the past.  The patient cannot reproduce his pain.  The he is without any fevers, coughing, wheezing or shortness of breath.  He denies any leg pain or swelling.  His EKG she she failed to show any acute findings.  He has a prominent Q-wave in lead 3 any has some delayed R-wave progression in the precordial leads.  The QTC is 413. The patient will be given 4 baby aspirin and a sublingual nitroglycerin.  Routine labs and a chest x-ray will be performed.  The patient has been encouraged to quit smoking.    Upon re-evaluation the patient had eloped from the emergency room department when he had been told by nursing that no opiates rim be given for his chest pain until the workup had been completed.  The patient had signed out against medical advice this past August when admitted here for chest pain workup for the same reason.  The patient's CBC and troponin I levels were unremarkable.  Unfortunately the patient has a significant past medical history for premature coronary artery disease with myocardial infarctions and a CABG.  He continues to smoke.  He has morbid obesity.  The patient also had not taken his aspirin today.  The patient by history has worrisome symptoms for angina.  Nevertheless he will eloped from the emergency room department is prior to trying to  convince him stools remain in the department.       Amount and/or Complexity of Data Reviewed  Clinical lab tests: reviewed  Tests in the radiology section of CPT??: reviewed           Procedures      NIH Stroke Scale              ICD-10-CM ICD-9-CM    1. Angina pectoris (HCC)  I20.9 413.9    2. Recurrent chest pain  R07.9 786.50    3. History of coronary artery stent placement  Z95.5 V45.82    4. Narcotic drug use  F11.90 305.50    5. Obesity (BMI 30-39.9)  E66.9 278.00    6. Tobacco dependence syndrome  F17.200 305.1    7. Status post coronary artery bypass graft  Z95.1 V45.81

## 2019-04-03 NOTE — ED Notes (Signed)
The patient has refused the nitro, stating it will not help and states that he was no more attempts at an IV as he feels there is no need for it, because we will not use it, provider is aware

## 2019-04-03 NOTE — ED Provider Notes (Addendum)
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  The patient states his pain is 8/10.  He ran out of aspirin yesterday.  He continues to smoke.  He used to smoke 2 packs a day any now smokes just 2 cigarettes a day.  He denies alcohol or recreational drug use.  The patient states his cardiologist does not want taking nitroglycerin as this has led to hypotensive syncopal episodes.  The patient was evaluated on the 9th of this month at the [facility] emergency room for chest pain that he states was a little bit different.  The patient has been admitted here for chest pain several months ago and signed out against medical advice.  The patient is without COVID-19 type symptoms.  He denies shortness of breath or wheezing.  He is without black or bloody stools or odynophagia.  He has been compliant with all his other medicines besides the aspirin.           Past Medical History:   Diagnosis Date   ??? CAD (coronary artery disease)     MI x 3   ??? Cardiac revascularization with aortocoronary bypass anastomosis    ??? Chronic dental pain     - multiple teeth remove   ??? Chronic knee pain    ??? Hypertension    ??? Myocardial infarct [facility])    ??? S/P CABG x 1 2007    CABG x 1, 2007, due to congenital heart disease--patient's explanation is that right coronary artery was never in the proper location (perfused left side of heart only) and he had repeat cabage surgery in 2012 to correct this.   ??? Thromboembolus [facility])        Past Surgical History:   Procedure Laterality Date   ??? HX CHOLECYSTECTOMY  08/2010         ??? HX CORONARY ARTERY BYPASS GRAFT  2007, 2012    2007: due to congenital heart disease - patients explanation is that right coronary artery was never in the proper location (perfused left side of heart only) and had repeat CABG surgery in 2012 to correct this   ??? HX OTHER SURGICAL  11/30/2013    TOOTH EXTRACTION         Family History:   Problem Relation Age of Onset   ??? Heart Disease Mother    ??? Cancer Mother         BRAIN CANCER   ??? Diabetes Father    ??? Diabetes Brother    ??? Heart Disease Maternal Grandmother    ??? Dementia Maternal Grandmother    ??? Heart Disease Maternal Grandfather    ??? Heart Disease Paternal Grandmother    ??? Heart Disease Paternal Grandfather    ??? No Known Problems Other    ??? Heart Disease Brother        Social History     Socioeconomic History   ??? Marital status: SINGLE     Spouse name: Not on file   ??? Number of children: 0   ??? Years of education: Not on file   ???  Highest education level: Not on file   Occupational History     Comment: DISABILITY   Social Needs   ??? Financial resource strain: Not on file   ??? Food insecurity     Worry: Not on file     Inability: Not on file   ??? Transportation needs     Medical: Not on file     Non-medical: Not on file   Tobacco Use   ??? Smoking status: Current Some Day Smoker     Packs/day: 0.25     Years: 30.00     Pack years: 7.50     Types: Cigarettes   ??? Smokeless tobacco: Former Neurosurgeon   ??? Tobacco comment: About 40-50 pack years, used to smoke 2 PPD but now down to 2 cigarettes a day   Substance and Sexual Activity   ??? Alcohol use: No   ??? Drug use: No   ??? Sexual activity: Not on file   Lifestyle   ??? Physical activity     Days per week: Not on file     Minutes per session: Not on file   ??? Stress: Not on file   Relationships   ??? Social Wellsite geologist on phone: Not on file     Gets together: Not on file     Attends religious service: Not on file     Active member of club or organization: Not on file      Attends meetings of clubs or organizations: Not on file     Relationship status: Not on file   ??? Intimate partner violence     Fear of current or ex partner: Not on file     Emotionally abused: Not on file     Physically abused: Not on file     Forced sexual activity: Not on file   Other Topics Concern   ??? Military Service Not Asked   ??? Blood Transfusions Not Asked   ??? Caffeine Concern Not Asked   ??? Occupational Exposure Not Asked   ??? Hobby Hazards Not Asked   ??? Sleep Concern Not Asked   ??? Stress Concern Not Asked   ??? Weight Concern Not Asked   ??? Special Diet Not Asked   ??? Back Care Not Asked   ??? Exercise Not Asked   ??? Bike Helmet Not Asked   ??? Seat Belt Not Asked   ??? Self-Exams Not Asked   Social History Narrative    He is from [place] but moved to [place] at age 41.  Has been between [place] and [address]    Lives with wife, married since 2016     No children    Disability secondary to heart disease and syncopal episode    About 40-50 pack years, used to smoke 2 PPD but now down to 2 cigarettes a day    No EtOH    No drug use         ALLERGIES: Coconut; Codeine; Morphine; Motrin [ibuprofen]; Toradol [ketorolac]; and Tramadol    Review of Systems   Constitutional: Negative for appetite change, chills, fatigue and fever.   HENT: Negative.    Eyes: Negative.    Respiratory: Positive for cough. Negative for shortness of breath and wheezing.         The patient has a chronic smoker's cough.   Cardiovascular: Positive for chest pain. Negative for palpitations and leg swelling.   Gastrointestinal: Negative for abdominal pain, diarrhea, nausea and  vomiting.   Endocrine: Negative.    Genitourinary: Negative.    Musculoskeletal: Negative for arthralgias and back pain.   Skin: Negative.    Allergic/Immunologic: Negative.    Neurological: Negative.    Hematological: Negative.    Psychiatric/Behavioral: Negative.        Vitals:    04/03/19 1858   BP: 127/73   Pulse: 88   Resp: 28   Temp: 98.5 ??F (36.9 ??C)   SpO2: 97%    Weight: 124.3 kg (274 lb)   Height:  (1.93 m)            Physical Exam  Vitals signs and nursing note reviewed.   Constitutional:       General: He is not in acute distress.     Appearance: He is well-developed. He is obese. He is not ill-appearing, toxic-appearing or diaphoretic.   HENT:      Head: Normocephalic and atraumatic.      Nose: Nose normal.      Mouth/Throat:      Pharynx: Oropharynx is clear.   Eyes:      General: No scleral icterus.     Extraocular Movements: Extraocular movements intact.      Conjunctiva/sclera: Conjunctivae normal.      Pupils: Pupils are equal, round, and reactive to light.   Neck:      Musculoskeletal: Normal range of motion and neck supple.   Cardiovascular:      Rate and Rhythm: Normal rate and regular rhythm.      Pulses: Normal pulses.      Heart sounds: Normal heart sounds. No murmur.   Pulmonary:      Effort: Pulmonary effort is normal.      Breath sounds: Normal breath sounds. No wheezing or rales.   Abdominal:      General: Bowel sounds are normal.      Palpations: Abdomen is soft.      Tenderness: There is no abdominal tenderness. There is no right CVA tenderness, guarding or rebound.   Musculoskeletal: Normal range of motion.         General: No swelling, tenderness, deformity or signs of injury.      Right lower leg: No edema.      Left lower leg: No edema.   Lymphadenopathy:      Cervical: No cervical adenopathy.   Skin:     General: Skin is warm.      Capillary Refill: Capillary refill takes less than 2 seconds.      Findings: No rash.   Neurological:      General: No focal deficit present.      Mental Status: He is alert and oriented to person, place, and time.   Psychiatric:         Mood and Affect: Mood normal.         Behavior: Behavior normal.         Thought Content: Thought content normal.         Judgment: Judgment normal.          MDM  Number of Diagnoses or Management Options  Diagnosis management comments: This 43 year old obese black gentleman with  a history of tobacco abuse, hypertension, dyslipidemia, poorly controlled diabetes, coronary artery disease with 3 myocardial infarctions, angioplasty with stent placement and a cholecystectomy presents to the emergency room stating while sitting in a cab he developed left left pectoral chest pain/achiness that radiated into the left shoulder and arm as well as in  the left side of his neck and jaw aching a little nauseous and clammy.  The the pain began about 15 minutes prior to arrival to the emergency room department.  He ran out of his aspirin yesterday and has not had any today.  He typically does not take nitroglycerin as he has had hypotensive associated syncopal episodes while taking the nitroglycerin in the past.  The patient cannot reproduce his pain.  The he is without any fevers, coughing, wheezing or shortness of breath.  He denies any leg pain or swelling.  His EKG she she failed to show any acute findings.  He has a prominent Q-wave in lead 3 any has some delayed R-wave progression in the precordial leads.  The QTC is 413. The patient will be given 4 baby aspirin and a sublingual nitroglycerin.  Routine labs and a chest x-ray will be performed.  The patient has been encouraged to quit smoking.    Upon re-evaluation the patient had eloped from the emergency room department when he had been told by nursing that no opiates rim be given for his chest pain until the workup had been completed.  The patient had signed out against medical advice this past August when admitted here for chest pain workup for the same reason.  The patient's CBC and troponin I levels were unremarkable.  Unfortunately the patient has a significant past medical history for premature coronary artery disease with myocardial infarctions and a CABG.  He continues to smoke.  He has morbid obesity.  The patient also had not taken his aspirin today.  The patient by history  has worrisome symptoms for angina.  Nevertheless he will eloped from the emergency room department is prior to trying to convince him stools remain in the department.       Amount and/or Complexity of Data Reviewed  Clinical lab tests: reviewed  Tests in the radiology section of CPT??: reviewed           Procedures      NIH Stroke Scale              ICD-10-CM ICD-9-CM    1. Angina pectoris (HCC)  I20.9 413.9    2. Recurrent chest pain  R07.9 786.50    3. History of coronary artery stent placement  Z95.5 V45.82    4. Narcotic drug use  F11.90 305.50    5. Obesity (BMI 30-39.9)  E66.9 278.00    6. Tobacco dependence syndrome  F17.200 305.1    7. Status post coronary artery bypass graft  Z95.1 V45.81

## 2019-04-03 NOTE — ED Notes (Signed)
The patient has refused the nitro, stating it will not help and states that he was no more attempts at an IV as he feels there is no need for it, because we will not use it, provider is aware

## 2019-04-03 NOTE — ED Notes (Signed)
The patient rang, he now states that we are not doing anything for him, he aggressively rose from the stretcher and walked out the door, stating he would go to [place] for medication and help

## 2019-04-03 NOTE — ED Triage Notes (Signed)
Patient here with complaint of chest pain. HX of 3 MI he states.

## 2019-04-04 LAB — COMPREHENSIVE METABOLIC PANEL
ALT: 68 U/L — ABNORMAL HIGH (ref 16–61)
AST: 59 U/L — ABNORMAL HIGH (ref 15–37)
Albumin/Globulin Ratio: 0.7 — ABNORMAL LOW (ref 1.0–3.0)
Albumin: 3.3 g/dL — ABNORMAL LOW (ref 3.4–5.0)
Alkaline Phosphatase: 59 U/L (ref 45–117)
Anion Gap: 5 mmol/L (ref 5–15)
BUN: 16 MG/DL (ref 7–18)
Bun/Cre Ratio: 14 NA (ref 12–20)
CO2: 26 mmol/L (ref 21–32)
Calcium: 9.4 MG/DL (ref 8.5–10.1)
Chloride: 108 mmol/L (ref 98–110)
Creatinine: 1.17 MG/DL (ref 0.70–1.30)
EGFR IF NonAfrican American: >60 mL/min/{1.73_m2} (ref >60)
GFR African American: >60 mL/min/{1.73_m2} (ref >60)
Glucose: 89 mg/dL (ref 70–100)
Potassium: 4.7 mmol/L (ref 3.5–5.1)
Sodium: 139 mmol/L (ref 136–145)
Total Bilirubin: 0.42 mg/dL (ref 0.00–1.20)
Total Protein: 8.3 g/dL — ABNORMAL HIGH (ref 6.4–8.2)

## 2019-04-04 LAB — TROPONIN: Troponin I: 0.018 ng/mL (ref 0.000–0.040)

## 2019-04-04 LAB — TROPONIN I: Troponin-I, Qt.: 0.018 ng/mL (ref 0.000–0.040)

## 2019-04-04 LAB — METABOLIC PANEL, COMPREHENSIVE
A-G Ratio: 0.7 — ABNORMAL LOW (ref 1.0–3.0)
ALT (SGPT): 68 U/L — ABNORMAL HIGH (ref 16–61)
AST (SGOT): 59 U/L — ABNORMAL HIGH (ref 15–37)
Albumin: 3.3 g/dL — ABNORMAL LOW (ref 3.4–5.0)
Alk. phosphatase: 59 U/L (ref 45–117)
Anion gap: 5 mmol/L (ref 5–15)
BUN/Creatinine ratio: 14 (ref 12–20)
BUN: 16 MG/DL (ref 7–18)
Bilirubin, total: 0.42 mg/dL (ref 0.00–1.20)
CO2: 26 mmol/L (ref 21–32)
Calcium: 9.4 MG/DL (ref 8.5–10.1)
Chloride: 108 mmol/L (ref 98–110)
Creatinine: 1.17 MG/DL (ref 0.70–1.30)
GFR est AA: >60 mL/min/{1.73_m2} (ref >60)
GFR est non-AA: >60 mL/min/{1.73_m2} (ref >60)
Glucose: 89 mg/dL (ref 70–100)
Potassium: 4.7 mmol/L (ref 3.5–5.1)
Protein, total: 8.3 g/dL — ABNORMAL HIGH (ref 6.4–8.2)
Sodium: 139 mmol/L (ref 136–145)

## 2019-04-04 MED ORDER — ASPIRIN 81 MG CHEWABLE TAB
81 mg | ORAL | Status: AC
Start: 2019-04-04 — End: 2019-04-03
  Administered 2019-04-04: 01:00:00 via ORAL

## 2019-04-04 MED ORDER — NITROGLYCERIN 0.4 MG SUBLINGUAL TAB
0.4 mg | SUBLINGUAL | Status: DC | PRN
Start: 2019-04-04 — End: 2019-04-03

## 2019-04-04 MED FILL — ASPIRIN 81 MG CHEWABLE TAB: 81 mg | ORAL | Qty: 4

## 2019-04-04 MED FILL — NITROGLYCERIN 0.4 MG SUBLINGUAL TAB: 0.4 mg | SUBLINGUAL | Qty: 25

## 2019-04-05 LAB — EKG 12-LEAD
Atrial Rate: 78 ms
ECG QTC Interval: 360 ms
EKG I-40 FRONT AXIS: -3 deg
EKG I-40 HORIZONTAL AXIS: 54 deg
EKG P DURATION: 127 ms
EKG P FRONT AXIS: 39 deg
EKG P HORIZONTAL AXIS: 0 deg
EKG Q ONSET: 504 ms
EKG QRS AXIS: 34 deg
EKG QRS HORIZONTAL AXIS: -58 deg
EKG QRSD INTERVAL: 97 ms
EKG QTCB: 413 ms
EKG QTCF: 395 ms
EKG RR INTERVAL: 759 ms
EKG S-T FRONT AXIS: 69 deg
EKG S-T HORIZONTAL AXIS: 69 deg
EKG T HORIZONTAL AXIS: 47 deg
EKG T WAVE AXIS: 56 deg
EKG T-40 FRONT AXIS: 121 deg
EKG T-40 HORIZONTAL AXIS: -72 deg
Heart Rate: 79 {beats}/min
P-R Interval: 168 ms

## 2019-04-05 LAB — EKG, 12 LEAD, INITIAL
Atrial Rate: 78 ms
Heart Rate: 79 {beats}/min
I-40 Front Axis: -3 deg
I-40 Horizontal Axis: 54 deg
P Duration: 127 ms
P Front Axis: 39 deg
P Horizontal Axis: 0 deg
P-R Interval: 168 ms
Q Onset: 504 ms
QRS Axis: 34 deg
QRS Horizontal Axis: -58 deg
QRSD Interval: 97 ms
QT Interval: 360 ms
QTcB: 413 ms
QTcF: 395 ms
RR Interval: 759 ms
S-T Front Axis: 69 deg
S-T Horizontal Axis: 69 deg
T Horizontal Axis: 47 deg
T Wave Axis: 56 deg
T-40 Front Axis: 121 deg
T-40 Horizontal Axis: -72 deg

## 2019-04-08 ENCOUNTER — Emergency Department
Admit: 2019-04-08 | Disposition: A | Discharge status: Home / Self Care | Source: Home / Self Care | Attending: Nurse Practitioner | Admitting: Nurse Practitioner

## 2019-04-08 NOTE — ED Notes (Signed)
Please click on link to see document

## 2019-05-04 LAB — LAB EXTERNAL RESULT UNMAPPED: COVID-19 CARE EVERYWHERE: NOT DETECTED

## 2019-05-05 ENCOUNTER — Ambulatory Visit: Admitting: Emergency Medicine

## 2019-05-05 ENCOUNTER — Emergency Department
Admit: 2019-05-05 | Disposition: A | Discharge status: Home / Self Care | Source: Home / Self Care | Attending: Emergency Medicine | Admitting: Emergency Medicine

## 2019-05-05 ENCOUNTER — Emergency Department
Admit: 2019-05-05 | Discharge: 2019-05-05 | Disposition: A | Discharge status: Home / Self Care | Payer: No Typology Code available for payment source

## 2019-05-05 LAB — HX HEM-ROUTINE
HX BASO #: 0 10*3/uL (ref 0.0–0.2)
HX BASO: 0 %
HX EOSIN #: 0.1 10*3/uL (ref 0.0–0.5)
HX EOSIN: 2 %
HX HCT: 40 % (ref 37.0–47.0)
HX HGB: 12.7 g/dL — ABNORMAL LOW (ref 13.5–16.0)
HX IMMATURE GRANULOCYTE#: 0 10*3/uL (ref 0.0–0.1)
HX IMMATURE GRANULOCYTE: 0 %
HX LYMPH #: 1.8 10*3/uL (ref 1.0–4.0)
HX LYMPH: 33 %
HX MCH: 29.3 pg (ref 26.0–34.0)
HX MCHC: 31.8 g/dL — ABNORMAL LOW (ref 32.0–36.0)
HX MCV: 92.2 fL (ref 80.0–98.0)
HX MONO #: 0.3 10*3/uL (ref 0.2–0.8)
HX MONO: 5 %
HX MPV: 10.3 fL (ref 9.1–11.7)
HX NEUT #: 3.2 10*3/uL (ref 1.5–7.5)
HX NRBC #: 0 10*3/uL
HX NUCLEATED RBC: 0 %
HX PLT: 226 10*3/uL (ref 150–400)
HX RBC BLOOD COUNT: 4.34 M/uL (ref 4.20–5.50)
HX RDW: 13.7 % (ref 11.5–14.5)
HX SEG NEUT: 59 %
HX WBC: 5.3 10*3/uL (ref 4.0–11.0)

## 2019-05-05 LAB — HX CHEM-PANELS
HX ANION GAP: 6 (ref 3–14)
HX BLOOD UREA NITROGEN: 12 mg/dL (ref 6–24)
HX CHLORIDE (CL): 104 meq/L (ref 98–110)
HX CO2: 29 meq/L (ref 20–30)
HX CREATININE (CR): 1.15 mg/dL (ref 0.57–1.30)
HX GFR, AFRICAN AMERICAN: 91 mL/min/{1.73_m2}
HX GFR, NON-AFRICAN AMERICAN: 78 mL/min/{1.73_m2}
HX GLUCOSE: 129 mg/dL (ref 70–139)
HX POTASSIUM (K): 4 meq/L (ref 3.6–5.1)
HX SODIUM (NA): 139 meq/L (ref 135–145)

## 2019-05-05 LAB — HX COAGULATION
HX INR PT: 1 (ref 0.9–1.3)
HX PROTHROMBIN TIME: 12 s (ref 9.7–14.0)
HX PTT: 28.8 s (ref 25.7–35.7)

## 2019-05-05 LAB — HX TOXICOLOGY-DRUG, SERUM
HX ACETAMINOPHEN: <3 ug/mL — ABNORMAL LOW (ref 10–20)
HX BARBITUATES QL, SERUM: NOT DETECTED
HX BENZODIAZEPINES QL, SERUM: NOT DETECTED
HX ETHANOL: <10 mg/dL
HX SALICYLATE: <4 mg/dL — ABNORMAL LOW (ref 15.0–29.9)
HX TCA: NOT DETECTED

## 2019-05-05 LAB — HX CHEM-ENZ-FRAC: HX TROPONIN I: <0.01 ng/mL (ref 0.00–0.03)

## 2019-05-05 LAB — HX TRANSFUSION

## 2019-05-05 LAB — HX DIABETES: HX GLUCOSE: 129 mg/dL (ref 70–139)

## 2019-05-05 NOTE — ED Provider Notes (Signed)
 .  .  [name], [name]  MRN: [id number]  Age: 43 yrs  Sex: Male  DOB: Nov 25, 1975  Arrival Date: 05/05/2019  Arrival Time: 13:22  Account#: [id number]  Bed A1  PCP: None, Pcp  Chief Complaint: Chest Pain - active CP, PMH: MI X 3  .  Presentation:  12/15  13:25 Presenting complaint: Patient states: 43 yo male with h/o CAD,  ps9        MI X 3 who developed left-sided CP radiating to the left arm.        Pain started while he was sitting in the car. He then had a        syncopal episode in the car. Did not take aspirin this a.m.        Does not take NTG because it drops his BP. Denies sob currently.  13:25 Method Of Arrival: Walk In                                      ps9  13:25 Acuity: Adult 2                                                 ps9  .  Historical:  - Allergies:  13:29 Coconut;                                                        ps9  13:29 Codeine;                                                        ps9  13:29 Motrin;                                                         ps9  13:29 Toradol;                                                        ps9  13:29 Tramadol HCl;                                                   ps9  13:29 NTG (drops his BP);                                             ps9  - Home Meds:  13:29 aspirin 81 mg Oral chew 1 tab once daily [Active]; Lipitor Oral ps9        [Active]; Metoprolol Tartrate Oral [Active];  - PMHx:  13:29 CAD; High Cholesterol; mi x3; NIDDM;                            ps9  - PSHx:  13:29 Cardiac Bypass Surgery x2; gallbladder surgery;                 ps9  .  - Social history: Smoking status: Patient uses tobacco    products, current every day smoker.  .  .  Screening:  13:33 SEPSIS SCREENING - Temp > 38.3 or < 36.0 No - Heart Rate > 90   ps9        No - Respiratory > 20 No - SBP < 90 No SIRS Criteria (> = 2)        No. Safety screen: Patient feels safe. Suicide (ED Safe)        Screening: In the past two weeks have you felt down, depressed        or  hopelessquestion No. Fall Risk None identified. Exposure        Risk/Travel Screening: COVID Symptomsquestion None. Known COVID 19        exposurequestion No. DPH requests Isolationquestion(COVID) No. Have you        tested + for COVIDquestion No.  .  Vital Signs:  13:32 BP 114 / 67; Pulse 85; Resp 17; Temp 36.9; Pulse Ox 96% on R/A; ps9  .  [name], [name]  [id number]  [id number]  Page 1 of 4  %%PAGE  .  [name], [name]  MRN: [id number]  Age: 86 yrs  Sex: Male  DOB: [birth year]  Arrival Date: 05/05/2019  Arrival Time: 13:22  Account#: [id number]  Bed A1  PCP: None, Pcp  Chief Complaint: Chest Pain - active CP, PMH: MI X 3  .        Pain 9/10;  16:05 BP 118 / 60; Pulse 84; Resp 16; Pulse Ox 98% on R/A;            cc9  .  Glasgow Coma Score:  13:32 Eye Response: spontaneous(4). Verbal Response: oriented(5).     ps9        Motor Response: obeys commands(6). Total: 15.  13:49 Eye Response: spontaneous(4). Verbal Response: oriented(5).     cc9        Motor Response: obeys commands(6). Total: 15.  .  Triage Assessment:  13:29 General: Appears uncomfortable, Behavior is cooperative. Pain:  ps9        Complains of pain in anterior aspect of left upper chest and        left breast Pain currently is 9/10. Neuro: No deficits noted.        Cardiovascular: Capillary refill Reports syncope. Respiratory:        No deficits noted.  .  Assessment:  13:49 General: Appears in no apparent distress, Behavior is           cc9        cooperative. General: Pt presents with CP that started about 30        min ago - rates as 9/10 on pain scale. Pt states pain similar        to pain he had in past with MI. EKG complete and reviewed by  attending. . Pain: Complains of pain in chest the pain radiates        to the: Pain radiates to left arm Pain currently is 9/10.        Neuro: No deficits noted. Cardiovascular: Capillary refill < 3        seconds Reports Rhythm is regular Chest pain. Respiratory:        Airway is patent  Respiratory effort is even, unlabored,        Respiratory pattern is regular, symmetrical, Denies shortness        of breath. GI: Reports nausea. Skin: Skin is normal.  14:14 General: RN at bedside attempting ultrasound IV access.         cc9  14:39 General: IV obtained and labs sent - pt to X-ray at this time . cc9  .  Observations:  13:22 Patient arrived in ED.                                          dt6  13:25 Patient Visited By: Criselda Peaches                             sk29  13:25 Registration completed.                                         sk29  13:29 Triage Completed.                                               ps9  13:33 Patient Visited ByRigoberto Noel, Amy                               am47  13:42 Patient Visited By: Seymour Bars                              apc1  15:14 Patient assigned to A1                                          kb30  .  Procedure:  14:01 Missed attempts: 20 gauge in left hand, in left antecubital     cc9  .  [name], [name]  [id number]  [id number]  Page 2 of 4  %%PAGE  .  [name], [name]  MRN: [id number]  Age: 39 yrs  Sex: Male  DOB: February 09, 1976  Arrival Date: 05/05/2019  Arrival Time: 13:22  Account#: [id number]  Bed A1  PCP: None, Pcp  Chief Complaint: Chest Pain - active CP, PMH: MI X 3  .        area.  14:36 BUN (Blood Urea Nitrogen) Sent.                                 cc9  14:36 CBC/Diff (With Plt) Sent.  cc9  14:36 CR (Creatinine) Sent.                                           cc9  14:36 GLU (Glucose) Sent.                                             cc9  14:36 LYTES (Na, K, Cl, Co2) Sent.                                    cc9  14:36 PT (Prothrombin Time With INR) Sent.                            cc9  14:36 PTT Sent.                                                       cc9  14:36 Troponin I Sent.                                                cc9  14:39 Inserted Ultrasound guidance was utilized to place peripheral    tl19        IV. 20 gauge In right Upper arm Blood collected (including any        ordered blood cultures). May 05, 2019 at 14:39.  16:06 Discontinued IV bleeding controlled, pressure dressing applied, cc9        No redness/swelling at site.  .  Dispensed Medications:  14:36 Drug: Aspirin 81mg  x 4 324 mg Route: PO;                        cc9  15:56 Not Given (Patient Refused): Tylenol 650 mg PO once             cc9  .  .  Interventions:  13:49 Placed in gown Call light in reach Bed in low position. Cardiac cc9        monitor on. Pulse ox on. NIBP on.  13:50 ECG/EKG Scanned into Chart                                      mm12  13:57 Demo Sheet Scanned into Chart                                   mm12  16:21 Outside Patient Records Scanned into Chart                      vs6  .  Outcome:  15:08 Decision to Hospitalize by Provider.  jms  15:48 Hospitalize undone.                                             apc1  15:52 Discharge ordered by MD.                                        apc1  16:06 Discharged to home ambulatory. Condition: stable. Discharge     cc9        instructions given to patient, Instructed on discharge        instructions, follow up and referral plans. Demonstrated        understanding of instructions. Discharge Assessment: Patient        awake, alert and oriented x 3. No cognitive and/or functional        deficits noted. Patient verbalized understanding of disposition        instructions. Chart Status Nursing note complete and        electronically signed.  16:06 Patient left the ED.                                            cc9  .  Corrections: (The following items were deleted from the chart)  .  [name], [name]  [id number]  [id number]  Page 3 of 4  %%PAGE  .  [name], [name]  MRN: [id number]  Age: 16 yrs  Sex: Male  DOB: 1975/11/07  Arrival Date: 05/05/2019  Arrival Time: 13:22  Account#: [id number]  Bed A1  PCP: None, Pcp  Chief Complaint: Chest  Pain - active CP, PMH: MI X 3  .  15:56 15:54 Tylenol 650 mg PO cc9                                     cc9  .  Signatures:  Barbette Merino                          MD   Jerilynn Mages, Melissa                             mm12  [place], Jill Side                       BSN  cc9  Daphene Calamity                        RN   ps9  Eula Listen                          kb30  [place], [place]                               vs6  Seymour Bars                          PA-C apc1  [place], Shonice                              sk29  [place], Djinnie                            dt6  Petty, Amy                           PA-C am47  Cecille Aver                          RN   [phone]  .  .  .  .  .  .  .  .  .  .  .  .  .  .  .  .  .  .  .  .  .  .  .  .  .  .  .  .  .  [name], [name]  [id number]  [id number]  Page 4 of 4  .  %%END

## 2019-05-05 NOTE — ED Provider Notes (Signed)
 [name]  [name], [name]  MRN: [id number]  Age: 43 yrs  Sex: Male  DOB: [birth year]  Arrival Date: 05/05/2019  Arrival Time: 13:22  Account#: [id number]  .  Working Diagnosis: Chest pain, unspecified  PCP: None, Pcp  .  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 He        reports acute onset left sided chest pain while sitting in his        car that radiates to his left arm. He reports after the pain a        syncopal episode. He has not taken anything for the discomfort        and reports nitro causes severe hypotension.  [name]  Historical:  - Allergies: Coconut; Codeine; Motrin; Toradol; Tramadol HCl; NTG (drops hi    BP);  - Home Meds: aspirin 81 mg Oral chew 1 tab once daily; Lipitor Oral;    Metoprolol Tartrate Oral;  - PMHx: CAD; High Cholesterol; mi x3; NIDDM;  - PSHx: Cardiac Bypass Surgery x2; gallbladder surgery;  - Social history: Smoking status: Patient uses tobacco    products, current every day smoker.  .  .  Vital Signs:  13:32 BP 114 / 67; Pulse 85; Resp 17; Temp 36.9; Pulse Ox 96% on R/A; ps9        Pain 9/10;  16:05 BP 118 / 60; Pulse 84; Resp 16; Pulse Ox 98% on R/A;            cc9  .  Glasgow Coma Score:  13:32 Eye Response: spontaneous(4). Verbal Response: oriented(5).     ps9        Motor Response: obeys commands(6). Total: 15.  13:49 Eye Response: spontaneous(4). Verbal Response: oriented(5).     cc9        Motor Response: obeys commands(6). Total: 15.  .  MDM:  14:35 Data reviewed: EKG, nurses notes, I interpreted the patient's   jms        EKG. It shows sinus rhythm rate 74 bpm. The axis is normal.        Intervals are normal. No ectopy or ischemic changes are noted.        The tracing is very similar to one performed 12/29/18.  15:55 Differential diagnosis: acute myocardial infarction, chest wall jms        pain, Drug  seeking. ED course: I reviewed the patient's [place]        database. It shows 29 prescriptions for controlled substances        in the last 52 weeks from 28 different providers.. Data        reviewed: radiologic studies, I visualized the patient's chest  .  [name], [name]  [id number]  [id number]  Page 1 of 6  %%PAGE  .  [name], [name]  MRN: [id number]  Age: 40 yrs  Sex: Male  DOB: [birth year]  Arrival Date: 05/05/2019  Arrival Time: 13:22  Account#: [id number]  .  Working Diagnosis: Chest pain, unspecified  PCP: None, Pcp  .        rate of graft. There is mild cardiomegalymild cardiomegaly.  There are changes status post tenotomy including sternotomy        wires and a sternotomy closure clamp. No acute pulmonary        infiltrate or pleural effusions are noted.. Counseling: I had a        detailed discussion with the patient and/or guardian regarding:        the historical points, exam findings, and any diagnostic        results supporting the discharge diagnosis, need for followup,        to return to the emergency department if symptoms worsen or        persist or if there are any questions or concerns that arise at        home.  .  12/15  13:24 Order name: EDIE_CAREPLAN; Complete Time: 13:34                 dispa  t  12/15  13:24 Order name: PATIENTPING STORY; Complete Time: 13:34             dispa  t  12/15  13:32 Order name: BUN (Blood Urea Nitrogen); Complete Time: 15:19     am47  12/15  13:32 Order name: CBC/Diff (With Plt); Complete Time: 14:58           am47  12/15  13:32 Order name: CR (Creatinine); Complete Time: 15:19               am47  12/15  13:32 Order name: GLU (Glucose); Complete Time: 15:19                 am47  12/15  13:32 Order name: LYTES (Na, K, Cl, Co2); Complete Time: 15:19        am47  12/15  13:32 Order name: PT (Prothrombin Time With INR); Complete Time: 15:04am47  12/15  13:32 Order name: PTT; Complete Time: 15:04                           am47  12/15  13:32  Order name: Troponin I; Complete Time: 15:19                    am47  12/15  14:10 Order name: Tox Screen (Urine)                                  jms  12/15  14:10 Order name: UA w/Refl Cult                                      jms  12/15  15:15 Order name: GFR, AA; Complete Time: 15:19                       dispa  t  12/15  15:15 Order name: GFR, NAA; Complete Time: 15:19                      dispa  t  12/15  13:32 Order name: CXR (PA/Lat); Complete Time: 15:01                  am47  12/15  .  [name], [name]  [id number]  [id number]  Page 2 of 6  %%PAGE  .  [name],  [name]  MRN: [id number]  Age: 75 yrs  Sex: Male  DOB: 1975/09/23  Arrival Date: 05/05/2019  Arrival Time: 13:22  Account#: [id number]  .  Working Diagnosis: Chest pain, unspecified  PCP: None, Pcp  .  13:32 Order name: Adult EKG (order using folder); Complete Time: 13:32am47  12/15  13:32 Order name: Cardiac Monitor; Complete Time: 13:52               am47  12/15  13:32 Order name: EKG (order using folder); Complete Time: 13:52      am47  12/15  13:32 Order name: Pulse Oximetry Continuous; Complete Time: 13:52     am47  12/15  14:10 Order name: ADD Tox Screen - Serum; Complete Time: 14:13        jms  12/15  15:07 Order name: Diet, Clear liquid; Complete Time: 15:45            jms  12/15  15:07 Order name: IV Saline Lock; Complete Time: 15:45                jms  12/15  15:07 Order name: Refer to ED-OBS; Complete Time: 15:45               jms  12/15  15:07 Order name: Serial EKG q 3 Hrs X 2                              jms  12/15  15:07 Order name: Serial Troponin q 3 Hrs X 2                         jms  12/15  15:15 Order name: Serum Drug Screen; Complete Time: 15:19             dispa  t  12/15  15:19 Order name: Blood Bank Hold; Complete Time: 15:25               dispa  t  .  Dispensed Medications:  14:36 Drug: Aspirin 81mg  x 4 324 mg Route: PO;                        cc9  15:56 Not Given (Patient Refused): Tylenol 650 mg PO once              cc9  .  .  Radiology Orders:  Order Name: CXR (PA/Lat); Last Status: Reviewed; Time: 05/05/19    13:32; By: AV40; For: am47; Order Method: Electronic  Attending Notes:  14:30 Attestation: Assessment and care plan reviewed with             jms        resident/midlevel provider. See their note for details.        Physician Assistant's history reviewed, patient interviewed and        examined. Attending HPI: HPI: The patient reports dull        left-sided upper chest pain for about 1 hour. He reports his        pain radiates to the left side of the neck, and down the left        arm. He reports no fever, chills, nausea, vomiting. He notes no        diaphoresis, palpitations, or shortness of breath. He reports a        history of CAD/CABG/MI. He reports the symptoms are similar  to  .  [name], [name]  [id number]  [id number]  Page 3 of 6  %%PAGE  .  [name], [name]  MRN: [id number]  Age: 79 yrs  Sex: Male  DOB: 1976-02-09  Arrival Date: 05/05/2019  Arrival Time: 13:22  Account#: [id number]  .  Working Diagnosis: Chest pain, unspecified  PCP: None, Pcp  .        past MIs. He reports he is not taking any medications for this.        He reports that nitroglycerin "drops his blood pressure". He        reports his last episode of chest pain was 4-5 months before        today's visit. Attending ROS As documented by Physician        Assistant ROS and HPI. Attending Exam: My personal exam reveals        The patient is able to, well-nourished, black male. He is alert        and conversant. The conjunctival clear, the pupils are equal        and reactive. The voice is normal. The chest is clear. There is        a well-healed median sternotomy scar noted. There is no chest        wall tenderness. The abdomen is soft and nontender. The        patient's mentation is lucid, he follows commands. He moves all        4 extremities. The speech is fluent. The cranial nerves are        intact. I have reviewed Nurses  Notes.  14:31 ED Course: The patient's report of no chest pain for the past   jms        4-5 months is at variance with the [facility] database. This shows        the patient has been seen 3 times this month with chest pain at        other emergency departments. It also shows that he was seen        twice yesterday with chest pain. It shows patient has had 30        emergency department visits in the last 52 weeks.  15:21 ED Course: The patient was seen last PM at [facility] ED in   [facility]. They report that he left AMA at 10 am today.  15:50 ED Course: The patient was observed for 2.5 hours in the emerge jms        department. He was offered APAP but he declined. My Working        Impression: Factitious chest pain, drug-seeking. Attending        chart complete and electronically signed: J.M.Jeannett Senior MD [id number].  [name]  ED Observation:  15:08 REFER PATIENT TO ED OBSERVATION STATUS. The patient was         jms        informed of the need for further observational care. Diagnosis:        Chest Pain. Reason for ED Observation: At the time of referral        to ED Observation status, a more precise diagnosis is needed        and further observation and testing is required for diagnostic        accuracy. TREATMENT PLAN: EKG Monitoring Serial Cardiac        Enzymes. Family history No immediate family  members are acutely        ill.  15:50 Progress Note: The patient was observed in emergency department jms        for about 2.5 hours. He was stable. He had no EKG changes.        Observation discharge summary: Clinical Course: The patient was        stable in the emergency department. Despite the patient's        statement that he is not a chest pain and 4-5 months, he was        indeed seen at [facility] large department last        evening with chest pain, and he left there AMA this morning. My  .  [name], [name]  [id number]  [id number]  Page 4 of 6  %%PAGE  .  [name], [name]  MRN:  [id number]  Age: 37 yrs  Sex: Male  DOB: [birth year]  Arrival Date: 05/05/2019  Arrival Time: 13:22  Account#: [id number]  .  Working Diagnosis: Chest pain, unspecified  PCP: None, Pcp  .        exam shows: Cardiovascular exam: Normal Rate. Neuro exam shows:        Alert and oriented x 3. Moves all extremities equally. ENT:        Airway Patent. Constitution: Patient is afebrile. Diagnosis:        Factitious chest pain, drug-seeking Disposition: Patient will        be discharged from the emergency department.  .  Disposition Summary:  05/05/19 15:52  Discharge Ordered        Location: Home -(05/05/19 15:52)                                apc1        Problem: new(05/05/19 15:52)                                    apc1        Symptoms: have improved(05/05/19 15:52)                         apc1        Condition: Stable(05/05/19 15:52)                               apc1        Diagnosis          - Chest pain, unspecified(05/05/19 15:52)                     apc1        Followup:                                                       apc1          - With: Private Physician          - When: 3 - 5 days          - Reason: Continuance of care        Discharge Instructions:          -  Discharge Summary Sheet                                     apc1          - CHEST PAIN, Uncertain Cause                                 apc1        Forms:          - Medication Reconciliation Form                              apc1          - Fax Summary                                                 apc1  Signatures:  Barbette Merino                          MD   jms  Dispatcher, [employer]                          dispa  [employer], Melissa                             mm12  [place], [place]                       BSN  cc9  [place], Rodell Perna                        RN   ps9  [place], Katherine                          kb30  [place], Adam                          PA-C apc1  [place], Shonice                              sk29  [place], Amy                            [place] ZO10  .  Corrections: (The following items were deleted from the chart)  13:36 13:32 Triage PA: The patient is a 43 year old male PMH CAD,     am47        HLD, NIDDM and MI who presents for evaluation of chest pain. He        reports acute onset left sided chest pain while sitting in his        car that radiates to his left arm. He reports after the pain a        syncopal episode. He has not taken anything for the discomfort  .  [name], [name]  [id number]  [id number]  Page 5 of 6  %%PAGE  .  [name], [name]  MRN: [id number]  Age: 67 yrs  Sex: Male  DOB: [birth year]  Arrival Date: 05/05/2019  Arrival Time: 13:22  Account#: [id number]  .  Working Diagnosis: Chest pain, unspecified  PCP: None, Pcp  .        and reports nitro causes severe hypotension. am47  14:35 14:31 ED Course: The patient's report of no chest pain for the  jms        past 4-5 months is at variance with the [facility] database. jms  15:14 15:08 jms                                                       kb30  15:48 15:08 Observation jms                                           apc1  15:48 15:08 Barbette Merino jms                                        apc1  15:48 15:08 ED Obsv jms                                               apc1  15:48 15:08 Stable jms                                                apc1  15:48 15:08 new jms                                                   apc1  15:48 15:08 are unchanged jms                                         apc1  15:48 15:08 Regular jms                                               apc1  15:48 15:08 Chest pain, unspecified jms                               apc1  15:48 15:14 ED Obs kb30                                               apc1  .  Document is preliminary  until electronically or manually signed by the atte  nding physician  .  .  .  .  .  .  .  .  .  .  .  .  .  .  .  .  .  .  .  .  .  .  .  .  .  .  .  .  [name], [name]  [id number]  [id number]  Page 6 of  6  .  %%END

## 2019-06-05 LAB — COVID-19 ANTIGEN CARE EVERYWHERE
COVID-19 ANTIGEN CARE EVERWHERE: NEGATIVE
LOT# / EXP DATE CARE EVERYWHERE: 128449

## 2019-06-09 LAB — PROTHROMBIN TIME CARE EVERYWHERE
INR CARE EVERYWHERE: 1 — NL
PROTHROMBIN TIME CARE EVERYWHERE: 11.7 s — NL (ref 9.4–12.5)

## 2019-06-09 LAB — APTT CARE EVERYWHERE: APTT CARE EVERYWHERE: 29 s — NL (ref 25.0–37.0)

## 2019-06-09 LAB — ANTIBODY SCREEN CARE EVERYWHERE: ANTIBODY SCREEN CARE EVERYWHERE: NEGATIVE — NL

## 2019-06-09 LAB — ABO/RH CARE EVERYWHERE: ABO/RH CARE EVERYWHERE: O POS — NL

## 2019-06-10 LAB — LIPID PROFILE (EXT)
Chol/HDL Ratio (EXT): 6.9 ratio
Cholesterol (EXT): 235 mg/dL
HDL Cholesterol (EXT): 34 mg/dL
LDL Cholesterol, CALC (EXT): 164 mg/dL
Triglycerides (EXT): 184 mg/dL

## 2019-06-10 LAB — UNMAPPED LAB RESULTS: Phosphorous (EXT): 4.8 mg/dL — ABNORMAL HIGH (ref 2.5–4.5)

## 2019-06-10 LAB — HEMOGLOBIN A1C
Estimated Average Glucose mg/dL (INT/EXT): 173 mg/dL
HEMOGLOBIN A1C % (INT/EXT): 7.6 % — ABNORMAL HIGH (ref 4.3–5.6)

## 2019-06-10 LAB — ABO/RH CARE EVERYWHERE

## 2019-06-10 LAB — HEMOGLOBIN A1C CARE EVERYWHERE
ESTIMATED AVERAGE GLUCOSE CARE EVERYWHERE: 173 mg/dL — NL
HEMOGLOBIN A1C CARE EVERYWHERE: 7.6 % — ABNORMAL HIGH (ref 4.3–5.6)

## 2019-06-13 ENCOUNTER — Ambulatory Visit: Admitting: Hospitalist

## 2019-06-13 LAB — HX CBC W/DIFF
HX ABSOLUTE BASO COUNT AUTODIFF: 0.02 10*3/uL (ref 0.0–0.22)
HX ABSOLUTE EOS COUNT AUTODIFF: 0.09 10*3/uL (ref 0.0–0.45)
HX ABSOLUTE LYMPHS COUNT AUTODIFF: 2.36 10*3/uL (ref 0.74–5.04)
HX ABSOLUTE MONO COUNT AUTODIFF: 0.43 10*3/uL (ref 0.0–1.34)
HX ABSOLUTE NEUTRO COUNT AUTODIFF: 3.73 10*3/uL (ref 1.48–7.95)
HX BASOPHIL AUTOMATED: 0.3 %
HX EOSINOPHIL AUTOMATED: 1.4 %
HX HEMATOCRIT: 41.5 % (ref 39.0–53.0)
HX HEMOGLOBIN: 13.3 g/dL (ref 13.0–17.5)
HX IG AUTOMATED: 0.5 % (ref 0.0–2.0)
HX LYMPHOCYTE AUTOMATED: 35.4 %
HX MEAN CORP.HEMO.CONC.: 32 g/dL (ref 31.0–37.0)
HX MEAN CORPUSCULAR HEMOGLOBIN: 29.8 pg (ref 26.0–34.0)
HX MEAN CORPUSCULAR VOLUME: 92.8 fL (ref 80.0–100.0)
HX MEAN PLATELET VOLUME: 10.2 fL (ref 9.4–12.4)
HX MONOCYTE AUTOMATED: 6.5 %
HX NEUTROPHIL AUTOMATED: 55.9 %
HX PLATELET COUNT: 218 10*3/uL (ref 150.0–400.0)
HX RED BLOOD COUNT: 4.47 M/uL (ref 4.2–5.9)
HX RED CELL DISTRIBUTION WIDTH SD: 47 fL (ref 35.0–51.0)
HX WHITE BLOOD COUNT: 6.7 10*3/uL (ref 4.0–11.0)

## 2019-06-13 LAB — HX TROPONIN I
HX TROPONIN I: 0.019 ng/mL (ref 0.015–0.045)
HX TROPONIN I: 0.019 ng/mL (ref 0.015–0.045)
HX TROPONIN I: 0.021 ng/mL (ref 0.015–0.045)

## 2019-06-13 LAB — HX COMPREHENSIVE METABOLIC PANEL
HX ALBUMIN: 3.4 g/dL (ref 3.2–5.0)
HX ALKALINE PHOSPHATASE: 59 U/L (ref 30.0–117.0)
HX ALT: 111 U/L — ABNORMAL HIGH (ref 6.0–55.0)
HX ANION GAP: 4 mmol/L (ref 3.0–11.0)
HX AST: 59 U/L — ABNORMAL HIGH (ref 6.0–40.0)
HX BICARBONATE: 27 mmol/L (ref 21.0–32.0)
HX BUN/CREAT RATIO: 13.9 (ref 12.0–20.0)
HX BUN: 16 mg/dL (ref 7.0–23.0)
HX CALCIUM: 9.2 mg/dL (ref 8.5–10.5)
HX CHLORIDE: 109 mmol/L (ref 98.0–110.0)
HX CREATININE: 1.16 mg/dL (ref 0.4–1.3)
HX GLOMERULAR FR AFRICAN AMERICAN: 84
HX GLOMERULAR FR NON AFRICAN AMER: 69
HX GLUCOSE: 150 mg/dL — ABNORMAL HIGH (ref 70.0–110.0)
HX POTASSIUM: 3.9 mmol/L (ref 3.6–5.2)
HX SODIUM: 140 mmol/L (ref 136.0–146.0)
HX TOTAL BILIRUBIN: 0.4 mg/dL (ref 0.2–1.2)
HX TOTAL PROTEIN: 7.8 g/dL (ref 6.0–8.4)

## 2019-06-13 LAB — HX MAGNESIUM: HX MAGNESIUM: 1.8 mg/dL (ref 1.7–2.5)

## 2019-06-13 LAB — HX FLU A/B, SARS, RSV
HX INFLUENZA A PCR: NEGATIVE
HX INFLUENZA B PCR: NEGATIVE
HX RESPIRATORY SYNCTYIAL VIRUS: NEGATIVE
HX SARS COV 2 RESULT: NEGATIVE

## 2019-06-13 LAB — HX TOTAL CPK
HX TOTAL CPK: 225 U/L — ABNORMAL HIGH (ref 44.0–196.0)
HX TOTAL CPK: 204 U/L — ABNORMAL HIGH (ref 44.0–196.0)
HX TOTAL CPK: 204 U/L — ABNORMAL HIGH (ref 44.0–196.0)

## 2019-06-14 ENCOUNTER — Ambulatory Visit

## 2019-06-14 ENCOUNTER — Ambulatory Visit: Admitting: Emergency Medicine

## 2019-06-14 LAB — HX CBC W/ DIFF
CASE NUMBER: 2021024000283
CASE NUMBER: 20592156
HX ABSOLUTE NRBC COUNT: 0 10*3/uL
HX ABSOLUTE NRBC COUNT: 0 10*3/uL
HX HCT: 39.6 % — NL (ref 39.0–53.0)
HX HCT: 39.6 % — NL (ref 39.0–53.0)
HX HGB: 12.8 g/dL — ABNORMAL LOW (ref 13.0–17.5)
HX HGB: 12.6 g/dL — ABNORMAL LOW (ref 13.0–17.5)
HX MCH: 29.7 pg — NL (ref 26.0–34.0)
HX MCH: 29.6 pg — NL (ref 26.0–34.0)
HX MCHC: 32.3 g/dL — NL (ref 31.0–37.0)
HX MCHC: 31.8 g/dL — NL (ref 31.0–37.0)
HX MCV: 91.9 fL — NL (ref 80.0–100.0)
HX MCV: 93.2 fL — NL (ref 80.0–100.0)
HX MPV: 11.1 fL — NL (ref 9.4–12.4)
HX MPV: 11.6 fL — NL (ref 9.4–12.4)
HX NRBC PERCENT: 0 % — NL
HX NRBC PERCENT: 0 % — NL
HX PLATELET: 222 10*3/uL — NL (ref 150.0–400.0)
HX PLATELET: 206 10*3/uL — NL (ref 150.0–400.0)
HX RBC: 4.31 10*6/uL — NL (ref 4.2–5.9)
HX RBC: 4.25 10*6/uL — NL (ref 4.2–5.9)
HX RDW-CV: 13.6 % — NL (ref 11.5–14.5)
HX RDW-CV: 13.6 % — NL (ref 11.5–14.5)
HX RDW-SD: 46.4 fL — NL (ref 35.0–51.0)
HX RDW-SD: 46.8 fL — NL (ref 35.0–51.0)
HX WBC: 7.4 10*3/uL — NL (ref 4.0–11.0)
HX WBC: 8.4 10*3/uL — NL (ref 4.0–11.0)

## 2019-06-14 LAB — HX COMPREHENSIVE METABOLIC PANEL
CASE NUMBER: 2021024000283
HX ALBUMIN LVL: 3.4 g/dL — NL (ref 3.2–5.0)
HX ALKALINE PHOSPHATASE: 61 U/L — NL (ref 30.0–117.0)
HX ALT: 110 U/L — ABNORMAL HIGH (ref 6.0–55.0)
HX ANION GAP: 5 — NL (ref 3.0–11.0)
HX AST: 56 U/L — ABNORMAL HIGH (ref 6.0–40.0)
HX BILIRUBIN TOTAL: 0.4 mg/dL — NL (ref 0.2–1.2)
HX BUN: 15 mg/dL — NL (ref 6.0–20.0)
HX CALCIUM LVL: 8.9 mg/dL — NL (ref 8.5–10.5)
HX CHLORIDE: 107 mmol/L — NL (ref 98.0–110.0)
HX CO2: 29 mmol/L — NL (ref 21.0–32.0)
HX CREATININE: 1.16 mg/dL — NL (ref 0.55–1.3)
HX GLUCOSE LVL: 144 mg/dL — ABNORMAL HIGH (ref 70.0–110.0)
HX POTASSIUM LVL: 4.8 mmol/L — NL (ref 3.6–5.2)
HX SODIUM LVL: 141 mmol/L — NL (ref 136.0–146.0)
HX TOTAL PROTEIN: 7 g/dL — NL (ref 6.0–8.4)

## 2019-06-14 LAB — HX .AUTOMATED DIFF
CASE NUMBER: 2021024000283
CASE NUMBER: 2021024000576
HX ABSOLUTE BASO COUNT: 0.02 10*3/uL — NL (ref 0.0–0.22)
HX ABSOLUTE BASO COUNT: 0.01 10*3/uL — NL (ref 0.0–0.22)
HX ABSOLUTE EOS COUNT: 0.08 10*3/uL — NL (ref 0.0–0.45)
HX ABSOLUTE EOS COUNT: 0.11 10*3/uL — NL (ref 0.0–0.45)
HX ABSOLUTE LYMPHS COUNT: 2 10*3/uL — NL (ref 0.74–5.04)
HX ABSOLUTE LYMPHS COUNT: 2.33 10*3/uL — NL (ref 0.74–5.04)
HX ABSOLUTE MONO COUNT: 0.37 10*3/uL — NL (ref 0.0–1.34)
HX ABSOLUTE MONO COUNT: 0.48 10*3/uL — NL (ref 0.0–1.34)
HX ABSOLUTE NEUTRO COUNT: 4.89 10*3/uL — NL (ref 1.48–7.95)
HX ABSOLUTE NEUTRO COUNT: 5.47 10*3/uL — NL (ref 1.48–7.95)
HX BASOPHILS: 0.3 %
HX BASOPHILS: 0.1 %
HX EOSINOPHILS: 1.1 %
HX EOSINOPHILS: 1.3 %
HX IMMATURE GRANULOCYTES: 0.3 % — NL (ref 0.0–2.0)
HX IMMATURE GRANULOCYTES: 0.5 % — NL (ref 0.0–2.0)
HX LYMPHOCYTES: 27.1 %
HX LYMPHOCYTES: 27.6 %
HX MONOCYTES: 5 %
HX MONOCYTES: 5.7 %
HX NEUTROPHILS: 66.2 %
HX NEUTROPHILS: 64.8 %

## 2019-06-14 LAB — HX HEPATITIS C ANTIBODY
CASE NUMBER: 2021024000283
HX HEP C AB INST: REACTIVE — AB
HX HEP C AB MEASURED: >11 — ABNORMAL HIGH (ref 0.0–0.8)
HX HEP C AB: REACTIVE — AB

## 2019-06-14 LAB — HX COVID19 BY PCR (LGH)
CASE NUMBER: 2021024000562
HX COVID19 BY PCR: NOT DETECTED

## 2019-06-14 LAB — HX .MDPH REPORTABLES
CASE NUMBER: 2021024000283
HX MDPH HEPBSAG: NEGATIVE
HX MDPH HEPCAB: POSITIVE

## 2019-06-14 LAB — HX LAVENDER TOP TO HOLD: CASE NUMBER: 2021024000576

## 2019-06-14 LAB — HX GLOMERULAR FILTRATION RATE (ESTIMATED)
CASE NUMBER: 2021024000283
HX AFN AMER GLOMERULAR FILTRATION RATE: 90 mL/min/{1.73_m2}
HX NON-AFN AMER GLOMERULAR FILTRATION RATE: 78 mL/min/{1.73_m2}

## 2019-06-14 LAB — HX HEPATITIS B CORE ANTIBODY
CASE NUMBER: 2021024000283
HX HEP B CORE AB: NONREACTIVE

## 2019-06-14 LAB — HX HEPATITIS B SURFACE ANTIBODY
CASE NUMBER: 2021024000283
HX HEP B SURFACE AB CONCENTRATION: <3
HX HEP B SURFACE AB: NONREACTIVE

## 2019-06-14 LAB — HX HEPATITIS B SURFACE ANTIGEN
CASE NUMBER: 2021024000283
HX HBSAG MEASURED: <0.1 — NL (ref 0.0–0.99)
HX HEP B SURFACE AG: NEGATIVE
HX HEP BS AG INST: NONREACTIVE

## 2019-06-14 LAB — HX TROPONIN I
CASE NUMBER: 2021024000283
CASE NUMBER: 2021024000576
HX TROPONIN I: <0.015 — NL (ref 0.015–0.045)
HX TROPONIN I: <0.015 — NL (ref 0.015–0.045)

## 2019-06-14 LAB — HX BLUE TOP TO HOLD: CASE NUMBER: 2021024000283

## 2019-06-14 LAB — HX HEPATITIS A ANTIBODY IGM
CASE NUMBER: 2021024000283
HX HEP A IGM: NONREACTIVE

## 2019-06-14 NOTE — Consults (Signed)
 Name :  [name]    DOB :  [id number]    Sex :  Male    MRN :  [id number]    Reason for Consultation    History of Present Illness    HPI: Asked to see pt for the hospitalist service in regards to the chief  complaint of chest discomfort.        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 Initially had a LIMA placed which became atretic  and in 2012 had his RCA reimplanted at [facility].        He has had multiple admissions to our hospital with chest discomfort at times  has become belligerent due to not getting dilaudid on time. He has sought  medical care in multiple different emergency rooms. He does not follow with a  cardiologist routinely        Patient states he recently moved back to [place] from [place]. He works as  a Optometrist and goes to [employer] on a semi-regular basis walking on the  treadmill. He notes chest discomfort  intermittently typically at rest with  symptoms lasting for hours. The symptoms recurred yesterday and he describes  them as severe bringing him to the hospital.        He States his symptoms have waxed and wanede overnight but are quite severe  during our interview although he appears comfortable eating french fries  [place]        ROS:  Denies severe exertional sob, syncope, palpitations,  black tarry stool,  tremor, pleuritic chest pain, green sputum, anorexia, urinary hesitancy,  pruritis  All other systems reviewed and are negative            PMH:    As above        SOCIAL HISTORY:    Active smoker            FAMILY HISTORY:    Positive for premature CAD            PE: 60 PRN 90s-50s    GENERAL:  Well developed, well nourished appears comfortable in no acute  distress obese    EYES:  Anicteric sclerae, pupils round and equal  conjunctivae pink,    HEENT:  normocephalic.  Mucous membranes intact and moist  Oropharynx is clear  .    NECK:  No thyromegaly or LAN, no bruits bilaterally  JVD =  none    HEART: S1, S2 are normal  No S3, no S4, no or rubs.  Regular    No significant Murmurs    CHEST: clear to auscultation    ABDOMEN:  No hepatosplenomegaly palpated +bs,  Soft nondistended nontender    EXTREMITIES:  No edema    pulses equal bilaterally throughout    SKIN:  warm and dry w/o cyanosis  multiple tattoos    NEURO: grossly nonfocal with intact cranial nerves thick glasses    PSYCH:  appropriate and A+O x 3        CXR  (personally reviewed) normal heart size sternal wires with a large  sternal clamp w/o CHF    EKG personally reviewed: NSR with nonspecific ST changes    Undetectable troponins    labs as below            *********************************************************    Noncardiac chest symptoms in a patient with behavior consistent with drug  seeking in the past. No evidence  for ACS or ischemia. At risk for underlying  atherosclerosis although initial bypass surgeries were due to a congenital  anomaly        Reasonable to pursue a stress echo on 1/25 and if this is unremarkable, I  would not pursue further cardiac testing at this time    We will plan to sign off      Review of Systems    Physical Exam    Vitals & Measurements    **T: **98 ??F (Oral) **HR: **60 **RR: **14 **BP: **94/52 **SpO2: **96%    **HT: **193 cm **WT: **122 Kg **BMI: **32.75    Diagnostic Results    Impression and Plan    Chest pain (Complaint of), Chest pain        Medications    _Inpatient_    aspirin 81 mg oral enteric coated tablet, 81 mg= 1 tab(s), PO, Daily    atorvastatin, 80 mg= 2 tab(s), PO, Daily    Dextrose 10% Water (hypoglycemia), 125 mL, IV Piggyback, ud, PRN    Dextrose 10% Water (hypoglycemia), 250 mL, IV Piggyback, ud, PRN    Dextrose 50% For Protocol, per protocol, IV Push, ud, PRN    Dilaudid, 0.25 mg= 0.25 mL, IV Push, q24hr, PRN    glucagon, 1 mg= 1 EA, IM, ud, PRN    glucose 40% oral gel, 15 Gm= 39 mL, PO, ud, PRN    HumaLOG Low, 0-12 Unit(s), sc, QIDACHS    Metoprolol Tartrate (Lopressor), 25 mg=  1 tab(s), PO, BID    oxyCODONE, 5 mg= 1 tab(s), PO, q6hr, PRN    Protonix, 40 mg= 1 EA, IV Push, q24hr    Sodium Chloride 0.9% Flush, 3 mL, Flush, q8hr    Zofran, 4 mg= 2 mL, IV, q8hr, PRN    _Home_    aspirin 81 mg oral enteric coated tablet, 81 mg= 1 tab(s), PO, Daily    atorvastatin 40 mg oral tablet, 80 mg= 2 tab(s), PO, Daily    Metoprolol Tartrate 25 mg oral tablet, 25 mg= 1 tab(s), PO, BID    oxyCODONE 5 mg oral tablet, 5 mg= 1 tab(s), PO, q6hr, Partial fill upon  request    Allergies    Motrin (Hives, Anaphylactic reaction)    nitroglycerin    traMADOL    Toradol    Social History    Smoking Status    Current every day smoker    Alcohol    Current    Substance Use \- Denies Substance Abuse    Tobacco    Current, Cigarettes    Family History    Lab Results     BUN: 15 mg/dL (35/46/56 81:27:51)    ---    CO2: 29 mmol/L (06/14/19 03:14:00)    Creatinine: 1.16 mg/dL (70/01/74 94:49:67)    Glucose Lvl: 144 mg/dL High (59/16/38 46:65:99)    Hct: 39.6 % (06/14/19 09:04:00)    Platelet: 206 thous/mm3 (06/14/19 09:04:00)    Potassium Lvl: 4.8 mmol/L (06/14/19 03:14:00)    Sodium Lvl: 141 mmol/L (06/14/19 03:14:00)    Troponin I: <0.015 (06/14/19 09:04:00)    WBC: 8.4 thous/mm3 (06/14/19 09:04:00)        SIGNATURE LINE Electronically signed by Alvino Chapel MD, Denton Brick on 06/14/2019 at  13:48:43 EST

## 2019-06-14 NOTE — ED Notes (Signed)
Please click on link to see document

## 2019-06-14 NOTE — H&P (Signed)
 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    The patient immediately started to have left-sided chest discomfort that went  down his right of left arm.        It was about 10 out of 10 for about 2 to 3 hours until he came to the  emergency room his received my 1 mg of IV Dilaudid.    The patient does not regularly have oxycodone at home however does  occasionally complain of pain.    The patient has not been in to see a cardiologist as he recently is back in  [place] from [place] due to family.    That looked at prior records it looks like the patient had not established  with a cardiologist last year as well.        Patient denies any cough phlegm production no sick contacts at home no chest  trauma no rash orthopnea PND shortness of breath.        ?<?<?<?<?<?<?<Patient did say that his heart was racing but denied any specific  abnormal arrhythmia regarding his palpitations.          PAST MEDICAL HISTORY:    CABG , anomolous RCA, surgery at [facility] 2012.  coronary artery  disease    Tobacco abuse.    DM2    Hypertension    Hyperlipiedemia        Past Surgtical hx:    cholecystectomy/CABG            social hx:    ongoing tobacco use    The patient drinks alcohol very occasionally is down to both 2 cigarettes/day    Does not smoke marijuana    No IV drug use    No other recreational drug use                  Review of Systems        Associated Symptoms    Constitutional symptoms:  Negative.    Syncope:  Negative.    Heart rate:  Within normal limits.    Other respiratory symptoms:  Negative.         Review of Systems    Musculoskeletal symptoms:  Negative    Other significant review of systems  All other systems reviewed and otherwise  negative    [1]    Code Status    Code Status - Ordered    -- 06/14/19 8:32:00 EST, Full Resuscitation, Constant Order    Physical Exam    Vitals & Measurements    **T: **97.9 F (Oral) **HR: **74 **RR: **15 **BP: **105/52 **SpO2: **96% **WT:  **122 Kg    General:  Alert.    Skin:  Warm, dry.    Head:  Atraumatic.    Neck:  Trachea midline.    Eye:  Normal conjunctiva.    Ears, nose, mouth and throat:  Oral mucosa moist.    Cardiovascular:  Normal peripheral perfusion, Pulses are equal and  symmetric.[name]    Respiratory:  Lungs are clear to auscultation, respirations are non-labored.    Chest wall:  No tenderness, No deformity.    Musculoskeletal:  No deformity,    Gastrointestinal:  Soft, Nontender, Non distended.    Neurological:  No focal neurological deficit observed.    Lymphatics:  No lymphadenopathy.    Psychiatric:  Cooperative. [2]    Impression and Plan    Chest pain (Complaint of), Chest pain    Admit Patient To    atorvastatin, Dose: 80 mg = 2 tab(s), Tab, PO, Daily, First Dose Date/Time:  06/14/19 21:00:00 EST    Dextrose 10% in Water intravenous solution, Dose: 125 mL, Soln, IV Piggyback,  ud, PRN, Blood Glucose, First Dose Date/Time: 06/14/19 8:34:00 EST    Dextrose 10% in Water intravenous solution, Dose: 250 mL, Soln, IV Piggyback,  ud, PRN, Blood Glucose, 1000 ml/hr, 250 ml (TOTAL VOLUME), First Dose  Date/Time: 06/14/19 8:34:00 EST    glucagon, Dose: 1 mg = 1 EA, Injection, IM, ud, PRN, Blood Glucose, First Dose  Date/Time: 06/14/19 8:34:00 EST, For SEVERE hypoglycemia if IV access is not  available.    glucose, Dose: 15 Gm = 39 mL, Gel, PO, ud, PRN, Blood Glucose, First Dose  Date/Time: 06/14/19 8:34:00 EST    glucose, per protocol, Soln, IV Push, ud, PRN, Blood Glucose, Start Date/Time:  06/14/19 8:34:00 EST    insulin lispro, Dose: 0-12 Unit(s),  Injection, sc, QIDACHS, First Dose  Date/Time: 06/14/19 11:30:00 EST, LOW SCALE    ondansetron, Dose: 4 mg = 2 mL, Injection, IV, q8hr, PRN, Nausea/Vomiting,  First Dose Date/Time: 06/14/19 8:32:00 EST    oxyCODONE, Dose: 5 mg = 1 tab(s), Tab, PO, q6hr, PRN, Pain, Severe, First Dose  Date/Time: 06/14/19 8:34:00 EST    pantoprazole, Dose: 40 mg = 1 EA, Injection, IV Push, q24hr, First Dose  Date/Time: 06/14/19 9:00:00 EST    sodium chloride, Dose: 3 mL, Injection, Flush, q8hr, First Dose Date/Time:  06/14/19 9:00:00 EST    .Oxygen Protocol Details Respiratory    .Oxygen Respiratory Charge    Bedrest    Blood Glucose Monitoring POC    CBC w/ Diff    CHO Consistent Diet    Code Status    Comprehensive Metabolic Panel    Continuous Cardiac Monitoring    EKG    Hemoglobin A1c    Intake and Output    Lipid Panel    Magnesium Level    Peripheral IV Care    Phosphorus Level    PT    PTT    Saline Lock    Sequential Compression Device    Troponin I    Vital Signs                    Chest pain , NTG allergy    atypical symptoms, occuring at rest.    no concerning EKG changes at this time.    given non established care with cardiology,consideration for cath vs. stress  test by cardiology    confirmed the patient doesn't have asa allergy, he has been on asa in the  past.        RML atelectasis.    - no current symptoms of cough, fever, shortness of breath, leukocytosis not  noted on labs    no concerns for pneumonia at this time.    f/u given extensive smoking history, consider CT outpt.        Elevated LFTS  check hepatitis panel.        dvt ppx: ambulatory, if pt stays second night consider hep sc.    CHO Consistent Diet - Ordered    -- 06/14/19 8:32:00 EST, Room Service, Scheduled / PRN, 60gm/Meal        Code Status - Ordered    -- 06/14/19 8:32:00 EST, Full Resuscitation, Constant Order        FACE TO FACE PATIENT COUNSELLING / COORDINATING cARE MORE THAN 50% OF  ENCOUNTER TIME: YES    Total Encounter time: 65 minutes         observation.    *************************************        I was informed by RN patient had eloped. I attempted to call phone numbers in  chart, but many were not in service or were not his phone number.        please sent this dc summary to PCP for f/u of consideration for stress  echocardiogram.        Problem List/Past Medical History    Ongoing    Diabetes    Drug seeking behavior    Historical    No qualifying data    Procedure/Surgical History      * CABG    * chole    Social History    Smoking Status    Current every day smoker    Alcohol    Current    Substance Use \- Denies Substance Abuse    Tobacco    Current, Cigarettes    Family History    Allergies    Motrin (Hives, Anaphylactic reaction)    nitroglycerin    traMADOL    Toradol    aspirin    Medications    _Inpatient_    atorvastatin, 80 mg= 2 tab(s), PO, Daily    Dextrose 10% Water (hypoglycemia), 125 mL, IV Piggyback, ud, PRN    Dextrose 10% Water (hypoglycemia), 250 mL, IV Piggyback, ud, PRN    Dextrose 50% For Protocol, per protocol, IV Push, ud, PRN    glucagon, 1 mg= 1 EA, IM, ud, PRN    glucose 40% oral gel, 15 Gm= 39 mL, PO, ud, PRN    HumaLOG Low, 0-12 Unit(s), sc, QIDACHS    oxyCODONE, 5 mg= 1 tab(s), PO, q6hr, PRN    Protonix, 40 mg= 1 EA, IV Push, q24hr    Sodium Chloride 0.9% Flush, 3 mL, Flush, q8hr    Zofran, 4 mg= 2 mL, IV, q8hr, PRN    _Home_    aspirin 81 mg oral enteric coated tablet, 81 mg= 1 tab(s), PO, Daily    atorvastatin 40 mg oral tablet, 80 mg= 2 tab(s), PO, Daily    Metoprolol Tartrate 25 mg oral tablet, 25 mg= 1 tab(s), PO, BID    oxyCODONE 5 mg oral tablet, 5 mg= 1 tab(s), PO, q6hr, Partial fill upon  request    Diet    CHO Consistent Diet - Ordered    -- 06/14/19 8:32:00 EST, Room Service, Scheduled / PRN, 60gm/Meal    Lab Results    Glucose Lvl: 144 mg/dL High (16/10/96 04:54:09)    BUN: 15 mg/dL (81/19/14 78:29:56)    Creatinine: 1.16 mg/dL (21/30/86 57:84:69)    Afn Amer Glomerular Filtration Rate: 90  ml/min/1.27m2 (06/14/19 03:14:00)    Non-Afn Amer Glomerular Filtration Rate: 78 ml/min/1.71m2 (06/14/19 03:14:00)    Sodium Lvl: 141 mmol/L (06/14/19 03:14:00)    Potassium Lvl: 4.8 mmol/L (06/14/19 03:14:00)    Chloride: 107  mmol/L (06/14/19 03:14:00)    CO2: 29 mmol/L (06/14/19 03:14:00)    Anion Gap: 5 (06/14/19 03:14:00)    Total Protein: 7 Gm/dL (05/29/30 35:57:32)    Albumin Lvl: 3.4 Gm/dL (20/25/42 70:62:37)    Calcium Lvl: 8.9 mg/dL (62/83/15 17:61:60)    Bilirubin Total: 0.4 mg/dL (73/71/06 26:94:85)    Alkaline Phosphatase: 61 Units/L (06/14/19 03:14:00)    AST: 56 Units/L High (06/14/19 03:14:00)    ALT: 110 Units/L High (06/14/19 03:14:00)    Troponin I: <0.015 (06/14/19 03:14:00)    WBC: 7.4 thous/mm3 (06/14/19 03:14:00)    RBC: 4.31 Mil/mm3 (06/14/19 03:14:00)    Hgb: 12.8 Gm/dL Low (46/27/03 50:09:38)    Hct: 39.6 % (06/14/19 03:14:00)    Platelet: 222 thous/mm3 (06/14/19 03:14:00)    MCV: 91.9 fL (06/14/19 03:14:00)    MCH: 29.7 pGm (06/14/19 03:14:00)    MCHC: 32.3 Gm/dL (18/29/93 71:69:67)    RDW-SD: 46.4 fL (06/14/19 03:14:00)    MPV: 11.1 fL (06/14/19 03:14:00)    Absolute Neutro Count: 4.89 thous/mm3 (06/14/19 03:14:00)    Absolute Lymphs Count: 2 thous/mm3 (06/14/19 03:14:00)    Absolute Mono Count: 0.37 thous/mm3 (06/14/19 03:14:00)    Absolute Eos Count: 0.08 thous/mm3 (06/14/19 03:14:00)    Absolute Baso Count: 0.02 thous/mm3 (06/14/19 03:14:00)    Neutrophils: 66.2 % (06/14/19 03:14:00)    Lymphocytes: 27.1 % (06/14/19 03:14:00)    Monocytes: 5 % (06/14/19 03:14:00)    Eosinophils: 1.1 % (06/14/19 03:14:00)    Basophils: 0.3 % (06/14/19 03:14:00)    Immature Granulocytes: 0.3 % (06/14/19 03:14:00)    NRBC Percent: 0 % (06/14/19 03:14:00)    Absolute NRBC Count: 0 thous/mm3 (06/14/19 03:14:00)    Blue Top Tube To Hold: DONE (06/14/19 03:14:00)    Diagnostic Results        ------        [1] Chest pain (ED); Gwenevere Abbot MD, Cala Bradford 06/14/2019 06:57 EST    [2] CP; Maisie Fus MD, Alanson Aly 02/09/2019  14:08 EDT    SIGNATURE LINE Electronically signed by Hoy Register MD, Deyonte Cadden on 06/14/2019 at  14:30:55 EST

## 2019-06-18 ENCOUNTER — Emergency Department: Admit: 2019-06-19 | Payer: PRIVATE HEALTH INSURANCE | Primary: Family Medicine

## 2019-06-18 ENCOUNTER — Emergency Department: Payer: PRIVATE HEALTH INSURANCE | Primary: Family Medicine

## 2019-06-18 DIAGNOSIS — R079 Chest pain, unspecified: Secondary | ICD-10-CM

## 2019-06-18 NOTE — ED Notes (Signed)
Pt requesting something for pain and nausea.

## 2019-06-18 NOTE — ED Notes (Signed)
9/10 L upper chest pain started while at work approx one hour ago. Pain radiates into L jaw and down L arm. Hx MI x2 and CABG. Last MI one year ago. Received Fentanyl and 324mg  ASA by EMS.

## 2019-06-18 NOTE — ED Provider Notes (Signed)
44 year old male presents via ambulance.  Chief complaint is chest pain.  Onset of pain about 1 hour ago.  The patient was just sitting at his desk, finishing a shift.  He was finishing his paperwork, not exerting himself.  He describes a crushing pressure sensation on the left side of his chest, radiating to his left bicep, left shoulder, left neck area.  He states this feels quite similar to all of his previous MIs.  He has had multiple MI's, 1st at age 19. He has had CABG x2.  There is no lightheadedness or dizziness.  No shortness of breath.  EMS did not give nitroglycerin as the patient has been advised that he should not take it because it makes him ???pass out.???  The patient smokes 6-7 cigarettes per day.  He has a very significant family history of coronary artery disease.  He is compliant with his medications, only taking aspirin, Lipitor, metoprolol.    The history is provided by the patient and the EMS personnel.   Chest Pain (Angina)   This is a chronic problem. The problem has not changed since onset.The problem occurs constantly. The pain is moderate. Associated symptoms include nausea. Pertinent negatives include no back pain, no cough, no diaphoresis, no dizziness, no fever, no headaches, no leg pain, no lower extremity edema, no malaise/fatigue, no near-syncope, no palpitations, no shortness of breath, no vomiting and no weakness. Risk factors include family history, smoking/tobacco exposure, cardiac disease and hypertension. Procedural history includes cardiac catheterization, echocardiogram, stress echo, angioplasty and CABG.       Past Medical History:   Diagnosis Date   ??? CAD (coronary artery disease)     MI x 3   ??? Cardiac revascularization with aortocoronary bypass anastomosis    ??? Chronic dental pain     - multiple teeth remove   ??? Chronic knee pain    ??? Hypertension    ??? Myocardial infarct [facility])    ??? S/P CABG x 1 2007    CABG x 1, 2007, due to congenital heart disease--patient's explanation is  that right coronary artery was never in the proper location (perfused left side of heart only) and he had repeat cabage surgery in 2012 to correct this.   ??? Thromboembolus [facility])        Past Surgical History:   Procedure Laterality Date   ??? HX CHOLECYSTECTOMY  08/2010        ??? HX CORONARY ARTERY BYPASS GRAFT  2007, 2012    2007: due to congenital heart disease - patients explanation is that right coronary artery was never in the proper location (perfused left side of heart only) and had repeat CABG surgery in 2012 to correct this   ??? HX OTHER SURGICAL  11/30/2013    TOOTH EXTRACTION         Family History:   Problem Relation Age of Onset   ??? Heart Disease Mother    ??? Cancer Mother         BRAIN CANCER   ??? Diabetes Father    ??? Diabetes Brother    ??? Heart Disease Maternal Grandmother    ??? Dementia Maternal Grandmother    ??? Heart Disease Maternal Grandfather    ??? Heart Disease Paternal Grandmother    ??? Heart Disease Paternal Grandfather    ??? No Known Problems Other    ??? Heart Disease Brother        Social History     Socioeconomic History   ???  Marital status: SINGLE     Spouse name: Not on file   ??? Number of children: 0   ??? Years of education: Not on file   ??? Highest education level: Not on file   Occupational History     Comment: DISABILITY   Social Needs   ??? Financial resource strain: Not on file   ??? Food insecurity     Worry: Not on file     Inability: Not on file   ??? Transportation needs     Medical: Not on file     Non-medical: Not on file   Tobacco Use   ??? Smoking status: Current Some Day Smoker     Packs/day: 0.25     Years: 30.00     Pack years: 7.50     Types: Cigarettes   ??? Smokeless tobacco: Former Systems developer   ??? Tobacco comment: About 40-50 pack years, used to smoke 2 PPD but now down to 2 cigarettes a day   Substance and Sexual Activity   ??? Alcohol use: No   ??? Drug use: No   ??? Sexual activity: Not on file   Lifestyle   ??? Physical activity     Days per week: Not on file     Minutes per session: Not on file   ???  Stress: Not on file   Relationships   ??? Social Product manager on phone: Not on file     Gets together: Not on file     Attends religious service: Not on file     Active member of club or organization: Not on file     Attends meetings of clubs or organizations: Not on file     Relationship status: Not on file   ??? Intimate partner violence     Fear of current or ex partner: Not on file     Emotionally abused: Not on file     Physically abused: Not on file     Forced sexual activity: Not on file   Other Topics Concern   ??? Military Service Not Asked   ??? Blood Transfusions Not Asked   ??? Caffeine Concern Not Asked   ??? Occupational Exposure Not Asked   ??? Hobby Hazards Not Asked   ??? Sleep Concern Not Asked   ??? Stress Concern Not Asked   ??? Weight Concern Not Asked   ??? Special Diet Not Asked   ??? Back Care Not Asked   ??? Exercise Not Asked   ??? Bike Helmet Not Asked   ??? Seat Belt Not Asked   ??? Self-Exams Not Asked   Social History Narrative    He is from [place] but moved to [place] at age 89.  Has been between [place] and [place] with wife, married since 2016     No children    Disability secondary to heart disease and syncopal episode    About 40-50 pack years, used to smoke 2 PPD but now down to 2 cigarettes a day    No EtOH    No drug use         ALLERGIES: Coconut, Codeine, Morphine, Motrin [ibuprofen], Toradol [ketorolac], Tramadol, Iodinated contrast media, and Nitroglycerin    Review of Systems   Constitutional: Negative for diaphoresis, fever and malaise/fatigue.   Respiratory: Negative for cough and shortness of breath.    Cardiovascular: Positive for chest pain. Negative for palpitations and near-syncope.   Gastrointestinal: Positive for  nausea. Negative for vomiting.   Musculoskeletal: Negative for back pain.   Neurological: Negative for dizziness, weakness and headaches.   All other systems reviewed and are negative.      Vitals:    06/18/19 2255 06/18/19 2300   BP: (!) 163/78 136/78   Pulse: 62  63   Resp: 16    Temp: 98.6 ??F (37 ??C)    SpO2: 99%    Weight: 122.5 kg (270 lb)    Height: 6\' 4"  (1.93 m)             Physical Exam  Vitals signs and nursing note reviewed.   Constitutional:       General: He is not in acute distress.     Appearance: Normal appearance. He is well-developed. He is obese. He is not ill-appearing, toxic-appearing or diaphoretic.      Comments: At the time of exam the patient states his pain is 5/10.  He does not appear to be uncomfortable.  Vital signs are normal.  Oxygen saturation is 99% on room air.   HENT:      Head: Normocephalic.      Right Ear: External ear normal.      Left Ear: External ear normal.      Nose: Nose normal. No congestion or rhinorrhea.      Mouth/Throat:      Mouth: Mucous membranes are moist.      Pharynx: Oropharynx is clear.   Eyes:      Extraocular Movements: Extraocular movements intact.      Pupils: Pupils are equal, round, and reactive to light.   Neck:      Musculoskeletal: Normal range of motion.   Cardiovascular:      Rate and Rhythm: Normal rate and regular rhythm.      Heart sounds: Normal heart sounds. No murmur.   Pulmonary:      Effort: Pulmonary effort is normal. No respiratory distress.      Breath sounds: Normal breath sounds. No wheezing.   Abdominal:      General: Bowel sounds are normal.      Palpations: Abdomen is soft.      Tenderness: There is no abdominal tenderness.   Musculoskeletal: Normal range of motion.         General: No tenderness or deformity.      Right lower leg: No edema.      Left lower leg: No edema.   Skin:     General: Skin is warm and dry.      Coloration: Skin is not jaundiced or pale.      Findings: No rash.   Neurological:      General: No focal deficit present.      Mental Status: He is alert and oriented to person, place, and time.      Cranial Nerves: No cranial nerve deficit.      Motor: No weakness.      Coordination: Coordination normal.      Gait: Gait normal.      Deep Tendon Reflexes: Reflexes are normal  and symmetric.   Psychiatric:         Mood and Affect: Mood normal.         Behavior: Behavior normal.         Thought Content: Thought content normal.          MDM  Number of Diagnoses or Management Options  Diagnosis management comments: 44 year old male presents with left-sided chest pain, rather  convincing for potential cardiac origin.  He has significant risk factors, significant past medical history.  On exam the patient does not appear to be uncomfortable or in any acute distress.  He is intolerant of nitroglycerin and therefore this cannot be given.  He is allergic to morphine.  He will be given Dilaudid and Zofran.  EKG shows sinus rhythm, bradycardic, rate 59.  There is no ST elevation or depression, no ectopy.  No changes from previous, dating 04/03/2019.  Chest x-ray and labs will be obtained.  The patient will be reassessed.    ED Course as of Jun 19 1406   Thu Jun 18, 2019   2341 Report given to the overnight EDP, Dr. Nedra Hai.    [PD]      ED Course User Index  [PD] Glendon Axe., PA       Procedures      NIH Stroke Scale

## 2019-06-18 NOTE — ED Triage Notes (Signed)
9/10 L upper chest pain started while at work approx one hour ago. Pain radiates into L jaw and down L arm. Hx MI x2 and CABG. Last MI one year ago. Received 50mcg Fentanyl and 324mg ASA by EMS.

## 2019-06-18 NOTE — ED Provider Notes (Signed)
44 year old male presents via ambulance.  Chief complaint is chest pain.  Onset of pain about 1 hour ago.  The patient was just sitting at his desk, finishing a shift.  He was finishing his paperwork, not exerting himself.  He describes a crushing pressure sensation on the left side of his chest, radiating to his left bicep, left shoulder, left neck area.  He states this feels quite similar to all of his previous MIs.  He has had multiple MI's, 1st at age 29. He has had CABG x2.  There is no lightheadedness or dizziness.  No shortness of breath.  EMS did not give nitroglycerin as the patient has been advised that he should not take it because it makes him ???pass out.???  The patient smokes 6-7 cigarettes per day.  He has a very significant family history of coronary artery disease.  He is compliant with his medications, only taking aspirin, Lipitor, metoprolol.    The history is provided by the patient and the EMS personnel.   Chest Pain (Angina)   This is a chronic problem. The problem has not changed since onset.The problem occurs constantly. The pain is moderate. Associated symptoms include nausea. Pertinent negatives include no back pain, no cough, no diaphoresis, no dizziness, no fever, no headaches, no leg pain, no lower extremity edema, no malaise/fatigue, no near-syncope, no palpitations, no shortness of breath, no vomiting and no weakness. Risk factors include family history, smoking/tobacco exposure, cardiac disease and hypertension. Procedural history includes cardiac catheterization, echocardiogram, stress echo, angioplasty and CABG.       Past Medical History:   Diagnosis Date   ??? CAD (coronary artery disease)     MI x 3   ??? Cardiac revascularization with aortocoronary bypass anastomosis    ??? Chronic dental pain     - multiple teeth remove   ??? Chronic knee pain    ??? Hypertension    ??? Myocardial infarct [facility])    ??? S/P CABG x 1 2007     CABG x 1, 2007, due to congenital heart disease--patient's explanation is that right coronary artery was never in the proper location (perfused left side of heart only) and he had repeat cabage surgery in 2012 to correct this.   ??? Thromboembolus [facility])        Past Surgical History:   Procedure Laterality Date   ??? HX CHOLECYSTECTOMY  08/2010        ??? HX CORONARY ARTERY BYPASS GRAFT  2007, 2012    2007: due to congenital heart disease - patients explanation is that right coronary artery was never in the proper location (perfused left side of heart only) and had repeat CABG surgery in 2012 to correct this   ??? HX OTHER SURGICAL  11/30/2013    TOOTH EXTRACTION         Family History:   Problem Relation Age of Onset   ??? Heart Disease Mother    ??? Cancer Mother         BRAIN CANCER   ??? Diabetes Father    ??? Diabetes Brother    ??? Heart Disease Maternal Grandmother    ??? Dementia Maternal Grandmother    ??? Heart Disease Maternal Grandfather    ??? Heart Disease Paternal Grandmother    ??? Heart Disease Paternal Grandfather    ??? No Known Problems Other    ??? Heart Disease Brother        Social History     Socioeconomic History   ???  Marital status: SINGLE     Spouse name: Not on file   ??? Number of children: 0   ??? Years of education: Not on file   ??? Highest education level: Not on file   Occupational History     Comment: DISABILITY   Social Needs   ??? Financial resource strain: Not on file   ??? Food insecurity     Worry: Not on file     Inability: Not on file   ??? Transportation needs     Medical: Not on file     Non-medical: Not on file   Tobacco Use   ??? Smoking status: Current Some Day Smoker     Packs/day: 0.25     Years: 30.00     Pack years: 7.50     Types: Cigarettes   ??? Smokeless tobacco: Former Systems developer   ??? Tobacco comment: About 40-50 pack years, used to smoke 2 PPD but now down to 2 cigarettes a day   Substance and Sexual Activity   ??? Alcohol use: No   ??? Drug use: No   ??? Sexual activity: Not on file   Lifestyle   ??? Physical activity      Days per week: Not on file     Minutes per session: Not on file   ??? Stress: Not on file   Relationships   ??? Social Product manager on phone: Not on file     Gets together: Not on file     Attends religious service: Not on file     Active member of club or organization: Not on file     Attends meetings of clubs or organizations: Not on file     Relationship status: Not on file   ??? Intimate partner violence     Fear of current or ex partner: Not on file     Emotionally abused: Not on file     Physically abused: Not on file     Forced sexual activity: Not on file   Other Topics Concern   ??? Military Service Not Asked   ??? Blood Transfusions Not Asked   ??? Caffeine Concern Not Asked   ??? Occupational Exposure Not Asked   ??? Hobby Hazards Not Asked   ??? Sleep Concern Not Asked   ??? Stress Concern Not Asked   ??? Weight Concern Not Asked   ??? Special Diet Not Asked   ??? Back Care Not Asked   ??? Exercise Not Asked   ??? Bike Helmet Not Asked   ??? Seat Belt Not Asked   ??? Self-Exams Not Asked   Social History Narrative    He is from [place] but moved to [place] at age 9.  Has been between [place] and [place] with wife, married since 2016     No children    Disability secondary to heart disease and syncopal episode    About 40-50 pack years, used to smoke 2 PPD but now down to 2 cigarettes a day    No EtOH    No drug use         ALLERGIES: Coconut, Codeine, Morphine, Motrin [ibuprofen], Toradol [ketorolac], Tramadol, Iodinated contrast media, and Nitroglycerin    Review of Systems   Constitutional: Negative for diaphoresis, fever and malaise/fatigue.   Respiratory: Negative for cough and shortness of breath.    Cardiovascular: Positive for chest pain. Negative for palpitations and near-syncope.   Gastrointestinal: Positive for  nausea. Negative for vomiting.   Musculoskeletal: Negative for back pain.   Neurological: Negative for dizziness, weakness and headaches.   All other systems reviewed and are negative.       Vitals:    06/18/19 2255 06/18/19 2300   BP: (!) 163/78 136/78   Pulse: 62 63   Resp: 16    Temp: 98.6 ??F (37 ??C)    SpO2: 99%    Weight: 122.5 kg (270 lb)    Height: 6\' 4"  (1.93 m)             Physical Exam  Vitals signs and nursing note reviewed.   Constitutional:       General: He is not in acute distress.     Appearance: Normal appearance. He is well-developed. He is obese. He is not ill-appearing, toxic-appearing or diaphoretic.      Comments: At the time of exam the patient states his pain is 5/10.  He does not appear to be uncomfortable.  Vital signs are normal.  Oxygen saturation is 99% on room air.   HENT:      Head: Normocephalic.      Right Ear: External ear normal.      Left Ear: External ear normal.      Nose: Nose normal. No congestion or rhinorrhea.      Mouth/Throat:      Mouth: Mucous membranes are moist.      Pharynx: Oropharynx is clear.   Eyes:      Extraocular Movements: Extraocular movements intact.      Pupils: Pupils are equal, round, and reactive to light.   Neck:      Musculoskeletal: Normal range of motion.   Cardiovascular:      Rate and Rhythm: Normal rate and regular rhythm.      Heart sounds: Normal heart sounds. No murmur.   Pulmonary:      Effort: Pulmonary effort is normal. No respiratory distress.      Breath sounds: Normal breath sounds. No wheezing.   Abdominal:      General: Bowel sounds are normal.      Palpations: Abdomen is soft.      Tenderness: There is no abdominal tenderness.   Musculoskeletal: Normal range of motion.         General: No tenderness or deformity.      Right lower leg: No edema.      Left lower leg: No edema.   Skin:     General: Skin is warm and dry.      Coloration: Skin is not jaundiced or pale.      Findings: No rash.   Neurological:      General: No focal deficit present.      Mental Status: He is alert and oriented to person, place, and time.      Cranial Nerves: No cranial nerve deficit.      Motor: No weakness.       Coordination: Coordination normal.      Gait: Gait normal.      Deep Tendon Reflexes: Reflexes are normal and symmetric.   Psychiatric:         Mood and Affect: Mood normal.         Behavior: Behavior normal.         Thought Content: Thought content normal.          MDM  Number of Diagnoses or Management Options  Diagnosis management comments: 44 year old male presents with left-sided chest pain, rather  convincing for potential cardiac origin.  He has significant risk factors, significant past medical history.  On exam the patient does not appear to be uncomfortable or in any acute distress.  He is intolerant of nitroglycerin and therefore this cannot be given.  He is allergic to morphine.  He will be given Dilaudid and Zofran.  EKG shows sinus rhythm, bradycardic, rate 59.  There is no ST elevation or depression, no ectopy.  No changes from previous, dating 04/03/2019.  Chest x-ray and labs will be obtained.  The patient will be reassessed.    ED Course as of Jun 19 1406   Thu Jun 18, 2019   2341 Report given to the overnight EDP, Dr. Nedra Hai.    [PD]      ED Course User Index  [PD] Glendon Axe., PA       Procedures      NIH Stroke Scale

## 2019-06-18 NOTE — ED Notes (Signed)
Pt requesting something for pain and nausea.

## 2019-06-19 ENCOUNTER — Observation Stay: Payer: PRIVATE HEALTH INSURANCE | Primary: Family Medicine

## 2019-06-19 ENCOUNTER — Inpatient Hospital Stay
Admit: 2019-06-19 | Discharge: 2019-06-19 | Discharge status: Against Medical Advice | Payer: PRIVATE HEALTH INSURANCE | Attending: Physician Assistant

## 2019-06-19 LAB — RESPIRATORY PANEL BASIC CARE EVERYWHERE
COVID-19 CARE EVERYWHERE: NEGATIVE
INFLUENZA A PCR CARE EVERYWHERE: NEGATIVE
INFLUENZA B PCR CARE EVERYWHERE: NEGATIVE
RSV PCR CARE EVERYWHERE: NEGATIVE

## 2019-06-19 LAB — HEMOGLOBIN A1C CARE EVERYWHERE
ESTIMATED AVERAGE GLUCOSE CARE EVERYWHERE: 192 mg/dL
HEMOGLOBIN A1C CARE EVERYWHERE: 8.3 % — ABNORMAL HIGH (ref 4.2–6.3)

## 2019-06-19 LAB — CBC WITH AUTO DIFFERENTIAL
Basophils %: 0 % (ref 0–1.2)
Basophils Absolute: 0 10*3/uL (ref 0.0–0.1)
Eosinophils %: 2 %
Eosinophils Absolute: 0.1 10*3/uL (ref 0.0–0.5)
Granulocyte Absolute Count: 0 10*3/uL (ref 0–0.03)
Hematocrit: 42.1 % (ref 40.1–51.0)
Hemoglobin: 13.8 g/dL (ref 13.7–17.5)
Immature Granulocytes: 0 % (ref 0–0.4)
Lymphocytes %: 42 %
Lymphocytes Absolute: 2.1 10*3/uL (ref 1.2–3.7)
MCH: 30.7 PG (ref 25.6–32.2)
MCHC: 32.8 g/dL (ref 32.2–35.5)
MCV: 93.6 FL (ref 80.0–95.0)
MPV: 10.5 FL (ref 9.4–12.4)
Monocytes %: 6 %
Monocytes Absolute: 0.3 10*3/uL (ref 0.2–0.8)
Neutrophils %: 50 %
Neutrophils Absolute: 2.5 10*3/uL (ref 1.6–6.1)
Platelets: 214 10*3/uL (ref 150–400)
RBC: 4.5 M/uL — ABNORMAL LOW (ref 4.51–5.93)
RDW: 13.5 % (ref 11.6–14.4)
WBC: 5 10*3/uL (ref 4.0–10.0)

## 2019-06-19 LAB — COMPREHENSIVE METABOLIC PANEL
ALT: 80 U/L — ABNORMAL HIGH (ref 16–61)
AST: 45 U/L — ABNORMAL HIGH (ref 15–37)
Albumin/Globulin Ratio: 0.8 — ABNORMAL LOW (ref 1.0–3.0)
Albumin: 3.1 g/dL — ABNORMAL LOW (ref 3.4–5.0)
Alkaline Phosphatase: 55 U/L (ref 45–117)
Anion Gap: 6 mmol/L (ref 5–15)
BUN: 8 MG/DL (ref 7–18)
Bun/Cre Ratio: 9 NA — ABNORMAL LOW (ref 12–20)
CO2: 26 mmol/L (ref 21–32)
Calcium: 9 MG/DL (ref 8.5–10.1)
Chloride: 108 mmol/L (ref 98–110)
Creatinine: 0.89 MG/DL (ref 0.70–1.30)
EGFR IF NonAfrican American: >60 mL/min/{1.73_m2} (ref >60)
GFR African American: >60 mL/min/{1.73_m2} (ref >60)
Glucose: 93 mg/dL (ref 70–100)
Potassium: 4.1 mmol/L (ref 3.5–5.1)
Sodium: 140 mmol/L (ref 136–145)
Total Bilirubin: 0.32 mg/dL (ref 0.00–1.20)
Total Protein: 6.8 g/dL (ref 6.4–8.2)

## 2019-06-19 LAB — EKG 12-LEAD
Atrial Rate: 54 ms
Atrial Rate: 57 ms
ECG QTC Interval: 394 ms
ECG QTC Interval: 409 ms
EKG I-40 FRONT AXIS: 11 deg
EKG I-40 FRONT AXIS: 20 deg
EKG I-40 HORIZONTAL AXIS: 48 deg
EKG I-40 HORIZONTAL AXIS: 58 deg
EKG P DURATION: 121 ms
EKG P DURATION: 128 ms
EKG P FRONT AXIS: 28 deg
EKG P FRONT AXIS: 45 deg
EKG P HORIZONTAL AXIS: -10 deg
EKG P HORIZONTAL AXIS: -15 deg
EKG Q ONSET: 499 ms
EKG Q ONSET: 499 ms
EKG QRS AXIS: 47 deg
EKG QRS AXIS: 72 deg
EKG QRS HORIZONTAL AXIS: -33 deg
EKG QRS HORIZONTAL AXIS: -37 deg
EKG QRSD INTERVAL: 95 ms
EKG QRSD INTERVAL: 99 ms
EKG QTCB: 384 ms
EKG QTCB: 391 ms
EKG QTCF: 392 ms
EKG QTCF: 392 ms
EKG RR INTERVAL: 1017 ms
EKG RR INTERVAL: 1132 ms
EKG S-T FRONT AXIS: 42 deg
EKG S-T FRONT AXIS: 60 deg
EKG S-T HORIZONTAL AXIS: 60 deg
EKG S-T HORIZONTAL AXIS: 70 deg
EKG T HORIZONTAL AXIS: 44 deg
EKG T HORIZONTAL AXIS: 47 deg
EKG T WAVE AXIS: 56 deg
EKG T WAVE AXIS: 60 deg
EKG T-40 FRONT AXIS: 117 deg
EKG T-40 FRONT AXIS: 77 deg
EKG T-40 HORIZONTAL AXIS: -52 deg
EKG T-40 HORIZONTAL AXIS: -60 deg
Heart Rate: 53 {beats}/min
Heart Rate: 59 {beats}/min
P-R Interval: 174 ms
P-R Interval: 183 ms

## 2019-06-19 LAB — COVID-19 & INFLUENZA COMBO
RSV By PCR: NEGATIVE
Rapid Influenza A By PCR: NEGATIVE
Rapid Influenza B By PCR: NEGATIVE
SARS-CoV-2: NEGATIVE

## 2019-06-19 LAB — HEMOGLOBIN A1C W/EAG
Hemoglobin A1C: 8.3 % — ABNORMAL HIGH (ref 4.2–6.3)
eAG: 192 mg/dL

## 2019-06-19 LAB — TROPONIN
Troponin I: <0.015 ng/mL (ref 0.000–0.040)
Troponin I: <0.015 ng/mL (ref 0.000–0.040)
Troponin I: <0.015 ng/mL (ref 0.000–0.040)

## 2019-06-19 LAB — POCT GLUCOSE
POC Glucose: 109 mg/dL — ABNORMAL HIGH (ref 70–105)
POC Glucose: 159 mg/dL — ABNORMAL HIGH (ref 70–105)

## 2019-06-19 LAB — EKG, 12 LEAD, INITIAL
Atrial Rate: 54 ms
Atrial Rate: 57 ms
Heart Rate: 53 {beats}/min
Heart Rate: 59 {beats}/min
I-40 Front Axis: 11 deg
I-40 Front Axis: 20 deg
I-40 Horizontal Axis: 48 deg
I-40 Horizontal Axis: 58 deg
P Duration: 121 ms
P Duration: 128 ms
P Front Axis: 28 deg
P Front Axis: 45 deg
P Horizontal Axis: -10 deg
P Horizontal Axis: -15 deg
P-R Interval: 174 ms
P-R Interval: 183 ms
Q Onset: 499 ms
Q Onset: 499 ms
QRS Axis: 47 deg
QRS Axis: 72 deg
QRS Horizontal Axis: -33 deg
QRS Horizontal Axis: -37 deg
QRSD Interval: 95 ms
QRSD Interval: 99 ms
QT Interval: 394 ms
QT Interval: 409 ms
QTcB: 384 ms
QTcB: 391 ms
QTcF: 392 ms
QTcF: 392 ms
RR Interval: 1017 ms
RR Interval: 1132 ms
S-T Front Axis: 42 deg
S-T Front Axis: 60 deg
S-T Horizontal Axis: 60 deg
S-T Horizontal Axis: 70 deg
T Horizontal Axis: 44 deg
T Horizontal Axis: 47 deg
T Wave Axis: 56 deg
T Wave Axis: 60 deg
T-40 Front Axis: 117 deg
T-40 Front Axis: 77 deg
T-40 Horizontal Axis: -52 deg
T-40 Horizontal Axis: -60 deg

## 2019-06-19 LAB — CBC WITH AUTOMATED DIFF
ABS. BASOPHILS: 0 10*3/uL (ref 0.0–0.1)
ABS. EOSINOPHILS: 0.1 10*3/uL (ref 0.0–0.5)
ABS. IMM. GRANS.: 0 10*3/uL (ref 0–0.03)
ABS. LYMPHOCYTES: 2.1 10*3/uL (ref 1.2–3.7)
ABS. MONOCYTES: 0.3 10*3/uL (ref 0.2–0.8)
ABS. NEUTROPHILS: 2.5 10*3/uL (ref 1.6–6.1)
BASOPHILS: 0 % (ref 0–1.2)
EOSINOPHILS: 2 %
HCT: 42.1 % (ref 40.1–51.0)
HGB: 13.8 g/dL (ref 13.7–17.5)
IMMATURE GRANULOCYTES: 0 % (ref 0–0.4)
LYMPHOCYTES: 42 %
MCH: 30.7 PG (ref 25.6–32.2)
MCHC: 32.8 g/dL (ref 32.2–35.5)
MCV: 93.6 FL (ref 80.0–95.0)
MONOCYTES: 6 %
MPV: 10.5 FL (ref 9.4–12.4)
NEUTROPHILS: 50 %
PLATELET: 214 10*3/uL (ref 150–400)
RBC: 4.5 M/uL — ABNORMAL LOW (ref 4.51–5.93)
RDW: 13.5 % (ref 11.6–14.4)
WBC: 5 10*3/uL (ref 4.0–10.0)

## 2019-06-19 LAB — COVID-19,INFLUENZA A/B,RSV PANEL
Influenza A by PCR: NEGATIVE
Influenza B by PCR: NEGATIVE
RSV by PCR: NEGATIVE
SARS-CoV-2 by PCR: NEGATIVE

## 2019-06-19 LAB — GLUCOSE, POC
Glucose (POC): 109 mg/dL — ABNORMAL HIGH (ref 70–105)
Glucose (POC): 159 mg/dL — ABNORMAL HIGH (ref 70–105)

## 2019-06-19 LAB — METABOLIC PANEL, COMPREHENSIVE
A-G Ratio: 0.8 — ABNORMAL LOW (ref 1.0–3.0)
ALT (SGPT): 80 U/L — ABNORMAL HIGH (ref 16–61)
AST (SGOT): 45 U/L — ABNORMAL HIGH (ref 15–37)
Albumin: 3.1 g/dL — ABNORMAL LOW (ref 3.4–5.0)
Alk. phosphatase: 55 U/L (ref 45–117)
Anion gap: 6 mmol/L (ref 5–15)
BUN/Creatinine ratio: 9 — ABNORMAL LOW (ref 12–20)
BUN: 8 MG/DL (ref 7–18)
Bilirubin, total: 0.32 mg/dL (ref 0.00–1.20)
CO2: 26 mmol/L (ref 21–32)
Calcium: 9 MG/DL (ref 8.5–10.1)
Chloride: 108 mmol/L (ref 98–110)
Creatinine: 0.89 MG/DL (ref 0.70–1.30)
GFR est AA: >60 mL/min/{1.73_m2} (ref >60)
GFR est non-AA: >60 mL/min/{1.73_m2} (ref >60)
Glucose: 93 mg/dL (ref 70–100)
Potassium: 4.1 mmol/L (ref 3.5–5.1)
Protein, total: 6.8 g/dL (ref 6.4–8.2)
Sodium: 140 mmol/L (ref 136–145)

## 2019-06-19 LAB — TROPONIN I
Troponin-I, Qt.: <0.015 ng/mL (ref 0.000–0.040)
Troponin-I, Qt.: <0.015 ng/mL (ref 0.000–0.040)
Troponin-I, Qt.: <0.015 ng/mL (ref 0.000–0.040)

## 2019-06-19 LAB — HEMOGLOBIN A1C WITH EAG
Est. average glucose: 192 mg/dL
Hemoglobin A1c: 8.3 % — ABNORMAL HIGH (ref 4.2–6.3)

## 2019-06-19 MED ORDER — ATORVASTATIN 40 MG TAB
40 mg | Freq: Every evening | ORAL | Status: DC
Start: 2019-06-19 — End: 2019-06-19

## 2019-06-19 MED ORDER — HYDROMORPHONE 0.5 MG/0.5 ML SYRINGE
0.5 mg/ mL | Freq: Once | INTRAMUSCULAR | Status: AC
Start: 2019-06-19 — End: 2019-06-19
  Administered 2019-06-19: 07:00:00 via INTRAVENOUS

## 2019-06-19 MED ORDER — SODIUM CHLORIDE 0.9 % IJ SYRG
Freq: Three times a day (TID) | INTRAMUSCULAR | Status: DC
Start: 2019-06-19 — End: 2019-06-19
  Administered 2019-06-19: 04:00:00 via INTRAVENOUS

## 2019-06-19 MED ORDER — SODIUM CHLORIDE 0.9 % IJ SYRG
INTRAMUSCULAR | Status: DC | PRN
Start: 2019-06-19 — End: 2019-06-19

## 2019-06-19 MED ORDER — NALOXONE 0.4 MG/ML INJECTION
0.4 mg/mL | INTRAMUSCULAR | Status: DC | PRN
Start: 2019-06-19 — End: 2019-06-19

## 2019-06-19 MED ORDER — METOPROLOL SUCCINATE SR 50 MG 24 HR TAB
50 mg | Freq: Every day | ORAL | Status: DC
Start: 2019-06-19 — End: 2019-06-19

## 2019-06-19 MED ORDER — MECLIZINE 12.5 MG TAB
12.5 mg | Freq: Four times a day (QID) | ORAL | Status: DC | PRN
Start: 2019-06-19 — End: 2019-06-19
  Administered 2019-06-19: 15:00:00 via ORAL

## 2019-06-19 MED ORDER — ONDANSETRON 4 MG TAB, RAPID DISSOLVE
4 mg | Freq: Four times a day (QID) | ORAL | Status: DC | PRN
Start: 2019-06-19 — End: 2019-06-19

## 2019-06-19 MED ORDER — SODIUM CHLORIDE 0.9 % IJ SYRG
Freq: Two times a day (BID) | INTRAMUSCULAR | Status: DC
Start: 2019-06-19 — End: 2019-06-19
  Administered 2019-06-19: 08:00:00 via INTRAVENOUS

## 2019-06-19 MED ORDER — HYDROMORPHONE 0.5 MG/0.5 ML SYRINGE
0.5 mg/ mL | Freq: Once | INTRAMUSCULAR | Status: AC
Start: 2019-06-19 — End: 2019-06-18
  Administered 2019-06-19: 05:00:00 via INTRAVENOUS

## 2019-06-19 MED ORDER — ONDANSETRON (PF) 4 MG/2 ML INJECTION
4 mg/2 mL | INTRAMUSCULAR | Status: AC
Start: 2019-06-19 — End: 2019-06-18
  Administered 2019-06-19: 05:00:00 via INTRAVENOUS

## 2019-06-19 MED ORDER — ASPIRIN 81 MG CHEWABLE TAB
81 mg | Freq: Every day | ORAL | Status: DC
Start: 2019-06-19 — End: 2019-06-19
  Administered 2019-06-19: 15:00:00 via ORAL

## 2019-06-19 MED ORDER — SODIUM CHLORIDE 0.9 % IV
INTRAVENOUS | Status: AC
Start: 2019-06-19 — End: 2019-06-19

## 2019-06-19 MED ORDER — ACETAMINOPHEN (TYLENOL) SOLUTION 32MG/ML
ORAL | Status: DC | PRN
Start: 2019-06-19 — End: 2019-06-19

## 2019-06-19 MED ORDER — ACETAMINOPHEN 650 MG RECTAL SUPPOSITORY
650 mg | RECTAL | Status: DC | PRN
Start: 2019-06-19 — End: 2019-06-19

## 2019-06-19 MED ORDER — NITROGLYCERIN 2 % TRANSDERMAL OINTMENT
2 % | TRANSDERMAL | Status: DC
Start: 2019-06-19 — End: 2019-06-19

## 2019-06-19 MED ORDER — ACETAMINOPHEN 325 MG TABLET
325 mg | ORAL | Status: DC | PRN
Start: 2019-06-19 — End: 2019-06-19

## 2019-06-19 MED ORDER — HEPARIN (PORCINE) 5,000 UNIT/ML IJ SOLN
5000 unit/mL | Freq: Two times a day (BID) | INTRAMUSCULAR | Status: DC
Start: 2019-06-19 — End: 2019-06-19

## 2019-06-19 MED ORDER — HYDROMORPHONE 2 MG TAB
2 mg | ORAL | Status: DC | PRN
Start: 2019-06-19 — End: 2019-06-18

## 2019-06-19 MED ORDER — ONDANSETRON (PF) 4 MG/2 ML INJECTION
4 mg/2 mL | Freq: Four times a day (QID) | INTRAMUSCULAR | Status: DC | PRN
Start: 2019-06-19 — End: 2019-06-19

## 2019-06-19 MED FILL — DILAUDID (PF) 0.5 MG/0.5 ML INJECTION SYRINGE: 0.5 mg/ mL | INTRAMUSCULAR | Qty: 0.5

## 2019-06-19 MED FILL — NORMAL SALINE FLUSH 0.9 % INJECTION SYRINGE: INTRAMUSCULAR | Qty: 10

## 2019-06-19 MED FILL — SODIUM CHLORIDE 0.9 % IV: INTRAVENOUS | Qty: 1000

## 2019-06-19 MED FILL — MECLIZINE 12.5 MG TAB: 12.5 mg | ORAL | Qty: 2

## 2019-06-19 MED FILL — METOPROLOL SUCCINATE SR 50 MG 24 HR TAB: 50 mg | ORAL | Qty: 1

## 2019-06-19 MED FILL — ONDANSETRON (PF) 4 MG/2 ML INJECTION: 4 mg/2 mL | INTRAMUSCULAR | Qty: 2

## 2019-06-19 MED FILL — ASPIRIN 81 MG CHEWABLE TAB: 81 mg | ORAL | Qty: 4

## 2019-06-19 MED FILL — HEPARIN (PORCINE) 5,000 UNIT/ML IJ SOLN: 5000 unit/mL | INTRAMUSCULAR | Qty: 1

## 2019-06-19 MED FILL — NITRO-BID 2 % TRANSDERMAL OINTMENT: 2 % | TRANSDERMAL | Qty: 1

## 2019-06-19 NOTE — Progress Notes (Signed)
Hospitalist Progress Note    NAME:  Dustin Carney   DOB:   [birth year]   MRN:   [id number]     Date of Service: 06/19/2019      Subjective:   Complaining of chest pain which was radiating to neck and arm.  Patient appears comfortable in bed.  Refused stress test this a.m.    Objective:   Vitals:  Visit Vitals  BP 132/82 (BP 1 Location: Left upper arm, BP Patient Position: Supine)   Pulse 62   Temp (!) 96.7 ??F (35.9 ??C)   Resp 16   Ht 6\' 4"  (1.93 m)   Wt 124 kg (273 lb 4.8 oz) Comment: pt states he weighed 260 lb 3 days ago   SpO2 97%   BMI 33.27 kg/m??     O2 Device: Room air     Temp (24hrs), Avg:97.2 ??F (36.2 ??C), Min:96.3 ??F (35.7 ??C), Max:98.6 ??F (37 ??C)      Last 24hr Input/Output:    Last 3 Recorded Weights in this Encounter    06/18/19 2255 06/19/19 0248   Weight: 122.5 kg (270 lb) 124 kg (273 lb 4.8 oz)     PHYSICAL EXAM:    Physical Exam  HENT:      Head: Normocephalic and atraumatic.   Cardiovascular:      Rate and Rhythm: Normal rate.      Heart sounds: No murmur.   Pulmonary:      Effort: No respiratory distress.      Breath sounds: No wheezing or rales.   Abdominal:      General: There is no distension.      Tenderness: There is no abdominal tenderness. There is no guarding.   Musculoskeletal:         General: No swelling.             Lab Data Reviewed:    No components found for: [facility]     Recent Labs     06/18/19  2334   WBC 5.0   HGB 13.8   HCT 42.1   PLT 214        Recent Labs     06/18/19  2334   NA 140   K 4.1   CL 108   CO2 26   BUN 8   CREA 0.89   GLU 93   CA 9.0   ALB 3.1*   AP 55   ALT 80*   TBILI 0.32        No results for input(s): INR, INREXT in the last 72 hours.      Lab Results   Component Value Date/Time    TROIQ <0.015 06/19/2019 11:53 AM    TROIQ <0.015 06/19/2019 05:16 AM    TROIQ <0.015 06/18/2019 11:34 PM        No results found for: CHOL, CHOLX, CHLST, CHOLV, HDL, HDLP, LDL, LDLC, DLDLP, TGLX, TRIGL, TRIGP, CHHD, [facility]    All Micro Results     Procedure Component Value Units Date/Time     COVID-19,INFLUENZA A/B,RSV PANEL [facility] Collected: 06/19/19 0128    Order Status: Completed Specimen: Nasopharyngeal from Nasopharynx Updated: 06/19/19 0222     SARS-CoV-2 Negative     Influenza A by PCR Negative     Influenza B by PCR Negative     RSV by PCR Negative          Xr Chest Pa Lat    Result Date: 06/19/2019  : NO EVIDENCE FOR ACUTE  CARDIOPULMONARY DISEASE. 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  He was to undergo inpatient cardiac catheterization to excess coronary arteries.  At the time he signed out AMA.  He re-presented to our facility with chest pain and discomfort.    1. Chest pain with backdrop of coronary artery disease status post CABG x2,  Serial troponins negative  EKG shows no evidence of ischemia  Echo today shows EF of 67% without any wall motion abnormalities  Cardiology consult sought to help determine if in-patient catheterization is warranted.  For evaluation of his chest pain  Patient refused stress test due to concerns for allergy    2.  DMII:  Hgb a1c of 8.3;   Monitor blood sugars; patient not on any oral hypoglycemic agents; consider metformin upon discharge.  Monitor BS qac and qhs    3.  HTN:  On metoprolol    DVT Prophylaxis:  [] Lovenox  [] Coumadin  [] Hep SQ  [] SCD???s   GI Prophylaxis:   [] H2B/PPI  Disposition:  [] Home w/ Family   [] HH PT,OT,RN   [] SNF/LTC   [] SAH/Rehab      *Medications reviewed:  Current Facility-Administered Medications   Medication Dose Route Frequency   ??? aspirin chewable tablet 324 mg  324 mg Oral DAILY   ??? atorvastatin (LIPITOR) tablet 40 mg  40 mg Oral QHS   ??? metoprolol succinate (TOPROL-XL) XL tablet 50 mg  50 mg Oral DAILY   ??? sodium chloride (NS) flush 3-10 mL  3-10 mL  IntraVENous Q12H   ??? sodium chloride (NS) flush 3-10 mL  3-10 mL IntraVENous PRN   ??? acetaminophen (TYLENOL) tablet 650 mg  650 mg Oral Q4H PRN    Or   ??? acetaminophen (TYLENOL) solution 650 mg  650 mg Oral Q4H PRN    Or   ??? acetaminophen (TYLENOL) suppository 650 mg  650 mg Rectal Q4H PRN   ??? naloxone (NARCAN) injection 0.4 mg  0.4 mg IntraVENous PRN   ??? ondansetron (ZOFRAN) injection 4 mg  4 mg IntraVENous Q6H PRN    Or   ??? ondansetron (ZOFRAN ODT) tablet 4 mg  4 mg Oral Q6H PRN   ??? heparin (porcine) injection 5,000 Units  5,000 Units SubCUTAneous Q12H   ??? meclizine (ANTIVERT) tablet 25 mg  25 mg Oral Q6H PRN              Total time spent with patient:     minutes    [] Critical Care time:    minutes    Care Plan discussed with:    [] Patient   [] Family    [] Care Manager    [] Nursing   [] Consultant/Specialist :      []   >50% of visit spent in counseling and coordination of care   (Discussed [] CODE status,  [] Care Plan, [] D/C Planning)        ___________________________________________________    Hospitalist: Dominga Ferry, MD     ___________________________________________________

## 2019-06-19 NOTE — Progress Notes (Signed)
Problem: Falls - Risk of  Goal: *Absence of Falls  Description: Document Dustin Carney Fall Risk and appropriate interventions in the flowsheet.  Outcome: Progressing Towards Goal  Note: Fall Risk Interventions:            Medication Interventions: Bed/chair exit alarm, Evaluate medications/consider consulting pharmacy, Patient to call before getting OOB, Teach patient to arise slowly         History of Falls Interventions: Bed/chair exit alarm         Problem: Patient Education: Go to Patient Education Activity  Goal: Patient/Family Education  Outcome: Progressing Towards Goal     Problem: Patient Education: Go to Patient Education Activity  Goal: Patient/Family Education  Outcome: Progressing Towards Goal     Problem: Unstable angina/NSTEMI: Day of Admission/Day 1  Goal: Off Pathway (Use only if patient is Off Pathway)  Outcome: Progressing Towards Goal

## 2019-06-19 NOTE — ED Notes (Signed)
Pt ate at this time. Per Dr. Moses Manners, ok to fed patient as he has not ate in 2 days and willing to stay. Pt provided with a [place] and chix sandwich, saltines, apple juice and water.

## 2019-06-19 NOTE — H&P (Signed)
Medicine Admission    NAME: Dustin Carney   DOB:  1976-02-11   MRN:  [id number]     Date/Time:  06/19/2019 1:18 AM    Patient PCP: None      CHIEF COMPLAINT:   Chest pain    HISTORY OF PRESENT ILLNESS:     Dustin Carney is a 44 y.o. male with past medical history of CAD status post CABG, mi x3, hypertension, congenital heart disease presents to the emergency department with above complaint.  The patient was admitted to [facility] 1-2 weeks ago for syncopal episode but he left against medical advice because the daughter passed away.  The plan at that time was to do a cardiac catheterization.  Today he was at work feeling some paperwork and he started with left-sided chest pain that he described as squeezing.  The pain radiated to the left arm was associated with nausea and swelling.  Denies shortness of breath, fever, headache, lightheadedness, palpitations.  In the emergency department he was managed with Dilaudid and Zofran with some improvement of the chest pain.  He refused nitroglycerin as he states that he is allergic to this medication causing skin rash and also syncopal episode.    We were asked to admit for work up and evaluation of the above problems.     REVIEW OF SYSTEMS:    Per HPI otherwise all other 12 review of system negative.    Past Medical History:   Diagnosis Date   ??? CAD (coronary artery disease)     MI x 3   ??? Cardiac revascularization with aortocoronary bypass anastomosis    ??? Chronic dental pain     - multiple teeth remove   ??? Chronic knee pain    ??? Hypertension    ??? Myocardial infarct [facility])    ??? S/P CABG x 1 2007    CABG x 1, 2007, due to congenital heart disease--patient's explanation is that right coronary artery was never in the proper location (perfused left side of heart only) and he had repeat cabage surgery in 2012 to correct this.   ??? Thromboembolus [facility])       Past Surgical History:   Procedure Laterality Date   ??? HX CHOLECYSTECTOMY  08/2010        ??? HX CORONARY ARTERY BYPASS GRAFT   2007, 2012    2007: due to congenital heart disease - patients explanation is that right coronary artery was never in the proper location (perfused left side of heart only) and had repeat CABG surgery in 2012 to correct this   ??? HX OTHER SURGICAL  11/30/2013    TOOTH EXTRACTION     Allergies   Allergen Reactions   ??? Coconut Anaphylaxis   ??? Codeine Hives   ??? Morphine Hives     At infusion site   ??? Motrin [Ibuprofen] Hives   ??? Toradol [Ketorolac] Hives   ??? Tramadol Hives   ??? Iodinated Contrast Media Swelling   ??? Nitroglycerin Hives      Prior to Admission medications    Medication Sig Start Date End Date Taking? Authorizing Provider   aluminum-magnesium hydroxide 200-200 mg/5 mL susp 5 mL, diphenhydrAMINE 12.5 mg/5 mL liqd 12.5 mg, lidocaine 2 % soln 5 mL 5 mL by Swish and Spit route two (2) times a day. Magic mouth wash   Maalox  Lidocaine 2% viscous   Diphenhydramine oral solution     Pharmacy to mix equal portions of ingredients to a total volume  as indicated in the dispense amount. 03/03/19   Messier, Adam R, PA   atorvastatin (LIPITOR) 40 mg tablet Take 40 mg by mouth nightly. 10/22/18   Provider, Historical   aspirin 81 mg chewable tablet Take 324 mg by mouth daily. Usually only takes 134m    Provider, Historical   metoprolol succinate (TOPROL-XL) 50 mg XL tablet Take 50 mg by mouth daily.    Provider, Historical     Social History     Tobacco Use   ??? Smoking status: Current Some Day Smoker     Packs/day: 0.25     Years: 30.00     Pack years: 7.50     Types: Cigarettes   ??? 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 Tobacco comment: About 40-50 pack years, used to smoke 2 PPD but now down to 2 cigarettes a day   Substance Use Topics   ??? Alcohol use: No      Family History   Problem Relation Age of Onset   ??? Heart Disease Mother    ??? Cancer Mother         BRAIN CANCER   ??? Diabetes Father    ??? Diabetes Brother    ??? Heart Disease Maternal Grandmother    ??? Dementia Maternal Grandmother    ??? Heart Disease Maternal Grandfather     ??? Heart Disease Paternal Grandmother    ??? Heart Disease Paternal Grandfather    ??? No Known Problems Other    ??? Heart Disease Brother         Physical Exam:  Visit Vitals  BP 136/78   Pulse 66   Temp 98.6 ??F (37 ??C)   Resp 17   Ht 6' 4"  (1.93 m)   Wt 122.5 kg (270 lb)   SpO2 99%   BMI 32.87 kg/m??     No intake or output data in the 24 hours ending 06/19/19 0118     General Appearance: Alert and oriented x 3, no acute distress.  Overweight  Ears/Nose/Mouth/Throat: Pupils equal and round reactive to light, Hearing grossly normal.  No nasal congestion. Anicteric sclera.  Neck: Supple.  JVP within normal limits. Carotids good upstrokes, with no bruit.  Lungs: clear to auscultation bilaterally, good air movement.  Cardiovascular: Regular rate and rhythm, S1S2 normal, no murmur, rubs, gallops.  Abdomen: Soft, non-tender, bowel sounds are active. No organomegaly.  Extremities: No edema bilaterally. No cyanosis no clubbing.  Skin: Warm and dry.  Neuro: CN II-XII grossly intact, Strength and sensation grossly intact.      24 Hour Results:  Recent Results (from the past 24 hour(s))   EKG, 12 LEAD, INITIAL    Collection Time: 06/18/19 10:55 PM   Result Value Ref Range    Heart Rate 59 bpm    RR Interval 1,017 ms    Atrial Rate 57 ms    P-R Interval 174 ms    P Duration 128 ms    P Horizontal Axis -10 deg    P Front Axis 45 deg    Q Onset 499 ms    QRSD Interval 99 ms    QT Interval 394 ms    QTcB 391 ms    QTcF 392 ms    QRS Horizontal Axis -37 deg    QRS Axis 72 deg    I-40 Horizontal Axis 58 deg    I-40 Front Axis 11 deg    T-40 Horizontal Axis -60 deg    T-40 Front Axis  117 deg    T Horizontal Axis 47 deg    T Wave Axis 60 deg    S-T Horizontal Axis 70 deg    S-T Front Axis 60 deg   CBC WITH AUTOMATED DIFF    Collection Time: 06/18/19 11:34 PM   Result Value Ref Range    WBC 5.0 4.0 - 10.0 K/uL    RBC 4.50 (L) 4.51 - 5.93 M/uL    HGB 13.8 13.7 - 17.5 g/dL    HCT 42.1 40.1 - 51.0 %    MCV 93.6 80.0 - 95.0 FL    MCH 30.7  25.6 - 32.2 PG    MCHC 32.8 32.2 - 35.5 g/dL    RDW 13.5 11.6 - 14.4 %    PLATELET 214 150 - 400 K/uL    MPV 10.5 9.4 - 12.4 FL    NEUTROPHILS 50 %    LYMPHOCYTES 42 %    MONOCYTES 6 %    EOSINOPHILS 2 %    BASOPHILS 0 0 - 1.2 %    ABS. NEUTROPHILS 2.5 1.6 - 6.1 K/UL    ABS. LYMPHOCYTES 2.1 1.2 - 3.7 K/UL    ABS. MONOCYTES 0.3 0.2 - 0.8 K/UL    ABS. EOSINOPHILS 0.1 0.0 - 0.5 K/UL    ABS. BASOPHILS 0.0 0.0 - 0.1 K/UL    DF AUTOMATED     IMMATURE GRANULOCYTES 0 0 - 0.4 %    ABS. IMM. GRANS. 0.0 0 - 2.84 K/UL   METABOLIC PANEL, COMPREHENSIVE    Collection Time: 06/18/19 11:34 PM   Result Value Ref Range    Sodium 140 136 - 145 mmol/L    Potassium 4.1 3.5 - 5.1 mmol/L    Chloride 108 98 - 110 mmol/L    CO2 26 21 - 32 mmol/L    Anion gap 6 5 - 15 mmol/L    Glucose 93 70 - 100 mg/dL    BUN 8 7 - 18 MG/DL    Creatinine 0.89 0.70 - 1.30 MG/DL    BUN/Creatinine ratio 9 (L) 12 - 20    GFR est AA >60 >60 ml/min/1.64m    GFR est non-AA >60 >60 ml/min/1.735m   Calcium 9.0 8.5 - 10.1 MG/DL    Bilirubin, total 0.32 0.00 - 1.20 mg/dL    ALT (SGPT) 80 (H) 16 - 61 U/L    AST (SGOT) 45 (H) 15 - 37 U/L    Alk. phosphatase 55 45 - 117 U/L    Protein, total 6.8 6.4 - 8.2 g/dL    Albumin 3.1 (L) 3.4 - 5.0 g/dL    A-G Ratio 0.8 (L) 1.0 - 3.0     TROPONIN I    Collection Time: 06/18/19 11:34 PM   Result Value Ref Range    Troponin-I, Qt. <0.015 0.000 - 0.040 ng/mL       Imaging:   CXR Results  (Last 48 hours)    None      No acute disease    EKG:  Sinus rhythm.  Similar to prior EKG.    Results for orders placed or performed during the hospital encounter of 06/18/19   EKG, 12 LEAD, INITIAL   Result Value Ref Range    Heart Rate 59 bpm    RR Interval 1,017 ms    Atrial Rate 57 ms    P-R Interval 174 ms    P Duration 128 ms    P Horizontal Axis -10 deg    P  Front Axis 45 deg    Q Onset 499 ms    QRSD Interval 99 ms    QT Interval 394 ms    QTcB 391 ms    QTcF 392 ms    QRS Horizontal Axis -37 deg    QRS Axis 72 deg    I-40 Horizontal Axis  58 deg    I-40 Front Axis 11 deg    T-40 Horizontal Axis -60 deg    T-40 Front Axis 117 deg    T Horizontal Axis 47 deg    T Wave Axis 60 deg    S-T Horizontal Axis 70 deg    S-T Front Axis 60 deg       CT Results (most recent):  Results from Abstract encounter on 09/02/18   CTA CHEST W OR W WO CONT       Assessment:    Principal Problem:    Chest pain (01/05/2019)    Active Problems:    Hyperlipidemia (09/25/2013)      Status post coronary artery bypass graft (08/25/2013)      Essential hypertension (03/22/2017)      Tobacco abuse (06/19/2019)        Analysis and plan:     Chest pain- patient have history of congenital heart disease, mi x3, status post CABG.  Patient had a syncopal episode last week and today he present complaining of left-sided chest pain, squeezing type radiating to the left arm associated with nausea and diaphoresis.  Patient is allergic to nitroglycerin so refuse medication.  EKG no signs of ACS.  Troponin within normal limits.  Patient was admitted to [facility] a week ago after syncopal episode and the plan was to do a cardiac catheterization but he had to leave against medical advice because he states that the daughter passed away a week ago.  -trend troponin every 6 hr x3  -telemetry  -NPO  -stress test in the morning  -aspirin 81 mg p.o. daily  -cardiac diet after stress test  -lipid panel and TSH level  -cardiology consult in the morning.  -continue metoprolol, statin    DVT Prophylaxis:  Heparin  Discussed Code Status:  Full code             High complexity decision making was performed    Signed By: Alisia Ferrari, MD    June 19, 2019

## 2019-06-19 NOTE — ED Notes (Signed)
Pt educated regarding transdermal Nitroglycerin use, mechanism of action and addressed his fear of adverse reaction to nitro. Pt verbalized understanding but continues to be upset that he was prescribed nitroglycerin. Reassurance and comfort provided. Pt continues to argue and request pain medication. MD aware.  MD at bedside to

## 2019-06-19 NOTE — ED Notes (Signed)
This patient was seen by previous ED provider Lynnea Maizes, signed out to me at midnight.  A brief medical history; 44 year old male, coming in past medical history of CAD, cardiac bypass surgery, coming to ED with sudden onset of substernal chest tightness while sitting on the desk, initial routine workup was unremarkable including normal EKG, normal troponin.  However, patient was given morphine since patient cannot take a nitroglycerin???, documented chin from several different hospital in different state that patient's blood pressure drops and passed out after nitroglycerin but eventually, patient also states that ED left-side hives.  In spite of negative workup, patient requesting another dose of pain medicine.  I have to review past medical history including multiple ER visit and multiple hospitalization in different hospital, namely, patient was at [facility] in [place] 9 days ago with chest pain, overnight observation, supposed to have a stress test/cardiac catheterization but patient left AMA, patient states that his 33 years old daughter got killed by car accident on that day, but there is no documentation of it.  Apparently, patient was at [facility] in [facility] yesterday, also documented of multiple ER visit including [facility] and [facility] for chest pain, usually leave AMA or without complete service when he was declined for any opiate.  Also there is documentation that patient has cardiology appointment at [facility] in January 25th but patient adamantly denies, states that his cardiologist is in [facility], also he states that he does not have any PCP as well.  Regardless, patient remained stable, watching TV comfortably, constantly asking something to eat and pain medicine which was discouraged since there is no clear indication that he is in severe distress and try to discourage of using opiate  for chronic pain.  I also have reviewed medical records from [facility] yesterday, patient stayed overnight, negative workup, discharged with follow-up with primary cardiologist at [facility], mentioning of the patient requesting Dilaudid shot upon discharge home which was discouraged as well.  Multiple ER and hospital records of final diagnosis of ???drug-seeking behavior???.  Patient become agitated when he could not get any opiate, and initial plan to have 2nd set of troponin and discharged with follow-up with primary cardiologist but patient willing to stay in the hospital if he can eat.  I discussed this case with accepting hospitalist, Dr. Moses Manners, will admit, and probably stress test or further investigation of workup as inpatient if patient willing to stay and complete workup since patient has multiple episode of leaving AMA after overnight staying in different hospital.

## 2019-06-19 NOTE — Progress Notes (Signed)
 Pt rang to see this RN. Pt states he is leaving. Charge RN came to assist. Pt refused to hear any education on why he should stay. Pt increasing in agitation, refusing us  to remove his PIV. Security called. Pt states he ripped his IV out. MD brought AMA papers but pt states I'm not signing shit. Pt escorted out with security. IV confirmed to be removed and in pt room.

## 2019-06-19 NOTE — Discharge Summary (Signed)
Hospitalist Update:      Pt states he wants to leave AMA  Patient has awake alert and oriented x3. He has capacity to make decisions but did not want to sign ama paperwork also did not want to wait for me so I can go over ama paperwork. He left and security escorted him out as he was being verbally abusive.

## 2019-06-19 NOTE — Progress Notes (Signed)
Problem: Falls - Risk of  Goal: *Absence of Falls  Description: Document Dustin Carney Fall Risk and appropriate interventions in the flowsheet.  Outcome: Progressing Towards Goal  Note: Fall Risk Interventions:            Medication Interventions: Bed/chair exit alarm, Patient to call before getting OOB, Teach patient to arise slowly         History of Falls Interventions: Door open when patient unattended, Investigate reason for fall, Assess for delayed presentation/identification of injury for 48 hrs (comment for end date)         Problem: Patient Education: Go to Patient Education Activity  Goal: Patient/Family Education  Outcome: Progressing Towards Goal     Problem: Patient Education: Go to Patient Education Activity  Goal: Patient/Family Education  Outcome: Progressing Towards Goal     Problem: Unstable angina/NSTEMI: Day of Admission/Day 1  Goal: Off Pathway (Use only if patient is Off Pathway)  Outcome: Progressing Towards Goal  Goal: Activity/Safety  Outcome: Progressing Towards Goal  Goal: Consults, if ordered  Outcome: Progressing Towards Goal  Goal: Diagnostic Test/Procedures  Outcome: Progressing Towards Goal  Goal: Nutrition/Diet  Outcome: Progressing Towards Goal  Goal: Discharge Planning  Outcome: Progressing Towards Goal  Goal: Medications  Outcome: Progressing Towards Goal  Goal: Respiratory  Outcome: Progressing Towards Goal  Goal: Treatments/Interventions/Procedures  Outcome: Progressing Towards Goal  Goal: Psychosocial  Outcome: Progressing Towards Goal  Goal: *Hemodynamically stable  Outcome: Progressing Towards Goal  Goal: *Optimal pain control at patient's stated goal  Outcome: Progressing Towards Goal  Goal: *Lungs clear or at baseline  Outcome: Progressing Towards Goal     Problem: Unstable angina/NSTEMI: Day 2  Goal: Off Pathway (Use only if patient is Off Pathway)  Outcome: Progressing Towards Goal  Goal: Activity/Safety  Outcome: Progressing Towards Goal  Goal: Consults, if ordered  Outcome:  Progressing Towards Goal  Goal: Diagnostic Test/Procedures  Outcome: Progressing Towards Goal  Goal: Nutrition/Diet  Outcome: Progressing Towards Goal  Goal: Discharge Planning  Outcome: Progressing Towards Goal  Goal: Medications  Outcome: Progressing Towards Goal  Goal: Respiratory  Outcome: Progressing Towards Goal  Goal: Treatments/Interventions/Procedures  Outcome: Progressing Towards Goal  Goal: Psychosocial  Outcome: Progressing Towards Goal  Goal: *Hemodynamically stable  Outcome: Progressing Towards Goal  Goal: *Optimal pain control at patient's stated goal  Outcome: Progressing Towards Goal  Goal: *Lungs clear or at baseline  Outcome: Progressing Towards Goal     Problem: Unstable Angina/NSTEMI: Discharge Outcomes  Goal: *Hemodynamically stable  Outcome: Progressing Towards Goal  Goal: *Stable cardiac rhythm  Outcome: Progressing Towards Goal  Goal: *Lungs clear or at baseline  Outcome: Progressing Towards Goal  Goal: *Optimal pain control at patient's stated goal  Outcome: Progressing Towards Goal  Goal: *Identifies cardiac risk factors  Outcome: Progressing Towards Goal  Goal: *Verbalizes home exercise program, activity guidelines, cardiac precautions  Outcome: Progressing Towards Goal  Goal: *Verbalizes understanding and describes prescribed diet  Outcome: Progressing Towards Goal  Goal: *Verbalizes name, dosage, time, side effects, and number of days to continue medications  Outcome: Progressing Towards Goal  Goal: *Anxiety reduced or absent  Outcome: Progressing Towards Goal  Goal: *Understands and describes signs and symptoms to report to providers(Stroke Metric)  Outcome: Progressing Towards Goal  Goal: *Describes follow-up/return visits to physicians  Outcome: Progressing Towards Goal  Goal: *Describes available resources and support systems  Outcome: Progressing Towards Goal  Goal: *Influenza immunization  Outcome: Progressing Towards Goal  Goal: *Pneumococcal immunization  Outcome: Progressing  Towards Goal  Goal: *Describes smoking cessation resources  Outcome: Progressing Towards Goal     Problem: Diabetes Self-Management  Goal: *Disease process and treatment process  Description: Define diabetes and identify own type of diabetes; list 3 options for treating diabetes.  Outcome: Progressing Towards Goal  Goal: *Incorporating nutritional management into lifestyle  Description: Describe effect of type, amount and timing of food on blood glucose; list 3 methods for planning meals.  Outcome: Progressing Towards Goal  Goal: *Incorporating physical activity into lifestyle  Description: State effect of exercise on blood glucose levels.  Outcome: Progressing Towards Goal  Goal: *Developing strategies to promote health/change behavior  Description: Define the ABC's of diabetes; identify appropriate screenings, schedule and personal plan for screenings.  Outcome: Progressing Towards Goal  Goal: *Using medications safely  Description: State effect of diabetes medications on diabetes; name diabetes medication taking, action and side effects.  Outcome: Progressing Towards Goal  Goal: *Monitoring blood glucose, interpreting and using results  Description: Identify recommended blood glucose targets  and personal targets.  Outcome: Progressing Towards Goal  Goal: *Prevention, detection, treatment of acute complications  Description: List symptoms of hyper- and hypoglycemia; describe how to treat low blood sugar and actions for lowering  high blood glucose level.  Outcome: Progressing Towards Goal  Goal: *Prevention, detection and treatment of chronic complications  Description: Define the natural course of diabetes and describe the relationship of blood glucose levels to long term complications of diabetes.  Outcome: Progressing Towards Goal  Goal: *Developing strategies to address psychosocial issues  Description: Describe feelings about living with diabetes; identify support needed and support network  Outcome:  Progressing Towards Goal  Goal: *Insulin pump training  Outcome: Progressing Towards Goal  Goal: *Sick day guidelines  Outcome: Progressing Towards Goal  Goal: *Patient Specific Goal (EDIT GOAL, INSERT TEXT)  Outcome: Progressing Towards Goal     Problem: Patient Education: Go to Patient Education Activity  Goal: Patient/Family Education  Outcome: Progressing Towards Goal
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
## 2019-06-19 NOTE — ED Notes (Signed)
 TRANSFER - OUT REPORT:    Verbal report given to Darice, RN(name) on Dustin Carney  being transferred to 3S(unit) for routine progression of care       Report consisted of patient's Situation, Background, Assessment and   Recommendations(SBAR).     Information from the following report(s) SBAR, ED Summary, [facility] and Recent Results was reviewed with the receiving nurse.    Lines:   Peripheral IV 06/18/19 Right Foot (Active)   Site Assessment Clean, dry, & intact 06/18/19 2258   Phlebitis Assessment 0 06/18/19 2258   Infiltration Assessment 0 06/18/19 2258   Dressing Status Clean, dry, & intact 06/18/19 2258       Peripheral IV 06/18/19 Right Antecubital (Active)   Site Assessment Clean, dry, & intact 06/18/19 2305   Phlebitis Assessment 0 06/18/19 2305   Infiltration Assessment 0 06/18/19 2305   Dressing Status Clean, dry, & intact 06/18/19 2305        Opportunity for questions and clarification was provided.      Patient transported with:   Monitor

## 2019-06-19 NOTE — Progress Notes (Signed)
Phone call - patient has no spiritual care needs at this time - assured him of our continued thoughts

## 2019-06-19 NOTE — ED Notes (Signed)
Pt continues to request for pain medication and something to eat. Primary RN, Dr. Nedra Hai and Irving Burton, RN, have all re-iterated the plan, that the patient cannot eat at this time and the hospitalist has to evaluate the patient prior to any more pain medication and food per MD. Pt angry and upset about answer.

## 2019-06-19 NOTE — Progress Notes (Signed)
 Pt stating chest pain is still 8/10 and radiating. MD and cardiology have reviewed EKG and labs. Continued pain made aware to MD. Pt refuses heparin. Pt now POC BG check due to a1c. Pt states his diabetes doctor informed him his personal BG range for his height/weight is 220-240 and he will not start insulin  if we later need to. MD aware.     1800 Pt refusing to allow allow phlebotomy to draw labs. Pt states he only allows two sticks per person. Pt refusing to allow use on his RUE per phlebotomy. Last troponin not drawn.    1845 Pt states he is going to leave if we won't treat his pain. MD aware and ordered motrin.

## 2019-06-19 NOTE — Progress Notes (Signed)
Problem: Falls - Risk of  Goal: *Absence of Falls  Description: Document Schmid Fall Risk and appropriate interventions in the flowsheet.  Outcome: Progressing Towards Goal  Note: Fall Risk Interventions:            Medication Interventions: Bed/chair exit alarm, Evaluate medications/consider consulting pharmacy, Patient to call before getting OOB, Teach patient to arise slowly         History of Falls Interventions: Bed/chair exit alarm         Problem: Patient Education: Go to Patient Education Activity  Goal: Patient/Family Education  Outcome: Progressing Towards Goal     Problem: Patient Education: Go to Patient Education Activity  Goal: Patient/Family Education  Outcome: Progressing Towards Goal     Problem: Unstable angina/NSTEMI: Day of Admission/Day 1  Goal: Off Pathway (Use only if patient is Off Pathway)  Outcome: Progressing Towards Goal

## 2019-06-19 NOTE — Progress Notes (Signed)
Hospitalist Progress Note    NAME:  Dustin Carney   DOB:   12-20-1975   MRN:   [id number]     Date of Service: 06/19/2019      Subjective:   Complaining of chest pain which was radiating to neck and arm.  Patient appears comfortable in bed.  Refused stress test this a.m.    Objective:   Vitals:  Visit Vitals  BP 132/82 (BP 1 Location: Left upper arm, BP Patient Position: Supine)   Pulse 62   Temp (!) 96.7 ??F (35.9 ??C)   Resp 16   Ht 6\' 4"  (1.93 m)   Wt 124 kg (273 lb 4.8 oz) Comment: pt states he weighed 260 lb 3 days ago   SpO2 97%   BMI 33.27 kg/m??     O2 Device: Room air     Temp (24hrs), Avg:97.2 ??F (36.2 ??C), Min:96.3 ??F (35.7 ??C), Max:98.6 ??F (37 ??C)      Last 24hr Input/Output:    Last 3 Recorded Weights in this Encounter    06/18/19 2255 06/19/19 0248   Weight: 122.5 kg (270 lb) 124 kg (273 lb 4.8 oz)     PHYSICAL EXAM:    Physical Exam  HENT:      Head: Normocephalic and atraumatic.   Cardiovascular:      Rate and Rhythm: Normal rate.      Heart sounds: No murmur.   Pulmonary:      Effort: No respiratory distress.      Breath sounds: No wheezing or rales.   Abdominal:      General: There is no distension.      Tenderness: There is no abdominal tenderness. There is no guarding.   Musculoskeletal:         General: No swelling.             Lab Data Reviewed:    No components found for: [place]     06/18/19  2334   WBC 5.0   HGB 13.8   HCT 42.1   PLT 214        Recent Labs     06/18/19  2334   NA 140   K 4.1   CL 108   CO2 26   BUN 8   CREA 0.89   GLU 93   CA 9.0   ALB 3.1*   AP 55   ALT 80*   TBILI 0.32        No results for input(s): INR, INREXT in the last 72 hours.      Lab Results   Component Value Date/Time    TROIQ <0.015 06/19/2019 11:53 AM    TROIQ <0.015 06/19/2019 05:16 AM    TROIQ <0.015 06/18/2019 11:34 PM        No results found for: CHOL, CHOLX, CHLST, CHOLV, HDL, HDLP, LDL, LDLC, DLDLP, TGLX, TRIGL, TRIGP, CHHD, [facility]    All Micro Results      Procedure Component Value Units Date/Time    COVID-19,INFLUENZA A/B,RSV PANEL [[id number]] Collected: 06/19/19 0128    Order Status: Completed Specimen: Nasopharyngeal from Nasopharynx Updated: 06/19/19 0222     SARS-CoV-2 Negative     Influenza A by PCR Negative     Influenza B by PCR Negative     RSV by PCR Negative          Xr Chest Pa Lat    Result Date: 06/19/2019  : NO EVIDENCE FOR ACUTE  CARDIOPULMONARY DISEASE. J:  [id number] D:  06/19/2019 07:55:33 T:  06/19/2019 08:01:21            Assessment/Plan:   44 year old male with history of anomalous coronary artery status post open heart surgery in 2017 with redo in 2012 who has had his care taken throughout [place] who was recently admitted at [facility] for chest pain.  He was to undergo inpatient cardiac catheterization to excess coronary arteries.  At the time he signed out AMA.  He re-presented to our facility with chest pain and discomfort.    1. Chest pain with backdrop of coronary artery disease status post CABG x2,  Serial troponins negative  EKG shows no evidence of ischemia  Echo today shows EF of 67% without any wall motion abnormalities  Cardiology consult sought to help determine if in-patient catheterization is warranted.  For evaluation of his chest pain  Patient refused stress test due to concerns for allergy    2.  DMII:  Hgb a1c of 8.3;   Monitor blood sugars; patient not on any oral hypoglycemic agents; consider metformin upon discharge.  Monitor BS qac and qhs    3.  HTN:  On metoprolol    DVT Prophylaxis:  [] Lovenox  [] Coumadin  [] Hep SQ  [] SCD???s   GI Prophylaxis:   [] H2B/PPI  Disposition:  [] Home w/ Family   [] HH PT,OT,RN   [] SNF/LTC   [] SAH/Rehab      *Medications reviewed:  Current Facility-Administered Medications   Medication Dose Route Frequency   ??? aspirin chewable tablet 324 mg  324 mg Oral DAILY   ??? atorvastatin (LIPITOR) tablet 40 mg  40 mg Oral QHS   ??? metoprolol succinate (TOPROL-XL) XL tablet 50 mg  50 mg Oral DAILY    ??? sodium chloride (NS) flush 3-10 mL  3-10 mL IntraVENous Q12H   ??? sodium chloride (NS) flush 3-10 mL  3-10 mL IntraVENous PRN   ??? acetaminophen (TYLENOL) tablet 650 mg  650 mg Oral Q4H PRN    Or   ??? acetaminophen (TYLENOL) solution 650 mg  650 mg Oral Q4H PRN    Or   ??? acetaminophen (TYLENOL) suppository 650 mg  650 mg Rectal Q4H PRN   ??? naloxone (NARCAN) injection 0.4 mg  0.4 mg IntraVENous PRN   ??? ondansetron (ZOFRAN) injection 4 mg  4 mg IntraVENous Q6H PRN    Or   ??? ondansetron (ZOFRAN ODT) tablet 4 mg  4 mg Oral Q6H PRN   ??? heparin (porcine) injection 5,000 Units  5,000 Units SubCUTAneous Q12H   ??? meclizine (ANTIVERT) tablet 25 mg  25 mg Oral Q6H PRN              Total time spent with patient:     minutes    [] Critical Care time:    minutes    Care Plan discussed with:    [] Patient   [] Family    [] Care Manager    [] Nursing   [] Consultant/Specialist :      []   >50% of visit spent in counseling and coordination of care   (Discussed [] CODE status,  [] Care Plan, [] D/C Planning)        ___________________________________________________    Hospitalist: , MD     ___________________________________________________

## 2019-06-19 NOTE — Progress Notes (Addendum)
Pt stating chest pain is still 8/10 and radiating. MD and cardiology have reviewed EKG and labs. Continued pain made aware to MD. Pt refuses heparin. Pt now POC BG check due to a1c. Pt states his "diabetes doctor" informed him his personal BG range for his height/weight is 220-240 and he will not start insulin if we later need to. MD aware.     1800 Pt refusing to allow allow phlebotomy to draw labs. Pt states "he only allows two sticks per person." Pt refusing to allow use on his RUE per phlebotomy. Last troponin not drawn.    1845 Pt states he is going to leave if we won't treat his pain. MD aware and ordered motrin.

## 2019-06-19 NOTE — Progress Notes (Signed)
Phone call - patient has no spiritual care needs at this time - assured him of our continued thoughts

## 2019-06-19 NOTE — Progress Notes (Signed)
Problem: Falls - Risk of  Goal: *Absence of Falls  Description: Document Dustin Carney Fall Risk and appropriate interventions in the flowsheet.  Outcome: Progressing Towards Goal  Note: Fall Risk Interventions:            Medication Interventions: Bed/chair exit alarm, Patient to call before getting OOB, Teach patient to arise slowly         History of Falls Interventions: Door open when patient unattended, Investigate reason for fall, Assess for delayed presentation/identification of injury for 48 hrs (comment for end date)         Problem: Patient Education: Go to Patient Education Activity  Goal: Patient/Family Education  Outcome: Progressing Towards Goal     Problem: Patient Education: Go to Patient Education Activity  Goal: Patient/Family Education  Outcome: Progressing Towards Goal     Problem: Unstable angina/NSTEMI: Day of Admission/Day 1  Goal: Off Pathway (Use only if patient is Off Pathway)  Outcome: Progressing Towards Goal  Goal: Activity/Safety  Outcome: Progressing Towards Goal  Goal: Consults, if ordered  Outcome: Progressing Towards Goal  Goal: Diagnostic Test/Procedures  Outcome: Progressing Towards Goal  Goal: Nutrition/Diet  Outcome: Progressing Towards Goal  Goal: Discharge Planning  Outcome: Progressing Towards Goal  Goal: Medications  Outcome: Progressing Towards Goal  Goal: Respiratory  Outcome: Progressing Towards Goal  Goal: Treatments/Interventions/Procedures  Outcome: Progressing Towards Goal  Goal: Psychosocial  Outcome: Progressing Towards Goal  Goal: *Hemodynamically stable  Outcome: Progressing Towards Goal  Goal: *Optimal pain control at patient's stated goal  Outcome: Progressing Towards Goal  Goal: *Lungs clear or at baseline  Outcome: Progressing Towards Goal     Problem: Unstable angina/NSTEMI: Day 2  Goal: Off Pathway (Use only if patient is Off Pathway)  Outcome: Progressing Towards Goal  Goal: Activity/Safety  Outcome: Progressing Towards Goal  Goal: Consults, if ordered   Outcome: Progressing Towards Goal  Goal: Diagnostic Test/Procedures  Outcome: Progressing Towards Goal  Goal: Nutrition/Diet  Outcome: Progressing Towards Goal  Goal: Discharge Planning  Outcome: Progressing Towards Goal  Goal: Medications  Outcome: Progressing Towards Goal  Goal: Respiratory  Outcome: Progressing Towards Goal  Goal: Treatments/Interventions/Procedures  Outcome: Progressing Towards Goal  Goal: Psychosocial  Outcome: Progressing Towards Goal  Goal: *Hemodynamically stable  Outcome: Progressing Towards Goal  Goal: *Optimal pain control at patient's stated goal  Outcome: Progressing Towards Goal  Goal: *Lungs clear or at baseline  Outcome: Progressing Towards Goal     Problem: Unstable Angina/NSTEMI: Discharge Outcomes  Goal: *Hemodynamically stable  Outcome: Progressing Towards Goal  Goal: *Stable cardiac rhythm  Outcome: Progressing Towards Goal  Goal: *Lungs clear or at baseline  Outcome: Progressing Towards Goal  Goal: *Optimal pain control at patient's stated goal  Outcome: Progressing Towards Goal  Goal: *Identifies cardiac risk factors  Outcome: Progressing Towards Goal  Goal: *Verbalizes home exercise program, activity guidelines, cardiac precautions  Outcome: Progressing Towards Goal  Goal: *Verbalizes understanding and describes prescribed diet  Outcome: Progressing Towards Goal  Goal: *Verbalizes name, dosage, time, side effects, and number of days to continue medications  Outcome: Progressing Towards Goal  Goal: *Anxiety reduced or absent  Outcome: Progressing Towards Goal  Goal: *Understands and describes signs and symptoms to report to providers(Stroke Metric)  Outcome: Progressing Towards Goal  Goal: *Describes follow-up/return visits to physicians  Outcome: Progressing Towards Goal  Goal: *Describes available resources and support systems  Outcome: Progressing Towards Goal  Goal: *Influenza immunization  Outcome: Progressing Towards Goal  Goal: *Pneumococcal immunization   Outcome: Progressing Towards Goal  Goal: *Describes smoking cessation resources  Outcome: Progressing Towards Goal     Problem: Diabetes Self-Management  Goal: *Disease process and treatment process  Description: Define diabetes and identify own type of diabetes; list 3 options for treating diabetes.  Outcome: Progressing Towards Goal  Goal: *Incorporating nutritional management into lifestyle  Description: Describe effect of type, amount and timing of food on blood glucose; list 3 methods for planning meals.  Outcome: Progressing Towards Goal  Goal: *Incorporating physical activity into lifestyle  Description: State effect of exercise on blood glucose levels.  Outcome: Progressing Towards Goal  Goal: *Developing strategies to promote health/change behavior  Description: Define the ABC's of diabetes; identify appropriate screenings, schedule and personal plan for screenings.  Outcome: Progressing Towards Goal  Goal: *Using medications safely  Description: State effect of diabetes medications on diabetes; name diabetes medication taking, action and side effects.  Outcome: Progressing Towards Goal  Goal: *Monitoring blood glucose, interpreting and using results  Description: Identify recommended blood glucose targets  and personal targets.  Outcome: Progressing Towards Goal  Goal: *Prevention, detection, treatment of acute complications  Description: List symptoms of hyper- and hypoglycemia; describe how to treat low blood sugar and actions for lowering  high blood glucose level.  Outcome: Progressing Towards Goal  Goal: *Prevention, detection and treatment of chronic complications  Description: Define the natural course of diabetes and describe the relationship of blood glucose levels to long term complications of diabetes.  Outcome: Progressing Towards Goal  Goal: *Developing strategies to address psychosocial issues   Description: Describe feelings about living with diabetes; identify support needed and support network  Outcome: Progressing Towards Goal  Goal: *Insulin pump training  Outcome: Progressing Towards Goal  Goal: *Sick day guidelines  Outcome: Progressing Towards Goal  Goal: *Patient Specific Goal (EDIT GOAL, INSERT TEXT)  Outcome: Progressing Towards Goal     Problem: Patient Education: Go to Patient Education Activity  Goal: Patient/Family Education  Outcome: Progressing Towards Goal

## 2019-06-19 NOTE — Progress Notes (Signed)
Pt rang to see this RN. Pt states he is leaving. Charge RN came to assist. Pt refused to hear any education on why he should stay. Pt increasing in agitation, refusing us to remove his PIV. Security called. Pt states he ripped his IV out. MD brought AMA papers but pt states "I'm not signing shit." Pt escorted out with security. IV confirmed to be removed and in pt room.

## 2019-06-19 NOTE — Discharge Summary (Signed)
Hospitalist Update:      Pt states he wants to leave AMA  Patient has awake alert and oriented x3. He has capacity to make decisions but did not want to sign ama paperwork also did not want to wait for me so I can go over ama paperwork. He left and security escorted him out as he was being verbally abusive.

## 2019-06-19 NOTE — H&P (Signed)
Medicine Admission    NAME: Dustin Carney   DOB:  1976/04/25   MRN:  [id number]     Date/Time:  06/19/2019 1:18 AM    Patient PCP: None      CHIEF COMPLAINT:   Chest pain    HISTORY OF PRESENT ILLNESS:     Dustin Carney is a 44 y.o. male with past medical history of CAD status post CABG, mi x3, hypertension, congenital heart disease presents to the emergency department with above complaint.  The patient was admitted to [facility] 1-2 weeks ago for syncopal episode but he left against medical advice because the daughter passed away.  The plan at that time was to do a cardiac catheterization.  Today he was at work feeling some paperwork and he started with left-sided chest pain that he described as squeezing.  The pain radiated to the left arm was associated with nausea and swelling.  Denies shortness of breath, fever, headache, lightheadedness, palpitations.  In the emergency department he was managed with Dilaudid and Zofran with some improvement of the chest pain.  He refused nitroglycerin as he states that he is allergic to this medication causing skin rash and also syncopal episode.    We were asked to admit for work up and evaluation of the above problems.     REVIEW OF SYSTEMS:    Per HPI otherwise all other 12 review of system negative.    Past Medical History:   Diagnosis Date   ??? CAD (coronary artery disease)     MI x 3   ??? Cardiac revascularization with aortocoronary bypass anastomosis    ??? Chronic dental pain     - multiple teeth remove   ??? Chronic knee pain    ??? Hypertension    ??? Myocardial infarct [facility])    ??? S/P CABG x 1 2007    CABG x 1, 2007, due to congenital heart disease--patient's explanation is that right coronary artery was never in the proper location (perfused left side of heart only) and he had repeat cabage surgery in 2012 to correct this.   ??? Thromboembolus [facility])       Past Surgical History:   Procedure Laterality Date   ??? HX CHOLECYSTECTOMY  08/2010         ??? HX CORONARY ARTERY BYPASS GRAFT  2007, 2012    2007: due to congenital heart disease - patients explanation is that right coronary artery was never in the proper location (perfused left side of heart only) and had repeat CABG surgery in 2012 to correct this   ??? HX OTHER SURGICAL  11/30/2013    TOOTH EXTRACTION     Allergies   Allergen Reactions   ??? Coconut Anaphylaxis   ??? Codeine Hives   ??? Morphine Hives     At infusion site   ??? Motrin [Ibuprofen] Hives   ??? Toradol [Ketorolac] Hives   ??? Tramadol Hives   ??? Iodinated Contrast Media Swelling   ??? Nitroglycerin Hives      Prior to Admission medications    Medication Sig Start Date End Date Taking? Authorizing Provider   aluminum-magnesium hydroxide 200-200 mg/5 mL susp 5 mL, diphenhydrAMINE 12.5 mg/5 mL liqd 12.5 mg, lidocaine 2 % soln 5 mL 5 mL by Swish and Spit route two (2) times a day. Magic mouth wash   Maalox  Lidocaine 2% viscous   Diphenhydramine oral solution     Pharmacy to mix equal portions of ingredients to a total volume  as indicated in the dispense amount. 03/03/19   Messier, Adam R, PA   atorvastatin (LIPITOR) 40 mg tablet Take 40 mg by mouth nightly. 10/22/18   Provider, Historical   aspirin 81 mg chewable tablet Take 324 mg by mouth daily. Usually only takes 170m    Provider, Historical   metoprolol succinate (TOPROL-XL) 50 mg XL tablet Take 50 mg by mouth daily.    Provider, Historical     Social History     Tobacco Use   ??? Smoking status: Current Some Day Smoker     Packs/day: 0.25     Years: 30.00     Pack years: 7.50     Types: Cigarettes   ??? 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 Tobacco comment: About 40-50 pack years, used to smoke 2 PPD but now down to 2 cigarettes a day   Substance Use Topics   ??? Alcohol use: No      Family History   Problem Relation Age of Onset   ??? Heart Disease Mother    ??? Cancer Mother         BRAIN CANCER   ??? Diabetes Father    ??? Diabetes Brother    ??? Heart Disease Maternal Grandmother    ??? Dementia Maternal Grandmother     ??? Heart Disease Maternal Grandfather    ??? Heart Disease Paternal Grandmother    ??? Heart Disease Paternal Grandfather    ??? No Known Problems Other    ??? Heart Disease Brother         Physical Exam:  Visit Vitals  BP 136/78   Pulse 66   Temp 98.6 ??F (37 ??C)   Resp 17   Ht 6' 4"  (1.93 m)   Wt 122.5 kg (270 lb)   SpO2 99%   BMI 32.87 kg/m??     No intake or output data in the 24 hours ending 06/19/19 0118     General Appearance: Alert and oriented x 3, no acute distress.  Overweight  Ears/Nose/Mouth/Throat: Pupils equal and round reactive to light, Hearing grossly normal.  No nasal congestion. Anicteric sclera.  Neck: Supple.  JVP within normal limits. Carotids good upstrokes, with no bruit.  Lungs: clear to auscultation bilaterally, good air movement.  Cardiovascular: Regular rate and rhythm, S1S2 normal, no murmur, rubs, gallops.  Abdomen: Soft, non-tender, bowel sounds are active. No organomegaly.  Extremities: No edema bilaterally. No cyanosis no clubbing.  Skin: Warm and dry.  Neuro: CN II-XII grossly intact, Strength and sensation grossly intact.      24 Hour Results:  Recent Results (from the past 24 hour(s))   EKG, 12 LEAD, INITIAL    Collection Time: 06/18/19 10:55 PM   Result Value Ref Range    Heart Rate 59 bpm    RR Interval 1,017 ms    Atrial Rate 57 ms    P-R Interval 174 ms    P Duration 128 ms    P Horizontal Axis -10 deg    P Front Axis 45 deg    Q Onset 499 ms    QRSD Interval 99 ms    QT Interval 394 ms    QTcB 391 ms    QTcF 392 ms    QRS Horizontal Axis -37 deg    QRS Axis 72 deg    I-40 Horizontal Axis 58 deg    I-40 Front Axis 11 deg    T-40 Horizontal Axis -60 deg    T-40 Front Axis  117 deg    T Horizontal Axis 47 deg    T Wave Axis 60 deg    S-T Horizontal Axis 70 deg    S-T Front Axis 60 deg   CBC WITH AUTOMATED DIFF    Collection Time: 06/18/19 11:34 PM   Result Value Ref Range    WBC 5.0 4.0 - 10.0 K/uL    RBC 4.50 (L) 4.51 - 5.93 M/uL    HGB 13.8 13.7 - 17.5 g/dL    HCT 42.1 40.1 - 51.0 %     MCV 93.6 80.0 - 95.0 FL    MCH 30.7 25.6 - 32.2 PG    MCHC 32.8 32.2 - 35.5 g/dL    RDW 13.5 11.6 - 14.4 %    PLATELET 214 150 - 400 K/uL    MPV 10.5 9.4 - 12.4 FL    NEUTROPHILS 50 %    LYMPHOCYTES 42 %    MONOCYTES 6 %    EOSINOPHILS 2 %    BASOPHILS 0 0 - 1.2 %    ABS. NEUTROPHILS 2.5 1.6 - 6.1 K/UL    ABS. LYMPHOCYTES 2.1 1.2 - 3.7 K/UL    ABS. MONOCYTES 0.3 0.2 - 0.8 K/UL    ABS. EOSINOPHILS 0.1 0.0 - 0.5 K/UL    ABS. BASOPHILS 0.0 0.0 - 0.1 K/UL    DF AUTOMATED     IMMATURE GRANULOCYTES 0 0 - 0.4 %    ABS. IMM. GRANS. 0.0 0 - 1.61 K/UL   METABOLIC PANEL, COMPREHENSIVE    Collection Time: 06/18/19 11:34 PM   Result Value Ref Range    Sodium 140 136 - 145 mmol/L    Potassium 4.1 3.5 - 5.1 mmol/L    Chloride 108 98 - 110 mmol/L    CO2 26 21 - 32 mmol/L    Anion gap 6 5 - 15 mmol/L    Glucose 93 70 - 100 mg/dL    BUN 8 7 - 18 MG/DL    Creatinine 0.89 0.70 - 1.30 MG/DL    BUN/Creatinine ratio 9 (L) 12 - 20    GFR est AA >60 >60 ml/min/1.8m    GFR est non-AA >60 >60 ml/min/1.740m   Calcium 9.0 8.5 - 10.1 MG/DL    Bilirubin, total 0.32 0.00 - 1.20 mg/dL    ALT (SGPT) 80 (H) 16 - 61 U/L    AST (SGOT) 45 (H) 15 - 37 U/L    Alk. phosphatase 55 45 - 117 U/L    Protein, total 6.8 6.4 - 8.2 g/dL    Albumin 3.1 (L) 3.4 - 5.0 g/dL    A-G Ratio 0.8 (L) 1.0 - 3.0     TROPONIN I    Collection Time: 06/18/19 11:34 PM   Result Value Ref Range    Troponin-I, Qt. <0.015 0.000 - 0.040 ng/mL       Imaging:   CXR Results  (Last 48 hours)    None      No acute disease    EKG:  Sinus rhythm.  Similar to prior EKG.    Results for orders placed or performed during the hospital encounter of 06/18/19   EKG, 12 LEAD, INITIAL   Result Value Ref Range    Heart Rate 59 bpm    RR Interval 1,017 ms    Atrial Rate 57 ms    P-R Interval 174 ms    P Duration 128 ms    P Horizontal Axis -10 deg    P  Front Axis 45 deg    Q Onset 499 ms    QRSD Interval 99 ms    QT Interval 394 ms    QTcB 391 ms    QTcF 392 ms    QRS Horizontal Axis -37 deg     QRS Axis 72 deg    I-40 Horizontal Axis 58 deg    I-40 Front Axis 11 deg    T-40 Horizontal Axis -60 deg    T-40 Front Axis 117 deg    T Horizontal Axis 47 deg    T Wave Axis 60 deg    S-T Horizontal Axis 70 deg    S-T Front Axis 60 deg       CT Results (most recent):  Results from Abstract encounter on 09/02/18   CTA CHEST W OR W WO CONT       Assessment:    Principal Problem:    Chest pain (01/05/2019)    Active Problems:    Hyperlipidemia (09/25/2013)      Status post coronary artery bypass graft (08/25/2013)      Essential hypertension (03/22/2017)      Tobacco abuse (06/19/2019)        Analysis and plan:     Chest pain- patient have history of congenital heart disease, mi x3, status post CABG.  Patient had a syncopal episode last week and today he present complaining of left-sided chest pain, squeezing type radiating to the left arm associated with nausea and diaphoresis.  Patient is allergic to nitroglycerin so refuse medication.  EKG no signs of ACS.  Troponin within normal limits.  Patient was admitted to [facility] a week ago after syncopal episode and the plan was to do a cardiac catheterization but he had to leave against medical advice because he states that the daughter passed away a week ago.  -trend troponin every 6 hr x3  -telemetry  -NPO  -stress test in the morning  -aspirin 81 mg p.o. daily  -cardiac diet after stress test  -lipid panel and TSH level  -cardiology consult in the morning.  -continue metoprolol, statin    DVT Prophylaxis:  Heparin  Discussed Code Status:  Full code             High complexity decision making was performed    Signed By: Alisia Ferrari, MD    June 19, 2019
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
## 2019-06-19 NOTE — ED Notes (Addendum)
Pt continues to request for pain medication and something to eat. Primary RN, Dr. Lee and Emily, RN, have all re-iterated the plan, that the patient cannot eat at this time and the hospitalist has to evaluate the patient prior to any more pain medication and food per MD. Pt angry and upset about answer.

## 2019-06-19 NOTE — ED Notes (Addendum)
Pt educated regarding transdermal Nitroglycerin use, mechanism of action and addressed his fear of adverse reaction to nitro. Pt verbalized understanding but continues to be upset that he was prescribed nitroglycerin. Reassurance and comfort provided. Pt continues to argue and request pain medication. MD aware.  MD at bedside to

## 2019-06-19 NOTE — ED Notes (Signed)
This patient was seen by previous ED provider Peter Dias,PA, signed out to me at midnight.  A brief medical history; 43-year-old male, coming in past medical history of CAD, cardiac bypass surgery, coming to ED with sudden onset of substernal chest tightness while sitting on the desk, initial routine workup was unremarkable including normal EKG, normal troponin.  However, patient was given morphine since patient cannot take a nitroglycerin???, documented chin from several different hospital in different state that patient's blood pressure drops and passed out after nitroglycerin but eventually, patient also states that ED left-side hives.  In spite of negative workup, patient requesting another dose of pain medicine.  I have to review past medical history including multiple ER visit and multiple hospitalization in different hospital, namely, patient was at [facility] in [place] 9 days ago with chest pain, overnight observation, supposed to have a stress test/cardiac catheterization but patient left AMA, patient states that his 15 years old daughter got killed by car accident on that day, but there is no documentation of it.  Apparently, patient was at [facility] in [place] yesterday, also documented of multiple ER visit including [facility] and [facility] for chest pain, usually leave AMA or without complete service when he was declined for any opiate.  Also there is documentation that patient has cardiology appointment at [facility] in January 25th but patient adamantly denies, states that his cardiologist is in [facility], also he states that he does not have any PCP as well.  Regardless, patient remained stable, watching TV comfortably, constantly asking something to eat and pain medicine which was discouraged since there is no clear indication that he is in severe distress and try to discourage of using opiate  for chronic pain.  I also have reviewed medical records from [facility] yesterday, patient stayed overnight, negative workup, discharged with follow-up with primary cardiologist at [facility], mentioning of the patient requesting Dilaudid shot upon discharge home which was discouraged as well.  Multiple ER and hospital records of final diagnosis of ???drug-seeking behavior???.  Patient become agitated when he could not get any opiate, and initial plan to have 2nd set of troponin and discharged with follow-up with primary cardiologist but patient willing to stay in the hospital if he can eat.  I discussed this case with accepting hospitalist, Dr. Agosto, will admit, and probably stress test or further investigation of workup as inpatient if patient willing to stay and complete workup since patient has multiple episode of leaving AMA after overnight staying in different hospital.

## 2019-06-19 NOTE — ED Notes (Signed)
Pt ate at this time. Per Dr. Agosto, ok to fed patient as he has not ate in 2 days and willing to stay. Pt provided with a turkey and chix sandwich, saltines, apple juice and water.

## 2019-06-19 NOTE — ED Notes (Signed)
TRANSFER - OUT REPORT:    Verbal report given to Karen, RN(name) on Dustin Carney  being transferred to 3S(unit) for routine progression of care       Report consisted of patient???s Situation, Background, Assessment and   Recommendations(SBAR).     Information from the following report(s) SBAR, ED Summary, MAR and Recent Results was reviewed with the receiving nurse.    Lines:   Peripheral IV 06/18/19 Right Foot (Active)   Site Assessment Clean, dry, & intact 06/18/19 2258   Phlebitis Assessment 0 06/18/19 2258   Infiltration Assessment 0 06/18/19 2258   Dressing Status Clean, dry, & intact 06/18/19 2258       Peripheral IV 06/18/19 Right Antecubital (Active)   Site Assessment Clean, dry, & intact 06/18/19 2305   Phlebitis Assessment 0 06/18/19 2305   Infiltration Assessment 0 06/18/19 2305   Dressing Status Clean, dry, & intact 06/18/19 2305        Opportunity for questions and clarification was provided.      Patient transported with:   Monitor

## 2019-06-27 LAB — PROTHROMBIN TIME CARE EVERYWHERE: INR CARE EVERYWHERE: 1.1 — NL (ref <1.3)

## 2019-06-28 LAB — UNMAPPED LAB RESULTS

## 2019-06-28 LAB — HEMOGLOBIN A1C: HEMOGLOBIN A1C % (INT/EXT): 7.7 % — ABNORMAL HIGH (ref 4.6–5.6)

## 2019-06-28 LAB — COVID-19 CARE EVERYWHERE: COVID-19 CARE EVERYWHERE: NOT DETECTED — NL

## 2019-06-28 LAB — HEMOGLOBIN A1C CARE EVERYWHERE: HEMOGLOBIN A1C CARE EVERYWHERE: 7.7 % — ABNORMAL HIGH (ref 4.6–5.6)

## 2019-07-26 LAB — LAB EXTERNAL RESULT UNMAPPED: COVID-19 CARE EVERYWHERE: NEGATIVE

## 2019-09-03 NOTE — Telephone Encounter (Signed)
Pt name and DOB verified.    Who is your current or most recent primary care provider and which hospital where they are affiliated with? [facility]    How long did you see them for? 1 year    Is there a reason that you are looking for a new provider? Establish care and some issues    Would you like to provide your e-mail to sign up for our My Chart system? na  The benefits of being a My Chart member are:    -Request medical appointments  -View your health summary from the My Chart electronic health record.  -View test results.  -Request prescription refills  -Communicate electronically and securely with your medical care team.    Would you like a text message or letter with your activation code? No Declined 09/03/19    Do you have a provider preference such as male or male? no    When was your last Annual or Well Child Exam? unsure, Please keep in mind that this new patient appointment may not contain an annual exam.     Do you have any ongoing or chronic conditions you are currently being treated   for (e.g. Hypertension, Diabetes, Depression, etc)? If yes list: heart disease not being treated    Have you been seen by a provider for these chronic conditions in the last 6 months? If so, who? no    Do you need an appointment to establish care or is there something more   urgent you need to be seen for? Explain: establish care, heart medication    Are you on any medicines that require a special prescription such as a sleeping pills, pain medication or stimulants? no  (if no go ahead and book appointment, if yes please read the following:    "Please be aware that our provider's may not prescribe these medications at the first visit as they need to get to know more about you and your medical problems/history before they will safely prescribe these" Informed  yes    Do you have any questions about what we discussed? no  (If no go ahead and book, if yes please get the details and send to the practice)    Your new  patient appointment is booked on 04/23 at 10:30 @[facility] @  We do ask if you're not able to keep this appointment please call our office within 24 hours to reschedule to a more convenient time which also allows the opportunity free up the slot for another patient.       **Thank you for choosing [place] for your health care needs.   Mr. ZEDRICK SPRINGSTEEN I do want to let you know that you will need to contact your insurance company and make them aware of your new Primary Care Provider which is [name], MD. Also Mr. MORIAH LOUGHRY a new patient packet will be coming to you. Would you like to have this release of information faxed to you? Mailed  If faxed what is the number? Mailed  If not, you can stop by our practice to complete the form or we can mail you the form. mailed  We would greatly appreciate it if you could complete the release of records and either mail back or drop off at our office within 7 days of receipt.    Notes: [phone] (home)         [phone]      CB#: Jason Coop (home)

## 2019-09-04 NOTE — Telephone Encounter (Signed)
Noted, np letter sent    Dustin Carney

## 2019-09-06 ENCOUNTER — Emergency Department: Admit: 2019-09-06 | Payer: PRIVATE HEALTH INSURANCE | Primary: Family Medicine

## 2019-09-06 ENCOUNTER — Inpatient Hospital Stay
Admit: 2019-09-06 | Discharge: 2019-09-07 | Disposition: A | Discharge status: Home / Self Care | Payer: PRIVATE HEALTH INSURANCE | Attending: Emergency Medicine

## 2019-09-06 DIAGNOSIS — R109 Unspecified abdominal pain: Secondary | ICD-10-CM

## 2019-09-06 LAB — URINALYSIS WITH REFLEX TO CULTURE
Bilirubin, Urine: NEGATIVE
Glucose, Ur: 1000 mg/dL — AB
Ketones, Urine: NEGATIVE mg/dL
Nitrite, Urine: NEGATIVE
Specific Gravity, UA: 1.025 NA (ref 1.008–1.030)
Urobilinogen, UA, POCT: 0.2 EU/dL (ref 0.2–1.0)
pH, UA: 5.5 NA (ref 5.0–8.0)

## 2019-09-06 LAB — URINE MICROSCOPIC WITH REFLEX CULTURE

## 2019-09-06 LAB — UA WITH REFLEX MICRO AND CULTURE
Bilirubin: NEGATIVE
Glucose: 1000 mg/dL — AB
Ketone: NEGATIVE mg/dL
Nitrites: NEGATIVE
Specific gravity: 1.025 (ref 1.008–1.030)
Urobilinogen: 0.2 EU/dL (ref 0.2–1.0)
pH (UA): 5.5 (ref 5.0–8.0)

## 2019-09-06 MED ORDER — ACETAMINOPHEN 325 MG TABLET
325 mg | ORAL | Status: DC
Start: 2019-09-06 — End: 2019-09-07

## 2019-09-06 MED ORDER — METHOCARBAMOL 750 MG TAB
750 mg | Freq: Once | ORAL | Status: DC
Start: 2019-09-06 — End: 2019-09-07

## 2019-09-06 MED FILL — TYLENOL 325 MG TABLET: 325 mg | ORAL | Qty: 2

## 2019-09-06 MED FILL — METHOCARBAMOL 750 MG TAB: 750 mg | ORAL | Qty: 1

## 2019-09-06 NOTE — ED Notes (Signed)
Pt arrives via EMS with c/o right flank pain. Pt states the pain started about 30 min ago. Pt states he did nothing to precipitate the pain. He does have a hx of renal stones. Pt rates pain 9/10.
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
## 2019-09-06 NOTE — ED Notes (Signed)
Patient is alert, oriented, calm, and cooperative at time of discharge.  Patient is able to ambulate with a steady gait independently.  Medications, discharge instructions, and follow-up care discussed/reviewed with patient. Understanding is verbalized at this time.  Vital signs are stable at time of discharge -- out of range numbers discussed with care provider and patient is encouraged to follow up with PCP regarding concerns discussed.  RN, patient, and provider in agreement with discharge plan. No safety concerns at this time.

## 2019-09-06 NOTE — ED Notes (Signed)
Pt resting on stretcher with c/o right flank pain. Pt refused medications. Pt awaiting CT.

## 2019-09-06 NOTE — ED Notes (Signed)
Pt resting on stretcher with c/o right flank pain. Pt refused medications. Pt awaiting CT.
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
## 2019-09-06 NOTE — ED Notes (Signed)
Patient is alert, oriented, calm, and cooperative at time of discharge.  Patient is able to ambulate with a steady gait independently.  Medications, discharge instructions, and follow-up care discussed/reviewed with patient. Understanding is verbalized at this time.  Vital signs are stable at time of discharge -- out of range numbers discussed with care provider and patient is encouraged to follow up with PCP regarding concerns discussed.  RN, patient, and provider in agreement with discharge plan. No safety concerns at this time.

## 2019-09-06 NOTE — ED Triage Notes (Signed)
Pt arrives via EMS with c/o right flank pain. Pt states the pain started about 30 min ago. Pt states he did nothing to precipitate the pain. He does have a hx of renal stones. Pt rates pain 9/10.

## 2019-09-07 LAB — CULTURE, URINE
Colonies Counted: NEGATIVE
Colony Count: NEGATIVE
Culture result:: NO GROWTH
Culture: NO GROWTH

## 2019-09-09 ENCOUNTER — Inpatient Hospital Stay
Admit: 2019-09-09 | Discharge: 2019-09-09 | Disposition: A | Discharge status: Home / Self Care | Payer: MEDICAID | Attending: Physician Assistant

## 2019-09-09 ENCOUNTER — Inpatient Hospital Stay: Admit: 2019-09-09 | Payer: MEDICAID | Attending: Physician Assistant | Primary: Family Medicine

## 2019-09-09 DIAGNOSIS — M24811 Other specific joint derangements of right shoulder, not elsewhere classified: Secondary | ICD-10-CM

## 2019-09-09 NOTE — ED Provider Notes (Signed)
Pleasant 44 year old male presents to the urgent care for right shoulder pain.  Past medical history notable for myocardial infarction x3 with 2 coronary bypass surgeries, history cholecystectomy, hypertension.  The patient notes that he was carrying a heavy trash bag yesterday evening outside.  He notes that was filled with cement pieces as he was working on a project at home.  The patient notes that he lunged the heavy bag over his right shoulder to the ground.  In doing so he had a sudden onset pop sensation in his right shoulder and pain.  Since then he has had ongoing throbbing pain and minimal use of his right upper extremity.  No swelling.  No numbness or tingling.  No grip weakness.  No history of shoulder dislocation.  He denies neck pain.  He denies chest pain or shortness of breath.  He has been taking Tylenol for pain with minimal improvement.  He avoids NSAIDs at baseline.  Presents uncomfortable appearing for further evaluation.           Past Medical History:   Diagnosis Date   ??? CAD (coronary artery disease)     MI x 3   ??? Cardiac revascularization with aortocoronary bypass anastomosis    ??? Chronic dental pain     - multiple teeth remove   ??? Chronic knee pain    ??? Hypertension    ??? Myocardial infarct [facility])    ??? S/P CABG x 1 2007    CABG x 1, 2007, due to congenital heart disease--patient's explanation is that right coronary artery was never in the proper location (perfused left side of heart only) and he had repeat cabage surgery in 2012 to correct this.   ??? Thromboembolus [facility])         Past Surgical History:   Procedure Laterality Date   ??? HX CHOLECYSTECTOMY  08/2010        ??? HX CORONARY ARTERY BYPASS GRAFT  2007, 2012    2007: due to congenital heart disease - patients explanation is that right coronary artery was never in the proper location (perfused left side of heart only) and had repeat CABG surgery in 2012 to correct this   ??? HX OTHER SURGICAL  11/30/2013    TOOTH EXTRACTION         Family  History   Problem Relation Age of Onset   ??? Heart Disease Mother    ??? Cancer Mother         BRAIN CANCER   ??? Diabetes Father    ??? Diabetes Brother    ??? Heart Disease Maternal Grandmother    ??? Dementia Maternal Grandmother    ??? Heart Disease Maternal Grandfather    ??? Heart Disease Paternal Grandmother    ??? Heart Disease Paternal Grandfather    ??? No Known Problems Other    ??? Heart Disease Brother         Social History     Socioeconomic History   ??? Marital status: SINGLE     Spouse name: Not on file   ??? Number of children: 0   ??? Years of education: Not on file   ??? Highest education level: Not on file   Occupational History     Comment: DISABILITY   Social Needs   ??? Financial resource strain: Not on file   ??? Food insecurity     Worry: Not on file     Inability: Not on file   ??? Transportation needs  Medical: Not on file     Non-medical: Not on file   Tobacco Use   ??? Smoking status: Current Some Day Smoker     Packs/day: 0.25     Years: 30.00     Pack years: 7.50     Types: Cigarettes   ??? Smokeless tobacco: Former Neurosurgeon   ??? Tobacco comment: About 40-50 pack years, used to smoke 2 PPD but now down to 2 cigarettes a day   Substance and Sexual Activity   ??? Alcohol use: No   ??? Drug use: No   ??? Sexual activity: Not on file   Lifestyle   ??? Physical activity     Days per week: Not on file     Minutes per session: Not on file   ??? Stress: Not on file   Relationships   ??? Social Wellsite geologist on phone: Not on file     Gets together: Not on file     Attends religious service: Not on file     Active member of club or organization: Not on file     Attends meetings of clubs or organizations: Not on file     Relationship status: Not on file   ??? Intimate partner violence     Fear of current or ex partner: Not on file     Emotionally abused: Not on file     Physically abused: Not on file     Forced sexual activity: Not on file   Other Topics Concern   ??? Military Service Not Asked   ??? Blood Transfusions Not Asked   ??? Caffeine  Concern Not Asked   ??? Occupational Exposure Not Asked   ??? Hobby Hazards Not Asked   ??? Sleep Concern Not Asked   ??? Stress Concern Not Asked   ??? Weight Concern Not Asked   ??? Special Diet Not Asked   ??? Back Care Not Asked   ??? Exercise Not Asked   ??? Bike Helmet Not Asked   ??? Seat Belt Not Asked   ??? Self-Exams Not Asked   Social History Narrative    He is from [place] but moved to [place] at age 82.  Has been between [place] and [address]    Lives with wife, married since 2016     No children    Disability secondary to heart disease and syncopal episode    About 40-50 pack years, used to smoke 2 PPD but now down to 2 cigarettes a day    No EtOH    No drug use                ALLERGIES: Coconut, Codeine, Morphine, Motrin [ibuprofen], Toradol [ketorolac], Tramadol, Iodinated contrast media, and Nitroglycerin    Review of Systems   Respiratory: Negative for shortness of breath.    Cardiovascular: Negative for chest pain.   Musculoskeletal: Negative for neck pain and neck stiffness.        Right shoulder pain and injury   Skin: Negative for color change and wound.   Neurological: Negative for weakness and numbness.       Vitals:    09/09/19 1103   BP: 129/82   Pulse: 80   Resp: 16   Temp: 98.6 ??F (37 ??C)   SpO2: 96%       Physical Exam  Constitutional:       Appearance: Normal appearance. He is not toxic-appearing.   HENT:  Head: Normocephalic and atraumatic.      Right Ear: External ear normal.      Left Ear: External ear normal.      Nose: Nose normal.      Mouth/Throat:      Comments: Mask  Eyes:      Conjunctiva/sclera: Conjunctivae normal.   Neck:      Musculoskeletal: Normal range of motion and neck supple. No muscular tenderness.   Cardiovascular:      Rate and Rhythm: Normal rate and regular rhythm.      Heart sounds: No murmur.   Pulmonary:      Effort: Pulmonary effort is normal. No respiratory distress.      Breath sounds: Normal breath sounds. No wheezing, rhonchi or rales.   Musculoskeletal:      Comments:  Tenderness with palpation over the acromion and of the right clavicle.  Nontender clavicular shaft or sternal clavicle.  Nontender right scapula.  Nonfocal tenderness with palpation through the right glenohumeral shoulder.  No humeral tenderness or elbow tenderness.    Limited abduction of the right shoulder to 90?? secondary to pain with any further passive abduction.  Negative drop-arm test.  Nonspecific tenderness with active internal and external rotation.    Grip strength 5/5 bilaterally.  Sensation intact to light touch.  No edema of the right upper extremity.  Radial pulses 2+ and symmetric bilaterally.  Skin structures intact without pallor ecchymosis or cyanosis.   Skin:     Comments: Midline vertical surgical scar.  Multiple scattered well-healed laparoscopic surgical scars of the abdomen   Neurological:      General: No focal deficit present.      Mental Status: He is alert. Mental status is at baseline.   Psychiatric:         Mood and Affect: Mood normal.         Behavior: Behavior normal.         MDM  Number of Diagnoses or Management Options  Internal derangement of right shoulder  Diagnosis management comments: 44 year old male presents with right shoulder pain after sudden popping sensation with placing a heavy garbage bag on the ground yesterday.  Right upper extremity is neurovascularly intact.  No signs of compartment syndrome or DVT.  No signs of dislocation.  He does have some nonfocal tenderness.  No clavicular tenting.  X-ray of the right shoulder reveals no fracture or dislocation.  Pending formal radiology interpretation.  The patient does exhibit limited range of motion through extension and abduction however after performing range of motion testing the patient is able to put his sweatshirt on without much difficulty.  The patient is provided with a sling for comfort and support.  He will take Tylenol.  He is allergic to Toradol.  He has Lidoderm patches at home.  Deferred opiate  prescription here.  He was prescribed a course of oxycodone on 08/06/2019 which he was not aware of here in the urgent care.  He will follow-up with orthopedics.  Provided with contact information.  Discharged at this time in no acute distress.        ED Course as of Sep 09 1147   Wed Sep 09, 2019   1137 No fracture or dislocation.  Pending formal radiology interpretation.   XR SHOULDER RT AP/LAT MIN 2 V [AM]      ED Course User Index  [AM] Beacher May, PA       Procedures

## 2019-09-09 NOTE — ED Notes (Signed)
Pt states he threw a bag of trash over his shoulder last night. Notes when he put the bag down w/ right hand he felt a pop in right shoulder. Worse pain today.

## 2019-09-09 NOTE — Other (Signed)
Pt states he threw a bag of trash over his shoulder last night. Notes when he put the bag down w/ right hand he felt a pop in right shoulder. Worse pain today.

## 2019-09-09 NOTE — Other (Signed)
Pleasant 44 year old male presents to the urgent care for right shoulder pain.  Past medical history notable for myocardial infarction x3 with 2 coronary bypass surgeries, history cholecystectomy, hypertension.  The patient notes that he was carrying a heavy trash bag yesterday evening outside.  He notes that was filled with cement pieces as he was working on a project at home.  The patient notes that he lunged the heavy bag over his right shoulder to the ground.  In doing so he had a sudden onset pop sensation in his right shoulder and pain.  Since then he has had ongoing throbbing pain and minimal use of his right upper extremity.  No swelling.  No numbness or tingling.  No grip weakness.  No history of shoulder dislocation.  He denies neck pain.  He denies chest pain or shortness of breath.  He has been taking Tylenol for pain with minimal improvement.  He avoids NSAIDs at baseline.  Presents uncomfortable appearing for further evaluation.           Past Medical History:   Diagnosis Date   ??? CAD (coronary artery disease)     MI x 3   ??? Cardiac revascularization with aortocoronary bypass anastomosis    ??? Chronic dental pain     - multiple teeth remove   ??? Chronic knee pain    ??? Hypertension    ??? Myocardial infarct [facility])    ??? S/P CABG x 1 2007    CABG x 1, 2007, due to congenital heart disease--patient's explanation is that right coronary artery was never in the proper location (perfused left side of heart only) and he had repeat cabage surgery in 2012 to correct this.   ??? Thromboembolus [facility])         Past Surgical History:   Procedure Laterality Date   ??? HX CHOLECYSTECTOMY  08/2010        ??? HX CORONARY ARTERY BYPASS GRAFT  2007, 2012    2007: due to congenital heart disease - patients explanation is that right coronary artery was never in the proper location (perfused left side of heart only) and had repeat CABG surgery in 2012 to correct this   ??? HX OTHER SURGICAL  11/30/2013    TOOTH EXTRACTION         Family  History   Problem Relation Age of Onset   ??? Heart Disease Mother    ??? Cancer Mother         BRAIN CANCER   ??? Diabetes Father    ??? Diabetes Brother    ??? Heart Disease Maternal Grandmother    ??? Dementia Maternal Grandmother    ??? Heart Disease Maternal Grandfather    ??? Heart Disease Paternal Grandmother    ??? Heart Disease Paternal Grandfather    ??? No Known Problems Other    ??? Heart Disease Brother         Social History     Socioeconomic History   ??? Marital status: SINGLE     Spouse name: Not on file   ??? Number of children: 0   ??? Years of education: Not on file   ??? Highest education level: Not on file   Occupational History     Comment: DISABILITY   Social Needs   ??? Financial resource strain: Not on file   ??? Food insecurity     Worry: Not on file     Inability: Not on file   ??? Transportation needs  Medical: Not on file     Non-medical: Not on file   Tobacco Use   ??? Smoking status: Current Some Day Smoker     Packs/day: 0.25     Years: 30.00     Pack years: 7.50     Types: Cigarettes   ??? Smokeless tobacco: Former User   ??? Tobacco comment: About 40-50 pack years, used to smoke 2 PPD but now down to 2 cigarettes a day   Substance and Sexual Activity   ??? Alcohol use: No   ??? Drug use: No   ??? Sexual activity: Not on file   Lifestyle   ??? Physical activity     Days per week: Not on file     Minutes per session: Not on file   ??? Stress: Not on file   Relationships   ??? Social connections     Talks on phone: Not on file     Gets together: Not on file     Attends religious service: Not on file     Active member of club or organization: Not on file     Attends meetings of clubs or organizations: Not on file     Relationship status: Not on file   ??? Intimate partner violence     Fear of current or ex partner: Not on file     Emotionally abused: Not on file     Physically abused: Not on file     Forced sexual activity: Not on file   Other Topics Concern   ??? Military Service Not Asked   ??? Blood Transfusions Not Asked   ??? Caffeine  Concern Not Asked   ??? Occupational Exposure Not Asked   ??? Hobby Hazards Not Asked   ??? Sleep Concern Not Asked   ??? Stress Concern Not Asked   ??? Weight Concern Not Asked   ??? Special Diet Not Asked   ??? Back Care Not Asked   ??? Exercise Not Asked   ??? Bike Helmet Not Asked   ??? Seat Belt Not Asked   ??? Self-Exams Not Asked   Social History Narrative    He is from [place] but moved to [place] at age 18.  Has been between [place] and [place]    Lives with wife, married since 2016     No children    Disability secondary to heart disease and syncopal episode    About 40-50 pack years, used to smoke 2 PPD but now down to 2 cigarettes a day    No EtOH    No drug use                ALLERGIES: Coconut, Codeine, Morphine, Motrin [ibuprofen], Toradol [ketorolac], Tramadol, Iodinated contrast media, and Nitroglycerin    Review of Systems   Respiratory: Negative for shortness of breath.    Cardiovascular: Negative for chest pain.   Musculoskeletal: Negative for neck pain and neck stiffness.        Right shoulder pain and injury   Skin: Negative for color change and wound.   Neurological: Negative for weakness and numbness.       Vitals:    09/09/19 1103   BP: 129/82   Pulse: 80   Resp: 16   Temp: 98.6 ??F (37 ??C)   SpO2: 96%       Physical Exam  Constitutional:       Appearance: Normal appearance. He is not toxic-appearing.   HENT:        Head: Normocephalic and atraumatic.      Right Ear: External ear normal.      Left Ear: External ear normal.      Nose: Nose normal.      Mouth/Throat:      Comments: Mask  Eyes:      Conjunctiva/sclera: Conjunctivae normal.   Neck:      Musculoskeletal: Normal range of motion and neck supple. No muscular tenderness.   Cardiovascular:      Rate and Rhythm: Normal rate and regular rhythm.      Heart sounds: No murmur.   Pulmonary:      Effort: Pulmonary effort is normal. No respiratory distress.      Breath sounds: Normal breath sounds. No wheezing, rhonchi or rales.   Musculoskeletal:      Comments:  Tenderness with palpation over the acromion and of the right clavicle.  Nontender clavicular shaft or sternal clavicle.  Nontender right scapula.  Nonfocal tenderness with palpation through the right glenohumeral shoulder.  No humeral tenderness or elbow tenderness.    Limited abduction of the right shoulder to 90?? secondary to pain with any further passive abduction.  Negative drop-arm test.  Nonspecific tenderness with active internal and external rotation.    Grip strength 5/5 bilaterally.  Sensation intact to light touch.  No edema of the right upper extremity.  Radial pulses 2+ and symmetric bilaterally.  Skin structures intact without pallor ecchymosis or cyanosis.   Skin:     Comments: Midline vertical surgical scar.  Multiple scattered well-healed laparoscopic surgical scars of the abdomen   Neurological:      General: No focal deficit present.      Mental Status: He is alert. Mental status is at baseline.   Psychiatric:         Mood and Affect: Mood normal.         Behavior: Behavior normal.         MDM  Number of Diagnoses or Management Options  Internal derangement of right shoulder  Diagnosis management comments: 44 year old male presents with right shoulder pain after sudden popping sensation with placing a heavy garbage bag on the ground yesterday.  Right upper extremity is neurovascularly intact.  No signs of compartment syndrome or DVT.  No signs of dislocation.  He does have some nonfocal tenderness.  No clavicular tenting.  X-ray of the right shoulder reveals no fracture or dislocation.  Pending formal radiology interpretation.  The patient does exhibit limited range of motion through extension and abduction however after performing range of motion testing the patient is able to put his sweatshirt on without much difficulty.  The patient is provided with a sling for comfort and support.  He will take Tylenol.  He is allergic to Toradol.  He has Lidoderm patches at home.  Deferred opiate  prescription here.  He was prescribed a course of oxycodone on 08/06/2019 which he was not aware of here in the urgent care.  He will follow-up with orthopedics.  Provided with contact information.  Discharged at this time in no acute distress.        ED Course as of Sep 09 1147   Wed Sep 09, 2019   1137 No fracture or dislocation.  Pending formal radiology interpretation.   XR SHOULDER RT AP/LAT MIN 2 V [AM]      ED Course User Index  [AM] Beacher May, PA       Procedures

## 2019-09-09 NOTE — Telephone Encounter (Signed)
Received notification that patient was seen at [facility] on 09/09/19    Dx rt shoulder injury    DOI ?    xrays done at [facility]    Not wc injury    Lunged heavey trash back over rt shoulder and when putting back on ground felt a pop    Prior sx?    Called patient lmom asking him to call if he would like an appt   Karolee Stamps

## 2019-09-10 ENCOUNTER — Inpatient Hospital Stay
Admit: 2019-09-10 | Discharge: 2019-09-10 | Disposition: A | Discharge status: Home / Self Care | Payer: MEDICAID | Attending: Emergency Medicine

## 2019-09-10 DIAGNOSIS — R079 Chest pain, unspecified: Secondary | ICD-10-CM

## 2019-09-10 LAB — COMPREHENSIVE METABOLIC PANEL
ALT: 94 U/L — ABNORMAL HIGH (ref 12–78)
AST: 44 U/L — ABNORMAL HIGH (ref 10–37)
Albumin: 3.9 g/dL (ref 3.4–5.0)
Alkaline Phosphatase: 76 U/L (ref 43–117)
BUN: 9 MG/DL (ref 7–22)
CO2: 29 mmol/L (ref 21–32)
Calcium: 9.7 MG/DL (ref 8.5–10.1)
Chloride: 106 mmol/L (ref 98–108)
Creatinine: 0.95 MG/DL (ref 0.70–1.30)
EGFR IF NonAfrican American: >60 mL/min/{1.73_m2} (ref >60)
GFR African American: >60 mL/min/{1.73_m2} (ref >60)
Glucose: 96 mg/dL (ref 74–106)
Potassium: 3.9 mmol/L (ref 3.4–5.1)
Sodium: 138 mmol/L (ref 136–145)
Total Bilirubin: 0.4 mg/dL (ref 0.00–1.00)
Total Protein: 8.7 g/dL — ABNORMAL HIGH (ref 6.4–8.2)

## 2019-09-10 LAB — POCT GLUCOSE: POC Glucose: 107 mg/dL

## 2019-09-10 LAB — CBC WITH AUTO DIFFERENTIAL
Basophils %: 0 % (ref 0–2)
Basophils Absolute: 0 10*3/uL (ref 0.0–0.1)
Eosinophils %: 2 % (ref 0–5)
Eosinophils Absolute: 0.1 10*3/uL (ref 0.0–0.5)
Granulocyte Absolute Count: 0 10*3/uL (ref 0.00–0.04)
Hematocrit: 46.9 % (ref 42.0–52.0)
Hemoglobin: 15.5 g/dL (ref 14.0–18.0)
Immature Granulocytes: 0 % (ref 0.0–0.6)
Lymphocytes %: 30 % (ref 14–46)
Lymphocytes Absolute: 1.7 10*3/uL (ref 1.3–3.6)
MCH: 30.6 PG (ref 27.0–31.0)
MCHC: 33 g/dL (ref 33.0–37.0)
MCV: 92.7 FL (ref 80.0–94.0)
MPV: 10.8 FL — ABNORMAL HIGH (ref 7.4–10.4)
Monocytes %: 8 % (ref 5–12)
Monocytes Absolute: 0.4 10*3/uL (ref 0.3–0.8)
NRBC Absolute: 0 10*3/uL
Neutrophils %: 60 % (ref 47–80)
Neutrophils Absolute: 3.3 10*3/uL (ref 1.6–6.1)
Nucleated RBCs: 0 PER 100 WBC
Platelets: 227 10*3/uL (ref 130–400)
RBC: 5.06 M/uL (ref 4.70–6.10)
RDW: 12.9 % (ref 11.5–14.5)
WBC: 5.5 10*3/uL (ref 4.5–10.9)

## 2019-09-10 LAB — TROPONIN: Troponin I: 0.022 ng/mL (ref 0.000–0.045)

## 2019-09-10 LAB — CBC WITH AUTOMATED DIFF
ABS. BASOPHILS: 0 10*3/uL (ref 0.0–0.1)
ABS. EOSINOPHILS: 0.1 10*3/uL (ref 0.0–0.5)
ABS. IMM. GRANS.: 0 10*3/uL (ref 0.00–0.04)
ABS. LYMPHOCYTES: 1.7 10*3/uL (ref 1.3–3.6)
ABS. MONOCYTES: 0.4 10*3/uL (ref 0.3–0.8)
ABS. NEUTROPHILS: 3.3 10*3/uL (ref 1.6–6.1)
ABSOLUTE NRBC: 0 10*3/uL
BASOPHILS: 0 % (ref 0–2)
EOSINOPHILS: 2 % (ref 0–5)
HCT: 46.9 % (ref 42.0–52.0)
HGB: 15.5 g/dL (ref 14.0–18.0)
IMMATURE GRANULOCYTES: 0 % (ref 0.0–0.6)
LYMPHOCYTES: 30 % (ref 14–46)
MCH: 30.6 PG (ref 27.0–31.0)
MCHC: 33 g/dL (ref 33.0–37.0)
MCV: 92.7 FL (ref 80.0–94.0)
MONOCYTES: 8 % (ref 5–12)
MPV: 10.8 FL — ABNORMAL HIGH (ref 7.4–10.4)
NEUTROPHILS: 60 % (ref 47–80)
NRBC: 0 PER 100 WBC
PLATELET: 227 10*3/uL (ref 130–400)
RBC: 5.06 M/uL (ref 4.70–6.10)
RDW: 12.9 % (ref 11.5–14.5)
WBC: 5.5 10*3/uL (ref 4.5–10.9)

## 2019-09-10 LAB — GLUCOSE, POC: Glucose (POC): 107 mg/dL

## 2019-09-10 LAB — METABOLIC PANEL, COMPREHENSIVE
ALT (SGPT): 94 U/L — ABNORMAL HIGH (ref 12–78)
AST (SGOT): 44 U/L — ABNORMAL HIGH (ref 10–37)
Albumin: 3.9 g/dL (ref 3.4–5.0)
Alk. phosphatase: 76 U/L (ref 43–117)
BUN: 9 MG/DL (ref 7–22)
Bilirubin, total: 0.4 mg/dL (ref 0.00–1.00)
CO2: 29 mmol/L (ref 21–32)
Calcium: 9.7 MG/DL (ref 8.5–10.1)
Chloride: 106 mmol/L (ref 98–108)
Creatinine: 0.95 MG/DL (ref 0.70–1.30)
GFR est AA: >60 mL/min/{1.73_m2} (ref >60)
GFR est non-AA: >60 mL/min/{1.73_m2} (ref >60)
Glucose: 96 mg/dL (ref 74–106)
Potassium: 3.9 mmol/L (ref 3.4–5.1)
Protein, total: 8.7 g/dL — ABNORMAL HIGH (ref 6.4–8.2)
Sodium: 138 mmol/L (ref 136–145)

## 2019-09-10 LAB — TROPONIN I: Troponin-I, Qt.: 0.022 ng/mL (ref 0.000–0.045)

## 2019-09-10 NOTE — ED Notes (Signed)
Pt comes in with chest pain midsternal that started at 4pm and states it radiates to left arm and jaw. Pain is 9/10 according to pt. He arrives and has minimal interaction or eye contact with [place]. He is watching something on his phone and only interacts when a question is asked. He states he has had multiple mi 's and had bypass surgery. Last heart attack was 2012. Unable to get iv on arrival due to lots of scar tissue along viens. Pt denies drug use.

## 2019-09-10 NOTE — ED Notes (Signed)
Pt rang his bell and stated he wanted to talk to the doctor because he is feeling better and just wants to follow up with a cardiologist. He also states that he needs a cab ride home because he came by ambulance. I explained that we needed him to try to find a ride first. He stated I have tried 9 people so far and there is nobody. Md aware.

## 2019-09-10 NOTE — ED Provider Notes (Signed)
HPI chief complaint chest discomfort.  44 year old the above concern.  History of cardiac surgery in the past.  He is a smoker.  History of MI.  Acute onset of left substernal chest discomfort 40 minutes ago while at rest.  No prodrome.  No history of DVT or PE.  EMS was called.  They gave him aspirin.  He declined nitroglycerin.    I have met this patient before for similar issues most recently for a noncardiac issue 4 days ago for flank pain.  He states he has a history of kidney stones but I could not find any on previous scans and I did scan on that day and there is no kidney stone.  The urine test showed white cells but urine culture was negative when I looked at today.    Looking through his medical record it looks like he has had had 5 or 6 visits to various ERs since January 1st this year for chest pain with no obvious cardiac event.  No he states he has a cardiologist in [place] and he needs to get a cardiologist here in [place].  He states he had a cardiologist named Dr. Hardin Negus who is retired.      No abdominal pain.  No diaphoresis.  No nausea vomiting.  No ankle pain.  No shortness of breath.    Past Medical History:   Diagnosis Date   ??? CAD (coronary artery disease)     MI x 3   ??? Cardiac revascularization with aortocoronary bypass anastomosis    ??? Chronic dental pain     - multiple teeth remove   ??? Chronic knee pain    ??? Hypertension    ??? Myocardial infarct [facility])    ??? S/P CABG x 1 2007    CABG x 1, 2007, due to congenital heart disease--patient's explanation is that right coronary artery was never in the proper location (perfused left side of heart only) and he had repeat cabage surgery in 2012 to correct this.   ??? Thromboembolus [facility])        Past Surgical History:   Procedure Laterality Date   ??? HX CHOLECYSTECTOMY  08/2010        ??? HX CORONARY ARTERY BYPASS GRAFT  2007, 2012    2007: due to congenital heart disease - patients explanation is that right coronary artery was never in the proper location  (perfused left side of heart only) and had repeat CABG surgery in 2012 to correct this   ??? HX OTHER SURGICAL  11/30/2013    TOOTH EXTRACTION         Family History:   Problem Relation Age of Onset   ??? Heart Disease Mother    ??? Cancer Mother         BRAIN CANCER   ??? Diabetes Father    ??? Diabetes Brother    ??? Heart Disease Maternal Grandmother    ??? Dementia Maternal Grandmother    ??? Heart Disease Maternal Grandfather    ??? Heart Disease Paternal Grandmother    ??? Heart Disease Paternal Grandfather    ??? No Known Problems Other    ??? Heart Disease Brother        Social History     Socioeconomic History   ??? Marital status: SINGLE     Spouse name: Not on file   ??? Number of children: 0   ??? Years of education: Not on file   ??? Highest education level: Not on file  Occupational History     Comment: DISABILITY   Social Needs   ??? Financial resource strain: Not on file   ??? Food insecurity     Worry: Not on file     Inability: Not on file   ??? Transportation needs     Medical: Not on file     Non-medical: Not on file   Tobacco Use   ??? Smoking status: Current Some Day Smoker     Packs/day: 0.25     Years: 30.00     Pack years: 7.50     Types: Cigarettes   ??? Smokeless tobacco: Former Systems developer   ??? Tobacco comment: About 40-50 pack years, used to smoke 2 PPD but now down to 2 cigarettes a day   Substance and Sexual Activity   ??? Alcohol use: No   ??? Drug use: No   ??? Sexual activity: Not on file   Lifestyle   ??? Physical activity     Days per week: Not on file     Minutes per session: Not on file   ??? Stress: Not on file   Relationships   ??? Social Product manager on phone: Not on file     Gets together: Not on file     Attends religious service: Not on file     Active member of club or organization: Not on file     Attends meetings of clubs or organizations: Not on file     Relationship status: Not on file   ??? Intimate partner violence     Fear of current or ex partner: Not on file     Emotionally abused: Not on file     Physically abused:  Not on file     Forced sexual activity: Not on file   Other Topics Concern   ??? Military Service Not Asked   ??? Blood Transfusions Not Asked   ??? Caffeine Concern Not Asked   ??? Occupational Exposure Not Asked   ??? Hobby Hazards Not Asked   ??? Sleep Concern Not Asked   ??? Stress Concern Not Asked   ??? Weight Concern Not Asked   ??? Special Diet Not Asked   ??? Back Care Not Asked   ??? Exercise Not Asked   ??? Bike Helmet Not Asked   ??? Seat Belt Not Asked   ??? Self-Exams Not Asked   Social History Narrative    He is from [place] but moved to [place] at age 69.  Has been between [place] and [place] with wife, married since 2016     No children    Disability secondary to heart disease and syncopal episode    About 40-50 pack years, used to smoke 2 PPD but now down to 2 cigarettes a day    No EtOH    No drug use         ALLERGIES: Coconut, Codeine, Morphine, Motrin [ibuprofen], Toradol [ketorolac], Tramadol, Iodinated contrast media, and Nitroglycerin    Review of Systems   Review of systems:  Please see HPI.  Otherwise reviewed negative.    There were no vitals filed for this visit.         Physical Exam General appearance:  Well-developed well-nourished patient  sitting up in bed.  When I enter the room he is watching something on his cell phone and he seems to be more stated in his cellphone then my questions but he does answer  them.  Vital signs reviewed.  No fever.  No tachycardia.  No hypoxia.  He is in no distress.  He is a good historian.    HEENT exam:  Normocephalic atraumatic pupils equally round and reactive to light.  Extraocular muscles intact. Pharynx exam:  No erythema.  No exudate. No trismus.  Neck exam:  Supple, full range of motion.  Lung exam:  Clear to auscultation. No rhonchi.  No crackles.  No retractions.  No wheezes.  Heart exam:  Regular rate rhythm. No murmurs  Chest wall exam there is a well-healed median sternotomy scar.    Abdominal exam:  Nondistended nontender. Bowel sounds are present.  No  guarding or rebound.  MSK exam:  Full range of motion all 4 extremities without pain. No visible cellulitis on the exposed skin.  No calf pain.  No pedal edema.  Neurologic exam:  Awake and alert.  Follows commands.  Motor 5/5.  Sensation intact.  Grossly nonfocal.    MDM       ICD-10-CM ICD-9-CM    1. Chest pain, unspecified type  R07.9 786.50          [age]-one year patient history of heart problems and cardiac surgery in the past.  Chest pain at rest 40 minutes prior to arrival.  Appears to be in no discomfort now.  His EKG shows no acute changes.  I will get 2 cardiac enzymes since his pain to started 40 minutes prior to arrival.  He has had numerous chest x-rays since January 1st.  He has clear lungs and no hypoxia I do not think he needs yet another chest x-ray.  He has no history of DVT or PE.    Looking through other emergency department notes multiple physicians have commented on drug-seeking behavior.    He has however a documented history of cardiac disease.  I will do the blood tests and then reassess the patient.    ED Course as of Sep 09 1804   Thu Sep 10, 2019   1804 Update:  Patient is requesting to be discharged now before test results are back.  The CBCs back which shows no leukocytosis.  No anemia.  I saw him at the bedside at 6:04 p.m..  He is arguing with somebody on the phone.  He is requesting discharge.  He is aware I do not have a diagnosis.  He still wants to go home.      [RR]      ED Course User Index  [RR] Lucillie Garfinkel, MD     Uptake:  After his discharge the troponin came back at 0.022.    EKG    Date/Time: 09/10/2019 4:42 PM  Performed by: Lucillie Garfinkel, MD  Authorized by: Lucillie Garfinkel, MD     Comments:      EMS EKG:  Normal sinus rhythm 88 beats per minute.  No ST segment elevation.  Emergency department EKG:  Normal sinus rhythm 83 beats per minute.  No ST segment elevation.  QTC 412 milliseconds.          NIH Stroke Scale

## 2019-09-10 NOTE — ED Notes (Signed)
Pt removed himself from cont cardiac monitoring and is requesting discharge at this time.  Dr. Purcell Mouton made aware

## 2019-09-10 NOTE — ED Notes (Signed)
Pt was discharged per his request. He is pain free. He will return if symptoms reoccur. He requested a cab pass which I gave but he wanted it for a different address that we have. He stated yes I am going to my girlfriend.

## 2019-09-10 NOTE — ED Provider Notes (Addendum)
HPI chief complaint chest discomfort.  44-year-old the above concern.  History of cardiac surgery in the past.  He is a smoker.  History of MI.  Acute onset of left substernal chest discomfort 40 minutes ago while at rest.  No prodrome.  No history of DVT or PE.  EMS was called.  They gave him aspirin.  He declined nitroglycerin.    I have met this patient before for similar issues most recently for a noncardiac issue 42 days ago for flank pain.  He states he has a history of kidney stones but I could not find any on previous scans and I did scan on that day and there is no kidney stone.  The urine test showed white cells but urine culture was negative when I looked at today.    Looking through his medical record it looks like he has had had 5 or 6 visits to various ERs since January 1st this year for chest pain with no obvious cardiac event.  No he states he has a cardiologist in [place] and he needs to get a cardiologist here in [place].  He states he had a cardiologist named Dr. Phillips who is retired.      No abdominal pain.  No diaphoresis.  No nausea vomiting.  No ankle pain.  No shortness of breath.    Past Medical History:   Diagnosis Date   ??? CAD (coronary artery disease)     MI x 3   ??? Cardiac revascularization with aortocoronary bypass anastomosis    ??? Chronic dental pain     - multiple teeth remove   ??? Chronic knee pain    ??? Hypertension    ??? Myocardial infarct (HCC)    ??? S/P CABG x 1 2007    CABG x 1, 2007, due to congenital heart disease--patient's explanation is that right coronary artery was never in the proper location (perfused left side of heart only) and he had repeat cabage surgery in 2012 to correct this.   ??? Thromboembolus (HCC)        Past Surgical History:   Procedure Laterality Date   ??? HX CHOLECYSTECTOMY  08/2010        ??? HX CORONARY ARTERY BYPASS GRAFT  2007, 2012    2007: due to congenital heart disease - patients explanation is that right coronary artery was never in the proper location  (perfused left side of heart only) and had repeat CABG surgery in 2012 to correct this   ??? HX OTHER SURGICAL  11/30/2013    TOOTH EXTRACTION         Family History:   Problem Relation Age of Onset   ??? Heart Disease Mother    ??? Cancer Mother         BRAIN CANCER   ??? Diabetes Father    ??? Diabetes Brother    ??? Heart Disease Maternal Grandmother    ??? Dementia Maternal Grandmother    ??? Heart Disease Maternal Grandfather    ??? Heart Disease Paternal Grandmother    ??? Heart Disease Paternal Grandfather    ??? No Known Problems Other    ??? Heart Disease Brother        Social History     Socioeconomic History   ??? Marital status: SINGLE     Spouse name: Not on file   ??? Number of children: 0   ??? Years of education: Not on file   ??? Highest education level: Not on file     Occupational History     Comment: DISABILITY   Social Needs   ??? Financial resource strain: Not on file   ??? Food insecurity     Worry: Not on file     Inability: Not on file   ??? Transportation needs     Medical: Not on file     Non-medical: Not on file   Tobacco Use   ??? Smoking status: Current Some Day Smoker     Packs/day: 0.25     Years: 30.00     Pack years: 7.50     Types: Cigarettes   ??? Smokeless tobacco: Former User   ??? Tobacco comment: About 40-50 pack years, used to smoke 2 PPD but now down to 2 cigarettes a day   Substance and Sexual Activity   ??? Alcohol use: No   ??? Drug use: No   ??? Sexual activity: Not on file   Lifestyle   ??? Physical activity     Days per week: Not on file     Minutes per session: Not on file   ??? Stress: Not on file   Relationships   ??? Social connections     Talks on phone: Not on file     Gets together: Not on file     Attends religious service: Not on file     Active member of club or organization: Not on file     Attends meetings of clubs or organizations: Not on file     Relationship status: Not on file   ??? Intimate partner violence     Fear of current or ex partner: Not on file     Emotionally abused: Not on file     Physically abused:  Not on file     Forced sexual activity: Not on file   Other Topics Concern   ??? Military Service Not Asked   ??? Blood Transfusions Not Asked   ??? Caffeine Concern Not Asked   ??? Occupational Exposure Not Asked   ??? Hobby Hazards Not Asked   ??? Sleep Concern Not Asked   ??? Stress Concern Not Asked   ??? Weight Concern Not Asked   ??? Special Diet Not Asked   ??? Back Care Not Asked   ??? Exercise Not Asked   ??? Bike Helmet Not Asked   ??? Seat Belt Not Asked   ??? Self-Exams Not Asked   Social History Narrative    He is from [place] but moved to [place] at age 18.  Has been between [place] and [place]    Lives with wife, married since 2016     No children    Disability secondary to heart disease and syncopal episode    About 40-50 pack years, used to smoke 2 PPD but now down to 2 cigarettes a day    No EtOH    No drug use         ALLERGIES: Coconut, Codeine, Morphine, Motrin [ibuprofen], Toradol [ketorolac], Tramadol, Iodinated contrast media, and Nitroglycerin    Review of Systems   Review of systems:  Please see HPI.  Otherwise reviewed negative.    There were no vitals filed for this visit.         Physical Exam General appearance:  Well-developed well-nourished patient  sitting up in bed.  When I enter the room he is watching something on his cell phone and he seems to be more stated in his cellphone then my questions but he does answer   them.  Vital signs reviewed.  No fever.  No tachycardia.  No hypoxia.  He is in no distress.  He is a good historian.    HEENT exam:  Normocephalic atraumatic pupils equally round and reactive to light.  Extraocular muscles intact. Pharynx exam:  No erythema.  No exudate. No trismus.  Neck exam:  Supple, full range of motion.  Lung exam:  Clear to auscultation. No rhonchi.  No crackles.  No retractions.  No wheezes.  Heart exam:  Regular rate rhythm. No murmurs  Chest wall exam there is a well-healed median sternotomy scar.    Abdominal exam:  Nondistended nontender. Bowel sounds are present.  No  guarding or rebound.  MSK exam:  Full range of motion all 4 extremities without pain. No visible cellulitis on the exposed skin.  No calf pain.  No pedal edema.  Neurologic exam:  Awake and alert.  Follows commands.  Motor 5/5.  Sensation intact.  Grossly nonfocal.    MDM       ICD-10-CM ICD-9-CM    1. Chest pain, unspecified type  R07.9 786.50          Fort[age]-one year patient history of heart problems and cardiac surgery in the past.  Chest pain at rest 40 minutes prior to arrival.  Appears to be in no discomfort now.  His EKG shows no acute changes.  I will get 2 cardiac enzymes since his pain to started 40 minutes prior to arrival.  He has had numerous chest x-rays since January 1st.  He has clear lungs and no hypoxia I do not think he needs yet another chest x-ray.  He has no history of DVT or PE.    Looking through other emergency department notes multiple physicians have commented on drug-seeking behavior.    He has however a documented history of cardiac disease.  I will do the blood tests and then reassess the patient.    ED Course as of Sep 09 1804   Thu Sep 10, 2019   1804 Update:  Patient is requesting to be discharged now before test results are back.  The CBCs back which shows no leukocytosis.  No anemia.  I saw him at the bedside at 6:04 p.m..  He is arguing with somebody on the phone.  He is requesting discharge.  He is aware I do not have a diagnosis.  He still wants to go home.      [RR]      ED Course User Index  [RR] Indica Marcott M, MD     Uptake:  After his discharge the troponin came back at 0.022.    EKG    Date/Time: 09/10/2019 4:42 PM  Performed by: Jahlia Omura M, MD  Authorized by: Anabell Swint M, MD     Comments:      EMS EKG:  Normal sinus rhythm 88 beats per minute.  No ST segment elevation.  Emergency department EKG:  Normal sinus rhythm 83 beats per minute.  No ST segment elevation.  QTC 412 milliseconds.          NIH Stroke Scale

## 2019-09-10 NOTE — ED Notes (Signed)
Pt rang his bell and stated he wanted to talk to the doctor because he is feeling better and just wants to follow up with a cardiologist. He also states that he needs a cab ride home because he came by ambulance. I explained that we needed him to try to find a ride first. He stated I have tried 9 people so far and there is nobody. Md aware.

## 2019-09-10 NOTE — ED Triage Notes (Signed)
Pt comes in with chest pain midsternal that started at 4pm and states it radiates to left arm and jaw. Pain is 9/10 according to pt. He arrives and has minimal interaction or eye contact with us. He is watching something on his phone and only interacts when a question is asked. He states he has had multiple mi 's and had bypass surgery. Last heart attack was 2012. Unable to get iv on arrival due to lots of scar tissue along viens. Pt denies drug use.

## 2019-09-10 NOTE — ED Notes (Signed)
Pt was discharged per his request. He is pain free. He will return if symptoms reoccur. He requested a cab pass which I gave but he wanted it for a different address that we have. He stated yes I am going to my girlfriend.

## 2019-09-10 NOTE — ED Notes (Signed)
Pt removed himself from cont cardiac monitoring and is requesting discharge at this time.  Dr. Reilly made aware

## 2019-09-11 ENCOUNTER — Ambulatory Visit
Admit: 2019-09-11 | Discharge: 2019-09-11 | Payer: PRIVATE HEALTH INSURANCE | Attending: Family Medicine | Primary: Family Medicine

## 2019-09-11 ENCOUNTER — Ambulatory Visit: Attending: Family Medicine | Primary: Family Medicine

## 2019-09-11 DIAGNOSIS — G8929 Other chronic pain: Secondary | ICD-10-CM

## 2019-09-11 MED ORDER — NALOXONE 4 MG/ACTUATION NASAL SPRAY
4 mg/actuation | NASAL | 3 refills | Status: DC
Start: 2019-09-11 — End: 2020-08-16

## 2019-09-11 MED ORDER — OXYCODONE-ACETAMINOPHEN 5 MG-325 MG TAB
5-325 mg | ORAL_TABLET | Freq: Three times a day (TID) | ORAL | 0 refills | Status: AC | PRN
Start: 2019-09-11 — End: 2019-09-16

## 2019-09-11 MED ORDER — METOPROLOL SUCCINATE SR 50 MG 24 HR TAB
50 mg | ORAL_TABLET | Freq: Every day | ORAL | 0 refills | Status: DC
Start: 2019-09-11 — End: 2020-08-16

## 2019-09-11 MED ORDER — ATORVASTATIN 40 MG TAB
40 mg | ORAL_TABLET | Freq: Every evening | ORAL | 0 refills | Status: DC
Start: 2019-09-11 — End: 2020-08-16

## 2019-09-11 MED ORDER — METFORMIN 500 MG TAB
500 mg | ORAL_TABLET | Freq: Two times a day (BID) | ORAL | 0 refills | Status: DC
Start: 2019-09-11 — End: 2020-08-16
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
## 2019-09-11 NOTE — Patient Instructions (Signed)
Learning About the Risk of Heart Attack and Stroke With Diabetes  How are diabetes, heart attack, and stroke connected?     For some people, diabetes can cause problems that increase the risk of a heart attack or stroke.  Many things can lead to a heart attack or stroke. These include high blood sugar, insulin resistance, high cholesterol, and high blood pressure. Lifestyle and genetics may also play a part.  But here's the good news: The things you're doing to stay healthy with diabetes also help your heart and blood vessels. That means eating healthy foods, quitting smoking, and getting exercise.  What increases your risk for heart attack and stroke?  When you have diabetes, your risk for heart attack and stroke is even higher if you have:  ?? High blood pressure. It pushes blood through the arteries with too much force. Over time, this damages the walls of the arteries.  ?? High cholesterol. It causes the buildup of a kind of fat inside the blood vessel walls. This buildup can lower blood flow to the heart muscle and raise your risk for having a heart attack or stroke.  ?? Kidney damage. It shares many of the risk factors for heart attack and stroke (such as high blood sugar, high blood pressure, and high cholesterol).  How do you keep your heart healthy when you have diabetes?  Managing your diabetes and keeping your heart and blood vessels healthy are both important. Here are some things you can do.  ?? Test your blood sugar levels and get your diabetes tests on schedule.   Try to keep your numbers within your target range.  ?? Keep track of your blood pressure.   Your doctor will give you a goal that's right for you. If your blood pressure is high, your treatment may also include medicine. Changes in your lifestyle, such as staying at a healthy weight, may also help you lower your blood pressure.  ?? Eat heart-healthy foods.   These include fruits, vegetables, whole grains, fish, and low-fat or nonfat dairy  foods. Limit sodium, alcohol, and sweets.  ?? If your doctor recommends it, get more exercise.   Walking is a good choice. Bit by bit, increase the amount you walk every day. Try for at least 30 minutes on most days of the week.  ?? Don't smoke.   Smoking can make diabetes worse and increase your risk of heart attack or stroke. If you need help quitting, talk to your doctor about stop-smoking programs and medicines. These can increase your chances of quitting for good.  ?? Think about taking medicines for your heart.   For example, your doctor may suggest taking a statin or daily aspirin.  Where can you learn more?  Go to [email or web address]-rivera.info/  Enter [phone] in the search box to learn more about "Learning About the Risk of Heart Attack and Stroke With Diabetes."  Current as of: January 19, 2019??????????????????????????????Content Version: 12.8  ?? 2006-2021 [employer].   Care instructions adapted under license by [employer] (which disclaims liability or warranty for this information). If you have questions about a medical condition or this instruction, always ask your healthcare professional. [place] any warranty or liability for your use of this information.         Reducing Risk of Another Heart Attack With Medicine: Care Instructions  Your Care Instructions     After a heart attack, medicines help lower your risk of having another one. These medicines  include:  ?? ACE inhibitors or ARBs. These are types of blood pressure medicines.  ?? Statins and other cholesterol medicines. These lower cholesterol.  ?? Aspirin and other antiplatelets. These medicines prevent blood clots from forming in your blood vessels. This can help prevent a heart attack.  ?? Beta-blocker medicines. These are a type of blood pressure and heart medicine.  All medicines can cause side effects. So it is important to understand the pros and cons of any medicine you take. It is also important to  take your medicines exactly as your doctor tells you to.  Follow-up care is a key part of your treatment and safety. Be sure to make and go to all appointments, and call your doctor if you are having problems. It's also a good idea to know your test results and keep a list of the medicines you take.  ACE inhibitors  ACE (angiotensin-converting enzyme) inhibitors are used for three main reasons. They lower blood pressure. They protect the kidneys. And they prevent heart attacks and strokes. Examples include:  ?? Benazepril (Lotensin).  ?? Lisinopril (Prinivil).  ?? Ramipril (Altace).  An angiotensin II receptor blocker (ARB) may be used instead of an ACE inhibitor. ARBs help you in the same ways as ACE inhibitors. Examples include:  ?? Candesartan (Atacand).  ?? Irbesartan (Avapro).  ?? Losartan (Cozaar).  Before you start taking an ACE inhibitor or an ARB, make sure your doctor knows if you:  ?? Take water pills (diuretics).  ?? Take potassium pills or use salt substitutes.  ?? Are pregnant or breastfeeding.  ?? Had a kidney transplant or other kidney problems.  ACE inhibitors and ARBs can cause side effects. Call your doctor right away if you have:  ?? Trouble breathing.  ?? Swelling in your face, head, neck, or tongue.  Statins  Statins can help lower your risk for a heart attack and stroke. This medicine lowers your cholesterol. Examples include:  ?? Atorvastatin (Lipitor).  ?? Lovastatin (Mevacor).  ?? Pravastatin (Pravachol).  ?? Simvastatin (Zocor).  Before you start taking a statin, talk to your doctor. Make sure your doctor knows if:  ?? You have had a kidney transplant or other kidney problems.  ?? You have liver disease.  ?? You take any other prescription medicine, over-the-counter medicine, vitamins, supplements, or herbal remedies.  ?? You are pregnant or breastfeeding.  Statins can cause side effects. Call your doctor right away if you have:  ?? New, severe muscle aches.  ?? Brown urine.  Aspirin  After a heart attack,  aspirin can help lower your risk of having another one. Most heart attacks are caused by a blood clot that blocks a coronary artery. When this happens, oxygen can't get to the heart muscle, and part of the heart dies. Aspirin can help prevent blood clots that can block the blood vessels.  You may not be able to use aspirin if you:  ?? Have asthma or certain other health conditions.  ?? Have an ulcer or other stomach problem.  ?? Take some other medicine (called a blood thinner) that prevents blood clots.  ?? Are allergic to aspirin.  Your doctor may recommend that you take one low-dose aspirin (81 mg) tablet each day, with a meal and a full glass of water.  Aspirin can also cause serious bleeding. Be sure you get instructions about how to take aspirin safely.  Call your doctor right away if you have:  ?? Unusual bleeding.  ?? Nausea, vomiting, or  heartburn.  ?? Black or bloody stools.  Beta-blockers  Beta-blockers are used for three main reasons. They lower blood pressure. They relieve angina symptoms (such as chest pain or pressure). And they reduce the chances of a second heart attack. They include:  ?? Atenolol (Tenormin).  ?? Carvedilol (Coreg).  ?? Metoprolol (Lopressor).  Before you start taking a beta-blocker, make sure your doctor knows if you have:  ?? Severe asthma or frequent asthma attacks.  ?? A very slow pulse. (This is less than 55 beats a minute.)  Beta-blockers can cause side effects. Call your doctor right away if you:  ?? Wheeze or have trouble breathing.  ?? Feel dizzy or lightheaded.  ?? Have asthma that gets worse.  When should you call for help?  Watch closely for changes in your health, and be sure to contact your doctor if you have any problems.  Where can you learn more?  Go to [email or web address]  Enter R428 in the search box to learn more about "Reducing Risk of Another Heart Attack With Medicine: Care Instructions."  Current as of: January 19, 2019??????????????????????????????Content  Version: 12.8  ?? 2006-2021 [employer].   Care instructions adapted under license by [employer] (which disclaims liability or warranty for this information). If you have questions about a medical condition or this instruction, always ask your healthcare professional. [employer] disclaims any warranty or liability for your use of this information.         Heart Attack: Care Instructions  Overview     A heart attack is an event that occurs when part of the heart muscle does not get enough blood and oxygen. This part of the heart starts to die. A heart attack is also called a myocardial infarction, or MI.  A heart attack most often happens because blood flow through one or more of the coronary arteries is blocked. This blockage is usually caused by a blood clot that forms when plaque in the artery breaks open.  After a heart attack, you may be worried about your future. Over the next several weeks, your heart will start to heal. Though it can be hard to break old habits, you can reduce your risk of having another heart attack. You can do this by making some lifestyle changes and by taking medicines.  Follow-up care is a key part of your treatment and safety. Be sure to make and go to all appointments, and call your doctor if you are having problems. It's also a good idea to know your test results and keep a list of the medicines you take.  How can you care for yourself at home?  Activity  ?? ?? Until your doctor says it is okay, do not do strenuous exercise. And do not lift, pull, or push anything heavy. Ask your doctor what types of activities are safe for you.   ?? ?? If your doctor has not set you up with a cardiac rehabilitation (rehab) program, talk to him or her about whether that is right for you. Cardiac rehab includes supervised exercise. It also includes help with diet and lifestyle changes and emotional support. It may reduce your risk of future heart problems.   ?? ??  Increase your activities slowly. Take short rest breaks when you get tired.   ?? ?? If your doctor recommends it, get more exercise. Walking is a good choice. Bit by bit, increase the amount you walk every day. Try for at least 30 minutes on most  days of the week. You also may want to swim, bike, or do other activities. Talk with your doctor before you start an exercise program to make sure it is safe for you.   ?? ?? Ask your doctor when you can drive, go back to work, and do other daily activities again.   ?? ?? You can have sex as soon as you feel ready for it. Often this means when you can easily walk around or climb stairs. Talk with your doctor if you have any concerns. If you are taking nitroglycerin, do not take erection-enhancing medicine such as sildenafil (Viagra), tadalafil (Cialis), or vardenafil (Levitra) .   Lifestyle changes  ?? ?? Do not smoke. Smoking increases your risk of another heart attack. If you need help quitting, talk to your doctor about stop-smoking programs and medicines. These can increase your chances of quitting for good.   ?? ?? Eat a heart-healthy diet that is low in saturated fat and salt, and is full of fruits, vegetables and whole-grains. You may get more details about how to eat healthy. But these tips can help you get started.   ?? ?? Stay at a healthy weight, or lose weight if you need to.   Medicines  ?? ?? Be safe with medicines. Take your medicines exactly as prescribed. Call your doctor if you think you are having a problem with your medicine. You will get more details on the specific medicines your doctor prescribes. Do not stop taking your medicine unless your doctor tells you to. Not taking your medicine might raise your risk of having another heart attack.   ?? ?? You may need several medicines to help lower your risk of another heart attack. These include:  ? Blood pressure medicines such as angiotensin-converting enzyme (ACE) inhibitors, ARBs (angiotensin II receptor blockers), and  beta-blockers.  ? Cholesterol medicine called statins.  ? Aspirin and other blood thinners. These prevent blood clots that can cause a heart attack.   ?? ?? If your doctor has given you nitroglycerin, keep it with you at all times. If you have angina symptoms, such as chest pain or pressure, sit down and rest. Take the first dose of nitroglycerin as directed. If symptoms get worse or are not getting better within 5 minutes, call 911 right away. Stay on the phone. The emergency operator will tell you what to do.   ?? ?? Do not take any over-the-counter medicines, vitamins, or herbal products without talking to your doctor first.   Staying healthy  ?? ?? Manage other health conditions such as high blood pressure and diabetes.   ?? ?? Avoid colds and flu. Get a pneumococcal vaccine shot. If you have had one before, ask your doctor whether you need another dose. Get the flu vaccine every year. If you must be around people with colds or flu, wash your hands often.   ?? ?? Be sure to tell your doctor about any angina symptoms you have had, even if they went away. Pay attention to your angina symptoms. Know what is typical for you and learn how to control it. Know when to call for help.   ?? ?? Talk to your family, friends, or a counselor about your feelings. It is normal to feel frightened, angry, hopeless, helpless, and even guilty. Talking openly about bad feelings can help you cope. If you have symptoms of depression, talk to your doctor.   When should you call for help?   Call 911 anytime you  think you may need emergency care. For example, call if:  ?? ?? You have symptoms of a heart attack. These may include:  ? Chest pain or pressure, or a strange feeling in the chest.  ? Sweating.  ? Shortness of breath.  ? Nausea or vomiting.  ? Pain, pressure, or a strange feeling in the back, neck, jaw, or upper belly or in one or both shoulders or arms.  ? Lightheadedness or sudden weakness.  ? A fast or irregular heartbeat.  After you call  911, the operator may tell you to chew 1 adult-strength or 2 to 4 low-dose aspirin. Wait for an ambulance. Do not try to drive yourself.   ?? ?? You have angina symptoms (such as chest pain or pressure) that do not go away with rest or are not getting better within 5 minutes after you take a dose of nitroglycerin.   ?? ?? You passed out (lost consciousness).   ?? ?? You feel like you are having another heart attack.   Call your doctor now or seek immediate medical care if:  ?? ?? You are having angina symptoms, such as chest pain or pressure, more often than usual, or the symptoms are different or worse than usual.   ?? ?? You have new or increased shortness of breath.   ?? ?? You are dizzy or lightheaded, or you feel like you may faint.   Watch closely for changes in your health, and be sure to contact your doctor if you have any problems.  Where can you learn more?  Go to [email or web address]  Enter H564 in the search box to learn more about "Heart Attack: Care Instructions."  Current as of: January 19, 2019??????????????????????????????Content Version: 12.8  ?? 2006-2021 [employer].   Care instructions adapted under license by [employer] (which disclaims liability or warranty for this information). If you have questions about a medical condition or this instruction, always ask your healthcare professional. [employer] disclaims any warranty or liability for your use of this information.         Noninsulin Medicines for Type 2 Diabetes: Care Instructions  Overview     There are different types of noninsulin medicines for diabetes. Each works in a different way. But they all help you control your blood sugar. Some types help your body make insulin to lower your blood sugar. Others lower how much insulin your body needs. Some can slow how fast your body digests sugars. And some can remove extra glucose through your urine.  You may need to take more than one medicine for diabetes.  Two or more medicines may work better to lower your blood sugar level than just one does.  ?? Metformin. This lowers how much glucose your liver makes. And it helps you respond better to insulin. It also lowers the amount of stored sugar that your liver releases when you are not eating.  ?? Sulfonylureas. These help your body release more insulin. Some work for many hours. They can cause low blood sugar if you don't eat as you planned. An example is glipizide.  ?? Thiazolidinediones. These reduce the amount of blood glucose. They also help you respond better to insulin. An example is pioglitazone.  ?? SGLT2 inhibitors. These help to remove extra glucose through your urine. They may also help some people lose weight. An example is ertugliflozin.  ?? DPP-4 inhibitors. These help your body raise the level of insulin after you eat. They also help your body  make less of a hormone that raises blood sugar. An example is alogliptin.  ?? Incretin hormones (GLP-1 receptor agonists). These help your body make a protein that can raise your insulin level and make you less hungry. They're given as shots or pills. An example is semaglutide.  ?? Meglitinides. These help your body release insulin. They also help slow how your body digests sugars. So they can keep your blood sugar from rising too fast after you eat.  ?? Alpha-glucosidase inhibitors. These keep starches from breaking down. This means that they lower the amount of glucose absorbed when you eat. They don't help your body make more insulin. So they will not cause low blood sugar unless you use them with other medicines for diabetes.  Follow-up care is a key part of your treatment and safety. Be sure to make and go to all appointments, and call your doctor if you are having problems. It's also a good idea to know your test results and keep a list of the medicines you take.  How can you care for yourself at home?  ?? Eat a healthy diet. Get some exercise each day. This may help you  to reduce how much medicine you need.  ?? Do not take other prescription or over-the-counter medicines, vitamins, herbal products, or supplements without talking to your doctor first. Some medicines for type 2 diabetes can cause problems with other medicines or supplements.  ?? Tell your doctor if you plan to get pregnant. Some of these drugs are not safe for pregnant women.  ?? Be safe with medicines. Take your medicines exactly as prescribed. Meglitinides and sulfonylureas can cause your blood sugar to drop very low. Call your doctor if you think you are having a problem with your medicine.  ?? Check your blood sugar often. You can use a glucose monitor. Keeping track can help you know how certain foods, activities, and medicines affect your blood sugar. And it can help you keep your blood sugar from getting so low that it's not safe.  When should you call for help?   Call 911 anytime you think you may need emergency care. For example, call if:  ?? ?? You passed out (lost consciousness).   ?? ?? You are confused or cannot think clearly.   ?? ?? Your blood sugar is very high or very low.   Watch closely for changes in your health, and be sure to contact your doctor if:  ?? ?? Your blood sugar stays outside the level your doctor set for you.   ?? ?? You have any problems.   Where can you learn more?  Go to [email or web address]  Enter H153 in the search box to learn more about "Noninsulin Medicines for Type 2 Diabetes: Care Instructions."  Current as of: January 19, 2019??????????????????????????????Content Version: 12.8  ?? 2006-2021 [employer].   Care instructions adapted under license by [employer] (which disclaims liability or warranty for this information). If you have questions about a medical condition or this instruction, always ask your healthcare professional. [employer] disclaims any warranty or liability for your use of this information.         Learning About Meal  Planning for Diabetes  Why plan your meals?     Meal planning can be a key part of managing diabetes. Planning meals and snacks with the right balance of carbohydrate, protein, and fat can help you keep your blood sugar at the target level you set with your doctor.  You don't have to eat special foods. You can eat what your family eats, including sweets once in a while. But you do have to pay attention to how often you eat and how much you eat of certain foods.  You may want to work with a dietitian or a certified diabetes educator. He or she can give you tips and meal ideas and can answer your questions about meal planning. This health professional can also help you reach a healthy weight if that is one of your goals.  What plan is right for you?  Your dietitian or diabetes educator may suggest that you start with the plate format or carbohydrate counting.  The plate format  The plate format is a simple way to help you manage how you eat. You plan meals by learning how much space each food should take on a plate. Using the plate format helps you spread carbohydrate throughout the day. It can make it easier to keep your blood sugar level within your target range. It also helps you see if you're eating healthy portion sizes.  To use the plate format, you put non-starchy vegetables on half your plate. Add meat or meat substitutes on one-quarter of the plate. Put a grain or starchy vegetable (such as brown rice or a potato) on the final quarter of the plate. You can add a small piece of fruit and some low-fat or fat-free milk or yogurt, depending on your carbohydrate goal for each meal.  Here are some tips for using the plate format:  ?? Make sure that you are not using an oversized plate. A 9-inch plate is best. Many restaurants use larger plates.  ?? Get used to using the plate format at home. Then you can use it when you eat out.  ?? Write down your questions about using the plate format. Talk to your doctor, a  dietitian, or a diabetes educator about your concerns.  Carbohydrate counting  With carbohydrate counting, you plan meals based on the amount of carbohydrate in each food. Carbohydrate raises blood sugar higher and more quickly than any other nutrient. It is found in desserts, breads and cereals, and fruit. It's also found in starchy vegetables such as potatoes and corn, grains such as rice and pasta, and milk and yogurt. Spreading carbohydrate throughout the day helps keep your blood sugar levels within your target range.  Your daily amount depends on several things, including your weight, how active you are, which diabetes medicines you take, and what your goals are for your blood sugar levels. A registered dietitian or diabetes educator can help you plan how much carbohydrate to include in each meal and snack.  A guideline for your daily amount of carbohydrate is:  ?? 45 to 60 grams at each meal. That's about the same as 3 to 4 carbohydrate servings.  ?? 15 to 20 grams at each snack. That's about the same as 1 carbohydrate serving.  The Nutrition Facts label on packaged foods tells you how much carbohydrate is in a serving of the food. First, look at the serving size on the food label. Is that the amount you eat in a serving? All of the nutrition information on a food label is based on that serving size. So if you eat more or less than that, you'll need to adjust the other numbers. Total carbohydrate is the next thing you need to look for on the label. If you count carbohydrate servings, one serving of carbohydrate is 15 grams.  For foods that don't come with labels, such as fresh fruits and vegetables, you'll need a guide that lists carbohydrate in these foods. Ask your doctor, dietitian, or diabetes educator about books or other nutrition guides you can use.  If you take insulin, you need to know how many grams of carbohydrate are in a meal. This lets you know how much rapid-acting insulin to take before you eat.  If you use an insulin pump, you get a constant rate of insulin during the day. So the pump must be programmed at meals to give you extra insulin to cover the rise in blood sugar after meals.  When you know how much carbohydrate you will eat, you can take the right amount of insulin. Or, if you always use the same amount of insulin, you need to make sure that you eat the same amount of carbohydrate at meals.  If you need more help to understand carbohydrate counting and food labels, ask your doctor, dietitian, or diabetes educator.  How can you plan healthy meals?  Here are some tips to get started:  ?? Plan your meals a week at a time. Don't forget to include snacks too.  ?? Use cookbooks or online recipes to plan several main meals. Plan some quick meals for busy nights. You also can double some recipes that freeze well. Then you can save half for other busy nights when you don't have time to cook.  ?? Make sure you have the ingredients you need for your recipes. If you're running low on basic items, put these items on your shopping list too.  ?? List foods that you use to make breakfasts, lunches, and snacks. List plenty of fruits and vegetables.  ?? Post this list on the refrigerator. Add to it as you think of more things you need.  ?? Take the list to the store to do your weekly shopping.  Follow-up care is a key part of your treatment and safety. Be sure to make and go to all appointments, and call your doctor if you are having problems. It's also a good idea to know your test results and keep a list of the medicines you take.  Where can you learn more?  Go to [email or web address]  Enter [phone] in the search box to learn more about "Learning About Meal Planning for Diabetes."  Current as of: January 19, 2019??????????????????????????????Content Version: 12.8  ?? 2006-2021 [employer]wise, Inc[employer].   Care instructions adapted under license by [employer] (which disclaims liability or warranty for this  information). If you have questions about a medical condition or this instruction, always ask your healthcare professional. Healthwise, Incorporated disclaims any warranty or liability for your use of this information.         Learning About Diabetes and Exercise  Can you exercise if you have diabetes?  When you have diabetes, it's important to get regular exercise. This helps control your blood sugar level. You can still play sports, run, ride a bike, swim, and do other activities when you have diabetes.  How does exercise help when you have diabetes?  Getting regular exercise can help control your blood sugar.  Your body turns the food you eat into glucose, a type of sugar. You need this sugar for energy. When you have diabetes, the sugar builds up in your blood. But when you exercise, your body uses sugar. This helps keep it from building up in your blood and results in lower blood sugar and better control of  diabetes.  Exercise may help you in other ways too. It can help you reach and stay at a healthy weight. It also helps improve blood pressure and cholesterol, which can reduce the risk of heart disease.  Exercise can make you feel stronger and happier. It can help you relax and sleep better. And it can give you confidence in other things you do.  Exercising safely when you have diabetes  Before you start a new exercise program, talk to your doctor about how and when to exercise. Some types of exercise can be harmful if your diabetes is causing other problems, such as problems with your feet. Your doctor can tell you what types of exercise are good choices for you.  Here are some general safety tips.  ?? Check your blood sugar before and after you exercise.   Be careful about what you eat, especially if you take insulin or other medicines for diabetes.  ?? Take steps to avoid blood sugar problems.  ? Ask your doctor what blood sugar range is safe for you when you exercise.  ? If you take medicine or insulin that  lowers blood sugar, check your blood sugar before you exercise.  ? If your blood sugar is less than 90 mg/dL, you may need to eat a carbohydrate snack first.  ? Be careful when you exercise if your blood sugar is too high.  ?? Try to exercise at about the same time each day.   This may help keep your blood sugar steady. If you want to exercise more, slowly increase how hard or long you exercise.  ?? Have someone with you when you exercise.   Or exercise at a gym. You may need help if your blood sugar drops too low.  ?? Keep some quick-sugar food with you.   You may get symptoms of low blood sugar during exercise or up to 24 hours later.  ?? Use proper footwear and the right equipment.  ?? Pay attention to your body.   If you are used to exercising and notice that you cannot do as much as usual, talk to your doctor.  Follow-up care is a key part of your treatment and safety. Be sure to make and go to all appointments, and call your doctor if you are having problems. It's also a good idea to know your test results and keep a list of the medicines you take.  Where can you learn more?  Go to [email or web address]  Enter C492 in the search box to learn more about "Learning About Diabetes and Exercise."  Current as of: January 19, 2019??????????????????????????????Content Version: 12.8  ?? 2006-2021 Healthwise, Incorporated.   Care instructions adapted under license by [employer] (which disclaims liability or warranty for this information). If you have questions about a medical condition or this instruction, always ask your healthcare professional. [employer], Inc[employer] disclaims any warranty or liability for your use of this information.         Learning About Diabetes and Your Teeth  How does diabetes affect your teeth and gums?     When you have diabetes, managing blood sugar levels and taking good care of your teeth and gums are both important. When blood sugar levels are high, there's a greater risk  for:  ?? Gum (periodontal) disease.  ?? Tooth decay.  ?? Fungal infections in the mouth, like thrush.  ?? Dry mouth, or xerostomia (say "zee-ruh-STO-mee-uh"). The mouth needs saliva to neutralize the acids in your mouth. These  acids can lead to gum disease and tooth decay.  Keeping your blood sugar levels in your target range can help prevent problems with the teeth and gums. If you have any problems with your teeth or gums, see your dentist.  How do you care for your teeth and gums when you have diabetes?  ?? Brush your teeth twice a day.  ?? Floss daily. Make sure to press the floss against your teeth and not your gums.  ?? Check each day for areas where your gums might be red or painful. Be sure to let your dentist know of any sores in your mouth.  ?? See your dentist regularly for professional cleaning of your teeth and to look for gum problems. Many dentists recommend getting checkups twice a year. Remind your dentist that you have diabetes before any work is done.  ?? Don't smoke or use smokeless tobacco. Tobacco use with diabetes can lead to a greater risk of severe gum disease. If you need help quitting, talk to your doctor about stop-smoking programs and medicines. These can increase your chances of quitting for good.  Follow-up care is a key part of your treatment and safety. Be sure to make and go to all appointments, and call your doctor if you are having problems. It's also a good idea to know your test results and keep a list of the medicines you take.  Where can you learn more?  Go to [email or web address]  Enter H523 in the search box to learn more about "Learning About Diabetes and Your Teeth."  Current as of: January 19, 2019??????????????????????????????Content Version: 12.8  ?? 2006-2021 [employer], [employer].   Care instructions adapted under license by [employer] (which disclaims liability or warranty for this information). If you have questions about a medical condition or this  instruction, always ask your healthcare professional. [employer] disclaims any warranty or liability for your use of this information.         Counting Carbohydrates: Care Instructions  Your Care Instructions     You don't have to eat special foods when you have diabetes. You just have to be careful to eat healthy foods. Carbohydrates (carbs) raise blood sugar higher and quicker than any other nutrient. Carbs are found in desserts, breads and cereals, and fruit. They're also in starchy vegetables. These include potatoes, corn, and grains such as rice and pasta. Carbs are also in milk and yogurt.  The more carbs you eat at one time, the higher your blood sugar will rise. Spreading carbs all through the day helps keep your blood sugar levels within your target range.  Counting carbs is one of the best ways to keep your blood sugar under control.  If you use insulin, counting carbs helps you match the right amount of insulin to the number of grams of carbs in a meal. Then you can change your diet and insulin dose as needed. Testing your blood sugar several times a day can help you learn how carbs affect your blood sugar.  A registered dietitian or certified diabetes educator can help you plan meals and snacks.  Follow-up care is a key part of your treatment and safety. Be sure to make and go to all appointments, and call your doctor if you are having problems. It's also a good idea to know your test results and keep a list of the medicines you take.  How can you care for yourself at home?  Know your daily amount of carbohydrates  Your  daily amount depends on several things, such as your weight, how active you are, which diabetes medicines you take, and what your goals are for your blood sugar levels. A registered dietitian or certified diabetes educator can help you plan how many carbs to include in each meal and snack.  For most adults, a guideline for the daily amount of carbs is:  ?? 45 to 60 grams at  each meal. That's about the same as 3 to 4 carbohydrate servings.  ?? 15 to 20 grams at each snack. That's about the same as 1 carbohydrate serving.  Count carbs  Counting carbs lets you know how much rapid-acting insulin to take before you eat. If you use an insulin pump, you get a constant rate of insulin during the day. So the pump must be programmed at meals. This gives you extra insulin to cover the rise in blood sugar after meals.  If you take insulin:  ?? Learn your own insulin-to-carb ratio. You and your diabetes health professional will figure out the ratio. You can do this by testing your blood sugar after meals. For example, you may need a certain amount of insulin for every 15 grams of carbs.  ?? Add up the carb grams in a meal. Then you can figure out how many units of insulin to take based on your insulin-to-carb ratio.  ?? Exercise lowers blood sugar. You can use less insulin than you would if you were not doing exercise. Keep in mind that timing matters. If you exercise within 1 hour after a meal, your body may need less insulin for that meal than it would if you exercised 3 hours after the meal. Test your blood sugar to find out how exercise affects your need for insulin.  If you do or don't take insulin:  ?? Look at labels on packaged foods. This can tell you how many carbs are in a serving. You can also use guides from the American Diabetes Association.  ?? Be aware of portions, or serving sizes. If a package has two servings and you eat the whole package, you need to double the number of grams of carbohydrate listed for one serving.  ?? Protein, fat, and fiber do not raise blood sugar as much as carbs do. If you eat a lot of these nutrients in a meal, your blood sugar will rise more slowly than it would otherwise.  Eat from all food groups  ?? Eat at least three meals a day.  ?? Plan meals to include food from all the food groups. The food groups include grains, fruits, dairy, proteins, and vegetables.  ??  Talk to your dietitian or diabetes educator about ways to add limited amounts of sweets into your meal plan.  ?? If you drink alcohol, talk to your doctor. It may not be recommended when you are taking certain diabetes medicines.  Where can you learn more?  Go to [email or web address]  Enter G703 in the search box to learn more about "Counting Carbohydrates: Care Instructions."  Current as of: January 19, 2019??????????????????????????????Content Version: 12.8  ?? 2006-2021 Healthwise, Inc[employer].   Care instructions adapted under license by [employer] (which disclaims liability or warranty for this information). If you have questions about a medical condition or this instruction, always ask your healthcare professional. Healthwise, Inc[employer] disclaims any warranty or liability for your use of this information.      Nutrition Tips for Diabetes: After Your Visit  Your Care Instructions  A healthy diet  is important to manage diabetes. It helps you lose weight (if you need to) and keep it off. It gives you the nutrition and energy your body needs and helps prevent heart disease. But a diet for diabetes does not mean that you have to eat special foods. You can eat what your family eats, including occasional sweets and other favorites. But you do have to pay attention to how often you eat and how much you eat of certain foods. The right plan for you will give you meals that help you keep your blood sugar at healthy levels.  Try to eat a variety of foods and to spread carbohydrate throughout the day. Carbohydrate raises blood sugar higher and more quickly than any other nutrient does. Carbohydrate is found in sugar, breads and cereals, fruit, starchy vegetables such as potatoes and corn, and milk and yogurt.  You may want to work with a dietitian or diabetes educator to help you plan meals and snacks. A dietitian or diabetes educator also can help you lose weight if that is one of your goals. The  following tips can help you enjoy your meals and stay healthy.  Follow-up care is a key part of your treatment and safety. Be sure to make and go to all appointments, and call your doctor if you are having problems. It???s also a good idea to know your test results and keep a list of the medicines you take.  How can you care for yourself at home?  ?? Learn which foods have carbohydrate and how much carbohydrate to eat. A dietitian or diabetes educator can help you learn to keep track of how much carbohydrate you eat.  ?? Spread carbohydrate throughout the day. Eat some carbohydrate at all meals, but do not eat too much at any one time.  ?? Plan meals to include food from all the food groups. These are the food groups and some example portion sizes:  ?? Grains: 1 slice of bread (1 ounce), ?? cup of cooked cereal, and 1/3 cup of cooked pasta or rice. These have about 15 grams of carbohydrate in a serving. Choose whole grains such as whole wheat bread or crackers, oatmeal, and brown rice more often than refined grains.  ?? Fruit: 1 small fresh fruit, such as an apple or orange; ?? of a banana; ?? cup of chopped, cooked, or canned fruit; ?? cup of fruit juice; 1 cup of melon or raspberries; and 2 tablespoons of dried fruit. These have about 15 grams of carbohydrate in a serving.  ?? Dairy: 1 cup of nonfat or low-fat milk and 2/3 cup of plain yogurt. These have about 15 grams of carbohydrate in a serving.  ?? Protein foods: Beef, chicken, [place], fish, eggs, tofu, cheese, cottage cheese, and peanut butter. A serving size of meat is 3 ounces, which is about the size of a deck of cards. Examples of meat substitute serving sizes (equal to 1 ounce of meat) are 1/4 cup of cottage cheese, 1 egg, 1 tablespoon of peanut butter, and ?? cup of tofu. These have very little or no carbohydrate per serving.  ?? Vegetables: Starchy vegetables such as ?? cup of cooked dried beans, peas, potatoes, or corn have about 15 grams of carbohydrate. Nonstarchy  vegetables have very little carbohydrate, such as 1 cup of raw leafy vegetables (such as spinach), ?? cup of other vegetables (cooked or chopped), and 3/4 cup of vegetable juice.  ?? Use the plate format to plan meals. It is  a good, quick way to make sure that you have a balanced meal. It also helps you spread carbohydrate throughout the day. You divide your plate by types of foods. Put vegetables on half the plate, meat or meat substitutes on one-quarter of the plate, and a grain or starchy vegetable (such as brown rice or a potato) in the final quarter of the plate. To this you can add a small piece of fruit and 1 cup of milk or yogurt, depending on how much carbohydrate you are supposed to eat at a meal.  ?? Talk to your dietitian or diabetes educator about ways to add limited amounts of sweets into your meal plan. You can eat these foods now and then, as long as you include the amount of carbohydrate they have in your daily carbohydrate allowance.  ?? If you drink alcohol, limit it to no more than 1 drink a day for women and 2 drinks a day for men. If you are pregnant, no amount of alcohol is known to be safe.  ?? Protein, fat, and fiber do not raise blood sugar as much as carbohydrate does. If you eat a lot of these nutrients in a meal, your blood sugar will rise more slowly than it would otherwise.  ?? Limit saturated fats, such as those from meat and dairy products. Try to replace it with monounsaturated fat, such as olive oil. This is a healthier choice because people who have diabetes are at higher-than-average risk of heart disease. But use a modest amount of olive oil. A tablespoon of olive oil has 14 grams of fat and 120 calories.  ?? Exercise lowers blood sugar. If you take insulin by shots or pump, you can use less than you would if you were not exercising. Keep in mind that timing matters. If you exercise within 1 hour after a meal, your body may need less insulin for that meal than it would if you exercised  3 hours after the meal. Test your blood sugar to find out how exercise affects your need for insulin.  ?? Exercise on most days of the week. Aim for at least 30 minutes. Exercise helps you stay at a healthy weight and helps your body use insulin. Walking is an easy way to get exercise. Gradually increase the amount you walk every day. You also may want to swim, bike, or do other activities.  When you eat out  ?? Learn to estimate the serving sizes of foods that have carbohydrate. If you measure food at home, it will be easier to estimate the amount in a serving of restaurant food.  ?? If the meal you order has too much carbohydrate (such as potatoes, corn, or baked beans), ask to have a low-carbohydrate food instead. Ask for a salad or green vegetables.  ?? If you use insulin, check your blood sugar before and after eating out to help you plan how much to eat in the future.  ?? If you eat more carbohydrate at a meal than you had planned, take a walk or do other exercise. This will help lower your blood sugar.   Where can you learn more?   Go to [email or web address]  Enter [phone] in the search box to learn more about "Nutrition Tips for Diabetes: After Your Visit."   ?? 2006-2014 Healthwise, Incorporated. Care instructions adapted under license by [employer] (which disclaims liability or warranty for this information). This care instruction is for use with your licensed healthcare professional. If you have  questions about a medical condition or this instruction, always ask your healthcare professional. Health[employer], Incorporated disclaims any warranty or liability for your use of this information.  Content Version: 10.2.346038; Current as of: October 22, 2012                 Learning About Statins for People With Diabetes  Introduction  Statins are medicines that help with your cholesterol. Cholesterol is a type of fat in your blood. If you have too much of this fat, it can build up in blood vessels. This raises  your risk of heart disease, heart attack, and stroke.  Many people with diabetes take statins. Diabetes can cause problems in your body that may also lead to heart disease. That means your risks of heart attack and stroke are higher when you have diabetes. Statins can lower your risk.  Your doctor may prescribe a statin if:  ?? You have diabetes.  ?? AND you are age 52 to 38.  Statins may help people with diabetes at other ages too. Your doctor can help you decide if statins may help you.  What are some examples of statins?  Here are some examples of statins. For each item in the list, the generic name is first, followed by any brand names.  ?? atorvastatin (Lipitor)  ?? lovastatin (Altoprev, Mevacor)  ?? pravastatin (Pravachol)  ?? rosuvastatin (Crestor)  ?? simvastatin (Zocor)  This is not a complete list of statins.  Possible side effects  Some people who take statins report that they have more muscle aches. But it's not clear whether these are actually a side effect of statins. Most side effects will go away if you stop taking the medicine. You may have other side effects not described here. Check the information that comes with your medicine.  What to know about taking this medicine  ?? You must take statins on a regular basis for them to work well. If you stop, your risk for heart attack and stroke may go back up.  ?? Be safe with medicines. Take your medicines exactly as prescribed. Call your doctor if you think you are having a problem with your medicine.  ?? If you have side effects that bother you, talk to your doctor. You may be able to take a different statin.  ?? Check with your doctor or pharmacist before you use any other medicines. This includes over-the-counter medicines. Make sure your doctor knows all of the medicines, vitamins, herbal products, and supplements you take. Taking some medicines together can cause problems.  Where can you learn more?  Go to [email or web address]  Enter  [phone] in the search box to learn more about "Learning About Statins for People With Diabetes."  Current as of: January 19, 2019??????????????????????????????Content Version: 12.8  ?? 2006-2021 Healthwise, Inc[employer].   Care instructions adapted under license by [employer] (which disclaims liability or warranty for this information). If you have questions about a medical condition or this instruction, always ask your healthcare professional. Health[employer], Inc[employer] disclaims any warranty or liability for your use of this information.         Learning About the Mediterranean Diet  What is the Mediterranean diet?     The Mediterranean diet is a style of eating rather than a diet plan. It features foods eaten in [place], [place], southern [place], and other countries along the [employer]. It emphasizes eating foods like fish, fruits, vegetables, beans, high-fiber breads and whole grains, nuts, and olive oil.  This style of eating includes limited red meat, cheese, and sweets.  Why choose the Mediterranean diet?  A Mediterranean-style diet may improve heart health. It contains more fat than other heart-healthy diets. But the fats are mainly from nuts, unsaturated oils (such as fish oils and olive oil), and certain nut or seed oils (such as canola, soybean, or flaxseed oil). These fats may help protect the heart and blood vessels.  How can you get started on the Mediterranean diet?  Here are some things you can do to switch to a more Mediterranean way of eating.  What to eat  ?? Eat a variety of fruits and vegetables each day, such as grapes, blueberries, tomatoes, broccoli, peppers, figs, olives, spinach, eggplant, beans, lentils, and chickpeas.  ?? Eat a variety of whole-grain foods each day, such as oats, brown rice, and whole wheat bread, pasta, and couscous.  ?? Eat fish at least 2 times a week. Try tuna, salmon, mackerel, lake trout, herring, or sardines.  ?? Eat moderate amounts of low-fat dairy products, such  as milk, cheese, or yogurt.  ?? Eat moderate amounts of poultry and eggs.  ?? Choose healthy (unsaturated) fats, such as nuts, olive oil, and certain nut or seed oils like canola, soybean, and flaxseed.  ?? Limit unhealthy (saturated) fats, such as butter, palm oil, and coconut oil. And limit fats found in animal products, such as meat and dairy products made with whole milk. Try to eat red meat only a few times a month in very small amounts.  ?? Limit sweets and desserts to only a few times a week. This includes sugar-sweetened drinks like soda.  The Mediterranean diet may also include red wine with your meal???1 glass each day for women and up to 2 glasses a day for men.  Tips for eating at home  ?? Use herbs, spices, garlic, lemon zest, and citrus juice instead of salt to add flavor to foods.  ?? Add avocado slices to your sandwich instead of bacon.  ?? Have fish for lunch or dinner instead of red meat. Brush the fish with olive oil, and broil or grill it.  ?? Sprinkle your salad with seeds or nuts instead of cheese.  ?? Cook with olive or canola oil instead of butter or oils that are high in saturated fat.  ?? Switch from 2% milk or whole milk to 1% or fat-free milk.  ?? Dip raw vegetables in a vinaigrette dressing or hummus instead of dips made from mayonnaise or sour cream.  ?? Have a piece of fruit for dessert instead of a piece of cake. Try baked apples, or have some dried fruit.  Tips for eating out  ?? Try broiled, grilled, baked, or poached fish instead of having it fried or breaded.  ?? Ask your server to have your meals prepared with olive oil instead of butter.  ?? Order dishes made with marinara sauce or sauces made from olive oil. Avoid sauces made from cream or mayonnaise.  ?? Choose whole-grain breads, whole wheat pasta and pizza crust, brown rice, beans, and lentils.  ?? Cut back on butter or margarine on bread. Instead, you can dip your bread in a small amount of olive oil.  ?? Ask for a side salad or grilled  vegetables instead of french fries or chips.  Where can you learn more?  Go to ClassMovie.be  Enter O407 in the search box to learn more about "Learning About the Mediterranean Diet."  Current as of: May 07, 2019??????????????????????????????Content Version: 12.8  ?? 2006-2021 [employer].   Care instructions adapted under license by [employer] (which disclaims liability or warranty for this information). If you have questions about a medical condition or this instruction, always ask your healthcare professional. [employer], Inc[employer] disclaims any warranty or liability for your use of this information.         Resistance Training With Free Weights: Exercises  Introduction  Here are some examples of exercises for resistance training. Start each exercise slowly. Ease off the exercise if you start to have pain.  Your doctor or physical therapist will tell you when you can start these exercises and which ones will work best for you.  How to do the exercises  Chest fly   1. Lie on a bench or exercise ball, and hold the weights straight up over your chest. Do not lock your elbows. You can keep them slightly bent if that is comfortable for you.  2. Slowly lower your arms, keeping them extended, until the weights are level with your chest, or slightly lower.  3. Slowly raise your arms until you are in the starting position.  4. Repeat 8 to 12 times.  5. Rest for a minute, and repeat the exercise.    Lateral raise for the outer part of the shoulder (deltoid)   1. Stand with your feet shoulder-width apart and your knees slightly bent.  2. Bend your arms 90 degrees with your elbows at hip level. With your palms facing in, hold the weights straight in front of you.  3. Slowly lift the weights and your elbows out to the sides to shoulder level, keeping your elbows bent. Keep your shoulders down and relaxed as you lift. If you find you are shrugging your shoulders up toward your  ears, your weights may be too heavy.  4. Slowly lower the weights back to your sides.  5. Repeat 8 to 12 times.  6. Rest for a minute, and repeat the exercise.    Biceps curls   1. Sit leaning forward with your legs slightly spread and your left hand on your left thigh.  2. Hold the weight in your right hand, and place your right elbow on your right thigh.  3. Slowly curl the weight up and toward your chest.  4. Slowly lower the weight to the original position.  5. Repeat 8 to 12 times.  6. Rest for a minute, and repeat the exercise.  7. Do the same exercise with your other arm.    Follow-up care is a key part of your treatment and safety. Be sure to make and go to all appointments, and call your doctor if you are having problems. It's also a good idea to know your test results and keep a list of the medicines you take.  Where can you learn more?  Go to [email or web address][email or web address]  Enter U404 in the search box to learn more about "Resistance Training With Free Weights: Exercises."  Current as of: January 29, 2019??????????????????????????????Content Version: 12.8  ?? 2006-2021 [employer].   Care instructions adapted under license by [employer] (which disclaims liability or warranty for this information). If you have questions about a medical condition or this instruction, always ask your healthcare professional. [employer], [employer] disclaims any warranty or liability for your use of this information.

## 2019-09-11 NOTE — Telephone Encounter (Signed)
Patient called access team to schedule a np appointment at [facility] office. This should have been an internal transfer request. Pt last seen 08/25/2018 with [facility] provider. Pt scheduled at [facility] on 09/11/2019 for np appointment. Sending to [facility] to advise of transfer request due to service recovery not being able to be done prior to establish at [facility] office. No barriers noted in patients chart to prevent transfer of care request and [facility] can accept patient.     Dustin Carney

## 2019-09-11 NOTE — Telephone Encounter (Addendum)
Patient is scheduled with [facility] provider TODAY at 10:30am.  I attempted to call patient just now to do service recovery; there was no answer, I left a voicemail message to call me back.    Patient was with Dr. Nash Dimmer in 2018, then transferred to Dr. Dorene Grebe when he left the practice.  Patient has never been seen by Dr. Dorene Grebe.  He had one Virtual  hospital follow up appointment with Clent Ridges on 08/25/18 and did not complete any follow up.  Patient has been in the ED multiple times.      If patient wants to transfer to [facility] it is OK with our office.

## 2019-09-11 NOTE — Telephone Encounter (Signed)
Urgent referral for chronic pain.  Spoke to patient who stated pain is dental and due to kidney stones and torn meniscus in both knees.  Stated he was referred here because the doctor told him he cannot prescribe dilaudid for the pain, that he would need to get that from a pain clinic.  I advised patient that our office does not prescribe opioids and we do not treat organ or dental pain.  Patient stated he does not know why he was referred to [place] then.  Patient was very pleasant and thanked me for my time and call but declined an appt.    Msg sent to Dr. Laney Potash advising of our opioid policy and above call with patient.  Referral closed.

## 2019-09-12 NOTE — Telephone Encounter (Signed)
Please request records from [facility]
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
## 2019-09-15 NOTE — Telephone Encounter (Signed)
Noted, mailing ROI    [facility] [place]

## 2019-09-16 NOTE — Telephone Encounter (Signed)
Pt name and DOB verified. Pt is calling states that he has been having pain in his teeth states that he has a referral in to oral surgery and states that he has not heard from them and is in pain. States that he would like a call back.   CB# [phone] Darcie Farrel Demark

## 2019-09-16 NOTE — Telephone Encounter (Signed)
Pt states has not heard about referral for teeth. Pt states he is in excruciating pain and needs more meds. Pt would like a return call  M. Zenda Alpers, RN

## 2019-09-17 NOTE — Telephone Encounter (Signed)
Pt aware I am only prescribing 5 days and he stated it would last 2 weeks.  Pt was told to go to emergency visits at dentist  An example is:  [facility] [place]  -[phone]  To address pain, urgent care or ER.   He was supposed to have follow up appointment.

## 2019-09-17 NOTE — Telephone Encounter (Signed)
Noted  M. Patterson Hollenbaugh, RN

## 2019-09-18 NOTE — Telephone Encounter (Signed)
Checked into the referral made.  Pt states he doesn't have insurance. [facility] only take patients less than 18 or new [facility] patients.  Herby Abraham, RN

## 2019-09-18 NOTE — Telephone Encounter (Signed)
Encouraged pt to make an appt    Discussed with him that opiates is not a long term plan    I'll discuss referral for suboxone therapy

## 2019-09-18 NOTE — Telephone Encounter (Signed)
Spoke with pt and strongly advised to comply with what is being asked.  He was asked on more than one occasion to make a follow up appt with provider  Future Appointments   Date Time Provider Department Center   09/21/2019 10:30 AM Heide Guile, MD [facility]     Herby Abraham, RN

## 2019-09-18 NOTE — Telephone Encounter (Signed)
Noted. Thank you

## 2019-09-18 NOTE — Telephone Encounter (Signed)
Pt calling back stating he hadn't heard from the office.   Read him provider note.    He states that he went to [facility] and sat there for 3 days straight and "they never saw me".    He states he has called around and everyone has a long waiting list.  Attempted to make follow up appt and states "lets get this figured out first, then I will"  Herby Abraham, RN

## 2019-09-19 ENCOUNTER — Emergency Department
Admit: 2019-09-19 | Disposition: A | Discharge status: Home / Self Care | Source: Ambulatory Visit | Attending: Emergency Medicine | Admitting: Emergency Medicine

## 2019-09-19 ENCOUNTER — Ambulatory Visit: Admitting: Emergency Medicine

## 2019-09-19 ENCOUNTER — Emergency Department
Admit: 2019-09-19 | Discharge: 2019-09-19 | Disposition: A | Discharge status: Home / Self Care | Payer: No Typology Code available for payment source

## 2019-09-19 NOTE — ED Provider Notes (Signed)
 [name]  [name], [name]  MRN: [id number]  Age: 44 yrs  Sex: Male  DOB: 1975/08/11  Arrival Date: 09/19/2019  Arrival Time: 17:35  Account#: [id number]  .  Working Diagnosis: Chest pain, unspecified  PCP:  .  HPI:  05/01  18:11 The patient arrives via EMS. He reports that 1 hour of central  jms        chest pain rating to left arm. He notes no palpitations or        diaphoresis. Notes a bit of shortness of breath. 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 He reports the symptoms are similar to a        past MI..  .  Historical:  - Allergies: Codeine; NTG (drops his BP); Motrin; Toradol; Coconut; Tramado    HCl; Ibuprofen; Morphine;  - Home Meds: aspirin 81 mg Oral chew 1 tab once daily; Lipitor Oral;    Metoprolol Tartrate Oral;  - PMHx: mi x3; CAD; NIDDM; High Cholesterol;  - PSHx: Cardiac Bypass Surgery x2; gallbladder surgery;  - Social history: Smoking status: The patient is not a current    smoker. Patient/guardian denies using street drugs.  - The history from nurses notes was reviewed: and I agree with    what is documented.  .  .  ROS:  18:12 Constitutional: Negative for chills, fever.                     jms  18:12 Eyes: Negative for redness.  18:12 ENT: Negative for sore throat.  18:12 Cardiovascular: Positive for chest pain, Negative for        palpitations.  18:12 Respiratory: Negative for cough, shortness of breath.  18:12 Abdomen/GI: Negative for abdominal pain, nausea, vomiting,        diarrhea.  18:12 MS/extremity: Negative for pain.  18:12 Neuro: Negative for headache, weakness.  18:12 Psych: Negative for alcohol dependence.  .  Vital Signs:  17:37 BP 146 / 61; Pulse 73; Resp 16; Temp 35.7(O); Pulse Ox 97% on   mt18        R/A; Pain 10/10;  18:16                                                                 lk14  18:16 Pt refusing all vitals                                          lk14  .  Glasgow Coma Score:  17:36 Eye Response: spontaneous(4). Verbal Response: oriented(5).      mt18  .  [name], [name]  [id number]  [id number]  Page 1 of 5  %%PAGE  .  [name], [name]  MRN: [id number]  Age: 23 yrs  Sex: Male  DOB: 1976-01-01  Arrival Date: 09/19/2019  Arrival Time: 17:35  Account#: [id number]  .  Working Diagnosis: Chest pain, unspecified  PCP:  .        Motor Response: obeys commands(6). Total: 15.  .  Exam:  18:12 Constitutional: The patient appears well nourished, alert, well jms        developed.  18:12 Head/face: Exam is negative for obvious evidence of injury or        deformity.  18:12 Eyes: Conjunctiva: normal.  18:12 ENT: Voice: is normal.  18:12 Respiratory:  Respirations: normal.  18:12 Cardiovascular: Rate: actual rate is  73 bpm.  18:12 Psych: Behavior/mood is cooperative.  18:12 Neuro: Orientation: to person, place / time. Mentation: lucid,        able to follow commands, Cranial nerves: Grossly intact Motor:        is normal.  .  MDM:  18:30 Differential diagnosis: acute myocardial infarction, anxiety,   jms        coronary artery disease chest wall pain, Drug-seeking,        malingering. Data reviewed: EKG, nurses notes, old medical        records, I interpreted the patient's EKG. It shows sinus rhythm        rate 75 bpm. The axis is normal. Intervals are normal. No        ectopy or ischemic changes are seen.. Counseling: I had a        detailed discussion with the patient and/or guardian regarding:        the historical points, exam findings, and any diagnostic        results supporting the discharge diagnosis, need for followup,        to return to the emergency department if symptoms worsen or        persist or if there are any questions or concerns that arise at        home.  [name]  05/01  17:36 Order name: EDIE_CAREPLAN; Complete Time: 17:42                 dispa  t  05/01  17:36 Order name: PATIENTPING STORY; Complete Time: 17:42             dispa  t  05/01  17:38 Order name: BUN (Blood Urea Nitrogen)                           mt18  05/01  17:38 Order  name: CBC/Diff (With Plt)                                 mt18  05/01  17:38 Order name: CR (Creatinine)                                     mt18  05/01  17:38 Order name: GLU (Glucose)                                       mt18  .  [name], [name]  [id number]  [id number]  Page 2 of 5  %%PAGE  .  [name], [name]  MRN: [id number]  Age: 60 yrs  Sex: Male  DOB: [birth year]  Arrival Date: 09/19/2019  Arrival Time: 17:35  Account#: [id number]  .  Working Diagnosis: Chest pain, unspecified  PCP:  .  05/01  17:38 Order name: LYTES (Na, K, Cl, Co2)  mt18  05/01  17:38 Order name: PT (Prothrombin Time With INR)                      mt18  05/01  17:38 Order name: PTT                                                 mt18  05/01  17:38 Order name: Troponin I                                          mt18  05/01  17:38 Order name: Adult EKG (order using folder); Complete Time: 17:12mt18  05/01  17:46 Order name: ADD Tox Screen - Serum; Complete Time: 17:55        jms  05/01  17:46 Order name: UA w/Refl Cult                                      jms  05/01  17:46 Order name: Tox Screen (Urine)                                  jms  05/01  17:38 Order name: Cardiac Monitor; Complete Time: 17:52               mt18  05/01  17:38 Order name: EKG (order using folder); Complete Time: 17:52      mt18  05/01  17:38 Order name: Pulse Oximetry Continuous; Complete Time: 17:52     mt18  .  Attending Notes:  18:13 ED Course: The patient was stable in the emergency department.  jms        He looked well, his vital signs were stable .I did a rather        extensive review of this patient's database including review of        our medical records, the EDIE secure data registry, and the        [employer]PAT. the patient reports he has not had an episode of chest        pain for about the past 2 years. This is at variance with the        medical record which shows the patient was actually admitted 3        months  ago to [facility] where he had a normal        echocardiogram. The patient initially said that he had no PCP.        He reports he has no cardiologist. Our cardiology department        notes that he was seen here in 1/19 and they had concerns about        his chronic chest pain that they felt was noncardiac. The        MassPAT shows that the patient has had 29 prescriptions from        controlled substances from 28 different providers filled to 20        different pharmacies in the past 12 months .Based on the  patient's Apparent obfuscation of his medical history and        incongruence is in his records, I come to the diagnoses below.        at this time the patient requested discharge and declined  .  [name], [name]  [id number]  [id number]  Page 3 of 5  %%PAGE  .  [name], [name]  MRN: [id number]  Age: 30 yrs  Sex: Male  DOB: 07/26/1975  Arrival Date: 09/19/2019  Arrival Time: 17:35  Account#: [id number]  .  Working Diagnosis: Chest pain, unspecified  PCP:  .        further lab tests or evaluation. My Working Impression: Chest        pain chronic, drug-seeking behavior. Attending chart complete        and electronically signed: J.M.Jeannett Senior MD [id number].  [name]  Disposition Summary:  09/19/19 18:07  Discharge Ordered        Location: Home -                                                jms        Problem: new                                                    jms        Symptoms: have improved                                         jms        Condition: Stable                                               jms        Diagnosis          - Chest pain, unspecified                                     jms        Followup:                                                       jms          - With: Private Physician          - When: As needed          - Reason:        Discharge Instructions:          - Discharge Summary Sheet                                     jms          -  CHEST PAIN,  NonCardiac                                      jms        Forms:          - Medication Reconciliation Form                              jms          - Fax Summary                                                 jms  Signatures:  Barbette Merino                          MD   jms  Dispatcher, [employer]                          dispa  Octavia Heir                         RN   mt18  Mirian Capuchin                              MD   lr18  [name], [name]  .  Corrections: (The following items were deleted from the chart)  18:31 18:13 ED Course: The patient was stable in the emergency        jms        department. He looked well, his vital signs were stable .I did        a rather extensive review of this patient's database including        review of our medical records, the EDIE secure data registry,        and the [employer]PAT. the patient reports he has not had an episode        of chest pain for about the past 2 years. This is at variance        with the medical record which shows the patient was actually        admitted 3 months ago to [facility] where he had a        normal echocardiogram. The patient initially said that he had        no PCP. He reports he has no cardiologist. Our cardiology        department notes that he was seen here in 1/19 and they had  .  [name], [name]  [id number]  [id number]  Page 4 of 5  %%PAGE  .  [name], [name]  MRN: [id number]  Age: 84 yrs  Sex: Male  DOB: 03-03-1976  Arrival Date: 09/19/2019  Arrival Time: 17:35  Account#: [id number]  .  Working Diagnosis: Chest pain, unspecified  PCP:  .        concerns about his chronic  chest pain that they felt was        noncardiac. The MassPAT shows that the patient has had 29        prescriptions from controlled substances from 28 different        providers filled to 20 different pharmacies in the past 12        months .Based on the patient's Apparent obfuscation of his        medical history and  incongruence is in his records, I come to        the diagnoses below. jms  .  Document is preliminary until electronically or manually signed by the atte  nding physician  .  .  .  .  .  .  .  .  .  .  .  .  .  .  .  .  .  .  .  .  .  .  .  .  .  .  .  .  .  .  .  .  .  .  [name], [name]  [id number]  [id number]  Page 5 of 5  .  %%END

## 2019-09-19 NOTE — ED Provider Notes (Signed)
[name]  Name: Welborn, Keena  MRN: [id number]  Age: 44 yrs  Sex: Male  DOB: 02-Dec-1975  Arrival Date: 09/19/2019  Arrival Time: 17:35  Account#: [id number]  Bed A5  PCP:  Chief Complaint: Chest Pain  .  Presentation:  05/01  17:35 Presenting complaint: EMS states: left sided chest pain         mt18        radiating to left jaw and left arm. Reports near syncope.        Reports hx of MI and CABG. Care prior to arrival: See EMS        report. Medication(s) given: ASA 324mg  PO.  17:35 Acuity: Adult 2                                                 mt18  17:35 Method Of Arrival: EMS: [facility]                              mt18  .  Historical:  - Allergies:  17:36 Codeine;                                                        mt18  17:36 NTG (drops his BP);                                             mt18  17:36 Motrin;                                                         mt18  17:36 Toradol;                                                        mt18  17:36 Coconut;                                                        mt18  17:36 Tramadol HCl;                                                   mt18  17:36 Ibuprofen;  mt18  17:36 Morphine;                                                       mt18  - Home Meds:  17:36 aspirin 81 mg Oral chew 1 tab once daily [Active]; Lipitor Oral mt18        [Active]; Metoprolol Tartrate Oral [Active];  - PMHx:  17:36 mi x3; CAD; NIDDM; High Cholesterol;                            mt18  - PSHx:  17:36 Cardiac Bypass Surgery x2; gallbladder surgery;                 mt18  .  - Social history: Smoking status: The patient is not a current    smoker. Patient/guardian denies using street drugs.  - The history from nurses notes was reviewed: and I agree with    what is documented.  .  .  Screening:  17:36 SEPSIS SCREENING SIRS Criteria (> = 2) No. Fall Risk None       mt18        identified. Exposure Risk/Travel Screening: COVID  Symptomsquestion        None. Known COVID 19 exposurequestion No. DPH requests        Isolationquestion(COVID) No. Have you tested + for COVIDquestion No. COVID 19        Vaccinequestion No.  .  Vital Signs:  17:37 BP 146 / 61; Pulse 73; Resp 16; Temp 35.7(O); Pulse Ox 97% on   mt18  .  Name:Staub, Dian  [id number]  [id number]  Page 1 of 3  %%PAGE  .  Name: Marciano, Mundt  MRN: [id number]  Age: 70 yrs  Sex: Male  DOB: 01-Oct-1975  Arrival Date: 09/19/2019  Arrival Time: 17:35  Account#: [id number]  Bed A5  PCP:  Chief Complaint: Chest Pain  .        R/A; Pain 10/10;  18:16                                                                 lk14  18:16 Pt refusing all vitals                                          lk14  .  Glasgow Coma Score:  17:36 Eye Response: spontaneous(4). Verbal Response: oriented(5).     mt18        Motor Response: obeys commands(6). Total: 15.  .  Triage Assessment:  17:36 General: Appears in no apparent distress, Behavior is           mt18        appropriate for age, cooperative. Pain: Complains of pain in        chest Pain currently is 10/10. Respiratory: Airway is patent.        Skin: Skin is normal. Musculoskeletal: Circulation, motion, and  sensation intact.  .  Assessment:  18:06 Reassessment: pt refusing labs. MD at bedside.                  lk14  .  Observations:  17:35 Patient arrived in ED.                                          mt18  17:35 Triage Completed.                                               mt18  17:46 Patient Visited ByMirian Capuchin                                  [phone]  18:07 Patient Visited By: Helyn Numbers  .  Procedure:  17:57 EKG done. (by ED staff). Old EKG Obtained Reviewed By: Neldon Labella MD.  .  Interventions:  17:36 Patient placed in exam room on stretcher.                       mt18  17:41 Demo Sheet Scanned into Chart                                   dw  18:18 EMS Sheet Scanned into Chart                                     dw  18:41 ECG/EKG Scanned into Chart                                      dw  18:43 Outside Patient Records Scanned into Chart                      dw  .  Outcome:  18:07 Discharge ordered by MD.                                        jms  18:16 Discharged to home ambulatory. Condition: stable. Discharge     lk14        instructions given to patient, refused paperwork. Discharge        Assessment: Patient awake, alert and oriented x 3. No cognitive        and/or functional deficits noted. Patient verbalized        understanding of disposition instructions. Chart Status Nursing  .  Name:Boxley, Dalten  [id number]  [id number]  Page 2 of 3  %%PAGE  .  Name: Dennie, Moltz  MRN: [id number]  Age: 44 yrs  Sex: Male  DOB: 29-Aug-1975  Arrival Date:  09/19/2019  Arrival Time: 17:35  Account#: [id number]  Bed A5  PCP:  Chief Complaint: Chest Pain  .        note complete and electronically signed.  18:19 Patient left the ED.                                            lk14  .  Signatures:  Barbette Merino                          MD   [address], [place]                                  dw  Octavia Heir                         RN   Lauretta Grill                              MD   lr18  Adria Devon                             CCT  [id number]  Marijo Conception                          BSN  [phone]  .  .  .  .  .  .  .  .  .  .  .  .  .  .  .  .  .  .  .  .  .  .  .  .  .  .  .  .  .  .  .  .  .  Name:Katen, Jasyn  [id number]  [id number]  Page 3 of 3  .  %%END

## 2019-09-21 ENCOUNTER — Ambulatory Visit: Attending: Family Medicine | Primary: Family Medicine

## 2019-09-23 ENCOUNTER — Emergency Department (HOSPITAL_COMMUNITY): Payer: Medicaid - Out of State

## 2019-09-23 ENCOUNTER — Observation Stay (HOSPITAL_COMMUNITY)
Admission: EM | Admit: 2019-09-23 | Discharge: 2019-09-23 | Discharge status: Against Medical Advice | Payer: Medicaid - Out of State | Attending: Internal Medicine | Admitting: Internal Medicine

## 2019-09-23 ENCOUNTER — Other Ambulatory Visit: Payer: Self-pay

## 2019-09-23 DIAGNOSIS — I2510 Atherosclerotic heart disease of native coronary artery without angina pectoris: Secondary | ICD-10-CM | POA: Insufficient documentation

## 2019-09-23 DIAGNOSIS — Z951 Presence of aortocoronary bypass graft: Secondary | ICD-10-CM | POA: Insufficient documentation

## 2019-09-23 DIAGNOSIS — Z5329 Procedure and treatment not carried out because of patient's decision for other reasons: Secondary | ICD-10-CM | POA: Diagnosis not present

## 2019-09-23 DIAGNOSIS — I10 Essential (primary) hypertension: Secondary | ICD-10-CM | POA: Diagnosis not present

## 2019-09-23 DIAGNOSIS — Z20822 Contact with and (suspected) exposure to covid-19: Secondary | ICD-10-CM | POA: Insufficient documentation

## 2019-09-23 DIAGNOSIS — E119 Type 2 diabetes mellitus without complications: Secondary | ICD-10-CM | POA: Diagnosis not present

## 2019-09-23 DIAGNOSIS — N200 Calculus of kidney: Secondary | ICD-10-CM | POA: Diagnosis not present

## 2019-09-23 DIAGNOSIS — Z79899 Other long term (current) drug therapy: Secondary | ICD-10-CM | POA: Insufficient documentation

## 2019-09-23 DIAGNOSIS — F329 Major depressive disorder, single episode, unspecified: Secondary | ICD-10-CM | POA: Diagnosis not present

## 2019-09-23 DIAGNOSIS — F17210 Nicotine dependence, cigarettes, uncomplicated: Secondary | ICD-10-CM | POA: Insufficient documentation

## 2019-09-23 DIAGNOSIS — R079 Chest pain, unspecified: Secondary | ICD-10-CM | POA: Diagnosis present

## 2019-09-23 DIAGNOSIS — E785 Hyperlipidemia, unspecified: Secondary | ICD-10-CM | POA: Insufficient documentation

## 2019-09-23 DIAGNOSIS — R072 Precordial pain: Secondary | ICD-10-CM | POA: Diagnosis not present

## 2019-09-23 LAB — TROPONIN I (HIGH SENSITIVITY): Troponin I (High Sensitivity): 3 ng/L (ref <18)

## 2019-09-23 LAB — CBC
HCT: 44 % (ref 39.0–52.0)
Hemoglobin: 14 g/dL (ref 13.0–17.0)
MCH: 30.3 pg (ref 26.0–34.0)
MCHC: 31.8 g/dL (ref 30.0–36.0)
MCV: 95.2 fL (ref 80.0–100.0)
Platelets: 193 10*3/uL (ref 150–400)
RBC: 4.62 MIL/uL (ref 4.22–5.81)
RDW: 12.8 % (ref 11.5–15.5)
WBC: 5.7 10*3/uL (ref 4.0–10.5)
nRBC: 0 % (ref 0.0–0.2)

## 2019-09-23 LAB — BASIC METABOLIC PANEL
Anion gap: 11 (ref 5–15)
BUN: 10 mg/dL (ref 6–20)
CO2: 23 mmol/L (ref 22–32)
Calcium: 9.3 mg/dL (ref 8.9–10.3)
Chloride: 106 mmol/L (ref 98–111)
Creatinine, Ser: 0.84 mg/dL (ref 0.61–1.24)
GFR calc Af Amer: >60 mL/min (ref >60)
GFR calc non Af Amer: >60 mL/min (ref >60)
Glucose, Bld: 102 mg/dL — ABNORMAL HIGH (ref 70–99)
Potassium: 4.1 mmol/L (ref 3.5–5.1)
Sodium: 140 mmol/L (ref 135–145)

## 2019-09-23 LAB — RESPIRATORY PANEL BY RT PCR (FLU A&B, COVID)
Influenza A by PCR: NEGATIVE
Influenza B by PCR: NEGATIVE
SARS Coronavirus 2 by RT PCR: NEGATIVE

## 2019-09-23 MED ORDER — ASPIRIN 81 MG PO CHEW: 81.0000 mg | CHEWABLE_TABLET | Freq: Every day | ORAL | Status: DC

## 2019-09-23 MED ORDER — ATORVASTATIN CALCIUM 40 MG PO TABS: 40.0000 mg | ORAL_TABLET | Freq: Every day | ORAL | Status: DC

## 2019-09-23 MED ORDER — SODIUM CHLORIDE 0.9% FLUSH
3.0000 mL | Freq: Once | INTRAVENOUS | Status: AC
  Administered 2019-09-23: 3 mL via INTRAVENOUS

## 2019-09-23 MED ORDER — ONDANSETRON HCL 4 MG/2ML IJ SOLN
4.0000 mg | Freq: Once | INTRAMUSCULAR | Status: AC
  Administered 2019-09-23: 4 mg via INTRAVENOUS
  Filled 2019-09-23: qty 2

## 2019-09-23 MED ORDER — INSULIN ASPART 100 UNIT/ML ~~LOC~~ SOLN: 0.0000 [IU] | Freq: Three times a day (TID) | SUBCUTANEOUS | Status: DC

## 2019-09-23 MED ORDER — SENNOSIDES-DOCUSATE SODIUM 8.6-50 MG PO TABS: 1.0000 | ORAL_TABLET | Freq: Every evening | ORAL | Status: DC | PRN

## 2019-09-23 MED ORDER — METOPROLOL TARTRATE 25 MG PO TABS: 25.0000 mg | ORAL_TABLET | Freq: Two times a day (BID) | ORAL | Status: DC

## 2019-09-23 MED ORDER — DIPHENHYDRAMINE HCL 50 MG/ML IJ SOLN
25.0000 mg | Freq: Once | INTRAMUSCULAR | Status: AC
  Administered 2019-09-23: 25 mg via INTRAVENOUS
  Filled 2019-09-23: qty 1

## 2019-09-23 MED ORDER — INSULIN ASPART 100 UNIT/ML ~~LOC~~ SOLN: 0.0000 [IU] | Freq: Every day | SUBCUTANEOUS | Status: DC

## 2019-09-23 MED ORDER — ENOXAPARIN SODIUM 40 MG/0.4ML ~~LOC~~ SOLN: 40.0000 mg | SUBCUTANEOUS | Status: DC

## 2019-09-23 MED ORDER — ACETAMINOPHEN 650 MG RE SUPP: 650.0000 mg | Freq: Four times a day (QID) | RECTAL | Status: DC | PRN

## 2019-09-23 MED ORDER — FENTANYL CITRATE (PF) 100 MCG/2ML IJ SOLN
50.0000 ug | Freq: Once | INTRAMUSCULAR | Status: AC
  Administered 2019-09-23: 50 ug via INTRAVENOUS
  Filled 2019-09-23: qty 2

## 2019-09-23 MED ORDER — MORPHINE SULFATE (PF) 4 MG/ML IV SOLN
4.0000 mg | Freq: Once | INTRAVENOUS | Status: AC
  Administered 2019-09-23: 4 mg via INTRAVENOUS
  Filled 2019-09-23: qty 1

## 2019-09-23 MED ORDER — ACETAMINOPHEN 325 MG PO TABS: 650.0000 mg | ORAL_TABLET | Freq: Four times a day (QID) | ORAL | Status: DC | PRN

## 2019-09-23 NOTE — ED Provider Notes (Signed)
Emergency Department Provider Note   I have reviewed the triage vital signs and the nursing notes.   HISTORY  Chief Complaint No chief complaint on file.   HPI Alejandro Blair is a 44 y.o. male with past medical history of chronic pain, diabetes, CAD status post CABG last followed by cardiology in [place] presents to the emergency department with acute onset left chest pressure with diaphoresis.  Pain radiates down the left arm and feels similar to prior CAD episodes.  He states that he may have had an episode of syncope as well but cannot be sure.  He states that he felt lightheaded and somewhat diaphoretic with the chest discomfort and went outside to get some air.  He is currently staying at a hotel and told someone there is having chest pain and they called 911.  He continues to have chest discomfort and arrives by EMS.  He was given aspirin but reports an intolerance to nitroglycerin which he says results in hypotension for him and that he cannot take it.  He tells me he typically receives morphine.    No past medical history on file.  There are no problems to display for this patient.  Allergies Coconut oil, Codeine, Ibuprofen, Iodinated diagnostic agents, Ketorolac tromethamine, Tramadol, Ketorolac, Morphine, and Tolmetin  No family history on file.  Social History Social History   Tobacco Use  . Smoking status: Not on file  Substance Use Topics  . Alcohol use: Not on file  . Drug use: Not on file    Review of Systems  Constitutional: No fever/chills Eyes: No visual changes. ENT: No sore throat. Cardiovascular: Positive chest pain. Question of syncope vs near syncope.  Respiratory: Denies shortness of breath. Gastrointestinal: No abdominal pain.  Positive nausea, no vomiting.  No diarrhea.  No constipation. Genitourinary: Negative for dysuria. Musculoskeletal: Negative for back pain. Positive left arm pain.  Skin: Negative for rash. Neurological:  Negative for headaches, focal weakness or numbness.  10-point ROS otherwise negative.  ____________________________________________   PHYSICAL EXAM:  VITAL SIGNS: ED Triage Vitals [09/23/19 1531]  Enc Vitals Group     BP (!) 147/99     Pulse Rate 66     Resp 18     Temp 98.7 F (37.1 C)     Temp Source Oral     SpO2 96 %   Constitutional: Alert and oriented. Well appearing and in no acute distress. Eyes: Conjunctivae are normal. Head: Atraumatic. Nose: No congestion/rhinnorhea. Mouth/Throat: Mucous membranes are moist.  Neck: No stridor.  Cardiovascular: Normal rate, regular rhythm. Good peripheral circulation. Grossly normal heart sounds.   Respiratory: Normal respiratory effort.  No retractions. Lungs CTAB. Gastrointestinal: Soft and nontender. No distention.  Musculoskeletal: No lower extremity tenderness nor edema. No gross deformities of extremities. Neurologic:  Normal speech and language. No gross focal neurologic deficits are appreciated.  Skin:  Skin is warm, dry and intact. No rash noted.  ____________________________________________   LABS (all labs ordered are listed, but only abnormal results are displayed)  Labs Reviewed  BASIC METABOLIC PANEL - Abnormal; Notable for the following components:      Result Value   Glucose, Bld 102 (*)    All other components within normal limits  RESPIRATORY PANEL BY RT PCR (FLU A&B, COVID)  CBC  TROPONIN I (HIGH SENSITIVITY)   ____________________________________________  EKG   EKG Interpretation  Date/Time:  Wednesday Sep 23 2019 15:29:20 EDT Ventricular Rate:  71 PR Interval:  166 QRS Duration: 94  QT Interval:  374 QTC Calculation: 406 R Axis:   49 Text Interpretation: Normal sinus rhythm Possible Left atrial enlargement Borderline ECG No STEMI Confirmed by Nanda Quinton [phone]) on 09/23/2019 4:23:16 PM       ____________________________________________  RADIOLOGY  DG Chest 2 View  Result Date:  09/23/2019 CLINICAL DATA:  Chest pain.  Headache EXAM: CHEST - 2 VIEW COMPARISON:  09/22/2019 FINDINGS: Midline sternotomy. Normal mediastinum and cardiac silhouette. No effusion infiltrate pneumothorax. Rounded 9 mm density inferior to the carina suggest a lymph node or vessel. IMPRESSION: No active cardiopulmonary disease. Electronically Signed   By: Suzy Bouchard M.D.   On: 09/23/2019 16:14    ____________________________________________   PROCEDURES  Procedure(s) performed:   Procedures  None  ____________________________________________   INITIAL IMPRESSION / ASSESSMENT AND PLAN / ED COURSE  Pertinent labs & imaging results that were available during my care of the patient were reviewed by me and considered in my medical decision making (see chart for details).   Patient presents emergency department for evaluation of acute onset chest discomfort radiating to the left arm.  He has a history of 3 prior heart attacks and states this does feel similar.  He was given aspirin in route.  I will add on morphine and Zofran.  Patient reports an intolerance to nitroglycerin.  I have briefly reviewed some of the patient's care everywhere notes from [place].  He has been labeled with pain seeking type behavior during some of those ED encounters.  I am not getting a strong indication at that is happening here.  He states he does frequently present to the emergency department with chest discomfort but states that this feels more concerning to him due to the type and severity of pain today.   Labs and CXR reassuring. Patient is high risk, however, with no recent provocative testing in review of Care Everywhere. Will admit for obs and troponin trending.   Discussed patient's case with [facility], Dr. Andria Frames to request admission. Patient and family (if present) updated with plan. Care transferred to [facility] service.  I reviewed all nursing notes, vitals, pertinent old records, EKGs, labs, imaging (as  available).  ____________________________________________  FINAL CLINICAL IMPRESSION(S) / ED DIAGNOSES  Final diagnoses:  Precordial chest pain    MEDICATIONS GIVEN DURING THIS VISIT:  Medications  sodium chloride flush (NS) 0.9 % injection 3 mL (3 mLs Intravenous Given 09/23/19 1716)  morphine 4 MG/ML injection 4 mg (4 mg Intravenous Given 09/23/19 1709)  ondansetron (ZOFRAN) injection 4 mg (4 mg Intravenous Given 09/23/19 1709)  diphenhydrAMINE (BENADRYL) injection 25 mg (25 mg Intravenous Given 09/23/19 1715)  fentaNYL (SUBLIMAZE) injection 50 mcg (50 mcg Intravenous Given 09/23/19 1857)    Note:  This document was prepared using Dragon voice recognition software and may include unintentional dictation errors.  Nanda Quinton, MD, [facility] Emergency Medicine    Aamir Mclinden, Wonda Old[facility], MD 09/23/19 [phone]

## 2019-09-23 NOTE — H&P (Addendum)
History and Physical    Alejandro Blair OVZ:[id number] DOB: 1975-08-18 DOA: 09/23/2019  PCP: No primary care provider on file.  Patient coming from: [facility]  I have personally briefly reviewed patient's old medical records in [place]  Chief Complaint: Chest pain  HPI: Alejandro Blair is a 44 y.o. male with medical history significant of T2DM, HTN, HLD, CAD s/p CABG x 2, Nephrolithiasis, Depression, Tobacco use and documented concerns regarding pain seeking behavior who presents now with chest pain.  Patient reports he is recently moved down here and has been staying at a local hotel.  He reports to me he has ongoing chest pain intermittently for the last several months but has not had any pain for last 1 week.  He reports episode earlier today (1 hr prior to presenting to ER) was more severe in onset.  He describes squeezing chest pain radiating to the left arm and jaw.  He states it was 10 out of 10 earlier and has now improved to 7 out of 10 after receiving morphine and fentanyl in the emergency room.  He denies any associated nausea or diaphoresis but does report shortness of breath.  Apparently he was witnessed by front desk to pass out while seated and they called 911.  Per review of chart patient does have history of bypass.  He states he has had 3 heart attacks and 2 bypass surgeries - 2007 and 2018 per his report.  He had been planned for a stress test but states he is allergic to the dye.  He did also have possible need for left heart cath but left AMA prior to having this done in [place].  There have been notes documenting pain seeking behavior as well as refusal of care and verbal aggression in hospitals.  He does tell me today that the morphine gives him hives and the only medication that really works is one that begins with a D.  He also reports he cannot tolerate nitroglycerin due to hypotension.  He has refused heparin gtt in the past.  He is an active smoker, reports down to  3 cigarettes a day.  He states he rarely uses alcohol.  He denies drugs.  He denies any fevers, chills.  No orthopnea or PND.  He has not noticed any lower extremity edema.   Review of Systems: As per HPI otherwise 10 point review of systems negative.   Past Medical Hx T2DM, HTN, HLD, CAD s/p CABG x 2, Nephrolithiasis, Depression, Tobacco use  Social Hx Denies drugs, smokes 3 cigarettes a day, states rare EtOH use  Family Hx - reviewed,non-contributory  Prior to Admission medications   Medication Sig Start Date End Date Taking? Authorizing Provider  metoprolol succinate (TOPROL-XL) 50 MG 24 hr tablet Take 1 tablet by mouth daily. 09/11/19  Yes [provider]    Physical Exam: Vitals:   09/23/19 1630 09/23/19 1645 09/23/19 1700 09/23/19 1715  BP: 136/67 140/76 140/82 105/82  Pulse: (!) 58 65 61 62  Resp: 19 (!) 23 (!) 21 (!) 23  Temp:      TempSrc:      SpO2: 98% 100% 96% 98%    Vitals:   09/23/19 1630 09/23/19 1645 09/23/19 1700 09/23/19 1715  BP: 136/67 140/76 140/82 105/82  Pulse: (!) 58 65 61 62  Resp: 19 (!) 23 (!) 21 (!) 23  Temp:      TempSrc:      SpO2: 98% 100% 96% 98%  Constitutional: NAD, calm, comfortable Eyes: EOMI, lids and conjunctivae normal ENMT: Mucous membranes are moist. Posterior pharynx clear of any exudate or lesions.Normal dentition.  Neck: normal, supple, no masses, no thyromegaly Respiratory: clear to auscultation bilaterally, no wheezing, no crackles. Normal respiratory effort. No accessory muscle use.  Cardiovascular: Regular rate and rhythm, no murmurs / rubs / gallops. No extremity edema. 2+ pedal pulses. No carotid bruits.  Abdomen: no tenderness, no masses palpated. No hepatosplenomegaly. Bowel sounds positive.  Musculoskeletal: no clubbing / cyanosis. No joint deformity upper and lower extremities. Good ROM, no contractures. Normal muscle tone.  Skin: no rashes, lesions, ulcers. No induration Neurologic: CN 2-12 grossly  intact. Sensation and Strength intact. Psychiatric: Normal judgment and insight. Alert and oriented x 3. Normal mood.    Labs on Admission: I have personally reviewed following labs and imaging studies  CBC: Recent Labs  Lab 09/23/19 1712  WBC 5.7  HGB 14.0  HCT 44.0  MCV 95.2  PLT 193   Basic Metabolic Panel: Recent Labs  Lab 09/23/19 1712  NA 140  K 4.1  CL 106  CO2 23  GLUCOSE 102*  BUN 10  CREATININE 0.84  CALCIUM 9.3   GFR: CrCl cannot be calculated (Unknown ideal weight.). Liver Function Tests: No results for input(s): AST, ALT, ALKPHOS, BILITOT, PROT, ALBUMIN in the last 168 hours. No results for input(s): LIPASE, AMYLASE in the last 168 hours. No results for input(s): AMMONIA in the last 168 hours. Coagulation Profile: No results for input(s): INR, PROTIME in the last 168 hours. Cardiac Enzymes: No results for input(s): CKTOTAL, CKMB, CKMBINDEX, TROPONINI in the last 168 hours. BNP (last 3 results) No results for input(s): PROBNP in the last 8760 hours. HbA1C: No results for input(s): HGBA1C in the last 72 hours. CBG: No results for input(s): GLUCAP in the last 168 hours. Lipid Profile: No results for input(s): CHOL, HDL, LDLCALC, TRIG, CHOLHDL, LDLDIRECT in the last 72 hours. Thyroid Function Tests: No results for input(s): TSH, T4TOTAL, FREET4, T3FREE, THYROIDAB in the last 72 hours. Anemia Panel: No results for input(s): VITAMINB12, FOLATE, FERRITIN, TIBC, IRON, RETICCTPCT in the last 72 hours. Urine analysis: No results found for: COLORURINE, APPEARANCEUR, LABSPEC, PHURINE, GLUCOSEU, HGBUR, BILIRUBINUR, KETONESUR, PROTEINUR, UROBILINOGEN, NITRITE, LEUKOCYTESUR  Radiological Exams on Admission: DG Chest 2 View  Result Date: 09/23/2019 CLINICAL DATA:  Chest pain.  Headache EXAM: CHEST - 2 VIEW COMPARISON:  09/22/2019 FINDINGS: Midline sternotomy. Normal mediastinum and cardiac silhouette. No effusion infiltrate pneumothorax. Rounded 9 mm density  inferior to the carina suggest a lymph node or vessel. IMPRESSION: No active cardiopulmonary disease. Electronically Signed   By: Genevive Bi M.D.   On: 09/23/2019 16:14    EKG: Independently reviewed.   Assessment/Plan [name] is a 44 y.o. male with medical history significant of T2DM, HTN, HLD, CAD s/p CABG x 2, Nephrolithiasis, Depression, Tobacco use and documented concerns regarding pain seeking behavior who presents now with chest pain  # Chest pain/Angina # Hx of CAD s/p CABG x 2 - patient's hx is concerning for angina, worsening, and does describe typical symptoms.  He does not have concerning dynamic ischemic findings on EKG, initial troponin is neg.  He does raise concerns for pain-seeking behavior independent of his presenting complaint, explaining that he needs the pain med that begins with a D (presumably Dilaudid).  At this time will continue to monitor on telemetry, monitor symptoms overnight, repeat EKG for any new/worsening pain, trend enzymes. - UDS (though received fentanyl/morphine in  ER) - trend trops - Echo - A1c and Lipid profile in AM - continue atorvastatin, aspirin, metoprolol - NPO midnight in the event further ischemic eval warranted - if worsening, ongoing pain, recommend consultation with Cardiology in AM - no hep gtt at this time, however if worsening angina will begin  # HTN - presently reports being on metoprolol tartrate 25 mg BID (care everywhere shows succinate 50 mg daily) - continue metoprolol tartrate 25 mg BID  # HLD - continue atorvastatin  # Tobacco use Disorder - patient reports decreasing use, currently down 3 cigarettes per day  Other issues not actively managed  # Nephrolithiasis # Depression   DVT prophylaxis: Lovenox Code Status: Full Family Communication: Patient reports he has notified cousins in the area Admission status: Obs, cardiac tele   Clydia Llano MD [facility] Pager [phone]  If  7PM-7AM, please contact night-coverage [email or web address] Password [facility]  09/23/2019, 6:22 PM

## 2019-09-23 NOTE — Discharge Summary (Signed)
AMA Discharge Summary  Please reference H&P from a couple of hours prior for course and plan.  Patient left AMA per nursing, did not sign paperwork, just left.  Briefly patient admitted for angina, planned to admit and monitor overnight with further work-up as appropriate.  Clydia Llano, MD

## 2019-09-23 NOTE — ED Notes (Signed)
Pt upset regarding pain medication. Had spoken to several people regarding pain management. Informed pt of medications ordered and pt stated "those will not work for this pain." Offered to contact MD for other orders but patient refused stating "this is the worst experience I've ever had in the hospital." Pt refused to sign AMA forms. IV removed from R forearm, pt directed to atm to get back to hotel. Text page sent to admitting MD.

## 2019-09-23 NOTE — ED Triage Notes (Signed)
[facility] 10[facility]10 chest pain described as a pressure that radiates into his L arm and his jaw. Sudden onset at approx 1430. Hx 3 MI, 2 bypass. 324mg  ASA given en route. VSS. 12 lead unremarkable.

## 2019-09-23 NOTE — Telephone Encounter (Signed)
Hello,  We received a fax from [facility] stating they do not accept referrals from PCP, this would need to come from dentist.    Ok to close referral?    Please advise.    Thank you!

## 2019-09-23 NOTE — Telephone Encounter (Signed)
Good Morning,  There is a little confusion on this patients insurance coverage that we need help with. Half of the registration says [employer] and part says medicaid/[employer]. We are unable to get any approvals until this has been fixed and we know what he has. Please advise. Thank you

## 2019-09-24 ENCOUNTER — Observation Stay (HOSPITAL_COMMUNITY)
Admission: EM | Admit: 2019-09-24 | Discharge: 2019-09-25 | Disposition: A | Discharge status: Home / Self Care | Payer: Medicaid - Out of State | Attending: Internal Medicine | Admitting: Internal Medicine

## 2019-09-24 ENCOUNTER — Encounter (HOSPITAL_COMMUNITY): Payer: Self-pay | Admitting: Emergency Medicine

## 2019-09-24 ENCOUNTER — Emergency Department (HOSPITAL_COMMUNITY): Payer: Medicaid - Out of State

## 2019-09-24 DIAGNOSIS — I2510 Atherosclerotic heart disease of native coronary artery without angina pectoris: Secondary | ICD-10-CM | POA: Diagnosis not present

## 2019-09-24 DIAGNOSIS — I252 Old myocardial infarction: Secondary | ICD-10-CM | POA: Insufficient documentation

## 2019-09-24 DIAGNOSIS — Z720 Tobacco use: Secondary | ICD-10-CM | POA: Diagnosis present

## 2019-09-24 DIAGNOSIS — Z7982 Long term (current) use of aspirin: Secondary | ICD-10-CM | POA: Insufficient documentation

## 2019-09-24 DIAGNOSIS — Z5329 Procedure and treatment not carried out because of patient's decision for other reasons: Secondary | ICD-10-CM | POA: Insufficient documentation

## 2019-09-24 DIAGNOSIS — R079 Chest pain, unspecified: Secondary | ICD-10-CM | POA: Diagnosis present

## 2019-09-24 DIAGNOSIS — Z20822 Contact with and (suspected) exposure to covid-19: Secondary | ICD-10-CM | POA: Insufficient documentation

## 2019-09-24 DIAGNOSIS — Z951 Presence of aortocoronary bypass graft: Secondary | ICD-10-CM | POA: Insufficient documentation

## 2019-09-24 DIAGNOSIS — R0789 Other chest pain: Principal | ICD-10-CM | POA: Insufficient documentation

## 2019-09-24 DIAGNOSIS — Z79899 Other long term (current) drug therapy: Secondary | ICD-10-CM | POA: Diagnosis not present

## 2019-09-24 DIAGNOSIS — E1159 Type 2 diabetes mellitus with other circulatory complications: Secondary | ICD-10-CM | POA: Diagnosis present

## 2019-09-24 DIAGNOSIS — E1165 Type 2 diabetes mellitus with hyperglycemia: Secondary | ICD-10-CM | POA: Insufficient documentation

## 2019-09-24 HISTORY — DX: Type 2 diabetes mellitus without complications: E11.9

## 2019-09-24 HISTORY — DX: Acute myocardial infarction, unspecified: I21.9

## 2019-09-24 LAB — CBC WITH DIFFERENTIAL/PLATELET
Abs Immature Granulocytes: 0.04 10*3/uL (ref 0.00–0.07)
Basophils Absolute: 0 10*3/uL (ref 0.0–0.1)
Basophils Relative: 0 %
Eosinophils Absolute: 0.1 10*3/uL (ref 0.0–0.5)
Eosinophils Relative: 2 %
HCT: 44.8 % (ref 39.0–52.0)
Hemoglobin: 13.9 g/dL (ref 13.0–17.0)
Immature Granulocytes: 1 %
Lymphocytes Relative: 33 %
Lymphs Abs: 2.3 10*3/uL (ref 0.7–4.0)
MCH: 29.7 pg (ref 26.0–34.0)
MCHC: 31 g/dL (ref 30.0–36.0)
MCV: 95.7 fL (ref 80.0–100.0)
Monocytes Absolute: 0.4 10*3/uL (ref 0.1–1.0)
Monocytes Relative: 6 %
Neutro Abs: 4.1 10*3/uL (ref 1.7–7.7)
Neutrophils Relative %: 58 %
Platelets: 239 10*3/uL (ref 150–400)
RBC: 4.68 MIL/uL (ref 4.22–5.81)
RDW: 12.9 % (ref 11.5–15.5)
WBC: 7 10*3/uL (ref 4.0–10.5)
nRBC: 0 % (ref 0.0–0.2)

## 2019-09-24 LAB — BASIC METABOLIC PANEL
Anion gap: 10 (ref 5–15)
BUN: 14 mg/dL (ref 6–20)
CO2: 24 mmol/L (ref 22–32)
Calcium: 9.4 mg/dL (ref 8.9–10.3)
Chloride: 105 mmol/L (ref 98–111)
Creatinine, Ser: 0.89 mg/dL (ref 0.61–1.24)
GFR calc Af Amer: >60 mL/min (ref >60)
GFR calc non Af Amer: >60 mL/min (ref >60)
Glucose, Bld: 112 mg/dL — ABNORMAL HIGH (ref 70–99)
Potassium: 4.3 mmol/L (ref 3.5–5.1)
Sodium: 139 mmol/L (ref 135–145)

## 2019-09-24 LAB — TROPONIN I (HIGH SENSITIVITY): Troponin I (High Sensitivity): 3 ng/L (ref <18)

## 2019-09-24 MED ORDER — ENOXAPARIN SODIUM 40 MG/0.4ML ~~LOC~~ SOLN: 40.0000 mg | Freq: Every day | SUBCUTANEOUS | Status: DC

## 2019-09-24 MED ORDER — ATORVASTATIN CALCIUM 40 MG PO TABS
40.0000 mg | ORAL_TABLET | Freq: Every day | ORAL | Status: DC
  Administered 2019-09-25: 40 mg via ORAL
  Filled 2019-09-24: qty 1

## 2019-09-24 MED ORDER — INSULIN ASPART 100 UNIT/ML ~~LOC~~ SOLN
0.0000 [IU] | SUBCUTANEOUS | Status: DC
  Administered 2019-09-25: 1 [IU] via SUBCUTANEOUS
  Administered 2019-09-25: 2 [IU] via SUBCUTANEOUS

## 2019-09-24 MED ORDER — METOPROLOL TARTRATE 25 MG PO TABS
25.0000 mg | ORAL_TABLET | Freq: Two times a day (BID) | ORAL | Status: DC
  Filled 2019-09-24: qty 1

## 2019-09-24 MED ORDER — FENTANYL CITRATE (PF) 100 MCG/2ML IJ SOLN
50.0000 ug | Freq: Once | INTRAMUSCULAR | Status: AC
  Administered 2019-09-24: 50 ug via INTRAVENOUS
  Filled 2019-09-24: qty 2

## 2019-09-24 MED ORDER — FENTANYL CITRATE (PF) 100 MCG/2ML IJ SOLN: 50.0000 ug | INTRAMUSCULAR | Status: DC | PRN

## 2019-09-24 MED ORDER — HYDROMORPHONE HCL 1 MG/ML IJ SOLN
0.5000 mg | INTRAMUSCULAR | Status: DC | PRN
  Administered 2019-09-24 – 2019-09-25 (×2): 0.5 mg via INTRAVENOUS
  Filled 2019-09-24 (×2): qty 1

## 2019-09-24 MED ORDER — ASPIRIN 81 MG PO CHEW
81.0000 mg | CHEWABLE_TABLET | Freq: Every day | ORAL | Status: DC
  Administered 2019-09-25: 81 mg via ORAL
  Filled 2019-09-24: qty 1

## 2019-09-24 MED ORDER — SODIUM CHLORIDE 0.9% FLUSH
3.0000 mL | Freq: Once | INTRAVENOUS | Status: AC
  Administered 2019-09-24: 3 mL via INTRAVENOUS

## 2019-09-24 MED ORDER — ASPIRIN 81 MG PO CHEW
324.0000 mg | CHEWABLE_TABLET | Freq: Once | ORAL | Status: AC
  Administered 2019-09-24: 324 mg via ORAL
  Filled 2019-09-24: qty 4

## 2019-09-24 NOTE — H&P (Signed)
History and Physical    Alejandro Blair UMP:[id number] DOB: 09-14-1975 DOA: 09/24/2019  PCP: No primary care provider on file.  Patient coming from: Home.  Chief Complaint: Chest pain.  HPI: [name] is a 44 y.o. male with history of CAD status post CABG, diabetes mellitus type 2 ongoing tobacco abuse, morbid obesity presents to the ER with complaint of chest pain.  Patient had come to the ER a day ago and signed out AGAINST MEDICAL ADVICE.  Patient is originally from [place] is on his way to [place] and took a break in [place] when started developing chest pain.  Chest pain is left-sided pressure-like radiating to his left arm.  On the first day when he came to the hospital yesterday patient chest pain lasted for around 6 hours and resolved.  Today again it stayed for about 4 to 5 hours with no shortness of breath productive cough fever chills had some nausea with no abdominal pain.  ED Course: Chest pain resolved with fentanyl injection.  Patient states he cannot take nitroglycerin.  EKG cardiac markers chest x-ray were unremarkable Covid test is negative patient is afebrile not hypoxic.  Cardiology was notified admitted for chest pain rule out ACS.  Review of Systems: As per HPI, rest all negative.   Past Medical History:  Diagnosis Date  . Diabetes mellitus without complication (HCC)   . Myocardial infarct [facility])     Past Surgical History:  Procedure Laterality Date  . CORONARY ANGIOPLASTY WITH STENT PLACEMENT       reports that he has been smoking. He has never used smokeless tobacco. No history on file for alcohol and drug.  Allergies  Allergen Reactions  . Coconut Oil Anaphylaxis  . Codeine Hives  . Ibuprofen Hives and Rash  . Iodinated Diagnostic Agents Hives  . Ketorolac Tromethamine Anaphylaxis and Hives    Tolerates ASA   . Tramadol Anaphylaxis and Hives  . Ketorolac Hives  . Morphine Hives    Denies allergy  . Tolmetin     Family History  Problem  Relation Age of Onset  . CAD Mother   . Brain cancer Mother   . CAD Father   . Diabetes Mellitus II Father     Prior to Admission medications   Medication Sig Start Date End Date Taking? Authorizing Provider  aspirin 81 MG chewable tablet Chew 1 tablet by mouth daily.   Yes [provider]  atorvastatin (LIPITOR) 40 MG tablet Take 1 tablet by mouth daily. 09/11/19  Yes [provider]  metoprolol succinate (TOPROL-XL) 50 MG 24 hr tablet Take 1 tablet by mouth daily. 09/11/19  Yes [provider]    Physical Exam: Constitutional: Moderately built and nourished. Vitals:   09/24/19 1845 09/24/19 2030 09/24/19 2100 09/24/19 2115  BP: (!) 159/87 135/72 (!) 146/80 139/71  Pulse: 63 (!) 58 (!) 55 (!) 55  Resp: 16 (!) 21 18 (!) 21  Temp:      TempSrc:      SpO2: 99% 99% 98% 98%   Eyes: Anicteric no pallor. ENMT: No discharge from the ears eyes nose or mouth. Neck: No mass felt.  No neck rigidity.  No JVD appreciated. Respiratory: No rhonchi or crepitations. Cardiovascular: S1-S2 heard. Abdomen: Soft nontender bowel sounds present. Musculoskeletal: No edema. Skin: No rash. Neurologic: Alert awake oriented to time place and person.  Moves all extremities.  Psychiatric: Appears normal.  Normal affect.   Labs on Admission: I have personally reviewed following labs and  imaging studies  CBC: Recent Labs  Lab 09/23/19 1712 09/24/19 1951  WBC 5.7 7.0  NEUTROABS  --  4.1  HGB 14.0 13.9  HCT 44.0 44.8  MCV 95.2 95.7  PLT 193 239   Basic Metabolic Panel: Recent Labs  Lab 09/23/19 1712 09/24/19 1951  NA 140 139  K 4.1 4.3  CL 106 105  CO2 23 24  GLUCOSE 102* 112*  BUN 10 14  CREATININE 0.84 0.89  CALCIUM 9.3 9.4   GFR: CrCl cannot be calculated (Unknown ideal weight.). Liver Function Tests: No results for input(s): AST, ALT, ALKPHOS, BILITOT, PROT, ALBUMIN in the last 168 hours. No results for input(s): LIPASE, AMYLASE in the last 168  hours. No results for input(s): AMMONIA in the last 168 hours. Coagulation Profile: No results for input(s): INR, PROTIME in the last 168 hours. Cardiac Enzymes: No results for input(s): CKTOTAL, CKMB, CKMBINDEX, TROPONINI in the last 168 hours. BNP (last 3 results) No results for input(s): PROBNP in the last 8760 hours. HbA1C: No results for input(s): HGBA1C in the last 72 hours. CBG: No results for input(s): GLUCAP in the last 168 hours. Lipid Profile: No results for input(s): CHOL, HDL, LDLCALC, TRIG, CHOLHDL, LDLDIRECT in the last 72 hours. Thyroid Function Tests: No results for input(s): TSH, T4TOTAL, FREET4, T3FREE, THYROIDAB in the last 72 hours. Anemia Panel: No results for input(s): VITAMINB12, FOLATE, FERRITIN, TIBC, IRON, RETICCTPCT in the last 72 hours. Urine analysis: No results found for: COLORURINE, APPEARANCEUR, LABSPEC, PHURINE, GLUCOSEU, HGBUR, BILIRUBINUR, KETONESUR, PROTEINUR, UROBILINOGEN, NITRITE, LEUKOCYTESUR Sepsis Labs: @LABRCNTIP (procalcitonin:4,lacticidven:4) ) Recent Results (from the past 240 hour(s))  Respiratory Panel by RT PCR (Flu A&B, Covid) - Nasopharyngeal Swab     Status: None   Collection Time: 09/23/19  6:57 PM   Specimen: Nasopharyngeal Swab  Result Value Ref Range Status   SARS Coronavirus 2 by RT PCR NEGATIVE NEGATIVE Final    Comment: (NOTE) SARS-CoV-2 target nucleic acids are NOT DETECTED. The SARS-CoV-2 RNA is generally detectable in upper respiratoy specimens during the acute phase of infection. The lowest concentration of SARS-CoV-2 viral copies this assay can detect is 131 copies/mL. A negative result does not preclude SARS-Cov-2 infection and should not be used as the sole basis for treatment or other patient management decisions. A negative result may occur with  improper specimen collection/handling, submission of specimen other than nasopharyngeal swab, presence of viral mutation(s) within the areas targeted by this assay, and  inadequate number of viral copies (<131 copies/mL). A negative result must be combined with clinical observations, patient history, and epidemiological information. The expected result is Negative. Fact Sheet for Patients:  11/23/19 Fact Sheet for Healthcare Providers:  https://www.moore.com/ This test is not yet ap proved or cleared by the https://www.young.biz/ FDA and  has been authorized for detection and/or diagnosis of SARS-CoV-2 by FDA under an Emergency Use Authorization (EUA). This EUA will remain  in effect (meaning this test can be used) for the duration of the COVID-19 declaration under Section 564(b)(1) of the Act, 21 U.S.C. section 360bbb-3(b)(1), unless the authorization is terminated or revoked sooner.    Influenza A by PCR NEGATIVE NEGATIVE Final   Influenza B by PCR NEGATIVE NEGATIVE Final    Comment: (NOTE) The Xpert Xpress SARS-CoV-2/FLU/RSV assay is intended as an aid in  the diagnosis of influenza from Nasopharyngeal swab specimens and  should not be used as a sole basis for treatment. Nasal washings and  aspirates are unacceptable for Xpert Xpress SARS-CoV-2/FLU/RSV  testing.  Fact Sheet for Patients: PinkCheek.be Fact Sheet for Healthcare Providers: GravelBags.it This test is not yet approved or cleared by the [place] FDA and  has been authorized for detection and/or diagnosis of SARS-CoV-2 by  FDA under an Emergency Use Authorization (EUA). This EUA will remain  in effect (meaning this test can be used) for the duration of the  Covid-19 declaration under Section 564(b)(1) of the Act, 21  U.S.C. section 360bbb-3(b)(1), unless the authorization is  terminated or revoked. Performed at [facility], [place] [address] [place], [place] [address]      Radiological Exams on Admission: DG Chest 2 View  Result Date: 09/24/2019 CLINICAL DATA:  Left chest  pain EXAM: CHEST - 2 VIEW COMPARISON:  09/23/2019 FINDINGS: Cardiomegaly status post median sternotomy. Both lungs are clear. The visualized skeletal structures are unremarkable. IMPRESSION: Cardiomegaly without acute abnormality of the lungs. Electronically Signed   By: Eddie Candle M.D.   On: 09/24/2019 14:25   DG Chest 2 View  Result Date: 09/23/2019 CLINICAL DATA:  Chest pain.  Headache EXAM: CHEST - 2 VIEW COMPARISON:  09/22/2019 FINDINGS: Midline sternotomy. Normal mediastinum and cardiac silhouette. No effusion infiltrate pneumothorax. Rounded 9 mm density inferior to the carina suggest a lymph node or vessel. IMPRESSION: No active cardiopulmonary disease. Electronically Signed   By: Suzy Bouchard M.D.   On: 09/23/2019 16:14    EKG: Independently reviewed.  Normal sinus rhythm.  Assessment/Plan Principal Problem:   Chest pain Active Problems:   Type 2 diabetes mellitus with vascular disease ([place])   CAD in native artery   Tobacco abuse    1. Chest pain with history of CAD status post CABG concerning for angina for which cardiology was notified.  Patient chest pain resolved with fentanyl.  Will cycle cardiac markers continue with beta-blockers statins and aspirin.  We will keep patient n.p.o. past midnight anticipation of cardiac procedure. 2. Diabetes mellitus type 2 takes Metformin at home.  We will keep patient on sliding scale coverage for now. 3. Tobacco abuse advised about quitting. 4. Morbid obesity will need nutritional counseling.   DVT prophylaxis: Lovenox. Code Status: Full code. Family Communication: Discussed with patient. Disposition Plan: Home. Consults called: Cardiology. Admission status: Observation.   Rise Patience MD [facility] Pager [phone].  If 7PM-7AM, please contact night-coverage [email or web address] Password [email or web address]  09/24/2019, 11:35 PM

## 2019-09-24 NOTE — ED Triage Notes (Signed)
Arrives via [facility] for c/o left chest pain with pain radiating to arm x45 mins, significant cardiac hx. States was seen a few days ago for chest pain but reports this pain feels different and more like previous MI, HR 66, BP 143/80, CBG 209, 12 lead unremarkable. Pt refuses blood drawn in triage and requested to speak to patient advocate upon arrival to ED.

## 2019-09-24 NOTE — ED Provider Notes (Cosign Needed)
[facility] EMERGENCY DEPARTMENT Provider Note   CSN: [id number] Arrival date & time: 09/24/19  1420     History Chief Complaint  Patient presents with  . Chest Pain    Alejandro Blair is a 44 y.o. male.  HPI Patient is a 44 year old male with history of CAD s/p bypass surgery, who presents for chest pain.  Onset of chest pain was yesterday.  He describes it as identical to previous heart attack chest pain.  He came into the ED yesterday.  Initial troponin was negative.  He was admitted for observation and provocative stress testing.  Last night, he left AMA.  He reports that the reason for leaving AMA was a family tragedy.  Earlier today, around 2 PM, he was woken up from sleep by similar chest pain that had significantly worsened in severity.  Since that time, chest pain has been constant.  He currently reports that the pain is 10 out of 10.  He has not taken an aspirin yet today.  He did take 1 yesterday.  He states that nitroglycerin makes him pass out, this happened as recently as 3 weeks ago.  He denies any recent fevers, chills, vomiting or diarrhea.    Past Medical History:  Diagnosis Date  . Diabetes mellitus without complication (HCC)   . Myocardial infarct [facility])     Patient Active Problem List   Diagnosis Date Noted  . Type 2 diabetes mellitus with vascular disease (HCC) 09/24/2019  . CAD in native artery 09/24/2019  . Tobacco abuse 09/24/2019  . Chest pain 09/23/2019    Past Surgical History:  Procedure Laterality Date  . CORONARY ANGIOPLASTY WITH STENT PLACEMENT         Family History  Problem Relation Age of Onset  . CAD Mother   . Brain cancer Mother   . CAD Father   . Diabetes Mellitus II Father     Social History   Tobacco Use  . Smoking status: Current Every Day Smoker  . Smokeless tobacco: Never Used  Substance Use Topics  . Alcohol use: Not on file  . Drug use: Not on file    Home Medications Prior to Admission  medications   Medication Sig Start Date End Date Taking? Authorizing Provider  aspirin 81 MG chewable tablet Chew 1 tablet by mouth daily.   Yes [provider]  atorvastatin (LIPITOR) 40 MG tablet Take 1 tablet by mouth daily. 09/11/19  Yes [provider]  metoprolol succinate (TOPROL-XL) 50 MG 24 hr tablet Take 1 tablet by mouth daily. 09/11/19  Yes [provider]    Allergies    Coconut oil, Codeine, Ibuprofen, Iodinated diagnostic agents, Ketorolac tromethamine, Tramadol, Ketorolac, Morphine, and Tolmetin  Review of Systems   Review of Systems  Constitutional: Negative for activity change, appetite change, chills, diaphoresis, fatigue and fever.  HENT: Negative for ear pain and sore throat.   Eyes: Negative for pain and visual disturbance.  Respiratory: Negative for cough, chest tightness, shortness of breath and wheezing.   Cardiovascular: Positive for chest pain. Negative for palpitations and leg swelling.  Gastrointestinal: Negative for abdominal pain, constipation, diarrhea, nausea and vomiting.  Genitourinary: Negative for dysuria and hematuria.  Musculoskeletal: Positive for neck pain (Radiations from chest). Negative for arthralgias, back pain, myalgias and neck stiffness.  Skin: Negative for color change and rash.  Neurological: Negative for dizziness, seizures, syncope, light-headedness, numbness and headaches.  Hematological: Does not bruise/bleed easily.  All other systems reviewed and are  negative.   Physical Exam Updated Vital Signs BP 136/74   Pulse (!) 53   Temp 98.3 F (36.8 C) (Oral)   Resp 15   SpO2 97%   Physical Exam Vitals and nursing note reviewed.  Constitutional:      General: He is not in acute distress.    Appearance: He is well-developed. He is obese. He is not ill-appearing, toxic-appearing or diaphoretic.  HENT:     Head: Normocephalic and atraumatic.  Eyes:     Conjunctiva/sclera: Conjunctivae normal.  Neck:      Vascular: No JVD.  Cardiovascular:     Rate and Rhythm: Normal rate and regular rhythm.     Pulses:          Radial pulses are 2+ on the right side and 2+ on the left side.     Heart sounds: No murmur.  Pulmonary:     Effort: Pulmonary effort is normal. No respiratory distress.     Breath sounds: Normal breath sounds.  Chest:     Chest wall: No tenderness.  Abdominal:     Palpations: Abdomen is soft.     Tenderness: There is no abdominal tenderness.  Musculoskeletal:     Cervical back: Neck supple.     Right lower leg: No edema.     Left lower leg: No edema.  Skin:    General: Skin is warm and dry.     Capillary Refill: Capillary refill takes less than 2 seconds.  Neurological:     General: No focal deficit present.     Mental Status: He is alert and oriented to person, place, and time.     Cranial Nerves: No cranial nerve deficit.     Motor: No weakness.  Psychiatric:        Mood and Affect: Mood normal.        Behavior: Behavior normal.     ED Results / Procedures / Treatments   Labs (all labs ordered are listed, but only abnormal results are displayed) Labs Reviewed  BASIC METABOLIC PANEL - Abnormal; Notable for the following components:      Result Value   Glucose, Bld 112 (*)    All other components within normal limits  CBG MONITORING, ED - Abnormal; Notable for the following components:   Glucose-Capillary 190 (*)    All other components within normal limits  RESPIRATORY PANEL BY RT PCR (FLU A&B, COVID)  CBC WITH DIFFERENTIAL/PLATELET  CBC  CREATININE, SERUM  RAPID URINE DRUG SCREEN, HOSP PERFORMED  HIV ANTIBODY (ROUTINE TESTING W REFLEX)  BASIC METABOLIC PANEL  CBC  TROPONIN I (HIGH SENSITIVITY)  TROPONIN I (HIGH SENSITIVITY)  TROPONIN I (HIGH SENSITIVITY)    EKG EKG Interpretation  Date/Time:  Thursday Sep 24 2019 13:55:41 EDT Ventricular Rate:  73 PR Interval:  172 QRS Duration: 90 QT Interval:  362 QTC Calculation: 398 R Axis:   60 Text  Interpretation: Normal sinus rhythm Normal ECG No significant change since last tracing Confirmed by Melene Plan [phone]) on 09/24/2019 6:37:38 PM   Radiology DG Chest 2 View  Result Date: 09/24/2019 CLINICAL DATA:  Left chest pain EXAM: CHEST - 2 VIEW COMPARISON:  09/23/2019 FINDINGS: Cardiomegaly status post median sternotomy. Both lungs are clear. The visualized skeletal structures are unremarkable. IMPRESSION: Cardiomegaly without acute abnormality of the lungs. Electronically Signed   By: Lauralyn Primes M.D.   On: 09/24/2019 14:25   DG Chest 2 View  Result Date: 09/23/2019 CLINICAL DATA:  Chest pain.  Headache EXAM: CHEST - 2 VIEW COMPARISON:  09/22/2019 FINDINGS: Midline sternotomy. Normal mediastinum and cardiac silhouette. No effusion infiltrate pneumothorax. Rounded 9 mm density inferior to the carina suggest a lymph node or vessel. IMPRESSION: No active cardiopulmonary disease. Electronically Signed   By: Suzy Bouchard M.D.   On: 09/23/2019 16:14    Procedures Procedures (including critical care time)  Medications Ordered in ED Medications  metoprolol tartrate (LOPRESSOR) tablet 25 mg (has no administration in time range)  atorvastatin (LIPITOR) tablet 40 mg (has no administration in time range)  HYDROmorphone (DILAUDID) injection 0.5 mg (0.5 mg Intravenous Given 09/24/19 2300)  aspirin chewable tablet 81 mg (has no administration in time range)  insulin aspart (novoLOG) injection 0-6 Units (1 Units Subcutaneous Given 09/25/19 0025)  enoxaparin (LOVENOX) injection 40 mg (has no administration in time range)  sodium chloride flush (NS) 0.9 % injection 3 mL (3 mLs Intravenous Given 09/24/19 1936)  aspirin chewable tablet 324 mg (324 mg Oral Given 09/24/19 2014)  fentaNYL (SUBLIMAZE) injection 50 mcg (50 mcg Intravenous Given 09/24/19 2015)    ED Course  I have reviewed the triage vital signs and the nursing notes.  Pertinent labs & imaging results that were available during my care of the  patient were reviewed by me and considered in my medical decision making (see chart for details).    MDM Rules/Calculators/A&P                      Patient is a 44 year old male with history of CAD s/p CABG in the past presents for left-sided chest pain with radiations into his neck and left arm.  He reports that this is characteristic of his chest pain previously, when he had MIs.  He was initially evaluated in this ED yesterday.  He was admitted but left AMA last night.  He reports chest pain is worsened this afternoon.  On arrival, his vital signs are normal.  He is well-appearing, but endorses 10/10 chest pain.  EKG shows no ST segment changes or dynamic changes from yesterday.  Repeat labs, including troponin were drawn.  Aspirin was ordered.  Patient was encouraged to try nitroglycerin in order to treat chest pain.  He reports that he has had reactions to nitroglycerin in the past, causing him to pass out 3 weeks ago.  He requested narcotic pain medication.  Per chart review, patient has had frequent visits to emergency departments in the past, in which he requests narcotic pain medication.  He often leaves AMA.  However, he does have a concerning cardiac history and endorses cardiac type chest pain.  50 mcg of fentanyl were given.  CBC and BMP were normal.  Initial troponin was negative.  Cardiology was contacted and agrees with overnight observation for provocative testing in the morning.  Patient was in agreement with plan.  Patient was again encouraged to try nitroglycerin, perhaps a very low-dose infusion.  He stated that all it would do is give him a headache on top of his chest pain.  Patient refused nitroglycerin medication.   Patient was admitted to hospitalist.  Final Clinical Impression(s) / ED Diagnoses Final diagnoses:  Chest pain, unspecified type    Rx / DC Orders ED Discharge Orders    None       Godfrey Pick, MD 09/25/19 [phone]

## 2019-09-24 NOTE — ED Notes (Signed)
Attempted to start IV; patient stated he is a difficult stick and usually needs an ultrasound.

## 2019-09-25 ENCOUNTER — Encounter (HOSPITAL_COMMUNITY): Payer: Self-pay | Admitting: Internal Medicine

## 2019-09-25 DIAGNOSIS — E1159 Type 2 diabetes mellitus with other circulatory complications: Secondary | ICD-10-CM | POA: Diagnosis not present

## 2019-09-25 DIAGNOSIS — Z951 Presence of aortocoronary bypass graft: Secondary | ICD-10-CM

## 2019-09-25 DIAGNOSIS — Z720 Tobacco use: Secondary | ICD-10-CM

## 2019-09-25 DIAGNOSIS — I208 Other forms of angina pectoris: Secondary | ICD-10-CM

## 2019-09-25 DIAGNOSIS — R079 Chest pain, unspecified: Secondary | ICD-10-CM | POA: Diagnosis not present

## 2019-09-25 LAB — BASIC METABOLIC PANEL
Anion gap: 9 (ref 5–15)
BUN: 13 mg/dL (ref 6–20)
CO2: 28 mmol/L (ref 22–32)
Calcium: 9.1 mg/dL (ref 8.9–10.3)
Chloride: 104 mmol/L (ref 98–111)
Creatinine, Ser: 0.93 mg/dL (ref 0.61–1.24)
GFR calc Af Amer: >60 mL/min (ref >60)
GFR calc non Af Amer: >60 mL/min (ref >60)
Glucose, Bld: 171 mg/dL — ABNORMAL HIGH (ref 70–99)
Potassium: 4.6 mmol/L (ref 3.5–5.1)
Sodium: 141 mmol/L (ref 135–145)

## 2019-09-25 LAB — CBC
HCT: 42.1 % (ref 39.0–52.0)
Hemoglobin: 13.3 g/dL (ref 13.0–17.0)
MCH: 30 pg (ref 26.0–34.0)
MCHC: 31.6 g/dL (ref 30.0–36.0)
MCV: 94.8 fL (ref 80.0–100.0)
Platelets: 215 10*3/uL (ref 150–400)
RBC: 4.44 MIL/uL (ref 4.22–5.81)
RDW: 12.7 % (ref 11.5–15.5)
WBC: 5.9 10*3/uL (ref 4.0–10.5)
nRBC: 0 % (ref 0.0–0.2)

## 2019-09-25 LAB — CBG MONITORING, ED
Glucose-Capillary: 114 mg/dL — ABNORMAL HIGH (ref 70–99)
Glucose-Capillary: 190 mg/dL — ABNORMAL HIGH (ref 70–99)
Glucose-Capillary: 227 mg/dL — ABNORMAL HIGH (ref 70–99)

## 2019-09-25 LAB — CREATININE, SERUM
Creatinine, Ser: 0.97 mg/dL (ref 0.61–1.24)
GFR calc Af Amer: >60 mL/min (ref >60)
GFR calc non Af Amer: >60 mL/min (ref >60)

## 2019-09-25 LAB — RAPID URINE DRUG SCREEN, HOSP PERFORMED
Amphetamines: NOT DETECTED
Barbiturates: NOT DETECTED
Benzodiazepines: NOT DETECTED
Cocaine: NOT DETECTED
Opiates: NOT DETECTED
Tetrahydrocannabinol: NOT DETECTED

## 2019-09-25 LAB — TROPONIN I (HIGH SENSITIVITY)
Troponin I (High Sensitivity): 4 ng/L (ref <18)
Troponin I (High Sensitivity): 3 ng/L (ref <18)

## 2019-09-25 LAB — HIV ANTIBODY (ROUTINE TESTING W REFLEX): HIV Screen 4th Generation wRfx: NONREACTIVE

## 2019-09-25 LAB — RESPIRATORY PANEL BY RT PCR (FLU A&B, COVID)
Influenza A by PCR: NEGATIVE
Influenza B by PCR: NEGATIVE
SARS Coronavirus 2 by RT PCR: NEGATIVE

## 2019-09-25 MED ORDER — HYDROMORPHONE HCL 1 MG/ML IJ SOLN
0.5000 mg | INTRAMUSCULAR | Status: DC | PRN
  Administered 2019-09-25: 0.5 mg via INTRAVENOUS
  Filled 2019-09-25 (×2): qty 1

## 2019-09-25 NOTE — ED Notes (Signed)
Pt is requesting food at this time. Pt states he has been NPO all night waiting for cardiology to see him. I contacted the attending and he advised me to contact cardiology. I paged cardiology and they stated that they should be by this morning and they are unsure if his stress test will be today. When I advised the patient of the answer I received from them, he became upset and insisted on eating. I gave a sandwich to the patient.

## 2019-09-25 NOTE — Progress Notes (Signed)
Patient ID: Alejandro Blair, male   DOB: 12-08-1975, 44 y.o.   MRN: [id number]  PROGRESS NOTE    Alejandro Blair  WNU:[id number] DOB: January 24, 1976 DOA: 09/24/2019 PCP: No primary care provider on file.   Brief Narrative:  44 year old male with history of CAD status post CABG, diabetes mellitus type 2, ongoing tobacco abuse, presented with chest pain.  EKG and cardiac markers were unremarkable.  Chest x-ray and COVID-19 testing was negative.  Cardiology was consulted.  Assessment & Plan:   Chest pain in a patient with history of CAD status post CABG -EKG, cardiac markers unremarkable.  Chest pain resolved with fentanyl in the ED.  Patient apparently cannot tolerate nitroglycerin.  He is asking for more pain medications as he is still in severe pain.  Cardiology evaluation is pending. -Continue aspirin, statin and metoprolol.  Diabetes mellitus type 2 with hyperglycemia -Metformin on hold.  CBGs with SSI.  Tobacco abuse -Patient was advised to quit smoking by admitting hospitalist   DVT prophylaxis: Lovenox  code Status: Full Family Communication: Patient at bedside Disposition Plan: Status is: Observation  The patient remains OBS appropriate and will d/c before 2 midnights.  Dispo: The patient is from: Home              Anticipated d/c is to: Home              Anticipated d/c date is: 1 day              Patient currently is not medically stable to d/c.  Consultants: Cardiology  Procedures: None  Antimicrobials: None   Subjective: Patient seen and examined at bedside.  He states that he is having severe chest pain constantly and asking for pain medication frequency to be increased.  No overnight fever or vomiting.  Objective: Vitals:   09/25/19 0735 09/25/19 0900 09/25/19 1048 09/25/19 1049  BP: (!) 142/81 138/84 120/64   Pulse: (!) 57  (!) 58 (!) 58  Resp: 15 11 17  (!) 25  Temp:  98.3 F (36.8 C)    TempSrc:  Oral    SpO2: 98% 98% 98% 98%   No intake or output data  in the 24 hours ending 09/25/19 1138 There were no vitals filed for this visit.  Examination:  General exam: Appears calm and comfortable  Respiratory system: Bilateral decreased breath sounds at bases Cardiovascular system: S1 & S2 heard intermittently bradycardic Gastrointestinal system: Abdomen is nondistended, soft and nontender. Normal bowel sounds heard. Extremities: No cyanosis, clubbing, edema  Central nervous system: Alert and oriented. No focal neurological deficits. Moving extremities Skin: No rashes, lesions or ulcers Psychiatry: Judgement and insight appear normal. Mood & affect appropriate.     Data Reviewed: I have personally reviewed following labs and imaging studies  CBC: Recent Labs  Lab 09/23/19 1712 09/24/19 1951 09/25/19 0046  WBC 5.7 7.0 5.9  NEUTROABS  --  4.1  --   HGB 14.0 13.9 13.3  HCT 44.0 44.8 42.1  MCV 95.2 95.7 94.8  PLT 193 239 215   Basic Metabolic Panel: Recent Labs  Lab 09/23/19 1712 09/24/19 1951 09/25/19 0046 09/25/19 0148  NA 140 139  --  141  K 4.1 4.3  --  4.6  CL 106 105  --  104  CO2 23 24  --  28  GLUCOSE 102* 112*  --  171*  BUN 10 14  --  13  CREATININE 0.84 0.89 0.97 0.93  CALCIUM 9.3 9.4  --  9.1  GFR: CrCl cannot be calculated (Unknown ideal weight.). Liver Function Tests: No results for input(s): AST, ALT, ALKPHOS, BILITOT, PROT, ALBUMIN in the last 168 hours. No results for input(s): LIPASE, AMYLASE in the last 168 hours. No results for input(s): AMMONIA in the last 168 hours. Coagulation Profile: No results for input(s): INR, PROTIME in the last 168 hours. Cardiac Enzymes: No results for input(s): CKTOTAL, CKMB, CKMBINDEX, TROPONINI in the last 168 hours. BNP (last 3 results) No results for input(s): PROBNP in the last 8760 hours. HbA1C: No results for input(s): HGBA1C in the last 72 hours. CBG: Recent Labs  Lab 09/25/19 0011 09/25/19 0339 09/25/19 0831  GLUCAP 190* 227* 114*   Lipid Profile: No  results for input(s): CHOL, HDL, LDLCALC, TRIG, CHOLHDL, LDLDIRECT in the last 72 hours. Thyroid Function Tests: No results for input(s): TSH, T4TOTAL, FREET4, T3FREE, THYROIDAB in the last 72 hours. Anemia Panel: No results for input(s): VITAMINB12, FOLATE, FERRITIN, TIBC, IRON, RETICCTPCT in the last 72 hours. Sepsis Labs: No results for input(s): PROCALCITON, LATICACIDVEN in the last 168 hours.  Recent Results (from the past 240 hour(s))  Respiratory Panel by RT PCR (Flu A&B, Covid) - Nasopharyngeal Swab     Status: None   Collection Time: 09/23/19  6:57 PM   Specimen: Nasopharyngeal Swab  Result Value Ref Range Status   SARS Coronavirus 2 by RT PCR NEGATIVE NEGATIVE Final    Comment: (NOTE) SARS-CoV-2 target nucleic acids are NOT DETECTED. The SARS-CoV-2 RNA is generally detectable in upper respiratoy specimens during the acute phase of infection. The lowest concentration of SARS-CoV-2 viral copies this assay can detect is 131 copies/mL. A negative result does not preclude SARS-Cov-2 infection and should not be used as the sole basis for treatment or other patient management decisions. A negative result may occur with  improper specimen collection/handling, submission of specimen other than nasopharyngeal swab, presence of viral mutation(s) within the areas targeted by this assay, and inadequate number of viral copies (<131 copies/mL). A negative result must be combined with clinical observations, patient history, and epidemiological information. The expected result is Negative. Fact Sheet for Patients:  https://www.moore.com/ Fact Sheet for Healthcare Providers:  https://www.young.biz/ This test is not yet ap proved or cleared by the [place] FDA and  has been authorized for detection and/or diagnosis of SARS-CoV-2 by FDA under an Emergency Use Authorization (EUA). This EUA will remain  in effect (meaning this test can be used) for  the duration of the COVID-19 declaration under Section 564(b)(1) of the Act, 21 U.S.C. section 360bbb-3(b)(1), unless the authorization is terminated or revoked sooner.    Influenza A by PCR NEGATIVE NEGATIVE Final   Influenza B by PCR NEGATIVE NEGATIVE Final    Comment: (NOTE) The Xpert Xpress SARS-CoV-2/FLU/RSV assay is intended as an aid in  the diagnosis of influenza from Nasopharyngeal swab specimens and  should not be used as a sole basis for treatment. Nasal washings and  aspirates are unacceptable for Xpert Xpress SARS-CoV-2/FLU/RSV  testing. Fact Sheet for Patients: https://www.moore.com/ Fact Sheet for Healthcare Providers: https://www.young.biz/ This test is not yet approved or cleared by the [place] FDA and  has been authorized for detection and/or diagnosis of SARS-CoV-2 by  FDA under an Emergency Use Authorization (EUA). This EUA will remain  in effect (meaning this test can be used) for the duration of the  Covid-19 declaration under Section 564(b)(1) of the Act, 21  U.S.C. section 360bbb-3(b)(1), unless the authorization is  terminated or revoked. Performed at  [facility], [place] [address], [place], [place] [address]   Respiratory Panel by RT PCR (Flu A&B, Covid) - Nasopharyngeal Swab     Status: None   Collection Time: 09/25/19 12:27 AM   Specimen: Nasopharyngeal Swab  Result Value Ref Range Status   SARS Coronavirus 2 by RT PCR NEGATIVE NEGATIVE Final    Comment: (NOTE) SARS-CoV-2 target nucleic acids are NOT DETECTED. The SARS-CoV-2 RNA is generally detectable in upper respiratoy specimens during the acute phase of infection. The lowest concentration of SARS-CoV-2 viral copies this assay can detect is 131 copies/mL. A negative result does not preclude SARS-Cov-2 infection and should not be used as the sole basis for treatment or other patient management decisions. A negative result may occur with  improper  specimen collection/handling, submission of specimen other than nasopharyngeal swab, presence of viral mutation(s) within the areas targeted by this assay, and inadequate number of viral copies (<131 copies/mL). A negative result must be combined with clinical observations, patient history, and epidemiological information. The expected result is Negative. Fact Sheet for Patients:  PinkCheek.be Fact Sheet for Healthcare Providers:  GravelBags.it This test is not yet ap proved or cleared by the [place] FDA and  has been authorized for detection and/or diagnosis of SARS-CoV-2 by FDA under an Emergency Use Authorization (EUA). This EUA will remain  in effect (meaning this test can be used) for the duration of the COVID-19 declaration under Section 564(b)(1) of the Act, 21 U.S.C. section 360bbb-3(b)(1), unless the authorization is terminated or revoked sooner.    Influenza A by PCR NEGATIVE NEGATIVE Final   Influenza B by PCR NEGATIVE NEGATIVE Final    Comment: (NOTE) The Xpert Xpress SARS-CoV-2/FLU/RSV assay is intended as an aid in  the diagnosis of influenza from Nasopharyngeal swab specimens and  should not be used as a sole basis for treatment. Nasal washings and  aspirates are unacceptable for Xpert Xpress SARS-CoV-2/FLU/RSV  testing. Fact Sheet for Patients: PinkCheek.be Fact Sheet for Healthcare Providers: GravelBags.it This test is not yet approved or cleared by the [place] FDA and  has been authorized for detection and/or diagnosis of SARS-CoV-2 by  FDA under an Emergency Use Authorization (EUA). This EUA will remain  in effect (meaning this test can be used) for the duration of the  Covid-19 declaration under Section 564(b)(1) of the Act, 21  U.S.C. section 360bbb-3(b)(1), unless the authorization is  terminated or revoked. Performed at [facility], [place] [address] [place], [place] [address]          Radiology Studies: DG Chest 2 View  Result Date: 09/24/2019 CLINICAL DATA:  Left chest pain EXAM: CHEST - 2 VIEW COMPARISON:  09/23/2019 FINDINGS: Cardiomegaly status post median sternotomy. Both lungs are clear. The visualized skeletal structures are unremarkable. IMPRESSION: Cardiomegaly without acute abnormality of the lungs. Electronically Signed   By: Eddie Candle M.D.   On: 09/24/2019 14:25   DG Chest 2 View  Result Date: 09/23/2019 CLINICAL DATA:  Chest pain.  Headache EXAM: CHEST - 2 VIEW COMPARISON:  09/22/2019 FINDINGS: Midline sternotomy. Normal mediastinum and cardiac silhouette. No effusion infiltrate pneumothorax. Rounded 9 mm density inferior to the carina suggest a lymph node or vessel. IMPRESSION: No active cardiopulmonary disease. Electronically Signed   By: Suzy Bouchard M.D.   On: 09/23/2019 16:14        Scheduled Meds: . aspirin  81 mg Oral Daily  . atorvastatin  40 mg Oral Daily  . enoxaparin (  LOVENOX) injection  40 mg Subcutaneous Daily  . insulin aspart  0-6 Units Subcutaneous Q4H  . metoprolol tartrate  25 mg Oral BID   Continuous Infusions:        Glade Lloyd, MD [facility] 09/25/2019, 11:38 AM

## 2019-09-25 NOTE — Consult Note (Signed)
Cardiology Consultation:   Patient ID: Alejandro Blair MRN: [id number]; DOB: 05-03-1976  Admit date: 09/24/2019 Date of Consult: 09/25/2019  Primary Care Provider: No primary care provider on file. Primary Cardiologist: New - Patient is travelling from [facility] Primary Electrophysiologist:  None    Patient Profile:   Alejandro Blair is a 44 y.o. 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 Hanley Ben.  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  He is not aware of any repeat cardiac catheterization since the last bypass surgery.  He has been seen frequently for chest pain at multiple facilities.  He thought he was seen in the ED on 06/18/2019 for chest pain.  Full echo report was not available to me, however outside report indicated ejection fraction on echocardiogram performed on 06/10/2019 showed EF 67% without any wall motion abnormality.  Initially, it was recommended for the patient to proceed with stress test, however he apparently had a passing out spell with the previous treadmill stress test and a allergic reaction with a chemical stress test in the past therefore he adamantly refused the stress test.  Cardiac catheterization was discussed with the patient, however he signed out AMA.  There has been report of some drug-seeking behavior.  Patient presented to [facility] ED on 09/23/2019 with chest pain.  Troponin was negative.  Patient was treated with fentanyl and morphine.  He also reported intolerance to nitroglycerin which can cause severe hypotension in him.  Despite his report of previous 3 heart attacks,  however there is no documented prove that he ever had a heart attack.  He returned to [facility] on 09/24/2019 with recurrent chest pain that woke him up from sleep.  Chest pain has waxed and waned however never completely went away.  EKG was negative.  Serial troponin was negative x3.  Chest x-ray was negative.  He was treated with another fentanyl.  Surprisingly even after he was given fentanyl, his urine drug test was negative.    Past Medical History:  Diagnosis Date  . Diabetes mellitus without complication (HCC)   . Myocardial infarct [facility])     Past Surgical History:  Procedure Laterality Date  . CORONARY ANGIOPLASTY WITH STENT PLACEMENT       Home Medications:  Prior to Admission medications   Medication Sig Start Date End Date Taking? Authorizing Provider  aspirin 81 MG chewable tablet Chew 1 tablet by mouth daily.   Yes [provider]  atorvastatin (LIPITOR) 40 MG tablet Take 1 tablet by mouth daily. 09/11/19  Yes [provider]  metoprolol succinate (TOPROL-XL) 50 MG 24 hr tablet Take 1 tablet by mouth daily. 09/11/19  Yes [provider]    Inpatient Medications: Scheduled Meds: . aspirin  81 mg Oral Daily  . atorvastatin  40 mg Oral Daily  . enoxaparin (LOVENOX) injection  40 mg Subcutaneous Daily  . insulin aspart  0-6 Units Subcutaneous Q4H  . metoprolol tartrate  25 mg Oral BID   Continuous Infusions:  PRN Meds: HYDROmorphone (DILAUDID) injection  Allergies:    Allergies  Allergen Reactions  . Coconut  Oil Anaphylaxis  . Codeine Hives  . Ibuprofen Hives and Rash  . Iodinated Diagnostic Agents Hives  . Ketorolac Tromethamine Anaphylaxis and Hives    Tolerates ASA   . Tramadol Anaphylaxis and Hives  . Ketorolac Hives  . Morphine Hives    Denies allergy  . Tolmetin     Social History:   Social History   Socioeconomic History  . Marital status: Single    Spouse name: Not on file  . Number of children: Not on file   . Years of education: Not on file  . Highest education level: Not on file  Occupational History  . Not on file  Tobacco Use  . Smoking status: Current Every Day Smoker  . Smokeless tobacco: Never Used  Substance and Sexual Activity  . Alcohol use: Not Currently  . Drug use: Never  . Sexual activity: Not on file  Other Topics Concern  . Not on file  Social History Narrative  . Not on file   Social Determinants of Health   Financial Resource Strain:   . Difficulty of Paying Living Expenses:   Food Insecurity:   . Worried About Programme researcher, broadcasting/film/video in the Last Year:   . Barista in the Last Year:   Transportation Needs:   . Freight forwarder (Medical):   [name] Lack of Transportation (Non-Medical):   Physical Activity:   . Days of Exercise per Week:   . Minutes of Exercise per Session:   Stress:   . Feeling of Stress :   Social Connections:   . Frequency of Communication with Friends and Family:   . Frequency of Social Gatherings with Friends and Family:   . Attends Religious Services:   . Active Member of Clubs or Organizations:   . Attends Banker Meetings:   [name] Marital Status:   Intimate Partner Violence:   . Fear of Current or Ex-Partner:   . Emotionally Abused:   [name] Physically Abused:   . Sexually Abused:     Family History:    Family History  Problem Relation Age of Onset  . CAD Mother   . Brain cancer Mother   . CAD Father   . Diabetes Mellitus II Father      ROS:  Please see the history of present illness.   All other ROS reviewed and negative.     Physical Exam/Data:   Vitals:   09/25/19 0735 09/25/19 0900 09/25/19 1048 09/25/19 1049  BP: (!) 142/81 138/84 120/64   Pulse: (!) 57  (!) 58 (!) 58  Resp: 15 11 17  (!) 25  Temp:  98.3 F (36.8 C)    TempSrc:  Oral    SpO2: 98% 98% 98% 98%   No intake or output data in the 24 hours ending 09/25/19 1110 No flowsheet data found.   There is no height or weight on file to  calculate BMI.  General:  Well nourished, well developed, in no acute distress HEENT: normal Lymph: no adenopathy Neck: no JVD Endocrine:  No thryomegaly Vascular: No carotid bruits; FA pulses 2+ bilaterally without bruits  Cardiac:  normal S1, S2; RRR; no murmur  Lungs:  clear to auscultation bilaterally, no wheezing, rhonchi or rales  Abd: soft, nontender, no hepatomegaly  Ext: no edema Musculoskeletal:  No deformities, BUE and BLE strength normal and equal Skin: warm and dry  Neuro:  CNs 2-12 intact, no focal abnormalities noted Psych:  Normal affect   EKG:  The EKG was personally reviewed and demonstrates:  NSR without significant ST-T wave changes Telemetry:  Telemetry was personally reviewed and demonstrates:  NSR without significant ventricular ectopy  Relevant CV Studies:  N/A  Laboratory Data:  High Sensitivity Troponin:   Recent Labs  Lab 09/23/19 1712 09/24/19 1959 09/25/19 0046 09/25/19 0148  TROPONINIHS 3 3 4 3      Chemistry Recent Labs  Lab 09/23/19 1712 09/23/19 1712 09/24/19 1951 09/25/19 0046 09/25/19 0148  NA 140  --  139  --  141  K 4.1  --  4.3  --  4.6  CL 106  --  105  --  104  CO2 23  --  24  --  28  GLUCOSE 102*  --  112*  --  171*  BUN 10  --  14  --  13  CREATININE 0.84   < > 0.89 0.97 0.93  CALCIUM 9.3  --  9.4  --  9.1  GFRNONAA >60   < > >60 >60 >60  GFRAA >60   < > >60 >60 >60  ANIONGAP 11  --  10  --  9   < > = values in this interval not displayed.    No results for input(s): PROT, ALBUMIN, AST, ALT, ALKPHOS, BILITOT in the last 168 hours. Hematology Recent Labs  Lab 09/23/19 1712 09/24/19 1951 09/25/19 0046  WBC 5.7 7.0 5.9  RBC 4.62 4.68 4.44  HGB 14.0 13.9 13.3  HCT 44.0 44.8 42.1  MCV 95.2 95.7 94.8  MCH 30.3 29.7 30.0  MCHC 31.8 31.0 31.6  RDW 12.8 12.9 12.7  PLT 193 239 215   BNPNo results for input(s): BNP, PROBNP in the last 168 hours.  DDimer No results for input(s): DDIMER in the last 168  hours.   Radiology/Studies:  DG Chest 2 View  Result Date: 09/24/2019 CLINICAL DATA:  Left chest pain EXAM: CHEST - 2 VIEW COMPARISON:  09/23/2019 FINDINGS: Cardiomegaly status post median sternotomy. Both lungs are clear. The visualized skeletal structures are unremarkable. IMPRESSION: Cardiomegaly without acute abnormality of the lungs. Electronically Signed   By: 11/23/2019 M.D.   On: 09/24/2019 14:25   DG Chest 2 View  Result Date: 09/23/2019 CLINICAL DATA:  Chest pain.  Headache EXAM: CHEST - 2 VIEW COMPARISON:  09/22/2019 FINDINGS: Midline sternotomy. Normal mediastinum and cardiac silhouette. No effusion infiltrate pneumothorax. Rounded 9 mm density inferior to the carina suggest a lymph node or vessel. IMPRESSION: No active cardiopulmonary disease. Electronically Signed   By: 11/22/2019 M.D.   On: 09/23/2019 16:14     Assessment and Plan:   1. Chest pain:  -He described similar chest pain to his previous angina, left-sided chest pain radiating down the left shoulder and left jaw.  Symptom has been ongoing for longer than 24 hours since yesterday morning after he woke up.  Despite so, troponin was negative x3.  -He was seen for similar issue multiple times at different hospital.  During the previous hospitalization in January 2021, echocardiogram was performed that showed normal EF without wall motion abnormality.  Stress test was offered however patient says he had a syncopal event on the previous treadmill stress test and he is allergic to the chemical agent that was given to the chemical stress test. He cannot take nitro due to SE of hypotension with it  - discussed with Dr. 08-10-1971, plan for treadmill myoview, patient is willing to proceed. Cannot completely rule out drug seeking behavior.  2. History of congenital anomalous RCA s/p CABG 2007 with RIMA to RCA, and redo CABG with unknown graft in 2012.  3. Hypertension: on metoprolol at home.  4. Hyperlipidemia: on  lipitor  5. DM2  6. Tobacco abuse  7. History of hepatitis C  8. History of possible drug-seeking behavior: Urine drug test was completely negative despite the fact ED has given him dose of fentanyl 3 hours before and also 2 days ago.      For questions or updates, please contact [place] Please consult [email or web address] for contact info under     Signed, Almyra Deforest, [place]  09/25/2019 11:10 AM

## 2019-09-25 NOTE — ED Notes (Signed)
Pt was requesting to leave. RN paged admitting, they stated to have him sign AMA paperwork and let pt go. RN attempted to get pt to sign AMA papers, pt refused. RN removed IV and pt left.

## 2019-09-25 NOTE — ED Notes (Signed)
Pt is requesting pain medicine, saying that we are all lying to him about his pain medicine schedule. Pt was using inappropriate language with RN, RN explained that that was not going to be tolerated.

## 2019-09-25 NOTE — Discharge Summary (Signed)
Physician Discharge Summary  Alejandro Blair LFY:[id number] DOB: 1975/06/26 DOA: 09/24/2019  PCP: No primary care provider on file.  Admit date: 09/24/2019 Discharge date: 09/25/2019  Admitted From: Home Disposition: Signed out [place]  Discharge Condition: Guarded CODE STATUS: Full  Brief/Interim Summary: 44 year old male with history of CAD status post CABG, diabetes mellitus type 2, ongoing tobacco abuse, presented with chest pain.  EKG and cardiac markers were unremarkable.  Chest x-ray and COVID-19 testing was negative.  Cardiology was consulted.  Patient was supposed to have stress test done today but patient signed out [place].  He was explained the risks of signing out including deterioration of his current condition including death.  Discharge Diagnoses:  Chest pain in a patient with history of CAD status post CABG Diabetes mellitus type 2 with hyperglycemia tobacco abuse  Plan [facility] course as above.  Patient signed out [place].    Allergies  Allergen Reactions  . Coconut Oil Anaphylaxis  . Codeine Hives  . Ibuprofen Hives and Rash  . Iodinated Diagnostic Agents Hives  . Ketorolac Tromethamine Anaphylaxis and Hives    Tolerates ASA   . Tramadol Anaphylaxis and Hives  . Ketorolac Hives  . Morphine Hives    Denies allergy  . Tolmetin     Consultations:  Cardiology   Procedures/Studies: DG Chest 2 View  Result Date: 09/24/2019 CLINICAL DATA:  Left chest pain EXAM: CHEST - 2 VIEW COMPARISON:  09/23/2019 FINDINGS: Cardiomegaly status post median sternotomy. Both lungs are clear. The visualized skeletal structures are unremarkable. IMPRESSION: Cardiomegaly without acute abnormality of the lungs. Electronically Signed   By: Eddie Candle M.D.   On: 09/24/2019 14:25   DG Chest 2 View  Result Date: 09/23/2019 CLINICAL DATA:  Chest pain.  Headache EXAM: CHEST - 2 VIEW COMPARISON:  09/22/2019 FINDINGS: Midline sternotomy.  Normal mediastinum and cardiac silhouette. No effusion infiltrate pneumothorax. Rounded 9 mm density inferior to the carina suggest a lymph node or vessel. IMPRESSION: No active cardiopulmonary disease. Electronically Signed   By: Suzy Bouchard M.D.   On: 09/23/2019 16:14       The results of significant diagnostics from this hospitalization (including imaging, microbiology, ancillary and laboratory) are listed below for reference.     Microbiology: Recent Results (from the past 240 hour(s))  Respiratory Panel by RT PCR (Flu A&B, Covid) - Nasopharyngeal Swab     Status: None   Collection Time: 09/23/19  6:57 PM   Specimen: Nasopharyngeal Swab  Result Value Ref Range Status   SARS Coronavirus 2 by RT PCR NEGATIVE NEGATIVE Final    Comment: (NOTE) SARS-CoV-2 target nucleic acids are NOT DETECTED. The SARS-CoV-2 RNA is generally detectable in upper respiratoy specimens during the acute phase of infection. The lowest concentration of SARS-CoV-2 viral copies this assay can detect is 131 copies/mL. A negative result does not preclude SARS-Cov-2 infection and should not be used as the sole basis for treatment or other patient management decisions. A negative result may occur with  improper specimen collection/handling, submission of specimen other than nasopharyngeal swab, presence of viral mutation(s) within the areas targeted by this assay, and inadequate number of viral copies (<131 copies/mL). A negative result must be combined with clinical observations, patient history, and epidemiological information. The expected result is Negative. Fact Sheet for Patients:  PinkCheek.be Fact Sheet for Healthcare Providers:  GravelBags.it This test is not yet ap proved or cleared by the [place] FDA and  has been authorized for detection  and/or diagnosis of SARS-CoV-2 by FDA under an Emergency Use Authorization (EUA). This EUA will  remain  in effect (meaning this test can be used) for the duration of the COVID-19 declaration under Section 564(b)(1) of the Act, 21 U.S.C. section 360bbb-3(b)(1), unless the authorization is terminated or revoked sooner.    Influenza A by PCR NEGATIVE NEGATIVE Final   Influenza B by PCR NEGATIVE NEGATIVE Final    Comment: (NOTE) The Xpert Xpress SARS-CoV-2/FLU/RSV assay is intended as an aid in  the diagnosis of influenza from Nasopharyngeal swab specimens and  should not be used as a sole basis for treatment. Nasal washings and  aspirates are unacceptable for Xpert Xpress SARS-CoV-2/FLU/RSV  testing. Fact Sheet for Patients: https://www.moore.com/https://www.fda.gov/media/142436/download Fact Sheet for Healthcare Providers: https://www.young.biz/https://www.fda.gov/media/142435/download This test is not yet approved or cleared by the [place] FDA and  has been authorized for detection and/or diagnosis of SARS-CoV-2 by  FDA under an Emergency Use Authorization (EUA). This EUA will remain  in effect (meaning this test can be used) for the duration of the  Covid-19 declaration under Section 564(b)(1) of the Act, 21  U.S.C. section 360bbb-3(b)(1), unless the authorization is  terminated or revoked. Performed at [facility], [address] [place], [place] [address]   Respiratory Panel by RT PCR (Flu A&B, Covid) - Nasopharyngeal Swab     Status: None   Collection Time: 09/25/19 12:27 AM   Specimen: Nasopharyngeal Swab  Result Value Ref Range Status   SARS Coronavirus 2 by RT PCR NEGATIVE NEGATIVE Final    Comment: (NOTE) SARS-CoV-2 target nucleic acids are NOT DETECTED. The SARS-CoV-2 RNA is generally detectable in upper respiratoy specimens during the acute phase of infection. The lowest concentration of SARS-CoV-2 viral copies this assay can detect is 131 copies/mL. A negative result does not preclude SARS-Cov-2 infection and should not be used as the sole basis for treatment or other patient management  decisions. A negative result may occur with  improper specimen collection/handling, submission of specimen other than nasopharyngeal swab, presence of viral mutation(s) within the areas targeted by this assay, and inadequate number of viral copies (<131 copies/mL). A negative result must be combined with clinical observations, patient history, and epidemiological information. The expected result is Negative. Fact Sheet for Patients:  https://www.moore.com/https://www.fda.gov/media/142436/download Fact Sheet for Healthcare Providers:  https://www.young.biz/https://www.fda.gov/media/142435/download This test is not yet ap proved or cleared by the [place] FDA and  has been authorized for detection and/or diagnosis of SARS-CoV-2 by FDA under an Emergency Use Authorization (EUA). This EUA will remain  in effect (meaning this test can be used) for the duration of the COVID-19 declaration under Section 564(b)(1) of the Act, 21 U.S.C. section 360bbb-3(b)(1), unless the authorization is terminated or revoked sooner.    Influenza A by PCR NEGATIVE NEGATIVE Final   Influenza B by PCR NEGATIVE NEGATIVE Final    Comment: (NOTE) The Xpert Xpress SARS-CoV-2/FLU/RSV assay is intended as an aid in  the diagnosis of influenza from Nasopharyngeal swab specimens and  should not be used as a sole basis for treatment. Nasal washings and  aspirates are unacceptable for Xpert Xpress SARS-CoV-2/FLU/RSV  testing. Fact Sheet for Patients: https://www.moore.com/https://www.fda.gov/media/142436/download Fact Sheet for Healthcare Providers: https://www.young.biz/https://www.fda.gov/media/142435/download This test is not yet approved or cleared by the [place] FDA and  has been authorized for detection and/or diagnosis of SARS-CoV-2 by  FDA under an Emergency Use Authorization (EUA). This EUA will remain  in effect (meaning this test can be used) for the duration  of the  Covid-19 declaration under Section 564(b)(1) of the Act, 21  U.S.C. section 360bbb-3(b)(1), unless the authorization  is  terminated or revoked. Performed at [facility], [address], [place], [place] [address]      Labs: BNP (last 3 results) No results for input(s): BNP in the last 8760 hours. Basic Metabolic Panel: Recent Labs  Lab 09/23/19 1712 09/24/19 1951 09/25/19 0046 09/25/19 0148  NA 140 139  --  141  K 4.1 4.3  --  4.6  CL 106 105  --  104  CO2 23 24  --  28  GLUCOSE 102* 112*  --  171*  BUN 10 14  --  13  CREATININE 0.84 0.89 0.97 0.93  CALCIUM 9.3 9.4  --  9.1   Liver Function Tests: No results for input(s): AST, ALT, ALKPHOS, BILITOT, PROT, ALBUMIN in the last 168 hours. No results for input(s): LIPASE, AMYLASE in the last 168 hours. No results for input(s): AMMONIA in the last 168 hours. CBC: Recent Labs  Lab 09/23/19 1712 09/24/19 1951 09/25/19 0046  WBC 5.7 7.0 5.9  NEUTROABS  --  4.1  --   HGB 14.0 13.9 13.3  HCT 44.0 44.8 42.1  MCV 95.2 95.7 94.8  PLT 193 239 215   Cardiac Enzymes: No results for input(s): CKTOTAL, CKMB, CKMBINDEX, TROPONINI in the last 168 hours. BNP: Invalid input(s): POCBNP CBG: Recent Labs  Lab 09/25/19 0011 09/25/19 0339 09/25/19 0831  GLUCAP 190* 227* 114*   D-Dimer No results for input(s): DDIMER in the last 72 hours. Hgb A1c No results for input(s): HGBA1C in the last 72 hours. Lipid Profile No results for input(s): CHOL, HDL, LDLCALC, TRIG, CHOLHDL, LDLDIRECT in the last 72 hours. Thyroid function studies No results for input(s): TSH, T4TOTAL, T3FREE, THYROIDAB in the last 72 hours.  Invalid input(s): FREET3 Anemia work up No results for input(s): VITAMINB12, FOLATE, FERRITIN, TIBC, IRON, RETICCTPCT in the last 72 hours. Urinalysis No results found for: COLORURINE, APPEARANCEUR, LABSPEC, PHURINE, GLUCOSEU, HGBUR, BILIRUBINUR, KETONESUR, PROTEINUR, UROBILINOGEN, NITRITE, LEUKOCYTESUR Sepsis Labs Invalid input(s): PROCALCITONIN,  WBC,  LACTICIDVEN Microbiology Recent Results (from the past 240 hour(s))   Respiratory Panel by RT PCR (Flu A&B, Covid) - Nasopharyngeal Swab     Status: None   Collection Time: 09/23/19  6:57 PM   Specimen: Nasopharyngeal Swab  Result Value Ref Range Status   SARS Coronavirus 2 by RT PCR NEGATIVE NEGATIVE Final    Comment: (NOTE) SARS-CoV-2 target nucleic acids are NOT DETECTED. The SARS-CoV-2 RNA is generally detectable in upper respiratoy specimens during the acute phase of infection. The lowest concentration of SARS-CoV-2 viral copies this assay can detect is 131 copies/mL. A negative result does not preclude SARS-Cov-2 infection and should not be used as the sole basis for treatment or other patient management decisions. A negative result may occur with  improper specimen collection/handling, submission of specimen other than nasopharyngeal swab, presence of viral mutation(s) within the areas targeted by this assay, and inadequate number of viral copies (<131 copies/mL). A negative result must be combined with clinical observations, patient history, and epidemiological information. The expected result is Negative. Fact Sheet for Patients:  https://www.moore.com/ Fact Sheet for Healthcare Providers:  https://www.young.biz/ This test is not yet ap proved or cleared by the [place] FDA and  has been authorized for detection and/or diagnosis of SARS-CoV-2 by FDA under an Emergency Use Authorization (EUA). This EUA will remain  in effect (meaning this test can be  used) for the duration of the COVID-19 declaration under Section 564(b)(1) of the Act, 21 U.S.C. section 360bbb-3(b)(1), unless the authorization is terminated or revoked sooner.    Influenza A by PCR NEGATIVE NEGATIVE Final   Influenza B by PCR NEGATIVE NEGATIVE Final    Comment: (NOTE) The Xpert Xpress SARS-CoV-2/FLU/RSV assay is intended as an aid in  the diagnosis of influenza from Nasopharyngeal swab specimens and  should not be used as a sole  basis for treatment. Nasal washings and  aspirates are unacceptable for Xpert Xpress SARS-CoV-2/FLU/RSV  testing. Fact Sheet for Patients: https://www.moore.com/ Fact Sheet for Healthcare Providers: https://www.young.biz/ This test is not yet approved or cleared by the [place] FDA and  has been authorized for detection and/or diagnosis of SARS-CoV-2 by  FDA under an Emergency Use Authorization (EUA). This EUA will remain  in effect (meaning this test can be used) for the duration of the  Covid-19 declaration under Section 564(b)(1) of the Act, 21  U.S.C. section 360bbb-3(b)(1), unless the authorization is  terminated or revoked. Performed at [facility], [address], [place], [place] [address]   Respiratory Panel by RT PCR (Flu A&B, Covid) - Nasopharyngeal Swab     Status: None   Collection Time: 09/25/19 12:27 AM   Specimen: Nasopharyngeal Swab  Result Value Ref Range Status   SARS Coronavirus 2 by RT PCR NEGATIVE NEGATIVE Final    Comment: (NOTE) SARS-CoV-2 target nucleic acids are NOT DETECTED. The SARS-CoV-2 RNA is generally detectable in upper respiratoy specimens during the acute phase of infection. The lowest concentration of SARS-CoV-2 viral copies this assay can detect is 131 copies/mL. A negative result does not preclude SARS-Cov-2 infection and should not be used as the sole basis for treatment or other patient management decisions. A negative result may occur with  improper specimen collection/handling, submission of specimen other than nasopharyngeal swab, presence of viral mutation(s) within the areas targeted by this assay, and inadequate number of viral copies (<131 copies/mL). A negative result must be combined with clinical observations, patient history, and epidemiological information. The expected result is Negative. Fact Sheet for Patients:  https://www.moore.com/ Fact Sheet for  Healthcare Providers:  https://www.young.biz/ This test is not yet ap proved or cleared by the [place] FDA and  has been authorized for detection and/or diagnosis of SARS-CoV-2 by FDA under an Emergency Use Authorization (EUA). This EUA will remain  in effect (meaning this test can be used) for the duration of the COVID-19 declaration under Section 564(b)(1) of the Act, 21 U.S.C. section 360bbb-3(b)(1), unless the authorization is terminated or revoked sooner.    Influenza A by PCR NEGATIVE NEGATIVE Final   Influenza B by PCR NEGATIVE NEGATIVE Final    Comment: (NOTE) The Xpert Xpress SARS-CoV-2/FLU/RSV assay is intended as an aid in  the diagnosis of influenza from Nasopharyngeal swab specimens and  should not be used as a sole basis for treatment. Nasal washings and  aspirates are unacceptable for Xpert Xpress SARS-CoV-2/FLU/RSV  testing. Fact Sheet for Patients: https://www.moore.com/ Fact Sheet for Healthcare Providers: https://www.young.biz/ This test is not yet approved or cleared by the [place] FDA and  has been authorized for detection and/or diagnosis of SARS-CoV-2 by  FDA under an Emergency Use Authorization (EUA). This EUA will remain  in effect (meaning this test can be used) for the duration of the  Covid-19 declaration under Section 564(b)(1) of the Act, 21  U.S.C. section 360bbb-3(b)(1), unless the authorization is  terminated or revoked.  Performed at [facility], [address], [place], [place] [address]      Time coordinating discharge: 35 minutes  SIGNED:   Glade Lloyd, MD  [facility] 09/25/2019, 2:37 PM

## 2019-09-28 NOTE — Telephone Encounter (Addendum)
Called and left message for Pt.  Call was in regards to the referral Dr Laney Potash placed to [facility].  If Pt returns call, please inform Pt that referral was not accepted as the office only accepts referrals from dentist offices.      Also inquire with Pt to see if he has established with a dentist.      Additionally, please inquire with Pt if he has active insurance and obtain insurance information for files.  Thank you. Beverlee Nims

## 2019-09-30 NOTE — Telephone Encounter (Signed)
Good Morning,  Following up please. Thank you.

## 2019-10-07 NOTE — Telephone Encounter (Signed)
I called pt to make a new pt appointment, LMOM for CB. Prakash Kimberling T MA 10/07/2019 10:15 AM

## 2019-10-15 LAB — PROTHROMBIN TIME CARE EVERYWHERE
INR CARE EVERYWHERE: 0.9 — NL
PROTHROMBIN TIME CARE EVERYWHERE: 10.6 s — NL (ref 9.4–12.5)

## 2019-10-15 LAB — APTT CARE EVERYWHERE: APTT CARE EVERYWHERE: 28 s — NL (ref 25.0–37.0)

## 2019-10-15 NOTE — Telephone Encounter (Signed)
No return call from pt. Will sch if he calls back. Mary T MA 10/15/2019 12:11 PM

## 2019-10-15 NOTE — Telephone Encounter (Signed)
No return call from pt. Will sch if he calls back. Ilo Beamon T MA 10/15/2019 12:11 PM

## 2019-10-30 LAB — CMP (EXT)
ALT/SGPT (EXT): 130 U/L — ABNORMAL HIGH (ref 10–49)
AST/SGOT (EXT)T: 85 IU/L — ABNORMAL HIGH (ref 8–34)
Albumin (EXT): 4.4 g/dL (ref 3.5–5.0)
Alkaline Phosphatase (EXT): 64 U/L (ref 46–116)
Anion Gap (EXT): 5 MMOL/L — ABNORMAL LOW (ref 6–16)
BUN (EXT): 10 mg/dL (ref 5–26)
Bilirubin, Total (EXT): 0.5 mg/dL (ref 0.0–1.2)
CO2 (EXT): 25 MMOL/L (ref 20–31)
CalciumCalcium (EXT): 10.5 mg/dL — ABNORMAL HIGH (ref 8.4–10.2)
Chloride (EXT): 109 MMOL/L (ref 98–110)
Creatinine (EXT): 0.85 mg/dL (ref 0.55–1.30)
Glucose (EXT): 122 mg/dL — ABNORMAL HIGH (ref 70–110)
Potassium (EXT): 3.7 MMOL/L (ref 3.5–5.1)
Protein (EXT): 7.5 g/dL (ref 5.7–8.2)
Sodium (EXT): 139 MMOL/L (ref 136–145)

## 2019-11-05 NOTE — Telephone Encounter (Signed)
Called to speak with Pt to follow up on referral, if Pt has found a dentist, and to confirm Pt's insurance.  Pt refused to speak with me and asked that office not call Pt. No further action at this time - closing note. Thank you. Beverlee Nims

## 2019-11-09 LAB — CMP (EXT)
ALT/SGPT (EXT): 85 U/L — ABNORMAL HIGH (ref 10–49)
AST/SGOT (EXT)T: 48 IU/L — ABNORMAL HIGH (ref 8–34)
Albumin (EXT): 3.8 g/dL (ref 3.5–5.0)
Alkaline Phosphatase (EXT): 58 U/L (ref 46–116)
Anion Gap (EXT): 2 MMOL/L — ABNORMAL LOW (ref 6–16)
BUN (EXT): 10 mg/dL (ref 5–26)
Bilirubin, Total (EXT): 0.3 mg/dL (ref 0.0–1.2)
CO2 (EXT): 28 MMOL/L (ref 20–31)
CalciumCalcium (EXT): 9.5 mg/dL (ref 8.4–10.2)
Chloride (EXT): 111 MMOL/L — ABNORMAL HIGH (ref 98–110)
Creatinine (EXT): 0.96 mg/dL (ref 0.55–1.30)
Glucose (EXT): 198 mg/dL — ABNORMAL HIGH (ref 70–110)
Potassium (EXT): 3.7 MMOL/L (ref 3.5–5.1)
Protein (EXT): 6.6 g/dL (ref 5.7–8.2)
Sodium (EXT): 141 MMOL/L (ref 136–145)

## 2019-11-12 ENCOUNTER — Ambulatory Visit: Admitting: Internal Medicine

## 2019-11-12 LAB — HX CBC W/DIFF
HX ABSOLUTE BASO COUNT AUTODIFF: 0.02 10*3/uL (ref 0.0–0.22)
HX ABSOLUTE EOS COUNT AUTODIFF: 0.09 10*3/uL (ref 0.0–0.45)
HX ABSOLUTE LYMPHS COUNT AUTODIFF: 2.2 10*3/uL (ref 0.74–5.04)
HX ABSOLUTE MONO COUNT AUTODIFF: 0.48 10*3/uL (ref 0.0–1.34)
HX ABSOLUTE NEUTRO COUNT AUTODIFF: 3.68 10*3/uL (ref 1.48–7.95)
HX BASOPHIL AUTOMATED: 0.3 %
HX EOSINOPHIL AUTOMATED: 1.4 %
HX HEMATOCRIT: 40.9 % (ref 39.0–53.0)
HX HEMOGLOBIN: 13.4 g/dL (ref 13.0–17.5)
HX IG AUTOMATED: 0.6 % (ref 0.0–2.0)
HX LYMPHOCYTE AUTOMATED: 33.8 %
HX MEAN CORP.HEMO.CONC.: 32.8 g/dL (ref 31.0–37.0)
HX MEAN CORPUSCULAR HEMOGLOBIN: 30.2 pg (ref 26.0–34.0)
HX MEAN CORPUSCULAR VOLUME: 92.1 fL (ref 80.0–100.0)
HX MEAN PLATELET VOLUME: 10.6 fL (ref 9.4–12.4)
HX MONOCYTE AUTOMATED: 7.4 %
HX NEUTROPHIL AUTOMATED: 56.5 %
HX PLATELET COUNT: 228 10*3/uL (ref 150.0–400.0)
HX RED BLOOD COUNT: 4.44 M/uL (ref 4.2–5.9)
HX RED CELL DISTRIBUTION WIDTH SD: 43.3 fL (ref 35.0–51.0)
HX WHITE BLOOD COUNT: 6.5 10*3/uL (ref 4.0–11.0)

## 2019-11-12 LAB — HX TROPONIN I: HX TROPONIN I: <0.015 (ref 0.0–0.045)

## 2019-11-12 LAB — HX D-DIMER: HX D-DIMER: 0.46 mg{FEU}/L

## 2019-11-12 LAB — HX TOTAL CPK: HX TOTAL CPK: 300 U/L — ABNORMAL HIGH (ref 44.0–196.0)

## 2019-11-12 LAB — HX BRAIN NATIURETIC PEPTIDE: HX BRAIN NATIURETIC PEPTIDE: 47 pg/mL (ref 0.0–450.0)

## 2019-11-12 LAB — HX MAGNESIUM: HX MAGNESIUM: 1.6 mg/dL — ABNORMAL LOW (ref 1.7–2.5)

## 2019-11-13 LAB — HX BEDSIDE METER GLUCOSE TESTING
HX BEDSIDE METER GLUCOSE TESTING: 269 mg/dL — ABNORMAL HIGH (ref 70.0–100.0)
HX BEDSIDE METER GLUCOSE TESTING: 184 mg/dL — ABNORMAL HIGH (ref 70.0–100.0)

## 2019-11-13 LAB — HX COMPREHENSIVE METABOLIC PANEL
HX ALBUMIN: 3.7 g/dL (ref 3.2–5.0)
HX ALKALINE PHOSPHATASE: 64 U/L (ref 30.0–117.0)
HX ALT: 123 U/L — ABNORMAL HIGH (ref 6.0–55.0)
HX ANION GAP: 4 mmol/L (ref 3.0–11.0)
HX AST: 77 U/L — ABNORMAL HIGH (ref 6.0–40.0)
HX BICARBONATE: 30 mmol/L (ref 21.0–32.0)
HX BUN/CREAT RATIO: 10 — ABNORMAL LOW (ref 12.0–20.0)
HX BUN: 9 mg/dL (ref 7.0–23.0)
HX CALCIUM: 9.3 mg/dL (ref 8.5–10.5)
HX CHLORIDE: 105 mmol/L (ref 98.0–110.0)
HX CREATININE: 0.9 mg/dL (ref 0.4–1.3)
HX GLOMERULAR FR AFRICAN AMERICAN: >90
HX GLOMERULAR FR NON AFRICAN AMER: >90
HX GLUCOSE: 142 mg/dL — ABNORMAL HIGH (ref 70.0–110.0)
HX POTASSIUM: 4.2 mmol/L (ref 3.6–5.2)
HX SODIUM: 139 mmol/L (ref 136.0–146.0)
HX TOTAL BILIRUBIN: 0.4 mg/dL (ref 0.2–1.2)
HX TOTAL PROTEIN: 7.7 g/dL (ref 6.0–8.4)

## 2019-11-13 LAB — HX ACUTE HEPATITIS PANEL
HX HEPATITIS A VIRAL ANTIBODY IGM: NONREACTIVE
HX HEPATITIS B CORE ANTIBODY IGM: NONREACTIVE
HX HEPATITIS B SURFACE ANTIGEN: NEGATIVE

## 2019-11-13 LAB — HX GLYCOHEMOGLOBIN
HX ESTIMATED AVERAGE GLUCOSE: 186 mg/dL
HX HEMOGLOBIN A1C: 8.1 % — ABNORMAL HIGH (ref <=5.6)

## 2019-11-13 LAB — HX SARS-COV-2 PCR   GENEXPERT: HX SARS COV 2 RESULT: NEGATIVE

## 2019-11-13 LAB — HX TROPONIN I
HX TROPONIN I: 0.017 ng/mL (ref 0.0–0.045)
HX TROPONIN I: 0.018 ng/mL (ref 0.0–0.045)

## 2019-11-13 LAB — HX TOTAL CPK
HX TOTAL CPK: 236 U/L — ABNORMAL HIGH (ref 44.0–196.0)
HX TOTAL CPK: 213 U/L — ABNORMAL HIGH (ref 44.0–196.0)

## 2019-11-13 LAB — HX SERUM ALCOHOL LEVEL: HX SERUM ALCOHOL LEVEL: <3 (ref 0.0–3.0)

## 2019-11-15 LAB — HX HEPATITIS C VIRAL RNA, QN PCR
HX HCV RNA, PCR, QN: 323000 [IU]/mL — ABNORMAL HIGH
HX HCV RNA, QN REAL-TIME PCR: 5.51 — ABNORMAL HIGH

## 2019-11-18 LAB — COVID-19 CARE EVERYWHERE: COVID-19 CARE EVERYWHERE: NOT DETECTED

## 2019-12-01 NOTE — Telephone Encounter (Signed)
Please schedule ER follow up

## 2019-12-03 ENCOUNTER — Inpatient Hospital Stay
Admit: 2019-12-03 | Discharge: 2019-12-03 | Disposition: A | Discharge status: Home / Self Care | Payer: PRIVATE HEALTH INSURANCE | Attending: Emergency Medicine

## 2019-12-03 DIAGNOSIS — R109 Unspecified abdominal pain: Secondary | ICD-10-CM

## 2019-12-03 LAB — URINE MICROSCOPIC WITH REFLEX CULTURE
BACTERIA, URINE: NEGATIVE /hpf
Bacteria: NEGATIVE /hpf
MUCUS, URINE: NONE SEEN /lpf
Mucus: NONE SEEN /lpf

## 2019-12-03 LAB — URINALYSIS WITH REFLEX TO CULTURE
Bilirubin, Urine: NEGATIVE
Glucose, Ur: 300 mg/dL — AB
Ketones, Urine: NEGATIVE mg/dL
Nitrite, Urine: NEGATIVE
Protein, UA: 30 mg/dL — AB
Specific Gravity, UA: >1.03 NA — ABNORMAL HIGH (ref 1.008–1.030)
Urobilinogen, UA, POCT: 0.2 EU/dL (ref 0.2–1.0)
pH, UA: 6 NA (ref 5.0–8.0)

## 2019-12-03 LAB — UA WITH REFLEX MICRO AND CULTURE
Bilirubin: NEGATIVE
Glucose: 300 mg/dL — AB
Ketone: NEGATIVE mg/dL
Nitrites: NEGATIVE
Protein: 30 mg/dL — AB
Specific gravity: >1.03 — ABNORMAL HIGH (ref 1.008–1.030)
Urobilinogen: 0.2 EU/dL (ref 0.2–1.0)
pH (UA): 6 (ref 5.0–8.0)

## 2019-12-03 MED ORDER — TAMSULOSIN SR 0.4 MG 24 HR CAP
0.4 mg | ORAL_CAPSULE | Freq: Every day | ORAL | 0 refills | Status: AC
Start: 2019-12-03 — End: 2019-12-17

## 2019-12-03 MED ORDER — TAMSULOSIN SR 0.4 MG 24 HR CAP
0.4 mg | Freq: Once | ORAL | Status: AC
Start: 2019-12-03 — End: 2019-12-03
  Administered 2019-12-03: 10:00:00 via ORAL

## 2019-12-03 MED FILL — TAMSULOSIN SR 0.4 MG 24 HR CAP: 0.4 mg | ORAL | Qty: 1

## 2019-12-03 NOTE — ED Provider Notes (Signed)
44 year old male presents emergency department with nontraumatic right flank pain.  Patient reports pain is intermittent over the 1 to 2 days now more constant and more severe over the last 12 hours.  Associated nausea but no vomiting.  No fevers.  He saw all within 1 episode of gross hematuria earlier in the day period no pyuria, dysuria.  He has had urinary urgency frequency.  Patient has been taking over-the-counter acetaminophen for his symptoms.  Patient states pain feels similar to previous episodes of renal colic.  He has never required surgical/GU intervention such as ureteral stent, lithotripsy.  He states last episode approximately 1 month ago in [place].  No rashes.  No diarrhea, constipation, blood in the stool.  No chest pain,           Past Medical History:   Diagnosis Date   ??? CAD (coronary artery disease)     MI x 3   ??? Cardiac revascularization with aortocoronary bypass anastomosis    ??? Chronic dental pain     - multiple teeth remove   ??? Chronic knee pain    ??? Hypertension    ??? Myocardial infarct [facility])    ??? S/P CABG x 1 2007    CABG x 1, 2007, due to congenital heart disease--patient's explanation is that right coronary artery was never in the proper location (perfused left side of heart only) and he had repeat cabage surgery in 2012 to correct this.   ??? Thromboembolus [facility])        Past Surgical History:   Procedure Laterality Date   ??? HX CHOLECYSTECTOMY  08/2010        ??? HX CORONARY ARTERY BYPASS GRAFT  2007, 2012    2007: due to congenital heart disease - patients explanation is that right coronary artery was never in the proper location (perfused left side of heart only) and had repeat CABG surgery in 2012 to correct this   ??? HX OTHER SURGICAL  11/30/2013    TOOTH EXTRACTION         Family History:   Problem Relation Age of Onset   ??? Heart Disease Mother    ??? Cancer Mother         BRAIN CANCER   ??? Diabetes Father    ??? Diabetes Brother    ??? Heart Disease Maternal Grandmother    ??? Dementia  Maternal Grandmother    ??? Heart Disease Maternal Grandfather    ??? Heart Disease Paternal Grandmother    ??? Heart Disease Paternal Grandfather    ??? No Known Problems Other    ??? Heart Disease Brother        Social History     Socioeconomic History   ??? Marital status: SINGLE     Spouse name: Not on file   ??? Number of children: 0   ??? Years of education: Not on file   ??? Highest education level: Not on file   Occupational History     Comment: DISABILITY   Tobacco Use   ??? Smoking status: Current Some Day Smoker     Packs/day: 0.25     Years: 30.00     Pack years: 7.50     Types: Cigarettes   ??? Smokeless tobacco: Former Neurosurgeon   ??? Tobacco comment: About 40-50 pack years, used to smoke 2 PPD but now down to 2 cigarettes a day   Substance and Sexual Activity   ??? Alcohol use: No   ??? Drug use:  No   ??? Sexual activity: Not on file   Other Topics Concern   ??? Military Service Not Asked   ??? Blood Transfusions Not Asked   ??? Caffeine Concern Not Asked   ??? Occupational Exposure Not Asked   ??? Hobby Hazards Not Asked   ??? Sleep Concern Not Asked   ??? Stress Concern Not Asked   ??? Weight Concern Not Asked   ??? Special Diet Not Asked   ??? Back Care Not Asked   ??? Exercise Not Asked   ??? Bike Helmet Not Asked   ??? Seat Belt Not Asked   ??? Self-Exams Not Asked   Social History Narrative    He is from [place] but moved to [place] at age 36.  Has been between [place] and [address]    Lives with wife, married since 2016     No children    Disability secondary to heart disease and syncopal episode    About 40-50 pack years, used to smoke 2 PPD but now down to 2 cigarettes a day    No EtOH    No drug use     Social Determinants of Psychologist, prison and probation services Strain:    ??? Difficulty of Paying Living Expenses:    Food Insecurity:    ??? Worried About Programme researcher, broadcasting/film/video in the Last Year:    ??? Barista in the Last Year:    Transportation Needs:    ??? Freight forwarder (Medical):    ??? Lack of Transportation (Non-Medical):    Physical Activity:    ???  Days of Exercise per Week:    ??? Minutes of Exercise per Session:    Stress:    ??? Feeling of Stress :    Social Connections:    ??? Frequency of Communication with Friends and Family:    ??? Frequency of Social Gatherings with Friends and Family:    ??? Attends Religious Services:    ??? Database administrator or Organizations:    ??? Attends Engineer, structural:    ??? Marital Status:    Intimate Programme researcher, broadcasting/film/video Violence:    ??? Fear of Current or Ex-Partner:    ??? Emotionally Abused:    ??? Physically Abused:    ??? Sexually Abused:          ALLERGIES: Coconut, Codeine, Morphine, Motrin [ibuprofen], Toradol [ketorolac], Tramadol, Iodinated contrast media, and Nitroglycerin    Review of Systems   Constitutional: Negative for fever.   Neurological: Negative for syncope.       Vitals:    12/03/19 0239   BP: (!) 127/92   Pulse: (!) 101   Resp: 18   Temp: 98.6 ??F (37 ??C)   SpO2: 98%            Physical Exam  Vitals and nursing note reviewed.   Constitutional:       Appearance: He is obese. He is not ill-appearing, toxic-appearing or diaphoretic.      Comments: Patient resting on the stretcher on his smart phone, no respiratory distress.   HENT:      Head: Normocephalic and atraumatic.   Pulmonary:      Effort: Pulmonary effort is normal.   Abdominal:      General: Abdomen is flat. There is no distension.      Palpations: There is no mass.      Tenderness: There is right CVA tenderness. There is no left  CVA tenderness, guarding or rebound.      Hernia: No hernia is present.   Musculoskeletal:         General: Normal range of motion.      Comments: Mild to moderate tenderness right upper lumbar region.  No bony deformities or tenderness over the spine.   Skin:     General: Skin is warm and dry.      Capillary Refill: Capillary refill takes less than 2 seconds.      Findings: No rash (No rash over the affected area).   Neurological:      General: No focal deficit present.      Mental Status: He is alert and oriented to person, place, and  time.          MDM  Number of Diagnoses or Management Options  Acute right flank pain  Diagnosis management comments: 44 year old male presents to the ED complaining of nontraumatic right flank pain.  He states this feels similar to episodes of kidney stones that he has had the past.  On my evaluation he reports pain though objectively does not appear to be in severe distress.  There is no clinical evidence of dehydration.  Will obtain urinalysis.  If shows evidence of infection then would obtain advanced imaging to evaluate for potential hydronephrosis.    There is no evidence of shingles.  This pain is somewhat lower than expected for typical renal colic in the may be a component of myofascial strain.  He is no anterior abdominal pain that be concerning for intra-abdominal process such as appendicitis, bowel obstruction.  No right upper quadrant pain consistent with choledocholithiasis, pancreatitis.  Status post remote cholecystectomy.       Amount and/or Complexity of Data Reviewed  Clinical lab tests: ordered and reviewed  Decide to obtain previous medical records or to obtain history from someone other than the patient: yes  Review and summarize past medical records: yes      ED Course as of Dec 03 515   Thu Dec 03, 2019   0512 I reviewed the urinalysis, my interpretation with the patient.  Presentation is consistent with renal colic no evidence of infection.  No nausea, vomiting.  No evidence dehydration.  Believe the patient can be managed as an outpatient.  Agrees to follow-up with Urology office if not improving next week.  Agrees with recommended plan of Flomax once daily until pain resolves, kidney stone passes.  Patient will return to the emergency department if he develops a fever, vomiting, uncontrolled pain.  Patient will use over-the-counter acetaminophen to help with symptoms.    [WC]      ED Course User Index  [WC] Thomasena Edis Wynelle Beckmann, MD       Procedures      NIH Stroke Scale

## 2019-12-03 NOTE — ED Notes (Signed)
Pt co right lower flank pain.   States that he had bloody urine early this am.   States he had a previous kidney stone on month ago.   States pain is similar.

## 2019-12-07 ENCOUNTER — Emergency Department: Payer: PRIVATE HEALTH INSURANCE | Primary: Family Medicine

## 2019-12-07 DIAGNOSIS — I25119 Atherosclerotic heart disease of native coronary artery with unspecified angina pectoris: Secondary | ICD-10-CM

## 2019-12-07 NOTE — ED Provider Notes (Signed)
HPI this is a 44 year old gentleman with an extensive cardiac history, he has had CABG x1 vessel twice, last time in 2012. His last MI was in 2016. He had not had chest pain for quite some time until about 2 months ago.  He has been having mild chest pain off and on, but had not caused him to go in to be seen for it yet.  He lives in [place], here for a family event.  He reports that tonight about 20-30 minutes prior to arrival he was woken from sleep with 9/10 left upper chest pain, sharp and stabbing, radiates to his left arm in his left neck.  He feels very lightheaded with it, some nausea, some palpitations, no shortness of breath.  He reports his pain is identical to his previous MI pain.  He denies any history to aspirin although he is allergic to other NSAIDs.  Morphine was listed as an allergy but he denies this.  He reports that he cannot take nitroglycerin because it causes syncope.    Denies any cough or cold symptoms, no fevers or chills, he did get his COVID vaccines.  He still smoking 2 cigarettes a day, denies any alcohol or drug use.      Past Medical History:   Diagnosis Date   ??? CAD (coronary artery disease)     MI x 3   ??? Cardiac revascularization with aortocoronary bypass anastomosis    ??? Chronic dental pain     - multiple teeth remove   ??? Chronic knee pain    ??? Hypertension    ??? Myocardial infarct [facility])    ??? S/P CABG x 1 2007    CABG x 1, 2007, due to congenital heart disease--patient's explanation is that right coronary artery was never in the proper location (perfused left side of heart only) and he had repeat cabage surgery in 2012 to correct this.   ??? Thromboembolus [facility])        Past Surgical History:   Procedure Laterality Date   ??? HX CHOLECYSTECTOMY  08/2010        ??? HX CORONARY ARTERY BYPASS GRAFT  2007, 2012    2007: due to congenital heart disease - patients explanation is that right coronary artery was never in the proper location (perfused left side of heart only) and had repeat CABG  surgery in 2012 to correct this   ??? HX OTHER SURGICAL  11/30/2013    TOOTH EXTRACTION         Family History:   Problem Relation Age of Onset   ??? Heart Disease Mother    ??? Cancer Mother         BRAIN CANCER   ??? Diabetes Father    ??? Diabetes Brother    ??? Heart Disease Maternal Grandmother    ??? Dementia Maternal Grandmother    ??? Heart Disease Maternal Grandfather    ??? Heart Disease Paternal Grandmother    ??? Heart Disease Paternal Grandfather    ??? No Known Problems Other    ??? Heart Disease Brother        Social History     Socioeconomic History   ??? Marital status: SINGLE     Spouse name: Not on file   ??? Number of children: 0   ??? Years of education: Not on file   ??? Highest education level: Not on file   Occupational History     Comment: DISABILITY   Tobacco Use   ??? Smoking  status: Current Some Day Smoker     Packs/day: 0.25     Years: 30.00     Pack years: 7.50     Types: Cigarettes   ??? Smokeless tobacco: Former Neurosurgeon   ??? Tobacco comment: About 40-50 pack years, used to smoke 2 PPD but now down to 2 cigarettes a day   Substance and Sexual Activity   ??? Alcohol use: No   ??? Drug use: No   ??? Sexual activity: Not on file   Other Topics Concern   ??? Military Service Not Asked   ??? Blood Transfusions Not Asked   ??? Caffeine Concern Not Asked   ??? Occupational Exposure Not Asked   ??? Hobby Hazards Not Asked   ??? Sleep Concern Not Asked   ??? Stress Concern Not Asked   ??? Weight Concern Not Asked   ??? Special Diet Not Asked   ??? Back Care Not Asked   ??? Exercise Not Asked   ??? Bike Helmet Not Asked   ??? Seat Belt Not Asked   ??? Self-Exams Not Asked   Social History Narrative    He is from [place] but moved to [place] at age 48.  Has been between [place] and [address]    Lives with wife, married since 2016     No children    Disability secondary to heart disease and syncopal episode    About 40-50 pack years, used to smoke 2 PPD but now down to 2 cigarettes a day    No EtOH    No drug use     Social Determinants of Statistician Strain:    ??? Difficulty of Paying Living Expenses:    Food Insecurity:    ??? Worried About Programme researcher, broadcasting/film/video in the Last Year:    ??? Barista in the Last Year:    Transportation Needs:    ??? Freight forwarder (Medical):    ??? Lack of Transportation (Non-Medical):    Physical Activity:    ??? Days of Exercise per Week:    ??? Minutes of Exercise per Session:    Stress:    ??? Feeling of Stress :    Social Connections:    ??? Frequency of Communication with Friends and Family:    ??? Frequency of Social Gatherings with Friends and Family:    ??? Attends Religious Services:    ??? Database administrator or Organizations:    ??? Attends Engineer, structural:    ??? Marital Status:    Intimate Programme researcher, broadcasting/film/video Violence:    ??? Fear of Current or Ex-Partner:    ??? Emotionally Abused:    ??? Physically Abused:    ??? Sexually Abused:          ALLERGIES: Coconut, Codeine, Morphine, Motrin [ibuprofen], Toradol [ketorolac], Tramadol, Iodinated contrast media, and Nitroglycerin    Review of Systems   Constitutional: Negative.         Please see HPI for details of review of systems.  Otherwise all review of systems negative.   HENT: Negative.    Respiratory: Negative.    Cardiovascular: Positive for chest pain and palpitations. Negative for leg swelling.   Gastrointestinal: Positive for nausea. Negative for vomiting.   Genitourinary: Negative.        Vitals:    12/08/19 0100 12/08/19 0115 12/08/19 0130 12/08/19 0145   BP: (!) 136/113 (!) 139/99  (!) 147/59   Pulse:  66 71 74 79   Resp: 11 (!) 31 (!) 55 (!) 33   Temp:       SpO2:                Physical Exam  Vitals and nursing note reviewed.   Constitutional:       General: He is not in acute distress.     Appearance: He is well-developed and normal weight. He is not ill-appearing, toxic-appearing or diaphoretic.   HENT:      Head: Normocephalic and atraumatic.   Eyes:      Pupils: Pupils are equal, round, and reactive to light.   Neck:      Vascular: No JVD.   Cardiovascular:       Rate and Rhythm: Normal rate and regular rhythm.   Pulmonary:      Effort: Pulmonary effort is normal.      Breath sounds: Normal breath sounds.   Abdominal:      General: Bowel sounds are normal.      Palpations: Abdomen is soft.      Tenderness: There is no abdominal tenderness. There is no guarding or rebound.   Musculoskeletal:      Cervical back: Neck supple.      Right lower leg: No edema.      Left lower leg: No edema.   Lymphadenopathy:      Cervical: No cervical adenopathy.   Neurological:      General: No focal deficit present.      Mental Status: He is alert and oriented to person, place, and time.   Psychiatric:         Mood and Affect: Mood normal.          MDM  Number of Diagnoses or Management Options  Diagnosis management comments: Patient with extremely extensive cardiac history at his young age, has had a recent pattern of increasing frequency of chest pain, tonight is the worst his pain is been, pain woken from sleep, so at rest, and identical to previous MI pain.  That being said, initial EKG at least is quite reassuring.  It is normal sinus rhythm at 66, with no acute appearing ST or T changes.  He is not allergic to aspirin, giving him 4 baby aspirin, he states he cannot take nitroglycerin so he is getting morphine for his pain.  Checking a chest x-ray, serial troponins, magnesium, CBC, CMP, and will re-evaluate frequently.       Amount and/or Complexity of Data Reviewed  Clinical lab tests: ordered and reviewed  Tests in the radiology section of CPT??: ordered and reviewed  Independent visualization of images, tracings, or specimens: yes      Labs Reviewed   CBC WITH AUTOMATED DIFF - Abnormal; Notable for the following components:       Result Value    HGB 13.5 (*)     HCT 41.1 (*)     All other components within normal limits   METABOLIC PANEL, COMPREHENSIVE - Abnormal; Notable for the following components:    Glucose 233 (*)     ALT (SGPT) 91 (*)     AST (SGOT) 47 (*)     All other components  within normal limits   MAGNESIUM - Abnormal; Notable for the following components:    Magnesium 1.6 (*)     All other components within normal limits   COVID-19,INFLUENZA A/B,RSV PANEL   TROPONIN I   TROPONIN I   GLUCOSE, POC  Imaging Results:  No results found.    Medications given in the ED:  Medications   sodium chloride (NS) flush 5-10 mL (has no administration in time range)   sodium chloride (NS) flush 5-10 mL (has no administration in time range)   aspirin chewable tablet 324 mg (324 mg Oral Given 12/07/19 2358)   morphine injection 4 mg (4 mg IntraVENous Given 12/08/19 0005)   morphine injection 4 mg (4 mg IntraVENous Given 12/08/19 0056)   magnesium sulfate 1 g/100 ml IVPB (premix or compounded) (1 g IntraVENous New Bag 12/08/19 0057)   morphine injection 4 mg (4 mg IntraVENous Given 12/08/19 0150)       ED Course as of Dec 08 250   Tue Dec 08, 2019   0054 Patient's 1st troponin was negative, after the 1st 4 of morphine, his pain went down to a 7/10, still uncomfortable, getting more morphine now.  I am replacing his magnesium.  Will discuss with Cardiology once/if we get his pain under control regarding whether he can be admitted here, with they want to start heparin drip given his history, and/or further recommendations.    [SP]   0113 Patient has just received a 2nd dose of morphine, he reports the pain is continuing to improve.  His going on for chest x-ray, will re-evaluate on return.    [SP]   0149 Patient's chest x-ray on my read is NAD, a he has a cardiac clamp in the middle of his sternum, he reports that is because his sternotomy wires broke between his 1st and 2nd surgeries.  He did not have an infection.  He tells me the pain is continuing to improve, down to a 5/10, tells me he does not want to be transferred to [facility].  He is currently awake and alert, blood pressure is stable, I am giving him 1 more dose of 4 mg of morphine intravenous.  Also rechecking an EKG.  Again his initial EKG  was completely normal, 1st troponin was negative, will discuss the patient with Cardiology after the next dose of morphine and evaluating his response to that.    [SP]   0220 His repeat EKG was unchanged.  After the 3rd dose of morphine, he now is pain-free.  I put in a call to the cardiologist to discuss if he can be admitted here to our hospital given reassuring EKG and negative 1st troponin, and whether he recommends a heparin drip or not.    [SP]   [id number] I spoke to Dr. Adron Beneeron of Cardiology regarding patient's extensive cardiac history and current presentation, with concerning story, but normal EKG x2, and negative 1st troponin.  His recommendation was to contact [facility] to present to Cardiology over there to see if they would want the patient there since he is questioning whether a stress test would be appropriate after a normal admission for rule out here given his extensive cardiac history.  Patient certainly does not need an emergent catheterization at this time, but again question of whether they feel he would be more appropriate over there.  Patient initially was highly reticent to in [facility] transfer, currently states he is willing to let me call to see what they say.  If the patient is admitted here to our hospital, Dr. Adron Beneeron felt that he did not need to be on a heparin drip unless his EKG changed, or troponin bumped.  I placed a call to [facility], waiting for call back.    [  SP]   0251 I spoke to Dr. Gilmer Mor of Cardiology at [facility].  There hospitals currently full, he thinks is a reasonable plan to admit the patient here, if troponins are negative, he thinks is reasonable to do a stress test instead of a primary catheterization, and last patient flops of positive troponin, or has new EKG changes.  If either of those things happen, he recommends that patient get put on the list for transfer once a bed is available for a catheterization.  I have contacted the hospitalist for  admission.    [SP]      ED Course User Index  [SP] Festus Holts Suezanne Jacquet, MD       Procedures    IMPRESSION:  1. Chest pain, unspecified type    2. Coronary artery disease involving native heart with unstable angina pectoris, unspecified vessel or lesion type [facility])        DISPOSITION:    Admitted    Please note that portions of this document were created using the M*Modal Fluency Direct dictation system.  Any inconsistencies or typographical errors may be the result of mis-transcription that persist in spite of proof-reading and should be addressed with the document creator.

## 2019-12-07 NOTE — ED Notes (Signed)
 Pt coming to ed tonight w/c/o a lot of chest pain, it woke me up from sleep. Pt has had 3 heart attacks and 2 bypasses. Pt cannot take nitro, it drops his BP too quickly. Pt states the pain is in his left chest radiating down left arm and into jaw.

## 2019-12-08 ENCOUNTER — Emergency Department: Admit: 2019-12-08 | Payer: PRIVATE HEALTH INSURANCE | Primary: Family Medicine

## 2019-12-08 ENCOUNTER — Inpatient Hospital Stay
Admit: 2019-12-08 | Discharge: 2019-12-08 | Discharge status: Against Medical Advice | Payer: MEDICAID | Attending: Family Medicine

## 2019-12-08 LAB — HEMOGLOBIN A1C CARE EVERYWHERE
ESTIMATED AVERAGE GLUCOSE CARE EVERYWHERE: 226 mg/dL
HEMOGLOBIN A1C CARE EVERYWHERE: 9.5 % — ABNORMAL HIGH (ref 4.0–6.0)

## 2019-12-08 LAB — RESPIRATORY PANEL BASIC CARE EVERYWHERE
COVID-19 CARE EVERYWHERE: NEGATIVE
INFLUENZA A PCR CARE EVERYWHERE: NEGATIVE
INFLUENZA B PCR CARE EVERYWHERE: NEGATIVE
RSV PCR CARE EVERYWHERE: NEGATIVE

## 2019-12-08 LAB — EKG 12-LEAD
Atrial Rate: 65 ms
Atrial Rate: 66 ms
ECG QTC Interval: 355 ms
ECG QTC Interval: 385 ms
EKG I-40 FRONT AXIS: -1 deg
EKG I-40 FRONT AXIS: 27 deg
EKG I-40 HORIZONTAL AXIS: 58 deg
EKG I-40 HORIZONTAL AXIS: 79 deg
EKG P DURATION: 123 ms
EKG P DURATION: 139 ms
EKG P FRONT AXIS: 13 deg
EKG P FRONT AXIS: 42 deg
EKG P HORIZONTAL AXIS: -9 deg
EKG P HORIZONTAL AXIS: 4 deg
EKG Q ONSET: 499 ms
EKG Q ONSET: 504 ms
EKG QRS AXIS: 51 deg
EKG QRS AXIS: 53 deg
EKG QRS HORIZONTAL AXIS: -36 deg
EKG QRS HORIZONTAL AXIS: -38 deg
EKG QRSD INTERVAL: 102 ms
EKG QRSD INTERVAL: 99 ms
EKG QTCB: 372 ms
EKG QTCB: 404 ms
EKG QTCF: 366 ms
EKG QTCF: 397 ms
EKG RR INTERVAL: 909 ms
EKG RR INTERVAL: 909 ms
EKG S-T FRONT AXIS: 46 deg
EKG S-T FRONT AXIS: 65 deg
EKG S-T HORIZONTAL AXIS: 48 deg
EKG S-T HORIZONTAL AXIS: 65 deg
EKG T HORIZONTAL AXIS: 29 deg
EKG T HORIZONTAL AXIS: 36 deg
EKG T WAVE AXIS: 54 deg
EKG T WAVE AXIS: 62 deg
EKG T-40 FRONT AXIS: 100 deg
EKG T-40 FRONT AXIS: 81 deg
EKG T-40 HORIZONTAL AXIS: -51 deg
EKG T-40 HORIZONTAL AXIS: -58 deg
Heart Rate: 66 {beats}/min
Heart Rate: 66 {beats}/min
P-R Interval: 168 ms
P-R Interval: 190 ms

## 2019-12-08 LAB — POCT GLUCOSE: POC Glucose: 224 mg/dL — ABNORMAL HIGH (ref 75–110)

## 2019-12-08 LAB — COMPREHENSIVE METABOLIC PANEL
ALT: 91 U/L — ABNORMAL HIGH (ref 3–35)
AST: 47 U/L — ABNORMAL HIGH (ref 15–40)
Albumin/Globulin Ratio: 1.2
Albumin: 3.9 g/dL (ref 3.5–5.0)
Alkaline Phosphatase: 57 U/L (ref 35–100)
Anion Gap: 14 mmol/L
BUN: 8 MG/DL (ref 7–20)
Bun/Cre Ratio: 10 NA
CO2: 26 mmol/L (ref 20–32)
Calcium: 9 MG/DL (ref 8.8–10.5)
Chloride: 100 mmol/L (ref 100–110)
Creatinine: 0.78 MG/DL (ref 0.40–1.20)
EGFR IF NonAfrican American: >60 mL/min/{1.73_m2} (ref >60)
GFR African American: >60 mL/min/{1.73_m2} (ref >60)
Globulin: 3.3 g/dL
Glucose: 233 mg/dL — ABNORMAL HIGH (ref 75–110)
Potassium: 3.9 mmol/L (ref 3.5–5.0)
Sodium: 136 mmol/L (ref 135–145)
Total Bilirubin: 0.4 mg/dL (ref 0.10–1.20)
Total Protein: 7.2 g/dL (ref 6.2–8.0)

## 2019-12-08 LAB — CBC WITH AUTO DIFFERENTIAL
Basophils %: 1 %
Basophils Absolute: 0 10*3/uL (ref 0.0–0.2)
Eosinophils %: 2 %
Eosinophils Absolute: 0.1 10*3/uL (ref 0.0–0.5)
Granulocyte Absolute Count: 0 10*3/uL (ref 0.0–0.1)
Hematocrit: 41.1 % — ABNORMAL LOW (ref 42.0–52.0)
Hemoglobin: 13.5 g/dL — ABNORMAL LOW (ref 14.0–18.0)
Immature Granulocytes: 0 %
Lymphocytes %: 39 %
Lymphocytes Absolute: 2 10*3/uL (ref 1.0–4.5)
MCH: 29.9 PG (ref 28.0–34.0)
MCHC: 32.8 g/dL (ref 32.0–36.0)
MCV: 91.1 FL (ref 80.0–100.0)
MPV: 10.3 FL (ref 7.0–12.0)
Monocytes %: 7 %
Monocytes Absolute: 0.4 10*3/uL (ref 0.1–0.8)
NRBC Absolute: 0 10*3/uL
Neutrophils %: 51 %
Neutrophils Absolute: 2.6 10*3/uL (ref 1.9–7.8)
Nucleated RBCs: 0 PER 100 WBC
Platelets: 201 10*3/uL (ref 150–400)
RBC: 4.51 M/uL (ref 4.50–6.00)
RDW: 12.4 % (ref 11.5–13.5)
WBC: 5.1 10*3/uL (ref 4.8–10.8)

## 2019-12-08 LAB — TROPONIN
Troponin I: <0.04 ng/mL (ref <0.040)
Troponin I: <0.04 ng/mL (ref <0.040)

## 2019-12-08 LAB — COVID-19 & INFLUENZA COMBO
RSV By PCR: NEGATIVE
Rapid Influenza A By PCR: NEGATIVE
Rapid Influenza B By PCR: NEGATIVE
SARS-CoV-2: NEGATIVE

## 2019-12-08 LAB — MAGNESIUM
Magnesium: 1.6 mg/dL — ABNORMAL LOW (ref 1.7–2.5)
Magnesium: 1.6 mg/dL — ABNORMAL LOW (ref 1.7–2.5)

## 2019-12-08 LAB — EKG, 12 LEAD, INITIAL
Atrial Rate: 65 ms
Atrial Rate: 66 ms
Heart Rate: 66 {beats}/min
Heart Rate: 66 {beats}/min
I-40 Front Axis: -1 deg
I-40 Front Axis: 27 deg
I-40 Horizontal Axis: 58 deg
I-40 Horizontal Axis: 79 deg
P Duration: 123 ms
P Duration: 139 ms
P Front Axis: 13 deg
P Front Axis: 42 deg
P Horizontal Axis: -9 deg
P Horizontal Axis: 4 deg
P-R Interval: 168 ms
P-R Interval: 190 ms
Q Onset: 499 ms
Q Onset: 504 ms
QRS Axis: 51 deg
QRS Axis: 53 deg
QRS Horizontal Axis: -36 deg
QRS Horizontal Axis: -38 deg
QRSD Interval: 102 ms
QRSD Interval: 99 ms
QT Interval: 355 ms
QT Interval: 385 ms
QTcB: 372 ms
QTcB: 404 ms
QTcF: 366 ms
QTcF: 397 ms
RR Interval: 909 ms
RR Interval: 909 ms
S-T Front Axis: 46 deg
S-T Front Axis: 65 deg
S-T Horizontal Axis: 48 deg
S-T Horizontal Axis: 65 deg
T Horizontal Axis: 29 deg
T Horizontal Axis: 36 deg
T Wave Axis: 54 deg
T Wave Axis: 62 deg
T-40 Front Axis: 100 deg
T-40 Front Axis: 81 deg
T-40 Horizontal Axis: -51 deg
T-40 Horizontal Axis: -58 deg

## 2019-12-08 LAB — CBC WITH AUTOMATED DIFF
ABS. BASOPHILS: 0 10*3/uL (ref 0.0–0.2)
ABS. EOSINOPHILS: 0.1 10*3/uL (ref 0.0–0.5)
ABS. IMM. GRANS.: 0 10*3/uL (ref 0.0–0.1)
ABS. LYMPHOCYTES: 2 10*3/uL (ref 1.0–4.5)
ABS. MONOCYTES: 0.4 10*3/uL (ref 0.1–0.8)
ABS. NEUTROPHILS: 2.6 10*3/uL (ref 1.9–7.8)
ABSOLUTE NRBC: 0 10*3/uL
BASOPHILS: 1 %
EOSINOPHILS: 2 %
HCT: 41.1 % — ABNORMAL LOW (ref 42.0–52.0)
HGB: 13.5 g/dL — ABNORMAL LOW (ref 14.0–18.0)
IMMATURE GRANULOCYTES: 0 %
LYMPHOCYTES: 39 %
MCH: 29.9 PG (ref 28.0–34.0)
MCHC: 32.8 g/dL (ref 32.0–36.0)
MCV: 91.1 FL (ref 80.0–100.0)
MONOCYTES: 7 %
MPV: 10.3 FL (ref 7.0–12.0)
NEUTROPHILS: 51 %
NRBC: 0 PER 100 WBC
PLATELET: 201 10*3/uL (ref 150–400)
RBC: 4.51 M/uL (ref 4.50–6.00)
RDW: 12.4 % (ref 11.5–13.5)
WBC: 5.1 10*3/uL (ref 4.8–10.8)

## 2019-12-08 LAB — METABOLIC PANEL, COMPREHENSIVE
A-G Ratio: 1.2
ALT (SGPT): 91 U/L — ABNORMAL HIGH (ref 3–35)
AST (SGOT): 47 U/L — ABNORMAL HIGH (ref 15–40)
Albumin: 3.9 g/dL (ref 3.5–5.0)
Alk. phosphatase: 57 U/L (ref 35–100)
Anion gap: 14 mmol/L
BUN/Creatinine ratio: 10
BUN: 8 MG/DL (ref 7–20)
Bilirubin, total: 0.4 mg/dL (ref 0.10–1.20)
CO2: 26 mmol/L (ref 20–32)
Calcium: 9 MG/DL (ref 8.8–10.5)
Chloride: 100 mmol/L (ref 100–110)
Creatinine: 0.78 MG/DL (ref 0.40–1.20)
GFR est AA: >60 mL/min/{1.73_m2} (ref >60)
GFR est non-AA: >60 mL/min/{1.73_m2} (ref >60)
Globulin: 3.3 g/dL
Glucose: 233 mg/dL — ABNORMAL HIGH (ref 75–110)
Potassium: 3.9 mmol/L (ref 3.5–5.0)
Protein, total: 7.2 g/dL (ref 6.2–8.0)
Sodium: 136 mmol/L (ref 135–145)

## 2019-12-08 LAB — COVID-19,INFLUENZA A/B,RSV PANEL
Influenza A by PCR: NEGATIVE
Influenza B by PCR: NEGATIVE
RSV by PCR: NEGATIVE
SARS-CoV-2 by PCR: NEGATIVE

## 2019-12-08 LAB — TROPONIN I
Troponin-I, Qt.: <0.04 ng/mL (ref <0.040)
Troponin-I, Qt.: <0.04 ng/mL (ref <0.040)

## 2019-12-08 LAB — GLUCOSE, POC: Glucose (POC): 224 mg/dL — ABNORMAL HIGH (ref 75–110)

## 2019-12-08 MED ORDER — IBUPROFEN 400 MG TAB
400 mg | ORAL | Status: DC | PRN
Start: 2019-12-08 — End: 2019-12-09

## 2019-12-08 MED ORDER — ATORVASTATIN 40 MG TAB
40 mg | Freq: Every evening | ORAL | Status: DC
Start: 2019-12-08 — End: 2019-12-09
  Administered 2019-12-09: 01:00:00 via ORAL

## 2019-12-08 MED ORDER — MORPHINE 2 MG/ML INJECTION
2 mg/mL | Freq: Once | INTRAMUSCULAR | Status: AC
Start: 2019-12-08 — End: 2019-12-08
  Administered 2019-12-08: 04:00:00 via INTRAVENOUS

## 2019-12-08 MED ORDER — SODIUM CHLORIDE 0.9 % IJ SYRG
INTRAMUSCULAR | Status: DC | PRN
Start: 2019-12-08 — End: 2019-12-09

## 2019-12-08 MED ORDER — DEXTROSE 10% IN WATER (D10W) IV
10 % | INTRAVENOUS | Status: DC | PRN
Start: 2019-12-08 — End: 2019-12-09

## 2019-12-08 MED ORDER — GLUCOSE 4 GRAM CHEWABLE TAB
4 gram | ORAL | Status: DC | PRN
Start: 2019-12-08 — End: 2019-12-09

## 2019-12-08 MED ORDER — SODIUM CHLORIDE 0.9 % IJ SYRG
Freq: Two times a day (BID) | INTRAMUSCULAR | Status: DC
Start: 2019-12-08 — End: 2019-12-09
  Administered 2019-12-08 – 2019-12-09 (×2): via INTRAVENOUS

## 2019-12-08 MED ORDER — MAGNESIUM SULFATE IN D5W 1 GRAM/100 ML IV PIGGY BACK
1 gram/00 mL | INTRAVENOUS | Status: AC
Start: 2019-12-08 — End: 2019-12-08
  Administered 2019-12-08: 05:00:00 via INTRAVENOUS

## 2019-12-08 MED ORDER — MORPHINE 2 MG/ML INJECTION
2 mg/mL | Freq: Once | INTRAMUSCULAR | Status: AC
Start: 2019-12-08 — End: 2019-12-08
  Administered 2019-12-08: 06:00:00 via INTRAVENOUS

## 2019-12-08 MED ORDER — ENOXAPARIN 40 MG/0.4 ML SUB-Q SYRINGE
40 mg/0.4 mL | SUBCUTANEOUS | Status: DC
Start: 2019-12-08 — End: 2019-12-09
  Administered 2019-12-09: 01:00:00 via SUBCUTANEOUS

## 2019-12-08 MED ORDER — INSULIN ASPART 100 UNIT/ML (3 ML) SUB-Q PEN
100 unit/mL (3 mL) | Freq: Four times a day (QID) | SUBCUTANEOUS | Status: DC
Start: 2019-12-08 — End: 2019-12-09
  Administered 2019-12-09: 02:00:00 via SUBCUTANEOUS

## 2019-12-08 MED ORDER — TAMSULOSIN SR 0.4 MG 24 HR CAP
0.4 mg | Freq: Every day | ORAL | Status: DC
Start: 2019-12-08 — End: 2019-12-09

## 2019-12-08 MED ORDER — ASPIRIN 81 MG CHEWABLE TAB
81 mg | ORAL | Status: AC
Start: 2019-12-08 — End: 2019-12-07
  Administered 2019-12-08: 04:00:00 via ORAL

## 2019-12-08 MED ORDER — MORPHINE 2 MG/ML INJECTION
2 mg/mL | INTRAMUSCULAR | Status: DC | PRN
Start: 2019-12-08 — End: 2019-12-08
  Administered 2019-12-08 (×2): via INTRAVENOUS

## 2019-12-08 MED ORDER — MORPHINE 2 MG/ML INJECTION
2 mg/mL | Freq: Three times a day (TID) | INTRAMUSCULAR | Status: DC | PRN
Start: 2019-12-08 — End: 2019-12-09
  Administered 2019-12-09 (×2): via INTRAVENOUS

## 2019-12-08 MED ORDER — DEXTROSE 40 % ORAL GEL
40 % | ORAL | Status: DC | PRN
Start: 2019-12-08 — End: 2019-12-09

## 2019-12-08 MED ORDER — MORPHINE 2 MG/ML INJECTION
2 mg/mL | Freq: Once | INTRAMUSCULAR | Status: AC
Start: 2019-12-08 — End: 2019-12-08
  Administered 2019-12-08: 05:00:00 via INTRAVENOUS

## 2019-12-08 MED ORDER — GLUCAGON 1 MG INJECTION
1 mg | INTRAMUSCULAR | Status: DC | PRN
Start: 2019-12-08 — End: 2019-12-09

## 2019-12-08 MED ORDER — SODIUM CHLORIDE 0.9 % IJ SYRG
Freq: Three times a day (TID) | INTRAMUSCULAR | Status: DC
Start: 2019-12-08 — End: 2019-12-09
  Administered 2019-12-08 – 2019-12-09 (×2): via INTRAVENOUS

## 2019-12-08 MED ORDER — METOPROLOL SUCCINATE SR 25 MG 24 HR TAB
25 mg | Freq: Every day | ORAL | Status: DC
Start: 2019-12-08 — End: 2019-12-09
  Administered 2019-12-08: 12:00:00 via ORAL

## 2019-12-08 MED ORDER — ASPIRIN 81 MG CHEWABLE TAB
81 mg | Freq: Every day | ORAL | Status: DC
Start: 2019-12-08 — End: 2019-12-09
  Administered 2019-12-08: 12:00:00 via ORAL

## 2019-12-08 MED ORDER — HYDROCODONE-ACETAMINOPHEN 5 MG-325 MG TAB
5-325 mg | ORAL | Status: DC | PRN
Start: 2019-12-08 — End: 2019-12-09
  Administered 2019-12-08 – 2019-12-09 (×3): via ORAL

## 2019-12-08 MED ORDER — NALOXONE 0.4 MG/ML INJECTION
0.4 mg/mL | INTRAMUSCULAR | Status: DC | PRN
Start: 2019-12-08 — End: 2019-12-09

## 2019-12-08 MED FILL — BD POSIFLUSH NORMAL SALINE 0.9 % INJECTION SYRINGE: INTRAMUSCULAR | Qty: 10

## 2019-12-08 MED FILL — TAMSULOSIN SR 0.4 MG 24 HR CAP: 0.4 mg | ORAL | Qty: 1

## 2019-12-08 MED FILL — MORPHINE 2 MG/ML INJECTION: 2 mg/mL | INTRAMUSCULAR | Qty: 2

## 2019-12-08 MED FILL — MORPHINE 2 MG/ML INJECTION: 2 mg/mL | INTRAMUSCULAR | Qty: 1

## 2019-12-08 MED FILL — ASPIRIN 81 MG CHEWABLE TAB: 81 mg | ORAL | Qty: 4

## 2019-12-08 MED FILL — HYDROCODONE-ACETAMINOPHEN 5 MG-325 MG TAB: 5-325 mg | ORAL | Qty: 1

## 2019-12-08 MED FILL — METOPROLOL SUCCINATE SR 25 MG 24 HR TAB: 25 mg | ORAL | Qty: 2

## 2019-12-08 MED FILL — MAGNESIUM SULFATE IN D5W 1 GRAM/100 ML IV PIGGY BACK: 1 gram/00 mL | INTRAVENOUS | Qty: 100

## 2019-12-08 NOTE — Progress Notes (Signed)
 1333:Pt awake arrived to the unit at this time. Pt c/o chest pain, see pain assessment. Pt's BP 136/82, pulse 53  And O2 sat 90% on RA. Pt states he does not have allergy to morphine and  Talked to the Dr to removed it. Dr Americo made aware and states it is ok to medicate with morphine per [facility]. No other new orders received.  1400: Pt continues c/o chest pain, states is better  5/10 states pain is not tolerable and  he wants something else while he is waiting for next morphine dose. This Clinical research associate offer ibuprofen he refused. Pt's home medications reviewed and some changes made. Pt states he has diabetes and that he takes metoprolol 25 mg 2x at day.  When this writer came back with Norco  at 1354 hrs found Pt resting on bed with eyes closed, respiration regular and even  snoring sounds noted  See pain assessment.  Dr Americo made aware.  1530: C.McNally states to hold metoprolol today and tomorrow morning in preparation for stress  test.  1800: Pt  c/o 7/10 sharp left side chest pain , BP 137/84, pulse 52, O2 sat 95%  On RA  Pt asking for morphine  This writer inform Pt about the change on morphine frequency. Pt agree to take Norco  at this time but is requesting to talk with MD. Dr Americo made aware. Dr Americo unable to come to talk with Pt at 1820 hrs. DR pollyann made aware and states to wait to see if norco  helps before he can come and talk with Pt. Pt made aware and agree to wait at this time. No other new orders received at this time.

## 2019-12-08 NOTE — H&P (Signed)
H&P by Wynne Dust,  DO at 12/08/19 0734                Author: Wynne Dust, DO  Service: Hospitalist  Author Type: Physician       Filed: 12/08/19 0801  Date of Service: 12/08/19 0734  Status: Signed          Editor: Wynne Dust, DO (Physician)                                  History and Physical        Subjective:        Dustin Carney is a 44 y.o.  African American male with PMHx significant for CAD s/p CABG, HTN, MI, Diabetes but not on any meds now who presents with Chest pain associated  with palpitations. Onset of symptoms was sudden with gradually improving course since that time after receiving Morphine for pain. The pain is located across the anterior chest wall.  He reports that tonight around 12 midnight about 20-30 minutes prior  to arrival he was woken from sleep with 9/10 left upper chest pain, sharp and stabbing, radiates to his left arm in his left neck.  He feels very lightheaded with it, some nausea, palpitations, with no shortness of breath.  He reports his pain is identical  to his previous MI pain.  He denies any history of allergy to aspirin although he is allergic to other NSAIDs.  Morphine was listed as an allergy but he denies this as well.  He reports that he cannot take nitroglycerin because it causes syncope. He  denied fever, chills, headache, double vision, cough, diaphoresis.    He has had CABG x1 vessel twice, last time in 2012. His last MI was in 2016. He had not had chest pain for quite some time until about 2 months ago.  He has  been having mild chest pain off and on, but had not caused him to go in to be seen for it yet.  He lives in [place], here for a family event. He has not seen a cardiologist since 2016.            Past Medical History:        Diagnosis  Date         ?  CAD (coronary artery disease)            MI x 3         ?  Cardiac revascularization with aortocoronary bypass anastomosis       ?  Chronic dental pain            - multiple teeth  remove         ?  Chronic knee pain       ?  Hypertension       ?  Myocardial infarct [facility])       ?  S/P CABG x 1  2007          CABG x 1, 2007, due to congenital heart disease--patient's explanation is that right coronary artery was never in the proper location (perfused left  side of heart only) and he had repeat cabage surgery in 2012 to correct this.         ?  Thromboembolus [facility])             Past Surgical History:  Procedure  Laterality  Date          ?  HX CHOLECYSTECTOMY    08/2010                     ?  HX CORONARY ARTERY BYPASS GRAFT    2007, 2012          2007: due to congenital heart disease - patients explanation is that right coronary artery was never in the proper location (perfused left side of  heart only) and had repeat CABG surgery in 2012 to correct this          ?  HX OTHER SURGICAL    11/30/2013          TOOTH EXTRACTION          Family History         Problem  Relation  Age of Onset          ?  Heart Disease  Mother       ?  Cancer  Mother                BRAIN CANCER          ?  Diabetes  Father       ?  Diabetes  Brother       ?  Heart Disease  Maternal Grandmother       ?  Dementia  Maternal Grandmother       ?  Heart Disease  Maternal Grandfather       ?  Heart Disease  Paternal Grandmother       ?  Heart Disease  Paternal Grandfather       ?  No Known Problems  Other            ?  Heart Disease  Brother             Social History          Tobacco Use         ?  Smoking status:  Current Some Day Smoker              Packs/day:  0.25         Years:  30.00         Pack years:  7.50         Types:  Cigarettes         ?  Smokeless tobacco:  Former Systems developer        ?  Tobacco comment: About 40-50 pack years, used to smoke 2 PPD but now down to 2 cigarettes a day       Substance Use Topics         ?  Alcohol use:  No            Prior to Admission medications             Medication  Sig  Start Date  End Date  Taking?  Authorizing Provider            tamsulosin (Flomax) 0.4 mg capsule  Take 1  Capsule by mouth daily for 14 days. Stop medication if kidney stone passes, no flank pain.  Indications: stones in the urinary tract  12/03/19  12/17/19    Annie Sable, MD     metFORMIN (GLUCOPHAGE) 500 mg tablet  Take 1 Tab by mouth two (2) times daily (with meals). Indications: type 2 diabetes mellitus  09/11/19  Jake Samples, MD     metoprolol succinate (TOPROL-XL) 50 mg XL tablet  Take 1 Tab by mouth daily.  09/11/19      Jake Samples, MD     atorvastatin (LIPITOR) 40 mg tablet  Take 1 Tab by mouth nightly. Indications: high cholesterol and high triglycerides  09/11/19      Jake Samples, MD     naloxone [facility]) 4 mg/actuation nasal spray  Use 1 spray intranasally, then discard. Repeat with new spray every 2 min as needed for opioid overdose symptoms, alternating nostrils.  Indications:  decrease in rate & depth of breathing due to opioid drug, opioid overdose  09/11/19      Jake Samples, MD     aluminum-magnesium hydroxide 200-200 mg/5 mL susp 5 mL, diphenhydrAMINE 12.5 mg/5 mL liqd 12.5 mg, lidocaine 2 % soln 5 mL  5 mL by Swish and Spit route two (2) times a day. Magic mouth wash    Maalox   Lidocaine 2% viscous    Diphenhydramine oral solution       Pharmacy to mix equal portions of ingredients to a total volume as indicated in the dispense amount.  03/03/19      Messier, Stephan Minister, PA            aspirin 81 mg chewable tablet  Take 324 mg by mouth daily. Usually only takes '162mg'$         Provider, Historical          Allergies        Allergen  Reactions         ?  Coconut  Anaphylaxis     ?  Codeine  Other (comments)             Denies allergy         ?  Morphine  Hives             Denies allergy         ?  Motrin [Ibuprofen]  Hives     ?  Toradol [Ketorolac]  Hives     ?  Tramadol  Hives     ?  Iodinated Contrast Media  Swelling         ?  Nitroglycerin  Hives            Review of Systems:   A comprehensive review of systems was negative except for that written in the History of Present Illness.         Constitutional: Negative.         HENT: Negative.     Respiratory: Negative.     Cardiovascular: Positive for chest pain and palpitations . Negative for leg swelling.    Gastrointestinal: Positive for nausea. Negative for vomiting.    Genitourinary: Negative.          Objective:        Intake and Output:     No intake/output data recorded.   07/18 1901 - 07/20 0700   In: 100 [I.V.:100]   Out: -       Physical Exam:      Vitals:           ??  12/08/19 0100  12/08/19 0115  12/08/19 0130  12/08/19 0145           BP:  (!) 136/113  (!) 139/99  ??  (!) 147/59     Pulse:  66  71  74  79  Resp:  11  (!) 31  (!) 55  (!) 33     Temp:  ??  ??  ??  ??     SpO2:  ??  ??  ??  ??             ??   Physical Exam   Vitals and nursing note reviewed.   Constitutional:        General: He is not in acute distress.     Appearance: He is well-developed and normal weight. He is not ill-appearing, toxic-appearing or diaphoretic.   HENT :       Head: Normocephalic and atraumatic.   Eyes:       Pupils: Pupils are equal, round, and reactive to light.   Neck:       Vascular: No JVD.   Cardiovascular:       Rate and Rhythm: Normal rate and regular rhythm.   Pulmonary:       Effort: Pulmonary effort is normal.      Breath sounds: Normal breath sounds.    Abdominal:      General: Bowel sounds are normal.      Palpations: Abdomen is soft.      Tenderness: There is no abdominal tenderness. There is no guarding or rebound.    Musculoskeletal:      Cervical back: Neck supple.      Right lower leg: No edema.      Left lower leg: No edema.    Lymphadenopathy:       Cervical: No cervical adenopathy.   Neurological:       General: No focal deficit present.      Mental Status: He is alert and oriented to person, place, and time.   Psychiatric :         Mood and Affect: Mood normal.          ECG:  normal EKG, normal sinus rhythm, unchanged from previous tracings       Data Review:      Recent Results (from the past 24 hour(s))     EKG, 12 LEAD, INITIAL           Collection Time: 12/07/19 11:33 PM         Result  Value  Ref Range            Heart Rate  66  bpm       RR Interval  909  ms       Atrial Rate  66  ms       P-R Interval  190  ms       P Duration  139  ms       P Horizontal Axis  4  deg       P Front Axis  13  deg       Q Onset  504  ms       QRSD Interval  99  ms       QT Interval  355  ms       QTcB  372  ms       QTcF  366  ms       QRS Horizontal Axis  -38  deg       QRS Axis  53  deg       I-40 Horizontal Axis  58  deg       I-40 Front Axis  27  deg  T-40 Horizontal Axis  -58  deg       T-40 Front Axis  100  deg       T Horizontal Axis  29  deg       T Wave Axis  54  deg       S-T Horizontal Axis  48  deg       S-T Front Axis  46  deg       CBC WITH AUTOMATED DIFF          Collection Time: 12/08/19 12:02 AM         Result  Value  Ref Range            WBC  5.1  4.8 - 10.8 K/uL       RBC  4.51  4.50 - 6.00 M/uL       HGB  13.5 (L)  14.0 - 18.0 g/dL       HCT  41.1 (L)  42.0 - 52.0 %       MCV  91.1  80.0 - 100.0 FL       MCH  29.9  28.0 - 34.0 PG       MCHC  32.8  32.0 - 36.0 g/dL       RDW  12.4  11.5 - 13.5 %       PLATELET  201  150 - 400 K/uL       MPV  10.3  7.0 - 12.0 FL       NEUTROPHILS  51  %       LYMPHOCYTES  39  %       MONOCYTES  7  %       EOSINOPHILS  2  %       BASOPHILS  1  %       IMMATURE GRANULOCYTES  0  %       ABS. NEUTROPHILS  2.6  1.9 - 7.8 K/UL       ABS. LYMPHOCYTES  2.0  1.0 - 4.5 K/UL       ABS. MONOCYTES  0.4  0.1 - 0.8 K/UL       ABS. EOSINOPHILS  0.1  0.0 - 0.5 K/UL       ABS. BASOPHILS  0.0  0.0 - 0.2 K/UL       ABS. IMM. GRANS.  0.0  0.0 - 0.1 K/UL       NRBC  0.0  PER 100 WBC       ABSOLUTE NRBC  0.00  K/uL       DF  AUTOMATED         METABOLIC PANEL, COMPREHENSIVE          Collection Time: 12/08/19 12:02 AM         Result  Value  Ref Range            Sodium  136  135 - 145 mmol/L       Potassium  3.9  3.5 - 5.0 mmol/L       Chloride  100  100 - 110 mmol/L       CO2  26  20 - 32 mmol/L       Anion gap  14  mmol/L        Glucose  233 (H)  75 - 110 mg/dL       BUN  8  7 - 20 MG/DL       Creatinine  0.78  0.40 - 1.20 MG/DL       BUN/Creatinine ratio  10         GFR est AA  >60  >60 ml/min/1.72m       GFR est non-AA  >60  >60 ml/min/1.720m      Calcium  9.0  8.8 - 10.5 MG/DL       Bilirubin, total  0.40  0.10 - 1.20 mg/dL       ALT (SGPT)  91 (H)  3 - 35 U/L       AST (SGOT)  47 (H)  15 - 40 U/L       Alk. phosphatase  57  35 - 100 U/L       Protein, total  7.2  6.2 - 8.0 g/dL       Albumin  3.9  3.5 - 5.0 g/dL       Globulin  3.3  g/dL       A-G Ratio  1.2          TROPONIN I          Collection Time: 12/08/19 12:02 AM         Result  Value  Ref Range            Troponin-I, Qt.  <0.040  <0.040 ng/mL       MAGNESIUM          Collection Time: 12/08/19 12:02 AM         Result  Value  Ref Range            Magnesium  1.6 (L)  1.7 - 2.5 mg/dL       GLUCOSE, POC          Collection Time: 12/08/19 12:15 AM         Result  Value  Ref Range            Glucose (POC)  224 (H)  75 - 110 mg/dL       Performed by  LaBerna Spare       EKG, 12 LEAD, INITIAL          Collection Time: 12/08/19  1:52 AM         Result  Value  Ref Range            Heart Rate  66  bpm       RR Interval  909  ms       Atrial Rate  65  ms       P-R Interval  168  ms       P Duration  123  ms       P Horizontal Axis  -9  deg       P Front Axis  42  deg       Q Onset  499  ms       QRSD Interval  102  ms       QT Interval  385  ms       QTcB  404  ms       QTcF  397  ms       QRS Horizontal Axis  -36  deg       QRS Axis  51  deg       I-40 Horizontal Axis  79  deg       I-40 Front Axis  -1  deg       T-40 Horizontal Axis  -  51  deg       T-40 Front Axis  81  deg       T Horizontal Axis  36  deg       T Wave Axis  62  deg       S-T Horizontal Axis  65  deg       S-T Front Axis  65  deg       TROPONIN I          Collection Time: 12/08/19  2:11 AM         Result  Value  Ref Range            Troponin-I, Qt.  <0.040  <0.040 ng/mL       COVID-19,INFLUENZA A/B,RSV PANEL           Collection Time: 12/08/19  2:30 AM         Result  Value  Ref Range            Specimen source  NP SWAB         SARS-CoV-2  Negative  Negative       Influenza A by PCR  Negative  Negative       Influenza B by PCR  Negative  Negative            RSV by PCR  Negative  Negative           Chest x-ray .No results found.        Assessment:        Principal Problem:     Chest pain due to CAD ([place]) (12/08/2019)      Active Problems:     Hyperlipidemia (09/25/2013)        Atherosclerosis of coronary artery (06/26/2016)       Overview: CORONARY ARTERY DISEASE        Obesity (09/25/2013)        Essential hypertension (03/22/2017)        Smoking (06/19/2019)              Plan:     Chest pain   History of CABG and MI.    Admit to medicine -Telemetry monitor   Cardiac enzymes: Initial troponin unremarkable   Patient cannot take Nitro due to syncope   Responded well to morphine   C/w Morphine for chest pain   Resume home meds with holding parameters   Echocardiogram for cardiac functions   Cardiology consult.   Will defer need for cath to cardiology   FU labs      CAD/HLD   C/w statin, aspirin      HTN;   C/w home meds with holding parameters      Obesity/smoking   Discussed weight loss program, diet, exercise and smoking cessation            Signed By:  Wynne Dust, DO        December 08, 2019

## 2019-12-08 NOTE — ED Notes (Signed)
Report given to [place], Charity fundraiser. Patient to be transferred to 5th floor with all belongings.

## 2019-12-09 LAB — POCT GLUCOSE: POC Glucose: 371 mg/dL — ABNORMAL HIGH (ref 75–110)

## 2019-12-09 LAB — HEMOGLOBIN A1C W/EAG
Hemoglobin A1C: 9.5 % — ABNORMAL HIGH (ref 4.0–6.0)
eAG: 226 mg/dL

## 2019-12-09 LAB — HEMOGLOBIN A1C WITH EAG
Est. average glucose: 226 mg/dL
Hemoglobin A1c: 9.5 % — ABNORMAL HIGH (ref 4.0–6.0)

## 2019-12-09 LAB — GLUCOSE, POC: Glucose (POC): 371 mg/dL — ABNORMAL HIGH (ref 75–110)

## 2019-12-09 MED FILL — ENOXAPARIN 40 MG/0.4 ML SUB-Q SYRINGE: 40 mg/0.4 mL | SUBCUTANEOUS | Qty: 0.4

## 2019-12-09 MED FILL — MORPHINE 2 MG/ML INJECTION: 2 mg/mL | INTRAMUSCULAR | Qty: 1

## 2019-12-09 MED FILL — HYDROCODONE-ACETAMINOPHEN 5 MG-325 MG TAB: 5-325 mg | ORAL | Qty: 1

## 2019-12-09 MED FILL — ATORVASTATIN 40 MG TAB: 40 mg | ORAL | Qty: 1

## 2019-12-09 MED FILL — NOVOLOG FLEXPEN U-100 INSULIN ASPART 100 UNIT/ML (3 ML) SUBCUTANEOUS: 100 unit/mL (3 mL) | SUBCUTANEOUS | Qty: 3

## 2019-12-09 NOTE — Discharge Summary (Signed)
Discharge Summary by Sudie Bailey, MD at 12/09/19 0540                Author: Sudie Bailey, MD  Service: Internal Medicine  Author Type: Physician       Filed: 12/09/19 0755  Date of Service: 12/09/19 0540  Status: Signed          Editor: Sudie Bailey, MD (Physician)                                   Physician Discharge Summary               Patient: Dustin Carney  MRN: [id number]   SSN: [id number]          Date of Birth: 1976-03-05   Age: 44 y.o.   Sex: male      PCP: None      Admit date: 12/07/2019   Admitting Provider: Derenda Mis, DO      Discharge date: 12/09/2019   Discharging Provider: Sudie Bailey, MD      * Admission Diagnoses: Chest pain due to CAD [facility]) [I25.119]      * Discharge Diagnoses:        Hospital Problems  as of 12/09/2019  Date Reviewed:  09/20/2019                     Codes  Class  Noted - Resolved  POA              * (Principal) Chest pain due to CAD [facility])  ICD-10-CM: I25.119   ICD-9-CM: 786.50, 414.00    12/08/2019 - Present  Unknown                        Smoking  ICD-10-CM: F17.200   ICD-9-CM: 305.1    06/19/2019 - Present  Yes                        Coronary artery disease involving native coronary artery of native heart with unstable angina pectoris [facility])  ICD-10-CM: I25.110   ICD-9-CM: 414.01, 411.1    07/30/2017 - Present            Overview Signed 09-20-19  5:40 PM by Heide Guile, MD            Formatting of this note might be different from the original.   Per patient he has had 3 MIs and 2 cardiac surgeries   Taking BB and lipitor  But does not have-- will need refills   Also taking ASA   Has a Cardiologist at [facility]      Needs labs done         Last Assessment & Plan:    Formatting of this note might be different from the original.                                    Essential hypertension  ICD-10-CM: I10   ICD-9-CM: 401.9    03/22/2017 - Present  Yes                        Atherosclerosis of coronary artery  ICD-10-CM: I25.10   ICD-9-CM:  414.00    06/26/2016 - Present  Yes          Overview Addendum 12/04/2016  1:34 PM by Rosanne Gutting            CORONARY ARTERY DISEASE                                    Hyperlipidemia  ICD-10-CM: E78.5   ICD-9-CM: 272.4    09/25/2013 - Present  Yes                        Obesity  ICD-10-CM: E66.9   ICD-9-CM: 278.00    09/25/2013 - Present  Yes                       Presentation/ED Course:   Dustin Carney is a 44 y.o. African American male with PMHx significant for CAD s/p CABG, HTN, MI, Diabetes but not on any meds now who presents with Chest pain associated  with palpitations. Onset of symptoms was sudden with gradually improving course since that time after receiving Morphine for pain. The pain is located across the anterior chest wall. ??He reports that tonight around 12 midnight about 20-30 minutes  prior to arrival he was woken from sleep with 9/10 left upper chest pain, sharp and stabbing, radiates to his left arm in his left neck. ??He feels very lightheaded with it, some nausea, palpitations, with no shortness of breath. ??He reports his  pain is identical to his previous MI pain. ??He denies any history of allergy to aspirin although he is allergic to other NSAIDs. ??Morphine was listed as an allergy but he denies this as well. ??He reports that he cannot take nitroglycerin  because it causes syncope. He denied fever, chills, headache, double vision, cough, diaphoresis.    He has had CABG x1 vessel twice, last time in 2012. His last MI was in 2016. He had not had chest pain for quite some time until about 2 months ago. ??He has been having mild chest pain off and on, but had not caused him to go in to be seen for it yet.  ??He lives in [place], here for a family event. He has not seen a cardiologist since 2016.       [facility] Course:    Patient was admitted to the medical/tele floor for continuation of care. His hospital was uneventful for any acute events however notable for constant request for IV Morphine.        His was scheduled for NST this am however patient refused to stay for the procedure because he wasn't getting his IV Morphine as he desired.       Attempts at having him stay in the hospital for further cardiac work up was unsuccessful. He left the hospital AMA without signing the AMA documents despite being told the consequences of his decision by the nursing staff as documented per chart.         Allergies        Allergen  Reactions         ?  Coconut  Anaphylaxis     ?  Codeine  Other (comments)             Denies allergy         ?  Morphine  Hives             Denies allergy         ?  Motrin [Ibuprofen]  Hives     ?  Toradol [Ketorolac]  Hives     ?  Tramadol  Hives     ?  Iodinated Contrast Media  Swelling         ?  Nitroglycerin  Hives        Last Code status: Full Code      Immunization:     Immunization History        Administered  Date(s) Administered         ?  Influenza Vaccine  04/20/2017         ?  Influenza Vaccine [employer]) PF (>6 Mo Flulaval, Fluarix, and >3 Yrs Shona [employer]les [address])  08/15/2012        * Procedures:    * No surgery found *      Consults: None      Discharge Exam:   Visit Vitals      BP  120/64 (BP 1 Location: Left upper arm, BP Patient Position: At rest)     Pulse  60     Temp  98.5 ??F (36.9 ??C)     Resp  20     Ht  6\' 4"  (1.93 m)     Wt  128.9 kg (284 lb 1.6 oz)     SpO2  96%        BMI  34.58 kg/m??        O2 Device:  None (Room air)Temp (24hrs), Avg:98 ??F (36.7 ??C), Min:97.4 ??F (36.3 ??C), Max:98.5 ??F (36.9 ??C)      Significant Diagnostic Studies:      Recent Results (from the past 24 hour(s))     GLUCOSE, POC          Collection Time: 12/08/19  8:54 PM         Result  Value  Ref Range            Glucose (POC)  371 (H)  75 - 110 mg/dL            Performed by  12/10/19          XR CHEST PA LAT      Result Date: 12/08/2019   No acute pulmonary process.  Moderate cardiomegaly.  Postsurgical changes to the mediastinum as above.         All Micro Results               Procedure   Component  Value  Units  Date/Time           COVID-19,INFLUENZA A/B,RSV PANEL 12/10/2019  Collected: 12/08/19 0230            Order Status: Completed  Specimen: Nasopharyngeal from Nasopharynx  Updated: 12/08/19 0341              Specimen source  NP SWAB         SARS-CoV-2  Negative             Comment:  Negative results do not preclude SARS-CoV-2 infection and should not be used as the sole basis for patient management decisions.   This test has been authorized by the FDA under an Emergency Use Authorization (EUA) for use by authorized laboratories.    Methodology: RT-PCR                           Influenza A by PCR  Negative  Influenza B by PCR  Negative              RSV by PCR  Negative             * Discharge Condition: Left AMA        * Disposition: Left AMA      Discharge Medications Reconciliation:     Discharge Medication List as of 12/09/2019  6:13 AM                  Signed:   Sudie BaileyAnthony A Janeal Abadi, MD   12/09/2019   7:50 AM

## 2019-12-09 NOTE — Progress Notes (Signed)
 [id number] - Patient called for primary RN and asked to have IV removed.  Patient was standing at the bedside fully dressed.  This RN went to room to speak with patient.  Patient stated, It is not fair that if you call for your nurse for pain medicine that you have to wait 45 minutes to get your pain meds.  My nurse should have brought them to me quicker.  What would you do if I called out because I was lying on the floor having a seizure.  You people don't care that I am here for cardiac stuff, I need those pain meds.  This RN tried to talk to patient but patient refused to engage in conversation about staying and getting the cardiac work-up that is needed.  Patient states that if this writer did not remove his IV he would do it himself and leave.  IV was removed, site benign, catheter tip intact.  Patient educated on possible complications of leaving but refused to sign AMA form.  Patient ambulated off unit with no difficulties.   Nursing supervisor and night hospitalist notified.

## 2019-12-09 NOTE — Progress Notes (Signed)
Chart reviewed. Pt signed out against medical advice before I could see him for discharge needs.

## 2019-12-11 ENCOUNTER — Ambulatory Visit: Admitting: Internal Medicine

## 2019-12-11 ENCOUNTER — Emergency Department
Admit: 2019-12-11 | Disposition: A | Discharge status: Other Acute Inpatient Hospital | Source: Ambulatory Visit | Attending: Emergency Medicine | Admitting: Emergency Medicine

## 2019-12-11 ENCOUNTER — Observation Stay
Admit: 2019-12-11 | Discharge: 2019-12-11 | Discharge status: Against Medical Advice | Payer: No Typology Code available for payment source

## 2019-12-11 LAB — HX HEM-ROUTINE
HX BASO #: 0 10*3/uL (ref 0.0–0.2)
HX BASO: 0 %
HX EOSIN #: 0.2 10*3/uL (ref 0.0–0.5)
HX EOSIN: 3 %
HX HCT: 40 % (ref 37.0–47.0)
HX HGB: 13.1 g/dL — ABNORMAL LOW (ref 13.5–16.0)
HX LYMPH #: 2.2 10*3/uL (ref 1.0–4.0)
HX LYMPH: 40 %
HX MCH: 30.4 pg (ref 26.0–34.0)
HX MCHC: 32.8 g/dL (ref 32.0–36.0)
HX MCV: 92.8 fL (ref 80.0–98.0)
HX MONO #: 0.4 10*3/uL (ref 0.2–0.8)
HX MONO: 8 %
HX MPV: 11.1 fL (ref 9.1–11.7)
HX NEUT #: 2.7 10*3/uL (ref 1.5–7.5)
HX NRBC #: 0 10*3/uL
HX NUCLEATED RBC: 0 %
HX PLT: 177 10*3/uL (ref 150–400)
HX RBC BLOOD COUNT: 4.31 M/uL (ref 4.20–5.50)
HX RDW: 12.5 % (ref 11.5–14.5)
HX SEG NEUT: 49 %
HX TOT CELLS CN: 115
HX WBC: 5.5 10*3/uL (ref 4.0–11.0)

## 2019-12-11 LAB — HX CHOLESTEROL
HX CHOLESTEROL: 166 mg/dL (ref 110–199)
HX HIGH DENSITY LIPOPROTEIN CHOL (HDL): 30 mg/dL — ABNORMAL LOW (ref 35–75)
HX LDL: 101 mg/dL (ref 0–129)
HX TRIGLYCERIDES: 173 mg/dL (ref 40–250)

## 2019-12-11 LAB — HX CHEM-ENZ-FRAC: HX TROPONIN I: <0.01 ng/mL (ref 0.00–0.03)

## 2019-12-11 LAB — HX CHEM-LIPIDS
HX CHOL-HDL RATIO: 5.5
HX CHOLESTEROL: 166 mg/dL (ref 110–199)
HX HIGH DENSITY LIPOPROTEIN CHOL (HDL): 30 mg/dL — ABNORMAL LOW (ref 35–75)
HX LDL: 101 mg/dL (ref 0–129)
HX TRIGLYCERIDES: 173 mg/dL (ref 40–250)

## 2019-12-11 LAB — BMP (EXT)
Anion Gap (EXT): 13 mmol/L (ref 3–17)
BUN (EXT): 13 mg/dL (ref 8–25)
CO2 (EXT): 25 mmol/L (ref 23–32)
CalciumCalcium (EXT): 9.6 mg/dL (ref 8.5–10.5)
Chloride (EXT): 101 mmol/L (ref 98–108)
Creatinine (EXT): 0.97 mg/dL (ref 0.60–1.50)
GFR Estimated (Calc) (EXT): 96 mL/min/{1.73_m2} (ref >59)
Glucose (EXT): 322 mg/dL — ABNORMAL HIGH (ref 70–110)
Potassium (EXT): 4.3 mmol/L (ref 3.4–5.0)
Sodium (EXT): 139 mmol/L (ref 135–145)

## 2019-12-11 LAB — HX DIABETES
HX GLUCOSE: 273 mg/dL — ABNORMAL HIGH (ref 70–139)
HX HEMOGLOBIN A1C: 10.2 % — ABNORMAL HIGH

## 2019-12-11 LAB — HX TOXICOLOGY-DRUG, SERUM
HX ACETAMINOPHEN: <3 ug/mL — ABNORMAL LOW (ref 10–20)
HX BARBITUATES QL, SERUM: NOT DETECTED
HX BENZODIAZEPINES QL, SERUM: NOT DETECTED
HX ETHANOL: <10 mg/dL
HX SALICYLATE: <5 mg/dL — ABNORMAL LOW (ref 15.0–29.9)
HX TCA: NOT DETECTED

## 2019-12-11 LAB — HX CHEM-PANELS
HX ANION GAP: 7 (ref 3–14)
HX BLOOD UREA NITROGEN: 14 mg/dL (ref 6–24)
HX CHLORIDE (CL): 102 meq/L (ref 98–110)
HX CO2: 29 meq/L (ref 20–30)
HX CREATININE (CR): 0.93 mg/dL (ref 0.57–1.30)
HX GFR, AFRICAN AMERICAN: 117 mL/min/{1.73_m2}
HX GFR, NON-AFRICAN AMERICAN: 101 mL/min/{1.73_m2}
HX GLUCOSE: 273 mg/dL — ABNORMAL HIGH (ref 70–139)
HX POTASSIUM (K): 4 meq/L (ref 3.6–5.1)
HX SODIUM (NA): 138 meq/L (ref 135–145)

## 2019-12-11 LAB — HX COAGULATION
HX INR PT: 1 (ref 0.9–1.3)
HX PROTHROMBIN TIME: 11.3 s (ref 9.7–14.0)
HX PTT: 28.6 s (ref 25.7–35.7)

## 2019-12-11 LAB — HX MICRO-RESP VIRAL PANEL: HX COVID-19 (SARS-COV-2) RAPID: NEGATIVE

## 2019-12-11 LAB — HX TRANSFUSION

## 2019-12-11 LAB — HX CHEM-METABOLIC: HX HEMOGLOBIN A1C: 10.2 % — ABNORMAL HIGH

## 2019-12-11 LAB — RESPIRATORY PANEL EXTENDED CARE EVERYWHERE: COVID-19 CARE EVERYWHERE: NEGATIVE — NL

## 2019-12-11 LAB — PROTHROMBIN TIME CARE EVERYWHERE
INR CARE EVERYWHERE: 1 — NL (ref 0.9–1.1)
PROTHROMBIN TIME CARE EVERYWHERE: 13.4 s — NL (ref 11.5–14.5)

## 2019-12-11 NOTE — ED Provider Notes (Signed)
 [name]  [name], [name]  MRN: [id number]  Age: 44 yrs  Sex: Male  DOB: 05/03/1976  Arrival Date: 12/11/2019  Arrival Time: 11:49  Account#: [id number]  Bed Pending Adult  PCP: None, Pcp  Chief Complaint: Chest Pain  .  Presentation:  07/23  11:50 Presenting complaint: EMS states: 1 hour of sudden onset left   mm28        sided CP radiating into left arm, "stabbing/squeezing", 10/10.        Significant cardiac history. Arrives A/OX3, NSR per EMS. Care        prior to arrival: Glucose check. 278.  11:50 Acuity: Adult 2                                                 mm28  11:50 Method Of Arrival: EMS: [facility]                                 mm28  11:54 Care prior to arrival: Medication(s) given: ASA.                mm28  .  Historical:  - Allergies:  11:49 Coconut;                                                        mm28  11:49 Codeine;                                                        mm28  11:49 Ibuprofen;                                                      mm28  11:49 Morphine;                                                       mm28  11:49 Motrin;                                                         mm28  11:49 NTG (drops his BP);                                             mm28  11:49 Toradol;  mm28  11:49 Tramadol HCl;                                                   mm28  - Home Meds:  11:49 aspirin 81 mg Oral chew 1 tab once daily [Active]; Lipitor Oral mm28        [Active]; Metoprolol Tartrate Oral [Active];  - PMHx:  11:49 CAD; High Cholesterol; mi x3; NIDDM;                            mm28  - PSHx:  11:49 Cardiac Bypass Surgery x2; gallbladder surgery;                 mm28  .  - Social history: Smoking status: Patient uses tobacco    products, current every day smoker.  - The history from nurses notes was reviewed: and I agree with    what is documented.  .  .  Screening:  11:51 SEPSIS SCREENING SIRS Criteria (> = 2) No. Safety screen:        mm28        Patient feels safe. Exposure Risk/Travel Screening: COVID        Symptomsquestion None. Known COVID 19 exposurequestion No. Have you tested +        for COVIDquestion No. COVID 19 Vaccinequestion Yes-patient states they        completed COVID vaccine recommendations over 2 weeks ago.  .  Vital Signs:  .  [name], [name]  [id number]  [id number]  Page 1 of 4  %%PAGE  .  [name], [name]  MRN: [id number]  Age: 41 yrs  Sex: Male  DOB: April 09, 1976  Arrival Date: 12/11/2019  Arrival Time: 11:49  Account#: [id number]  Bed Pending Adult  PCP: None, Pcp  Chief Complaint: Chest Pain  .  11:51 BP 103 / 65; Pulse 72; Resp 18; Temp 36.9(O); Pulse Ox 96% on   mm28        R/A; Pain 10/10;  12:17 BP 118 / 54; Pulse 71; Resp 18; Pulse Ox 99% on R/A;            lk14  13:00 BP 115 / 60; Pulse 69; Resp 20; Pulse Ox 98% on R/A;            lk14  15:05 Pulse 59; Resp 18; Pulse Ox 99% on R/A; Pain 7/10;              lk14  16:03 BP 113 / 58; Pulse 63; Resp 18; Pulse Ox 98% on R/A; Pain 7/10; lk14  17:05 BP 107 / 55; Pulse 60; Resp 16; Pulse Ox 99% on R/A;            lk14  .  Glasgow Coma Score:  11:51 Eye Response: spontaneous(4). Verbal Response: oriented(5).     mm28        Motor Response: obeys commands(6). Total: 15.  12:08 Eye Response: spontaneous(4). Verbal Response: oriented(5).     lk14        Motor Response: obeys commands(6). Total: 15.  12:10 Eye Response: spontaneous(4). Verbal Response: oriented(5).     lk14        Motor Response: obeys commands(6). Total: 15.  12:17 Eye Response: spontaneous(4). Verbal Response: oriented(5).     [id number]  Motor Response: obeys commands(6). Total: 15.  13:00 Eye Response: spontaneous(4). Verbal Response: oriented(5).     lk14        Motor Response: obeys commands(6). Total: 15.  16:03 Eye Response: spontaneous(4). Verbal Response: oriented(5).     lk14        Motor Response: obeys commands(6). Total: 15.  16:08 Eye Response: spontaneous(4). Verbal Response:  oriented(5).     lk14        Motor Response: obeys commands(6). Total: 15.  17:07 Eye Response: spontaneous(4). Verbal Response: oriented(5).     lk14        Motor Response: obeys commands(6). Total: 15.  .  Triage Assessment:  11:54 General: Appears in no apparent distress, comfortable, obese,   mm28        well developed, well nourished, well groomed, Behavior is        cooperative, quiet. Pain: Complains of pain in chest. Neuro: No        deficits noted. Cardiovascular: Capillary refill < 3 seconds        Chest pain is described as 10/10. Respiratory: Airway is patent        Respiratory effort is even, unlabored, Respiratory pattern is        regular, symmetrical. Skin: Skin is pink, warm / dry.  .  Assessment:  12:08 Reassessment: pt reports chest pain x30 mins with hx of prior   lk14        MI. General: Appears in no apparent distress, comfortable,        Behavior is cooperative, flat. Pain: Complains of pain in chest        the pain radiates to the: Pain does not radiate. Pain currently        is 10/10. Neuro: Eye opening: Spontaneously Level on        consciousness: Sustained Attention Verbal Response:  .  [name], [name]  [id number]  [id number]  Page 2 of 4  %%PAGE  .  [name], [name]  MRN: [id number]  Age: 9 yrs  Sex: Male  DOB: 1975-08-15  Arrival Date: 12/11/2019  Arrival Time: 11:49  Account#: [id number]  Bed Pending Adult  PCP: None, Pcp  Chief Complaint: Chest Pain  .        Orientation: Oriented x 3 Speech: Clear. Cardiovascular:        Capillary refill < 3 seconds Pulses are 2+ in right radial        artery and left radial artery. Respiratory: Airway is patent        Respiratory effort is even, unlabored, Respiratory pattern is        regular. Skin: Skin is pink, warm / dry.  12:10 Reassessment: unable to obtain PIV access at this time. Tobi Bastos,   YQ65        RN to attempt with ultrasound guided.  12:30 Reassessment: PIV placed by ultrasound. pt resting on           lk14         stretcher, in NAD, resp unlabored, warm and well perfused.  15:03 Reassessment: pt medicated and resting comfortably in stretcher lk14        at this time. pt tolerating PO orange juice.  15:47 Reassessment: admitting team at bedside.                        lk14  16:08 Reassessment: pt ok to PO per MD. pt given sandwich and juice.  lk14  pt pending admission. pending med orders from cardiology for        pain control.  17:07 Reassessment: pt refusing ordered tylenol. pt reports he wants  lk14        to leave ED. admitting team paged. pt taking leads and BP cuff        off. PIV taken out. ED PA and MD aware.  17:11 Reassessment: pt left ED AMA.                                   ZO10  17:31 Reassessment: Admitting team and ADT aware pt left ER AMA.      lk14  .  Observations:  11:49 Patient arrived in ED.                                          mm28  11:51 Triage Completed.                                               mm28  12:16 Patient Visited By: Mayer Camel                               fw2  13:59 Patient Visited By: Elba Barman                             bs  13:59 Registration completed.                                         bs  15:41 Patient assigned to T3                                          mm28  17:29 Patient Visited By: Max Fickle  .  Procedure:  12:12 EKG done. (by ED staff). Old EKG Obtained Reviewed By: Jones Broom MD.  12:32 Inserted peripheral IV: 20 gauge in left forearm.               am33  12:33 BUN (Blood Urea Nitrogen) Sent.                                 am33  12:33 CBC/Diff (With Plt) Sent.                                       am33  12:33 CR (Creatinine) Sent.  am33  12:33 GLU (Glucose) Sent.                                             am33  12:33 LYTES (Na, K, Cl, Co2) Sent.                                    am33  12:33 PT (Prothrombin Time With INR) Sent.                             am33  12:33 PTT Sent.                                                       am33  12:33 Troponin I Sent.                                                am33  .  [name], [name]  [id number]  [id number]  Page 3 of 4  %%PAGE  .  [name], [name]  MRN: [id number]  Age: 63 yrs  Sex: Male  DOB: 1975/08/31  Arrival Date: 12/11/2019  Arrival Time: 11:49  Account#: [id number]  Bed Pending Adult  PCP: None, Pcp  Chief Complaint: Chest Pain  .  16:03 COVID-19 PCR (Asymptomatic patients) Sent.                      lk14  17:11 Discontinued IV bleeding controlled, pressure dressing applied, lk14        No redness/swelling at site.  .  Dispensed Medications:  14:43 Drug: Zofran 4 mg Route: IVP;                                   em21  14:43 Drug: morphine 4 mg Route: IVP;                                 em21  .  [name]  Interventions:  11:51 Patient placed in exam room on stretcher on oxygen, on pulse    mm28        oximetry, on cardiac monitor.  14:12 Demo Sheet Scanned into Chart                                   dg20  15:26 ECG/EKG Scanned into Chart                                      jj12  .  Outcome:  15:18 Decision to Hospitalize by Provider.  apc1  17:31 AMA Left before signing form. Condition: good Condition:        lk14        unchanged. Instructed on return precautions. Discharge        Assessment: Patient awake, alert and oriented x 3. No cognitive        and/or functional deficits noted. Patient verbalized        understanding of disposition instructions. Chart Status Nursing        note complete and electronically signed.  17:33 Patient left the ED.                                            lk14  .  Corrections: (The following items were deleted from the chart)  16:04 15:05 Pulse 59bpm; Resp 18bpm; Pulse Ox 99% RA; lk14            lk14  .  Signatures:  [place]oh[place], Bernice                         Reg  bs  [place], Megan                             BSN  mm28  Mayer Camel                            MD   fw2  Ulyses Southward                            BSN  [address], Adam                          PA-C apc1  [place], [place]                                CCT  vt6  Marijo Conception                          BSN  lk14  Melanee Left                            RN   em21  Danice Goltz                 Sec  dg20  Jun, Jihoon                             Sec  jj12  .  [name]  [name], [name]  [id number]  [id number]  Page 4 of 4  .  %%END

## 2019-12-11 NOTE — ED Provider Notes (Signed)
[name]  [name], [name]  MRN: [id number]  Age: 44 yrs  Sex: Male  DOB: 03-Jan-1976  Arrival Date: 12/11/2019  Arrival Time: 11:49  Account#: [id number]  .  Working Diagnosis: Chest pain, unspecified  PCP: None, Pcp  .  HPI:  07/54  37:54 44 year old obese male smoker with pmh of HTN,                  apc1        hypercholesteremia, NIDDM, CAD with MI x3, and CABG x2        presenting for evaluation of chest pain. Patient experiencing        10/10 chest pain x 1 hour. Pain onset while walking. Pain        described as pressure with radiation to left arm and left neck.        Received full dose aspirin by EMS, otherwise no intervention        PTA. He endorses allergies to nitroglycerin, ibuprofen,        tylenol, and tramadol. He denies sob, leg swelling, recent        travel or hospitalization, or history of PE/DVT. .  .  Historical:  - Allergies: Coconut; Codeine; Ibuprofen; Morphine; Motrin; NTG (drops his    BP); Toradol; Tramadol HCl;  - Home Meds: aspirin 81 mg Oral chew 1 tab once daily; Lipitor Oral;    Metoprolol Tartrate Oral;  - PMHx: CAD; High Cholesterol; mi x3; NIDDM;  - PSHx: Cardiac Bypass Surgery x2; gallbladder surgery;  - Social history: Smoking status: Patient uses tobacco    products, current every day smoker.  - The history from nurses notes was reviewed: and I agree with    what is documented.  .  .  ROS:  12:56 Constitutional: Negative for chills, fever.                     apc1  12:56 Eyes: Negative for blurry vision.  12:56 ENT: Negative for sore throat.  12:56 Neck Positive for pain at rest.  12:56 Cardiovascular: Positive for chest pain.  12:56 Respiratory: Negative for shortness of breath.  12:56 Abdomen/GI: Positive for nausea, Negative for vomiting.  12:56 Back: Negative for radiated pain.  12:56 MS/extremity: Positive for radiated pain, Negative for swelling.  12:56 Skin: Positive for diaphoresis.  12:56 Neuro: Positive for dizziness.  12:56 Psych: Negative for drug dependence, alcohol  dependence.  .  Vital Signs:  11:51 BP 103 / 65; Pulse 72; Resp 18; Temp 36.9(O); Pulse Ox 96% on   mm28        R/A; Pain 10/10;  .  [name]  [id number]  [id number]  Page 1 of 6  %%PAGE  .  [name]  MRN: [id number]  Age: 59 yrs  Sex: Male  DOB: [birth year]  Arrival Date: 12/11/2019  Arrival Time: 11:49  Account#: [id number]  .  Working Diagnosis: Chest pain, unspecified  PCP: None, Pcp  .  12:17 BP 118 / 54; Pulse 71; Resp 18; Pulse Ox 99% on R/A;            lk14  13:00 BP 115 / 60; Pulse 69; Resp 20; Pulse Ox 98% on R/A;            lk14  15:05 Pulse 59; Resp 18; Pulse Ox 99% on R/A; Pain 7/10;              lk14  16:03  BP 113 / 58; Pulse 63; Resp 18; Pulse Ox 98% on R/A; Pain 7/10; lk14  17:05 BP 107 / 55; Pulse 60; Resp 16; Pulse Ox 99% on R/A;            lk14  .  Glasgow Coma Score:  11:51 Eye Response: spontaneous(4). Verbal Response: oriented(5).     mm28        Motor Response: obeys commands(6). Total: 15.  12:08 Eye Response: spontaneous(4). Verbal Response: oriented(5).     lk14        Motor Response: obeys commands(6). Total: 15.  12:10 Eye Response: spontaneous(4). Verbal Response: oriented(5).     lk14        Motor Response: obeys commands(6). Total: 15.  12:17 Eye Response: spontaneous(4). Verbal Response: oriented(5).     lk14        Motor Response: obeys commands(6). Total: 15.  13:00 Eye Response: spontaneous(4). Verbal Response: oriented(5).     lk14        Motor Response: obeys commands(6). Total: 15.  16:03 Eye Response: spontaneous(4). Verbal Response: oriented(5).     lk14        Motor Response: obeys commands(6). Total: 15.  16:08 Eye Response: spontaneous(4). Verbal Response: oriented(5).     lk14        Motor Response: obeys commands(6). Total: 15.  17:07 Eye Response: spontaneous(4). Verbal Response: oriented(5).     lk14        Motor Response: obeys commands(6). Total: 15.  .  Exam:  12:56 Constitutional: The patient appears well nourished, alert, well apc1         developed, awake, comfortable, non-diaphoretic, non-toxic,        playing game on cell phone  12:56 Head/face: normocephalic, atraumatic.  12:56 Eyes: Pupils: equal, round, and reactive to light.  12:56 ENT: Posterior pharynx: Airway: patent.  12:56 Respiratory: the patient does not display signs of respiratory        distress, CTAB.  12:56 Chest/axilla: mid-line sternal scar.  12:56 Cardiovascular: Rate: normal, Rhythm: regular, Edema: is not        appreciated.  12:56 Abdomen/GI: soft, non-tender, non-distended, numerous        well-healed scars.  12:56 Back: Tenderness None large linear scar to right thoracic back.  12:56 Musculoskeletal/extremity: normal full rom, no peripheral        edema.  12:56 Neuro: Mentation: lucid, able to follow commands, Cranial  .  [name]  [id number]  [id number]  Page 2 of 6  %%PAGE  .  [name]  MRN: [id number]  Age: 26 yrs  Sex: Male  DOB: [birth year]  Arrival Date: 12/11/2019  Arrival Time: 11:49  Account#: [id number]  .  Working Diagnosis: Chest pain, unspecified  PCP: None, Pcp  .        nerves: Grossly intact  12:56 Psych: calm and cooperative.  12:56 Skin: warm and dry.  .  MDM:  .  07/23  11:53 Order name: BUN (Blood Urea Nitrogen); Complete Time: 13:33     mm28  07/23  11:53 Order name: CBC/Diff (With Plt); Complete Time: 13:33           mm28  07/23  11:53 Order name: CR (Creatinine); Complete Time: 13:33               mm28  07/23  11:53 Order name: GLU (Glucose); Complete Time: 13:33                 mm28  07/23  11:53 Order name: LYTES (Na, K, Cl, Co2); Complete Time: 13:33        mm28  07/23  11:53 Order name: PT (Prothrombin Time With INR); Complete Time: 13:58mm28  07/23  11:53 Order name: PTT; Complete Time: 13:33                           mm28  07/23  11:53 Order name: Troponin I; Complete Time: 13:33                    mm28  07/23  11:56 Order name: PATIENTPING STORY; Complete Time: 12:55             dispa  t  07/23  11:57 Order  name: [facility]; Complete Time: 12:55                 dispa  t  07/23  12:55 Order name: Urine Drug Screen                                   apc1  07/23  12:57 Order name: Blood Bank Hold; Complete Time: 13:33               dispa  t  07/23  12:59 Order name: GFR, NAA; Complete Time: 13:33                      dispa  t  07/23  12:59 Order name: GFR, AA; Complete Time: 13:33                       dispa  t  07/23  11:53 Order name: CXR (PA/Lat); Complete Time: 12:55                  mm28  07/23  11:53 Order name: Adult EKG (order using folder); Complete Time: 11:43mm28  07/23  11:53 Order name: Cardiac Monitor; Complete Time: 12:10               mm28  07/23  11:53 Order name: EKG (order using folder); Complete Time: 12:12      mm28  .  Name:Sliger, Mateo  [id number]  [id number]  Page 3 of 6  %%PAGE  .  Name: Pau, Banh  MRN: [id number]  Age: 82 yrs  Sex: Male  DOB: 06-18-1975  Arrival Date: 12/11/2019  Arrival Time: 11:49  Account#: [id number]  .  Working Diagnosis: Chest pain, unspecified  PCP: None, Pcp  .  07/23  11:53 Order name: Oxygen at 2l/M nasal cannula; Complete Time: 12:13  mm28  07/23  11:53 Order name: Pulse Oximetry Continuous; Complete Time: 12:10     mm28  07/23  12:55 Order name: ADD Tox Screen - Serum; Complete Time: 12:57        apc1  07/23  13:21 Order name: RBC Morph; Complete Time: 13:33                     dispa  t  07/23  13:51 Order name: Serum Drug Screen                                   dispa  t  07/23  14:26 Order name: Consult Orders-Cardiology, Adult (Cardiology);  apc1        Complete Time: 17:30  07/23  15:19 Order name: COVID-19 PCR (Asymptomatic patients)                apc1  07/23  15:42 Order name: Lipid Panel                                         dispa  t  07/23  15:49 Order name: Hgb A1C                                             dispa  t  .  Dispensed Medications:  14:43 Drug: Zofran 4 mg Route: IVP;                                   em21  14:43 Drug:  morphine 4 mg Route: IVP;                                 em21  .  [name]  Radiology Orders:  Order Name: CXR (PA/Lat); Last Status: Reviewed; Time: 12/11/19    11:53; By: [id number]; For: mm10; Order Method: Verbal - Read back;    Sign Off: Mayer Camel - 07/23 12:31; Notes: Bed Name: T3  Attending Notes:  14:33 Attestation: Assessment and care plan reviewed with             fw2/s  b44        resident/midlevel provider. See their note for details.        Physician Assistant's history reviewed, patient interviewed and        examined. I have reviewed Nurses Notes, Old Records in:        [employer]. [employer].  14:33 Attending HPI: HPI: 44 year old male patient with PMHx of HTN,  fw2/s  b44        Hyperlipidemia, CAD with MI x3, CABG x2, sternotomy scar        presenting to ED today for evaluation of chest pain. Patient        states 1 hr PTA he was [place], with sudden onset        squeezing CP radiating to left arm/neck 10/10 pain. Patient        endorsed nausea and intermittent dizziness but denied vomiting.        Patient was given aspirin en route via EMS. Patient is an obese  .  Name:Tiedt, Damauri  [id number]  [id number]  Page 4 of 6  %%PAGE  .  Name: Naksh, Radi  MRN: [id number]  Age: 3 yrs  Sex: Male  DOB: [birth year]  Arrival Date: 12/11/2019  Arrival Time: 11:49  Account#: [id number]  .  Working Diagnosis: Chest pain, unspecified  PCP: None, Pcp  .        male.  14:33 Attending ROS As documented by Physician Assistant ROS and HPI. fw2/s  b44  14:34 Attending Exam: My personal exam reveals Head: NC/AT Eyes:      fw2/s  b44        PEARL ENT: Moist mucous membranes, OP clear, TMs clear b/l.  Neck: Soft and supple, full ROM, no adenopathy, no c-spine        tenderness. No carotid bruits. Chest: No palpable tenderness,        no e/o trauma. Well healed surgical scars. No chest wall pain.        Lungs: CTAB. CVS: RRR, normal S1 and S2, no m/r. Abdomen: Soft,        nontender, no palpable masses, no  guarding or rebound. Well        healed surgical scars. Extremities: Full ROM of all joints,        NVI. No pitting edema. Back: No palpable tenderness, no e/o        trauma. Skin: Intact, no e/o cellulitis. Neuro: Awake and        alert, MAE, no facial asymmetry, no gross focal deficits.  14:36 Lab/Ancillary show: Labs were reviewed and interpreted by me:   fw2/s  b44        GLU 273, HGB 13.1, Polychromasia 1+.  14:36 Lab/Ancillary show: Chest X-ray shows: FINDINGS/IMPRESSION:     fw2/s  b44        Quality:Adequate for portable technique. Lines and tubes:        Stable sternotomy wires, and a mid sternal closure device.        Right paramedial surgical staples. External cardiac leads Heart        and mediastinum: Normal for portable radiograph. Lungs and        pleura: Lungs are clear. No pneumothorax. No pleural effusions.        Bones: No suspicious bony lesions.  15:21 Lab/Ancillary show: EKG interpreted by me and shows: sinus at   fw2/s  b44        70bpm, normal intervals normal axis no ischemia.  15:21 Scribe Scribe Chart Complete Scribe chart completed for Dr.     fw2/s  b44        Anola Gurney. I, Etheleen Nicks Bankowski am scribing for, and under the        direction of, Dr. Anola Gurney. Electronically signed by Alona Bene 12/11/2019.  [name]  Disposition Summary:  12/11/19 15:18  Hospitalization Ordered        Hospitalization Status: Observation                             apc1        Provider: Annamarie Dawley                                        apc1        Location: Telemetry                                             apc1        Condition: Stable                                               apc1        Problem: new  apc1        Symptoms: have improved                                         apc1        Bed/Room Type: Regular                                          apc1        Room Assignment: [facility] BED*(12/11/19 15:41)                   mm28         Diagnosis          - Chest pain, unspecified                                     apc1  .  Name:Yontz, Harlo  [id number]  [id number]  Page 5 of 6  %%PAGE  .  Name: Kennard, Fildes  MRN: [id number]  Age: 42 yrs  Sex: Male  DOB: 1975-06-30  Arrival Date: 12/11/2019  Arrival Time: 11:49  Account#: [id number]  .  Working Diagnosis: Chest pain, unspecified  PCP: None, Pcp  .        Forms:          - Engineer, structural          - Fax Summary                                                 apc1  Signatures:  Dispatcher, [employer]                          dispa  Soohoo, Bernice                         Reg  bs  Dennard Nip                             BSN  mm28  Mayer Camel                           MD   fw2  [place], Adam                          PA-C apc1  Melanee Left                            RN   [address], Scribe              [phone]  Sula Soda  Tomi Bamberger                 Sec  dg20  .  Corrections: (The following items were deleted from the chart)  12:71 40:73 44 year old obese male with pmh of HTN,                   apc1        hypercholesteremia. apc1  12:56 12:56 Constitutional: Negative for apc1                         apc1  15:41 15:18 apc1                                                      mm28  .  Document is preliminary until electronically or manually signed by the atte  nding physician  .  .  .  .  .  .  .  .  .  .  .  .  .  .  .  .  .  .  .  .  .  .  Name:Dunklee, Rushil  [id number]  [id number]  Page 6 of 6  .  %%END

## 2019-12-14 NOTE — Telephone Encounter (Signed)
 Called # on file. Automated thing said Call rejected    Dustin Carney

## 2019-12-16 LAB — PROTHROMBIN TIME CARE EVERYWHERE
INR CARE EVERYWHERE: 1.01 — NL (ref 0.8–1.2)
PROTHROMBIN TIME CARE EVERYWHERE: 11.7 s — NL (ref 9.2–13.5)

## 2019-12-16 LAB — TYPE AND SCREEN CARE EVERYWHERE
ABO/RH CARE EVERYWHERE: O POS — NL
ANTIBODY SCREEN CARE EVERYWHERE: NEGATIVE — NL

## 2019-12-16 LAB — COVID-19 AND INFLUENZA POC CARE EVERYWHERE
COVID-19 POC CARE EVERYWHERE: NEGATIVE — NL
INFLUENZA A POC CARE EVERYWHERE: NEGATIVE — NL
INFLUENZA B POC CARE EVERYWHERE: NEGATIVE — NL

## 2019-12-16 LAB — APTT CARE EVERYWHERE: APTT CARE EVERYWHERE: 31 s — NL (ref 27–37)

## 2019-12-16 LAB — LAB EXTERNAL RESULT UNMAPPED

## 2019-12-16 NOTE — Telephone Encounter (Signed)
Called pt a second time came up with same call rejected message.  Sending as Dustin Carney

## 2019-12-17 LAB — BMP (EXT)
Anion Gap (EXT): 15 mmol/L (ref 3–17)
BUN (EXT): 12 mg/dL (ref 8–25)
CO2 (EXT): 24 mmol/L (ref 23–32)
CalciumCalcium (EXT): 9.4 mg/dL (ref 8.5–10.5)
Chloride (EXT): 98 mmol/L (ref 98–108)
Creatinine (EXT): 0.98 mg/dL (ref 0.60–1.50)
GFR Estimated (Calc) (EXT): 95 mL/min/{1.73_m2} (ref >59)
Glucose (EXT): 369 mg/dL — ABNORMAL HIGH (ref 70–110)
Potassium (EXT): 4 mmol/L (ref 3.4–5.0)
Sodium (EXT): 137 mmol/L (ref 135–145)

## 2019-12-17 NOTE — Telephone Encounter (Signed)
Please send letter to address that is listed in chart if patient calls office please obtain phone number that is current along with a current address. Thank you Harriett Rush [place], [place]

## 2019-12-17 NOTE — Telephone Encounter (Signed)
Second # kept ringing. Pts address is in [place]    [place]

## 2019-12-22 NOTE — Telephone Encounter (Signed)
Letter sent. Dustin Carney

## 2019-12-23 ENCOUNTER — Ambulatory Visit: Admitting: Emergency Medicine

## 2019-12-23 ENCOUNTER — Emergency Department
Admit: 2019-12-23 | Disposition: A | Discharge status: Home / Self Care | Source: Home / Self Care | Attending: Emergency Medicine | Admitting: Emergency Medicine

## 2019-12-23 ENCOUNTER — Emergency Department
Admit: 2019-12-23 | Discharge: 2019-12-23 | Disposition: A | Discharge status: Home / Self Care | Payer: No Typology Code available for payment source

## 2019-12-23 LAB — HX CHEM-PANELS
HX ANION GAP: 13 (ref 3–14)
HX BLOOD UREA NITROGEN: 8 mg/dL (ref 6–24)
HX CHLORIDE (CL): 98 meq/L (ref 98–110)
HX CO2: 22 meq/L (ref 20–30)
HX CREATININE (CR): 1.13 mg/dL (ref 0.57–1.30)
HX GFR, AFRICAN AMERICAN: 92 mL/min/{1.73_m2}
HX GFR, NON-AFRICAN AMERICAN: 80 mL/min/{1.73_m2}
HX GLUCOSE: 568 mg/dL — AB (ref 70–139)
HX POTASSIUM (K): 4.1 meq/L (ref 3.6–5.1)
HX SODIUM (NA): 133 meq/L — ABNORMAL LOW (ref 135–145)

## 2019-12-23 LAB — HX HEM-ROUTINE
HX BASO #: 0 10*3/uL (ref 0.0–0.2)
HX BASO: 0 %
HX EOSIN #: 0.1 10*3/uL (ref 0.0–0.5)
HX EOSIN: 1 %
HX HCT: 43.5 % (ref 37.0–47.0)
HX HGB: 14.4 g/dL (ref 13.5–16.0)
HX IMMATURE GRANULOCYTE#: 0 10*3/uL (ref 0.0–0.1)
HX IMMATURE GRANULOCYTE: 1 %
HX LYMPH #: 1.6 10*3/uL (ref 1.0–4.0)
HX LYMPH: 27 %
HX MCH: 30.1 pg (ref 26.0–34.0)
HX MCHC: 33.1 g/dL (ref 32.0–36.0)
HX MCV: 91 fL (ref 80.0–98.0)
HX MONO #: 0.4 10*3/uL (ref 0.2–0.8)
HX MONO: 7 %
HX MPV: 11.5 fL (ref 9.1–11.7)
HX NEUT #: 3.7 10*3/uL (ref 1.5–7.5)
HX NRBC #: 0 10*3/uL
HX NUCLEATED RBC: 0 %
HX PLT: 223 10*3/uL (ref 150–400)
HX RBC BLOOD COUNT: 4.78 M/uL (ref 4.20–5.50)
HX RDW: 12.2 % (ref 11.5–14.5)
HX SEG NEUT: 63 %
HX WBC: 5.8 10*3/uL (ref 4.0–11.0)

## 2019-12-23 LAB — HX CHEM-BLOODGAS
HX BASE EXCESS: 0.4 mmol/L
HX BICARBONATE: 25 meq/L (ref 21–28)
HX CALCULATED CO2: 22 meq/L (ref 17–32)
HX CARBOXYHEMOGLOBIN: 4.3 % — ABNORMAL HIGH (ref 0.0–3.0)
HX FIO2: 21
HX METHEMOGLOBIN: 1.1 % (ref 0.0–1.8)
HX OXYGEN SATURATION, MEASURED (SVO2): 84 % — ABNORMAL LOW (ref 95.0–98.0)
HX PCO2: 44 mmHg (ref 35–45)
HX PH: 7.38 (ref 7.35–7.45)
HX PO2: 50 mmHg — ABNORMAL LOW (ref 85–105)

## 2019-12-23 LAB — HX DIABETES
HX GLUCOSE: 568 mg/dL — AB (ref 70–139)
HX HEMOGLOBIN A1C: 12 % — ABNORMAL HIGH

## 2019-12-23 LAB — HX BF-URINALYSIS
HX KETONES: NEGATIVE mg/dL
HX LEUKOCYTE ES: NEGATIVE
HX NITRITE LEVEL: NEGATIVE
HX SPECIFIC GRAVITY: >=1.035
HX U BILIRUBIN: NEGATIVE
HX U BLOOD: NEGATIVE
HX U PH: 5.5
HX U PROTEIN: NEGATIVE mg/dL
HX U UROBILINIG: 0.2 EU

## 2019-12-23 LAB — HX CHEM-OTHER: HX B-HYDROXYBUTYRATE SO: <0.1 mmol/L (ref 0.00–0.27)

## 2019-12-23 LAB — HX POINT OF CARE
HX GLUCOSE-POCT: 467 mg/dL — AB (ref 70–139)
HX GLUCOSE-POCT: 308 mg/dL — ABNORMAL HIGH (ref 70–139)

## 2019-12-23 LAB — HX CHEM-METABOLIC: HX HEMOGLOBIN A1C: 12 % — ABNORMAL HIGH

## 2019-12-23 NOTE — ED Provider Notes (Signed)
 [name] Kitchen  [name], [name]  MRN: [id number]  Age: 44 yrs  Sex: Male  DOB: [birth year]  Arrival Date: 12/23/2019  Arrival Time: 07:30  Account#: [id number]  .  Working Diagnosis: Hyperglycemia, unspecified,   - Dental pain  PCP:  .  HPI:  08/04  07:59 The patient is a 44 year old obese male smoker with pmh of HTN, am47        hypercholesteremia, NIDDM, CAD with MI x3, and CABG x 2 who        presents for evaluation of several complaints. The patient        reports lower front dental pain x 2 days that is not improving        despite tylenol use and topical lidocaine. He has an        appointment with a dentist on Monday with plan to remove the        front teeth however the pain became too intense prompting him        to present to the ED. The patient also states his blood sugar        has been elevated and notes increased thirst and urination. His        primary.  .  Historical:  - Allergies: Coconut; Codeine; Ibuprofen; Motrin; NTG (drops his BP);    Toradol; Tramadol HCl;  - Home Meds: aspirin 81 mg Oral chew 1 tab once daily; Lipitor Oral;    Metoprolol Tartrate Oral;  - PMHx: CAD; High Cholesterol; mi x3; NIDDM;  - PSHx: Cardiac Bypass Surgery x2; gallbladder surgery;  - Social history: Smoking status: Patient uses tobacco    products, current every day smoker. Patient/guardian denies    using alcohol, street drugs.  .  .  Vital Signs:  07:32 BP 120 / 87; Pulse 115; Resp 16; Temp 36.9(O); Pulse Ox 98% on  em22        R/A;  09:13 Pulse 80; Resp 18; Pulse Ox 96% ;                               tr11  11:16 BP 126 / 67; Pulse 67; Resp 15; Temp 37.0; Pain 0/10;           ac39  .  Glasgow Coma Score:  07:37 Eye Response: spontaneous(4). Verbal Response: oriented(5).     em21        Motor Response: obeys commands(6). Total: 15.  11:16 Eye Response: spontaneous(4). Verbal Response: oriented(5).     ac39        Motor Response: obeys commands(6). Total: 15.  .  MDM:  .  08/04  07:33 Order name: EDIE_CAREPLAN                                        dispa  t  08/04  .  [name], [name]  [id number]  [id number]  Page 1 of 8  %%PAGE  .  Name: [name], [name]  MRN: [id number]  Age: 44 yrs  Sex: Male  DOB: 12-29-1975  Arrival Date: 12/23/2019  Arrival Time: 07:30  Account#: [id number]  .  Working Diagnosis: Hyperglycemia, unspecified,   - Dental pain  PCP:  .  07:33 Order name: PATIENTPING STORY  dispa  t  08/04  07:38 Order name: Glucose, Point of Care                              dispa  t  08/04  07:57 Order name: BUN (Blood Urea Nitrogen)                           am47  08/04  07:57 Order name: Tama Gander So                             am47  08/04  07:57 Order name: CBC/Diff (With Plt)                                 am47  08/04  07:57 Order name: CR (Creatinine)                                     am47  08/04  07:57 Order name: GLU (Glucose)                                       am47  08/04  07:57 Order name: Barnetta Hammersmith, Co2)                              am47  08/04  07:57 Order name: VBG - Blood Gas                                     am47  08/04  09:02 Order name: GFR, AA                                             dispa  t  08/04  09:02 Order name: GFR, NAA                                            dispa  t  08/04  09:29 Order name: UA w/Refl Cult                                      dn8  08/04  07:57 Order name: Adult EKG (order using folder); Complete Time: 07:58am47  08/04  07:57 Order name: AccuCheck(POC); Complete Time: 08:14                am47  08/04  07:57 Order name: EKG (order using folder); Complete Time: 08:10      am47  08/04  07:57 Order name: Urine Dip (POC); Complete Time: 09:15               am47  08/04  09:35 Order name: Cardiac Monitor; Complete Time: 10:06  am47  08/04  09:35 Order name: IV; Complete Time: 10:06                            am47  08/04  09:35 Order name: Refer to ED-OBS; Complete Time: 10:06               am47  08/04  09:35 Order  name: Vital signs q 2 hours; Complete Time: 10:06         am47  08/04  10:22 Order name: ADD Other: Hemoglobin A1c; Complete Time: 10:24     am47  08/04  .  [name], [name]  [id number]  [id number]  Page 2 of 8  %%PAGE  .  [name], [name]  MRN: [id number]  Age: 44 yrs  Sex: Male  DOB: 1976/02/10  Arrival Date: 12/23/2019  Arrival Time: 07:30  Account#: [id number]  .  Working Diagnosis: Hyperglycemia, unspecified,   - Dental pain  PCP:  .  10:54 Order name: AccuCheck(POC); Complete Time: 11:17                am47  .  Dispensed Medications:  08:38 Drug: NS - Sodium Chloride 0.9% IV ml 1000 mL Route: IV; Rate:  dn8        Bolus;  09:08 Drug: Percocet 1 PO - oxyCODONE-acetaminophen 5 mg-325 mg 1     tr11        tabs Route: PO;  10:06 Drug: NS - Sodium Chloride 0.9% IV ml 1000 mL Route: IV; Rate:  dn8        Bolus;  10:09 Drug: Insulin Regular Human 10 units {Co-Signature: tr11 (Tevin dn8        Reardon RN).} Route: IV;  11:55 Drug: metFORMIN 1000 mg Route: PO;                              tr11  .  [name] Kitchen  Attending Notes:  08:16 Attestation: Assessment and care plan reviewed with             bb/lh  23        resident/midlevel provider. See their note for details.        Physician Assistant's history reviewed, patient interviewed and        examined. Attending HPI: Social History: Smoker Works as        Chiropodist HPI: 44 y/o M with PMHx CAD, CABG x 2 (first        here, second [place] [address]), obesity, high cholesterol,        MI x 3, NIDDM x 2.5 yrs presents for evaluation of lower front        tooth dental pain x 2 days. Pt localizes pain closer to right        front teeth, has been using Tylenol and topical lidocaine. Pt        reports he is scheduled to see his dentist in 5 days to get XRs        and see oral surgeon in 5-7 days to get front teeth removed. Pt        states unable to take ibuprofen, cites hives/throat close. Pt        also reports hyperglycemic symptoms - was previously on  70/30        insulin and, regular insulin, Metformin. Pt states came off  metformin 1 tablet qd 12/2018 after he lost weight - notes lost        20-25lbs in past 6 months. Pt notes he had a PCP at [facility]        who retired, pt is on Medicare so is waiting to be assigned a        new provider. Pt endorses increased thirst, increased        urination, blurred vision. Denies fever, sore throat, HA, CP,        SOB, abd pain, dysuria, rashes, discharge in mouth. Pt went to        [facility] 1.5 week ago for hyperglycemia, was reportedly told because        his blood sugar was not >500, "we can't help you." Pt is        currently on metoprolol, baby aspirin, Lipitor. Pt denies        recent abx. In the last 24h, patient reports eating chicken,        steak, asparagus. Pt states he typically drinks juice (apple,        cranberry) and water, occasional soda [facility]; diet        drinks). I have reviewed Nurses Notes.  .  [name], [name]  [id number]  [id number]  Page 3 of 8  %%PAGE  .  Name: [name], [name]  MRN: [id number]  Age: 49 yrs  Sex: Male  DOB: [birth year]  Arrival Date: 12/23/2019  Arrival Time: 07:30  Account#: [id number]  .  Working Diagnosis: Hyperglycemia, unspecified,   - Dental pain  PCP:  .  08:32 Attending ROS Constitutional: Negative for fever, Eyes:         bb/lh  23        Positive for blurry vision, ENT: Positive for dental pain, of        the front lower teeth, Negative for sore throat, Mouth        discharge, Cardiovascular: Negative for chest pain,        Respiratory: Negative for shortness of breath, Abdomen/GI:        Negative for abdominal pain, GU: Positive for Increased thirst,        increased urination, Negative for burning with urination, Skin:        Positive for diaphoresis, Negative for rash, Neuro: Negative        for headache. Attending Exam: My personal exam reveals        Constitutional: Appears comfortable. Head: NC/AT. Mouth: Eroded        teeth #24, 23, 25. Tooth  percussion tenderness #23, 22. 17-21        are absent. Similar absence on other side. Several teeth absent        in upper palate. Sublingual space is nl. No obvious abscess.        Neck: Supple, no adenopathy. Pulmonary: Expiratory wheeze on        left side. CVS: RRR, no murmurs. Abdomen: Soft, non-tender,        non-distended, active bowel signs. Neuro: A/Ox3, full English        language, lucid mentation. Lab/Ancillary show: EKG interpreted        by me and shows: EKG: NSR, no acute ST-T wave changes.  11:32 Lab/Ancillary show: Labs were reviewed and interpreted by me:   bb/lh  23        Glucose 467 and 308. ED Course: Pt with PMHx NIDDM, previously  on medications but not currently, presents for dental pain and        hyperglycemic symptoms evaluation. Pt was provided pain control        for his dental plan and he already has appointments scheduled        with his dentist and oral surgeon. Pt glucose today were 467        and 308. We had a discussion about healthier, less sugar diet        alternatives for his DM management. Pt was given Metformin x 1        in the ED prior to discharge with instructions for Metformin        and to follow up with Endocrine for further management and        care. My Working Impression: 1) Hyperglycemia 2) Dental pain.        Scribe Scribe Chart Complete By signing my name below, I,        [name], attest that this documentation has been prepared        under the direction and in the presence of Dr. Ezzard Standing, MD.        Electronically signed by [name] 12/23/2019.  [name] Kitchen  ED Observation:  10:25 REFER PATIENT TO ED OBSERVATION STATUS. The patient was         bb/lh  23        informed of the need for further observational care. Diagnosis:        hyperglycemia. Reason for ED Observation: At the time of        referral to ED Observation status, a more precise diagnosis is        needed and further observation and testing is required for        diagnostic accuracy.  TREATMENT PLAN: IV Hydration Pain Control.  10:26 Family history Father has/had DM.                               bb/lh  23  .  [name], [name]  [id number]  [id number]  Page 4 of 8  %%PAGE  .  Name: [name], [name]  MRN: [id number]  Age: 19 yrs  Sex: Male  DOB: 1975/10/13  Arrival Date: 12/23/2019  Arrival Time: 07:30  Account#: [id number]  .  Working Diagnosis: Hyperglycemia, unspecified,   - Dental pain  PCP:  .  10:55 Progress Note: Pt with PMHx NIDDM previously on medications but bb/lh  23        no currently presents for evaluation of dental pain and        hyperglycemic symptoms. He arrived in stable condition, alert        and oriented.  11:00 Progress Note: Pt continues to do well, we had a discussion of  bb/lh  23        healthier diet alternatives to manage his blood sugar levels.  11:34 Observation discharge summary: Clinical Course: Pt with PMHx    bb/lh  23        NIDDM, previously on medications but not currently, presents        for dental pain and hyperglycemic symptoms evaluation. Pt was        provided pain control for his dental plan and he already has        appointments scheduled with his dentist and oral surgeon. Pt        glucose today were  467 and 308. We had a discussion about        healthier, less sugar diet alternatives for his DM management.        Pt was given Metformin x 1 in the ED prior to discharge with        instructions for Metformin and to follow up with Endocrine for        further management and care My exam shows: Constitution:        Patient is afebrile. Non-toxic and well hydrated. Diagnosis: 1)        Hyperglycemia 2) Dental pain Disposition: Patient will be        discharged from the emergency department.  .  Disposition Summary:  12/23/19 11:26  Discharge Ordered        Location: Home -(12/23/19 11:26)                                am47        Problem: new(12/23/19 11:26)                                    am47        Symptoms: have improved(12/23/19 11:26)                          am47        Condition: Stable(12/23/19 11:26)                               am47        Diagnosis          - Hyperglycemia, unspecified(12/23/19 11:26)                  am47          - Dental pain                                                 am47        Followup:                                                       am47          - With: Endocrinology, [place]          - When: 3 - 5 days          - Reason: Continuance of care        Discharge Instructions:          - Discharge Summary Sheet                                     am47          - DIABETIC HYPERGLYCEMIA  am47        Forms:          - Medication Reconciliation Form                              am47          - Fax Summary                                                 am47        Prescriptions:          - Metformin 500 mg Oral tablet extended release 24 hr  .  [name], [name]  [id number]  [id number]  Page 5 of 8  %%PAGE  .  [name], [name]  MRN: [id number]  Age: 89 yrs  Sex: Male  DOB: 1975/09/21  Arrival Date: 12/23/2019  Arrival Time: 07:30  Account#: [id number]  .  Working Diagnosis: Hyperglycemia, unspecified,   - Dental pain  PCP:  .              - take 1 tablet by ORAL route once daily with evening     am47        meal; 40 tablet; Refills: 0, Product Selection Permitted          - Clindamycin HCl 300 mg Oral Capsule              - take 1 capsule by ORAL route every 6 hours for 7 days;  am47        28 capsule; Refills: 0, Product Selection Permitted          - Percocet 5-325 mg Oral Tablet              - take 1 tablet by ORAL route every 4 hours as needed     am47        Patient may fill less than prescribed number of tablets; 8        tablet; Refills: 0, Product Selection Permitted  Signatures:  Dispatcher, [employer]                          dispa  [place], Melissa                             mm12  Audley Hose  [place], Amy                            PA-C am47  [name]                               [address] [place]                          RN   tr11  Melanee Left                            RN  Paulette Blanch, Domenic                       RN   dn8  [place], Amy                             RN   ac39  [name], Scribe                   [phone]  Rosaria Ferries RN                             [phone]  .  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 Pt localizes pain closer to right front        teeth, has been using Tylenol and topical lidocaine. Pt reports        he is scheduled to see his dentist on Monday to get XRs and see        oral surgeon on Monday/Wednesday to get front teeth removed. Pt        states unable to take ibuprofen, cites hives/throat close. Pt        also reports hyperglycemic symptoms - was previously on 7030        insulin and, regular insulin, Metformin. Pt states came off        metformin 1 tablet qd 12/2018 after he lost weight - notes lost        20-25lbs in past 6 months. Pt notes he had a PCP at [facility]        who retired, pt is on Medicare so is waiting to be assigned a        new provider. Pt endorses increased thirst, increased        urination, blurred vision. Denies fever, sore throat, HA, CP,  .  [name], [name]  [id number]  [id number]  Page 6 of 8  %%PAGE  .  Name: [name], [name]  MRN: [id number]  Age: 70 yrs  Sex: Male  DOB: 16-Oct-1975  Arrival Date: 12/23/2019  Arrival Time:  07:30  Account#: [id number]  .  Working Diagnosis: Hyperglycemia, unspecified,   - Dental pain  PCP:  .        SOB, abd pain, dysuria, rashes, discharge in mouth. Pt went to        [facility] 1.5 week ago for hyperglycemia, was reportedly told because        his blood sugar was >500, "we can't help you." Pt is currently  on metoprolol, baby aspirin, Lipitor. Pt denies recent abx. In        the last 24h, patient reports eating chicken, steak, asparagus.        Pt states he typically drinks juice (apple, cranberry) and        water, occasional soda [facility]; diet drinks). 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 Pt localizes pain closer to right front        teeth, has been using Tylenol and topical lidocaine. Pt reports        he is scheduled to see his dentist on Monday to get XRs and see        oral surgeon on Monday/Wednesday to get front teeth removed. Pt        states unable to take ibuprofen, cites hives/throat close. Pt        also reports hyperglycemic symptoms - was previously on 7030        insulin and, regular insulin, Metformin. Pt states came off        metformin 1 tablet qd 12/2018 after he lost weight - notes lost        20-25lbs in past 6 months. Pt notes he had a PCP at [facility]        who retired, pt is on Medicare so is waiting to be assigned a        new provider. Pt endorses increased thirst, increased        urination, blurred vision. Denies fever, sore throat, HA, CP,        SOB, abd pain, dysuria, rashes, discharge in mouth. Pt went to        [facility] 1.5 week ago for hyperglycemia, was reportedly told because        his blood sugar was >500, "we can't help you." Pt is currently        on metoprolol, baby aspirin,  Lipitor. Pt denies recent abx. In        the last 24h, patient reports eating chicken, steak, asparagus.        Pt states he typically drinks juice (apple, cranberry) and        water, occasional soda [facility]; diet drinks) [id number]  08:41 08:32 Attending Exam: My personal exam reveals Constitutional:  bb/lh  23        Appears comfortable. Head: NC/AT. Mouth: Eroded teeth #24, 23,        25. Tooth percussion #23, 22. 17-21 are absent. Similar absence        on other side. Several teeth absent in upper palate. Sublingual        space is nl. No obvious abscess. Neck: Supple, no adenopathy.        Pulmonary: Expiratory wheeze on left side. CVS: RRR, no        murmurs. Abdomen: Soft, non-tender, non-distended, active bowel        signs. Neuro: A/Ox3, full English language, lucid mentation        [id number]  10:00 09:36 am47  kb30  10:26 10:25 TREATMENT PLAN: IV Hydration [id number]                      bb/lh  23  10:27 10:25 TREATMENT PLAN: IV Hydration UR/[id number]                      bb/lh  23  .  [name], [name]  [id number]  [id number]  Page 7 of 8  %%PAGE  .  [name], Dome  MRN: [id number]  Age: 66 yrs  Sex: Male  DOB: 1975-06-09  Arrival Date: 12/23/2019  Arrival Time: 07:30  Account#: [id number]  .  Working Diagnosis: Hyperglycemia, unspecified,   - Dental pain  PCP:  .  11:25 09:36 Observation am47                                          am47  11:25 09:36 [place], Brien am47                                     am47  11:25 09:36 ED Obsv am47                                              am47  11:25 09:36 Stable am47                                               am47  11:25 09:36 new am47                                                  am47  11:25 09:36 are unchanged am47                                        am47  11:25 09:36 Regular am47                                              am47  11:25 09:36 Hyperglycemia, unspecified am47                            am47  11:25 10:00 ED Obs kb30                                               am47  11:40 08:16 Attending HPI: 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 Pt localizes pain closer to right front        teeth, has been using Tylenol and topical lidocaine. Pt reports        he is scheduled to see his dentist on Monday to get XRs and see        oral surgeon on Monday/Wednesday to get front teeth removed. Pt        states unable to take ibuprofen, cites hives/throat close. Pt        also reports hyperglycemic symptoms - was previously on 70/30        insulin and, regular insulin, Metformin. Pt states came off        metformin 1 tablet qd 12/2018 after he lost weight - notes lost        20-25lbs in past 6 months. Pt notes he had a PCP at [facility]        who retired, pt is on Medicare so is waiting to be assigned a        new provider. Pt endorses increased thirst, increased        urination, blurred vision. Denies fever, sore throat, HA, CP,        SOB, abd pain, dysuria, rashes, discharge in mouth. Pt went to        [facility] 1.5 week ago for hyperglycemia, was reportedly told because        his blood sugar was not >500, "we can't help you." Pt is        currently on metoprolol, baby aspirin, Lipitor. Pt denies        recent abx. In the last 24h, patient reports eating chicken,        steak, asparagus. Pt states he typically drinks juice (apple,        cranberry) and water, occasional soda [facility]; diet        drinks) [id number]  .  Document is preliminary until electronically or manually signed by the atte  nding physician  .  .  .  .  .  .  .  .  Name:Nonaka, Cleve  [id number]  [id number]  Page 8 of 8  .  %%END

## 2019-12-23 NOTE — ED Provider Notes (Signed)
 [name]  Name: [name], [name]  MRN: [id number]  Age: 44 yrs  Sex: Male  DOB: [birth year]  Arrival Date: 12/23/2019  Arrival Time: 07:30  Account#: [id number]  Bed A3  PCP:  Chief Complaint: Dental Problem  .  Presentation:  08/04  07:35 Presenting complaint: Patient states: lower front tooth dental  em21        pain x 2-days. Pt c/o feeling diaphoretic and worried about his        blood sugar. FS in triage 486. Pt is currently not on diabetes        medications.  07:35 Method Of Arrival: Walk In                                      em21  07:35 Acuity: Adult 3                                                 em21  .  Historical:  - Allergies:  07:36 Coconut;                                                        em21  07:36 Codeine;                                                        em21  07:36 Ibuprofen;                                                      em21  07:36 Motrin;                                                         em21  07:36 NTG (drops his BP);                                             em21  07:36 Toradol;                                                        em21  07:36 Tramadol HCl;  em21  - Home Meds:  07:37 aspirin 81 mg Oral chew 1 tab once daily [Active]; Lipitor Oral em21        [Active]; Metoprolol Tartrate Oral [Active];  - PMHx:  07:36 CAD; High Cholesterol; mi x3; NIDDM;                            em21  - PSHx:  07:36 Cardiac Bypass Surgery x2; gallbladder surgery;                 em21  .  - Social history: Smoking status: Patient uses tobacco    products, current every day smoker. Patient/guardian denies    using alcohol, street drugs.  .  .  Screening:  07:37 SEPSIS SCREENING - Temp > 38.3 or < 36.0 No - Heart Rate > 90   em21        Yes - Respiratory > 20 No - SBP < 90 No SIRS Criteria (> = 2)        No. Exposure Risk/Travel Screening: COVID Symptomsquestion None. Known        COVID 19 exposurequestion No. DPH requests  Isolationquestion(COVID) No. Have        you tested + for COVIDquestion No. COVID 19 Vaccinequestion Yes-patient        states they completed COVID vaccine recommendations over 2        weeks ago.  .  Vital Signs:  07:32 BP 120 / 87; Pulse 115; Resp 16; Temp 36.9(O); Pulse Ox 98% on  em22  .  Name:[name], [name]  [id number]  [id number]  Page 1 of 4  %%PAGE  .  Name: [name], [name]  MRN: [id number]  Age: 68 yrs  Sex: Male  DOB: Feb 02, 1976  Arrival Date: 12/23/2019  Arrival Time: 07:30  Account#: [id number]  Bed A3  PCP:  Chief Complaint: Dental Problem  .        R/A;  09:13 Pulse 80; Resp 18; Pulse Ox 96% ;                               tr11  11:16 BP 126 / 67; Pulse 67; Resp 15; Temp 37.0; Pain 0/10;           ac39  .  Glasgow Coma Score:  07:37 Eye Response: spontaneous(4). Verbal Response: oriented(5).     em21        Motor Response: obeys commands(6). Total: 15.  11:16 Eye Response: spontaneous(4). Verbal Response: oriented(5).     ac39        Motor Response: obeys commands(6). Total: 15.  .  Triage Assessment:  07:37 General: Appears uncomfortable, well nourished, well groomed,   em21        Behavior is cooperative. Pain: Complains of pain in lower left        central incisor and lower right central incisor the pain        radiates to the: Pain does not radiate. Pain currently is 8/10.        Respiratory: No deficits noted.  .  Assessment:  11:16 Reassessment: assumed care. blood sugar re-checked, color and   ac39        work of breathing WDL. call bell within reach, waiting addition        updated orders. Skin: No deficits noted.  11:54 Reassessment: IV removed, opportunity to ask/answer questions,  tr11  given Metformin IV removed told to F/U today for apt with        Endocrinology. color and work of breathing WDL, ambulated out        of ED without difficult.  .  Observations:  07:30 Patient arrived in ED.                                          km51  07:36 Triage Completed.                                                em21  07:43 Patient Visited ByRigoberto Noel, Amy                               [place]  07:51 Patient Visited By: [name]                              jd39  08:14 Patient Visited By: [name], Scribe                       bb/lh  86  08:38 Registration completed.                                         jd39  10:00 Patient assigned to A3                                          kb30  11:15 Patient Visited By: [name]  .  Procedure:  08:10 EKG done. (by ED staff). Old EKG Obtained.                      cc38  08:45 Inserted Ultrasound guidance was utilized to place peripheral   tr11        IV. 20 gauge In left Forearm.  .  Dispensed Medications:  .  Name:Cinelli, [name]  [id number]  [id number]  Page 2 of 4  %%PAGE  .  Name: [name], [name]  MRN: [id number]  Age: 52 yrs  Sex: Male  DOB: [birth year]  Arrival Date: 12/23/2019  Arrival Time: 07:30  Account#: [id number]  Bed A3  PCP:  Chief Complaint: Dental Problem  .  08:38 Drug: NS - Sodium Chloride 0.9% IV ml 1000 mL Route: IV; Rate:  dn8        Bolus;  09:08 Drug: Percocet 1 PO - oxyCODONE-acetaminophen 5 mg-325 mg 1     tr11        tabs Route: PO;  10:06 Drug: NS - Sodium Chloride 0.9% IV ml 1000 mL Route: IV; Rate:  dn8        Bolus;  10:09 Drug: Insulin Regular Human 10 units {Co-Signature: tr11 (Tevin dn8  Zada Finders).} Route: IV;  11:55 Drug: metFORMIN 1000 mg Route: PO;                              tr11  .  [name]  Interventions:  08:14 POC Test Accucheck POC is 467 RN notified of POC results.       em22  08:18 ECG/EKG Scanned into Chart                                      mm12  09:16 Urine DipResults: Specific Gravity 1.010 Ph- 5 Leukocytes       tr11        Negative. Nitrites- Negative. Protein Negative Glucose- +++        Ketones Negative. Urobilinogen Negative Bilirubin Negative        Blood- Negative.  09:28 Demo Sheet Scanned into Chart                                    ib2  .  Outcome:  09:36 Decision to Hospitalize by Provider.                            am47  11:25 Hospitalize undone.                                             am47  11:26 Discharge ordered by MD.                                        QV79  11:56 Patient left the ED.                                            tr11  .  Corrections: (The following items were deleted from the chart)  07:37 07:36 Allergies: Morphine; em21                                 em21  07:39 07:35 Presenting complaint: Patient states: lower front tooth   em21        dental pain x 2-days em21  07:39 07:35 Acuity: Adult 4 em21                                      em21  .  Signatures:  Jenne Campus, Melissa                             mm12  [place], Katherine                          kb30  [place], [place]  PA-C am47  Bankowski, Izzy                              ib2  [name]                               [address], [place]                          RN   tr11  Melanee Left                            RN   em21  Couillard, Perrinton                      CCT  cc38  Shyrl Numbers, Domenic                       RN   dn8  .  Name:Persico, Dearis  [id number]  [id number]  Page 3 of 4  %%PAGE  .  Name: Normon, Pettijohn  MRN: [id number]  Age: 19 yrs  Sex: Male  DOB: 08-Jul-1975  Arrival Date: 12/23/2019  Arrival Time: 07:30  Account#: [id number]  Bed A3  PCP:  Chief Complaint: Dental Problem  .  Izora Ribas                         CCT  [address] [place]                             km51  [place], [place]                             RN   ac39  [name], Scribe                   [phone]  Rosaria Ferries RN                             [phone]  .  .  .  .  .  .  .  .  .  .  .  .  .  .  .  .  .  .  .  .  .  .  .  .  .  .  .  .  .  .  .  .  .  .  .  .  .  .  Name:Merta, Quinlan  [id number]  [id number]  Page 4 of 4  .  %%END

## 2019-12-24 ENCOUNTER — Emergency Department (HOSPITAL_BASED_OUTPATIENT_CLINIC_OR_DEPARTMENT_OTHER): Payer: No Typology Code available for payment source

## 2019-12-24 ENCOUNTER — Other Ambulatory Visit: Payer: Self-pay

## 2019-12-24 ENCOUNTER — Encounter (HOSPITAL_BASED_OUTPATIENT_CLINIC_OR_DEPARTMENT_OTHER): Payer: Self-pay

## 2019-12-24 ENCOUNTER — Emergency Department
Admission: EM | Admit: 2019-12-24 | Discharge: 2019-12-24 | Discharge status: Against Medical Advice | Payer: No Typology Code available for payment source | Source: Intra-hospital | Attending: Emergency Medicine | Admitting: Emergency Medicine

## 2019-12-24 DIAGNOSIS — R0989 Other specified symptoms and signs involving the circulatory and respiratory systems: Secondary | ICD-10-CM

## 2019-12-24 DIAGNOSIS — R079 Chest pain, unspecified: Secondary | ICD-10-CM | POA: Insufficient documentation

## 2019-12-24 DIAGNOSIS — R739 Hyperglycemia, unspecified: Secondary | ICD-10-CM

## 2019-12-24 DIAGNOSIS — Z20822 Contact with and (suspected) exposure to covid-19: Secondary | ICD-10-CM | POA: Diagnosis not present

## 2019-12-24 DIAGNOSIS — E1165 Type 2 diabetes mellitus with hyperglycemia: Secondary | ICD-10-CM | POA: Diagnosis not present

## 2019-12-24 DIAGNOSIS — I10 Essential (primary) hypertension: Secondary | ICD-10-CM | POA: Insufficient documentation

## 2019-12-24 LAB — BMP (EXT)
Anion Gap (EXT): 15 mmol/L (ref 5–15)
BUN (EXT): 10 mg/dL (ref 7–18)
CO2 (EXT): 21 mmol/L (ref 21–32)
CalciumCalcium (EXT): 9 mg/dL (ref 8.5–10.1)
Chloride (EXT): 101 mmol/L (ref 98–107)
Creatinine (EXT): 1.2 mg/dL (ref 0.7–1.2)
Glucose (EXT): 628 mg/dL — CR (ref 74–160)
Potassium (EXT): 4.9 mmol/L (ref 3.5–5.1)
Sodium (EXT): 137 mmol/L (ref 136–145)
eGFR - Creat CKD-EPI (EXT): >60 mL/min (ref >60)

## 2019-12-24 LAB — BASIC METABOLIC PANEL
ANION GAP: 15 mmol/L (ref 5–15)
BUN (UREA NITROGEN): 10 mg/dL (ref 7–18)
CALCIUM: 9 mg/dL (ref 8.5–10.1)
CARBON DIOXIDE: 21 mmol/L (ref 21–32)
CHLORIDE: 101 mmol/L (ref 98–107)
CREATININE: 1.2 mg/dL (ref 0.7–1.2)
ESTIMATED GLOMERULAR FILT RATE: >60 mL/min (ref >60)
Glucose Random: 628 mg/dL (ref 74–160)
POTASSIUM: 4.9 mmol/L (ref 3.5–5.1)
SODIUM: 137 mmol/L (ref 136–145)

## 2019-12-24 LAB — CBC, PLATELET & DIFFERENTIAL
ABSOLUTE BASOPHIL COUNT MANUAL: 0 10*3/uL (ref 0.0–0.1)
ABSOLUTE EO COUNT MANUAL: 0.1 10*3/uL (ref 0.0–0.8)
ABSOLUTE LYMPH COUNT MANUAL: 1.5 10*3/uL (ref 0.6–5.9)
ABSOLUTE MONOCYTE COUNT MANUAL: 0.3 10*3/uL (ref 0.2–1.4)
ABSOLUTE NEUT COUNT MANUAL: 3.4 THuL (ref 1.6–8.3)
ABSOLUTE NRBC COUNT: 0 10*3/uL (ref 0.0–0.0)
BASOPHILS %: 0 % (ref 0.0–1.2)
EOSINOPHILS %: 2 % (ref 0.0–7.0)
HEMATOCRIT: 41.5 % (ref 40.1–51.0)
HEMOGLOBIN: 13.5 g/dL — ABNORMAL LOW (ref 13.7–17.5)
LYMPHOCYTES %: 28 % (ref 15.0–54.0)
MEAN CORP HGB CONC: 32.5 g/dL (ref 31.0–37.0)
MEAN CORPUSCULAR HGB: 30.7 pg (ref 26.0–34.0)
MEAN CORPUSCULAR VOL: 94.3 fl (ref 80.0–100.0)
MEAN PLATELET VOLUME: 11.6 fL (ref 8.7–12.5)
MONOCYTES %: 6 % (ref 4.0–13.0)
NRBC %: 0 % (ref 0.0–0.0)
NUCLEATED RED BLOOD CELLS: 0 /100 WC (ref 0.0–0.0)
PLATELET COUNT: 191 10*3/uL (ref 150–400)
PLATELET ESTIMATE: NORMAL
POLYMORPHONUCLEAR (SEGS) %: 64 % (ref 40.0–75.0)
RBC DISTRIBUTION WIDTH STD DEV: 43.6 fL (ref 35.1–46.3)
RED BLOOD CELL COUNT: 4.4 M/uL — ABNORMAL LOW (ref 4.60–6.10)
WHITE BLOOD CELL COUNT: 5.3 10*3/uL (ref 4.0–11.0)

## 2019-12-24 LAB — TROPONIN I
TROPONIN I: <0.02 ng/mL (ref 0.00–0.04)
TROPONIN I: <0.02 ng/mL (ref 0.00–0.04)

## 2019-12-24 LAB — HEPATIC FUNCTION PANEL
ALANINE AMINOTRANSFERASE: 140 U/L — ABNORMAL HIGH (ref 12–45)
ALBUMIN: 3.4 g/dL (ref 3.4–5.0)
ALKALINE PHOSPHATASE: 77 U/L (ref 45–117)
ASPARTATE AMINOTRANSFERASE: 62 U/L — ABNORMAL HIGH (ref 8–34)
BILIRUBIN DIRECT: 0.1 mg/dl (ref 0.0–0.2)
BILIRUBIN TOTAL: 0.3 mg/dL (ref 0.2–1.0)
INDIRECT BILIRUBIN: 0.2 mg/dL (ref 0.2–0.9)
TOTAL PROTEIN: 7.1 g/dL (ref 6.4–8.2)

## 2019-12-24 LAB — BLOOD SUGAR FINGERSTICK (POINT OF CARE)
FINGERSTICK GLUCOSE: >600 mg/dl (ref 74–160)
FINGERSTICK GLUCOSE: 481 mg/dl — ABNORMAL HIGH (ref 74–160)
FINGERSTICK GLUCOSE: 409 mg/dl — ABNORMAL HIGH (ref 74–160)

## 2019-12-24 LAB — COVID-19 INPATIENT: COVID-19 INPATIENT: NEGATIVE

## 2019-12-24 MED ORDER — INSULIN REGULAR HUMAN 100 UNIT/ML IJ SOLN
10.00 [IU] | Freq: Once | INTRAMUSCULAR | Status: AC
Start: 2019-12-24 — End: 2019-12-24
  Administered 2019-12-24: 10 [IU] via INTRAVENOUS
  Filled 2019-12-24: qty 0.01

## 2019-12-24 MED ORDER — MORPHINE SULFATE 10 MG/ML IV SOLN (SUPER ERX)
8.0000 mg | Freq: Once | Status: AC
Start: 2019-12-24 — End: 2019-12-24
  Administered 2019-12-24: 8 mg via INTRAVENOUS
  Filled 2019-12-24: qty 1

## 2019-12-24 MED ORDER — SODIUM CHLORIDE 0.9 % IV BOLUS
1000.0000 mL | Freq: Once | INTRAVENOUS | Status: AC
Start: 2019-12-24 — End: 2019-12-24
  Administered 2019-12-24: 1000 mL via INTRAVENOUS

## 2019-12-24 MED ORDER — MORPHINE SULFATE 4 MG/ML IV SOLN (SUPER ERX)
4.0000 mg | Freq: Once | Status: AC
Start: 2019-12-24 — End: 2019-12-24
  Administered 2019-12-24: 4 mg via INTRAVENOUS
  Filled 2019-12-24: qty 1

## 2019-12-24 NOTE — Narrator Note (Signed)
Unable to obtain green top tube for VBG, provider aware

## 2019-12-24 NOTE — Progress Notes (Signed)
CASE MANAGEMENT SCREENING:   Proceed to assessment:: No      Patient had a fight with his girlfriend and eloped ED.     Called patient who reports he is walking to another hospital. Encouraged patient to return to ED. He reports he talked to his mother and she told him to "stay away from that crazy bitch" He explains that she is still in front of ED. Explained we could have her removed so he could return. He reports he is walking to [facility] urgent care.    Patient explains that she is from another state. That he sent her the money to come here 350.00. That he knows he is sick and will get care.     Encouraged patient to call 911 if and concerning s/s. Patient is aware. PA is aware.

## 2019-12-24 NOTE — Narrator Note (Signed)
Patient is resting comfortably.  Requesting additional diet gingerale  Provided per request

## 2019-12-24 NOTE — Narrator Note (Signed)
Tech at bedside, able to draw blood work and send to lab  Pt resting comfortably, medicated for severe pain  Wife at bedside for support

## 2019-12-24 NOTE — Narrator Note (Signed)
This RN at bedside to complete MRI screening form, pt states "I have a clamp and wires in my chest from the heart surgery"  Pt unsure if there are metal components, unsure if there are stents present  This information was relayed to the MRI tech who states ED MD must review this information to proceed

## 2019-12-24 NOTE — Narrator Note (Signed)
Patient is resting comfortably.  Requesting additional pain medication, will make provider aware

## 2019-12-24 NOTE — ED Triage Note (Signed)
Pt self presents to ED reporting "squeezing" L chest pain that radiates to L chest and L jaw  + nausea, +dizziness  Pt states his PCP d/c all of his T2D medications "they said I don't have diabetes anymore"  Pt has large vertical surgical scar to his anterior chest, reports "3 heart attacks and 2 bypasses"

## 2019-12-24 NOTE — ED Provider Notes (Signed)
EMERGENCY DEPARTMENT PHYSICIAN ASSISTANT NOTE    The ED nursing record was reviewed.   The prior medical records as available electronically through Epic were reviewed.    This patient was seen with Emergency Department attending physician Dr. Delsa GranaLai-Becker    CHIEF COMPLAINT    Patient presents with:  Chest Pain      HPI    [name] is a 44 year old male with a PMH of MI x 3 s/p CABG x2, DM 2, HTN, HLD, hepatitis C, DVT presenting today with left sided chest pain for 30 minutes.  Patient states that he was at rest when he began feeling squeezing left-sided chest pain that radiated down his arm and up into his jaw. Also has associated diaphoresis and nausea.   States that this feels similar to his previous MIs.  Is not exertional.  He states that he also had a feeling of lightheadedness and that he was going to pass out.  He states that he has a history of syncopal episodes.  Patient also states he is a diabetic but was taken off his insulin as "my doctor said my diabetes was cured".  Patient states that he presented today because "I feel like I am going to die".  He took full dose aspirin as he does every day.  He states that he cannot take nitroglycerin because it causes him to become too hypotensive. Denies any fever, chills, vomiting, abdominal pain, back pain, shortness of breath, cough, hemoptysis, lower extremity swelling.    On review of chart, he has multiple presentations (5 in the past month) at OSH for these symptoms. Patient underwent a cardiac catheterization in 09/2018 which was normal but did have 3 previous MIs and underwent cardiac surgery twice.  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  Care Everywhere shows approximately 15 different hospitals that the patient has had similar presentations.       REVIEW OF SYSTEMS    The pertinent positives are reviewed in the HPI above. All other systems were reviewed and are negative.    PAST MEDICAL HISTORY    Past Medical History:  No  date: Acute myocardial infarction, unspecified site, episode of   care unspecified  No date: CAD (coronary artery disease)  No date: Hypercholesteremia  No date: MI (myocardial infarction) (HCC)  No date: Mitral valve prolapse    PROBLEM LIST  Patient Active Problem List:     Controlled Medication request      SURGICAL HISTORY    Past Surgical History:  2007: CABG W/ARTERIAL GRAFT SINGLE ARTERIAL GRAFT  No date: PR MINIMALLY INVASIVE DIRECT CO    CURRENT MEDICATIONS      Current Facility-Administered Medications:   .  sodium chloride 0.9 % IV bolus 1,000 mL, 1,000 mL, Intravenous, Once, Peter Kiewit SonsChristina Nawaal Alling, PA-C  .  Insulin Regular Human (HumuLIN R) injection 10 Units, 10 Units, Intravenous, Once, Melisa Providence CrosbyW Lai-Becker, MD    Current Outpatient Medications:   [name].  METOPROLOL TARTRATE OR, None Entered, Disp: , Rfl:   .  ASPIRIN 81 MG OR TABS, daily, Disp: , Rfl:   .  LIPITOR 20 MG OR TABS, daily, Disp: , Rfl:     ALLERGIES    Review of Patient's Allergies indicates:   Ketorolac trometham*    Hives   Morphine and related    Anaphylaxis   Morphine sulfate-na*        Comment:Pt. States can take dilaudid and other narcotics   Motrin [ibuprofen]  Hives    FAMILY HISTORY    History reviewed.  No pertinent family history.      SOCIAL HISTORY    Social History     Socioeconomic History   . Marital status: Single     Spouse name: Not on file   . Number of children: Not on file   . Years of education: Not on file   . Highest education level: Not on file   Occupational History   . Not on file   Tobacco Use   . Smoking status: Current Every Day Smoker     Packs/day: 0.25   . Smokeless tobacco: Never Used   Substance and Sexual Activity   . Alcohol use: Not on file   . Drug use: Not on file   . Sexual activity: Not on file   Other Topics Concern   . Not on file   Social History Narrative   . Not on file   Social Determinants of Health  Financial Resource Strain:     Difficulty of Paying Living Expenses:   Food Insecurity:      Worried About Programme researcher, broadcasting/film/video in the Last Year:     Barista in the Last Year:   Transportation Needs:     Freight forwarder (Medical):     Lack of Transportation (Non-Medical):   Physical Activity:     Days of Exercise per Week:     Minutes of Exercise per Session:   Stress:     Feeling of Stress :   Social Connections:     Frequency of Communication with Friends and Family:     Frequency of Social Gatherings with Friends and Family:     Attends Religious Services:     Active Member of Clubs or Organizations:     Attends Engineer, structural:     Marital Status:   Intimate Partner Violence:     Fear of Current or Ex-Partner:     Emotionally Abused:     Physically Abused:     Sexually Abused:       PHYSICAL EXAM      Vital Signs: BP 119/78   Pulse 91   Temp 98.5 F   Resp 17   SpO2 97%      Constitutional: Well-developed, Well-nourished, Non-toxic appearance. Speaking full sentences.  HEENT: NCAT, sclera anicteric.  Conjunctiva without injection.  Neck: Normal range of motion, non-tender, Supple with no meningismus; no stridor.   Skin: Warm and dry. No rash noted on exposed skin. Skin color and turgor normal.   Cardio.: RRR, Faint systolic-click heard. No RG's. Radial, PT pulses 2+ B/L;  No LE edema  Pulmonary: Normal BS's bilat., No tachypnea, wheezes, rales or rhonchi;  Non-labored w/o retractions or accessory muscle use, or tripoding  Abd. + BS throughout. Soft, NTND, No masses, rebound or guarding.   Musculoskeletal:  Moving all 4 extremities, No major deformities noted. Ambulatory with steady gait.  Neurological:  AOx3, No focal deficits noted.   Psychiatric: Appropriate for age and situation.        RESULTS  Results for orders placed or performed during the hospital encounter of 12/24/19 (from the past 24 hour(s))   Fingerstick Blood Sugar (Point of Care)    Collection Time: 12/24/19 12:49 PM   Result Value    FINGERSTICK GLUCOSE > 600 (*H)    COVID-19 Inpatient    Collection Time: 12/24/19  1:07 PM    Specimen: Nasal;  Swab   Result Value    COVID-19 INPATIENT      Negative for SARS-CoV-2(2019 novel coronavirus)by PCR  methodology.  Negative results do not preclude 2019-nCoV infection and  should not be used as the sole basis for patient management  decisions. Negative results must be combined with clinical  observations, patient history, and epidemiological  information.  This test has been authorized by the FDA under an Emergency  Use Authorization (EUA) for use by authorized laboratories.     CBC, Platelet & Differential    Collection Time: 12/24/19  1:20 PM   Result Value    WHITE BLOOD CELL COUNT 5.3    RED BLOOD CELL COUNT 4.40 (L)    HEMOGLOBIN 13.5 (L)    HEMATOCRIT 41.5    MEAN CORPUSCULAR VOL 94.3    MEAN CORPUSCULAR HGB 30.7    MEAN CORP HGB CONC 32.5    RBC DISTRIBUTION WIDTH STD DEV 43.6    PLATELET COUNT 191    MEAN PLATELET VOLUME 11.6    NRBC % 0.0    ABSOLUTE NRBC COUNT 0.0    POLYMORPHONUCLEAR (SEGS) % 64.0    LYMPHOCYTES % 28.0    MONOCYTES % 6.0    EOSINOPHILS % 2.0    BASOPHILS % 0.0    ABSOLUTE NEUT COUNT MANUAL 3.4    ABSOLUTE LYMPH COUNT MANUAL 1.5    ABSOLUTE MONOCYTE COUNT MANUAL 0.3    ABSOLUTE EO COUNT MANUAL 0.1    ABSOLUTE BASOPHIL COUNT MANUAL 0.0    NUCLEATED RED BLOOD CELLS 0    PLATELET ESTIMATE NORMAL    LARGE PLATELET PRESENT (A)   Basic Metabolic Panel    Collection Time: 12/24/19  1:20 PM   Result Value    SODIUM 137    POTASSIUM 4.9    CHLORIDE 101    CARBON DIOXIDE 21    ANION GAP 15    CALCIUM 9.0    Glucose Random 628 (*H)    BUN (UREA NITROGEN) 10    CREATININE 1.2    ESTIMATED GLOMERULAR FILT RATE > 60   Hepatic Function Panel    Collection Time: 12/24/19  1:20 PM   Result Value    TOTAL PROTEIN 7.1    ALBUMIN 3.4    BILIRUBIN TOTAL 0.3    BILIRUBIN DIRECT 0.1    INDIRECT BILIRUBIN 0.2    ALKALINE PHOSPHATASE 77    ASPARTATE AMINOTRANSFERASE 62 (H)    ALANINE AMINOTRANSFERASE 140 (H)   Troponin I    Collection  Time: 12/24/19  1:49 PM   Result Value    TROPONIN I < 0.02   Beta-Hydroxybutyrate    Collection Time: 12/24/19  1:49 PM   Result Value    BETA-HYDROXYBUTYRATE 1.36 (H)   Fingerstick Blood Sugar (Point of Care)    Collection Time: 12/24/19  2:57 PM   Result Value    FINGERSTICK GLUCOSE 481 (H)   Troponin I    Collection Time: 12/24/19  4:20 PM   Result Value    TROPONIN I < 0.02   Fingerstick Blood Sugar (Point of Care)    Collection Time: 12/24/19  4:52 PM   Result Value    FINGERSTICK GLUCOSE 409 (H)        RADIOLOGY  XR Chest Portable    Result Date: 12/24/2019  TECHNIQUE: Portable chest, 1:23 PM INDICATION: chest pain COMPARISON: 10/06/2018 FINDINGS: Quality: Satisfactory.   Tubes/lines: None. Lungs: Slight prominence of the pulmonary lung markings suggestive of mild pulmonary  edema. Pleura: There is no pleural effusion or pneumothorax. Heart: Cardiomegaly. Mediastinum/hila: Median sternotomy wires are present. Bones and Soft Tissues: Mild degenerative changes of the spine.   IMPRESSION: Mild pulmonary vascular congestion.   Reviewed and Electronically Signed by: Harolyn Rutherford MD Signed Date/Time: 12-24-2019 13:35:41        EKG:  Sinus rhythm with a ventricular rate of 87.  QTc 421.  No ST elevations or depressions.    MEDICATIONS ADMINISTERED ON THIS VISIT  Orders Placed This Encounter      sodium chloride 0.9 % IV bolus 1,000 mL      Insulin Regular Human (HumuLIN R) injection 10 Units      morphine injection 4 mg      sodium chloride 0.9 % IV bolus 1,000 mL      morphine injection 8 mg      ED COURSE & MEDICAL DECISION MAKING      I reviewed the patient's past medical history/problem list, past surgical history, medication list, social history and allergies.    ED Decision Making & Course: Pt is a 44 year old male presenting today with chest pain.  History and exam as above. Exam reassuring as he is well-appearing and vital signs are stable.    Initial presentation concerning for ACS versus other acute  cardiopulmonary pathology.  EKG and troponin did not show any evidence of cardiac ischemia or arrhythmia.  Serial troponin negative. Chest x-ray did not show some mild pulmonary edema but no other any acute cardiopulmonary processes. CBC, BNP, LFTs unremarkable.  Patient is not hypoxic or tachycardic, Wells score of 1.5. Do not believe further work-up for VTE is indicated at this time.     FSBS on arrival was noted to be >600.  No anion gap or elevation in carbon dioxide on BMP.  Potassium normal. Patient was given 10 units of insulin, morphine, and 2L of IV fluid.     Due to poor IV access, VBG was unable to be obtained.  RNs and IV team attempted to place a new IV but were unable to.  In addition, on MD evaluation patient stated that his pain radiated into his back which did raise some concern for dissection though there was no widening of the mediastinum on chest x-ray.  He had strong pulses in all extremities and no severe ripping pain.  However, felt that he would benefit from a CTA for further evaluation of his aorta as patient cannot do an MRI due to unclear surgical devices implanted in his body.  Once MD and I were preparing to place an ultrasound-guided IV, a code gray was called to the patient's room.    Patient was found in the hallway to be verbally aggressive towards staff and pulled out his IV stating that he was leaving.  Attempted to discuss with patient that we were concerned and that he should be admitted, but he walked out of the department.    Case management will reach out to the patient via phone and discussed with him that we were concerned about his medical status but he stated he would not be returning to [facility] as his girlfriend was still there and they were in a fight.  Patient was on foot and agreed to walk to be urgent care.  Encouraged to call 911 if his symptoms were concerning.  Patient voiced understanding.  We see case management note for further details.    Patient eloped  prior to thorough evaluation.  Pt  remained hemodynamically stable during their stay in the emergency department.      Disposition: Eloped  Condition (at time of disposition): Unknown  Diagnosis/diagnoses: Chest pain in adult        Carylon Perches  Physician Assistant  Emergency Department  [facility]      This Emergency Department patient encounter note was created using voice-recognition software and in real time during the ED visit. Please excuse any typographical errors that have not been edited out.

## 2019-12-24 NOTE — Narrator Note (Signed)
Pt resting on stretcher with headphones on, watching a movie on his cell phone, pt appears comfortable, pt tells this RN "I don't feel better, I still have pain"

## 2019-12-24 NOTE — Narrator Note (Signed)
MD at bedside to speak with patient and evaluate

## 2019-12-24 NOTE — Narrator Note (Signed)
Yelling heard from the patient's room, pt heard yelling and swearing at his wife who is at the bedside, pt became agitated and verbally aggressive  Yelling at this RN "take this IV out now, Im leaving"  Pt standing up, pulling off all monitoring, pt pulls IV from arm, getting blood on sheets and floor  Pt swearing at staff "Im getting the fuck out of here"  Pt leaves department at this time

## 2019-12-24 NOTE — Narrator Note (Signed)
Phlebotomy unable to obtain IV, 2 unsuccessful attempts  Will make MD aware

## 2019-12-24 NOTE — Narrator Note (Signed)
3 attempts to insert IV by 2 RN's  Unable to obtain all labs  Plan to page IV therapy  Portable CXR at bedside

## 2019-12-24 NOTE — Narrator Note (Signed)
IV therapy at bedside to attempt IV placement for CTA

## 2019-12-24 NOTE — Narrator Note (Signed)
Pt requesting pain medication, will make provider aware

## 2019-12-24 NOTE — Narrator Note (Signed)
Pt eloped, did not wait to speak with provider  Pt agitated, angry and threatening

## 2019-12-24 NOTE — Narrator Note (Signed)
2nd attempt to page phlebotomy

## 2019-12-24 NOTE — Narrator Note (Signed)
Pt changed out of clothing into gown, on cardiac monitor, EKG completed

## 2019-12-24 NOTE — Narrator Note (Signed)
Plan for CTA, pt does not have an IV that can accommodate the IV contrast, MD is aware, IV therapy paged at this time

## 2019-12-24 NOTE — Narrator Note (Signed)
Pt resting comfortably on stretcher, laughing, making jokes with his wife who is at the bedside

## 2019-12-24 NOTE — Narrator Note (Signed)
Pt medicated for reported severe pain

## 2019-12-24 NOTE — Narrator Note (Signed)
Pt states "Im really hungry so I need to eat something"  Will make provider aware

## 2019-12-24 NOTE — Narrator Note (Signed)
Pt resting comfortably on stretcher, legs crossed and perched up on the side rail, wearing headphones, watching movies on his cell phone  Pt given gingerale and ice per request

## 2019-12-25 LAB — BMP (EXT)
Anion Gap (EXT): 11 mmol/L (ref 7–17)
BUN (EXT): 11 mg/dL (ref 6–23)
CO2 (EXT): 23 mmol/L (ref 22–31)
CalciumCalcium (EXT): 8.7 mg/dL — ABNORMAL LOW (ref 8.8–10.7)
Chloride (EXT): 98 mmol/L (ref 98–107)
Creatinine (EXT): 0.96 mg/dL (ref 0.50–1.20)
GFR Estimated (Calc) (EXT): 97 mL/min/{1.73_m2} (ref >59)
Glucose (EXT): 609 mg/dL — CR (ref 70–100)
Potassium (EXT): 4.6 mmol/L (ref 3.4–5.1)
Sodium (EXT): 132 mmol/L — ABNORMAL LOW (ref 136–145)

## 2019-12-25 LAB — BETA-HYDROXYBUTYRATE: BETA-HYDROXYBUTYRATE: 1.36 mg/dL (ref 0.20–2.81)

## 2019-12-26 ENCOUNTER — Ambulatory Visit: Admitting: Internal Medicine

## 2019-12-26 LAB — EKG

## 2019-12-27 ENCOUNTER — Ambulatory Visit: Admitting: Internal Medicine

## 2020-01-04 LAB — COVID-19 AND INFLUENZA CARE EVERYWHERE
COVID-19 CARE EVERYWHERE: NOT DETECTED — NL
INFLUENZA A CARE EVERYWHERE: NOT DETECTED — NL
INFLUENZA B CARE EVERYWHERE: NOT DETECTED — NL

## 2020-01-05 LAB — BMP (EXT)
Anion Gap (EXT): 12 mmol/L (ref 2–15)
BUN (EXT): 14 mg/dL (ref 7–24)
CO2 (EXT): 24 mmol/L (ref 24–32)
CalciumCalcium (EXT): 10 mg/dL (ref 8.5–10.5)
Chloride (EXT): 104 mmol/L (ref 98–110)
Creatinine (EXT): 1.8 mg/dL — ABNORMAL HIGH (ref 0.6–1.3)
Glucose (EXT): 162 mg/dL — ABNORMAL HIGH (ref 70–118)
Potassium (EXT): 3.7 mmol/L (ref 3.4–5.2)
Sodium (EXT): 140 mmol/L (ref 135–146)
eGFR - Creat MDRD (EXT): 42 mL/min/BSA — ABNORMAL LOW (ref >=60)

## 2020-01-05 LAB — UNMAPPED LAB RESULTS

## 2020-01-05 LAB — HEMOGLOBIN A1C CARE EVERYWHERE
ESTIMATED AVERAGE GLUCOSE CARE EVERYWHERE: 292 mg/dL — NL
HEMOGLOBIN A1C CARE EVERYWHERE: 11.8 % — ABNORMAL HIGH (ref 4.6–5.6)

## 2020-01-26 LAB — COVID-19 CARE EVERYWHERE: COVID-19 CARE EVERYWHERE: NOT DETECTED

## 2020-02-11 ENCOUNTER — Emergency Department
Admission: EM | Admit: 2020-02-11 | Discharge: 2020-02-12 | Discharge status: Against Medical Advice | Payer: Self-pay | Source: Ambulatory Visit | Attending: Emergency Medicine | Admitting: Emergency Medicine

## 2020-02-11 DIAGNOSIS — R079 Chest pain, unspecified: Secondary | ICD-10-CM | POA: Insufficient documentation

## 2020-02-11 DIAGNOSIS — I517 Cardiomegaly: Secondary | ICD-10-CM

## 2020-02-11 DIAGNOSIS — Z789 Other specified health status: Secondary | ICD-10-CM

## 2020-02-11 DIAGNOSIS — R0789 Other chest pain: Secondary | ICD-10-CM

## 2020-02-11 DIAGNOSIS — Z5320 Procedure and treatment not carried out because of patient's decision for unspecified reasons: Secondary | ICD-10-CM | POA: Insufficient documentation

## 2020-02-11 LAB — CBC AND DIFFERENTIAL
Baso # K/uL: 0 10*3/uL (ref 0.0–0.1)
Basophil %: 0.5 %
Eos # K/uL: 0.1 10*3/uL (ref 0.0–0.5)
Eosinophil %: 2.1 %
Hematocrit: 45 % (ref 40–51)
Hemoglobin: 14.4 g/dL (ref 13.7–17.5)
IMM Granulocytes #: 0 10*3/uL (ref 0.0–0.0)
IMM Granulocytes: 0.3 %
Lymph # K/uL: 1.2 10*3/uL — ABNORMAL LOW (ref 1.3–3.6)
Lymphocyte %: 31.2 %
MCH: 30 pg (ref 26–32)
MCHC: 32 g/dL (ref 32–37)
MCV: 93 fL — ABNORMAL HIGH (ref 79–92)
Mono # K/uL: 0.3 10*3/uL (ref 0.3–0.8)
Monocyte %: 8.5 %
Neut # K/uL: 2.2 10*3/uL (ref 1.8–5.4)
Nucl RBC # K/uL: 0 10*3/uL (ref 0.0–0.0)
Nucl RBC %: 0 /100 WBC (ref 0.0–0.2)
Platelets: 230 10*3/uL (ref 150–330)
RBC: 4.9 MIL/uL (ref 4.6–6.1)
RDW: 12.7 % (ref 11.6–14.4)
Seg Neut %: 57.4 %
WBC: 3.8 10*3/uL — ABNORMAL LOW (ref 4.2–9.1)

## 2020-02-11 LAB — PLASMA PROF 7 (ED ONLY)
Anion Gap,PL: 14 (ref 7–16)
CO2,Plasma: 22 mmol/L (ref 20–28)
Chloride,Plasma: 99 mmol/L (ref 96–108)
Creatinine: 0.77 mg/dL (ref 0.67–1.17)
GFR,Black: 129 *
GFR,Caucasian: 112 *
Glucose,Plasma: 237 mg/dL — ABNORMAL HIGH (ref 60–99)
Potassium,Plasma: 3.6 mmol/L (ref 3.3–4.6)
Sodium,Plasma: 135 mmol/L (ref 133–145)
UN,Plasma: 8 mg/dL (ref 6–20)

## 2020-02-11 LAB — RUQ PANEL (ED ONLY)
ALT: 160 U/L — ABNORMAL HIGH (ref 0–50)
AST: 93 U/L — ABNORMAL HIGH (ref 0–50)
Albumin: 4.3 g/dL (ref 3.5–5.2)
Alk Phos: 72 U/L (ref 40–130)
Amylase: 49 U/L (ref 28–100)
Bilirubin,Direct: <0.2 mg/dL (ref 0.0–0.3)
Bilirubin,Total: 0.4 mg/dL (ref 0.0–1.2)
Lipase: 22 U/L (ref 13–60)
Total Protein: 7.3 g/dL (ref 6.3–7.7)

## 2020-02-11 LAB — TROPONIN T 0 HR HIGH SENSITIVITY (IP/ED ONLY): TROP T 0 HR High Sensitivity: 6 ng/L (ref 0–21)

## 2020-02-11 MED ORDER — ASPIRIN 81 MG PO CHEW *I*
324.0000 mg | CHEWABLE_TABLET | Freq: Once | ORAL | Status: AC
Start: 2020-02-11 — End: 2020-02-11
  Administered 2020-02-11: 324 mg via ORAL

## 2020-02-11 MED ORDER — ASPIRIN 81 MG PO CHEW *I*
CHEWABLE_TABLET | ORAL | Status: DC
Start: 2020-02-11 — End: 2020-02-12
  Filled 2020-02-11: qty 4

## 2020-02-11 MED ORDER — SODIUM CHLORIDE 0.9 % FLUSH FOR PUMPS *I*
0.0000 mL/h | INTRAVENOUS | Status: DC | PRN
Start: 2020-02-11 — End: 2020-02-12

## 2020-02-11 MED ORDER — DEXTROSE 5 % FLUSH FOR PUMPS *I*
0.0000 mL/h | INTRAVENOUS | Status: DC | PRN
Start: 2020-02-11 — End: 2020-02-12

## 2020-02-11 NOTE — ED Triage Notes (Addendum)
Pt complaining of chest pain that started 20 minutes prior to arrival. Pt states history of MIs and bypass grafts. Pt describes pain as squeezing and radiating from left jaw down arm.  324 mg ASA given in triage. EKG in triage.     Triage Note   Maree Krabbe, RN

## 2020-02-11 NOTE — First Provider Contact (Signed)
ED First Provider Contact Note    Initial provider evaluation performed by   ED First Provider Contact     Date/Time Event User Comments    02/11/20 2137 ED First Provider Contact Tamala Ser Initial Face to Face Provider Contact          Vital signs reviewed.    Assessment: 44 year old male with hx of CAD s/p CABG x2, presenting to ED with 20 minutes of left sided squeezing/stabbing chest pain that radiates to left jaw and shoulder. Current Pain symptoms are similar to prior MIs.     Orders placed:  Labs, EKG, Aspirin     Patient requires further evaluation.     Tamala Ser, NP, 02/11/2020, 9:37 PM     Tamala Ser, NP  02/11/20 2140

## 2020-02-12 LAB — HOLD BLUE

## 2020-02-12 NOTE — ED Notes (Signed)
Pt notified staff he was leaving, pt told importance of staying and told to come back if anything worsens. IV removed. Pt ambulated out of ED.

## 2020-02-23 LAB — EKG 12-LEAD
P: 55 deg
PR: 159 ms
QRS: 72 deg
QRSD: 101 ms
QT: 340 ms
QTc: 419 ms
Rate: 91 {beats}/min
T: 159 deg

## 2020-03-02 LAB — EGFR (EXT)
eGFR - Creat MDRD (EXT): >60 mL/min
eGFR - Creat MDRD (EXT): >60 mL/min

## 2020-04-21 LAB — LIPID PROFILE (EXT)
Chol/HDL Ratio (EXT): 7.6 — ABNORMAL HIGH (ref 0.0–4.9)
Cholesterol (EXT): 204 mg/dL — ABNORMAL HIGH (ref <200)
HDL Cholesterol (EXT): 27 mg/dL — ABNORMAL LOW (ref >39)
LDL Cholesterol (EXT): 145 mg/dL — ABNORMAL HIGH (ref <100)
Triglycerides (EXT): 162 mg/dL — ABNORMAL HIGH (ref 0–149)

## 2020-04-21 LAB — HEMOGLOBIN A1C
Estimated Average Glucose mg/dL (INT/EXT): 324 mg/dL
HEMOGLOBIN A1C % (INT/EXT): 12.9 % — ABNORMAL HIGH (ref <5.7)

## 2020-04-21 LAB — HEMOGLOBIN A1C CARE EVERYWHERE
ESTIMATED AVERAGE GLUCOSE CARE EVERYWHERE: 324 mg/dL
HEMOGLOBIN A1C CARE EVERYWHERE: 12.9 % — ABNORMAL HIGH (ref <5.7)

## 2020-04-21 LAB — ESTIMATED GLOMERULAR FILTRATION RATE CARE EVERYWHERE
ESTIMATED GFR AFRICAN AMERICAN CARE EVERYWHERE: >60
ESTIMATED GFR CARE EVERYWHERE: >60

## 2020-04-21 LAB — PT & PTT CARE EVERYWHERE
APTT CARE EVERYWHERE: 32.2 s (ref 27.6–37.5)
INR CARE EVERYWHERE: 1.1 (ref 0.9–1.1)
PROTHROMBIN TIME CARE EVERYWHERE: 12.3 s (ref 10.8–12.9)

## 2020-04-21 LAB — RESPIRATORY PANEL BASIC CARE EVERYWHERE
COVID-19 CARE EVERYWHERE: NEGATIVE
INFLUENZA A POC CARE EVERYWHERE: NEGATIVE
INFLUENZA B CARE EVERYWHERE: NEGATIVE
RSV CARE EVERYWHERE: NEGATIVE

## 2020-04-21 LAB — COVID-19 CARE EVERYWHERE
COVID-19 CARE EVERYWHERE: NOT DETECTED
COVID-19 TARGET 1 CARE EVERYWHERE: 0

## 2020-05-04 NOTE — Telephone Encounter (Signed)
msg left for patient to return my call regarding overdue labs and appointment.   Jenn Beaule CCMA  ext. [phone]

## 2020-05-16 ENCOUNTER — Ambulatory Visit: Admitting: Cardiovascular Disease

## 2020-05-18 ENCOUNTER — Ambulatory Visit: Admitting: Emergency Medicine

## 2020-05-18 ENCOUNTER — Emergency Department
Admit: 2020-05-18 | Disposition: A | Discharge status: Home / Self Care | Source: Home / Self Care | Attending: Emergency Medicine | Admitting: Emergency Medicine

## 2020-05-18 ENCOUNTER — Emergency Department
Admit: 2020-05-18 | Discharge: 2020-05-18 | Disposition: A | Discharge status: Home / Self Care | Payer: No Typology Code available for payment source

## 2020-05-18 NOTE — ED Provider Notes (Signed)
 [name]  Name: [name], [name]  MRN: [id number]  Age: 44 yrs  Sex: Male  DOB: 08-18-1975  Arrival Date: 05/18/2020  Arrival Time: 12:06  Account#: [id number]  .  Working Diagnosis: Dental root caries,  Dental caries, unspecified  PCP: None, Pcp  .  Historical:  - Allergies: Coconut; Codeine; Ibuprofen; Motrin; NTG (drops his BP);    Toradol; Tramadol HCl;  - Home Meds: aspirin 81 mg Oral chew 1 tab once daily; Lipitor Oral;    Metoprolol Tartrate Oral;  - PMHx: CAD; High Cholesterol; mi x3; NIDDM;  - PSHx: Cardiac Bypass Surgery x2; gallbladder surgery;  - Social history: Smoking status: Patient uses tobacco    products, current every day smoker. No barriers to    communication noted, The patient speaks fluent [place].  .  .  Vital Signs:  12/29  12:36 BP 116 / 69; Pulse 85; Resp 16; Temp 35.5(TE); Pulse Ox 94% on  em21        R/A; Pain 8/10;  14:48 Pulse 81; Resp 20; Temp 36.4; Pulse Ox 96% ;                    jt28  .  Glasgow Coma Score:  12:36 Eye Response: spontaneous(4). Verbal Response: oriented(5).     em21        Motor Response: obeys commands(6). Total: 15.  13:30 Eye Response: spontaneous(4). Verbal Response: oriented(5).     jt28        Motor Response: obeys commands(6). Total: 15.  .  MDM:  14:40 Medical screening is not applicable.                            jra  .  12/29  12:07 Order name: EDIE_CAREPLAN; Complete Time: 13:55                 dispa  t  12/29  12:07 Order name: PATIENTPING STORY; Complete Time: 13:55             dispa  t  .  Dispensed Medications:  14:47 Drug: Percocet 1 PO - oxyCODONE-acetaminophen 5 mg-325 mg 1     jt28        tabs Route: PO;  14:48 Drug: Penicillin VK 500 mg Route: PO;                           jt28  .  [name]  Attending Notes:  14:59 Attestation: Assessment and care plan reviewed with             jra        resident/midlevel provider. See their note for details.        Physician Assistant's history reviewed, patient interviewed and  .  [name],  [name]  [id number]  [id number]  Page 1 of 3  %%PAGE  .  [name], [name]  MRN: [id number]  Age: 18 yrs  Sex: Male  DOB: 08/09/1975  Arrival Date: 05/18/2020  Arrival Time: 12:06  Account#: [id number]  .  Working Diagnosis: Dental root caries,  Dental caries, unspecified  PCP: None, Pcp  .        examined. Attending HPI: HPI: Patient has dental pain, tooth        #24, low anterior, 1-2 days, no new traumas to the area, pain        is moderate, severe with eating or touching the  area, no other        associated symptoms. Attending ROS My personal review of system        reveals + Dental pain, swelling around the tooth, denies        fevers, chills, body aches, chest pain, neck pain, difficulty        breathing or swallowing All other systems are negative.        Attending Exam: My personal exam reveals - General General        appearance: alert, in no apparent distress - Eye Eye exam:        Absent: scleral icterus - ENT ENT exam: normal oropharynx        however there is very poor dentition and tooth #24 is fractured        with gingivitis surrounding, tooth #23 is also fractured with        cavitation, no abscess palpated in the submental area or        submandibular swelling, no facial cellulitis, no stridor,        breathing normally - Respiratory Respiratory exam: Present:        normal lung sounds bilaterally - Cardiovascular Cardiovascular        exam: Present: regular rate, slightly irregular rhythm -        Abdominal Exam Abdominal exam: Present: soft. Absent:        tenderness - Neurological Exam Neurological exam: Present:        alert, no facial droop, moving all fours spontaneously -        Psychiatric Psychiatric exam: Present: normal affect, normal        mood - Skin Skin exam: Present: warm, dry. I have reviewed        Nurses Notes, and I agree. ED Course: 44 year old male with a        history of CABG, diabetes, CAD, hypertension, hyperlipidemia,        presents with dental pain. Vitals are  normal. Exam is normal        aside from swelling around tooth #24 and 23. Workup/Treatments:        Penicillin, 1 dose of Percocet given for pain I have examined        the patient's current results: Labs and/or imaging: None My        working impression (and any other differential items if        pertinent): Dental pain, dental trauma, dental infection, no        evidence of facial cellulitis, Ludwig angina, abscess Final        plan and any consults if performed: Discharged to dental        clinic. Patient agreeable to plan. Patient received both        written and formal discharge instructions. Diagnosis and        treatment discussed. Attending chart complete and        electronically signed: [name], MD, FACEP [phone].  [name]  Disposition Summary:  05/18/20 14:40  Discharge Ordered        Location: Home -                                                jra        Problem: new  jra  .  [name], [name]  [id number]  [id number]  Page 2 of 3  %%PAGE  .  [name], [name]  MRN: [id number]  Age: 23 yrs  Sex: Male  DOB: 04-17-1976  Arrival Date: 05/18/2020  Arrival Time: 12:06  Account#: [id number]  .  Working Diagnosis: Dental root caries,  Dental caries, unspecified  PCP: None, Pcp  .        Symptoms: are resolved                                          jra        Condition: Stable                                               jra        Diagnosis          - Dental root caries                                          jra          - Dental caries, unspecified                                  jra        Followup:                                                       jra          - With: [facility] [place]          - When: Today          - Reason: Continuance of care        Discharge Instructions:          - Discharge Summary Sheet                                     jra          - Dental Pain                                                  jra          - DENTAL CAVITY                                               jra        Forms:          - Medication Reconciliation Form  jra          - Fax Summary                                                 jra        Prescriptions:          - penicillin V potassium 500 mg Oral Tablet              - take 1 tablet by ORAL route every 12 hours for 7 days;  jra        14 tablet; Refills: 0, Product Selection Permitted  Signatures:  [name]                         MD   jra  [place], Evalyn Casco                            RN   em21  [place], Panagiotis                     DDS  pm23  Ellamae Sia                       RN   jt28  .  Document is preliminary until electronically or manually signed by the atte  nding physician  .  .  .  .  .  .  .  .  .  .  .  .  .  .  .  [name], [name]  [id number]  [id number]  Page 3 of 3  .  %%END

## 2020-05-18 NOTE — ED Provider Notes (Signed)
[name]  Name: Earnestine, Tuohey  MRN: [id number]  Age: 44 yrs  Sex: Male  DOB: Apr 21, 1976  Arrival Date: 05/18/2020  Arrival Time: 12:06  Account#: [id number]  Bed E-Eye  PCP: None, Pcp  Chief Complaint:  - Mouth Pain  .  Presentation:  12/29  12:36 Presenting complaint: Patient states: "dental pain" Bottom      em21        front teeth beginning today, currently 8/10 pain.  12:36 Method Of Arrival: Walk In                                      em21  12:36 Acuity: Adult 4                                                 em21  .  Historical:  - Allergies:  12:36 Coconut;                                                        em21  12:36 Codeine;                                                        em21  12:36 Ibuprofen;                                                      em21  12:36 Motrin;                                                         em21  12:36 NTG (drops his BP);                                             em21  12:36 Toradol;                                                        em21  12:36 Tramadol HCl;                                                   em21  - Home Meds:  12:36 aspirin 81 mg Oral chew 1 tab once  daily [Active]; Lipitor Oral em21        [Active]; Metoprolol Tartrate Oral [Active];  - PMHx:  12:36 CAD; High Cholesterol; mi x3; NIDDM;                            em21  - PSHx:  12:36 Cardiac Bypass Surgery x2; gallbladder surgery;                 em21  .  - Social history: Smoking status: Patient uses tobacco    products, current every day smoker. No barriers to    communication noted, The patient speaks fluent [place].  .  .  Screening:  12:36 SEPSIS SCREENING - Temp > 38.3 or < 36.0 No - Heart Rate > 90   em21        No - Respiratory > 20 No - SBP < 90 No SIRS Criteria (> = 2)        No. Exposure Risk/Travel Screening: COVID Symptomsquestion None. Known        COVID 19 exposurequestion No. DPH requests Isolationquestion(COVID) No. Have        you tested + for COVIDquestion No. COVID 19  Vaccinequestion Yes-patient        states they completed COVID vaccine recommendations over 2        weeks ago.  13:30 Safety screen: Patient feels safe. Suicide (ED Safe) Screening: jt28        In the past two weeks have you felt down, depressed or        hopelessquestion No. Nutritional screening: No deficits noted. Fall        Risk None identified.  .  .  Name:Schmieg, Havier  [id number]  [id number]  Page 1 of 3  %%PAGE  .  Name: Khale, Nigh  MRN: [id number]  Age: 58 yrs  Sex: Male  DOB: 1975/09/21  Arrival Date: 05/18/2020  Arrival Time: 12:06  Account#: [id number]  Bed E-Eye  PCP: None, Pcp  Chief Complaint:  - Mouth Pain  .  Vital Signs:  12:36 BP 116 / 69; Pulse 85; Resp 16; Temp 35.5(TE); Pulse Ox 94% on  em21        R/A; Pain 8/10;  14:48 Pulse 81; Resp 20; Temp 36.4; Pulse Ox 96% ;                    jt28  .  Glasgow Coma Score:  12:36 Eye Response: spontaneous(4). Verbal Response: oriented(5).     em21        Motor Response: obeys commands(6). Total: 15.  13:30 Eye Response: spontaneous(4). Verbal Response: oriented(5).     jt28        Motor Response: obeys commands(6). Total: 15.  .  Triage Assessment:  12:36 General: Appears uncomfortable, Behavior is cooperative. Pain:  em21        Complains of pain in mouth. Neuro: No deficits noted.        Cardiovascular: No deficits noted. Respiratory: No deficits        noted.  .  Assessment:  13:30 General: Appears in no apparent distress. Pain: Complains of    jt28        pain in lower left central incisor and lower right central        incisor Pain currently is 7/10. EENT: Dental caries noted in        upper right lateral incisor (#7), upper right central  incisor        (#8), upper left central incisor (#9) and upper left lateral        incisor (#10). Cardiovascular: No deficits noted. Respiratory:        No deficits noted. GI: No deficits noted. GI: No deficits        noted. GU: No deficits noted. Skin: No deficits noted.  .  Observations:  12:07  Patient arrived in ED.                                          jd39  12:07 Patient Visited By: [name]                              jd39  12:36 Triage Completed.                                               em21  13:30 Patient Visited By: Ellamae Sia                           jt28  13:54 Patient Visited By: [name]                             jra  14:02 Patient Visited By: Teressa Lower, Panagiotis                         pm23  .  Dispensed Medications:  14:47 Drug: Percocet 1 PO - oxyCODONE-acetaminophen 5 mg-325 mg 1     jt28        tabs Route: PO;  14:48 Drug: Penicillin VK 500 mg Route: PO;                           jt28  .  [name]  Interventions:  .  Name:Filion, Shepherd  [id number]  [id number]  Page 2 of 3  %%PAGE  .  Name: Kamuela, Magos  MRN: [id number]  Age: 6 yrs  Sex: Male  DOB: [birth year]  Arrival Date: 05/18/2020  Arrival Time: 12:06  Account#: [id number]  Bed E-Eye  PCP: None, Pcp  Chief Complaint:  - Mouth Pain  .  12:08 Digital Picture Scanned into Chart                              jd39  13:30 Armband on.                                                     jt28  13:45 Demo Sheet Scanned into Chart                                   db22  .  Outcome:  14:40 Discharge ordered by MD.  jra  14:48 Discharged to home ambulatory. Condition: good. Discharge       jt28        instructions given to patient, Instructed on: discharge        instructions, follow up and referral plans. Pain management.        Demonstrated understanding of instructions, medications,        Prescriptions given X 1. Discharge Assessment: Patient awake,        alert and oriented x 3. No cognitive and/or functional deficits        noted. Patient verbalized understanding of disposition        instructions. Chart Status Nursing note complete and        electronically signed.  14:49 Patient left the ED.                                            jt28  .  Signatures:  [name]                         MD   jra  Fredricka Bonine                            RN   em21  [place], Panagiotis                     DDS  pm23  Ellamae Sia                       RN   jt28  Raelyn Ensign                            Sec  db22  .  .  .  .  .  .  .  .  .  .  .  .  .  .  .  .  .  .  .  Name:Hillis, Rolan  [id number]  [id number]  Page 3 of 3  .  %%END

## 2020-05-24 NOTE — Telephone Encounter (Signed)
Another msg left for patient to return my call. Will also send letter.

## 2020-05-26 LAB — CMP (EXT)
A/G Ratio (EXT): 0.8 ug/mL — ABNORMAL LOW (ref 1.2–2.4)
ALT/SGPT (EXT): 102 U/L — ABNORMAL HIGH (ref 7.0–40.0)
AST/SGOT (EXT): 78 U/L — ABNORMAL HIGH (ref 13–40)
Albumin (EXT): 3.4 g/dL (ref 3.4–5.0)
Alkaline Phosphatase (EXT): 68 U/L (ref 46.0–116.0)
Anion Gap (EXT): 4 mmol/L — ABNORMAL LOW (ref 6–14)
BUN (EXT): 9 mg/dL (ref 9–23)
Bilirubin, Total (EXT): 0.4 mg/dL (ref 0.3–1.2)
CO2 (EXT): 30 mmol/L (ref 20–31)
CalciumCalcium (EXT): 9.4 mg/dL (ref 8.3–10.6)
Chloride (EXT): 108 mmol/L — ABNORMAL HIGH (ref 98–107)
Creatinine (EXT): 0.74 mg/dL (ref 0.60–1.10)
Globulin (EXT): 4.2 g/dL — ABNORMAL HIGH (ref 2.0–3.9)
Glucose (EXT): 152 mg/dL — ABNORMAL HIGH (ref 74–106)
Potassium (EXT): 3.7 mmol/L (ref 3.5–5.1)
Protein (EXT): 7.6 g/dL (ref 5.7–8.2)
Sodium (EXT): 142 mmol/L (ref 136–145)
eGFR - Creat MDRD (EXT): >60 ml/min/1.73m2 (ref >60)

## 2020-05-26 LAB — BMP (EXT)
Anion Gap (EXT): 4 mmol/L — ABNORMAL LOW (ref 6–14)
Anion Gap (EXT): 9 mmol/L (ref 3–17)
BUN (EXT): 10 mg/dL (ref 9–23)
BUN (EXT): 10 mg/dL (ref 6–20)
CO2 (EXT): 28 mmol/L (ref 20–31)
CO2 (EXT): 30 mmol/L (ref 22–31)
CalciumCalcium (EXT): 8.7 mg/dL (ref 8.3–10.6)
CalciumCalcium (EXT): 9.3 mg/dL (ref 8.4–10.2)
Chloride (EXT): 105 mmol/L (ref 98–107)
Chloride (EXT): 98 mmol/L — ABNORMAL LOW (ref 101–113)
Creatinine (EXT): 0.7 mg/dL (ref 0.60–1.10)
Creatinine (EXT): 0.85 mg/dL (ref 0.70–1.20)
GFR Estimated (Calc) (EXT): 111 mL/min/{1.73_m2} (ref >59)
Glucose (EXT): 182 mg/dL — ABNORMAL HIGH (ref 74–106)
Glucose (EXT): 205 mg/dL — ABNORMAL HIGH (ref 70–105)
Potassium (EXT): 3.6 mmol/L (ref 3.5–5.1)
Potassium (EXT): 3.5 mmol/L (ref 3.4–5.0)
Sodium (EXT): 137 mmol/L (ref 136–145)
Sodium (EXT): 137 mmol/L (ref 133–145)
eGFR - Creat MDRD (EXT): >60 ml/min/1.73m2 (ref >60)

## 2020-05-26 LAB — HEMOGLOBIN A1C
Estimated Average Glucose mg/dL (INT/EXT): 283 mg/dL
Estimated Average Glucose mmol/L (INT/EXT): 16 mmol/L
HEMOGLOBIN A1C % (INT/EXT): 11.5 % — ABNORMAL HIGH (ref 4.5–6.0)

## 2020-05-27 LAB — CMP (EXT)
A/G Ratio (EXT): 0.9 ug/mL — ABNORMAL LOW (ref 1.2–2.4)
ALT/SGPT (EXT): 116 U/L — ABNORMAL HIGH (ref 7.0–40.0)
AST/SGOT (EXT): 98 U/L — ABNORMAL HIGH (ref 13–40)
Albumin (EXT): 3.3 g/dL — ABNORMAL LOW (ref 3.4–5.0)
Alkaline Phosphatase (EXT): 62 U/L (ref 46.0–116.0)
Anion Gap (EXT): 4 mmol/L — ABNORMAL LOW (ref 6–14)
BUN (EXT): 13 mg/dL (ref 9–23)
Bilirubin, Total (EXT): 0.4 mg/dL (ref 0.3–1.2)
CO2 (EXT): 29 mmol/L (ref 20–31)
CalciumCalcium (EXT): 9.3 mg/dL (ref 8.3–10.6)
Chloride (EXT): 107 mmol/L (ref 98–107)
Creatinine (EXT): 0.73 mg/dL (ref 0.60–1.10)
Globulin (EXT): 3.8 g/dL (ref 2.0–3.9)
Glucose (EXT): 129 mg/dL — ABNORMAL HIGH (ref 74–106)
Potassium (EXT): 4.1 mmol/L (ref 3.5–5.1)
Protein (EXT): 7.1 g/dL (ref 5.7–8.2)
Sodium (EXT): 140 mmol/L (ref 136–145)
eGFR - Creat MDRD (EXT): >60 ml/min/1.73m2 (ref >60)

## 2020-05-31 ENCOUNTER — Emergency Department
Admission: EM | Admit: 2020-05-31 | Discharge: 2020-05-31 | Discharge status: Against Medical Advice | Payer: No Typology Code available for payment source | Attending: Emergency Medicine | Admitting: Emergency Medicine

## 2020-05-31 DIAGNOSIS — R9431 Abnormal electrocardiogram [ECG] [EKG]: Secondary | ICD-10-CM | POA: Diagnosis not present

## 2020-05-31 DIAGNOSIS — R079 Chest pain, unspecified: Secondary | ICD-10-CM

## 2020-05-31 DIAGNOSIS — Z5329 Procedure and treatment not carried out because of patient's decision for other reasons: Secondary | ICD-10-CM | POA: Insufficient documentation

## 2020-05-31 DIAGNOSIS — R0789 Other chest pain: Secondary | ICD-10-CM | POA: Diagnosis not present

## 2020-05-31 LAB — BASIC METABOLIC PANEL
ANION GAP: 8 mmol/L (ref 5–15)
BUN (UREA NITROGEN): 13 mg/dL (ref 7–18)
CALCIUM: 9 mg/dL (ref 8.5–10.1)
CARBON DIOXIDE: 28 mmol/L (ref 21–32)
CHLORIDE: 103 mmol/L (ref 98–107)
CREATININE: 0.8 mg/dL (ref 0.7–1.2)
ESTIMATED GLOMERULAR FILT RATE: >60 mL/min (ref >60)
Glucose Random: 173 mg/dL — ABNORMAL HIGH (ref 74–160)
POTASSIUM: 4.3 mmol/L (ref 3.5–5.1)
SODIUM: 139 mmol/L (ref 136–145)

## 2020-05-31 LAB — CBC, PLATELET & DIFFERENTIAL
ABSOLUTE BASO COUNT: 0 10*3/uL (ref 0.0–0.1)
ABSOLUTE EOSINOPHIL COUNT: 0.1 10*3/uL (ref 0.0–0.8)
ABSOLUTE IMM GRAN COUNT: 0.02 10*3/uL (ref 0.00–0.03)
ABSOLUTE LYMPH COUNT: 1.6 10*3/uL (ref 0.6–5.9)
ABSOLUTE MONO COUNT: 0.4 10*3/uL (ref 0.2–1.4)
ABSOLUTE NEUTROPHIL COUNT: 2.9 10*3/uL (ref 1.6–8.3)
ABSOLUTE NRBC COUNT: 0 10*3/uL (ref 0.0–0.0)
BASOPHIL %: 0.4 % (ref 0.0–1.2)
EOSINOPHIL %: 2.4 % (ref 0.0–7.0)
HEMATOCRIT: 43.1 % (ref 40.1–51.0)
HEMOGLOBIN: 14 g/dL (ref 13.7–17.5)
IMMATURE GRANULOCYTE %: 0.4 % (ref 0.0–0.4)
LYMPHOCYTE %: 31.6 % (ref 15.0–54.0)
MEAN CORP HGB CONC: 32.5 g/dL (ref 31.0–37.0)
MEAN CORPUSCULAR HGB: 29.5 pg (ref 26.0–34.0)
MEAN CORPUSCULAR VOL: 90.9 fl (ref 80.0–100.0)
MEAN PLATELET VOLUME: 10.8 fL (ref 8.7–12.5)
MONOCYTE %: 7.1 % (ref 4.0–13.0)
NEUTROPHIL %: 58.1 % (ref 40.0–75.0)
NRBC %: 0 % (ref 0.0–0.0)
PLATELET COUNT: 211 10*3/uL (ref 150–400)
RBC DISTRIBUTION WIDTH STD DEV: 43.3 fL (ref 35.1–46.3)
RED BLOOD CELL COUNT: 4.74 M/uL (ref 4.60–6.10)
WHITE BLOOD CELL COUNT: 4.9 10*3/uL (ref 4.0–11.0)

## 2020-05-31 LAB — MAGNESIUM: MAGNESIUM: 1.7 mg/dL — ABNORMAL LOW (ref 1.8–2.4)

## 2020-05-31 LAB — TROPONIN I: TROPONIN I: <0.02 ng/mL (ref 0.00–0.04)

## 2020-05-31 LAB — HOLD BLUE TOP TUBE

## 2020-05-31 NOTE — ED Triage Note (Signed)
Patient states he was walking in his hotel developed midsternal cp radiating to left arm hx of 2 mis in the past patient states it feels like that again ems gave 324mg  of aspirin

## 2020-05-31 NOTE — ED Provider Notes (Signed)
The patient was seen primarily by me. ED nursing record was reviewed. Select prior records as available electronically through the Epic record were reviewed.          HPI:    Nicholas Salas is a 45 year old male patient with h/o congenital heart disease, CAD s/p CABG x2 with multiple visits for chest pain, most recently at [facility] in [place] who is presenting with recurrent chest pain.   States that it started about 45 minutes prior to arrival. Squeezing central chest pain radiating into the jaw and down the L arm.   No fevers. No cough. No SOB. No leg swelling.  No hx of PE/DVT.     Got ASA en route by EMS.       ROS: Pertinent positives were reviewed as per the HPI above. All other systems were reviewed and are negative.  Nicholas Salas  Language of care: English  MRN: [id number]  PCP: None  Mode of arrival to ED: Ambulance [place].  Arrival time:     Chief complaint: Chest Pain (midsternal cp radiating to left arm x 30 minutes )    Past Medical History/Problem list:  Past Medical History:  No date: Acute myocardial infarction, unspecified site, episode of   care unspecified  No date: CAD (coronary artery disease)  No date: Hypercholesteremia  No date: MI (myocardial infarction) (HCC)  No date: Mitral valve prolapse  Patient Active Problem List:     Controlled Medication request    Past Surgical History: Past Surgical History:  2007: CABG W/ARTERIAL GRAFT SINGLE ARTERIAL GRAFT  No date: PR MINIMALLY INVASIVE DIRECT CO  Social History:   Social History     Socioeconomic History   . Marital status: Single     Spouse name: Not on file   . Number of children: Not on file   . Years of education: Not on file   . Highest education level: Not on file   Occupational History   . Not on file   Tobacco Use   . Smoking status: Current Every Day Smoker     Packs/day: 0.25   . Smokeless tobacco: Never Used   Substance and Sexual Activity   . Alcohol use: Not on file   . Drug use: Not on file   . Sexual activity: Not  on file   Other Topics Concern   . Not on file   Social History Narrative   . Not on file   Social Determinants of Health  Financial Resource Strain:     Difficulty of Paying Living Expenses: Not on file  Food Insecurity:     Worried About Running Out of Food in the Last Year: Not on file    Ran Out of Food in the Last Year: Not on file  Transportation Needs:     Lack of Transportation (Medical): Not on file    Lack of Transportation (Non-Medical): Not on file  Physical Activity:     Days of Exercise per Week: Not on file    Minutes of Exercise per Session: Not on file  Stress:     Feeling of Stress : Not on file  Social Connections:     Frequency of Communication with Friends and Family: Not on file    Frequency of Social Gatherings with Friends and Family: Not on file    Attends Religious Services: Not on file    Active Member of Clubs or Organizations: Not on file    Attends Club or  Organization Meetings: Not on file    Marital Status: Not on file  Intimate Partner Violence:     Fear of Current or Ex-Partner: Not on file    Emotionally Abused: Not on file    Physically Abused: Not on file    Sexually Abused: Not on file   Allergies: Review of Patient's Allergies indicates:   Ketorolac trometham*    Hives   Morphine and related    Anaphylaxis   Morphine sulfate-na*        Comment:Pt. States can take dilaudid and other narcotics   Motrin [ibuprofen]      Hives  Immunizations:   There is no immunization history on file for this patient.       Medications:  Prior to Admission Medications   Prescriptions Last Dose Informant Patient Reported? Taking?   ASPIRIN 81 MG OR TABS   Yes No   Sig: daily   LIPITOR 20 MG OR TABS   Yes No   Sig: daily   METOPROLOL TARTRATE OR   Yes No   Sig: None Entered      Facility-Administered Medications: None     Physical Exam:   Patient Vitals for the past 99 hrs:   BP Temp Pulse Resp SpO2 Weight   05/31/20 2000 130/85 98 F 81 20 98 % 120.2 kg (265 lb)      GENERAL:  WDWN, no acute distress, non-toxic   SKIN:  Warm & Dry, no rash, no petechia.  HEAD:  NCAT. Sclerae are anicteric and aninjected, oropharynx is clear with moist mucous membranes. PERRL. EOMI.   NECK:  Supple  LUNGS:  Clear to auscultation bilaterally. No wheezes, rales, rhonchi.   HEART:  RRR.  No murmurs, rubs, or gallops.   ABDOMEN:  Soft, NTND.  No masses.  No involuntary guarding or rebound.   EXTREMITIES:  No obvious deformities.  Warm and well perfused.  No cyanosis, no edema.  NEUROLOGIC:  Alert; moves all extremities; speaking in sentences. Normal gait without ataxia.   PSYCHIATRIC:  Appropriate for age, time of day, and situation    Medications Given in the ED:  Medications - No data to display Radiology Results:     Lab Results (abnormal results only):  Labs Reviewed   BASIC METABOLIC PANEL - Abnormal; Notable for the following components:       Result Value    Glucose Random 173 (*)     All other components within normal limits   MAGNESIUM - Abnormal; Notable for the following components:    MAGNESIUM 1.7 (*)     All other components within normal limits   CBC, PLATELET & DIFFERENTIAL   TROPONIN I   HOLD BLUE TOP TUBE    Other Results/Old Record review (e.g. ECG):  EKG - NSR @ 78 bpms. No acute ischemic ST/T wave changes. Normal intervals.      ED Course and Medical Decision-making:  45 year old male patient who is presenting with chest pain. Noted to be afebrile, hemodynamically stable, nontoxic appearing.     Multiple frequent visits for similar symptoms, frequently leaving AMA when he doesn't receive narcotic medications.     Labwork, EKG obtained without acute findings.     Pt declined nitroglycerin and removed his IV and reported that he was leaving when we discussed holding off on narcotic medications.     Patient/family educated on diagnosis(es); he states understanding and agrees with plan of care  Reasons to return to the ED were reviewed  in detail. He agrees with this plan and  disposition.    Condition on Discharge: Improved and Stable    Diagnosis/Diagnoses:  Chest pain, unspecified type    Posey Boyer, MD  This Emergency Department patient encounter note was created using voice-recognition software and in real time during the ED visit.

## 2020-05-31 NOTE — Narrator Note (Signed)
Patient stating his chest is hurting requesting pain medicine patient states dilaudid 2mg  is usually what works for him md made aware

## 2020-05-31 NOTE — Narrator Note (Signed)
Patient Disposition  ELOPED    Interpreter to provide instructions: No    Patient belongings with patient: YES    Have all existing LDAs been addressed? Yes    Have all IV infusions been stopped? N/A    Destination: eloped

## 2020-05-31 NOTE — Narrator Note (Signed)
Removed pts IV per request and pt ambulated out of dept with steady gait. MD aware.

## 2020-06-01 LAB — BMP (EXT)
Anion Gap (EXT): 8 mmol/L (ref 5–15)
Anion Gap (EXT): 12 mmol/L (ref 3–17)
BUN (EXT): 13 mg/dL (ref 7–18)
BUN (EXT): 12 mg/dL (ref 8–25)
CO2 (EXT): 28 mmol/L (ref 21–32)
CO2 (EXT): 23 mmol/L (ref 23–32)
CalciumCalcium (EXT): 9 mg/dL (ref 8.5–10.1)
CalciumCalcium (EXT): 9.5 mg/dL (ref 8.5–10.5)
Chloride (EXT): 103 mmol/L (ref 98–107)
Chloride (EXT): 104 mmol/L (ref 98–108)
Creatinine (EXT): 0.8 mg/dL (ref 0.7–1.2)
Creatinine (EXT): 0.77 mg/dL (ref 0.60–1.50)
GFR Estimated (Calc) (EXT): 115 mL/min/{1.73_m2} (ref >59)
Glucose (EXT): 173 mg/dL — ABNORMAL HIGH (ref 74–160)
Glucose (EXT): 134 mg/dL — ABNORMAL HIGH (ref 70–110)
Potassium (EXT): 4.3 mmol/L (ref 3.5–5.1)
Potassium (EXT): 3.8 mmol/L (ref 3.4–5.0)
Sodium (EXT): 139 mmol/L (ref 136–145)
Sodium (EXT): 139 mmol/L (ref 135–145)
eGFR - Creat CKD-EPI (EXT): >60 mL/min (ref >60)

## 2020-06-02 ENCOUNTER — Emergency Department: Admit: 2020-06-02 | Discharge status: Against Medical Advice | Source: Home / Self Care

## 2020-06-02 ENCOUNTER — Emergency Department
Admit: 2020-06-02 | Disposition: A | Discharge status: Home / Self Care | Source: Home / Self Care | Attending: Emergency Medicine | Admitting: Emergency Medicine

## 2020-06-02 LAB — EKG

## 2020-06-02 NOTE — Procedures (Signed)
Please click on link to see document

## 2020-06-02 NOTE — ED Notes (Signed)
Please click on link to see document

## 2020-06-05 ENCOUNTER — Emergency Department
Admit: 2020-06-05 | Disposition: A | Discharge status: Other Acute Inpatient Hospital | Source: Home / Self Care | Attending: Emergency Medicine | Admitting: Emergency Medicine

## 2020-06-05 LAB — HX COAGULATION
HX INR PT: 1.1 (ref 0.9–1.3)
HX PROTHROMBIN TIME: 12.7 s (ref 9.7–14.0)
HX PTT: 27.8 s (ref 25.7–35.7)

## 2020-06-05 LAB — HX HEM-ROUTINE
HX ATYL LYMPH #: 0.1 10*3/uL — AB
HX ATYL LYMPH: 4 %
HX BASO #: 0 10*3/uL (ref 0.0–0.2)
HX BASO: 1 %
HX EOSIN #: 0.1 10*3/uL (ref 0.0–0.5)
HX EOSIN: 2 %
HX HCT: 39 % (ref 37.0–47.0)
HX HGB: 12.7 g/dL — ABNORMAL LOW (ref 13.5–16.0)
HX LYMPH #: 1.7 10*3/uL (ref 1.0–4.0)
HX LYMPH: 47 %
HX MCH: 30.1 pg (ref 26.0–34.0)
HX MCHC: 32.6 g/dL (ref 32.0–36.0)
HX MCV: 92.4 fL (ref 80.0–98.0)
HX MONO #: 0.1 10*3/uL — ABNORMAL LOW (ref 0.2–0.8)
HX MONO: 4 %
HX MPV: 10.9 fL (ref 9.1–11.7)
HX NEUT #: 1.5 10*3/uL (ref 1.5–7.5)
HX NRBC #: 0 10*3/uL
HX NUCLEATED RBC: 0 %
HX PLT: 131 10*3/uL — ABNORMAL LOW (ref 150–400)
HX RBC BLOOD COUNT: 4.22 M/uL (ref 4.20–5.50)
HX RDW: 12.7 % (ref 11.5–14.5)
HX SEG NEUT: 42 %
HX TOT CELLS CN: 115
HX WBC: 3.6 10*3/uL — ABNORMAL LOW (ref 4.0–11.0)

## 2020-06-05 LAB — HX CHEM-PANELS
HX 2021 EGFRCR: 105 mL/min/{1.73_m2} (ref 60–999)
HX ANION GAP: 9 (ref 3–14)
HX BLOOD UREA NITROGEN: 12 mg/dL (ref 6–24)
HX CHLORIDE (CL): 108 meq/L (ref 98–110)
HX CO2: 22 meq/L (ref 20–30)
HX CREATININE (CR): 0.93 mg/dL (ref 0.57–1.30)
HX GFR, AFRICAN AMERICAN: 116 mL/min/{1.73_m2}
HX GFR, NON-AFRICAN AMERICAN: 100 mL/min/{1.73_m2}
HX GLUCOSE: 210 mg/dL — ABNORMAL HIGH (ref 70–139)
HX POTASSIUM (K): 3.8 meq/L (ref 3.6–5.1)
HX SODIUM (NA): 139 meq/L (ref 135–145)

## 2020-06-05 LAB — HX CHEM-ENZ-FRAC: HX TROPONIN I: 0.01 ng/mL (ref 0.00–0.03)

## 2020-06-06 ENCOUNTER — Ambulatory Visit: Admitting: Cardiovascular Disease

## 2020-06-06 ENCOUNTER — Observation Stay
Admit: 2020-06-05 | Discharge: 2020-06-06 | Discharge status: Against Medical Advice | Payer: No Typology Code available for payment source

## 2020-06-06 LAB — HX MICRO-RESP VIRAL PANEL: HX COVID-19 (SARS-COV-2) RAPID: POSITIVE — AB

## 2020-06-06 LAB — HX CHOLESTEROL
HX CHOLESTEROL: 212 mg/dL — ABNORMAL HIGH (ref 110–199)
HX HIGH DENSITY LIPOPROTEIN CHOL (HDL): 33 mg/dL — ABNORMAL LOW (ref 35–75)
HX LDL: 147 mg/dL — ABNORMAL HIGH (ref 0–129)
HX TRIGLYCERIDES: 161 mg/dL (ref 40–250)

## 2020-06-06 LAB — HX CHEM-LIPIDS
HX CHOL-HDL RATIO: 6.4
HX CHOLESTEROL: 212 mg/dL — ABNORMAL HIGH (ref 110–199)
HX HIGH DENSITY LIPOPROTEIN CHOL (HDL): 33 mg/dL — ABNORMAL LOW (ref 35–75)
HX LDL: 147 mg/dL — ABNORMAL HIGH (ref 0–129)
HX TRIGLYCERIDES: 161 mg/dL (ref 40–250)

## 2020-06-06 LAB — HX CHEM-ENZ-FRAC: HX TROPONIN I: 0.02 ng/mL (ref 0.00–0.03)

## 2020-06-06 LAB — HX CHEM-LFT
HX ALANINE AMINOTRANSFERASE (ALT/SGPT): 85 IU/L — ABNORMAL HIGH (ref 0–54)
HX ALKALINE PHOSPHATASE (ALK): 55 IU/L (ref 40–130)
HX ASPARTATE AMINOTRANFERASE (AST/SGOT): 57 IU/L — ABNORMAL HIGH (ref 10–42)
HX BILIRUBIN, DIRECT: 0.2 mg/dL (ref 0.0–0.5)
HX BILIRUBIN, TOTAL: 0.4 mg/dL (ref 0.2–1.1)
HX LACTATE DEHYDROGENASE (LDH): 271 IU/L — ABNORMAL HIGH (ref 120–220)

## 2020-06-06 LAB — HX CHEM-OTHER
HX ALBUMIN: 3.7 g/dL (ref 3.4–4.8)
HX FERRITIN: 98 ng/mL (ref 22–277)

## 2020-06-06 LAB — HX DIABETES
HX GLUCOSE: 210 mg/dL — ABNORMAL HIGH (ref 70–139)
HX HEMOGLOBIN A1C: 10.5 % — ABNORMAL HIGH

## 2020-06-06 LAB — HX IMMUNOLOGY: HX C-REACTIVE PROTEIN - CRP: 5.42 mg/L (ref 0.00–7.48)

## 2020-06-06 LAB — HX POINT OF CARE: HX GLUCOSE-POCT: 181 mg/dL — ABNORMAL HIGH (ref 70–139)

## 2020-06-06 LAB — HX CHEM-METABOLIC: HX HEMOGLOBIN A1C: 10.5 % — ABNORMAL HIGH

## 2020-06-06 NOTE — H&P (Signed)
 **COVID-19 Positive/Negative**    COVID-19 Positive or Negative? COVID non-suspect (test negative or pending).    **COVID-19 Positive/Negative**    COVID-19 Positive or Negative? COVID non-suspect (test negative or pending).    **Chief Complaint / HPI**    Chief Complaint Chest Pain .    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 He states that he awoke at 8pm on the night of  admission with 10/10 chest pain which was not alleviated or exacerbated by  anything in particular. He describes "chest tightness" that radiates down the  left arm with diaphoresis, lightheadedness, and nausea but no vomiting. He  denies any SOB. He states the pain was 10/10 severity on arrival. He reports  continued chest pain but has a reported allergy to Nitro to which hives were  experienced. He denies fever/chills, abdominal pain, diarrhea. He reports  multiple allergies to different non-opioid analgesics including ibupforen,  tramadol, and toradol as well as to nitroglycerin. Furthermore, his PDMP  reveals that he has received multiple different opiates from a variety of  providers within the last year.        Regarding his cardiac history, he was previously admitted July 2021 with a  similar presentation but eloped from the ED on the day of admission. He was  also admitted in January 2019 for chest pain and underwent a cardiac CTA which  showed "No coronary atherosclerosis. Coronary calcium score: Zero. Patent re-  implanted right coronary artery ostium. No coronary artery stenosis. Grossly  normal LV and RV systolic function".        In the ED, he was hemodynamically stable and afebrile. Labs were unremarkable.  EKG was unchanged from prior. He was given Aspirin 325 and Tylenol for pain  control. He declines  nitroglycerin, citing an anaphylactic allergy. .    **Chief Complaint / HPI**    Chief Complaint Chest Pain .    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 He states that he awoke at 8pm on the night of  admission with 10/10 chest pain which was not alleviated or exacerbated by  anything in particular. He describes "chest tightness" that radiates down the  left arm with diaphoresis, lightheadedness, and nausea but no vomiting. He  denies any SOB. He states the pain was 10/10 severity on arrival. He reports  continued chest pain but has a reported allergy to Nitro to which hives were  experienced. He denies fever/chills, abdominal pain, diarrhea. He reports  multiple allergies to different non-opioid analgesics including ibupforen,  tramadol, and toradol as well as to nitroglycerin. Furthermore, his PDMP  reveals that he has received multiple different opiates from a variety of  providers within the last year.        Regarding his cardiac history, he was previously admitted July 2021 with a  similar presentation but eloped from the ED on the day of admission. He was  also admitted in January 2019 for chest pain and underwent a cardiac CTA which  showed "No coronary atherosclerosis. Coronary calcium score: Zero. Patent re-  implanted right coronary artery ostium. No coronary artery stenosis. Grossly  normal LV and RV systolic function".        In the ED, he was hemodynamically stable and afebrile. Labs were unremarkable.  EKG was unchanged from prior. He was given Aspirin 325 and Tylenol for pain  control. He declines nitroglycerin, citing an anaphylactic allergy. .    **Allergies**              -  Coconut          -  coconut          -  Codeine              -  Motrin          -  Toradol           -  tramadol        **Allergies**              -  Coconut          -  coconut          -  Codeine              -  Motrin          -  Toradol          -  tramadol        **History**    Significant Med Hx As Listed    Addt'l Med History    PMH:    - Anomalous coronary artery s/p CABG x1 (RIMA-RCA, 2007) c/b graft atresia  requiring re-implantation (2012)    - Self reported h/o MI x3 (2007, 2012, 2013)    - DM2    - HTN    - HLD    - Chronic chest pain        PSH:    - CABG (RIMA to RCA for anomalous coronary artery with compression of the RCA  b/w aorta and pulmonary artery at [facility], CABG in 2012 at [facility])        Allergies:    - Per allergy rec        Medications:    - Per medication rec        Social History:    - Lives with family in [place]    - Works as a Optometrist at [employer]    - EtOH: Denies    - Tobacco: current smoker of 1 cigarette per day, previously 2 packs per day    - Drugs: Denies        Family History:    - Mother alive    - Father deceased from heart disease.    Significant Surg Hx As Listed.    **History**    Significant Med Hx As Listed    Addt'l Med History    PMH:    - Anomalous coronary artery s/p CABG x1 (RIMA-RCA, 2007) c/b graft atresia  requiring re-implantation (2012)    - Self reported h/o MI x3 (2007, 2012, 2013)    - DM2    - HTN    - HLD    - Chronic chest pain        PSH:    - CABG (RIMA to RCA for anomalous coronary artery with compression of the RCA  b/w aorta and pulmonary artery at [facility], CABG in 2012 at [facility])        Allergies:    -  Per allergy rec        Medications:    - Per medication rec        Social History:    - Lives with family in [place]    - Works as a Optometrist at [employer]    - EtOH: Denies    - Tobacco: current smoker of 1 cigarette per day, previously 2 packs per day    - Drugs: Denies        Family History:    - Mother alive    - Father deceased from heart disease.    Significant Surg Hx As Listed.    **ROS**    Complete  Review of Systems All other systems reviewed and negative except as  noted in the HPI.    **ROS**    Complete Review of Systems All other systems reviewed and negative except as  noted in the HPI.    **Exam**    Comment General: sitting in bed in no acute distress    HEENT: EOMI, MMM    Neck: no elevated JVP or LAD    Cardiac: S1 and S2 without S3 or S4. No murmurs or rubs    Pulm: Clear bilaterally without rales or rhonchi    Abdomen: Soft nontender nondistended    Extremiteis: No edema bilaterally    Neuro: AOX3 without focal deficits.    **Exam**    Comment General: sitting in bed in no acute distress    HEENT: EOMI, MMM    Neck: no elevated JVP or LAD    Cardiac: S1 and S2 without S3 or S4. No murmurs or rubs    Pulm: Clear bilaterally without rales or rhonchi    Abdomen: Soft nontender nondistended    Extremiteis: No edema bilaterally    Neuro: AOX3 without focal deficits.    **Assessment and Plan**    Comment [name] is a 39M with history of an anomalous coronary artery s/p  CABG x1 (RIMA-RCA, 2007) c/b graft atresia requiring re-implantation (2012),  self reported h/o MI x3 (2007, 2012, 2013), DM2, HTN, HLD, chronic chest pain  who presents with chest pain.        #Chest Pain in the setting of Anomalous Coronary Artery s/p CABG x1 (2007) and  Re-implantation (2012)    Presents with acute chest "tightness" while walking with associated radiation  to left arm, nausea (no vomiting), diaphoresis, and lightheadedness. EKG  without ischemic changes and trop negative x2. He has multiple risk factors  for ACS although he also has frequent visits for chest pain with negative  cardiac workups. Cardiac CTA from 2019 showed no atherosclerosis and patent  reimplanted RCA with no stenosis. He reports allergies to nitroglycerin as  well as multiple non-opioid analgesics. Review of [facility] shows multiple  opioid prescriptions from different providers over the previous several years.  He was given 325mg  of Aspirin in the  ED though there is no evidence of  myocardial injury thus far. Alternative etiologies for his chest pain may be  musculoskeletal vs pleurisy iso COVID.    - Cycle troponin and ECG    - TTE    - Repeat exercise nuclear stress vs coronary CTA    - Aspirin 81mg     - Atorvastatin 80mg     - Toprol 50mg  qd    - Morphine 1mg  x1, hold additional opiates    - Pain control with lidocaine patches and tylenol        #Type 2 Diabetes  with Hyperglycemia    Prior A1c is elevated to 12. States that he used to be on insulin but was  taken off all diabetes medications and currently managing with diet.    - A1c    - ISS        #COVID-19 Infection    Patient tested positive for COVID-19 on 1/16.    - Maintain EDP until 1/26    - Trend inflammatory markers while here    - Monitor SpO2 for goal >92%    - No indication for Remdesevir or Dexamethasone at this time        FULL CODE    Regular Diet    SQH    PIV. .    **Assessment and Plan**    Comment [name] is a 80M with history of an anomalous coronary artery s/p  CABG x1 (RIMA-RCA, 2007) c/b graft atresia requiring re-implantation (2012),  self reported h/o MI x3 (2007, 2012, 2013), DM2, HTN, HLD, chronic chest pain  who presents with chest pain.        #Chest Pain in the setting of Anomalous Coronary Artery s/p CABG x1 (2007) and  Re-implantation (2012)    Presents with acute chest "tightness" while walking with associated radiation  to left arm, nausea (no vomiting), diaphoresis, and lightheadedness. EKG  without ischemic changes and trop negative x2. He has multiple risk factors  for ACS although he also has frequent visits for chest pain with negative  cardiac workups. Cardiac CTA from 2019 showed no atherosclerosis and patent  reimplanted RCA with no stenosis. He reports allergies to nitroglycerin as  well as multiple non-opioid analgesics. Review of [facility] shows multiple  opioid prescriptions from different providers over the previous several years.  He was given 325mg  of  Aspirin in the ED though there is no evidence of  myocardial injury thus far. Alternative etiologies for his chest pain may be  musculoskeletal vs pleurisy iso COVID.    - Cycle troponin and ECG    - TTE    - Repeat exercise nuclear stress vs coronary CTA    - Aspirin 81mg     - Atorvastatin 80mg     - Toprol 50mg  qd    - Morphine 1mg  x1, hold additional opiates    - Pain control with lidocaine patches and tylenol        #Type 2 Diabetes with Hyperglycemia    Prior A1c is elevated to 12. States that he used to be on insulin but was  taken off all diabetes medications and currently managing with diet.    - A1c    - ISS        #COVID-19 Infection    Patient tested positive for COVID-19 on 1/16.    - Maintain EDP until 1/26    - Trend inflammatory markers while here    - Monitor SpO2 for goal >92%    - No indication for Remdesevir or Dexamethasone at this time        FULL CODE    Regular Diet    SQH    PIV. .            **Electronically signed by Garwin Brothers, MD on 06/06/2020 07:36**        **Electronically signed by Garwin Brothers, MD on 06/06/2020 07:36**

## 2020-06-06 NOTE — ED Provider Notes (Signed)
 [name]  Name: Rudolpho, Claxton  MRN: [id number]  Age: 45 yrs  Sex: Male  DOB: March 24, 1976  Arrival Date: 06/05/2020  Arrival Time: 20:39  Account#: [id number]  Bed Pending Adult  PCP: None, Pcp  Chief Complaint: Chest Pain  .  Presentation:  01/16  20:44 Presenting complaint: Patient states: pt complains of chest     ed16        pain that began approx 20 minutes PTA, pt states he was        sleeping when developed pain. pt has hx of multiple MIs and        bypass. pt complains of nausea, denies vomiting. pt feels        lightheaded.  20:44 Method Of Arrival: Walk In                                      ed16  20:44 Acuity: Adult 2                                                 ed16  .  Historical:  - Allergies:  20:45 Coconut;                                                        ed16  20:45 Codeine;                                                        ed16  20:45 Ibuprofen;                                                      ed16  20:45 Motrin;                                                         ed16  20:45 NTG (drops his BP);                                             ed16  20:45 Toradol;                                                        ed16  20:45 Tramadol HCl;  ed16  - Home Meds:  20:45 aspirin 81 mg Oral chew 1 tab once daily [Active]; Metoprolol   ed16        Tartrate Oral [Active]; Lipitor Oral [Active];  - PMHx:  20:45 CAD; High Cholesterol; mi x3; NIDDM;                            ed16  - PSHx:  20:45 Cardiac Bypass Surgery x2; gallbladder surgery;                 ed16  .  - Social history: Smoking status: .  .  .  Screening:  20:46 SEPSIS SCREENING - Temp > 38.3 or < 36.0 No - Heart Rate > 90   ed16        No - Respiratory > 20 No - SBP < 90 No Does this patient have a        suspected source of infection at this timequestion No SIRS Criteria (>        = 2) No. Safety screen: Patient feels safe. Suicide (ED Safe)        Screening: In the past two  weeks have you felt down, depressed        or hopelessquestion No. Suicidal Thoughts: Over the past two weeks        patient DENIES thoughts of killing self. Denies prior suicide        attempts. Fall Risk None identified. No fall in past 12 months        (0 pts). No secondary diagnosis (0 pts). No IV (0 pts).        Ambulatory Aid- None/Bed Rest/Nurse Assist (0 pts). Gait-        Normal/Bed Rest/Wheelchair (0 pts) Mental Status- Oriented to  .  Name:Salman, Ioane  [id number]  [id number]  Page 1 of 4  %%PAGE  .  Name: Chinonso, Linker  MRN: [id number]  Age: 14 yrs  Sex: Male  DOB: 03-08-1976  Arrival Date: 06/05/2020  Arrival Time: 20:39  Account#: [id number]  Bed Pending Adult  PCP: None, Pcp  Chief Complaint: Chest Pain  .        own ability (0 pts). Total Morse Fall Scale indicates No Risk        (0-24 pts). Exposure Risk/Travel Screening: COVID Symptomsquestion        None. Known COVID 19 exposurequestion No. DPH requests        Isolationquestion(COVID) No. Have you tested + for COVIDquestion No. COVID 19        Vaccinequestion Incomplete Series.  .  Vital Signs:  20:48 BP 137 / 85; Pulse 72; Resp 16; Temp 36.8(O); Pulse Ox 97% on   ed16        R/A; Pain 9/10;  22:14 BP 130 / 76; Pulse 62; Resp 19; Pulse Ox 98% on R/A;            ad44  01/17  02:12 Pulse 57; Resp 20; Pulse Ox 100% on R/A;                        ad44  02:26 BP 129 / 72; Pulse 59; Resp 19; Pulse Ox 100% on R/A;           ad44  .  Glasgow Coma Score:  01/16  20:48 Eye Response: spontaneous(4). Verbal Response: oriented(5).     ed16  Motor Response: obeys commands(6). Total: 15.  21:24 Eye Response: spontaneous(4). Verbal Response: oriented(5).     ad44        Motor Response: obeys commands(6). Total: 15.  .  Triage Assessment:  20:45 General: Appears distressed, Behavior is cooperative, flat.     ed16        Pain: Complains of pain in chest Pain currently is 9/10. Neuro:        No deficits noted. Eye opening: Spontaneously Level on         consciousness: Sustained Attention Verbal Response:        Orientation: Oriented x 3 Speech: Clear. EENT: No deficits        noted. Cardiovascular: Reports lightheadedness, chest pain and        nausea. Respiratory: No deficits noted. Airway is patent        Trachea midline Respiratory effort is even, unlabored,        Respiratory pattern is regular, symmetrical. GI: Reports nausea.  .  Assessment:  21:24 General: Appears in no apparent distress, Behavior is           ad44        cooperative. Pain: Complains of pain in chest the pain radiates        to the: Pain radiates to left arm and neck Pain currently is        9/10. Neuro: Eye opening: Spontaneously Level on consciousness:        Sustained Attention Verbal Response: Orientation: Oriented x 3.        Cardiovascular: Capillary refill < 3 seconds Chest pain is        described as Pain is 9 out of 10 on a pain scale. radiates to        left arm(s) jaw(s) neck. Respiratory: Airway is patent        Respiratory effort is even, unlabored, Respiratory pattern is  .  Name:Nyborg, Jahquan  [id number]  [id number]  Page 2 of 4  %%PAGE  .  Name: Ronan, Duecker  MRN: [id number]  Age: 89 yrs  Sex: Male  DOB: 12-Feb-1976  Arrival Date: 06/05/2020  Arrival Time: 20:39  Account#: [id number]  Bed Pending Adult  PCP: None, Pcp  Chief Complaint: Chest Pain  .        regular, symmetrical. GI: No deficits noted. GU: No deficits        noted. Skin: No deficits noted.  .  Observations:  20:41 Patient arrived in ED.                                          ck16  20:41 Patient Visited By: Arloa Koh  20:45 Triage Completed.                                               ed16  20:54 Registration completed.  ck16  20:54 Patient Visited By: Arloa Koh  22:02 Patient Visited By: Irine Seal                               [phone]  22:27 Patient Visited By: Alysia Penna                                mm34  01/17  00:23 Patient assigned to A7                                          rk10  01:51 Patient assigned to A7                                          rk10  .  Procedure:  01/16  21:04 EKG done. (by ED staff). Old EKG Obtained.                      eb4  21:24 Inserted peripheral IV: 22 gauge Upper arm.                     ad44  .  Dispensed Medications:  22:46 Not Given (Patient Refused): Nitroglycerin Ointment 2 % 0.5     ad44        inches Transdermal once  22:46 Not Given (Patient Refused): Tylenol 650 mg PO once             ad44  22:46 Drug: Aspirin 81mg  x 4 324 mg Route: PO;                        ad44  22:46 Drug: Zofran 4 mg Route: IVP;                                   ad44  23:11 Drug: Tylenol 650 mg Route: PO;                                 ad44  01/17  01:58 Drug: morphine 1 mg Route: IVP;                                 ad44  .  [name]  Interventions:  01/16  20:56 ECG/EKG Scanned into Chart                                      at23  21:01 Demo Sheet Scanned into Chart                                   rk1  .  Outcome:  01/17  00:19  Decision to [facility] by Provider.                            ds27  02:58 Admitted to [facility] 4 via stretcher, with chart. Condition:       ad44        stable. Chart Status Nursing note complete and electronically        signed.  .  Name:Brotherton, Eldo  [id number]  [id number]  Page 3 of 4  %%PAGE  .  Name: Duncan, Alejandro  MRN: [id number]  Age: 89 yrs  Sex: Male  DOB: 01-Feb-1976  Arrival Date: 06/05/2020  Arrival Time: 20:39  Account#: [id number]  Bed Pending Adult  PCP: None, Pcp  Chief Complaint: Chest Pain  .  02:59 Patient left the ED.                                            ad44  .  Signatures:  Leo Grosser                           Sec  rk1  Alysia Penna                           MD   mm34  Illa Level                         CCT  eb4  [place]                           RN   rk10  Diodati, Alcario Drought                           RN   ed16  Tellier, Alexandra                           at23  [place], [place]                          Reg  ck16  Irine Seal                           PA-C ds27  Rutherford [place]ick                        RN   [phone]  .  .  .  .  .  .  .  .  .  .  .  .  .  .  .  .  .  .  .  .  .  .  .  .  .  .  .  .  .  .  .  Name:Ribaudo, Amauris  [id number]  [id number]  Page 4 of 4  .  %%END

## 2020-06-06 NOTE — Progress Notes (Signed)
 Supervisory Note For Resident I performed a history and physical examination  of the patient and discussed the management with the resident. I reviewed the  resident's note and agree with the documented findings and plan of care,  except as documented below. 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    Admitted overnight to cardiology for further management of chest pain. Seen  this morning during rounds.    Patient reports chest pain that started yesterday, substernal with radiation  to left arm, with some associated nausea and sweating. No shortness of breath.  Pain apparently constant, but patient not in distress. He has refused any pain  medications, reported allergy to nitro and he only requested opiates for pain  control overnight.    This morning he is overall angry. He blames the hospital that he was found to  be COVID positive. He reports that he came to see a doctor this past Friday  here (but he does not know which doctor or which office he visited). He  reports that he continues to have constant chest pain. He denies shortness of  breath. He denies cough, sputum production or fevers.    He is afebrile. BP is 146/79 and HR is in the 50's    Exam without evidence of volume overload. ECG with sinus and no ST/T changes  suggestive of ischemia (tracing similar to prior in the system)    BMP normal, wbc: 3.6, Hg: 12.7    2 troponins negative and patient refused a third troponin and morning labs    Of note, review of patient's chart shows that he has received opiates the last  1 year from different providers. He was in the hospital on 7/21 with similar  complaints and he eloped from the ER. He was also admitted in 2019 here and at  that time he had a CTA coronaries,  which showed no coronary atherosclerosis  and patent RIMA- RCA        Assessment:    1\. Chest pain with negative cardiac biomarkers and ECG without ischemic  changes    2\. History of 1 vessel CABG (RIMA- RCA) in 2007 with re-implantation in 2012  for anoumalous RCA with malignant course    3\. HTN    4\. Hyperlipidemia    5\. DM        Plan:    - We requested a third troponin this morning, but the patient refused to do  that and he also refused all his morning labs    - We offered a CTA of the coronaries to assess for presence of CAD and issues  with the prior single graft. When I offered this the patient responded "this  is my health and I need to think about this". I told him that we can get this  test today to try to figure out if the pain is cardiac and treat it  appropriately, but again his response was that he can't be pushed to do things  and that he needs to think about this    - Echo to evaluate LV systolic function    - Continue asa, statin, b- blocker therapy    - Would avoid opiates, can use only very  low dose oxycodone if he goes to the  CT scan, but he can not be just getting opiates without completing the tests  that we are requesting and refusing all rest of care. Otherwise treat with  tylenol and may use one dose of Toradol    - He is COVID-19 positive, but apparently he has no associated symptoms. He  reports that he is vaccinated and has received booster. .            **Electronically signed by Elmer Picker, MD on 06/06/2020 12:29**

## 2020-06-06 NOTE — ED Provider Notes (Signed)
 [name]  [name], [name]  MRN: [id number]  Age: 45 yrs  Sex: Male  DOB: 1975-12-02  Arrival Date: 06/05/2020  Arrival Time: 20:39  Account#: [id number]  .  Working Diagnosis: Chest pain, unspecified  PCP: None, Pcp  .  HPI:  01/16  23:53 This 45 yrs old Black Male presents to ER via Walk In with      ds27        complaints of Chest Pain.  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 Patient states he awoke from the his sleep        with the pain. Patient states pain is not reproducible with        exertion and he does have mild nausea. Patient states no recent        illness, cough, congestion, hemoptysis, edema in extremities,        palpitations, abdominal pain, or other concerns at this time. .  .  Historical:  - Allergies: Coconut; Codeine; Ibuprofen; Motrin; NTG (drops his BP);    Toradol; Tramadol HCl;  - Home Meds: aspirin 81 mg Oral chew 1 tab once daily; Metoprolol Tartrate    Oral; Lipitor Oral;  - PMHx: CAD; High Cholesterol; mi x3; NIDDM;  - PSHx: Cardiac Bypass Surgery x2; gallbladder surgery;  - Social history: Smoking status: .  .  .  ROS:  23:58 Constitutional: Negative for body aches, chills, fatigue,       ds27        malaise, poor PO intake.  23:58 Eyes: Negative for blurry vision, discharge, foreign body        sensation, photophobia, redness, vision loss.  23:58 ENT: Negative for dental pain, difficulty handling secretions,        difficulty swallowing, sinus congestion, sinus pain, sore        throat.  23:58 Neck Negative for injury or acute deformity, pain with        movement, swelling, tenderness.  23:58 Cardiovascular: Positive for chest pain, Negative for edema,        orthopnea, palpitations, paroxysmal nocturnal dyspnea.  23:58 Respiratory: Negative for cough, shortness of breath, sputum        production, wheezing.  23:58 Abdomen/GI: Negative for abdominal pain,  nausea, vomiting,        diarrhea.  23:58 Back: Negative for pain at rest, pain with movement.  23:58 GU: Negative for urinary symptoms, flank pain.  23:58 MS/extremity: Negative for stiffness, swelling, tenderness.  23:58 Neuro: Negative for altered mental status, dizziness, loss of  .  [name], [name]  [id number]  [id number]  Page 1 of 5  %%PAGE  .  [name], [name]  MRN: [id number]  Age: 45 yrs  Sex: Male  DOB: [birth year]  Arrival Date: 06/05/2020  Arrival Time: 20:39  Account#: [id number]  .  Working Diagnosis: Chest pain, unspecified  PCP: None, Pcp  .        consciousness, syncope.  23:58 Endocrine: Negative for weight loss.  23:58 Hematologic/Lymphatic: Negative for abnormal bleeding, anemia,        Night sweats swollen nodes, Abnormal clotting  .  Vital Signs:  20:48 BP 137 / 85; Pulse 72; Resp 16; Temp 36.8(O); Pulse Ox 97% on   ed16  R/A; Pain 9/10;  22:14 BP 130 / 76; Pulse 62; Resp 19; Pulse Ox 98% on R/A;            ad44  01/17  02:12 Pulse 57; Resp 20; Pulse Ox 100% on R/A;                        ad44  02:26 BP 129 / 72; Pulse 59; Resp 19; Pulse Ox 100% on R/A;           ad44  .  Glasgow Coma Score:  01/16  20:48 Eye Response: spontaneous(4). Verbal Response: oriented(5).     ed16        Motor Response: obeys commands(6). Total: 15.  21:24 Eye Response: spontaneous(4). Verbal Response: oriented(5).     ad44        Motor Response: obeys commands(6). Total: 15.  .  MDM:  .  01/16  20:41 Order name: EDIE_CAREPLAN; Complete Time: 20:56                 dispa  t  01/16  20:41 Order name: Wyvonnia Dusky; Complete Time: 20:56             dispa  t  01/16  21:29 Order name: BUN (Blood Urea Nitrogen); Complete Time: 22:00     ad44  01/16  21:29 Order name: CBC/Diff (With Plt)                                 ad44  01/16  21:29 Order name: CR (Creatinine); Complete Time: 22:00               ad44  01/16  21:29 Order name: GLU (Glucose); Complete Time: 22:00                  ad44  01/16  21:29 Order name: LYTES (Na, K, Cl, Co2); Complete Time: 22:00        ad44  01/16  21:29 Order name: PT (Prothrombin Time With INR); Complete Time: 16:10RU04  01/16  21:29 Order name: PTT; Complete Time: 22:00                           ad44  01/16  21:29 Order name: Troponin I; Complete Time: 22:30                    ad44  .  [name], [name]  [id number]  [id number]  Page 2 of 5  %%PAGE  .  [name], [name]  MRN: [id number]  Age: 45 yrs  Sex: Male  DOB: [birth year]  Arrival Date: 06/05/2020  Arrival Time: 20:39  Account#: [id number]  .  Working Diagnosis: Chest pain, unspecified  PCP: None, Pcp  .  01/16  22:30 Interpretation: Within normal limits: Troponin I 0.01.          mm34  01/16  21:55 Order name: GFR, NAA; Complete Time: 22:00                      dispa  t  01/16  21:55 Order name: GFR, AA; Complete Time: 22:00                       dispa  t  01/16  21:55 Order name: 2021 eGFRcr; Complete Time: 22:00  dispa  t  01/16  22:31 Order name: RBC Morph                                           dispa  t  01/16  20:53 Order name: Adult EKG (order using folder); Complete Time: 20:53ed16  01/16  20:53 Order name: EKG (order using folder); Complete Time: 21:04      ed16  01/16  22:34 Order name: CXR (Portable)                                      ds27  01/16  23:39 Order name: Troponin I: Repeat at 12:30                         ds27  01/16  23:44 Order name: COVID-19 PCR (Symptomatic patients)                 ds27  01/17  01:36 Order name: COVID-19 (SARS-CoV-2) PCR RAPID                     dispa  t  .  Dispensed Medications:  22:46 Not Given (Patient Refused): Nitroglycerin Ointment 2 % 0.5     ad44        inches Transdermal once  22:46 Not Given (Patient Refused): Tylenol 650 mg PO once             ad44  22:46 Drug: Aspirin 81mg  x 4 324 mg Route: PO;                        ad44  22:46 Drug: Zofran 4 mg Route: IVP;                                   ad44  23:11 Drug: Tylenol  650 mg Route: PO;                                 ad44  01/17  01:58 Drug: morphine 1 mg Route: IVP;                                 ad44  .  [name]  Radiology Orders:  Order Name: CXR (Portable); Last Status: Returned; Time:    06/05/20 22:34; By: [id number]; For: ds27; Order Method:    Electronic; Notes: Bed Name: A7  Attending Notes:  01/16  22:28 Attestation: Assessment and care plan reviewed with             mm34        resident/midlevel provider. See their note for details.  .  [name], [name]  [id number]  [id number]  Page 3 of 5  %%PAGE  .  [name], [name]  MRN: [id number]  Age: 45 yrs  Sex: Male  DOB: 05-Jun-1975  Arrival Date: 06/05/2020  Arrival Time: 20:39  Account#: [id number]  .  Working Diagnosis: Chest pain, unspecified  PCP: None, Pcp  .        Physician Assistant's history reviewed, patient interviewed and  examined. Attending ROS As documented by Physician Assistant        ROS and HPI. I have reviewed Nurses Notes, and I agree.  22:31 Lab/Ancillary show: EKG interpreted by me and shows: NSR 69     mm34        nlaxis qrs and st.  22:39 Attending Exam: My personal exam reveals Gen -middle-age male   mm34        currently in no acute distress Head- NC/AT Eyes - pupils equal.        conjugate gaze nl sclera and conjunctiva. ENT - Normal external        nose and ears. Neck - Supple Cardiac -regular. no murmurs,        rubs, gallops Lungs - CTA Abd -soft, nt Ext- no cyanosis. Skin-        Warm and dry Neuro - nonfocal psych - normal affect.  .  Disposition Summary:  06/06/20 00:19  Hospitalization Ordered        Hospitalization Status: Inpatient Admission                     ds27        Provider: OTHER, Doctor - See Notes                             ds27        Location: Adult Floor                                           ds27        Condition: Stable                                               ds27        Problem: new                                                    ds27         Symptoms: are unchanged                                         ds27        Bed/Room Type: Regular                                          ds27        Room Assignment: [facility] 4(06/06/20 01:51)                        rk10        Diagnosis          - Chest pain, unspecified  ds27        Forms:          - Handoff Communication Form                                  ds27          - Fax Summary                                                 ds27  Signatures:  Lorraine Lax                      MD   fdf  Dispatcher, [employer]                          dispa  Milas Hock                           MD   ss27  Alysia Penna                           MD   [address]                           RN   rk10  [employer]win [employer]                      MD   bs22  [place], [facility]                          RN   ed16  [place], [place]                          Reg  ck16  Irine Seal                           PA-C ds27  Rutherford Limerick                        RN   [phone]  .  Corrections: (The following items were deleted from the chart)  01/17  .  [name], [name]  [id number]  [id number]  Page 4 of 5  %%PAGE  .  [name], [name]  MRN: [id number]  Age: 89 yrs  Sex: Male  DOB: 1975-09-18  Arrival Date: 06/05/2020  Arrival Time: 20:39  Account#: [id number]  .  Working Diagnosis: Chest pain, unspecified  PCP: None, Pcp  .  00:23 00:19 ds27                                                      rk10  01:51 00:23 *PENDING BED* NF62  rk10  .  Document is preliminary until electronically or manually signed by the atte  nding physician  .  .  .  .  .  .  .  .  .  .  .  .  .  .  .  .  .  .  .  .  .  .  .  .  .  .  .  .  .  .  .  .  .  .  .  .  .  .  .  [name], [name]  [id number]  [id number]  Page 5 of 5  .  %%END

## 2020-06-15 LAB — CMP (EXT)
ALT/SGPT (EXT): 113 U/L — ABNORMAL HIGH (ref 12–78)
AST/SGOT (EXT): 61 U/L — ABNORMAL HIGH (ref 15–37)
Albumin (EXT): 3.2 g/dL — ABNORMAL LOW (ref 3.4–5.0)
Alkaline Phosphatase (EXT): 59 U/L (ref 50–136)
Anion Gap (EXT): 5 (ref 5–15)
BUN (EXT): 12 mg/dL (ref 7–18)
Bilirubin, Total (EXT): 0.4 mg/dL (ref 0.3–1.2)
CO2 (EXT): 26 mmol/L (ref 22–30)
CalciumCalcium (EXT): 9.4 mg/dL (ref 8.5–10.1)
Chloride (EXT): 109 mmol/L — ABNORMAL HIGH (ref 98–107)
Creatinine (EXT): 0.85 mg/dL (ref 0.60–1.30)
Glucose (EXT): 152 mg/dL — ABNORMAL HIGH (ref 70–99)
Potassium (EXT): 3.7 mmol/L (ref 3.5–5.3)
Protein (EXT): 7.7 g/dL (ref 6.4–8.2)
Sodium (EXT): 140 mmol/L (ref 136–145)
eGFR - Creat CKD-EPI (EXT): >90 mL/min/{1.73_m2} (ref >=90)

## 2020-06-15 LAB — COVID-19 CARE EVERYWHERE: COVID-19 CARE EVERYWHERE: NOT DETECTED

## 2020-06-26 LAB — BMP (EXT)
Anion Gap (EXT): 10 mmol/L (ref 3–17)
BUN (EXT): 9 mg/dL (ref 8–25)
CO2 (EXT): 26 mmol/L (ref 23–32)
CalciumCalcium (EXT): 9.6 mg/dL (ref 8.5–10.5)
Chloride (EXT): 104 mmol/L (ref 98–108)
Creatinine (EXT): 0.86 mg/dL (ref 0.60–1.50)
GFR Estimated (Calc) (EXT): 111 mL/min/{1.73_m2} (ref >59)
Glucose (EXT): 189 mg/dL — ABNORMAL HIGH (ref 70–110)
Potassium (EXT): 4.1 mmol/L (ref 3.4–5.0)
Sodium (EXT): 140 mmol/L (ref 135–145)

## 2020-06-29 ENCOUNTER — Ambulatory Visit: Admitting: Emergency Medicine

## 2020-06-29 ENCOUNTER — Emergency Department
Admit: 2020-06-29 | Disposition: A | Discharge status: Home / Self Care | Source: Home / Self Care | Attending: Emergency Medicine | Admitting: Emergency Medicine

## 2020-06-29 ENCOUNTER — Emergency Department: Admit: 2020-06-29 | Discharge: 2020-06-29 | Disposition: A | Discharge status: Home / Self Care | Payer: Medicaid Other

## 2020-06-29 MED FILL — *AMOXICILLIN 500 MG CAP: 5 days supply | Qty: 15 | Fill #0 | Status: AC

## 2020-06-29 MED FILL — oxyCODONE HCL 5 MG TABS: 1 days supply | Qty: 3 | Fill #0 | Status: AC

## 2020-06-29 NOTE — ED Provider Notes (Signed)
[name]  [name], [name]  MRN: [id number]  Age: 45 yrs  Sex: Male  DOB: 26-Apr-1976  Arrival Date: 06/29/2020  Arrival Time: 14:30  Account#: [id number]  Bed E-Eye  PCP: Md Notinprofile, Md  Chief Complaint: Dental Problem  .  Presentation:  02/09  14:37 Presenting complaint: Patient states: Patient reports pain in   tl19        bottom front teeth. Patient has appointment with dental clinic        tomorrow but needs medication for pain today. Poor dentition        noted.  14:37 Method Of Arrival: Walk In                                      tl19  14:37 Acuity: Adult 4                                                 tl19  .  Historical:  - Allergies:  14:38 Toradol;                                                        tl19  14:38 Tramadol HCl;                                                   tl19  14:38 NTG (drops his BP);                                             tl19  14:38 Motrin;                                                         tl19  14:38 Ibuprofen;                                                      tl19  14:38 Coconut;                                                        tl19  - Home Meds:  14:38 Metoprolol Tartrate Oral [Active];                              tl19  - PMHx:  14:38 CAD; High Cholesterol; mi x3;  NIDDM;                            tl19  - PSHx:  14:38 Cardiac Bypass Surgery x2; gallbladder surgery;                 tl19  .  - Social history: Smoking status: Patient uses tobacco    products, current every day smoker.  .  .  Screening:  14:38 SEPSIS SCREENING - Temp > 38.3 or < 36.0 No - Heart Rate > 90   tl19        No - Respiratory > 20 No - SBP < 90 No SIRS Criteria (> = 2)        No. Safety screen: Patient feels safe. Fall Risk None        identified. Exposure Risk/Travel Screening: COVID Symptomsquestion        None. Known COVID 19 exposurequestion No. DPH requests        Isolationquestion(COVID) No. Have you tested + for COVIDquestion No. COVID 19        Vaccinequestion Yes-patient  states they completed COVID vaccine        recommendations over 2 weeks ago.  .  Vital Signs:  14:38 BP 114 / 83; Pulse 82; Resp 17; Temp 35.8(TE); Pulse Ox 97% on  tl19        R/A; Pain 8/10;  15:22 Temp 36.3(O);                                                   kh30  .  [name], [name]  [id number]  [id number]  Page 1 of 3  %%PAGE  .  [name], [name]  MRN: [id number]  Age: 59 yrs  Sex: Male  DOB: 28-Jul-1975  Arrival Date: 06/29/2020  Arrival Time: 14:30  Account#: [id number]  Bed E-Eye  PCP: Md Notinprofile, Md  Chief Complaint: Dental Problem  .  [name]  Glasgow Coma Score:  14:38 Eye Response: spontaneous(4). Verbal Response: oriented(5).     tl19        Motor Response: obeys commands(6). Total: 15.  15:26 Eye Response: spontaneous(4). Verbal Response: oriented(5).     kh30        Motor Response: obeys commands(6). Total: 15.  .  Triage Assessment:  14:38 General: Appears uncomfortable, Behavior is appropriate for     tl19        age, cooperative. Pain: Complains of pain in mouth. EENT: Poor        dentition noted. Cardiovascular: No deficits noted.        Respiratory: No deficits noted. Airway is patent Respiratory        effort is even, unlabored, Respiratory pattern is regular,        symmetrical. Skin: No deficits noted. Skin is pink, warm / dry.  .  Assessment:  15:26 General: Appears comfortable, Behavior is cooperative. Pain:    kh30        Pain currently is 7/10. Neuro: Eye opening: Spontaneously Level        on consciousness: Sustained Attention Verbal Response:        Orientation: Oriented x 3. Cardiovascular: Capillary refill < 3        seconds. Respiratory: Airway is patent Respiratory effort is  even, unlabored, Respiratory pattern is regular, symmetrical.        GI: Abdomen is flat. Skin: Skin is healthy with good turgor.  .  Observations:  14:30 Patient arrived in ED.                                          bh12  14:38 Triage Completed.                                                tl19  14:50 Registration completed.                                         cw15  14:50 Patient Visited By: Wayna Chalet                               cw15  15:07 Patient Visited By: Dwana Curd  Dispensed Medications:  15:27 Drug: Amoxicillin 500 mg Route: PO;                             kh30  15:27 Drug: Percocet 1 PO - oxyCODONE-acetaminophen 5 mg-325 mg 1     kh30        tabs Route: PO;  .  [name]  Interventions:  14:30 Digital Picture Scanned into Chart                              bh12  14:38 Patient placed in exam room in view of nurse.                   tl19  15:37 Demo Sheet Scanned into Chart                                   dw  .  .  [name], [name]  [id number]  [id number]  Page 2 of 3  %%PAGE  .  [name], [name]  MRN: [id number]  Age: 34 yrs  Sex: Male  DOB: 03-28-1976  Arrival Date: 06/29/2020  Arrival Time: 14:30  Account#: [id number]  Bed E-Eye  PCP: Md Notinprofile, Md  Chief Complaint: Dental Problem  .  Outcome:  15:18 Discharge ordered by MD.                                        fw2  15:26 Discharged to home. Condition: stable. Discharge Assessment:    kh30        Patient awake, alert and oriented x 3. No cognitive and/or        functional deficits noted. Patient verbalized understanding of        disposition instructions. Chart Status  Nursing note complete        and electronically signed.  15:27 Patient left the ED.                                            kh30  .  Corrections: (The following items were deleted from the chart)  14:38 14:38 Allergies: Codeine; tl19                                  tl19  .  Signatures:  Santo Held                                  dw  Mayer Camel                           MD   fw2  Cecille Aver                          RN   [address]                                [place], [place]                              bh12  Laqueta Carina                           RN    kh30  .  .  .  .  .  .  .  .  .  .  .  .  .  .  .  .  .  .  .  .  .  .  .  .  [name], [name]  [id number]  [id number]  Page 3 of 3  .  %%END

## 2020-06-29 NOTE — ED Provider Notes (Signed)
 [name]  [name], [name]  MRN: [id number]  Age: 45 yrs  Sex: Male  DOB: [birth year]  Arrival Date: 06/29/2020  Arrival Time: 14:30  Account#: [id number]  .  Working Diagnosis: Dental root caries  PCP: Md Notinprofile, Md  .  Historical:  - Allergies: Toradol; Tramadol HCl; NTG (drops his BP); Motrin; Ibuprofen;    Coconut;  - Home Meds: Metoprolol Tartrate Oral;  - PMHx: CAD; High Cholesterol; mi x3; NIDDM;  - PSHx: Cardiac Bypass Surgery x2; gallbladder surgery;  - Social history: Smoking status: Patient uses tobacco    products, current every day smoker.  .  .  Vital Signs:  02/09  14:38 BP 114 / 83; Pulse 82; Resp 17; Temp 35.8(TE); Pulse Ox 97% on  tl19        R/A; Pain 8/10;  15:22 Temp 36.3(O);                                                   kh30  .  Glasgow Coma Score:  14:38 Eye Response: spontaneous(4). Verbal Response: oriented(5).     tl19        Motor Response: obeys commands(6). Total: 15.  15:26 Eye Response: spontaneous(4). Verbal Response: oriented(5).     kh30        Motor Response: obeys commands(6). Total: 15.  .  MDM:  .  02/09  14:36 Order name: EDIE_CAREPLAN                                       dispa  t  02/09  14:36 Order name: PATIENTPING STORY                                   dispa  t  .  Dispensed Medications:  15:27 Drug: Amoxicillin 500 mg Route: PO;                             kh30  15:27 Drug: Percocet 1 PO - oxyCODONE-acetaminophen 5 mg-325 mg 1     kh30        tabs Route: PO;  .  .  Disposition Summary:  06/29/20 15:18  Discharge Ordered        Location: Home -                                                fw2        Problem: an acute exacerbation                                  fw2        Symptoms: are unchanged                                         fw2        Condition: Stable  fw2  .  [name], [name]  [id number]  [id number]  Page 1 of 2  %%PAGE  .  [name], [name]  MRN: [id number]  Age: 72 yrs  Sex: Male  DOB:  [birth year]  Arrival Date: 06/29/2020  Arrival Time: 14:30  Account#: [id number]  .  Working Diagnosis: Dental root caries  PCP: Md Notinprofile, Md  .        Diagnosis          - Dental root caries                                          fw2        Followup:                                                       fw2          - With: [facility] [place]          - When: Tomorrow          - Reason: Continuance of care        Discharge Instructions:          - Discharge Summary Sheet                                     fw2          - Dental Pain                                                 fw2          - DENTAL CAVITY                                               fw2        Forms:          - Medication Reconciliation Form                              fw2          - Fax Summary                                                 fw2        Prescriptions:          - oxycodone 5 mg Oral Tablet              - take 1 tablet by ORAL route every 4-6 hours Pt may fill fw2        lesser # of tabs; 3 tablet; Refills: 0, Product Selection        Permitted          -  Amoxicillin 500 mg Oral Capsule              - take 1 capsule by ORAL route every 8 hours for 5 days;  fw2        15 tablet; Refills: 0, Product Selection Permitted  Signatures:  Mayer Camel                           MD   fw2  Cecille Aver                          RN   [address] [place]                                cw15  Laqueta Carina                           RN   kh30  .  Corrections: (The following items were deleted from the chart)  14:38 14:38 Allergies: Codeine; tl19                                  tl19  .  Document is preliminary until electronically or manually signed by the atte  nding physician  .  .  .  .  .  .  .  .  .  .  .  .  [name], [name]  [id number]  [id number]  Page 2 of 2  .  %%END

## 2020-07-07 LAB — COVID-19 AND INFLUENZA POC CARE EVERYWHERE
COVID-19 POC CARE EVERYWHERE: NEGATIVE
INFLUENZA A POC CARE EVERYWHERE: NEGATIVE
INFLUENZA B POC CARE EVERYWHERE: NEGATIVE

## 2020-07-07 LAB — LAB EXTERNAL RESULT UNMAPPED

## 2020-07-17 LAB — LIPID PROFILE (EXT)
Chol/HDL Ratio (EXT): 7.4 — ABNORMAL HIGH (ref <5.0)
Cholesterol (EXT): 215 mg/dL — ABNORMAL HIGH (ref <200)
HDL Cholesterol (EXT): 29 mg/dL — ABNORMAL LOW (ref 40–59)
LDL Cholesterol (EXT): 145 mg/dL — ABNORMAL HIGH (ref <100)
NON HDL Cholesterol (EXT): 186 mg/dL
Triglycerides (EXT): 203 mg/dL — ABNORMAL HIGH (ref <150)
VLDL Cholesterol (EXT): 40.6 mg/dL

## 2020-07-17 LAB — CMP (EXT)
ALT/SGPT (EXT): 119 U/L — ABNORMAL HIGH (ref 10–40)
AST/SGOT (EXT): 71 U/L — ABNORMAL HIGH (ref 10–40)
Albumin (EXT): 4.2 g/dL (ref 3.5–4.8)
Alkaline Phosphatase (EXT): 50 U/L (ref 30–115)
Anion Gap (EXT): 7 (ref 5–15)
BUN (EXT): 11 mg/dL (ref 7–23)
Bilirubin, Total (EXT): 0.5 mg/dL (ref 0.3–1.2)
CO2 (EXT): 29 mmol/L (ref 24–32)
CalciumCalcium (EXT): 9.7 mg/dL (ref 8.7–10.7)
Chloride (EXT): 104 mmol/L (ref 97–110)
Creatinine (EXT): 0.98 mg/dL (ref 0.60–1.30)
Glucose (EXT): 196 mg/dL — ABNORMAL HIGH (ref 70–99)
Potassium (EXT): 4.9 mmol/L (ref 3.5–5.3)
Protein (EXT): 7.8 g/dL (ref 6.0–8.0)
Sodium (EXT): 140 mmol/L (ref 135–145)
eGFR - Creat CKD-EPI (EXT): >90 mL/min/{1.73_m2} (ref >=90)

## 2020-07-17 LAB — APTT CARE EVERYWHERE: APTT CARE EVERYWHERE: 25.9 s (ref 23.0–32.0)

## 2020-07-17 LAB — COVID-19 CARE EVERYWHERE: COVID-19 CARE EVERYWHERE: NOT DETECTED

## 2020-07-18 LAB — HEMOGLOBIN A1C CARE EVERYWHERE
ESTIMATED AVERAGE GLUCOSE CARE EVERYWHERE: 10.3 mmol/L
ESTIMATED AVERAGE GLUCOSE CARE EVERYWHERE: 186 mg/dL
HEMOGLOBIN A1C CARE EVERYWHERE: 8.1 %{Hb} — ABNORMAL HIGH (ref <5.7)

## 2020-07-26 ENCOUNTER — Emergency Department: Admit: 2020-07-26 | Payer: PRIVATE HEALTH INSURANCE | Primary: Family Medicine

## 2020-07-26 ENCOUNTER — Inpatient Hospital Stay
Admit: 2020-07-26 | Discharge: 2020-07-26 | Disposition: A | Discharge status: Home / Self Care | Payer: PRIVATE HEALTH INSURANCE | Attending: Emergency Medicine

## 2020-07-26 DIAGNOSIS — R0789 Other chest pain: Secondary | ICD-10-CM

## 2020-07-26 LAB — RESPIRATORY PANEL BASIC CARE EVERYWHERE
COVID-19 CARE EVERYWHERE: NEGATIVE
INFLUENZA A PCR CARE EVERYWHERE: NEGATIVE
INFLUENZA B PCR CARE EVERYWHERE: NEGATIVE
RSV PCR CARE EVERYWHERE: NEGATIVE

## 2020-07-26 LAB — COVID-19 & INFLUENZA COMBO
RSV By PCR: NEGATIVE
Rapid Influenza A By PCR: NEGATIVE
Rapid Influenza B By PCR: NEGATIVE
SARS-CoV-2: NEGATIVE

## 2020-07-26 LAB — COVID-19,INFLUENZA A/B,RSV PANEL
Influenza A by PCR: NEGATIVE
Influenza B by PCR: NEGATIVE
RSV by PCR: NEGATIVE
SARS-CoV-2 by PCR: NEGATIVE

## 2020-07-26 MED ORDER — LIDOCAINE 5 % (700 MG/PATCH) ADHESIVE PATCH
5 % | CUTANEOUS | Status: DC
Start: 2020-07-26 — End: 2020-07-26

## 2020-07-26 MED ORDER — ACETAMINOPHEN 325 MG TABLET
325 mg | Freq: Once | ORAL | Status: DC
Start: 2020-07-26 — End: 2020-07-26

## 2020-07-26 MED ORDER — ASPIRIN 81 MG CHEWABLE TAB
81 mg | ORAL | Status: AC
Start: 2020-07-26 — End: 2020-07-26
  Administered 2020-07-26: 08:00:00 via ORAL

## 2020-07-26 MED FILL — TYLENOL 325 MG TABLET: 325 mg | ORAL | Qty: 2

## 2020-07-26 MED FILL — LIDOCAINE 5 % (700 MG/PATCH) ADHESIVE PATCH: 5 % | CUTANEOUS | Qty: 1

## 2020-07-26 MED FILL — CHILDREN'S ASPIRIN 81 MG CHEWABLE TABLET: 81 mg | ORAL | Qty: 1

## 2020-07-26 NOTE — ED Provider Notes (Signed)
This is a 45 year old male who is here with recurrent chest pain.    History is obtained per the patient as well as review of his records.    Patient has previous history of anomalous RCA with previous surgery in the past.    His records show history of chronic chest pain.  He has had multiple evaluations at multiple facilities in multiple states including [place] and [place].  He has left AMA multiple times most recently on 07/17/2020 at [facility].    Patient also was at [facility] on 07/07/2020 with chest pain and eloped when patient did not receive pain medications.    According to his records, patient was also at [facility] and was started on 06/15/2020 with chest pain.  There apparently was a nuclear stress test that was "normal".    Patient states he currently lives in [place].  Patient states he had to come up because his cousin was in the hospital for COVID 19. Patient had to take care of his cousin's affairs.    Patient denied being around his cousin.    Patient states he was walking when he started to have sharp midsternal chest pain rating to the left shoulder similar to what he had in the past.    Patient states he did not take his aspirin.  He is here for evaluation.    He denies any fevers, cough, shortness of breath, abdominal pain or leg pain and swelling.             Past Medical History:   Diagnosis Date   ??? CAD (coronary artery disease)     MI x 3   ??? Cardiac revascularization with aortocoronary bypass anastomosis    ??? Chronic dental pain     - multiple teeth remove   ??? Chronic knee pain    ??? Hypertension    ??? Myocardial infarct [facility])    ??? S/P CABG x 1 2007    CABG x 1, 2007, due to congenital heart disease--patient's explanation is that right coronary artery was never in the proper location (perfused left side of heart only) and he had repeat cabage surgery in 2012 to correct this.   ??? Thromboembolus [facility])        Past Surgical History:   Procedure Laterality Date    ??? HX CHOLECYSTECTOMY  08/2010        ??? HX CORONARY ARTERY BYPASS GRAFT  2007, 2012    2007: due to congenital heart disease - patients explanation is that right coronary artery was never in the proper location (perfused left side of heart only) and had repeat CABG surgery in 2012 to correct this   ??? HX OTHER SURGICAL  11/30/2013    TOOTH EXTRACTION         Family History:   Problem Relation Age of Onset   ??? Heart Disease Mother    ??? Cancer Mother         BRAIN CANCER   ??? Diabetes Father    ??? Diabetes Brother    ??? Heart Disease Maternal Grandmother    ??? Dementia Maternal Grandmother    ??? Heart Disease Maternal Grandfather    ??? Heart Disease Paternal Grandmother    ??? Heart Disease Paternal Grandfather    ??? No Known Problems Other    ??? Heart Disease Brother        Social History     Socioeconomic History   ??? Marital status: SINGLE  Spouse name: Not on file   ??? Number of children: 0   ??? Years of education: Not on file   ??? Highest education level: Not on file   Occupational History     Comment: DISABILITY   Tobacco Use   ??? Smoking status: Current Some Day Smoker     Packs/day: 0.25     Years: 30.00     Pack years: 7.50     Types: Cigarettes   ??? Smokeless tobacco: Former Neurosurgeon   ??? Tobacco comment: About 40-50 pack years, used to smoke 2 PPD but now down to 2 cigarettes a day   Substance and Sexual Activity   ??? Alcohol use: No   ??? Drug use: No   ??? Sexual activity: Not on file   Other Topics Concern   ??? Military Service Not Asked   ??? Blood Transfusions Not Asked   ??? Caffeine Concern Not Asked   ??? Occupational Exposure Not Asked   ??? Hobby Hazards Not Asked   ??? Sleep Concern Not Asked   ??? Stress Concern Not Asked   ??? Weight Concern Not Asked   ??? Special Diet Not Asked   ??? Back Care Not Asked   ??? Exercise Not Asked   ??? Bike Helmet Not Asked   ??? Seat Belt Not Asked   ??? Self-Exams Not Asked   Social History Narrative    He is from [place] but moved to [place] at age 71.  Has been between [place] and [address]    Lives with  wife, married since 2016     No children    Disability secondary to heart disease and syncopal episode    About 40-50 pack years, used to smoke 2 PPD but now down to 2 cigarettes a day    No EtOH    No drug use     Social Determinants of Psychologist, prison and probation services Strain:    ??? Difficulty of Paying Living Expenses: Not on file   Food Insecurity:    ??? Worried About Programme researcher, broadcasting/film/video in the Last Year: Not on file   ??? Ran Out of Food in the Last Year: Not on file   Transportation Needs:    ??? Freight forwarder (Medical): Not on file   ??? Lack of Transportation (Non-Medical): Not on file   Physical Activity:    ??? Days of Exercise per Week: Not on file   ??? Minutes of Exercise per Session: Not on file   Stress:    ??? Feeling of Stress : Not on file   Social Connections:    ??? Frequency of Communication with Friends and Family: Not on file   ??? Frequency of Social Gatherings with Friends and Family: Not on file   ??? Attends Religious Services: Not on file   ??? Active Member of Clubs or Organizations: Not on file   ??? Attends Banker Meetings: Not on file   ??? Marital Status: Not on file   Intimate Partner Violence:    ??? Fear of Current or Ex-Partner: Not on file   ??? Emotionally Abused: Not on file   ??? Physically Abused: Not on file   ??? Sexually Abused: Not on file   Housing Stability:    ??? Unable to Pay for Housing in the Last Year: Not on file   ??? Number of Places Lived in the Last Year: Not on file   ??? Unstable Housing in  the Last Year: Not on file         ALLERGIES: Coconut, Codeine, Morphine, Motrin [ibuprofen], Toradol [ketorolac], Tramadol, Iodinated contrast media, and Nitroglycerin    Review of Systems   Constitutional: Negative for fever.   HENT: Negative for sore throat.    Respiratory: Negative for cough and shortness of breath.    Cardiovascular: Positive for chest pain.   Gastrointestinal: Negative for abdominal pain.   Neurological: Negative for headaches.       Vitals:    07/26/20 0154   BP: (!)  153/91   Pulse: 87   Resp: 22   Temp: 100 ??F (37.8 ??C)   SpO2: 98%   Weight: 120.2 kg (265 lb)   Height: 6\' 4"  (1.93 m)            Physical Exam  Vitals and nursing note reviewed.   Constitutional:       Appearance: He is well-developed.   HENT:      Head: Normocephalic.   Eyes:      Pupils: Pupils are equal, round, and reactive to light.   Cardiovascular:      Rate and Rhythm: Normal rate.      Heart sounds: Normal heart sounds.   Pulmonary:      Effort: Pulmonary effort is normal.      Breath sounds: Normal breath sounds.   Abdominal:      General: Bowel sounds are normal.      Palpations: Abdomen is soft.   Musculoskeletal:         General: Normal range of motion.      Cervical back: Normal range of motion.   Skin:     General: Skin is warm.      Capillary Refill: Capillary refill takes less than 2 seconds.   Neurological:      General: No focal deficit present.      Mental Status: He is alert.          MDM  Number of Diagnoses or Management Options  Atypical chest pain  Diagnosis management comments: This is a 45 year old male who is here with atypical chest pain.  He has a history of chronic chest pain syndrome and has a history of drug-seeking behavior as well as eloping from either the hospital or emergency department.    His vital signs show no tachycardia or hypotension, tachypnea or hypoxia.    His clinical exam shows an alert male in no distress.  He has oropharynx well hydrated.  He has no JVD.  His lungs are clear.  He has no murmurs.  His chest is nontender.  His abdomen is soft and nontender.  His extremities show no deformity tenderness.  There is no calf tenderness.    EKG, as I review it, shows heart rate 92 with sinus rhythm.  QT intervals 414 milliseconds.  There is no ST elevation or ST depression.  QRS morphology is similar to an EKG from 09/10/2019.    I reviewed his medical records.  [facility] in the [facility] and had serial troponins and labs unremarkable.    His most recent  cardiology evaluation that I can see documented was on 07/26/19.  It was done by Dr. Joice LoftsMatthew Corbett at [facility].  According to his note, patient's most recent cardiac cath was on 10/02/2018 [facility] by Dr. Lahoma RockerSherman. There were mild irregularities of the relocated native RCA. Other vessels were normal.    Dr.  Corbett did not believe that patient's chest pain was cardiac.    I reviewed patient's recent workup at [facility] within the last 24 hours.  He had serial troponins unremarkable.  He has CBC CMP showing mild hyperglycemia.    The only issues that he does have a temperature 100??.  He does have a cousin who is in the hospital COVID 19.    Patient does not have respiratory distress.    I discussed my findings with the patient.  Patient agreed and consented to a chest x-ray and a troponin.    His chest x-ray, as I review it shows no obvious infiltrates or effusion.  Patient did consent to COVID 19.    Patient however changes mind and did not wish to stay in the hospital ED any longer.  He would like to have his IV removed.  Patient left prior to full evaluation.  He did not wish any further testing performed including to troponin.  This was cancelled.     His COVID test was negative.    I adamantly do not think he requires opiates and concerned about this behavior.  I strongly suspect chronic chest pain with drug-seeking behavior.    He should follow up with his cardiologist for further evaluation and management of his ongoing chest pain.       Amount and/or Complexity of Data Reviewed  Clinical lab tests: ordered and reviewed (Labs Reviewed  COVID-19,INFLUENZA A/B,RSV PANEL  )    Risk of Complications, Morbidity, and/or Mortality  Presenting problems: moderate  Diagnostic procedures: moderate  Management options: moderate    Patient Progress  Patient progress: stable         Procedures      NIH Stroke Scale

## 2020-07-26 NOTE — ED Notes (Signed)
Patient/Patient caregiver provided with verbal and paper discharge instructions. Patient/Patient caregiver verbally understands D/C information with no additional questions. Medication changes reviewed if applicable. Patient alert, calm, and cooperative at time of discharge. Patient left department Via Ambulation with steady gait.      VS reassessed 1 hour prior to discharge barring patient refusal of departure VS.      Pt, Provider, family/caregiver (as applicable) in agreement of discharge plan/ follow up if noted.      Patient instructed to follow up/return if needed

## 2020-07-26 NOTE — ED Notes (Signed)
Patient states CP x starting when he was walking.  Patient states it feels like his heart is being squeezed.  States significant cardiac history and he forgot his 81mg  Aspirin today.      Vaccinated against COVID.

## 2020-08-16 ENCOUNTER — Emergency Department: Admit: 2020-08-16 | Payer: PRIVATE HEALTH INSURANCE | Primary: Family Medicine

## 2020-08-16 ENCOUNTER — Inpatient Hospital Stay
Admit: 2020-08-16 | Discharge: 2020-08-16 | Discharge status: Against Medical Advice | Payer: PRIVATE HEALTH INSURANCE | Attending: Emergency Medicine

## 2020-08-16 DIAGNOSIS — R079 Chest pain, unspecified: Secondary | ICD-10-CM

## 2020-08-16 LAB — CBC WITH AUTO DIFFERENTIAL
Basophils %: 0 % (ref 0–2)
Basophils Absolute: 0 10*3/uL (ref 0.0–0.1)
Eosinophils %: 2 % (ref 0–5)
Eosinophils Absolute: 0.1 10*3/uL (ref 0.0–0.5)
Granulocyte Absolute Count: 0 10*3/uL (ref 0.00–0.04)
Hematocrit: 40.6 % — ABNORMAL LOW (ref 42.0–52.0)
Hemoglobin: 13.3 g/dL — ABNORMAL LOW (ref 14.0–18.0)
Immature Granulocytes: 0 % (ref 0.0–0.6)
Lymphocytes %: 36 % (ref 14–46)
Lymphocytes Absolute: 1.7 10*3/uL (ref 1.3–3.6)
MCH: 30 PG (ref 27.0–31.0)
MCHC: 32.8 g/dL — ABNORMAL LOW (ref 33.0–37.0)
MCV: 91.4 FL (ref 80.0–94.0)
MPV: 10.6 FL — ABNORMAL HIGH (ref 7.4–10.4)
Monocytes %: 7 % (ref 5–12)
Monocytes Absolute: 0.3 10*3/uL (ref 0.3–0.8)
NRBC Absolute: 0 10*3/uL
Neutrophils %: 55 % (ref 47–80)
Neutrophils Absolute: 2.7 10*3/uL (ref 1.6–6.1)
Nucleated RBCs: 0 PER 100 WBC
Platelet Estimate: ADEQUATE
Platelets: 204 10*3/uL (ref 130–400)
RBC Comment: NORMAL
RBC: 4.44 M/uL — ABNORMAL LOW (ref 4.70–6.10)
RDW: 12.4 % (ref 11.5–14.5)
WBC: 4.8 10*3/uL (ref 4.5–10.9)

## 2020-08-16 LAB — COMPREHENSIVE METABOLIC PANEL
ALT: 107 U/L — ABNORMAL HIGH (ref 12–78)
AST: 52 U/L — ABNORMAL HIGH (ref 10–37)
Albumin: 3.3 g/dL — ABNORMAL LOW (ref 3.4–5.0)
Alkaline Phosphatase: 59 U/L (ref 43–117)
BUN: 14 MG/DL (ref 7–22)
CO2: 26 mmol/L (ref 21–32)
Calcium: 9.3 MG/DL (ref 8.5–10.1)
Chloride: 108 mmol/L (ref 98–108)
Creatinine: 0.78 MG/DL (ref 0.70–1.30)
EGFR IF NonAfrican American: >60 mL/min/{1.73_m2} (ref >60)
GFR African American: >60 mL/min/{1.73_m2} (ref >60)
Glucose: 215 mg/dL — ABNORMAL HIGH (ref 74–106)
Potassium: 3.8 mmol/L (ref 3.4–5.1)
Sodium: 137 mmol/L (ref 136–145)
Total Bilirubin: 0.3 mg/dL (ref 0.00–1.00)
Total Protein: 7.4 g/dL (ref 6.4–8.2)

## 2020-08-16 LAB — TROPONIN, HIGH SENSITIVITY: Troponin, High Sensitivity: 39.8 pg/mL (ref 0.0–78.0)

## 2020-08-16 LAB — CBC WITH AUTOMATED DIFF
ABS. BASOPHILS: 0 10*3/uL (ref 0.0–0.1)
ABS. EOSINOPHILS: 0.1 10*3/uL (ref 0.0–0.5)
ABS. IMM. GRANS.: 0 10*3/uL (ref 0.00–0.04)
ABS. LYMPHOCYTES: 1.7 10*3/uL (ref 1.3–3.6)
ABS. MONOCYTES: 0.3 10*3/uL (ref 0.3–0.8)
ABS. NEUTROPHILS: 2.7 10*3/uL (ref 1.6–6.1)
ABSOLUTE NRBC: 0 10*3/uL
BASOPHILS: 0 % (ref 0–2)
EOSINOPHILS: 2 % (ref 0–5)
HCT: 40.6 % — ABNORMAL LOW (ref 42.0–52.0)
HGB: 13.3 g/dL — ABNORMAL LOW (ref 14.0–18.0)
IMMATURE GRANULOCYTES: 0 % (ref 0.0–0.6)
LYMPHOCYTES: 36 % (ref 14–46)
MCH: 30 PG (ref 27.0–31.0)
MCHC: 32.8 g/dL — ABNORMAL LOW (ref 33.0–37.0)
MCV: 91.4 FL (ref 80.0–94.0)
MONOCYTES: 7 % (ref 5–12)
MPV: 10.6 FL — ABNORMAL HIGH (ref 7.4–10.4)
NEUTROPHILS: 55 % (ref 47–80)
NRBC: 0 PER 100 WBC
PLATELET ESTIMATE: ADEQUATE
PLATELET: 204 10*3/uL (ref 130–400)
RBC COMMENTS: NORMAL
RBC: 4.44 M/uL — ABNORMAL LOW (ref 4.70–6.10)
RDW: 12.4 % (ref 11.5–14.5)
WBC: 4.8 10*3/uL (ref 4.5–10.9)

## 2020-08-16 LAB — METABOLIC PANEL, COMPREHENSIVE
ALT (SGPT): 107 U/L — ABNORMAL HIGH (ref 12–78)
AST (SGOT): 52 U/L — ABNORMAL HIGH (ref 10–37)
Albumin: 3.3 g/dL — ABNORMAL LOW (ref 3.4–5.0)
Alk. phosphatase: 59 U/L (ref 43–117)
BUN: 14 MG/DL (ref 7–22)
Bilirubin, total: 0.3 mg/dL (ref 0.00–1.00)
CO2: 26 mmol/L (ref 21–32)
Calcium: 9.3 MG/DL (ref 8.5–10.1)
Chloride: 108 mmol/L (ref 98–108)
Creatinine: 0.78 MG/DL (ref 0.70–1.30)
GFR est AA: >60 mL/min/{1.73_m2} (ref >60)
GFR est non-AA: >60 mL/min/{1.73_m2} (ref >60)
Glucose: 215 mg/dL — ABNORMAL HIGH (ref 74–106)
Potassium: 3.8 mmol/L (ref 3.4–5.1)
Protein, total: 7.4 g/dL (ref 6.4–8.2)
Sodium: 137 mmol/L (ref 136–145)

## 2020-08-16 LAB — TROPONIN-HIGH SENSITIVITY: Troponin-High Sensitivity: 39.8 pg/mL (ref 0.0–78.0)

## 2020-08-16 MED ORDER — ONDANSETRON (PF) 4 MG/2 ML INJECTION
4 mg/2 mL | Freq: Once | INTRAMUSCULAR | Status: AC
Start: 2020-08-16 — End: 2020-08-16
  Administered 2020-08-16: 21:00:00 via INTRAVENOUS

## 2020-08-16 MED ORDER — MORPHINE 2 MG/ML INJECTION
2 mg/mL | Freq: Once | INTRAMUSCULAR | Status: AC
Start: 2020-08-16 — End: 2020-08-16
  Administered 2020-08-16: 21:00:00 via INTRAVENOUS

## 2020-08-16 MED FILL — MORPHINE 2 MG/ML INJECTION: 2 mg/mL | INTRAMUSCULAR | Qty: 2

## 2020-08-16 MED FILL — ONDANSETRON (PF) 4 MG/2 ML INJECTION: 4 mg/2 mL | INTRAMUSCULAR | Qty: 2

## 2020-08-16 NOTE — ED Notes (Signed)
 Pt rang saying he wanted to speak with the nurse. This RN went in; pt dressed stating he is leaving and does not want to stay for a stress test, states we are messing with his heart condition and he will go see his cardiologist in [place]. Provider aware, reviewed risks of leaving with pt; pt signed AMA form witnessed by this RN.

## 2020-08-16 NOTE — ED Notes (Signed)
 Pt rates pain 7/10; states feeling much better after the morphine.

## 2020-08-16 NOTE — Progress Notes (Signed)
ADMISSION: MED RECONCILIATION    Comments/Recommendations: This patient was interviewed at bedside. He was a difficult historian in the fact that he did not want to give me the information about where he is currently getting his medications. [facility] did not have any of the medications that the patient states that he is currently taking as being dispensed and I was not able to get through to speak to the pharmacy at [facility] where he states that he is currently going for prescriptions when he is not in [place].    The medications below were on the PTA list at his recent ED visits since 12/07/2019.  Please review the PTA list below.    Medications added:     N/A    Medications removed:    Aluminum Hydroxide/Diphenhydramine/Lidocaine swish and swallow      Medications adjusted:    Metoprolol XL 50 mg: take 1 tablet by mouth daily; chg'd to Metoprolol SL 50 mg: take 1/2 tablet (25 mg) by mouth twice daily.    Information obtained from: Patient, [facility], Care Everywhere, and [place] PMP    Allergies: Coconut, Codeine, Motrin [ibuprofen], Toradol [ketorolac], Tramadol, Iodinated contrast media, and Nitroglycerin    Prior to Admission Medications:     Medication Documentation Review Audit       Reviewed by Mattie Marlin, Theotis Burrow (Technician) on 08/16/20 at 1717      Medication Sig Documenting Provider Last Dose Status Taking?   aspirin 81 mg chewable tablet Take 324 mg by mouth daily. Provider, Historical  Active    atorvastatin (LIPITOR) 40 mg tablet Take 40 mg by mouth daily. Provider, Historical  Active Yes   metFORMIN (GLUCOPHAGE) 500 mg tablet Take  by mouth two (2) times daily (with meals).   Patient not taking: Reported on 08/16/2020    Provider, Historical Not Taking Unknown time Active No           Med Note [facility]   Tue Aug 16, 2020  5:12 PM) Patient is not taking medication   metoprolol succinate (TOPROL-XL) 50 mg XL tablet Take 25 mg by mouth daily. Take 1/2 tablet (25 mg) by  mouth daily. Provider, Historical  Active Yes           Med Note [facility]   Tue Aug 16, 2020  5:15 PM) Patient is taking 1/2 tablet (25 mg) by mouth twice daily   naloxone (NARCAN) 4 mg/actuation nasal spray 1 Spray by IntraNASal route once as needed for Overdose. Use 1 spray intranasally, then discard. Repeat with new spray every 2 min as needed for opioid overdose symptoms, alternating nostrils.   Patient not taking: Reported on 08/16/2020    Provider, Historical Not Taking Unknown time Active No                      Jackson Latino   Contact: [phone]  Reviewed by Lilyan Punt, PharmD Contact; [phone] or via [employer]

## 2020-08-16 NOTE — ED Provider Notes (Signed)
This is a 45 year old male presenting with chest pain.        About an hour prior to arrival, the patient states that he was walking, and on his walk he developed left-sided chest pain that radiated to his left jaw and down his left arm, pain that he describes as a sharp and squeezing sensation associated with lightheadedness, sweats, nausea.  He had no vomiting with it did not radiate to the right side in he did not feel short of breath with it.  He states when he initially arrived here was a 7/10 pain now as a 10/10 pain.  It does not radiate to the back.  He states he has not had chest pain like this in the past.    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  Says quit to that he had a CABG times to in 2012 due to RCA disease in 80% blockage.  His last stress test was in 2012.  He has seen Dr. previously from Cardiology in the past.  He takes 324 mg of aspirin daily, number show prolonged and Lipitor.  He has cut back his cigarette intake from 2 packs a day to 2 cigarettes per day.  He does have remote history of a DVT his right upper extremity perioperatively.    Recently he has had no fever and no cough.  He has had no leg pain or swelling.  No vomiting or diarrhea.    He has multiple allergies including to nitroglycerin which causes syncope and hives.  He has allergic to NSAIDs.    He says he has seen Dr. previously in the past as well as a cardiologist in [place] who he sees on annual basis.    He states his mother has brain cancer,, his father has an enlarged heart.               Past Medical History:   Diagnosis Date   ??? CAD (coronary artery disease)     MI x 3   ??? Cardiac revascularization with aortocoronary bypass anastomosis    ??? Chronic dental pain     - multiple teeth remove   ??? Chronic knee pain    ??? Hypertension    ??? Myocardial infarct [facility])    ??? S/P CABG x 1 2007    CABG x 1, 2007, due to congenital heart disease--patient's explanation is that right  coronary artery was never in the proper location (perfused left side of heart only) and he had repeat cabage surgery in 2012 to correct this.   ??? Thromboembolus [facility])        Past Surgical History:   Procedure Laterality Date   ??? HX CHOLECYSTECTOMY  08/2010        ??? HX CORONARY ARTERY BYPASS GRAFT  2007, 2012    2007: due to congenital heart disease - patients explanation is that right coronary artery was never in the proper location (perfused left side of heart only) and had repeat CABG surgery in 2012 to correct this   ??? HX OTHER SURGICAL  11/30/2013    TOOTH EXTRACTION         Family History:   Problem Relation Age of Onset   ??? Heart Disease Mother    ??? Cancer Mother         BRAIN CANCER   ??? Diabetes Father    ??? Diabetes Brother    ??? Heart Disease Maternal Grandmother    ???  Dementia Maternal Grandmother    ??? Heart Disease Maternal Grandfather    ??? Heart Disease Paternal Grandmother    ??? Heart Disease Paternal Grandfather    ??? No Known Problems Other    ??? Heart Disease Brother        Social History     Socioeconomic History   ??? Marital status: MARRIED     Spouse name: Not on file   ??? Number of children: 0   ??? Years of education: Not on file   ??? Highest education level: Not on file   Occupational History     Comment: DISABILITY   Tobacco Use   ??? Smoking status: Current Some Day Smoker     Packs/day: 0.25     Years: 30.00     Pack years: 7.50     Types: Cigarettes   ??? Smokeless tobacco: Former Neurosurgeon   ??? Tobacco comment: About 40-50 pack years, used to smoke 2 PPD but now down to 2 cigarettes a day   Substance and Sexual Activity   ??? Alcohol use: No   ??? Drug use: No   ??? Sexual activity: Not on file   Other Topics Concern   ??? Military Service Not Asked   ??? Blood Transfusions Not Asked   ??? Caffeine Concern Not Asked   ??? Occupational Exposure Not Asked   ??? Hobby Hazards Not Asked   ??? Sleep Concern Not Asked   ??? Stress Concern Not Asked   ??? Weight Concern Not Asked   ??? Special Diet Not Asked   ??? Back Care Not Asked   ???  Exercise Not Asked   ??? Bike Helmet Not Asked   ??? Seat Belt Not Asked   ??? Self-Exams Not Asked   Social History Narrative    He is from [place] but moved to [place] at age 54.  Has been between [place] and [address]    Lives with wife, married since 2016     No children    Disability secondary to heart disease and syncopal episode    About 40-50 pack years, used to smoke 2 PPD but now down to 2 cigarettes a day    No EtOH    No drug use     Social Determinants of Psychologist, prison and probation services Strain:    ??? Difficulty of Paying Living Expenses: Not on file   Food Insecurity:    ??? Worried About Programme researcher, broadcasting/film/video in the Last Year: Not on file   ??? Ran Out of Food in the Last Year: Not on file   Transportation Needs:    ??? Freight forwarder (Medical): Not on file   ??? Lack of Transportation (Non-Medical): Not on file   Physical Activity:    ??? Days of Exercise per Week: Not on file   ??? Minutes of Exercise per Session: Not on file   Stress:    ??? Feeling of Stress : Not on file   Social Connections:    ??? Frequency of Communication with Friends and Family: Not on file   ??? Frequency of Social Gatherings with Friends and Family: Not on file   ??? Attends Religious Services: Not on file   ??? Active Member of Clubs or Organizations: Not on file   ??? Attends Banker Meetings: Not on file   ??? Marital Status: Not on file   Intimate Partner Violence:    ??? Fear of Current or Ex-Partner:  Not on file   ??? Emotionally Abused: Not on file   ??? Physically Abused: Not on file   ??? Sexually Abused: Not on file   Housing Stability:    ??? Unable to Pay for Housing in the Last Year: Not on file   ??? Number of Places Lived in the Last Year: Not on file   ??? Unstable Housing in the Last Year: Not on file         ALLERGIES: Coconut, Codeine, Motrin [ibuprofen], Toradol [ketorolac], Tramadol, Iodinated contrast media, and Nitroglycerin    Review of Systems   All other systems reviewed and are negative.      Vitals:    08/16/20 1553  08/16/20 1601 08/16/20 1801   BP: (!) 156/86 (!) 142/80 125/73   Pulse: 75 80 74   Resp: 19 25 16    Temp: 99.1 ??F (37.3 ??C)     SpO2: 96% 95%    Weight: 117.9 kg (260 lb)     Height: 6\' 4"  (1.93 m)              Physical Exam  Vitals and nursing note reviewed.   Constitutional:       General: He is not in acute distress.     Appearance: He is well-developed. He is not diaphoretic.   HENT:      Head: Normocephalic and atraumatic.      Nose: Nose normal.   Eyes:      Extraocular Movements: Extraocular movements intact.   Cardiovascular:      Rate and Rhythm: Normal rate and regular rhythm.      Heart sounds: Normal heart sounds. No murmur heard.      Pulmonary:      Effort: Pulmonary effort is normal. No respiratory distress.      Breath sounds: Normal breath sounds. No wheezing or rales.   Abdominal:      General: There is no distension.      Palpations: Abdomen is soft.      Tenderness: There is no abdominal tenderness. There is no guarding or rebound.   Musculoskeletal:         General: Normal range of motion.      Cervical back: Normal range of motion.   Skin:     General: Skin is warm and dry.      Coloration: Skin is not pale.      Findings: No erythema or rash.   Neurological:      Mental Status: He is alert and oriented to person, place, and time. Mental status is at baseline.   Psychiatric:         Mood and Affect: Mood normal.         Behavior: Behavior normal.          MDM     Well-appearing 45 year old male presenting with chest pain.    ECG upon arrival shows no ischemic injury pattern.  However given his symptoms and history, troponins ordered.  First HS troponin was normal at 39.8.  Second troponin was ordered for 7:40 p.m..    CBC, CMP unremarkable.    Chest x-ray unremarkable.  Symptoms not consistent with aortic dissection, pneumothorax, pneumonia, COVID, esophageal perforation.    After further reviewing the patient's chart in [facility], he was placed in the ED observation unit/CDU for chest pain on the  23rd through the 24th of March and discharged against medical advice, after he refused to have a MIBI:    "Patient initially  evaluated in the emergency department yesterday for episode of chest pain. Per the ED note patient was driving around with significant other earlier today and started having chest pain. Noted secondary complaints of nausea, lightheadedness and jaw pain. Pain did not radiate to the back. No specific aggravating or alleviating factors.    EKG was nonischemic. Troponin testing was negative. Case was discussed with cardiology who recommended CDU admission, MIBI.    No acute telemetry events noted overnight. Patient continued to have some chest pain. Received dose of 1 mg Dilaudid at 7:00 this morning for recurrent symptoms.    The technician for provocative testing came down to see the patient and at that point patient then refused to be injected and said he would not go forward with stress testing. His primary concern was that this was not an exercise stress test. He is unwilling to have injection/chemical stress test.    Reengaged cardiology and spoke with nurse practitioner, Cornelia CopaSamantha Fields, who also spoke with the to attending cardiologist. As patient has received pain medication they stated they are unable to do exercise treadmill test today. They will only provide stress test it is a chemical test today. They stated this is not a specific protocol but just a case-by-case basis and at this point they are unable to provide exercise MIBI for this patient today.    Patient initially amenable to chemical stress test but when the tech has come to retrieve him for the test again he now again refuses this test. Patient states he is unwilling to undergo this sort of test. He verbalizes that it is the ideal study of choice currently for diagnostic purposes however states he will not undergo the chemical injection as he does not wish to have a side effects despite acknowledging that it may last a few  minutes.    Had conversation with the patient noting that given a few different hospital  visits at outside hospitals and recurrence of symptoms he really needs to get the definitive cardiac testing done. The patient became very frustrated and states he had not been evaluated previously for this chest pain within the last year. Patient then stated that I was incorrect in my review of his medical records.    Patient became frustrated with this discussion and noted that he is unwilling to discuss this any further. He notes he is now unwilling to stay for any sort of additional testing as he is frustrated with the prior review of his chart. Patient notes that he understands that without additional work-up I cannot rule out new ischemic disease, worsening of underlying disease, risk for current MI, risk for CHF, risk for arrhythmia. Cannot rule out potential chronic disability, death. Patient is aware that if there is new disease going on that is undiagnosed it could have potentially been treated in a less invasive manner than if his diagnosis is delayed.    Patient continues to be unwilling to stay for additional testing, he is taking  off his cardiac leads and requesting IV removal. Patient specifically states  that there is nothing else that I can say to keep him in the hospital for  definitive testing.    Patient has left AMA. Has signed form.    Outpatient exercise MIBI ordered for this patient, which he notes he is willing to consider."    I discussed this with the patient at 6:50 p.m..  The patient disputes this account of what happened.  He states that a stress test  was never offered to him even though he was kept overnight in their CDU.  He states he did talk to [facility] and was told that no outpatient stress test was ordered for him.    Initial plan was to consider provocative testing however the patient just had an ED observation stay over the weekend and according to notes did not want to  participate in the nuclear stress test but does have outpatient testing ordered for him.  The patient was initially agreeable to stay for 2nd troponin and then cardiology consultation, but after further discussion of his experience at [facility], the patient left against medical advice.  He wants to go back to [place] to see his cardiologist there.  He is aware that he should call his cardiologist this is possible here in [place] or his primary care provider who also is in [place].  He should return to the ER if he has any further worsening of the chest pain, difficulty breathing fainting or new concerns.  He has a normal mental status, normal vitals, and understands the basic information regarding the acute medical issues, risks and consequences of leaving, including permanent disability and/or death as well as heart failure.    Vital Signs for this visit:  Patient Vitals for the past 12 hrs:   Temp Pulse Resp BP SpO2   08/16/20 1801 ??? 74 16 125/73 ???   08/16/20 1601 ??? 80 25 (!) 142/80 95 %   08/16/20 1553 99.1 ??F (37.3 ??C) 75 19 (!) 156/86 96 %       Lab findings during this visit (only abnormal values will be noted, if no value noted then the result was normal range):  Labs Reviewed   CBC WITH AUTOMATED DIFF - Abnormal; Notable for the following components:       Result Value    RBC 4.44 (*)     HGB 13.3 (*)     HCT 40.6 (*)     MCHC 32.8 (*)     MPV 10.6 (*)     All other components within normal limits   METABOLIC PANEL, COMPREHENSIVE - Abnormal; Notable for the following components:    Glucose 215 (*)     ALT (SGPT) 107 (*)     AST (SGOT) 52 (*)     Albumin 3.3 (*)     All other components within normal limits   TROPONIN-HIGH SENSITIVITY   TROPONIN-HIGH SENSITIVITY       Radiology studies during this visit  No results found.    Medications given in the ED:  Medications   ondansetron (ZOFRAN) injection 4 mg (4 mg IntraVENous Given 08/16/20 1721)   morphine injection 4 mg (4 mg IntraVENous Given 08/16/20 1721)        Diagnosis:    ICD-10-CM ICD-9-CM   1. Chest pain, unspecified type  R07.9 786.50         Disposition:  AMA    Discharge prescriptions and/or changes if applicable:    Discharge Medication List as of 08/16/2020  7:11 PM          Follow-up:  No follow-up provider specified.    Please note that portions of this document were created using the M*Modal Fluency Direct dictation system.  Any inconsistencies or typographical errors may be the result of mis-transcription that persist in spite of proof-reading and should be addressed with the document creator.             ED Course as of 08/16/20  9604   Tue Aug 16, 2020   1835 Single-view chest x-ray:  No infiltrate pneumothorax or pleural effusion, unchanged compared to chest x-ray from March 24th. [JP]      ED Course User Index  [JP] Bobbe Medico, MD       EKG    Date/Time: 08/16/2020 3:54 PM  Performed by: Bobbe Medico, MD  Authorized by: Bobbe Medico, MD     Previous ECG:     Previous ECG:  Compared to current    Comparison ECG info:  07/26/2020    Similarity:  Changes noted  Interpretation:     Interpretation: non-specific    Rate:     ECG rate:  82    ECG rate assessment: normal    Rhythm:     Rhythm: sinus rhythm    Ectopy:     Ectopy: PVCs      PVCs:  Infrequent  QRS:     QRS axis:  Normal    QRS intervals:  Normal  Conduction:     Conduction: normal    ST segments:     ST segments:  Normal  T waves:     T waves: normal    Comments:      Normal sinus rhythm with a rate of 82. Normal intervals.  No ST elevation or depression.  Two PVCs.  Some artifact.  Repeat ECG was obtained.  EKG    Date/Time: 08/16/2020 5:02 PM  Performed by: Bobbe Medico, MD  Authorized by: Bobbe Medico, MD     Previous ECG:     Previous ECG:  Compared to current    Comparison ECG info:  Earlier today    Similarity:  No change  Interpretation:     Interpretation: normal    Rate:     ECG rate:  60    ECG rate assessment: normal    Rhythm:     Rhythm: sinus rhythm     Ectopy:     Ectopy: none    QRS:     QRS axis:  Normal    QRS intervals:  Normal  Conduction:     Conduction: normal    ST segments:     ST segments:  Normal  T waves:     T waves: normal    Comments:      Normal sinus rhythm with a rate of 60. Normal intervals.  No ST elevation or depression.  T-wave inversion in aVL unchanged compared to previous ECG from 07/26/2020.

## 2020-08-16 NOTE — ED Notes (Signed)
 Arrives via EMS from urgent care. Pt has hx of three MI's; began having left sided squeezing chest pain approximately 25 minutes prior to arrival while taking a light walk. Pt rates pain at 9/10 on arrival, reports nausea. Received 324mg  aspirin from EMS, pt has nitro allergy.

## 2020-08-16 NOTE — ED Notes (Signed)
Pt left ambulating independently with steady gait.

## 2020-08-17 NOTE — Telephone Encounter (Signed)
Pt was recently in ER for chest pain, left early.   He has poorly controlled DM   He should have an appointment with me soon.

## 2020-08-17 NOTE — Telephone Encounter (Signed)
Pt was busy when I called and he will call back to schedule appt.     Thanks  Noreene Larsson

## 2020-08-19 LAB — MICROALBUMIN URINE (EXT)
Albumin, urine (INT/EXT): 2.3 mg/dL — ABNORMAL HIGH (ref 0.0–2.0)
Creatinine, urine, random (INT/EXT): 83 mg/dL (ref 40–278)
Microalbumin/Creatinine Ratio Urine (EXT): 27.7 mg/g{creat} (ref 0.0–30.0)

## 2020-08-22 LAB — BMP (EXT)
Anion Gap (EXT): 0 mmol/L — ABNORMAL LOW (ref 5–15)
BUN (EXT): 14 mg/dL (ref 6–26)
CO2 (EXT): 30 mmol/L (ref 21–31)
CalciumCalcium (EXT): 9.3 mg/dL (ref 8.2–10.2)
Chloride (EXT): 109 mmol/L (ref 96–110)
Creatinine (EXT): 0.86 mg/dL (ref 0.67–1.17)
GFR Estimated (Calc) (EXT): 111 mL/min/{1.73_m2} (ref >59)
Glucose (EXT): 144 mg/dL — ABNORMAL HIGH (ref 65–99)
Potassium (EXT): 4.4 mmol/L (ref 3.5–5.3)
Sodium (EXT): 139 mmol/L (ref 134–146)

## 2020-08-25 NOTE — Telephone Encounter (Signed)
Attempted to call Pt to schedule appt.  Pt not available, person who answered phone hung up when asked when [facility] would be available.      Several unsuccessful attempts to reach Pt.  Sending letter requesting Pt call office to schedule appt.    Thank you,  Beverlee Nims

## 2020-08-25 NOTE — Telephone Encounter (Signed)
Called and spoke with Pt's brother.  Pt will not be available until later today, around 6:00 pm.  Will try back later.    Thank you,  Beverlee Nims

## 2020-08-30 IMAGING — CR DG CHEST 2V
2 series · 2 of 2 positions shown · non-contrast
Comparison: 09/22/2019

CLINICAL DATA: Chest pain.  Headache

EXAM:
CHEST - 2 VIEW

[chest pa]
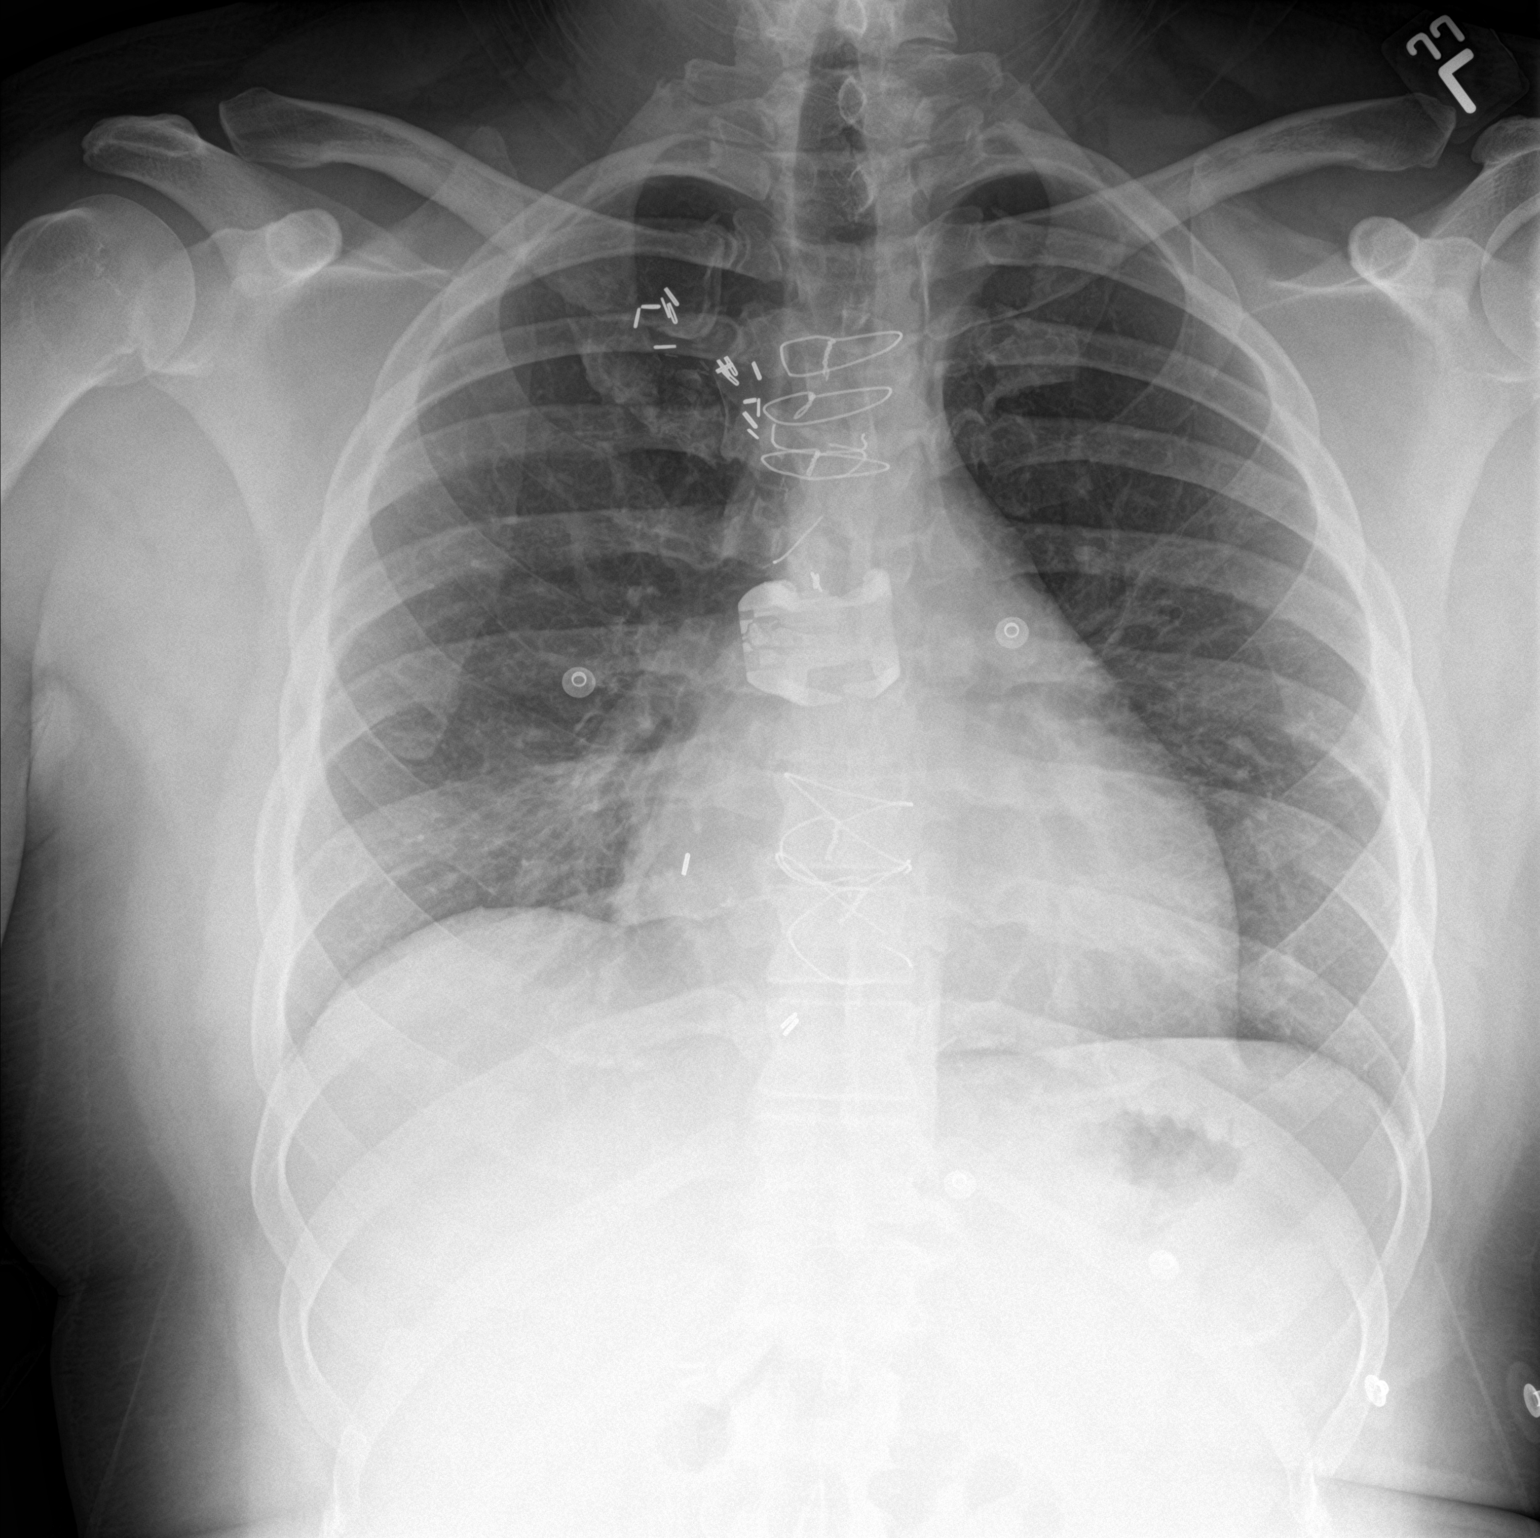

[chest lat]
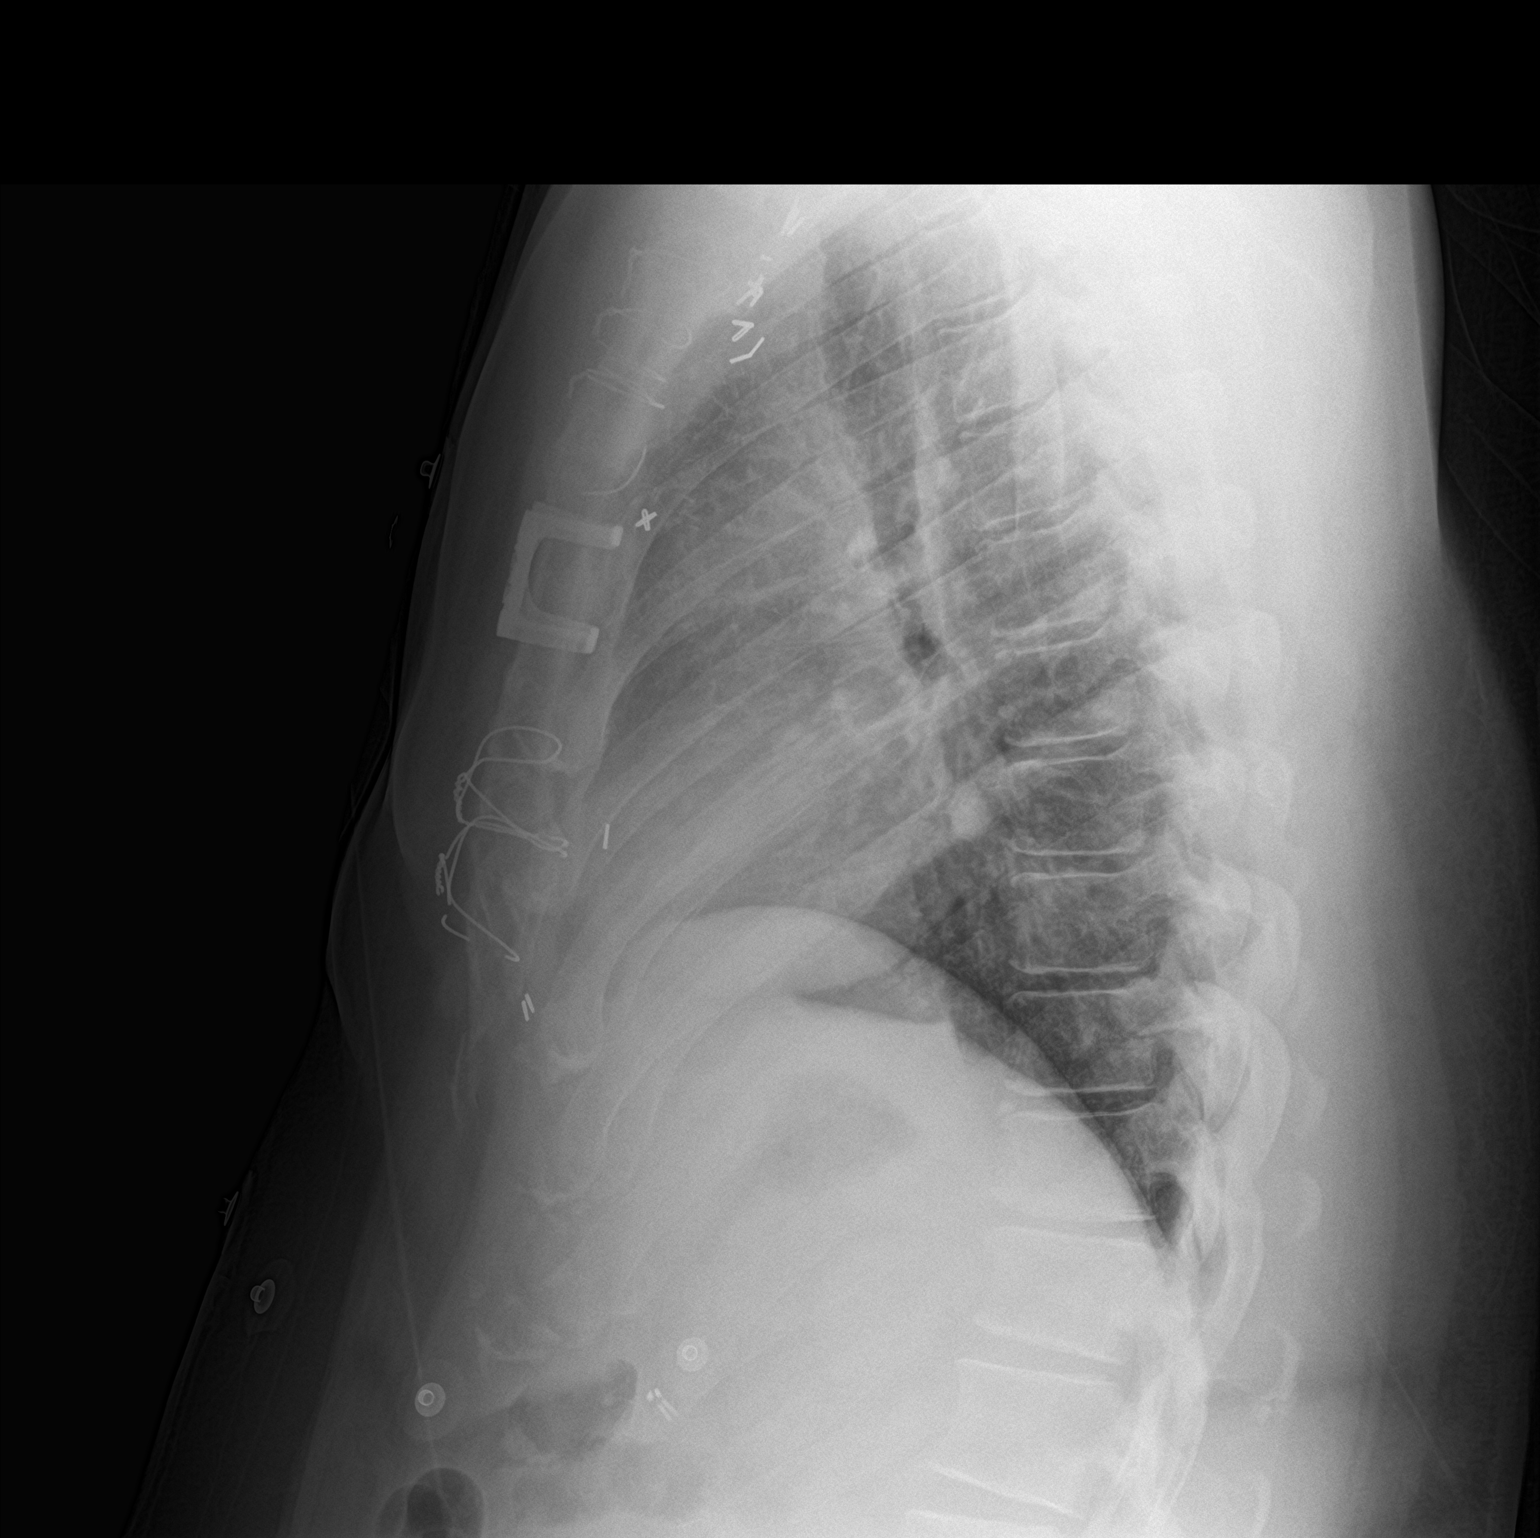

[2 of 2 positions shown; findings below may reference images not displayed]

FINDINGS: Midline sternotomy. Normal mediastinum and cardiac silhouette. No
effusion infiltrate pneumothorax. Rounded 9 mm density inferior to
the carina suggest a lymph node or vessel.
IMPRESSION: No active cardiopulmonary disease.

## 2020-08-31 LAB — CHOLESTEROL/HDL RATIO (EXT)
Chol/HDL Ratio (EXT): 5 ratio
Cholesterol (EXT): 212 mg/dL
HDL Cholesterol (EXT): 42 mg/dL

## 2020-08-31 LAB — LDL (EXT): LDL Cholesterol (EXT): 162 mg/dL

## 2020-08-31 LAB — HEMOGLOBIN A1C
Estimated Average Glucose mg/dL (INT/EXT): 184 mg/dL
HEMOGLOBIN A1C % (INT/EXT): 8 % — ABNORMAL HIGH (ref 4.3–5.6)

## 2020-08-31 LAB — UNMAPPED LAB RESULTS
Glucose (EXT): 114 mg/dL — ABNORMAL HIGH (ref 65–99)
Triglycerides (EXT): 126 mg/dL

## 2020-08-31 LAB — HEMOGLOBIN A1C CARE EVERYWHERE
ESTIMATED AVERAGE GLUCOSE CARE EVERYWHERE: 184 mg/dL
HEMOGLOBIN A1C CARE EVERYWHERE: 8 % — ABNORMAL HIGH (ref 4.3–5.6)

## 2020-08-31 IMAGING — DX DG CHEST 2V
2 series · 2 of 2 positions shown · non-contrast
Comparison: 09/23/2019

CLINICAL DATA: Left chest pain

EXAM:
CHEST - 2 VIEW

[chest pa]
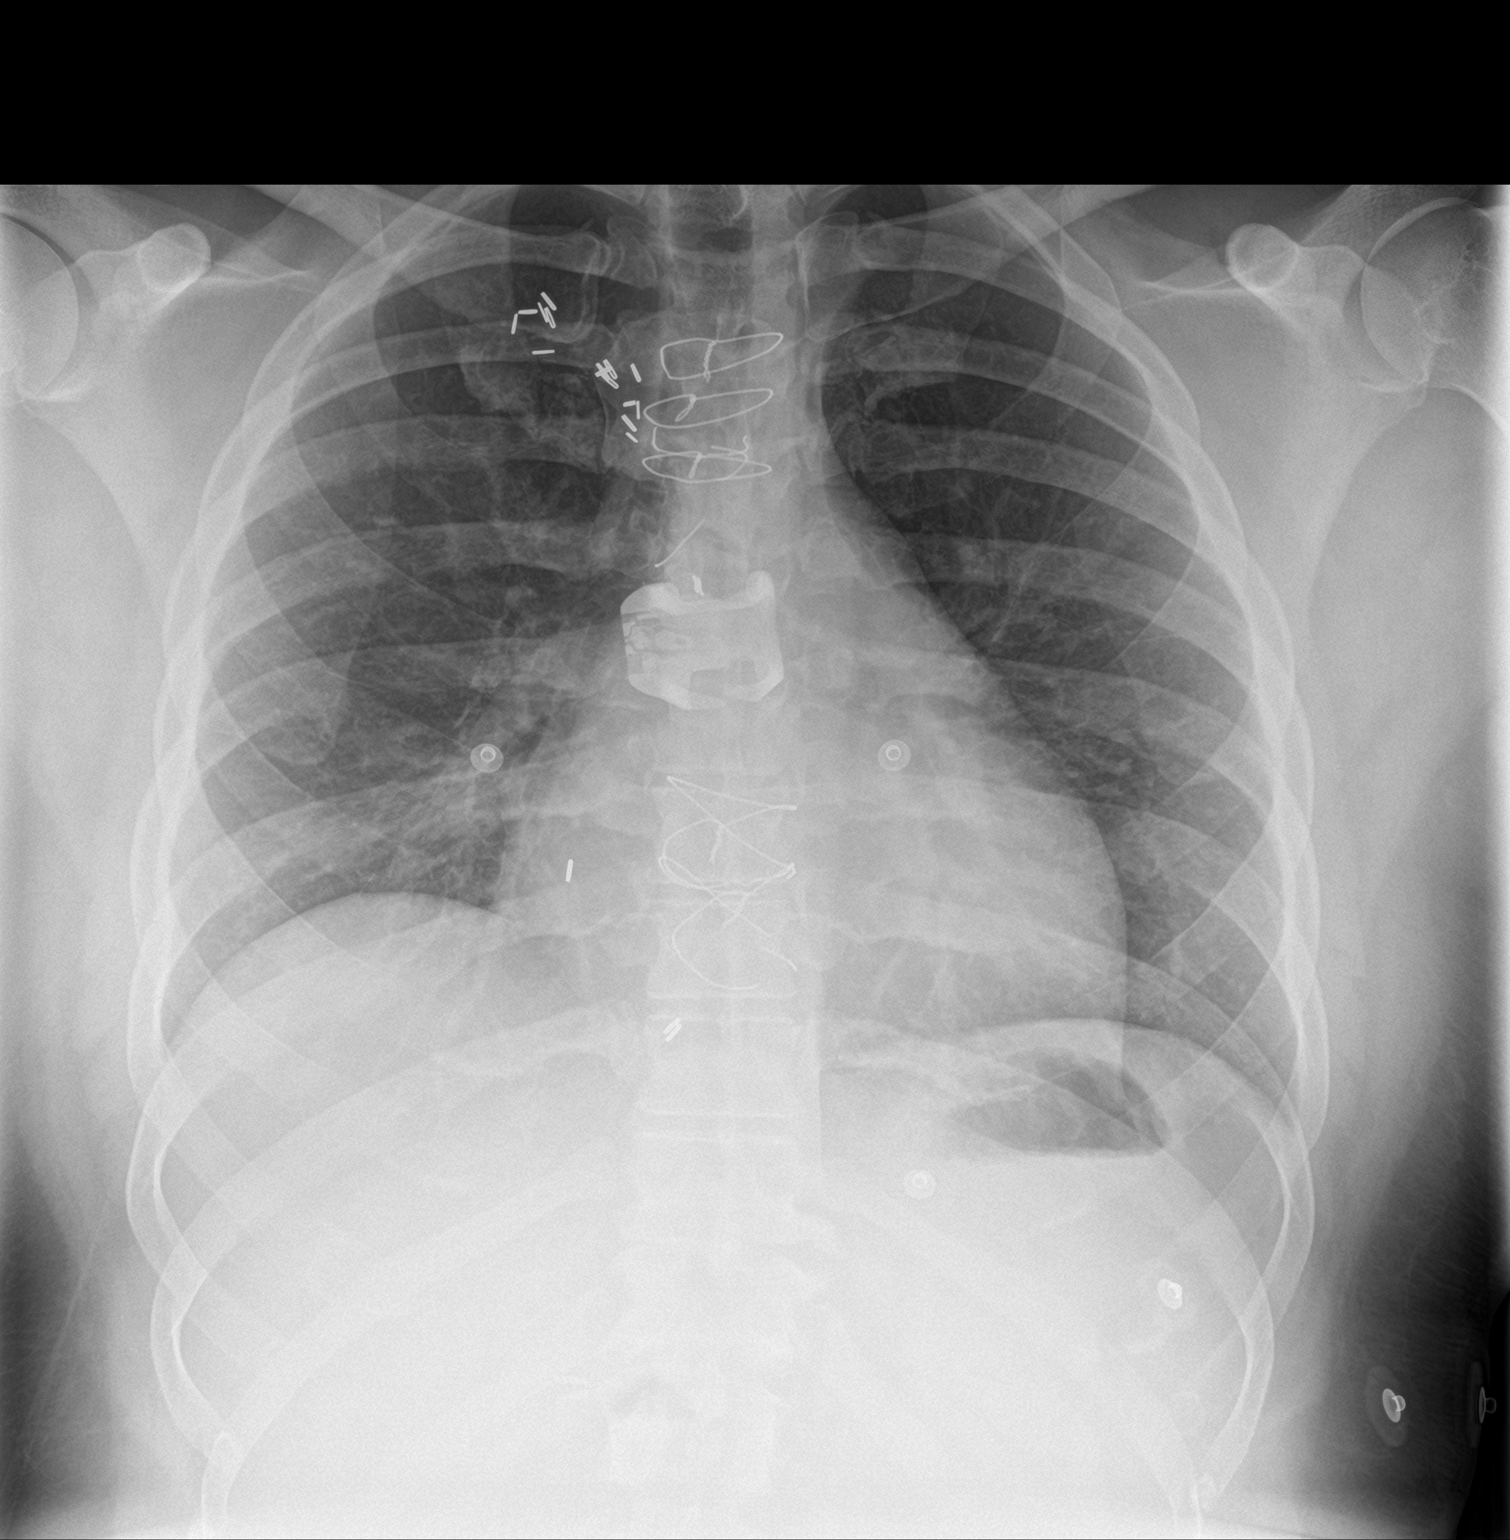

[chest lat]
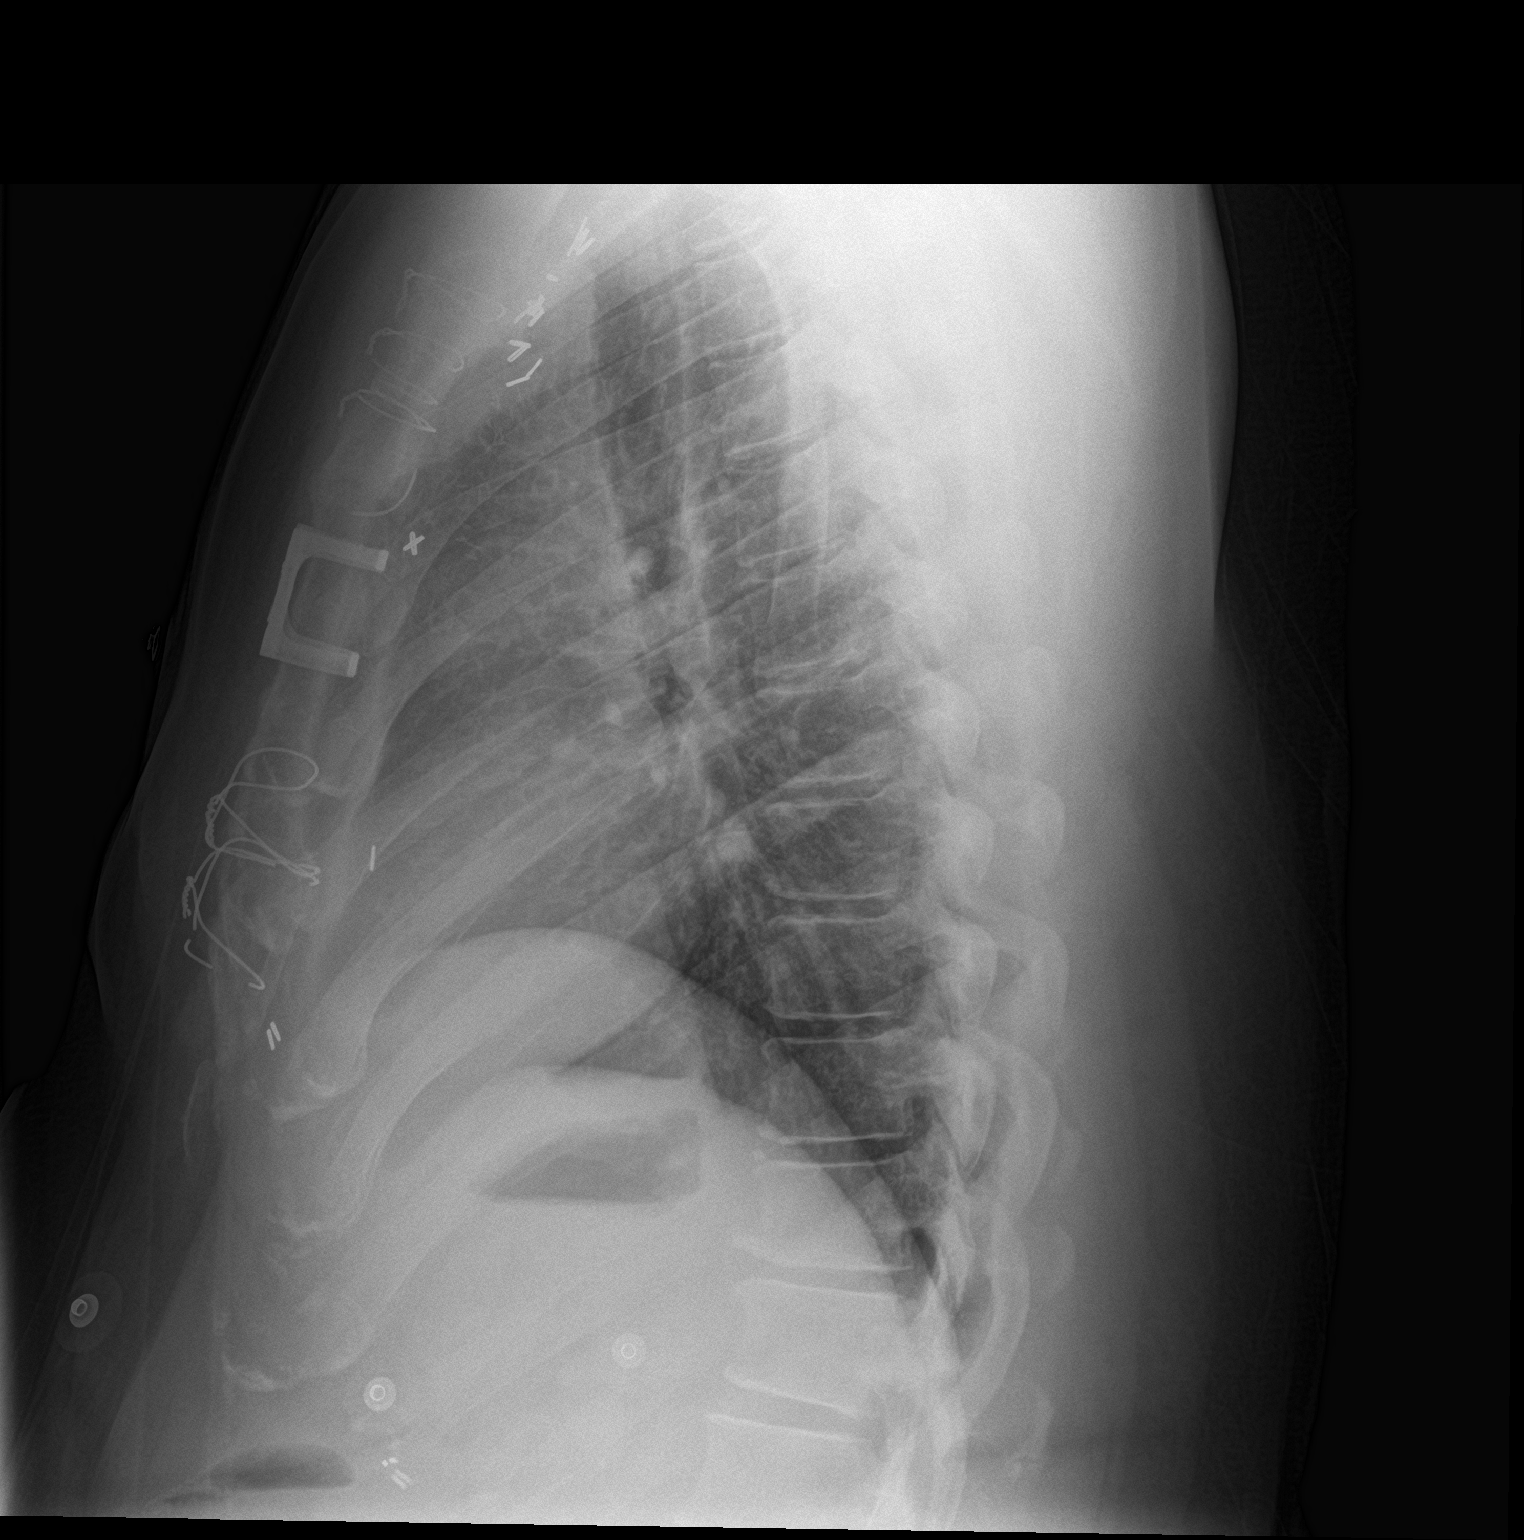

[2 of 2 positions shown; findings below may reference images not displayed]

FINDINGS: Cardiomegaly status post median sternotomy. Both lungs are clear.
The visualized skeletal structures are unremarkable.
IMPRESSION: Cardiomegaly without acute abnormality of the lungs.

## 2020-09-05 NOTE — Telephone Encounter (Signed)
noted 

## 2020-09-15 ENCOUNTER — Other Ambulatory Visit

## 2020-09-15 ENCOUNTER — Encounter (HOSPITAL_BASED_OUTPATIENT_CLINIC_OR_DEPARTMENT_OTHER)

## 2020-09-15 ENCOUNTER — Encounter

## 2020-09-15 DIAGNOSIS — K0889 Other specified disorders of teeth and supporting structures: Secondary | ICD-10-CM

## 2020-09-15 MED ORDER — oxyCODONE-acetaminophen (Percocet) 5-325 mg per tablet 2 tablet
5-325 | Freq: Four times a day (QID) | ORAL | Status: DC | PRN
Start: 2020-09-15 — End: 2020-09-16
  Administered 2020-09-16: 03:00:00 2 via ORAL

## 2020-09-15 NOTE — Other (Signed)
Patient Education   Table of Contents       Dental Pain     To view videos and all your education online visit,   [email or web address]   or scan this QR code with your smartphone.                    Dental Pain         Dental pain is often a sign that something is wrong with your teeth or gums. It is also something that can occur following dental treatment. If you have dental pain, it is important to contact your dental care provider, especially if the cause of the pain has not been determined. Dental pain may be of varying intensity and can be caused by many things, including:       Tooth decay (cavities or caries). Cavities are caused by bacteria that produce acids that irritate the nerve of your tooth, making it sensitive to air and hot or cold temperatures. This eventually causes discomfort or pain.       Abscess or infection. Once the bacteria reach the inner part of the tooth (pulp), a bacterial infection (dental abscess) can occur. Pus typically collects at the end of the root of a tooth.       Injury.       A crack in the tooth.       Gum recession exposing the root, and possibly the nerves, of a tooth.       Gum (periodontal)disease.       Abnormal grinding or clenching.       Poor or improper home care.       An unknown reason (idiopathic).      Your pain may be mild or severe. It may occur when you are:       Chewing.       Exposed to hot or cold temperatures.       Eating or drinking sugary foods or beverages, such as soda or candy.     Your pain may be constant, or it may come and go without cause.     Follow these instructions at home:   The following actions may help to lessen any discomfort that you are feeling before or after getting dental care.   Medicines         Take over-the-counter and prescription medicines only as told by your dental care provider.       If you were prescribed an antibiotic medicine, take it as told by your dental care provider. Do not  stop taking the antibiotic  even if you start to feel better.     Eating and drinking    Avoid foods or drinks that cause you pain, such as:       Very hot or very cold foods or drinks.       Sweet or sugary foods or drinks.     Managing pain and swelling            Ice can sometimes be used to reduce pain and swelling, especially if the pain is following dental treatment.      If directed, put ice on the painful area of your face. To do this:       Put ice in a plastic bag.       Place a towel between your skin and the bag.       Leave the ice on for 20 minutes, 2?3 times a day.  Remove the ice if your skin turns bright red. This is very important. If you cannot feel pain, heat, or cold, you have a greater risk of damage to the area.     Brushing your teeth         To keep your mouth and gums healthy, brush your teeth twice a day using a fluoride toothpaste.       Use a toothpaste made for sensitive teeth as directed by your dental care provider, especially if the root is exposed.       Always brush your teeth with a soft-bristled toothbrush. This will help prevent irritation to your gums.     General instructions         Floss at least once a day.      Do not  apply heat to the outside of the face.       Gargle with a mixture of salt and water 3?4 times a day or as needed. To make salt water, completely dissolve ??1 tsp (3?6 g) of salt in 1 cup (237 mL) of warm water.       Keep all follow-up visits. This is important.       Contact a dental care provider if:         You have any unexplained dental pain.       Your pain is not controlled with medicines.       Your symptoms get worse.       You have new symptoms.     Get help right away if:         You are unable to open your mouth.       You are having trouble breathing or swallowing.       You have a fever.       You notice that your face, neck, or jaw is swollen.     These symptoms may represent a serious problem that is an emergency. Do not wait to see if the symptoms will go away.  Get medical help right away. Call your local emergency services (911 in the [place] Do not drive yourself to the hospital.   Summary         Dental pain may be caused by many things, including tooth decay and infection.       Your pain may be mild or severe.       Take over-the-counter and prescription medicines only as told by your dental care provider.       Watch your dental pain for any changes. Let your dental care provider know if your symptoms get worse.     This information is not intended to replace advice given to you by your health care provider. Make sure you discuss any questions you have with your health care provider.     Document Released: 12/18/2006Document Revised: 09/22/2021Document Reviewed: 02/10/2020     Elsevier Patient Education ? 2022 [employer] Inc[employer]

## 2020-09-15 NOTE — ED Triage Notes (Signed)
Patient states that he was eating a few days ago and a front bottom tooth broke.  Patient complaining of pain since then and has appointment at [facility] on Monday but was unable to take the pain in his mouth any longer.  Has been taking Tylenol PTA with no effect

## 2020-09-15 NOTE — ED Provider Notes (Signed)
EMERGENCY DEPARTMENT ENCOUNTER    ATTENDING NOTE    Date of service: 09/15/2020 11:01 PM    Chief Complaint   Patient presents with   ? Dental Pain       Nursing notes reviewed and agreed with unless modified below.    HISTORY OF PRESENT ILLNESS:    [name] is a 45 y.o. male who  has a past medical history of Heart attack (CMS/HCC) and Hypertension. who presents with lower dental pain starting several days ago after breaking his tooth.  He was seen here and [facility].  There he was told that he needs to teeth removed and has a scheduled appointment for this upcoming Monday.  However tonight he started developing increasing pain.  No fevers or swelling.  No difficulty swallowing.  Took Tylenol earlier today with only brief improvement.    REVIEW OF SYSTEMS    Constitutional: no fevers  Eyes: no acute vision change   ENT: no sore throat    PAST MEDICAL HISTORY / PROBLEM LIST    Past Medical History:   Diagnosis Date   ? Heart attack (CMS/HCC)     x3   ? Hypertension        There is no problem list on file for this patient.      PAST SURGICAL HISTORY    Past Surgical History:   Procedure Laterality Date   ? ARTERIAL BYPASS SURGERY      x2       MEDICATIONS     Previous Medications    AMOXICILLIN (AMOXIL) 500 MG CAPSULE    TAKE 1 CAPSULE BY MOUTH EVERY 8 HOURS FOR 5 DAYS    ASPIRIN 81 MG CAPSULE    Take 81 mg by mouth in the morning.    ATORVASTATIN (LIPITOR) 10 MG TABLET    Take 40 mg by mouth in the morning.    METOPROLOL TARTRATE (LOPRESSOR) 25 MG TABLET    Take 25 mg by mouth in the morning and 25 mg in the evening.        ALLERGIES    Allergies   Allergen Reactions   ? Ibuprofen Anaphylaxis, Hives, Itching and Rash     Other reaction(s): Anaphylactic reaction, Unknown, Urticaria  anaphylaxis  anaphylaxis     ? Ketorolac Anaphylaxis, Hives and Rash     Other reaction(s): ITCHY HIVES, Unknown, Urticaria  Converted from Generic Allergy: Ketorolac  Anaphylaxis   Anaphylaxis   Tolerates ASA     ?  Tramadol Anaphylaxis, Angioedema, Hives and Rash     Other reaction(s): THROAT CLOSES, Unknown, Urticaria  Patient confirmed that he can take Percocet, Hydromorphone and Morphine. [name], Pharmacy  anaphylaxis  anaphylaxis         SOCIAL HISTORY    Social History     Tobacco Use   ? Smoking status: Current Every Day Smoker     Types: Cigarettes   ? Smokeless tobacco: Never Used   Substance Use Topics   ? Alcohol use: Never     Social History     Substance and Sexual Activity   Drug Use Never       FAMILY HISTORY    No family history on file.    PHYSICAL EXAM    ED Triage Vitals [09/15/20 2257]   Temp Pulse Resp BP   37.3 ?C (99.1 ?F) 82 18 (!) 144/86      SpO2 Temp src Heart Rate Source Patient Position   98 % --  Monitor Sitting      BP Location FiO2 (%)     Left arm --          General: Mild distress  Head: Atraumatic  Eyes: Anicteric sclera   ENT: Moist oral mucosa.  Missing all of his upper teeth in his lower dentition is in very poor repair.  He has severe gingival recession with discoloration of his teeth.  Tooth #24 is fractured.  He has mild tenderness along the gingiva.  No palpable fluid collection or swelling.  Floor the mouth is soft.  Neck is supple with no adenopathy.  Neuro: Alert and oriented x3, no focal deficits    No orders to display       MEDICAL DECISION MAKING & ED COURSE    Pertinent labs and imaging studies reviewed.    Diagnoses as of 09/15/20 2314   Pain, dental       Discussed pain control with the patient at length.  He states he is allergic to NSAIDs and tramadol but has tolerated Vicodin and Percocet in the past.  He states he is driving and would prefer to take the medication at home so he can sleep.  He agrees to follow-up in the dental school emergency clinic first thing tomorrow morning and will arrive early around 8:15 in the morning so that he can be going to first seen.  Return precautions are given.    CLINICAL IMPRESSION:    1. Pain, dental             Milas Hock,  MD  09/15/20 2317

## 2020-09-16 ENCOUNTER — Inpatient Hospital Stay
Admit: 2020-09-16 | Discharge: 2020-09-16 | Disposition: A | Discharge status: Home / Self Care | Payer: MEDICAID | Attending: Contractor

## 2020-09-16 MED FILL — OXYCODONE-ACETAMINOPHEN 5 MG-325 MG TABLET: 5-325 5-325 mg | ORAL | Qty: 2

## 2020-10-25 ENCOUNTER — Encounter (EMERGENCY_DEPARTMENT_HOSPITAL): Admitting: Cardiovascular Disease

## 2020-10-26 LAB — CMP (EXT)
ALT/SGPT (EXT): 94 U/L — ABNORMAL HIGH (ref 10–40)
AST/SGOT (EXT): 72 U/L — ABNORMAL HIGH (ref 10–40)
Albumin (EXT): 3.7 g/dL (ref 3.5–4.8)
Alkaline Phosphatase (EXT): 61 U/L (ref 30–115)
Anion Gap (EXT): 8 (ref 5–15)
BUN (EXT): 12 mg/dL (ref 7–23)
Bilirubin, Total (EXT): 0.5 mg/dL (ref 0.3–1.2)
CO2 (EXT): 28 mmol/L (ref 24–32)
CalciumCalcium (EXT): 8.8 mg/dL (ref 8.7–10.7)
Chloride (EXT): 97 mmol/L (ref 97–110)
Creatinine (EXT): 1.06 mg/dL (ref 0.60–1.30)
Glucose (EXT): 515 mg/dL — CR (ref 70–99)
Potassium (EXT): 4 mmol/L (ref 3.5–5.3)
Protein (EXT): 7.3 g/dL (ref 6.0–8.0)
Sodium (EXT): 133 mmol/L — ABNORMAL LOW (ref 135–145)
eGFR - Creat CKD-EPI (EXT): 90 mL/min/{1.73_m2} (ref >=90)

## 2020-10-26 LAB — HEP C AB WITH REFLEX QUANT (EXT)
HEP C Ab. S/CO RATIO (EXT): >11 — ABNORMAL HIGH (ref <1.00)
HEPATITIS C ANTIBODY (EXT): REACTIVE — AB

## 2020-10-26 LAB — HEPATITIS C ANTIBODY CARE EVERYWHERE
HEPATITIS C ANTIBODY CARE EVERYWHERE: REACTIVE — AB
SIGNAL TO CUT OFF CARE EVERYWHERE: >11 — ABNORMAL HIGH (ref <1.00)

## 2020-10-26 LAB — COVID-19 CARE EVERYWHERE: COVID-19 CARE EVERYWHERE: NOT DETECTED

## 2020-10-29 LAB — LAB EXTERNAL RESULT UNMAPPED
HEPATITIS C RNA PCR CARE EVERYWHERE: 439000 [IU]/mL — ABNORMAL HIGH
HEPATITIS C RNA PCR CARE EVERYWHERE: 5.64 {Log} — ABNORMAL HIGH

## 2020-10-30 LAB — BMP (EXT)
Anion Gap (EXT): 3 — ABNORMAL LOW (ref 5–15)
BUN (EXT): 9 mg/dL (ref 7–18)
CO2 (EXT): 30 mmol/L (ref 22–30)
CalciumCalcium (EXT): 9 mg/dL (ref 8.5–10.1)
Chloride (EXT): 107 mmol/L (ref 98–107)
Creatinine (EXT): 0.92 mg/dL (ref 0.60–1.30)
Glucose (EXT): 254 mg/dL — ABNORMAL HIGH (ref 70–99)
Potassium (EXT): 3.5 mmol/L (ref 3.5–5.3)
Sodium (EXT): 140 mmol/L (ref 136–145)
eGFR - Creat CKD-EPI (EXT): >90 mL/min/{1.73_m2} (ref >=90)

## 2020-11-07 LAB — BMP (EXT)
Anion Gap (EXT): 11 mmol/L (ref 3–17)
BUN (EXT): 11 mg/dL (ref 6–20)
CO2 (EXT): 23 mmol/L (ref 22–32)
CalciumCalcium (EXT): 9.7 mg/dL (ref 8.9–10.3)
Chloride (EXT): 98 mmol/L (ref 98–107)
Creatinine (EXT): 0.92 mg/dL (ref 0.6–1.3)
GFR Estimated (Calc) (EXT): 106 mL/min/{1.73_m2} (ref 60–128)
Glucose (EXT): 506 mg/dL — CR (ref 65–99)
Potassium (EXT): 4.3 mmol/L (ref 3.6–5.1)
Sodium (EXT): 132 mmol/L — ABNORMAL LOW (ref 136–145)

## 2020-11-16 ENCOUNTER — Emergency Department: Admit: 2020-11-16 | Payer: MEDICAID | Primary: Family

## 2020-11-16 ENCOUNTER — Encounter (HOSPITAL_BASED_OUTPATIENT_CLINIC_OR_DEPARTMENT_OTHER)

## 2020-11-16 ENCOUNTER — Encounter

## 2020-11-16 ENCOUNTER — Other Ambulatory Visit

## 2020-11-16 ENCOUNTER — Inpatient Hospital Stay
Admit: 2020-11-16 | Discharge: 2020-11-17 | Disposition: A | Discharge status: Home / Self Care | Payer: MEDICAID | Attending: Emergency Medicine

## 2020-11-16 DIAGNOSIS — K0889 Other specified disorders of teeth and supporting structures: Secondary | ICD-10-CM

## 2020-11-16 LAB — BMP (EXT)
Anion Gap (EXT): 9 (ref 5–10)
BUN (EXT): 9 mg/dL (ref 6–20)
CO2 (EXT): 26 mmol/L (ref 21–31)
CalciumCalcium (EXT): 9.2 mg/dL (ref 8.6–10.3)
Chloride (EXT): 103 mmol/L (ref 98–107)
Creatinine (EXT): 1.02 mg/dL (ref 0.80–1.30)
Glucose (EXT): 301 mg/dL — ABNORMAL HIGH (ref 70–99)
Potassium (EXT): 3.5 mmol/L (ref 3.5–5.1)
Sodium (EXT): 138 mmol/L (ref 136–145)

## 2020-11-16 LAB — PROTHROMBIN TIME CARE EVERYWHERE
INR CARE EVERYWHERE: 1.2
PROTHROMBIN TIME CARE EVERYWHERE: 13.4 s — ABNORMAL HIGH (ref 9.4–12.5)

## 2020-11-16 LAB — ESTIMATED GLOMERULAR FILTRATION RATE CARE EVERYWHERE: ESTIMATED GFR CARE EVERYWHERE: >60 mL/min/{1.73_m2} (ref >=60)

## 2020-11-16 MED ORDER — oxyCODONE-acetaminophen (Percocet) 5-325 mg per tablet 1 tablet
5-325 | Freq: Once | ORAL | Status: AC
Start: 2020-11-16 — End: 2020-11-16
  Administered 2020-11-17: 1 via ORAL

## 2020-11-16 MED ORDER — oxyCODONE-acetaminophen (Percocet) 5-325 mg per tablet 1 tablet
5-325 | Freq: Once | ORAL | Status: DC
Start: 2020-11-16 — End: 2020-11-16

## 2020-11-16 MED ORDER — oxyCODONE-acetaminophen (Percocet) 5-325 mg per tablet 1 tablet
5-325 | Freq: Four times a day (QID) | ORAL | Status: DC | PRN
Start: 2020-11-16 — End: 2020-11-16
  Administered 2020-11-16: 1 via ORAL

## 2020-11-16 MED ORDER — oxyCODONE-acetaminophen (Percocet) 5-325 mg per tablet 1 tablet
5-325 | Freq: Four times a day (QID) | ORAL | Status: DC | PRN
Start: 2020-11-16 — End: 2020-11-16

## 2020-11-16 MED FILL — OXYCODONE-ACETAMINOPHEN 5 MG-325 MG TABLET: 5-325 5-325 mg | ORAL | Qty: 1

## 2020-11-16 NOTE — ED Provider Notes (Addendum)
 HPI   Chief Complaint   Patient presents with   ? Dental Pain   ? Knee Pain       HPI    Has left knee pain, constant, anterior primarily, possible trauma, associated with swelling, denies numbness or weakness, also complaining of dental pain at the anterior aspect of his lower jaw               Glasgow Coma Scale Score: 15                                  Patient History   Past Medical History:   Diagnosis Date   ? Heart attack (CMS/HCC)     x3   ? Hypertension     pt denies having high BP     Past Surgical History:   Procedure Laterality Date   ? ARTERIAL BYPASS SURGERY      x2     No family history on file.  Social History     Tobacco Use   ? Smoking status: Current Every Day Smoker     Types: Cigarettes   ? Smokeless tobacco: Never Used   Vaping Use   ? Vaping Use: Not on file   Substance Use Topics   ? Alcohol use: Never   ? Drug use: Never       Review of Systems   Review of Systems    + Dental pain, left knee pain    All other systems reviewed and are negative.    Nursing notes reviewed and I agree.     This patient was evaluated during a time of global shortage of iodinated contrast media. Based on guidance from the [employer] of [employer], best practices, and local institutional approaches, an alternative path for evaluating and managing the patient may have been employed in order to provide optimal care during this shortage. If applicable, the current situation has been discussed with the patient.      Physical Exam   ED Triage Vitals [11/16/20 1801]   Temp Pulse Resp BP   (!) 35.9 ?C (96.7 ?F) -- 18 102/66      SpO2 Temp Source Heart Rate Source Patient Position   97 % Temporal -- Sitting      BP Location FiO2 (%)     Left arm --       Physical Exam  No orders to display   - General  General appearance: alert, in no apparent distress  - Eye  Eye exam: Absent: scleral icterus  - ENT  ENT exam: normal oropharynx, has tooth #24 with severe cavitation and fracture but no evidence of abscess or even  infection or erythema surrounding the site on the gums, slightly tender to percussion  - Respiratory  Respiratory exam: Present: normal lung sounds bilaterally  - Cardiovascular  Cardiovascular exam: Present: regular rate, normal rhythm   - Abdominal Exam  Abdominal exam: Present: soft.  Absent: tenderness  - Neurological Exam  Neurological exam: Present: alert, no facial droop, moving all fours spontaneously  - Psychiatric  Psychiatric exam: Present: normal affect, normal mood  - Skin  Skin exam: Present: warm, dry  left lower extremity exam: 2+ DP PT Pop pulses, full sensation throughout, positive tenderness with ROM at the knee, ROM intact, TTP at knee. Full 5/5 strength for dorsi and plantar flexion. No wounds appreciated. No cellulitis or abscess palpated or seen. Knee exam:  Overall, the knee is stable. There is crepitus. There no warmth. There is swelling. Lachman negative. McMurray's test positive. MCL and LCL without severe laxity.        Labs Reviewed - No data to display    ED Course & MDM   Diagnoses as of 11/16/20 2345   Pain, dental   Left knee pain, unspecified chronicity       MDM    45 year old male with a history of MI, hypertension, presents with dental pain and left knee pain after possible trauma vs. Walking alone.  Patient is primarily here for left knee pain.  His dental pain is ongoing and he has a plan to have an extraction at [facility] dental soon.    Initial differential diagnosis: knee sprain.  Knee osteoarthritis.  Fracture or dislocation less likely based on exam and no trauma.  Doubt septic arthritis.    Workup planned: X-ray left knee    Treatments planned: pain control, given 1 Percocet, states he has tolerated this before and has no evidence of anaphylaxis to Percocet    I have examined the patient's current results:   Imaging: Images visualized and interpreted by me include x-ray of the left knee: no acute findings. There is a small-mod effusion.     My working diagnosis: left knee  sprain    Final plan and consults: RICE, pain control, supportive care, follow up outpatient with PCP and possibly orthopedics if no improvement.     Patient agreeable to plan.    Patient received both written and formal discharge instructions. Diagnosis and treatment discussed.    Of note, patient was given percocet once on arrival, and then requested a dose to take home.      Darene Lamer, MD  11/16/20 2348       Darene Lamer, MD  11/16/20 [phone]

## 2020-11-16 NOTE — ED Triage Notes (Addendum)
 Pt presents to the ED for evaluation for worsening dental pain. Pt reports he has an appointment July 27th to have the tooth pulled. Pt also report left knee swelling that is worse today. Previous injury of torn mensicus to the left knee. Denies fevers. Reports taking tylenol PTA with not relief.

## 2020-11-16 NOTE — Discharge Instructions (Signed)
 You were seen in the [facility] emergency department today for left knee pain and dental pain. You should follow up with orthopedics (referral has been placed) and see the primary care doctor. If you develop severe pain, or any other worrisome symptoms, please return directly to [facility] Emergency Department or any nearby Emergency Department.

## 2020-11-16 NOTE — Other (Signed)
 Patient Education   Table of Contents       Acute Knee Pain, Adult     To view videos and all your education online visit,   [email or web address]   or scan this QR code with your smartphone.                    Acute Knee Pain, Adult     Acute knee pain is sudden and may be caused by damage, swelling, or irritation of the muscles and tissues that support the knee. Pain may result from:       A fall.       An injury to the knee from twisting motions.       A hit to the knee.       Infection.      Acute knee pain may go away on its own with time and rest. If it does not, your health care provider may order tests to find the cause of the pain. These may include:       Imaging tests, such as an X-ray, MRI, CT scan, or ultrasound.       Joint aspiration. In this test, fluid is removed from the knee and evaluated.       Arthroscopy. In this test, a lighted tube is inserted into the knee and an image is projected onto a TV screen.       Biopsy. In this test, a sample of tissue is removed from the body and studied under a microscope.     Follow these instructions at home:   If you have a knee sleeve or brace:            Wear the knee sleeve or brace as told by your health care provider. Remove it only as told by your health care provider.       Loosen it if your toes tingle, become numb, or turn cold and blue.       Keep it clean.      If the knee sleeve or brace is not waterproof:      Do not  let it get wet.       Cover it with a watertight covering when you take a bath or shower.     Activity         Rest your knee.      Do not  do things that cause pain or make pain worse.       Avoid high-impact activities or exercises, such as running, jumping rope, or doing jumping jacks.       Work with a physical therapist to make a safe exercise program, as recommended by your health care provider. Do exercises as told by your physical therapist.     Managing pain, stiffness, and swelling           If directed, put ice  on the affected knee. To do this:       If you have a removable knee sleeve or brace, remove it as told by your health care provider.       Put ice in a plastic bag.       Place a towel between your skin and the bag.       Leave the ice on for 20 minutes, 2?3 times a day.       Remove the ice if your skin turns bright red. This is very important. If you cannot feel pain, heat, or cold,  you have a greater risk of damage to the area.       If directed, use an elastic bandage to put pressure (compression) on your injured knee. This may control swelling, give support, and help with discomfort.       Raise (elevate) your knee above the level of your heart while you are sitting or lying down.       Sleep with a pillow under your knee.       General instructions         Take over-the-counter and prescription medicines only as told by your health care provider.      Do not  use any products that contain nicotine or tobacco, such as cigarettes, e-cigarettes, and chewing tobacco. If you need help quitting, ask your health care provider.       If you are overweight, work with your health care provider and a dietitian to set a weight-loss goal that is healthy and reasonable for you. Extra weight can put pressure on your knee.       Pay attention to any changes in your symptoms.       Keep all follow-up visits. This is important.       Contact a health care provider if:         Your knee pain continues, changes, or gets worse.       You have a fever along with knee pain.       Your knee feels warm to the touch or is red.       Your knee buckles or locks up.     Get help right away if:         Your knee swells, and the swelling becomes worse.       You cannot move your knee.       You have severe pain in your knee that cannot be managed with pain medicine.     Summary         Acute knee pain can be caused by a fall, an injury, an infection, or damage, swelling, or irritation of the tissues that support your knee.       Your  health care provider may perform tests to find out the cause of the pain.       Pay attention to any changes in your symptoms. Relieve your pain with rest, medicines, light activity, and the use of ice.       Get help right away if your knee swells, you cannot move your knee, or you have severe pain that cannot be managed with medicine.     This information is not intended to replace advice given to you by your health care provider. Make sure you discuss any questions you have with your health care provider.     Document Released: 10/14/2008Document Revised: 06/02/2021Document Reviewed: 10/21/2019     Elsevier Patient Education ? 2022 Else[employer] Inc[employer]

## 2020-11-16 NOTE — Other (Signed)
 Patient Education   Table of Contents       RICE Therapy for Routine Care of Injuries     To view videos and all your education online visit,   [email or web address]   or scan this QR code with your smartphone.                    RICE Therapy for Routine Care of Injuries     The routine care of many injuries includes rest, ice, compression, and elevation (RICE therapy). RICE therapy is often recommended for injuries to soft tissues, such as muscle strain, sprains, bruises, and overuse injuries. It can also be used for some bone injuries. Using RICE therapy can help to relieve pain and lessen swelling.   Supplies needed:         Ice.       Plastic bag.       Towel.       Elastic bandage.       Pillow or pillows to raise (elevate) the injured body part.     How to care for your injury with RICE therapy      Rest   Rest your injury. This may help with the healing process. Rest usually involves limiting your normal activities and not using the injured part of your body. Generally, you can return to your normal activities when your health care provider says it is okay and you can do them without much discomfort.   If you rest the injury too much, it may not heal as well. Some injuries heal better with early movement instead of resting for too long. Talk with your health care provider about how you should limit your activities and whether you should start range-of-motion exercises for your injury.   Ice    Ice your injury to lessen swelling and pain. Do not  apply ice directly to your skin.       Put ice in a plastic bag.       Place a towel between your skin and the bag.       Leave the ice on for 20 minutes, 2?3 times a day. Use ice on as many days as told by your health care provider.       Compression       Put pressure (compression) on your injured area to control swelling, give support, and help with discomfort. Compression may be done with an elastic bandage. If an elastic bandage has been applied, follow  these general tips:       Use the bandage as directed by the maker of the bandage that you are using.      Do not  wrap the bandage too tightly. That may block (cut off) circulation in the arm or leg in the area below the bandage.       If part of your body beyond the bandage becomes blue, numb, cold, swollen, or more painful, your bandage is probably too tight. If this occurs, remove your bandage and reapply it more loosely.       Remove and reapply the bandage every 3?4 hours or as told by your health care provider.       See your health care provider if the bandage seems to be making your problems worse rather than better.       Elevation   Elevate your injured area to lessen swelling and pain. If possible, elevate your injured area at or above the level of your  heart or the center of your chest.   Contact a health care provider if:         Your pain and swelling continue.       Your symptoms are getting worse rather than improving.     Having these problems may mean that you need further evaluation or imaging tests, such as X-rays or an MRI. Sometimes, X-rays may not show a small broken bone (fracture) until days after the injury happened. Make a follow-up appointment with your health care provider. Ask your health care provider, or the department that is doing the imaging test, when your results will be ready.   Get help right away if:         You have sudden severe pain at or below the area of your injury.       You have redness or increased swelling around your injury.       You have tingling or numbness at or below the area of your injury and it does not improve after you remove the elastic bandage.     Summary         The routine care of many injuries includes rest, ice, compression, and elevation (RICE therapy). Using RICE therapy can help to relieve pain and lessen swelling.       RICE therapy is often recommended for injuries to soft tissues, such as muscle strain, sprains, bruises, and overuse injuries.  It can also be used for some bone injuries.       Seek medical care if your pain and swelling continue or if your symptoms are getting worse rather than improving.     This information is not intended to replace advice given to you by your health care provider. Make sure you discuss any questions you have with your health care provider.     Document Released: 04/01/2002Document Revised: 02/22/2021Document Reviewed: 01/25/2017     Elsevier Patient Education ? 2021 Else[employer] Inc.

## 2020-11-16 NOTE — Other (Signed)
 Patient Education   Table of Contents       Dental Pain     To view videos and all your education online visit,   [email or web address]/[email or web address]   or scan this QR code with your smartphone.                    Dental Pain         Dental pain is often a sign that something is wrong with your teeth or gums. It is also something that can occur following dental treatment. If you have dental pain, it is important to contact your dental care provider, especially if the cause of the pain has not been determined. Dental pain may be of varying intensity and can be caused by many things, including:       Tooth decay (cavities or caries). Cavities are caused by bacteria that produce acids that irritate the nerve of your tooth, making it sensitive to air and hot or cold temperatures. This eventually causes discomfort or pain.       Abscess or infection. Once the bacteria reach the inner part of the tooth (pulp), a bacterial infection (dental abscess) can occur. Pus typically collects at the end of the root of a tooth.       Injury.       A crack in the tooth.       Gum recession exposing the root, and possibly the nerves, of a tooth.       Gum (periodontal)disease.       Abnormal grinding or clenching.       Poor or improper home care.       An unknown reason (idiopathic).      Your pain may be mild or severe. It may occur when you are:       Chewing.       Exposed to hot or cold temperatures.       Eating or drinking sugary foods or beverages, such as soda or candy.     Your pain may be constant, or it may come and go without cause.     Follow these instructions at home:   The following actions may help to lessen any discomfort that you are feeling before or after getting dental care.   Medicines         Take over-the-counter and prescription medicines only as told by your dental care provider.       If you were prescribed an antibiotic medicine, take it as told by your dental care provider. Do not  stop taking the antibiotic  even if you start to feel better.     Eating and drinking    Avoid foods or drinks that cause you pain, such as:       Very hot or very cold foods or drinks.       Sweet or sugary foods or drinks.     Managing pain and swelling            Ice can sometimes be used to reduce pain and swelling, especially if the pain is following dental treatment.      If directed, put ice on the painful area of your face. To do this:       Put ice in a plastic bag.       Place a towel between your skin and the bag.       Leave the ice on for 20 minutes, 2?3 times a day.  Remove the ice if your skin turns bright red. This is very important. If you cannot feel pain, heat, or cold, you have a greater risk of damage to the area.     Brushing your teeth         To keep your mouth and gums healthy, brush your teeth twice a day using a fluoride toothpaste.       Use a toothpaste made for sensitive teeth as directed by your dental care provider, especially if the root is exposed.       Always brush your teeth with a soft-bristled toothbrush. This will help prevent irritation to your gums.     General instructions         Floss at least once a day.      Do not  apply heat to the outside of the face.       Gargle with a mixture of salt and water 3?4 times a day or as needed. To make salt water, completely dissolve ??1 tsp (3?6 g) of salt in 1 cup (237 mL) of warm water.       Keep all follow-up visits. This is important.       Contact a dental care provider if:         You have any unexplained dental pain.       Your pain is not controlled with medicines.       Your symptoms get worse.       You have new symptoms.     Get help right away if:         You are unable to open your mouth.       You are having trouble breathing or swallowing.       You have a fever.       You notice that your face, neck, or jaw is swollen.     These symptoms may represent a serious problem that is an emergency. Do not wait to see if the symptoms will go away.  Get medical help right away. Call your local emergency services (911 in the [place] Do not drive yourself to the hospital.   Summary         Dental pain may be caused by many things, including tooth decay and infection.       Your pain may be mild or severe.       Take over-the-counter and prescription medicines only as told by your dental care provider.       Watch your dental pain for any changes. Let your dental care provider know if your symptoms get worse.     This information is not intended to replace advice given to you by your health care provider. Make sure you discuss any questions you have with your health care provider.     Document Released: 12/18/2006Document Revised: 09/22/2021Document Reviewed: 02/10/2020     Elsevier Patient Education ? 2022 Elsevier Inc.

## 2020-11-16 NOTE — ED Progress Note (Signed)
 Provider in Triage Note:    45 yo M with PMHx CAD presents to ED as walk in for evaluation of 1 day of dental pain, and left knee pain. Dental pain localized to the "lower front" tooth. Pt reports he has an appointment to get his tooth pulled on July 27th , however pain recurred today. He also reports chronic left knee pain 2/2 "torn meniscus" however reports swelling in his left knee today. No new trauma reported.    EDIE Data reviewed: 50 ED visits at 85 different emergency departments in the past 12 months for various complaints.      Jeronimo Norma, PA  11/16/20 1759       Jeronimo Norma, PA  11/16/20 1800

## 2020-11-17 MED FILL — OXYCODONE-ACETAMINOPHEN 5 MG-325 MG TABLET: 5-325 5-325 mg | ORAL | Qty: 1

## 2020-11-19 ENCOUNTER — Encounter (HOSPITAL_BASED_OUTPATIENT_CLINIC_OR_DEPARTMENT_OTHER): Payer: Self-pay

## 2020-11-19 ENCOUNTER — Emergency Department (HOSPITAL_BASED_OUTPATIENT_CLINIC_OR_DEPARTMENT_OTHER): Payer: No Typology Code available for payment source

## 2020-11-19 ENCOUNTER — Emergency Department
Admission: EM | Admit: 2020-11-19 | Discharge: 2020-11-19 | Discharge status: Against Medical Advice | Payer: No Typology Code available for payment source | Attending: Emergency Medicine | Admitting: Emergency Medicine

## 2020-11-19 DIAGNOSIS — R079 Chest pain, unspecified: Secondary | ICD-10-CM | POA: Insufficient documentation

## 2020-11-19 LAB — BMP (EXT)
Anion Gap (EXT): 11 mmol/L (ref 10–22)
BUN (EXT): 10 mg/dL (ref 7–18)
CO2 (EXT): 27 mmol/L (ref 21–32)
CalciumCalcium (EXT): 9.5 mg/dL (ref 8.5–10.1)
Chloride (EXT): 102 mmol/L (ref 98–107)
Creatinine (EXT): 1.1 mg/dL (ref 0.7–1.2)
Glucose (EXT): 287 mg/dL — ABNORMAL HIGH (ref 74–160)
Potassium (EXT): 4.1 mmol/L (ref 3.5–5.1)
Sodium (EXT): 140 mmol/L (ref 136–145)
eGFR - Creat CKD-EPI (EXT): >60 mL/min (ref >60)

## 2020-11-19 LAB — RESPIRATORY PANEL BASIC INPAT
INFLUENZA A: NEGATIVE
INFLUENZA B: NEGATIVE
RESPIRATORY SYNCYTIAL VIRUS: NEGATIVE
SARS-COV-2: NEGATIVE

## 2020-11-19 LAB — BASIC METABOLIC PANEL
ANION GAP: 11 mmol/L (ref 10–22)
BUN (UREA NITROGEN): 10 mg/dL (ref 7–18)
CALCIUM: 9.5 mg/dl (ref 8.5–10.1)
CARBON DIOXIDE: 27 mmol/L (ref 21–32)
CHLORIDE: 102 mmol/L (ref 98–107)
CREATININE: 1.1 mg/dL (ref 0.7–1.2)
ESTIMATED GLOMERULAR FILT RATE: >60 mL/min (ref >60)
Glucose Random: 287 mg/dL — ABNORMAL HIGH (ref 74–160)
POTASSIUM: 4.1 mmol/L (ref 3.5–5.1)
SODIUM: 140 mmol/L (ref 136–145)

## 2020-11-19 LAB — CBC, PLATELET & DIFFERENTIAL
ABSOLUTE BASO COUNT: 0 10*3/uL (ref 0.0–0.1)
ABSOLUTE EOSINOPHIL COUNT: 0.1 10*3/uL (ref 0.0–0.8)
ABSOLUTE IMM GRAN COUNT: 0.02 10*3/uL (ref 0.00–0.03)
ABSOLUTE LYMPH COUNT: 1.8 10*3/uL (ref 0.6–5.9)
ABSOLUTE MONO COUNT: 0.3 10*3/uL (ref 0.2–1.4)
ABSOLUTE NEUTROPHIL COUNT: 3.2 10*3/uL (ref 1.6–8.3)
ABSOLUTE NRBC COUNT: 0 10*3/uL (ref 0.0–0.0)
BASOPHIL %: 0.4 % (ref 0.0–1.2)
EOSINOPHIL %: 1.8 % (ref 0.0–7.0)
HEMATOCRIT: 38.2 % — ABNORMAL LOW (ref 40.1–51.0)
HEMOGLOBIN: 12.6 g/dL — ABNORMAL LOW (ref 13.7–17.5)
IMMATURE GRANULOCYTE %: 0.4 % (ref 0.0–0.4)
LYMPHOCYTE %: 33.3 % (ref 15.0–54.0)
MEAN CORP HGB CONC: 33 g/dL (ref 31.0–37.0)
MEAN CORPUSCULAR HGB: 30.2 pg (ref 26.0–34.0)
MEAN CORPUSCULAR VOL: 91.6 fl (ref 80.0–100.0)
MEAN PLATELET VOLUME: 11.3 fL (ref 8.7–12.5)
MONOCYTE %: 5.3 % (ref 4.0–13.0)
NEUTROPHIL %: 58.8 % (ref 40.0–75.0)
NRBC %: 0 % (ref 0.0–0.0)
PLATELET COUNT: 221 10*3/uL (ref 150–400)
RBC DISTRIBUTION WIDTH STD DEV: 42 fL (ref 35.1–46.3)
RED BLOOD CELL COUNT: 4.17 M/uL — ABNORMAL LOW (ref 4.60–6.10)
WHITE BLOOD CELL COUNT: 5.5 10*3/uL (ref 4.0–11.0)

## 2020-11-19 LAB — TROPONIN T HS BASELINE: TROPONIN T HS BASELINE: 8 ng/L (ref 0–15)

## 2020-11-19 LAB — TROPONIN T HS 1 HOUR
DELTA 1 HOUR TROPONIN T HS: 0 ng/L (ref 0–4)
TROPONIN T HS 1 HOUR RESULT: 8 ng/L (ref 0–15)

## 2020-11-19 LAB — NT-PROBNP: NT-proBNP: 228 pg/mL — ABNORMAL HIGH (ref 0–125)

## 2020-11-19 MED ORDER — ONDANSETRON HCL 4 MG/2ML IJ SOLN
4.0000 mg | Freq: Once | INTRAMUSCULAR | Status: AC
Start: 2020-11-19 — End: 2020-11-19
  Administered 2020-11-19: 4 mg via INTRAVENOUS
  Filled 2020-11-19: qty 2

## 2020-11-19 MED ORDER — ACETAMINOPHEN 500 MG PO TABS
1000.0000 mg | ORAL_TABLET | Freq: Once | ORAL | Status: DC
Start: 2020-11-19 — End: 2020-11-19
  Filled 2020-11-19: qty 2

## 2020-11-19 MED ORDER — OXYCODONE HCL 5 MG PO TABS
5.0000 mg | ORAL_TABLET | Freq: Once | ORAL | Status: DC
Start: 2020-11-19 — End: 2020-11-19

## 2020-11-19 MED ORDER — ALUMINUM & MAGNESIUM HYDROXIDE 200-200 MG/5ML PO SUSP
30.0000 mL | Freq: Once | ORAL | Status: DC
Start: 2020-11-19 — End: 2020-11-19

## 2020-11-19 NOTE — Narrator Note (Addendum)
Patient refused maalox, after offering maalox and having the patient refuse the patient stated that he was leaving since the doctor is unwilling to treat his pain.  Provider aware,  Patient IV access removed and patient left the ED.

## 2020-11-19 NOTE — ED Notes (Signed)
EKG performed by this PAR without incident.  PT demographics confirmed. EKG handed off to MD on shift for sign off.

## 2020-11-19 NOTE — ED Triage Note (Signed)
Patient comes to the ED by EMS complains of chest pain that started 20 minutes prior to arrival while at rest.  Patient states that the pain has been getting worse.

## 2020-11-19 NOTE — ED Provider Notes (Signed)
ED Attending Note    ED Course & Medical Decision Making:  ED Course as of 11/19/20 1836   Sat Nov 19, 2020   1835 Labs reviewed after pt departure; there were no emergent findings, 0 and 1 hour trop negative.   1823 Pt again requesting morphine for pain. Again reviewed will be using non-opioid pain medications due to concerns above. Pt states he will go to another hospital, refused to sign AMA form. Pt has clear decisional capacity, appears well.   1749 Available records reviewed - pt with approx 80 ED visits in the past year for various causes, 2 ED visits at different hospitals on 6/29 most recently, one for CP (left AMA from [facility] after their cardiology recommended trial of renaxa for pain instead of opioids) and another for dental and knee pain. PDMP review also concerning - 23 controlled substance prescriptions, all from different providers. Allergy hx from pt also inconsistent with records - pt initially offered oxycodone here due to allergies listed to morphine, toradol, tramadol, motrin. Pt states, however, that he is allergic to oxycodone but that he can take morphine. However, per PDMP, pt has filled numerous oxycodone prescriptions in the past year. Given the above, will evaluate pt's chest pain but use non-opioid pain control.   1740 EKG : Initial  NSR@73 , no acute ST changes     Pt with hx CAD s/p two bypass surgeries c/o chest pain, concerning for ACS. EKG fortunately showed no ischemic changes, VSS, pt appearing comfortable. Reviewed with pt plan for CXR, labs, meds, likely admit given history of CAD. Pt expressed agreement. See above for remainder of pt's hospital course.     Disposition: Against Medical Advice Clinical Impression:  Nonspecific chest pain  (primary encounter diagnosis)    ED Prescriptions:  Current Discharge Medication List          HPI:  This 45 year old male patient presented to [facility] with chief complaint of Chest Pain  .  Pt states hx MI and  heart bypass surgery x2, latest in 2012, Pt states pain is substernal, pressure-like, radiating to the left arm and started approx 30 minutes prior to arrival. Pt states was previously in [facility] with no recent health concerns otherwise. Pt requesting medication for pain and nausea, pt states was given full dose aspirin by EMS.       Triage Documentation     [name], RN 11/19/2020 16:43             Patient comes to the ED by EMS complains of chest pain that started 20 minutes prior to arrival while at rest.  Patient states that the pain has been getting worse.         ED Triage Vitals [11/19/20 1640]   ED Triage Vitals Brief Group      Temp 98.8 F      Pulse 75      Resp 18      BP 150/86      SpO2 98 %      Pain Score 9     Physical Exam:  Gen: Well appearing, nad   Skin: Warm & dry, no bruising   Head: Atraumatic, anicteric sclera   ENT: Oropharynx clear, no swelling   Resp: Good air entry, CTA B/L   CV: RRR, no murmurs   Abd: Soft, non-distended, non-tender   Msk: No deformities, well-perfused, no edema   Neur: Normal speech, alert & conversant  Psyc: Normal affect, cooperative     Review of Systems:  Con: No fevers, no chills   Resp: No SOB, no wheeze   CV: + chest pain, no palpitations   GI: No vomiting, no diarrhea   Skin: No rash, no pruritis     Pertinent positives were reviewed as per the HPI above. All other systems were reviewed and are negative.     Radiology Reads:  XR Chest Portable   ED Interpretation    nad        ED Medications:  Medications   oxyCODONE (ROXICODONE) IR tablet 5 mg (5 mg Oral Not Given 11/19/20 1759)   acetaminophen (TYLENOL) tablet 1,000 mg (1,000 mg Oral Not Given 11/19/20 1758)   aluminum-magnesium hydroxide (MAALOX) 200-200 mg/5 mL suspension 30 mL (30 mLs Oral Not Given 11/19/20 1816)   ondansetron (ZOFRAN) injection 4 mg (4 mg Intravenous Given 11/19/20 1744)    Recent Lab Results:  Labs Reviewed   CBC, PLATELET & DIFFERENTIAL - Abnormal; Notable for the following components:        Result Value    RED BLOOD CELL COUNT 4.17 (*)     HEMOGLOBIN 12.6 (*)     HEMATOCRIT 38.2 (*)     All other components within normal limits   BASIC METABOLIC PANEL - Abnormal; Notable for the following components:    Glucose Random 287 (*)     All other components within normal limits   NT-PROBNP - Abnormal; Notable for the following components:    NT-proBNP 228 (*)     All other components within normal limits   TROPONIN T HS BASELINE   TROPONIN T HS 1 HOUR   RESPIRATORY PANEL BASIC INPAT    Narrative:     Is this the patient's first Covid test?->No  Is the patient employed in healthcare?->Unknown  Is the patient symptomatic as defined by the CDC?->No  Is the patient hospitalized?->Yes  Is this an ICU patient?->No  Is the patient a resident in a congregate care setting?  (nursing home, residential care, psych facili  ties,group home, homeless shelter, foster care.  etc)->Unknown  Occupation->Unknown  Disability->Unknown        PMH:  Past Medical History:  No date: Acute myocardial infarction, unspecified site, episode of   care unspecified  No date: CAD (coronary artery disease)  No date: Hypercholesteremia  No date: MI (myocardial infarction) (HCC)  No date: Mitral valve prolapse Patient Vitals for the past 720 hrs:   Temp Pulse Resp BP SpO2   11/19/20 1640 98.8 F 75 18 150/86 98 %      PSH:  Past Surgical History:  2007: CABG W/ARTERIAL GRAFT SINGLE ARTERIAL GRAFT  No date: PR MINIMALLY INVASIVE DIRECT CO Meds:  Current Facility-Administered Medications   Medication   . oxyCODONE (ROXICODONE) IR tablet 5 mg   . acetaminophen (TYLENOL) tablet 1,000 mg   . aluminum-magnesium hydroxide (MAALOX) 200-200 mg/5 mL suspension 30 mL     Current Outpatient Medications   Medication Sig   . METOPROLOL TARTRATE OR None Entered   . ASPIRIN 81 MG OR TABS daily   . LIPITOR 20 MG OR TABS daily      FH:  History reviewed.  No pertinent family history.    SH:  Social History    Tobacco Use      Smoking status: Current Every Day  Smoker        Packs/day: 0.25      Smokeless tobacco: Never Used  Alcohol use: Not on file   Allergies:  Review of Patient's Allergies indicates:   Ketorolac trometham*    Hives   Morphine and related    Anaphylaxis   Nitroglycerin           Hives, Other (See Comments)    Comment:"syncope and hives"             Patient states "passess out"             States drops his BP "too much"   Morphine sulfate-na*        Comment:Pt. States can take dilaudid and other narcotics   Motrin [ibuprofen]      Hives   Oxycodone               Other (See Comments)    Comment:States makes him feel funny   Toradol [ketorolac *    Other (See Comments)   Tramadol                Other (See Comments)   Nitrogen                Hives     I have reviewed the ED nursing notes and prior records. I have reviewed the patient's past medical history/problem list, allergies, social history and medication list. Prior records as available electronically through the Epic record were reviewed.     Marlena Clipper, MD  Attending Physician, [place]  Clinical Instructor, [employer]  Director of Emergency Informatics, [facility]

## 2020-11-25 ENCOUNTER — Inpatient Hospital Stay: Admit: 2020-11-25 | Discharge: 2020-11-26 | Payer: Medicaid - Out of State | Attending: Internal Medicine

## 2020-11-25 ENCOUNTER — Encounter: Admit: 2020-11-25 | Payer: PRIVATE HEALTH INSURANCE

## 2020-11-25 ENCOUNTER — Emergency Department: Admit: 2020-11-25 | Payer: Medicaid - Out of State

## 2020-11-25 DIAGNOSIS — E7800 Pure hypercholesterolemia, unspecified: Secondary | ICD-10-CM

## 2020-11-25 DIAGNOSIS — Z951 Presence of aortocoronary bypass graft: Secondary | ICD-10-CM

## 2020-11-25 DIAGNOSIS — I2510 Atherosclerotic heart disease of native coronary artery without angina pectoris: Secondary | ICD-10-CM

## 2020-11-25 DIAGNOSIS — R0789 Other chest pain: Secondary | ICD-10-CM

## 2020-11-25 DIAGNOSIS — I219 Acute myocardial infarction, unspecified: Secondary | ICD-10-CM

## 2020-11-25 DIAGNOSIS — I10 Essential (primary) hypertension: Secondary | ICD-10-CM

## 2020-11-25 MED ORDER — MORPHINE 4 MG/ML INTRAVENOUS SOLUTION
4 mg/mL | Freq: Once | INTRAVENOUS | Status: CP
Start: 2020-11-25 — End: ?
  Administered 2020-11-26: 04:00:00 4 mL via INTRAVENOUS

## 2020-11-25 MED ORDER — ONDANSETRON HCL (PF) 4 MG/2 ML INJECTION SOLUTION
42 mg/2 mL | Freq: Once | INTRAVENOUS | Status: CP
Start: 2020-11-25 — End: ?
  Administered 2020-11-26: 04:00:00 4 mL via INTRAVENOUS

## 2020-11-26 ENCOUNTER — Ambulatory Visit: Admit: 2020-11-26 | Payer: Medicaid - Out of State

## 2020-11-26 ENCOUNTER — Encounter: Admit: 2020-11-26 | Payer: PRIVATE HEALTH INSURANCE | Attending: Internal Medicine

## 2020-11-26 DIAGNOSIS — I252 Old myocardial infarction: Secondary | ICD-10-CM

## 2020-11-26 DIAGNOSIS — R42 Dizziness and giddiness: Secondary | ICD-10-CM

## 2020-11-26 DIAGNOSIS — E7800 Pure hypercholesterolemia, unspecified: Secondary | ICD-10-CM

## 2020-11-26 DIAGNOSIS — F17210 Nicotine dependence, cigarettes, uncomplicated: Secondary | ICD-10-CM

## 2020-11-26 DIAGNOSIS — E669 Obesity, unspecified: Secondary | ICD-10-CM

## 2020-11-26 DIAGNOSIS — Z20822 Contact with and (suspected) exposure to covid-19: Secondary | ICD-10-CM

## 2020-11-26 DIAGNOSIS — Z951 Presence of aortocoronary bypass graft: Secondary | ICD-10-CM

## 2020-11-26 DIAGNOSIS — R110 Nausea: Secondary | ICD-10-CM

## 2020-11-26 DIAGNOSIS — G894 Chronic pain syndrome: Secondary | ICD-10-CM

## 2020-11-26 DIAGNOSIS — Z6832 Body mass index (BMI) 32.0-32.9, adult: Secondary | ICD-10-CM

## 2020-11-26 DIAGNOSIS — Z91018 Allergy to other foods: Secondary | ICD-10-CM

## 2020-11-26 DIAGNOSIS — Z560 Unemployment, unspecified: Secondary | ICD-10-CM

## 2020-11-26 DIAGNOSIS — Z885 Allergy status to narcotic agent status: Secondary | ICD-10-CM

## 2020-11-26 DIAGNOSIS — Z888 Allergy status to other drugs, medicaments and biological substances status: Secondary | ICD-10-CM

## 2020-11-26 DIAGNOSIS — I2510 Atherosclerotic heart disease of native coronary artery without angina pectoris: Secondary | ICD-10-CM

## 2020-11-26 DIAGNOSIS — Z79899 Other long term (current) drug therapy: Secondary | ICD-10-CM

## 2020-11-26 DIAGNOSIS — R0789 Other chest pain: Secondary | ICD-10-CM

## 2020-11-26 DIAGNOSIS — Z886 Allergy status to analgesic agent status: Secondary | ICD-10-CM

## 2020-11-26 DIAGNOSIS — I10 Essential (primary) hypertension: Secondary | ICD-10-CM

## 2020-11-26 DIAGNOSIS — Z7982 Long term (current) use of aspirin: Secondary | ICD-10-CM

## 2020-11-26 DIAGNOSIS — I219 Acute myocardial infarction, unspecified: Secondary | ICD-10-CM

## 2020-11-26 LAB — LIPID PROFILE (EXT)
Cholesterol (EXT): 139 mg/dL
HDL Cholesterol (EXT): 33 mg/dL — ABNORMAL LOW
LDL Cholesterol, CALC (EXT): 82 mg/dL
Triglycerides (EXT): 137 mg/dL

## 2020-11-26 LAB — UNMAPPED LAB RESULTS: Phosphorous (EXT): 3.9 mg/dL (ref 2.5–4.9)

## 2020-11-26 LAB — EKG

## 2020-11-26 LAB — COVID-19 CARE EVERYWHERE: COVID-19 CARE EVERYWHERE: NEGATIVE

## 2020-11-26 LAB — COMPREHENSIVE METABOLIC PANEL
BKR ALANINE AMINOTRANSFERASE (ALT): 128 U/L — ABNORMAL HIGH (ref 16–61)
BKR ALBUMIN: 3.6 g/dL (ref 3.4–5.0)
BKR ALKALINE PHOSPHATASE: 68 U/L (ref 45–117)
BKR ANION GAP (LM): 4 mmol/L — ABNORMAL LOW (ref 5–15)
BKR ASPARTATE AMINOTRANSFERASE (AST): 76 U/L — ABNORMAL HIGH (ref 15–37)
BKR BILIRUBIN TOTAL: 0.3 mg/dL (ref <1.0)
BKR BLOOD UREA NITROGEN: 12 mg/dL (ref 7–18)
BKR CALCIUM: 9.6 mg/dL (ref 8.5–10.1)
BKR CHLORIDE: 107 mmol/L (ref 98–107)
BKR CO2: 29 mmol/L (ref 21–32)
BKR CREATININE: 0.95 mg/dL (ref 0.70–1.30)
BKR EGFR (AFR AMER) (LMC): >60 mL/min/{1.73_m2} (ref >60)
BKR EGFR (NON AFR AMER) (LMC): >60 mL/min/{1.73_m2} (ref >60)
BKR GLOBULIN: 4.2 g/dL (ref 2.5–5.0)
BKR GLUCOSE: 243 mg/dL — ABNORMAL HIGH (ref 65–110)
BKR POTASSIUM: 3.9 mmol/L (ref 3.5–5.1)
BKR PROTEIN TOTAL: 7.8 g/dL (ref 6.4–8.2)
BKR SODIUM: 140 mmol/L (ref 136–145)

## 2020-11-26 LAB — DRUGS OF ABUSE, URINE     (WH)
BKR AMPHETAMINE SCREEN, URINE: NEGATIVE
BKR BARBITURATE SCREEN, URINE: NEGATIVE
BKR BENZODIAZEPINE SCREEN, URINE: NEGATIVE
BKR CANNABINOID SCREEN, URINE: NEGATIVE
BKR COCAINE SCREEN, URINE: NEGATIVE
BKR METHADONE SCREEN, URINE: NEGATIVE
BKR OPIATE SCREEN, URINE: POSITIVE — AB
BKR PHENCYCLIDINE SCREEN, URINE: NEGATIVE

## 2020-11-26 LAB — LIPID PANEL
BKR CHOLESTEROL: 139 mg/dL
BKR HDL CHOLESTEROL: 33 mg/dL — ABNORMAL LOW
BKR LDL CHOLESTEROL SAMPSON CALCULATED: 82 mg/dL
BKR TRIGLYCERIDES: 137 mg/dL

## 2020-11-26 LAB — CBC WITH AUTO DIFFERENTIAL
BKR WAM ABSOLUTE IMMATURE GRANULOCYTES.: 0.02 x 1000/ÂµL (ref 0.00–0.30)
BKR WAM ABSOLUTE LYMPHOCYTE COUNT.: 1.84 x 1000/ÂµL (ref 0.60–3.70)
BKR WAM ABSOLUTE NEUTROPHIL COUNT.: 3.52 x 1000/ÂµL (ref 2.00–7.60)
BKR WAM BASOPHIL ABSOLUTE COUNT.: 0.02 x 1000/ÂµL (ref 0.00–1.00)
BKR WAM BASOPHILS: 0.3 % (ref 0.0–1.4)
BKR WAM EOSINOPHIL ABSOLUTE COUNT.: 0.09 x 1000/ÂµL (ref 0.00–1.00)
BKR WAM EOSINOPHILS: 1.5 % (ref 0.0–5.0)
BKR WAM HEMATOCRIT (2 DEC): 41.2 % (ref 38.50–50.00)
BKR WAM HEMATOCRIT (2 DEC): 40.9 % (ref 38.50–50.00)
BKR WAM HEMOGLOBIN: 13.4 g/dL (ref 13.2–17.1)
BKR WAM HEMOGLOBIN: 13.1 g/dL — ABNORMAL LOW (ref 13.2–17.1)
BKR WAM IMMATURE GRANULOCYTES: 0.3 % (ref 0.0–1.0)
BKR WAM LYMPHOCYTES: 31.3 % (ref 17.0–50.0)
BKR WAM MCH PG: 30 pg (ref 27.0–33.0)
BKR WAM MCH PG: 29.9 pg (ref 27.0–33.0)
BKR WAM MCHC: 32.5 g/dL (ref 31.0–36.0)
BKR WAM MCHC: 32 g/dL (ref 31.0–36.0)
BKR WAM MCV: 92.4 fL (ref 80.0–100.0)
BKR WAM MCV: 93.4 fL (ref 80.0–100.0)
BKR WAM MONOCYTE ABSOLUTE COUNT.: 0.38 x 1000/ÂµL (ref 0.00–1.00)
BKR WAM MONOCYTES: 6.5 % (ref 4.0–12.0)
BKR WAM MPV: 10.8 fL (ref 8.0–12.0)
BKR WAM MPV: 10.4 fL (ref 8.0–12.0)
BKR WAM NEUTROPHILS: 60.1 % (ref 39.0–72.0)
BKR WAM NUCLEATED RED BLOOD CELLS: 0 % (ref 0.0–1.0)
BKR WAM NUCLEATED RED BLOOD CELLS: 0 % (ref 0.0–1.0)
BKR WAM PLATELETS: 215 x1000/ÂµL (ref 150–420)
BKR WAM PLATELETS: 179 x1000/ÂµL (ref 150–420)
BKR WAM RDW-CV: 12.6 % (ref 11.0–15.0)
BKR WAM RDW-CV: 12.7 % (ref 11.0–15.0)
BKR WAM RED BLOOD CELL COUNT.: 4.46 M/ÂµL (ref 4.00–6.00)
BKR WAM RED BLOOD CELL COUNT.: 4.38 M/ÂµL (ref 4.00–6.00)
BKR WAM WHITE BLOOD CELL COUNT: 5.9 x1000/ÂµL (ref 4.0–11.0)
BKR WAM WHITE BLOOD CELL COUNT: 5.3 x1000/ÂµL (ref 4.0–11.0)

## 2020-11-26 LAB — MANUAL DIFFERENTIAL
BKR WAM ATYPICAL LYMPHOCYTES (DIFF) 1 DEC: 5.2 % — ABNORMAL HIGH (ref 0.0–1.0)
BKR WAM EOSINOPHILS (DIFF) 2 DEC: 0.14 x 1000/ÂµL (ref 0.00–1.00)
BKR WAM EOSINOPHILS (DIFF): 2.6 % (ref 0.0–5.0)
BKR WAM LYMPHOCYTE - ABS (DIFF) 2 DEC: 2.02 x 1000/ÂµL (ref 0.60–3.70)
BKR WAM LYMPHOCYTES (DIFF): 33 % (ref 17.0–50.0)
BKR WAM MONOCYTE - ABS (DIFF) 2 DEC: 0.32 x 1000/ÂµL (ref 0.00–1.00)
BKR WAM MONOCYTES (DIFF): 6.1 % (ref 4.0–12.0)
BKR WAM NEUTROPHILS (DIFF): 53.1 % (ref 39.0–72.0)
BKR WAM NEUTROPHILS - ABS (DIFF) 2 DEC: 2.81 x 1000/ÂµL (ref 2.00–7.60)
BKR WAM NUCLEATED RED BLOOD CELLS (DIFF) 1 DEC: 0.9 /100 (ref 0.0–1.0)
BKR WAM PLATELET ESTIMATE: NORMAL

## 2020-11-26 LAB — TROPONIN T HIGH SENSITIVITY, 1 HOUR WITH REFLEX (BH GH LMW YH)
BKR TROPONIN T HS 1 HOUR DELTA FROM 0 HOUR: 1 ng/L
BKR TROPONIN T HS 1 HOUR: 7 ng/L

## 2020-11-26 LAB — BASIC METABOLIC PANEL
BKR ANION GAP (LM): 4 mmol/L — ABNORMAL LOW (ref 5–15)
BKR BLOOD UREA NITROGEN: 11 mg/dL (ref 7–18)
BKR CALCIUM: 8.9 mg/dL (ref 8.5–10.1)
BKR CHLORIDE: 107 mmol/L (ref 98–107)
BKR CO2: 30 mmol/L (ref 21–32)
BKR CREATININE: 0.99 mg/dL (ref 0.70–1.30)
BKR EGFR (AFR AMER) (LMC): >60 mL/min/{1.73_m2} (ref >60)
BKR EGFR (NON AFR AMER) (LMC): >60 mL/min/{1.73_m2} (ref >60)
BKR GLUCOSE: 294 mg/dL — ABNORMAL HIGH (ref 65–110)
BKR POTASSIUM: 3.6 mmol/L (ref 3.5–5.1)
BKR SODIUM: 141 mmol/L (ref 136–145)

## 2020-11-26 LAB — TROPONIN T HIGH SENSITIVITY, 0 HOUR BASELINE WITH REFLEX (BH GH LMW YH): BKR TROPONIN T HS 0 HOUR BASELINE: 6 ng/L

## 2020-11-26 LAB — MAGNESIUM: BKR MAGNESIUM: 1.6 mg/dL (ref 1.6–2.6)

## 2020-11-26 LAB — D-DIMER, QUANTITATIVE: BKR D-DIMER: 0.66 mg{FEU}/L — ABNORMAL HIGH (ref <0.50)

## 2020-11-26 LAB — SARS COV-2 (COVID-19) RNA: BKR SARS-COV-2 RNA (COVID-19) (YH): NEGATIVE

## 2020-11-26 LAB — PHOSPHORUS     (BH GH L LMW YH): BKR PHOSPHORUS: 3.9 mg/dL (ref 2.5–4.9)

## 2020-11-26 MED ORDER — NICOTINE 7 MG/24 HR DAILY TRANSDERMAL PATCH
7 mg | Freq: Every day | TRANSDERMAL | Status: DC
Start: 2020-11-26 — End: 2020-11-27

## 2020-11-26 MED ORDER — TECHNETIUM TC-99M SESTAMIBI INJECTION
Freq: Once | INTRAVENOUS | Status: CP | PRN
Start: 2020-11-26 — End: ?
  Administered 2020-11-26: 14:00:00 via INTRAVENOUS

## 2020-11-26 MED ORDER — SODIUM CHLORIDE 0.9 % INTRAVENOUS SOLUTION
INTRAVENOUS | Status: AC
Start: 2020-11-26 — End: ?
  Administered 2020-11-26: 17:00:00 via INTRAVENOUS

## 2020-11-26 MED ORDER — SODIUM CHLORIDE 0.9 % (FLUSH) INJECTION SYRINGE
0.9 % | INTRAVENOUS | Status: DC | PRN
Start: 2020-11-26 — End: 2020-11-27
  Administered 2020-11-26: 14:00:00 0.9 mL via INTRAVENOUS

## 2020-11-26 MED ORDER — ROSUVASTATIN 20 MG TABLET
20 mg | Freq: Every day | ORAL | Status: DC
Start: 2020-11-26 — End: 2020-11-27
  Administered 2020-11-26: 13:00:00 20 mg via ORAL

## 2020-11-26 MED ORDER — ASPIRIN IMMEDIATE RELEASE 325 MG TABLET
325 mg | Freq: Every day | ORAL | Status: DC
Start: 2020-11-26 — End: 2020-11-27
  Administered 2020-11-26: 13:00:00 325 mg via ORAL

## 2020-11-26 MED ORDER — METOPROLOL SUCCINATE ER 25 MG TABLET,EXTENDED RELEASE 24 HR
25 mg | Freq: Every day | ORAL | Status: DC
Start: 2020-11-26 — End: 2020-11-27
  Administered 2020-11-26: 13:00:00 25 mg via ORAL

## 2020-11-26 MED ORDER — DIPHENHYDRAMINE 50 MG CAPSULE
50 mg | Freq: Once | ORAL | Status: CP
Start: 2020-11-26 — End: ?
  Administered 2020-11-26: 07:00:00 50 mg via ORAL

## 2020-11-26 MED ORDER — ENOXAPARIN 40 MG/0.4 ML SUBCUTANEOUS SYRINGE
40 mg/0.4 mL | SUBCUTANEOUS | Status: DC
Start: 2020-11-26 — End: 2020-11-27
  Administered 2020-11-26: 13:00:00 40 mg/0.4 mL via SUBCUTANEOUS

## 2020-11-26 MED ORDER — MORPHINE 4 MG/ML INTRAVENOUS SOLUTION
4 mg/mL | Freq: Once | INTRAVENOUS | Status: CP
Start: 2020-11-26 — End: ?
  Administered 2020-11-26: 06:00:00 4 mL via INTRAVENOUS

## 2020-11-26 MED ORDER — IOHEXOL 350 MG IODINE/ML INTRAVENOUS SOLUTION
350 mg iodine/mL | Freq: Once | INTRAVENOUS | Status: CP | PRN
Start: 2020-11-26 — End: ?
  Administered 2020-11-26: 06:00:00 350 mL via INTRAVENOUS

## 2020-11-26 MED ORDER — SODIUM CHLORIDE 0.9 % BOLUS (NEW BAG)
0.9 % | Freq: Once | INTRAVENOUS | Status: CP
Start: 2020-11-26 — End: ?
  Administered 2020-11-26: 06:00:00 0.9 mL/h via INTRAVENOUS

## 2020-11-26 MED ORDER — SODIUM CHLORIDE 0.9 % (FLUSH) INJECTION SYRINGE
0.9 % | INTRAVENOUS | Status: DC | PRN
Start: 2020-11-26 — End: 2020-11-27

## 2020-11-26 MED ORDER — MORPHINE 4 MG/ML INTRAVENOUS SOLUTION
4 mg/mL | INTRAVENOUS | Status: DC | PRN
Start: 2020-11-26 — End: 2020-11-27
  Administered 2020-11-26 (×4): 4 mL via INTRAVENOUS

## 2020-11-26 NOTE — Utilization Review (ED)
UM Status: UR Reviewed-Continue Observation Services Obs for CP. Trop 6, 7. Plan for stress test and cardiology consult. VSS, labs unremarkable. Patient to remain in observation status at this time of review. UM to continue to follow.

## 2020-11-26 NOTE — ED Notes
1:26 AM patient was medicated prior as ordered see MAR for dose and time.  Patient reports still has c/p 7/10 Dr Anne Hahn advised.  Patient also needs CTA --IV nurse contacted for 20G or diffusix --IV nurse at bedside 1:46 AM patient medicated as ordered see MAR for dose and time. 3:01 AM patient c/o hives left arm at IV sight.  Assess location-patient does have hives.  Notified inpatient hospitalist Shay G who gave verbal order for benadryl po 50 mg4:02 AM patient stating the doc ordered me pain meds advised patient morphine  Is Q3 hours and not due yet-patient stated understanding.   5:37 AM patient sleeping soundly on stretcher. Patient in NAD at this time will continue to monitor 6:26 AM obtained patients vital signs.  Patient barley woke up while v/s were obtained

## 2020-11-26 NOTE — Utilization Review (ED)
UM Status: Meets Observation Status  Patient admitted for CP. + Significant cardiac hx. Troponin x 1 negative. Imaging results pending at the time of review. VS not recorded for this visit at the time of review. Morphine x 2 and Zofran x 1 given.

## 2020-11-26 NOTE — Progress Notes
Pt unable to complete Nuclear study . Resting images obtained . Patient became very dizzy on treadmill. No change in EKG or BP . No change in baseline constant chest pain. No stress images obtained. Chemical stress test refused. Pateint transferred back to ER for further evaluation.

## 2020-11-26 NOTE — Other
[facility]	Cardiology Consult Note Consulting Provider:  Camila Li, MD FACCDiagnoses & Recommendations: Chest pain, CAD (anomalous RCA s/p CABG)HypertensionChronic pain syndrome-ACS is ruled out-check echocardiogram and stress test-increase beta-blocker-aspirin and statin-if normal tests patient can be discharged Addendum:Patient refused chemical stress test and his exercise test is non diagnostic due to inability to reach the target heart rate.  He managed to exercise for 6 minutes and that by itself shows low risk test.  He developed dizziness.  He declined imaging after the stress part of the test.  He did want to have cardiac catheterization before having this stress test.  He has requested pain management although the pain is atypical.I do feel that his discomfort is not cardiac and he can have follow-up with Cardiology as outpatient.Recommend avoiding narcotics. We can try Ranexa or imdur but patient declined it in the past, based on prior records.Subjective: I saw Ricardo Ramirez in consultation for evaluation of chest pain.Ricardo Ramirez is a 45 year old smoker with hypertension and anomalous RCA, single-vessel CABG 2007 and, repeat bypass 2012.Presented to the hospital with left-sided chest discomfort, lasted 45 minutes, 9/10, squeezing and started at rest. Patient states that he still has chest pain and shows with one finger at precordial area with radiation to left arm and jaw.  He has had multiple ER visits with chest pain, declines nitro and wants narcotics. He signed AMA when suggested to add Ranexa to his medication. 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 s/p right internal mammary artery grafting to distal RCA. 04/2011 [place] cath: atretic RIMA graft to the RCA with a 70% stenosis. 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 Coconut Hives and Throat Closes ? Motrin [Ibuprofen]  ? Nitroglycerin  ? Toradol [Ketorolac]  ? Tramadol    Tolerated IV morphine 01/06/18  Medications The medication list has been updated and reviewed in the electronic medical record.(Not in a hospital admission)Inpatient Medications:Current Facility-Administered Medications Medication Dose Route Frequency Provider Last Rate Last Admin ? aspirin Immediate Release tablet 325 mg  325 mg Oral Daily Doyce Para, APRN     ? enoxaparin (LOVENOX) syringe 40 mg  40 mg Subcutaneous Daily Doyce Para, APRN     ? metoprolol succinate XL (TOPROL-XL) 24 hr tablet 50 mg  50 mg Oral Daily Doyce Para, APRN     ? nicotine (NICODERM CQ) transdermal patch 24 hr 7 mg  7 mg Transdermal Daily Doyce Para, APRN     ? rosuvastatin (CRESTOR) tablet 20 mg  20 mg Oral Daily Laural Benes F, APRN      morphine (ADULT), sodium chloride	Social and Family History:  He  reports that he has been smoking. He does not have any smokeless tobacco history on file. He reports that he does not drink alcohol and does not use drugs.  History reviewed. No pertinent family history.Review of Systems: Review of Systems Constitutional: Negative for activity change, appetite change, chills, fever and unexpected weight change. HENT: Negative for hearing loss, sore throat and trouble swallowing.  Eyes: Negative for pain  and discharge. Respiratory: Negative for cough, shortness of breath and wheezing.  Cardiovascular: Positive for chest pain. Negative for palpitations and leg swelling. Gastrointestinal: Negative for abdominal distention, abdominal pain, constipation, diarrhea, nausea and vomiting. Endocrine: Negative for cold intolerance and heat intolerance. Genitourinary: Negative for difficulty urinating, frequency, hematuria and urgency. Musculoskeletal: Negative for arthralgias, joint swelling and myalgias. Skin: Negative for color change. Neurological: Negative for dizziness, seizures, syncope, light-headedness and headaches. Hematological: Does not bruise/bleed easily. Psychiatric/Behavioral: Negative for confusion and sleep disturbance. The patient is not nervous/anxious.  Physical Exam: Vitals:BP 134/80  - Pulse (!) 56  - Temp 97.8 ?F (36.6 ?C) (Oral)  - Resp (!) 13  - Ht 6' 4 (1.93 m)  - Wt 121.9 kg  - SpO2 (!) 93%  - BMI 32.71 kg/m?  (268.74 lbs.)BP: (130-134)/(69-80) 134/80 (07/09 0623)Pulse:  [56-74] 56 (07/09 0626)Resp:  [12-22] 13 (07/09 0626)Wt Readings from Last 3 Encounters: 11/25/20 121.9 kg 01/06/18 124 kg Gross Totals (Last 24 hours) at 11/26/2020 0758Last data filed at 11/26/2020 0535Intake -- Output 300 ml Net -300 ml Physical ExamConstitutional:     Appearance: He is well-developed. HENT:    Head: Normocephalic and atraumatic. Eyes:    Pupils: Pupils are equal, round, and reactive to light. Neck:    Vascular: No JVD. Cardiovascular:    Rate and Rhythm: Normal rate and regular rhythm.    Heart sounds: Normal heart sounds. No murmur heard.  No friction rub. No gallop. Pulmonary:    Effort: Pulmonary effort is normal. No respiratory distress.    Breath sounds: Normal breath sounds. No wheezing or rales. Chest:    Chest wall: No tenderness. Abdominal:    General: Bowel sounds are normal. There is no distension.    Palpations: Abdomen is soft. There is no mass.    Tenderness: There is no abdominal tenderness. There is no rebound. Musculoskeletal:    Cervical back: Neck supple. Skin:   General: Skin is warm and dry.    Findings: No erythema or rash. Neurological:    Mental Status: He is alert and oriented to person, place, and time. Psychiatric:       Behavior: Behavior normal. Review of Labs/Diagnostics: Lab Review:CBCRecent Labs Lab 07/08/222353 07/09/220623 WBC 5.9 5.3 HGB 13.4 13.1* HCT 41.20 40.90 PLT 215 179 CoagsNo results for input(s): INR, PTT in the last 168 hours. BMPRecent Labs Lab 07/08/222353 07/09/220623 NA 140 141 K 3.9 3.6 CL 107 107 CO2 29 30 BUN 12 11 CREATININE 0.95 0.99 CALCIUM 9.6 8.9 MG  --  1.6 PHOS  --  3.9    LipidsLab Results Component Value Date  CHOL 139 11/26/2020  HDL 33 (L) 11/26/2020  LDL 82 11/26/2020  TRIG 137 11/26/2020  LFTsRecent Labs Lab 07/08/222353 ALT 128* AST 76* ALKPHOS 68 BILITOT 0.3     Diagnostic Review:Chest x-ray-no abnormality Tele Events:  Sinus rhythmECG:  11/26/2020-sinus rhythm, heart rate 55, normal axis, left atrial enlargementElectronically Signed by Camila Li, MD, November 26, 2020

## 2020-11-26 NOTE — ED Provider Notes
HistoryChief Complaint Patient presents with ? Chest Pain   Patient biba w/ c/o c/p left anterior chest radiating to left arm and neck 9/10 squeezing, sharp in nature x 20 min-patient allergic to nitro so not able to take -significant cardiac hx--   Patient presents with chest pain going up into his left neck as well as down his left arm.  Patient states that this has been going to proximally 45 to 50 minutes.  He has no shortness of breath.  He has no other associated complaints with that.  Patient states that he does have heart disease and has had several heart attacks in the past.  He states that this feels like his heart disease.  He has no diaphoresis, shortness of breath, nausea or vomiting.The history is provided by the patient. Chest PainPain location:  L chestPain quality: pressure  Radiates to: Left jaw and left arm.Pain severity:  ModerateOnset quality:  Unable to specifyTiming:  ConstantProgression:  UnchangedRelieved by:  NothingWorsened by:  NothingIneffective treatments:  None triedAssociated symptoms: no abdominal pain, no back pain, no fever, no heartburn, no nausea, no orthopnea, no shortness of breath, no vomiting and no weakness   Past Medical History: Diagnosis Date ? Chronic coronary artery disease  ? History of single vessel coronary artery bypass  ? Hypercholesteremia  ? Hypertension  ? MI (myocardial infarction) (HC Code) (HC CODE) (HC Code)  Past Surgical History: Procedure Laterality Date ? CARDIAC SURGERY   History reviewed. No pertinent family history.Social History Socioeconomic History ? Marital status: Single Tobacco Use ? Smoking status: Current Every Day Smoker Substance and Sexual Activity ? Alcohol use: Never ? Drug use: Never ED Other Social History E-cigarette/Vaping Substances E-cigarette/Vaping Devices Review of Systems Constitutional: Negative for fever. Respiratory: Negative for shortness of breath.  Cardiovascular: Positive for chest pain. Negative for orthopnea. Gastrointestinal: Negative for abdominal pain, heartburn, nausea and vomiting. Musculoskeletal: Negative for back pain. Neurological: Negative for weakness. All other systems reviewed and are negative. Physical ExamED Triage VitalsBP: n/aPulse: n/aPulse from  O2 sat: n/aResp: n/aTemp: n/aTemp src: n/aSpO2: n/a Ht 6' 4 (1.93 m)  - Wt 121.9 kg  - BMI 32.71 kg/m? Physical ExamVitals and nursing note reviewed. Constitutional:     Appearance: Normal appearance. HENT:    Head: Normocephalic. Eyes:    Pupils: Pupils are equal, round, and reactive to light. Cardiovascular:    Rate and Rhythm: Normal rate and regular rhythm.    Heart sounds: Normal heart sounds. Pulmonary:    Effort: Pulmonary effort is normal.    Breath sounds: Normal breath sounds. Abdominal:    Palpations: Abdomen is soft.    Tenderness: There is no abdominal tenderness. Skin:   General: Skin is warm. Neurological:    General: No focal deficit present.    Mental Status: He is alert.  ProceduresProcedures ED COURSEPatient Reevaluation: Patient presents with left-sided chest pain.  He does have a significant past medical history of having acute MI.  Patient's EKG is within normal limits at this time.  Will get troponins and admit patient for further observation as he does have a significant past history and does not currently have a cardiologist.Results for orders placed or performed during the hospital encounter of 11/25/20-Troponin T High Sensitivity, Emergency; 0 hour baseline with reflex (1 hour and 3 hour):      Result  Value                         Ref Range                     High Sensitivity Troponin T                       6                             See Comment ng/L -D-dimer, quantitative:      Result                                            Value                         Ref Range                     D-Dimer                                           0.66 (H)                      <0.50 mg/L FEU           -CBC auto differential:      Result                                            Value                         Ref Range                     WBC                                               5.9                           4.0 - 11.0 x1000/?L           RBC                                               4.46                          4.00 - 6.00 M/?L              Hemoglobin                                        13.4  13.2 - 17.1 g/dL              Hematocrit                                        41.20                         38.50 - 50.00 %               MCV                                               92.4                          80.0 - 100.0 fL               MCH                                               30.0                          27.0 - 33.0 pg                MCHC                                              32.5                          31.0 - 36.0 g/dL              RDW-CV                                            12.6                          11.0 - 15.0 %                 Platelets                                         215                           150 - 420 x1000/?L            MPV                                               10.8  8.0 - 12.0 fL                 Neutrophils                                       60.1                          39.0 - 72.0 %                 Lymphocytes                                       31.3                          17.0 - 50.0 %                 Monocytes                                         6.5                           4.0 - 12.0 %                  Eosinophils                                       1.5                           0.0 - 5.0 % Basophil                                          0.3                           0.0 - 1.4 %                   Immature Granulocytes                             0.3                           0.0 - 1.0 %                   nRBC                                              0.0                           0.0 - 1.0 %  ANC (Abs Neutrophil Count)                        3.52                          2.00 - 7.60 x 1000/?L         Absolute Lymphocyte Count                         1.84                          0.60 - 3.70 x 1000/?L         Monocyte Absolute Count                           0.38                          0.00 - 1.00 x 1000/?L         Eosinophil Absolute Count                         0.09                          0.00 - 1.00 x 1000/?L         Basophil Absolute Count                           0.02                          0.00 - 1.00 x 1000/?L         Absolute Immature Granulocyte Count               0.02                          0.00 - 0.30 x 1000/?L    -Comprehensive metabolic panel:      Result                                            Value                         Ref Range                     Sodium                                            140                           136 - 145 mmol/L              Potassium  3.9                           3.5 - 5.1 mmol/L              Chloride                                          107                           98 - 107 mmol/L               CO2                                               29                            21 - 32 mmol/L                Anion Gap                                         4 (L)                         5 - 15 mmol/L                 Glucose                                           243 (H)                       65 - 110 mg/dL                BUN                                               12                            7 - 18 mg/dL Creatinine                                        0.95                          0.70 - 1.30 mg/dL             eGFR (AFRICAN-AMERICAN)                           >60                           >  60 mL/min/1.50m2             eGFR (NON African-American)                       >60                           >60 mL/min/1.22m2             Calcium                                           9.6                           8.5 - 10.1 mg/dL              Total Protein                                     7.8                           6.4 - 8.2 g/dL                Albumin                                           3.6                           3.4 - 5.0 g/dL                Globulin                                          4.2                           2.5 - 5.0 g/dL                Total Bilirubin                                   0.3                           <1.0 mg/dL                    Alkaline Phosphatase                              68                            45 - 117 U/L                  Alanine Aminotransferase (ALT)  128 (H)                       16 - 61 U/L                   Aspartate Aminotransferase (AST)                  76 (H)                        15 - 37 U/L              -EKG:      Result                                            Value                         Ref Range                     ECG - HEART RATE                                  77                            bpm                           ECG - QRS Interval                                102                           ms                            ECG - QT Interval                                 367                           ms                            ECG - QTC Interval                                416                           ms                            ECG - P Axis  41                            deg                           ECG - QRS Axis 37                            deg                           ECG - T Wave Axis                                 67                            deg                           ECG -- P-R Interval                               162                           msec                          ECG - SEVERITY                                    Borderline ECG                severity                 Clinical Impressions as of 11/26/20 0127 Chest pain, unspecified type  ED DispositionObservation Theodoro Grist, MD07/08/22 2359 Theodoro Grist, MD07/09/22 0127

## 2020-11-26 NOTE — Plan of Care
Plan of Care Overview/ Patient Status    Pt is alert & oriented x 4. VSS on RA. PT complains of chest pain and requesting morphine. Morphine was too early to give and pt requesting oxy instead as it is the only other med that works. Anselmo Rod made aware of chest pain. PRN morphine given x 1 with moderate effect. His EKG showed SR 54. Continued on tele. No other acute changes noted.  1924: Pt stating he would leave AMA if could not get percocet. [facility] notified, pt left AMA without signing AMA form statin I don't sign forms. Both IV's were removed.

## 2020-11-26 NOTE — ED Notes
7:00 AM Report from [place]. 7:46 AMPt sleeping in ED stretcher. Pt will go for stress test today, okay to have meds w/ sips of water, no caffeine. 8:26 AMPt to stress test. 12:46 PMFloor Handoff Telemetry: 	[x]  Yes		[]  NoCode Status:   [x]  Full		[]  DNR		[]  DNI		Other (specify):Safety Precautions: []  None	[]  Sitter   []  Restraints	[]  Suicidal	[x]  Fall Risk	Other (specify):Mentation/Orientation:	 A&O (Self, person, place, time) x   4       	 Disoriented to:                    	 Deficits: []  Hearing impaired	[]  Blind  []  Nonverbal	 []  Mental retardationOxygenation Upon Admission: [x]  RA	[]  NC	[]  Venti  []  Simple Mask []  Other	Baseline O2 Status? []  Yes	[]  NoAmbulation: []  Independent	[]  Cane   []  Walker	[]  Wheelchair	[]  Bedbound		[]  Hemiplegic	[]  Paraplegic	[]  QuadraplegicEliminiation: [x]  Independent	[]  Commode	[]  Bedpan/Urinal  []  Straight Cath []  Foley cath			[]  Urostomy	[]  Colostomy	Other (specify):Diarrhea/Loose stool : []  1x within 24h  []  2x within 24h  []  3x within 24h  [x]  None 	C.Diff Order: 	[]  Ordered- needs to be collected             []  Collected-sent to lab             []  Resulted - Negative C.Diff             []  Resulted - Positive C.Diff[x]  Not Ordered   []  N/ASkin Alteration: []  Pressure Injury []  Wound []  None [x]  Skin not assessedDiet: [x]  Regular/No order placed	[]  NPO		Other (specify):IV Access: [x]  PIV   []  PICC    []  Port    [] Central line    []  A-line    Other (specify)IVF/GTT Running Upon ED Departure? [x]  No	    []  Yes (specify):Outstanding Meds/Treatments/Tests:Patient Belongings:Are the belongings documented?          [x]  No	    []  YesIs someone taking belongings home?   []  No     []  Yes  Who? (specify)                                   ED RN and Contact number/MHB #:     Phala Schraeder, RN ext [phone]

## 2020-11-26 NOTE — Other
PHARMACY-ASSISTED MEDICATION REPORTPharmacist review of the best possible medication history obtained by the pharmacy medication history technician has been performed.  I have updated the home medication list and identified the following information that may be relevant to this admission.NOTES/RECOMMENDATIONS Medication list was updated to best possible statePatient  provided good medication history including exact dose/frequency during interviewFollowing resources were utilized to confirm the list: Pharmacy record - Epic Surescripts Database.       Prior to Admission Medications Medication Name Sig Taking? Patient Reported   aspirin 81 MG chewable tabletLast dose: 7/7/2022Last Medication Note: >> Dennison Bulla Jan 06, 2018  8:35 PM.Entered by Martina Sinner, LPN Mon Jan 06, 2018 2035 Take 81 mg by mouth daily. Patient takes 4 tablets daily Yes Yes   atorvastatin (LIPITOR) 40 MG tabletLast dose: 7/7/2022Last Medication Note: >> Iva Lento   Sat Nov 26, 2020  2:21 AMMedHX Tech(Adelle Glenna Durand, CPHT):  Patient reported that they are taking as prescribed, but last confirmed fill date was: [08/20/20]Entered by Iva Lento, CPHT Sat Nov 26, 2020 0221 Take 40 mg by mouth daily.  Yes Yes   metoprolol succinate XL (TOPROL-XL) 50 mg 24 hr tabletLast dose:  -- Last Medication Note: >> PADUA, ADELLE   Sat Nov 26, 2020  2:21 AMMedHX Tech(Adelle Glenna Durand, CPHT):  Patient reported that they are taking as prescribed, but last confirmed fill date was: [08/20/20]Entered by Iva Lento, CPHT Sat Nov 26, 2020 0221 Take 50 mg by mouth daily. Take with or immediately following a meal. Yes Yes   Discontinued: 11/26/2020  2:47 AMLast dose: Not Taking at Unknown timeLast Medication Note: >> Iva Lento   Sat Nov 26, 2020  2:20 AMMedHx Tech(Adelle Padua, CPHT): FLAG FOR REMOVAL -  Prescription was already completed as directed by patient's providerEntered by Iva Lento, CPHT Sat Nov 26, 2020 0220   Yes   Prior to admission medications last reviewed by Casimer Bilis, [facility] on Sat Nov 26, 2020 2831 Thank Kelvin Cellar, RPH7/9/20222:47 AM

## 2020-11-26 NOTE — H&P
[facility]		General Medicine History & PhysicalHistory provided by: the patientHistory limited by: no limitationsPatient [facility]: [facility]  July 9, 2022Subjective: Chief Complaint:  Chest painHPI this is a 45 year old male who comes in from home complaining of left-sided chest pain that started 45 - 50 minutes prior to arrival in the emergency room.  He reports his pain is 9/10, and describes it as squeezing.  Also has radiation to the left neck and upper extremity.  He was at rest, seated, when the pain started.  He denies shortness of breath or vomiting.  He does report having nausea and sweatiness.  He does have history of coronary artery disease status post myocardial infarction that is treated with baby aspirin.  He has undergone coronary artery bypass grafting of 1 vessel twice.  The 1st time was in [place] in 2007, the 2nd was in [place], [place] in 2012.  Has not had any significant chest pain since 2012.  He is had no recent illness.  Earlier in the day he was in his normal state of health.  Denies cocaine use.  He is had no sick contacts.  He did receive COVID 19 vaccine x2, but without any boosters.  He did receive an influenza vaccine last year.  He is had no recent prolonged travel, or traveled outside the [place].He is single.  Just moved back to [place] from [place], [place], 4 days ago.  Staying with an aunt and a cousin, both of which are in their normal state of health.  There is low suspicion for COVID-19 virus exposure.  He is completely independent in all aspects of activities of daily living.  Ambulates without any assistive device.  He does not drive a car.  He is presently unemployed.  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  He does continue to smoke 2 cigarettes per day.  He reports he used to smoke 2 packs per day.  Denies daily alcohol use.  Denies recreational drug use, specifically cocaine.  He is not prescribed medical marijuana.  His past medical history includes coronary artery disease status post myocardial infarction that is treated with baby aspirin, hypertension, dyslipidemia, active nicotine abuse, and obesity with a BMI of 32.7.  He denies any past psychiatric history.  His past surgical history includes coronary artery bypass grafting in 2007 and 2012.  He does not have a local primary care physician or cardiologist at this time.  Medical History: PMH PSH Past Medical History: Diagnosis Date ? Chronic coronary artery disease  ? History of single vessel coronary artery bypass  ? Hypercholesteremia  ? Hypertension  ? MI (myocardial infarction) (HC Code) (HC CODE) (HC Code)   Past Surgical History: Procedure Laterality Date ? CARDIAC SURGERY    Social History Family History Social History Tobacco Use ? Smoking status: Current Every Day Smoker ? Smokeless tobacco: Not on file Substance Use Topics ? Alcohol use: Never  History reviewed. No pertinent family history. Prior to Admission Medications Prior to Admission medications  Medication Sig Start Date End Date Taking? Authorizing Provider aspirin 81 MG chewable tablet Take 81 mg by mouth daily. Patient takes 4 tablets daily   Yes Provider, Historical atorvastatin (LIPITOR) 40 MG tablet Take 40 mg by mouth daily.    Yes Provider, Historical metoprolol succinate XL (TOPROL-XL) 50 mg 24 hr tablet Take 50 mg by mouth daily. Take with or immediately following a meal.   Yes Provider, Historical metoprolol tartrate (LOPRESSOR) 25 MG Immediate Release tablet Take 25  mg by mouth 2 (two) times daily.Patient not taking: Reported on 11/26/2020  11/26/20  Provider, Historical  Allergies Allergies Allergen Reactions ? Coconut Hives and Throat Closes ? Motrin [Ibuprofen]  ? Nitroglycerin  ? Toradol [Ketorolac]  ? Tramadol    Tolerated IV morphine 01/06/18   Review of Systems: Review of Systems constitutional, HEENT, cardiovascular, respiratory, GI, GU, musculoskeletal, neuro, and psych, were each evaluated and essentially negative except for as noted in the history of presenting illness section.Objective: Vitals:Last 24 hours: Temp:  [97.9 ?F (36.6 ?C)] 97.9 ?F (36.6 ?C)Pulse:  [57-74] 57Resp:  [12-22] 12BP: (130-131)/(69-79) 131/69SpO2:  [99 %] 99 %Physical Exam: Physical ExamVitals and nursing note reviewed. Constitutional:     General: He is not in acute distress.   Appearance: Normal appearance. He is obese. He is not ill-appearing, toxic-appearing or diaphoretic. HENT:    Head: Normocephalic and atraumatic.    Nose: Nose normal.    Mouth/Throat:    Mouth: Mucous membranes are moist. Eyes:    Pupils: Pupils are equal, round, and reactive to light. Cardiovascular:    Rate and Rhythm: Normal rate and regular rhythm.    Heart sounds: Normal heart sounds. No murmur heard.   Comments: Continues to have active chest pain.  His chest pain is not easily reproducible with sternal compression, forceful coughing, deep inspiration, range of motion, or position changes.Pulmonary:    Effort: Pulmonary effort is normal. No respiratory distress.    Breath sounds: Normal breath sounds. No stridor. No wheezing, rhonchi or rales.    Comments: Continues on room airChest:    Chest wall: No tenderness. Abdominal:    General: Abdomen is flat. Bowel sounds are normal. There is no distension.    Tenderness: There is no abdominal tenderness. There is no guarding or rebound.    Comments: No active nausea, vomiting, or diarrhea at this time Musculoskeletal:       General: No swelling or tenderness. Normal range of motion.    Cervical back: Normal range of motion and neck supple. Skin:   General: Skin is warm and dry. Neurological:    General: No focal deficit present.    Mental Status: He is alert and oriented to person, place, and time. Psychiatric:       Mood and Affect: Mood normal.       Behavior: Behavior normal.       Thought Content: Thought content normal.       Judgment: Judgment normal.  Labs: Last 24 hours: Recent Results (from the past 24 hour(s)) EKG  Collection Time: 11/25/20 10:57 PM Result Value Ref Range  ECG - HEART RATE 77 bpm  ECG - QRS Interval 102 ms  ECG - QT Interval 367 ms  ECG - QTC Interval 416 ms  ECG - P Axis 41 deg  ECG - QRS Axis 37 deg  ECG - T Wave Axis 67 deg  ECG -- P-R Interval 162 msec  ECG - SEVERITY Borderline ECG severity Troponin T High Sensitivity, Emergency; 0 hour baseline with reflex (1 hour and 3 hour)  Collection Time: 11/25/20 11:53 PM Result Value Ref Range  High Sensitivity Troponin T 6 See Comment ng/L D-dimer, quantitative  Collection Time: 11/25/20 11:53 PM Result Value Ref Range  D-Dimer 0.66 (H) <0.50 mg/L FEU CBC auto differential  Collection Time: 11/25/20 11:53 PM Result Value Ref Range  WBC 5.9 4.0 - 11.0 x1000/?L  RBC 4.46 4.00 - 6.00 M/?L  Hemoglobin 13.4 13.2 - 17.1 g/dL  Hematocrit 16.10 96.04 -  50.00 %  MCV 92.4 80.0 - 100.0 fL  MCH 30.0 27.0 - 33.0 pg  MCHC 32.5 31.0 - 36.0 g/dL  RDW-CV 45.4 09.8 - 11.9 %  Platelets 215 150 - 420 x1000/?L  MPV 10.8 8.0 - 12.0 fL  Neutrophils 60.1 39.0 - 72.0 %  Lymphocytes 31.3 17.0 - 50.0 %  Monocytes 6.5 4.0 - 12.0 %  Eosinophils 1.5 0.0 - 5.0 %  Basophil 0.3 0.0 - 1.4 %  Immature Granulocytes 0.3 0.0 - 1.0 %  nRBC 0.0 0.0 - 1.0 %  ANC (Abs Neutrophil Count) 3.52 2.00 - 7.60 x 1000/?L  Absolute Lymphocyte Count 1.84 0.60 - 3.70 x 1000/?L  Monocyte Absolute Count 0.38 0.00 - 1.00 x 1000/?L  Eosinophil Absolute Count 0.09 0.00 - 1.00 x 1000/?L  Basophil Absolute Count 0.02 0.00 - 1.00 x 1000/?L  Absolute Immature Granulocyte Count 0.02 0.00 - 0.30 x 1000/?L Comprehensive metabolic panel  Collection Time: 11/25/20 11:53 PM Result Value Ref Range  Sodium 140 136 - 145 mmol/L  Potassium 3.9 3.5 - 5.1 mmol/L  Chloride 107 98 - 107 mmol/L  CO2 29 21 - 32 mmol/L  Anion Gap 4 (L) 5 - 15 mmol/L  Glucose 243 (H) 65 - 110 mg/dL  BUN 12 7 - 18 mg/dL  Creatinine 1.47 8.29 - 1.30 mg/dL  eGFR (AFRICAN-AMERICAN) >60 >60 mL/min/1.61m2  eGFR (NON African-American) >60 >60 mL/min/1.78m2  Calcium 9.6 8.5 - 10.1 mg/dL  Total Protein 7.8 6.4 - 8.2 g/dL  Albumin 3.6 3.4 - 5.0 g/dL  Globulin 4.2 2.5 - 5.0 g/dL  Total Bilirubin 0.3 <5.6 mg/dL  Alkaline Phosphatase 68 45 - 117 U/L  Alanine Aminotransferase (ALT) 128 (H) 16 - 61 U/L  Aspartate Aminotransferase (AST) 76 (H) 15 - 37 U/L EKG  Collection Time: 11/26/20  3:32 AM Result Value Ref Range  ECG - HEART RATE 55 bpm  ECG - QRS Interval 106 ms  ECG - QT Interval 419 ms  ECG - QTC Interval 401 ms  ECG - P Axis 43 deg  ECG - QRS Axis 29 deg  ECG - T Wave Axis 72 deg  ECG -- P-R Interval 187 msec  ECG - SEVERITY Borderline ECG severity Diagnostics:His chest x-ray shows sternotomy wires and sternal hardware, but there is no acute cardiopulmonary process identified at this time.  His chest [place] angiogram was negative for pulmonary embolism, as interpreted by the radiologist.ECG/Tele Events: His EKG shows normal sinus rhythm with heart rate of 77, there is no acute changes compared to the previous EKG on the 19th August 2019, as interpreted by myself.  Differential Diagnoses:  Acute coronary syndrome, unstable angina, pulmonary embolism, costochondritis, chest wall pain, gastroesophageal reflux disease, erosive esophagitis, esophageal spasm, hiatal hernia, pleurisy, aortic aneurysm dissection, trauma, myocarditis, pericarditis, endocarditis, pneumonia, pneumothorax, anxiety, shingles, biliary or pancreatic pain/colic.Assessment: This is a 45 year old male that is admitted with chest pain rule out myocardial infarction.  His heart score was 3, and had a TIMI score of 5.  He does have known coronary artery disease status post myocardial infarction and coronary artery bypass grafting in both 2007 and 2012.  Troponin x3 and initial EKG were normal.  Continues to chest pain, that is relieved with morphine.  Patient is allergic to nitroglycerin that causes hives.  He does not appear to be infected at this time.  I have low suspicion for COVID-19 virus.  Hemodynamically he is stable, remains afebrile.  Plan: Admit to:  Dr. Mardi Mainland, telemetry bed on  observation status.Diet:  Cardiac diet.Activity:  As tolerated.I have utilized all available immediate resources to obtain, update, or review the patient's current medications.Significant home medications being held at time of admission include:  None.Pharmacy to reconcile home medications in the morning.Problem 1:  Chest pain rule out myocardial infarction-will have continuous telemetry.  I did increase his aspirin from 81 to 325 milligrams by mouth once a day.  Will continue to check EKGs q.6 hours x3.  Will not prescribed nitroglycerin secondary to patient's allergies.  Will give morphine IV as needed for active chest pain.  Echocardiogram and exercise stress test were ordered.  Urine toxicology for drugs of abuse is pending.  Will have cardiology consult with Dr. Fayrene Helper, question need for cardiac catheterization.  Will draw a CBC, BMP, magnesium, phosphorus, and lipid panel in the morning.Problem 2:  Active nicotine abuse-I did prescribe a nicotine patch, 7 milligrams transdermal, which is applied once a day.  Will offer him a smoking cessation consult.Problem 3:  Hypertension-continue metoprolol.  Monitor blood pressures.Problem 4:  Dyslipidemia-his Lipitor change to Crestor.  Will check lipid panel in the morning.DVT prophylaxis:  I did start him on Lovenox 40 milligram subcutaneous once a day.Code status:  His code status is full code, as discussed with the patient the bedside.I confirmed that the patient's Advanced Care Plan is present, code status is documented, or surrogate decision maker is listed in the patient's medical record.I have discussed the patients code status:Yeswith:patientNotifications: PCP: @PCPNOTITLE @ NoneI have personally notified the patient's primary care provider of this admission. No  Family was notified of this admission. NoI have personally discussed the plan with the patient and/or family. YesI have reviewed the plan of care with the bedside nurse, including an emphasis on the most important aspects of care for the next 12 hours: yesSigned:Kavan Devan Deberah Castle, APRN 7/9/20223:37 AMDisclaimer:  This chart was created using M-Modal dictation software. Efforts were made by me to ensure accuracy, however some errors may be present due to limitations of this technology and occasionally words are not transcribed as intended

## 2020-12-04 ENCOUNTER — Encounter (HOSPITAL_BASED_OUTPATIENT_CLINIC_OR_DEPARTMENT_OTHER)

## 2020-12-04 ENCOUNTER — Inpatient Hospital Stay
Admit: 2020-12-04 | Discharge: 2020-12-05 | Disposition: A | Discharge status: Home / Self Care | Payer: MEDICAID | Attending: Emergency Medicine

## 2020-12-04 ENCOUNTER — Encounter

## 2020-12-04 ENCOUNTER — Emergency Department: Admit: 2020-12-04 | Payer: MEDICAID | Primary: Family

## 2020-12-04 ENCOUNTER — Other Ambulatory Visit

## 2020-12-04 DIAGNOSIS — R079 Chest pain, unspecified: Secondary | ICD-10-CM

## 2020-12-04 LAB — CBC WITH DIFFERENTIAL
Basophils %: 0.2 %
Basophils Absolute: 0.01 10*3/uL (ref 0.00–0.22)
Eosinophils %: 2.4 %
Eosinophils Absolute: 0.13 10*3/uL (ref 0.00–0.50)
Hematocrit: 41.9 % (ref 37.0–53.0)
Hemoglobin: 13.7 g/dL (ref 13.0–17.5)
Immature Granulocytes %: 0.6 %
Immature Granulocytes Absolute: 0.03 10*3/uL (ref 0.00–0.10)
Lymphocyte %: 37 %
Lymphocytes Absolute: 2 10*3/uL (ref 0.70–4.00)
MCH: 30.4 pg (ref 26.0–34.0)
MCHC: 32.7 g/dL (ref 31.0–37.0)
MCV: 92.9 fL (ref 80.0–100.0)
MPV: 10.5 fL (ref 9.1–12.4)
Monocytes %: 7.6 %
Monocytes Absolute: 0.41 10*3/uL (ref 0.38–0.83)
NRBC %: 0 % (ref 0.0–0.0)
NRBC Absolute: 0 10*3/uL (ref 0.00–2.00)
Neutrophil %: 52.2 %
Neutrophils Absolute: 2.83 10*3/uL (ref 1.50–7.95)
Platelets: 212 10*3/uL (ref 150–400)
RBC: 4.51 M/uL (ref 4.20–5.90)
RDW-CV: 12.7 % (ref 11.5–14.5)
RDW-SD: 43.3 fL (ref 35.0–51.0)
WBC: 5.4 10*3/uL (ref 4.0–11.0)

## 2020-12-04 LAB — COMPREHENSIVE METABOLIC PANEL
ALT: 111 U/L — ABNORMAL HIGH (ref 0–55)
AST: 65 U/L — ABNORMAL HIGH (ref 6–42)
Albumin: 4 g/dL (ref 3.2–5.0)
Alkaline phosphatase: 76 U/L (ref 30–130)
Anion Gap: 7 mmol/L (ref 3–14)
BUN: 12 mg/dL (ref 6–24)
Bilirubin, total: 0.8 mg/dL (ref 0.2–1.2)
CO2 (Bicarbonate): 30 mmol/L (ref 20–32)
Calcium: 9.9 mg/dL (ref 8.5–10.5)
Chloride: 103 mmol/L (ref 98–110)
Creatinine: 1.03 mg/dL (ref 0.55–1.30)
Glucose: 259 mg/dL — ABNORMAL HIGH (ref 70–139)
Potassium: 4.2 mmol/L (ref 3.6–5.2)
Protein, total: 7.8 g/dL (ref 6.0–8.4)
Sodium: 140 mmol/L (ref 135–146)
eGFRcr: 92 mL/min/{1.73_m2} (ref >=60)

## 2020-12-04 LAB — LIGHT BLUE TOP

## 2020-12-04 LAB — TROPONIN I
Troponin I: <0.01 ng/mL (ref <0.03)
Troponin I: <0.01 ng/mL (ref <0.03)

## 2020-12-04 MED ORDER — ondansetron (Zofran) injection 4 mg
4 | Freq: Once | INTRAMUSCULAR | Status: AC
Start: 2020-12-04 — End: 2020-12-04
  Administered 2020-12-04: 23:00:00 4 mg via INTRAVENOUS

## 2020-12-04 MED ORDER — alum-mag hydroxide-simeth (Maalox MAX) 400-400-40 mg/5 mL suspension 30 mL
400-400-40 | Freq: Once | ORAL | Status: AC
Start: 2020-12-04 — End: 2020-12-04
  Administered 2020-12-05: 01:00:00 30 mL via ORAL

## 2020-12-04 MED ORDER — morphine injection 2 mg
2 | Freq: Once | INTRAMUSCULAR | Status: AC
Start: 2020-12-04 — End: 2020-12-04
  Administered 2020-12-04: 23:00:00 2 mg via INTRAVENOUS

## 2020-12-04 MED ORDER — alum-mag hydroxide-simeth (Maalox MAX) 400-400-40 mg/5 mL suspension 10 mL
400-400-40 | Freq: Once | ORAL | Status: DC
Start: 2020-12-04 — End: 2020-12-04

## 2020-12-04 MED ORDER — lidocaine (Xylocaine) 2 % mouth solution 15 mL
2 | Freq: Once | Status: AC
Start: 2020-12-04 — End: 2020-12-04
  Administered 2020-12-05: 01:00:00 15 mL via OROMUCOSAL

## 2020-12-04 MED ORDER — morphine injection 2 mg
2 | Freq: Once | INTRAMUSCULAR | Status: AC
Start: 2020-12-04 — End: 2020-12-04
  Administered 2020-12-05: 01:00:00 2 mg via INTRAVENOUS

## 2020-12-04 MED FILL — MORPHINE 2 MG/ML INJECTION WRAPPER: 2 2 mg/mL | INTRAMUSCULAR | Qty: 1

## 2020-12-04 MED FILL — ONDANSETRON HCL (PF) 4 MG/2 ML INJECTION SOLUTION: 4 4 mg/2 mL | INTRAMUSCULAR | Qty: 2

## 2020-12-04 NOTE — Discharge Instructions (Signed)
 Please call your doctor tomorrow    Please call for an appointment in the Cardiology Clinic    Return quickly if your symptoms worsen

## 2020-12-04 NOTE — ED Notes (Signed)
 Pt with sudden onset left sided CP while sitting still approx 45 mins pta. States pain is worse on exertion. Radiating down left arm. Hx 3 heart attacks, most recent in 2019. Hx CABG. Pt is cardiac followed here at [facility]. Denies dizziness, SOB. Pt is calm and cooperative, alert and oriented. Vital signs stable. Cardiac monitor in place. Pt denies any needs or complaints at this time. Pt does not appear in acute distress. Will continue to monitor.     Thornell Sartorius, RN  12/04/20 [phone]

## 2020-12-04 NOTE — ED Notes (Signed)
 Pt waiting for lab results at this time. Pt is calm and cooperative, alert and oriented. Pt revitalized and vital signs stable. Cardiac monitor in place. Pt denies any needs or complaints at this time. Pt does not appear in acute distress. Will continue to monitor.     Thornell Sartorius, RN  12/04/20 [phone]

## 2020-12-04 NOTE — ED Provider Notes (Signed)
 EMERGENCY DEPARTMENT ENCOUNTER    ATTENDING NOTE      Patient seen by the advanced practice provider under my supervision. I agree with the assessment and evaluation/work up. I personally saw the patient and performed a substantial portion of the visit including all aspects of the medical decision making.      Date of service: 12/04/2020  5:49 PM    Chief Complaint   Patient presents with   . Chest Pain       Nursing notes reviewed and agreed with unless modified below.    HISTORY OF PRESENT ILLNESS:    [name] is a 45 y.o. male who has a history of CAD with CABG x1 [RIMI-RCA, 2007) c/b graft atresia requiring re-implantation 2012, self-reported history of MI x3 [2007, 2012, 2013], diabetes type 2, hypertension, hyperlipidemia, chronic chest pain who presents to the ED with chest pain that began today at rest.  Patient describes several hours ago developing chest pain left-sided radiating to his left shoulder and to his neck, moderate intensity, pressure type.  He reports a history of allergies to nitroglycerin, NSAIDs, codeine but morphine works for this pain.  He reports history of rash and syncope when he gets nitroglycerin.  He denies lower extremity swelling.  He states he does not have a cardiologist.  He was admitted to this hospital in June 06, 2020 where he refused additional cardiac biomarkers and apparently left angry at staff, apparently blamed them when he tested positive for COVID-19.    REVIEW OF SYSTEMS    Review of Systems   Constitutional: Negative for fever.   HENT: Negative for sore throat.    Eyes: Negative for redness.   Respiratory: Positive for shortness of breath. Negative for cough and wheezing.    Cardiovascular: Positive for chest pain and leg swelling. Negative for orthopnea and claudication.   Gastrointestinal: Negative for abdominal pain, blood in stool and vomiting.   Genitourinary: Negative for dysuria.   Musculoskeletal: Negative for back pain.   Skin: Negative for  rash.   Neurological: Negative for headaches.      PAST MEDICAL HISTORY / PROBLEM LIST    Past Medical History:   Diagnosis Date   . Heart attack (CMS/HCC)     x3   . Hypertension     pt denies having high BP       There is no problem list on file for this patient.    PAST SURGICAL HISTORY    Past Surgical History:   Procedure Laterality Date   . ARTERIAL BYPASS SURGERY      x2       MEDICATIONS     Discharge Medication List as of 12/04/2020 11:32 PM      CONTINUE these medications which have NOT CHANGED    Details   amoxicillin (Amoxil) 500 mg capsule TAKE 1 CAPSULE BY MOUTH EVERY 8 HOURS FOR 5 DAYS, Starting Wed 06/29/2020, Until Thu 06/29/2021 at 2359, Normal      aspirin 81 mg capsule Take 81 mg by mouth in the morning., Starting Sun 01/05/2018, Historical Med      atorvastatin (Lipitor) 10 mg tablet Take 40 mg by mouth in the morning., Starting Sun 01/05/2018, Historical Med      CLINDAMYCIN HCL ORAL Take by mouth., Historical Med      metoprolol tartrate (Lopressor) 25 mg tablet Take 25 mg by mouth in the morning and 25 mg in the evening., Starting Mon 03/11/2017, Historical Med  ALLERGIES    Allergies   Allergen Reactions   . Ibuprofen Anaphylaxis, Hives, Itching and Rash     Other reaction(s): Anaphylactic reaction, Unknown, Urticaria  anaphylaxis  anaphylaxis     . Ketorolac Anaphylaxis, Hives and Rash     Other reaction(s): ITCHY HIVES, Unknown, Urticaria  Converted from Generic Allergy: Ketorolac  Anaphylaxis   Anaphylaxis   Tolerates ASA     . Tramadol Anaphylaxis, Angioedema, Hives and Rash     Other reaction(s): THROAT CLOSES, Unknown, Urticaria  Patient confirmed that he can take Percocet, Hydromorphone and Morphine. [name], Pharmacy  anaphylaxis  anaphylaxis       SOCIAL HISTORY    Social History     Tobacco Use   . Smoking status: Current Every Day Smoker     Types: Cigarettes   . Smokeless tobacco: Never Used   Substance Use Topics   . Alcohol use: Never     Social History     Substance  and Sexual Activity   Drug Use Never     FAMILY HISTORY    No family history on file.    PHYSICAL EXAM    ED Triage Vitals   Temp Pulse Resp BP   12/04/20 1747 12/04/20 1747 12/04/20 1747 12/04/20 1747   36.8 C (98.2 F) 83 18 125/85      SpO2 Temp src Heart Rate Source Patient Position   12/04/20 1747 -- 12/04/20 1800 12/04/20 1800   97 %  Monitor Lying      BP Location FiO2 (%)     12/04/20 1800 --     Left arm         General: Alert, without distress, mentation appropriate, vital signs within normal limits.  Cooperative.  Head: Atraumatic  Eyes: PERRL, clear conjunctivae.  ENT: Moist oral mucosa. Normal posterior OP. Neck: no MLT, supple, trachea midline, no cervical LAD.   Respiratory: No respiratory distress, clear lungs  CVS: RRR, normal S1 and S2, no murmurs, no chest wall tenderness.  Well-healed sternotomy scar.  Abdomen: Non-distended, +BS. Soft, non-tender.  Back: No MLT, no CVAT.  Extremities: LE symmetric, no edema.  Skin: Normal temperature, brisk capillary refill, no rash.  Neuro: Alert and oriented x3, no focal deficits    EKG    EKG interpreted by me and shows:    Normal sinus rhythm at a rate of 81 with normal axis and intervals, QTC 425, no acute ST deviation or T wave inversions.  EKG compared and unchanged from June 06, 2020.    Repeat EKG 9:13 PM: No interval change, NSR at 66, no acute ST changes or T wave inversions.    Cardiac Monitor    Rhythm strip interpreted by me and shows:    Normal sinus rhythm at a rate of 70    LABS    Labs Reviewed   COMPREHENSIVE METABOLIC PANEL - Abnormal       Result Value    Sodium 140      Potassium 4.2      Chloride 103      CO2 (Bicarbonate) 30      Anion Gap 7      BUN 12      Creatinine 1.03      eGFRcr 92      Glucose 259 (*)     Fasting? No      Calcium 9.9      AST 65 (*)     ALT 111 (*)  Alkaline phosphatase 76      Protein, total 7.8      Albumin 4.0      Bilirubin, total 0.8     TROPONIN I - Normal    Troponin I <0.01     TROPONIN I - Normal     Troponin I <0.01     CBC W/DIFF    Narrative:     The following orders were created for panel order CBC and differential.  Procedure                               Abnormality         Status                     ---------                               -----------         ------                     CBC w/ Differential[[id number]]                               Final result                 Please view results for these tests on the individual orders.   CBC WITH DIFFERENTIAL - WAM AND NON-WAM    WBC 5.4      RBC 4.51      Hemoglobin 13.7      Hematocrit 41.9      MCV 92.9      MCH 30.4      MCHC 32.7      RDW-CV 12.7      RDW-SD 43.3      Platelets 212      MPV 10.5      Neutrophil % 52.2      Lymphocyte % 37.0      Monocytes % 7.6      Eosinophils % 2.4      Basophils % 0.2      Immature Granulocytes % 0.6      NRBC % 0.0      Neutrophils Absolute 2.83      Lymphocytes Absolute 2.00      Monocytes Absolute 0.41      Eosinophils Absolute 0.13      Basophils Absolute 0.01      Immature Granulocytes Absolute 0.03      NRBC Absolute 0.00          RADIOLOGY    X-rays contemporaneously visualized and interpreted by me:    Chest x-ray with no large pleural effusion or pneumothorax or significant pulmonary edema.  Sternotomy wires in place    XR CHEST 2 VIEWS   Preliminary Result   FINDINGS/IMPRESSION:   Lines/Tubes/Other: Median sternotomy wires and clips, overall appears unchanged compared to 06/05/2020 with few fractured mediastinal wires. Mid sternal closure device.      Heart/Mediastinum: Cardiomediastinal silhouette is enlarged.      Lungs/Pleura: Lungs volumes are normal. Hazy opacity over the right lung base which may represent subsegmental atelectasis, although superimposed infectious process cannot be excluded. Otherwise, no dense confluent pulmonary opacities. No effusions or pneumothorax.      Bones: There are no acute bony  abnormalities.           MEDICAL DECISION MAKING & ED COURSE    Pertinent labs and imaging  studies reviewed.    45 year old male with CAD with prior CABG, does not have a cardiologist, admitted in January 2022 and left AGAINST MEDICAL ADVICE it seems, apparently angry with admitting team.  He reports an unusual history of allergies to nitroglycerin and NSAIDs and can only take morphine for his pain.  Etiology unclear on arrival and the patient had a normal EKG.  He did receive morphine 2 mg and Zofran 4 mg IV.  He is currently sleeping.  Will reassess, anticipate second troponin and if clinical picture is not consistent with ACS may have patient discharged with outpatient follow-up.  I will likely offer him admission to the hospital for observation without opioids for pain and see if the patient is willing to accept this if he has concerns for his chest pain.    ED Course as of 12/05/20 0029   Paulene Boron Dec 04, 2020   2015 Patient currently sleeping.  He received morphine 2 mg IV and Zofran 4 mg IV.  No active chest pain.  His troponin is nonelevated.  His EKG had no acute ischemic changes. [JR]   Mon Dec 05, 2020   0028 I returned to the patient after his second cardiac enzyme was negative and he was sleeping.  I woke him up and he stated he still had chest pain.  I offered him admission to the hospital but stated it was unlikely that we would treat this pain with morphine and would consider other options if he has concerns for cardiac ischemia.  He ultimately preferred to go home. [JR]      ED Course User Index  [JR] Jonn Nett, MD         Diagnoses as of 12/05/20 0029   Chest pain, unspecified type     CLINICAL IMPRESSION:    1. Chest pain, unspecified type        FINAL DISPOSITION: Discharged         Jonn Nett, MD  12/05/20 [phone]

## 2020-12-04 NOTE — ED Provider Notes (Signed)
 HPI   Chief Complaint   Patient presents with   ? 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 Heart attack (CMS/HCC)     x3   ? Hypertension     pt denies having high BP     Past Surgical History:   Procedure Laterality Date   ? ARTERIAL BYPASS SURGERY      x2     No family history on file.  Social History     Tobacco Use   ? Smoking status: Current Every Day Smoker     Types: Cigarettes   ? Smokeless tobacco: Never Used   Vaping Use   ? Vaping Use: Not on file   Substance Use Topics   ? Alcohol use: Never   ? Drug use: Never       Review of Systems   Review of Systems    Physical Exam   ED Triage Vitals   Temp Pulse Resp BP   12/04/20 1747 12/04/20 1747 12/04/20 1747 12/04/20 1747   36.8 ?C (98.2 ?F) 83 18 125/85      SpO2 Temp src Heart Rate Source Patient Position   12/04/20 1747 -- 12/04/20 1800 12/04/20 1800   97 %  Monitor Lying      BP Location FiO2 (%)     12/04/20 1800 --     Left arm        Physical Exam  No orders to display       Labs Reviewed   CBC W/DIFF    Narrative:     The following orders were created for panel order CBC and differential.  Procedure                               Abnormality         Status                     ---------                               -----------         ------                     CBC w/ Differential[[id number]]                               Final result                  Please view results for these tests on the individual orders.  CBC WITH DIFFERENTIAL - WAM AND NON-WAM       Result Value    WBC 5.4      RBC 4.51      Hemoglobin 13.7      Hematocrit 41.9      MCV 92.9      MCH 30.4      MCHC 32.7      RDW-CV 12.7      RDW-SD 43.3      Platelets 212      MPV 10.5      Neutrophil % 52.2      Lymphocyte % 37.0      Monocytes % 7.6      Eosinophils % 2.4      Basophils % 0.2      Immature Granulocytes % 0.6      NRBC % 0.0      Neutrophils Absolute 2.83      Lymphocytes Absolute 2.00      Monocytes Absolute 0.41      Eosinophils Absolute 0.13      Basophils Absolute 0.01      Immature Granulocytes Absolute 0.03      NRBC Absolute 0.00     COMPREHENSIVE METABOLIC PANEL   TROPONIN I       ED Course & MDM   ED Course as of 12/05/20 0029   Sun Dec 04, 2020   2015 Patient currently sleeping.  He received morphine 2 mg IV and Zofran 4 mg IV.  No active chest pain.  His troponin is nonelevated.  His EKG had no acute ischemic changes. [JR]   Mon Dec 05, 2020   0028 I returned to the patient after his second cardiac enzyme was negative and he was sleeping.  I woke him up and he stated he still had chest pain.  I offered him admission to the hospital but stated it was unlikely that we would treat this pain with morphine and would consider other options if he has concerns for cardiac ischemia.  He ultimately preferred to go home. [JR]      ED Course User Index  [JR] Norva Riffle, MD         Diagnoses as of 12/05/20 0029   Chest pain, unspecified type       MDM       Winona Legato, PA  12/04/20 1827       Demetrius Revel [place], [place]  12/04/20 1844       Norva Riffle, MD  12/05/20 0030

## 2020-12-04 NOTE — Other (Signed)
 Patient Education   Table of Contents       Nonspecific Chest Pain, Adult     To view videos and all your education online visit,   [email or web address]   or scan this QR code with your smartphone.                    Nonspecific Chest Pain, Adult     Chest pain is an uncomfortable, tight, or painful feeling in the chest. The pain can feel like a crushing, aching, or squeezing pressure. A person can feel a burning or tingling sensation. Chest pain can also be felt in your back, neck, jaw, shoulder, or arm. This pain can be worse when you move, sneeze, or take a deep breath.   Chest pain can be caused by a condition that is life-threatening. This must be treated right away. It can also be caused by something that is not life-threatening. If you have chest pain, it can be hard to know the difference, so it is important to get help right away to make sure that you do not have a serious condition.    Some life-threatening causes of chest pain include:       Heart attack.       A tear in the body's main blood vessel (aortic dissection).       Inflammation around your heart (pericarditis).       A problem in the lungs, such as a blood clot (pulmonary embolism) or a collapsed lung (pneumothorax).      Some non life-threatening causes of chest pain include:       Heartburn.       Anxiety or stress.       Damage to the bones, muscles, and cartilage that make up your chest wall.       Pneumonia or bronchitis.       Shingles infection (varicella-zoster virus).     Your chest pain may come and go. It may also be constant. Your health care provider will do tests and other studies to find the cause of your pain. Treatment will depend on the cause of your chest pain.   Follow these instructions at home:   Medicines         Take over-the-counter and prescription medicines only as told by your health care provider.       If you were prescribed an antibiotic medicine, take it as told by your health care provider. Do not   stop taking the antibiotic even if you start to feel better.     Activity         Avoid any activities that cause chest pain.      Do not  lift anything that is heavier than 10 lb (4.5 kg), or the limit that you are told, until your health care provider says that it is safe.       Rest as directed by your health care provider.       Return to your normal activities only as told by your health care provider. Ask your health care provider what activities are safe for you.     Lifestyle              Do not  use any products that contain nicotine or tobacco, such as cigarettes, e-cigarettes, and chewing tobacco. If you need help quitting, ask your health care provider.      Do not  drink alcohol.      Make  healthy lifestyle changes as recommended. These may include:       Getting regular exercise. Ask your health care provider to suggest some exercises that are safe for you.       Eating a heart-healthy diet. This includes plenty of fresh fruits and vegetables, whole grains, low-fat (lean) protein, and low-fat dairy products. A dietitian can help you find healthy eating options.       Maintaining a healthy weight.       Managing any other health conditions you may have, such as high blood pressure (hypertension) or diabetes.       Reducing stress, such as with yoga or relaxation techniques.       General instructions         Pay attention to any changes in your symptoms.       It is up to you to get the results of any tests that were done. Ask your health care provider, or the department that is doing the tests, when your results will be ready.       Keep all follow-up visits as told by your health care provider. This is important.       You may be asked to go for further testing if your chest pain does not go away.       Contact a health care provider if:         Your chest pain does not go away.       You feel depressed.       You have a fever.       You notice changes in your symptoms or develop new symptoms.     Get  help right away if:         Your chest pain gets worse.       You have a cough that gets worse, or you cough up blood.       You have severe pain in your abdomen.       You faint.       You have sudden, unexplained chest discomfort.       You have sudden, unexplained discomfort in your arms, back, neck, or jaw.       You have shortness of breath at any time.       You suddenly start to sweat, or your skin gets clammy.       You feel nausea or you vomit.       You suddenly feel lightheaded or dizzy.       You have severe weakness, or unexplained weakness or fatigue.       Your heart begins to beat quickly, or it feels like it is skipping beats.     These symptoms may represent a serious problem that is an emergency. Do not wait to see if the symptoms will go away. Get medical help right away. Call your local emergency services (911 in the [place] Do not drive yourself to the hospital.   Summary         Chest pain can be caused by a condition that is serious and requires urgent treatment. It may also be caused by something that is not life-threatening.       Your health care provider may do lab tests and other studies to find the cause of your pain.       Follow your health care provider's instructions on taking medicines, making lifestyle changes, and getting emergency treatment if symptoms become worse.  Keep all follow-up visits as told by your health care provider. This includes visits for any further testing if your chest pain does not go away.     This information is not intended to replace advice given to you by your health care provider. Make sure you discuss any questions you have with your health care provider.     Document Released: 09/27/2006Document Revised: 03/03/2022Document Reviewed: 07/21/2020     Elsevier Patient Education ? 2022 [employer]

## 2020-12-04 NOTE — ED Notes (Signed)
 Pt ambulated to bathroom with steady gait      Thornton Dales, RN  12/04/20 2153

## 2020-12-04 NOTE — ED Triage Notes (Signed)
 Chest pain for 45 minutes, radiating to left arm. Significant cardiac Hx.  Awake alert. Pain worse with exertion.

## 2020-12-05 LAB — BLOOD BANK HOLD TUBE

## 2020-12-05 MED FILL — MORPHINE 2 MG/ML INJECTION WRAPPER: 2 2 mg/mL | INTRAMUSCULAR | Qty: 1

## 2020-12-05 MED FILL — ALUMINUM-MAG HYDROXIDE-SIMETHICONE 400 MG-400 MG-40 MG/5 ML ORAL SUSP: 400-400-40 400-400-40 mg/5 mL | ORAL | Qty: 30

## 2020-12-05 MED FILL — LIDOCAINE HCL 2 % MUCOSAL SOLUTION: 2 2 % | Qty: 15

## 2020-12-05 NOTE — Discharge Summary
[facility] Discharge SummaryPatient Data:  Patient Name: Ricardo Ramirez Age: 45 y.o. DOB: 1975/09/22	 MRN: [id number]	 Admit date: 7/8/2022Discharge date: 7/9/22Discharge Attending Physician: No att. providers found  PCP: Referring, NoPrincipal Diagnosis: Chest pain, unspecified typeOther Diagnosis: 1. CAD status post MI2. Hypertension3. HyperlipidemiaAllergies Allergies Allergen Reactions ? Coconut Hives and Throat Closes ? Motrin [Ibuprofen]  ? Nitroglycerin  ? Toradol [Ketorolac]  ? Tramadol    Tolerated IV morphine 01/06/18   Hospital Course:  Patient is a 45 year old gentleman with history of CAD status post CABG, hypertension, hyperlipidemia, chronic pain, who presented with chest pain.  His pain was felt to be atypical.  He was seen in consultation by Cardiology who recommend avoiding narcotics.Patient's stress test was nondiagnostic due to inability to reach target heart rate hip patient refused chemical stress test.  He declined imaging after the stress part of the test.  He requested pain management.Patient continued to have chest pain and requested continuing morphine.He was given a dose of morphine early.  No acute changes on EKG.Patient then apparently stated he would leave AMA if he can knock it Percocet; left AMA without signing AMA form stating I do not sign forms.Pertinent lab findings and test results: CBCNo results for input(s): WBC, HGB, HCT, PLT in the last 168 hours.LFTsNo results for input(s): ALT, AST, ALKPHOS, BILITOT, BILIDIR in the last 168 hours.LIPASENo results for input(s): LIPASE in the last 168 hours. BMPNo results for input(s): NA, K, CL, CO2, BUN, CREATININE, CALCIUM, MG, PHOS in the last 168 hours.CoagsNo results for input(s): INR, PTT in the last 168 hours. Cardiac EnzymesNo results for input(s): TROPONINI, CKTOTAL, CKMB in the last 168 hours.Invalid input(s): TROPONINPOC pBNPNo components found for: PROBNP LipidsLab Results Component Value Date  CHOL 139 11/26/2020  HDL 33 (L) 11/26/2020  LDL 82 11/26/2020  TRIG 137 11/26/2020  A1CNo results found for: HGBA1C Other Labs No results for input(s): TSH in the last 168 hours.  Discharge vitals: Blood pressure 130/63, pulse (!) 51, temperature 98.2 ?F (36.8 ?C), temperature source Oral, resp. rate 18, height 6' 4 (1.93 m), weight 121.9 kg, SpO2 97 %.Pending Labs and Tests: Discharge Instructions: 1. Continue usual medications of follow-up with Cardiology2. Return to hospital for concerning symptoms/worsening painDischarged Condition: fairDisposition: AMACode Status: PriorFollow-up Information:No follow-up provider specified. PMH PSH Past Medical History: Diagnosis Date ? Chronic coronary artery disease  ? History of single vessel coronary artery bypass  ? Hypercholesteremia  ? Hypertension  ? MI (myocardial infarction) (HC Code) (HC CODE) (HC Code)   Past Surgical History: Procedure Laterality Date ? CARDIAC SURGERY    Social History Family History Social History Tobacco Use ? Smoking status: Current Every Day Smoker ? Smokeless tobacco: Not on file Substance Use Topics ? Alcohol use: Never  History reviewed. No pertinent family history. Spent total of *25 minutes on exam, discussion of stay, coordinating care, and filling of prescriptions.Electronically Signed:Elmor Kost Loreen Freud, MD 12/05/2020 1:31 PMBest Contact Information: Beeper: [phone] : NONE

## 2020-12-08 LAB — COVID-19 ANTIGEN CARE EVERYWHERE: COVID-19 ANTIGEN CARE EVERWHERE: NEGATIVE

## 2020-12-14 ENCOUNTER — Encounter

## 2021-02-21 LAB — COVID-19 CARE EVERYWHERE: COVID-19 CARE EVERYWHERE: NOT DETECTED

## 2021-02-22 LAB — UNMAPPED LAB RESULTS: Glucose (EXT): 416 mg/dL — AB (ref 70–100)

## 2021-02-22 LAB — HEMOGLOBIN A1C CARE EVERYWHERE
ESTIMATED AVERAGE GLUCOSE CARE EVERYWHERE: 404 mg/dL
HEMOGLOBIN A1C CARE EVERYWHERE: 15.7 % — ABNORMAL HIGH (ref 4.6–5.6)

## 2021-02-28 LAB — LIPID PROFILE (EXT)
Cholesterol (EXT): 184 mg/dL (ref 112–199)
HDL Cholesterol (EXT): 33 mg/dL — AB (ref 40–100)
LDL Cholesterol, CALC (EXT): 97 mg/dL (ref 0–99)
NON HDL Cholesterol (EXT): 151 mg/dL — ABNORMAL HIGH (ref 0.0–129.0)
Triglycerides (EXT): 271 mg/dL — AB

## 2021-02-28 LAB — HEMOGLOBIN A1C
Estimated Average Glucose mg/dL (INT/EXT): 364 mg/dL — ABNORMAL HIGH (ref 82–117)
HEMOGLOBIN A1C % (INT/EXT): 14.3 % — ABNORMAL HIGH (ref 4.5–5.7)

## 2021-02-28 LAB — HEMOGLOBIN A1C CARE EVERYWHERE
HEMOGLOBIN A1C CARE EVERYWHERE: 14.3 % — ABNORMAL HIGH (ref 4.5–5.7)
MEAN PLASMA GLUCOSE CARE EVERYWHERE: 364 mg/dL — ABNORMAL HIGH (ref 82–117)

## 2021-03-27 ENCOUNTER — Encounter

## 2021-03-27 ENCOUNTER — Other Ambulatory Visit

## 2021-03-27 DIAGNOSIS — R079 Chest pain, unspecified: Secondary | ICD-10-CM

## 2021-03-27 LAB — COMPREHENSIVE METABOLIC PANEL
ALT: 107 U/L — ABNORMAL HIGH (ref 0–55)
AST: 69 U/L — ABNORMAL HIGH (ref 6–42)
Albumin: 3.4 g/dL (ref 3.2–5.0)
Alkaline phosphatase: 69 U/L (ref 30–130)
Anion Gap: 6 mmol/L (ref 3–14)
BUN: 9 mg/dL (ref 6–24)
Bilirubin, total: 0.5 mg/dL (ref 0.2–1.2)
CO2 (Bicarbonate): 27 mmol/L (ref 20–32)
Calcium: 9.1 mg/dL (ref 8.5–10.5)
Chloride: 107 mmol/L (ref 98–110)
Creatinine: 0.83 mg/dL (ref 0.55–1.30)
Glucose: 250 mg/dL — ABNORMAL HIGH (ref 70–139)
Potassium: 3.3 mmol/L — ABNORMAL LOW (ref 3.6–5.2)
Protein, total: 6.8 g/dL (ref 6.0–8.4)
Sodium: 140 mmol/L (ref 135–146)
eGFRcr: 111 mL/min/{1.73_m2} (ref >=60)

## 2021-03-27 LAB — CBC WITH DIFFERENTIAL
Basophils %: 0.5 %
Basophils Absolute: 0.03 10*3/uL (ref 0.00–0.22)
Eosinophils %: 1.2 %
Eosinophils Absolute: 0.07 10*3/uL (ref 0.00–0.50)
Hematocrit: 40.1 % (ref 37.0–53.0)
Hemoglobin: 13.7 g/dL (ref 13.0–17.5)
Immature Granulocytes %: 0.5 %
Immature Granulocytes Absolute: 0.03 10*3/uL (ref 0.00–0.10)
Lymphocyte %: 32.8 %
Lymphocytes Absolute: 1.9 10*3/uL (ref 0.70–4.00)
MCH: 30.8 pg (ref 26.0–34.0)
MCHC: 34.2 g/dL (ref 31.0–37.0)
MCV: 90.1 fL (ref 80.0–100.0)
MPV: 10.8 fL (ref 9.1–12.4)
Monocytes %: 6.7 %
Monocytes Absolute: 0.39 10*3/uL (ref 0.38–0.83)
NRBC %: 0 % (ref 0.0–0.0)
NRBC Absolute: 0 10*3/uL (ref 0.00–2.00)
Neutrophil %: 58.3 %
Neutrophils Absolute: 3.37 10*3/uL (ref 1.50–7.95)
Platelets: 198 10*3/uL (ref 150–400)
RBC: 4.45 M/uL (ref 4.20–5.90)
RDW-CV: 12.6 % (ref 11.5–14.5)
RDW-SD: 41.5 fL (ref 35.0–51.0)
WBC: 5.8 10*3/uL (ref 4.0–11.0)

## 2021-03-27 LAB — TROPONIN I, HIGH SENSITIVITY: Troponin I, High Sensitivity: 39 ng/L (ref <=79)

## 2021-03-27 LAB — LIGHT BLUE TOP

## 2021-03-27 MED ORDER — sodium chloride 0.9 % flush 3 mL
INTRAMUSCULAR | Status: DC | PRN
Start: 2021-03-27 — End: 2021-03-28

## 2021-03-27 NOTE — ED Notes (Signed)
 Patient reports having CP that developed 1hr ago. Radiates to jaw and left arm. Extensive cardiac history. On cardiac monitor. Call light within reach. Reports taking ASA on arrival to hospital.      Shauna Hugh, RN  03/27/21 2250

## 2021-03-27 NOTE — ED Triage Notes (Signed)
 Per EMS pt c/o CP with cardiac Hx. States squeezing substernal pain rad to L arm. 324 ASA PTA

## 2021-03-27 NOTE — ED Provider Notes (Signed)
 HPI   Chief Complaint   Patient presents with   . Chest Pain       45 year old male with history of 3 prior myocardial infarctions status post CABG at [facility] with history also dyslipidemia and hypertension presents with squeezing severe 9/10 constant left chest pressure radiating to the throat and down the left arm that started at rest about 45 minutes prior to arrival.  Feels like prior myocardial infarction's.  He is denying dyspnea, pleurisy, nausea, fever, cough or cold symptoms.  Nothing makes it better or worse.  Received 4 baby aspirin on route with EMS.  Reports allergy to nitroglycerin causing him to "pass out".    Social history negative for alcohol tobacco and illicit substances                    Glasgow Coma Scale Score: 15                                  Patient History   Past Medical History:   Diagnosis Date   . Heart attack (CMS/HCC)     x3   . Hypertension     pt denies having high BP     Past Surgical History:   Procedure Laterality Date   . ARTERIAL BYPASS SURGERY      x2     No family history on file.  Social History     Tobacco Use   . Smoking status: Every Day     Types: Cigarettes   . Smokeless tobacco: Never   Vaping Use   . Vaping Use: Not on file   Substance Use Topics   . Alcohol use: Never   . Drug use: Never       Review of Systems   Review of Systems   All other systems reviewed and are negative.      Physical Exam   ED Triage Vitals [03/27/21 2121]   Temp Pulse Resp BP   (!) 35.9 C (96.7 F) 69 18 (!) 151/80      SpO2 Temp Source Heart Rate Source Patient Position   96 % Temporal Radial Sitting      BP Location FiO2 (%)     Left arm --       Physical Exam  Vitals and nursing note reviewed.   Constitutional:       General: He is not in acute distress.     Appearance: He is well-developed. He is not ill-appearing, toxic-appearing or diaphoretic.   HENT:      Head: Normocephalic and atraumatic.   Eyes:      Conjunctiva/sclera: Conjunctivae normal.   Neck:      Vascular:  No JVD.   Cardiovascular:      Rate and Rhythm: Normal rate and regular rhythm.      Heart sounds: Normal heart sounds. No murmur heard.    No friction rub. No gallop. No S3 or S4 sounds.   Pulmonary:      Effort: Pulmonary effort is normal. No respiratory distress.      Breath sounds: Normal breath sounds.   Chest:      Chest wall: No tenderness.   Abdominal:      General: There is no abdominal bruit.      Palpations: Abdomen is soft.      Tenderness: There is no abdominal tenderness.   Musculoskeletal:  General: No swelling.      Cervical back: Neck supple.      Right lower leg: No tenderness. No edema.      Left lower leg: No tenderness. No edema.   Skin:     General: Skin is warm and dry.      Capillary Refill: Capillary refill takes less than 2 seconds.   Neurological:      General: No focal deficit present.      Mental Status: He is alert and oriented to person, place, and time.   Psychiatric:         Mood and Affect: Mood normal.       XR CHEST 2 VIEWS   Final Result      No radiographically apparent acute cardiopulmonary process.      Windy Matich 03/27/2021 10:07 PM          Labs Reviewed   COMPREHENSIVE METABOLIC PANEL - Abnormal       Result Value    Sodium 140      Potassium 3.3 (*)     Chloride 107      CO2 (Bicarbonate) 27      Anion Gap 6      BUN 9      Creatinine 0.83      eGFRcr 111      Glucose 250 (*)     Fasting? Unknown      Calcium 9.1      AST 69 (*)     ALT 107 (*)     Alkaline phosphatase 69      Protein, total 6.8      Albumin 3.4      Bilirubin, total 0.5     TROPONIN I, HIGH SENSITIVITY - Normal    Troponin I, High Sensitivity 39      Narrative:     Clinical correlation, HEART Score and shared decision making must be taken into account.                                      For Chest Pain >3 Hours                     Rule-Out Criteria    Single Value         0hr/1hr Delta Value  Male  <10*          <54 AND delta <15  Male    <10*          <79 AND delta <15                       Cannot Rule-Out                    0hr/1hr Delta Value  Male    <54 AND delta >15    >/= 54 AND delta </=15  Male      <79 AND delta >15    >/= 79 and delta </= 15                      Rule-In Criteria      0hr/1hr Delta Value               Any Single Value  Male   >/=54 AND delta >/=15       >101  Male     >/=  79 AND delta >/=15       >115    *Note: for Chest pain <3 hours 0hr/1hr is warranted for evaluation.       CBC W/DIFF    Narrative:     The following orders were created for panel order CBC and differential.  Procedure                               Abnormality         Status                     ---------                               -----------         ------                     CBC w/ Differential[[id number]]                               Final result                 Please view results for these tests on the individual orders.   CBC WITH DIFFERENTIAL - WAM AND NON-WAM    WBC 5.8      RBC 4.45      Hemoglobin 13.7      Hematocrit 40.1      MCV 90.1      MCH 30.8      MCHC 34.2      RDW-CV 12.6      RDW-SD 41.5      Platelets 198      MPV 10.8      Neutrophil % 58.3      Lymphocyte % 32.8      Monocytes % 6.7      Eosinophils % 1.2      Basophils % 0.5      Immature Granulocytes % 0.5      NRBC % 0.0      Neutrophils Absolute 3.37      Lymphocytes Absolute 1.90      Monocytes Absolute 0.39      Eosinophils Absolute 0.07      Basophils Absolute 0.03      Immature Granulocytes Absolute 0.03      NRBC Absolute 0.00         ED Course & MDM  45 year old male with history of 3 prior myocardial infarctions status post CABG at [facility] with history also dyslipidemia and hypertension presents with squeezing severe 9/10 constant left chest pressure radiating to the throat and down the left arm that started at rest about 45 minutes prior to arrival.  Feels like prior myocardial infarction's.  He is denying dyspnea, pleurisy, nausea, fever, cough or cold symptoms.  Nothing makes it better or worse.   Received 4 baby aspirin on route with EMS.  Reports allergy to nitroglycerin causing him to "pass out".    On exam he does not have any reproducible chest pain on palpation or with inspiration.  His EKG shows normal sinus rhythm with a rate of 70 PR 176 QRS 98 QTC 421 axis 95 and no acute ST or T wave abnormalities there is but EKG is borderline with right axis deviation and possible  left atrial enlargement.  Previously he had a first-degree AV block on descriptive notable EKG not present on this EKG.    Chest x-ray unremarkable.  First troponin normal.  Potassium is 3.3 and this will be supplemented with 40 mill equivalents of oral potassium chloride while he gets 4 mg IV morphine and will be reassessed for improvement.    Will discuss with hospitalist for telemetry observation admission.       MDM       Annie Sable, MD  03/28/21 0111

## 2021-03-27 NOTE — ED Notes (Signed)
 Placed on cardiac monitor and continuous pulse ox. Call light within reach.      Shauna Hugh, RN  03/27/21 2153

## 2021-03-28 ENCOUNTER — Inpatient Hospital Stay
Admission: EM | Admit: 2021-03-28 | Discharge: 2021-03-28 | Discharge status: Against Medical Advice | Payer: MEDICAID | Admitting: Emergency Medicine

## 2021-03-28 ENCOUNTER — Inpatient Hospital Stay: Payer: MEDICAID | Primary: Family

## 2021-03-28 ENCOUNTER — Encounter (HOSPITAL_BASED_OUTPATIENT_CLINIC_OR_DEPARTMENT_OTHER): Admitting: Emergency Medicine

## 2021-03-28 ENCOUNTER — Inpatient Hospital Stay: Admit: 2021-03-28 | Payer: MEDICAID | Primary: Family

## 2021-03-28 DIAGNOSIS — R079 Chest pain, unspecified: Secondary | ICD-10-CM

## 2021-03-28 LAB — SARS/FLU/RSV
Influenza A RNA: NOT DETECTED
Influenza B RNA: NOT DETECTED
Respiratory syncytial virus: NOT DETECTED
SARS-CoV-2 RNA PCR: NOT DETECTED

## 2021-03-28 LAB — TROPONIN I, HIGH SENSITIVITY
Troponin I, High Sensitivity: 42 ng/L (ref <=79)
Troponin I, High Sensitivity: 45 ng/L (ref <=59)

## 2021-03-28 MED ORDER — morphine injection 4 mg
4 | Freq: Once | INTRAVENOUS | Status: AC
Start: 2021-03-28 — End: 2021-03-28
  Administered 2021-03-28: 08:00:00 4 mg via INTRAVENOUS

## 2021-03-28 MED ORDER — atorvastatin (Lipitor) tablet 40 mg
40 | Freq: Every day | ORAL | Status: DC
Start: 2021-03-28 — End: 2021-03-28
  Administered 2021-03-28: 14:00:00 40 mg via ORAL

## 2021-03-28 MED ORDER — HYDROmorphone (Dilaudid) injection 1 mg
1 | Freq: Once | INTRAMUSCULAR | Status: AC
Start: 2021-03-28 — End: 2021-03-28
  Administered 2021-03-28: 14:00:00 1 mg via INTRAVENOUS

## 2021-03-28 MED ORDER — potassium chloride liquid 40 mEq
20 | Freq: Once | ORAL | Status: AC
Start: 2021-03-28 — End: 2021-03-28
  Administered 2021-03-28: 07:00:00 40 meq via ORAL

## 2021-03-28 MED ORDER — metoprolol tartrate (Lopressor) tablet 25 mg
25 | Freq: Two times a day (BID) | ORAL | Status: DC
Start: 2021-03-28 — End: 2021-03-28

## 2021-03-28 MED ORDER — aspirin EC tablet 81 mg
81 | Freq: Every day | ORAL | Status: DC
Start: 2021-03-28 — End: 2021-03-28
  Administered 2021-03-28: 14:00:00 81 mg via ORAL

## 2021-03-28 MED ORDER — morphine 4 mg/mL injection  - Omnicell Override Pull
4 | INTRAVENOUS | Status: AC
Start: 2021-03-28 — End: 2021-03-28
  Administered 2021-03-28: 07:00:00 4

## 2021-03-28 MED ORDER — sodium chloride 0.9 % bolus 1,000 mL
0.9 | Freq: Once | INTRAVENOUS | Status: AC
Start: 2021-03-28 — End: 2021-03-28
  Administered 2021-03-28: 07:00:00 1000 mL via INTRAVENOUS

## 2021-03-28 MED ORDER — HYDROmorphone (Dilaudid) injection 1 mg
1 | INTRAMUSCULAR | Status: DC | PRN
Start: 2021-03-28 — End: 2021-03-28
  Administered 2021-03-28: 11:00:00 1 mg via INTRAVENOUS

## 2021-03-28 MED ORDER — morphine injection 4 mg
4 | Freq: Once | INTRAVENOUS | Status: AC
Start: 2021-03-28 — End: 2021-03-28
  Administered 2021-03-28: 07:00:00 4 mg via INTRAVENOUS

## 2021-03-28 MED ORDER — enoxaparin (Lovenox) syringe 40 mg
40 | Freq: Every day | SUBCUTANEOUS | Status: DC
Start: 2021-03-28 — End: 2021-03-28
  Administered 2021-03-28: 11:00:00 40 mg via SUBCUTANEOUS

## 2021-03-28 MED FILL — MORPHINE 4 MG/ML INTRAVENOUS SOLUTION WRAPPER: 4 4 mg/mL | INTRAVENOUS | Qty: 1

## 2021-03-28 MED FILL — HYDROMORPHONE 1 MG/ML INJECTION WRAPPER: 1 1 mg/mL | INTRAMUSCULAR | Qty: 1

## 2021-03-28 MED FILL — POTASSIUM CHLORIDE 20 MEQ/15 ML ORAL LIQUID: 20 20 mEq/15 mL | ORAL | Qty: 30

## 2021-03-28 MED FILL — METOPROLOL TARTRATE 25 MG TABLET: 25 25 mg | ORAL | Qty: 1

## 2021-03-28 MED FILL — ENOXAPARIN 40 MG/0.4 ML SUBCUTANEOUS SYRINGE: 40 40 mg/0.4 mL | SUBCUTANEOUS | Qty: 0.4

## 2021-03-28 MED FILL — ASPIRIN 81 MG TABLET,DELAYED RELEASE: 81 81 mg | ORAL | Qty: 1

## 2021-03-28 MED FILL — ATORVASTATIN 40 MG TABLET: 40 40 mg | ORAL | Qty: 1

## 2021-03-28 NOTE — Progress Notes (Signed)
 Case Management Progress Note:   Pt not seen prior to elopement by CM.  2:25 PM

## 2021-03-28 NOTE — H&P (Signed)
 HOSPITALIST ADMISSION NOTE      03/28/2021  4:35 AM    Patient: [name]  DOB: 10-14-1975  PCP: [name], MD      CHIEF COMPLAINT:       Pain chest    HISTORY OF PRESENT ILLNESS:   Mr. [name], 45 year old male with history of CAD s/p bypass 2007 at [facility] and 2012 at [facility] presents with chest pain  Chest pain started around 8 PM yesterday, retrosternal, squeezing/stabbing, associated with nausea, vomiting, radiating pain to the left arm, denies any cough/shortness of breath/swelling of the legs except the left leg which has a torn meniscus  Nausea present  Denies any black stools  Denies any dizziness  I have syncopal episode, frequently last 1 was a month ago details unclear  Smokes about 2 cigarettes/day  Does not drink or use drugs  LIVES with his mom    Potassium 3.3, blood glucose 250 AST ALT 69/107, troponin 39, 40  CBC 5.8, hemoglobin/hematocrit 13.7 and 40.1  Normal sinus rhythm 70/min, QTC 420  REVIEW OF SYSTEMS:     Constitutional:  Negative for chills and fever.   HENT: Negative for congestion and sinus pressure.    Eyes: Negative.    Respiratory: ++ Dizziness c++  ardiovascular: Negative for palpitations , leg swelling+++.   Gastrointestinal: Negative for abdominal pain and vomiting.   Endocrine: Negative.    Genitourinary: difficulty urinating, frequency and urgency.   Musculoskeletal: Negative for back pain and joint swelling.   Skin: Negative for rash.   Allergic/Immunologic: Negative.    Neurological: Negative for dizziness and headaches.   Hematological: Does not bruise/bleed easily.   Psychiatric/Behavioral: Negative for confusion    MEDICAL & SURGICAL HX:      Past Medical History:   Diagnosis Date   . Heart attack (CMS/HCC)     x3   . Hypertension     pt denies having high BP     Patient Active Problem List   Diagnosis   . Chest pain   . Hypokalemia     Past Surgical History:   Procedure Laterality Date   . ARTERIAL BYPASS SURGERY      x2       FAMILY HX:       No  family history on file.    SOCIAL HX:     Social History     Socioeconomic History   . Marital status: Single     Spouse name: Not on file   . Number of children: Not on file   . Years of education: Not on file   . Highest education level: Not on file   Occupational History   . Not on file   Tobacco Use   . Smoking status: Every Day     Types: Cigarettes   . Smokeless tobacco: Never   Vaping Use   . Vaping Use: Not on file   Substance and Sexual Activity   . Alcohol use: Never   . Drug use: Never   . Sexual activity: Defer   Other Topics Concern   . Not on file   Social History Narrative   . Not on file     Social Determinants of Health     Financial Resource Strain: Not on file   Food Insecurity: Not on file   Transportation Needs: Not on file   Physical Activity: Not on file   Stress: Not on file   Social Connections: Not on file  Intimate Partner Violence: Not on file   Housing Stability: Not on file       ALLERGIES:      Allergies   Allergen Reactions   . Ibuprofen Anaphylaxis, Hives, Itching and Rash     Other reaction(s): Anaphylactic reaction, Unknown, Urticaria  anaphylaxis  anaphylaxis     . Ketorolac Anaphylaxis, Hives and Rash     Other reaction(s): ITCHY HIVES, Unknown, Urticaria  Converted from Generic Allergy: Ketorolac  Anaphylaxis   Anaphylaxis   Tolerates ASA     . Tramadol Anaphylaxis, Angioedema, Hives and Rash     Other reaction(s): THROAT CLOSES, Unknown, Urticaria  Patient confirmed that he can take Percocet, Hydromorphone and Morphine. [name], Pharmacy  anaphylaxis  anaphylaxis         OUTPATIENT MEDICATION:     No current facility-administered medications on file prior to encounter.     Current Outpatient Medications on File Prior to Encounter   Medication Sig Dispense Refill   . amoxicillin (Amoxil) 500 mg capsule TAKE 1 CAPSULE BY MOUTH EVERY 8 HOURS FOR 5 DAYS (Patient not taking: Reported on 09/15/2020) 15 capsule 0   . aspirin 81 mg capsule Take 81 mg by mouth in the morning.      [name] atorvastatin (Lipitor) 10 mg tablet Take 40 mg by mouth in the morning.     [name] CLINDAMYCIN HCL ORAL Take by mouth.     . metoprolol tartrate (Lopressor) 25 mg tablet Take 25 mg by mouth in the morning and 25 mg in the evening.        I have utilized all available immediate resources to obtain, update, or review the patient's current medications  PHYSICAL EXAMINATION:     Temp:  [35.9 C (96.7 F)-36.9 C (98.4 F)] 36.9 C (98.4 F)  Pulse:  [56-69] 64  Resp:  [17-23] 21  BP: (122-151)/(65-92) 137/74    Oxygen Therapy  SpO2: 95 %  Patient Activity During SpO2 Measurement: At rest  Oxygen Therapy: None (Room air)    No intake or output data in the 24 hours ending 03/28/21 0435     General: Sitting comfortably on the bed, not in distress  HEENT: Normocephalic, atraumatic, PERRLA, oral mucosa moist  Neck: Supple, normal ROM  Respiratory: CTAB, no wheezing or crackles, not in labored breathing  Cardiovascular: Normal rate and rhythm, no murmur  Gastrointestinal: Soft, nondistended, nontender, positive bowel sounds  Musculoskeletal: ROM intact all extremities, no edema  Integumentary: No rash, no bruises  Neurologic: AAOx3, GCS 15, CN II to XII intact, normal sensory and motor function  Psychiatric: Calm, cooperative    LABORATORY DATA/IMAGING      Results for orders placed or performed during the hospital encounter of 03/27/21   SARS/FLU/RSV    Specimen: Nasopharyngeal; Mucosa   Result Value Ref Range    Influenza A DNA Not Detected Not Detected, Invalid    Influenza B DNA Not Detected Not Detected, Invalid    SARS-CoV-2 RNA PCR Not Detected Not Detected    Respiratory syncytial virus Not Detected Not Detected, Invalid   Comprehensive metabolic panel   Result Value Ref Range    Sodium 140 135 - 146 mmol/L    Potassium 3.3 (L) 3.6 - 5.2 mmol/L    Chloride 107 98 - 110 mmol/L    CO2 (Bicarbonate) 27 20 - 32 mmol/L    Anion Gap 6 3 - 14 mmol/L    BUN 9 6 - 24 mg/dL    Creatinine 1.61  0.55 - 1.30 mg/dL    eGFRcr 440 >=10  UV/OZD/6.64Q*0    Glucose 250 (H) 70 - 139 mg/dL    Fasting? Unknown     Calcium 9.1 8.5 - 10.5 mg/dL    AST 69 (H) 6 - 42 U/L    ALT 107 (H) 0 - 55 U/L    Alkaline phosphatase 69 30 - 130 U/L    Protein, total 6.8 6.0 - 8.4 g/dL    Albumin 3.4 3.2 - 5.0 g/dL    Bilirubin, total 0.5 0.2 - 1.2 mg/dL   Troponin I, High Sensitivity   Result Value Ref Range    Troponin I, High Sensitivity 39 <=79 ng/L   CBC w/ Differential   Result Value Ref Range    WBC 5.8 4.0 - 11.0 K/uL    RBC 4.45 4.20 - 5.90 M/uL    Hemoglobin 13.7 13.0 - 17.5 g/dL    Hematocrit 34.7 42.5 - 53.0 %    MCV 90.1 80.0 - 100.0 fL    MCH 30.8 26.0 - 34.0 pg    MCHC 34.2 31.0 - 37.0 g/dL    RDW-CV 95.6 38.7 - 56.4 %    RDW-SD 41.5 35.0 - 51.0 fL    Platelets 198 150 - 400 K/uL    MPV 10.8 9.1 - 12.4 fL    Neutrophil % 58.3 %    Lymphocyte % 32.8 %    Monocytes % 6.7 %    Eosinophils % 1.2 %    Basophils % 0.5 %    Immature Granulocytes % 0.5 %    NRBC % 0.0 0.0 - 0.0 %    Neutrophils Absolute 3.37 1.50 - 7.95 K/uL    Lymphocytes Absolute 1.90 0.70 - 4.00 K/uL    Monocytes Absolute 0.39 0.38 - 0.83 K/uL    Eosinophils Absolute 0.07 0.00 - 0.50 K/uL    Basophils Absolute 0.03 0.00 - 0.22 K/uL    Immature Granulocytes Absolute 0.03 0.00 - 0.10 K/uL    NRBC Absolute 0.00 0.00 - 2.00 K/uL   Light Blue Top   Result Value Ref Range    Extra Tube Hold for add-ons.    Troponin I, High Sensitivity   Result Value Ref Range    Troponin I, High Sensitivity 42 <=79 ng/L       Results     Procedure Component Value Units Date/Time    SARS/FLU/RSV [[id number]]  (Normal) Collected: 03/28/21 0314    Order Status: Completed Specimen: Mucosa from Nasopharyngeal Updated: 03/28/21 0411     Influenza A DNA Not Detected     Influenza B DNA Not Detected     SARS-CoV-2 RNA PCR Not Detected     Respiratory syncytial virus Not Detected           XR CHEST 2 VIEWS   Final Result      No radiographically apparent acute cardiopulmonary process.      Elvin So Matich 03/27/2021 10:07 PM            IMPRESSION/PLAN:   Mr. Cisek, 45 year old male with history of CAD s/p bypass 2007 at [facility] and 2012 at [facility] presents with chest pain.,  Labs show normal troponins given morphine 4 mg twice in the ER    #Chest pain  Coronary artery disease s/p CABG twice  Continue aspirin 81 mg   Continue atorvastatin 40 mg daily  Continue metoprolol 25 mg twice daily  Cardiology consult tomorrow   repeat echocardiogram  Monitor on  telemetry    #CAD s/p CABG  See above    DVT Prophylaxis: Enoxaparin  GI prophylaxis:   Diet/Nutrition:   Code Status: Full code    I confirmed today that the patient's Advanced Care Plan is documented in the medical record either by discussing and documenting the patient's Advance Care Plan, confirming that the patient's surrogate decision maker is documented in the medical record, or confirming that the patient's Advanced Care Plan is presently documented.    I have discussed the plan of care with:    FACE TO FACE PATIENT COUNSELLING COORDINATING CARE MORE THAN 50% OF ENCOUNTER TIME : YES    TOTAL ENCOUNTER TIME: 35 minutes    I certify that hospital inpatient services are reasonable and medically necessary. They are appropriately provided as inpatient services in accordance with the two midnight benchmark under 42CFR 412 3(e), or the services are specified as inpatient only procedure under 42 CFR 419 22(n).         REASON FOR INPATIENT SERVICES: Chest pain, high risk   ESTIMATED DURATION OF HOSPITALIZATION: 1 to 2 days might require stress imaging    Disclaimer: This note was generated with Dragon Medical voice recognition program.  Please excuse any errors which may have been overlooked during review.  Sometimes, these errors may affect the content or meaning of a given sentence.    Girtha Hake, MD  Attending physician  03/28/2021

## 2021-03-28 NOTE — ED Notes (Addendum)
 Report to Med 3 RN Lulu. Patient to be transported in stable condition.      Shauna Hugh, RN  03/28/21 0981       Shauna Hugh, RN  03/28/21 [phone]

## 2021-03-28 NOTE — Discharge Summary (Signed)
 Discharge Diagnosis  Chest pain    Hospital Course    [name], 45 year old male with history of CAD s/p bypass 2007 at [facility] and 2012 at [facility] presents with chest pain, multiple ed presentations at various hospitals for the same, with elopements, patient eloped this morning from our facility also. Initially wanted to sign out AMA but informed the RN he would stay and after that he eloped. He was not seen by attending, myself, due to his elopement.    Test Results Pending At Discharge    Pertinent Physical Exam At Time of Discharge  Physical Exam  N/a patient eloped    Issues Requiring Follow-Up  Patient eloped    Outpatient Follow-Up  No future appointments.

## 2021-03-28 NOTE — ED Notes (Signed)
 Patient reports that first dose of morphine helped reduce pain. Patient denies pain currently. Ok to eat, patient given food and drink.     Shauna Hugh, RN  03/28/21 [phone]

## 2021-03-28 NOTE — Nursing Note (Signed)
 Pt requesting pain medication, this RN informed pt PRNs are q4hr. Next dose available at 1000. Pt getting increasingly aggravated, MD aware. Threatening to leave AMA. MD ordered 1-time dose of pain medication. Pt agreeable to stay after meds and transported down via wheelchair to [facility]. This RN rec'd phone call from [facility] stating pt asked to use the bathroom. [facility] knocked on bathroom door as pt was in for prolonged period of time. Upon opening door, pt not found in BR. ANS and MD aware. [place] PD contacted as pt still had periph IV in arm. Attempted to contact pt via phone multiple times with no response.

## 2021-03-28 NOTE — ED Notes (Signed)
 Patient ate food, denies needing anything at this time. Call light within reach.      Shauna Hugh, RN  03/28/21 0500

## 2021-03-28 NOTE — Care Plan (Signed)
 Problem: Adult Inpatient Plan of Care  Goal: Plan of Care Review  Outcome: Ongoing, Progressing   Goal Outcome Evaluation:   45 year old male admitted to room 347-A , A&Ox 4, RA, SB/SR on tele, C/O Chest pain, dilaudid 1 mg IV given with positive effect and well tolerated, admission assessment completed and noted, patient oriented to room, resting comfortably in bed, safety and comfort maintained, bed in lowest position, call light within reach. Will cont w/poc.

## 2021-04-03 LAB — PROTHROMBIN TIME CARE EVERYWHERE
INR CARE EVERYWHERE: 1
PROTHROMBIN TIME CARE EVERYWHERE: 11.1 s (ref 10.0–13.5)

## 2021-04-04 LAB — UNMAPPED LAB RESULTS: Creatinine, urine, random (INT/EXT): 51 mg/dL

## 2021-04-05 ENCOUNTER — Encounter

## 2021-05-18 ENCOUNTER — Emergency Department: Admit: 2021-05-18 | Payer: Medicaid - Out of State

## 2021-05-18 ENCOUNTER — Inpatient Hospital Stay: Admit: 2021-05-18 | Discharge: 2021-05-18 | Payer: Medicaid - Out of State | Attending: Emergency Medicine

## 2021-05-18 DIAGNOSIS — R079 Chest pain, unspecified: Secondary | ICD-10-CM

## 2021-05-18 LAB — LIPID PROFILE (EXT)
Chol/HDL Ratio (EXT): 6.6 — ABNORMAL HIGH (ref 0.0–5.0)
Cholesterol (EXT): 231 mg/dL — ABNORMAL HIGH
HDL Cholesterol (EXT): 35 mg/dL — ABNORMAL LOW (ref >=40)
LDL Cholesterol, CALC (EXT): 127 mg/dL — ABNORMAL HIGH
Triglycerides (EXT): 383 mg/dL — ABNORMAL HIGH

## 2021-05-18 LAB — BMP (EXT)
Anion Gap (EXT): 12 (ref 7–17)
BUN (EXT): 12 mg/dL (ref 6–20)
BUN/CREAT Ratio (EXT): 17.4 (ref 8.0–23.0)
CO2 (EXT): 24 mmol/L (ref 20–30)
CalciumCalcium (EXT): 9.4 mg/dL (ref 8.8–10.2)
Chloride (EXT): 98 mmol/L (ref 98–107)
Creatinine (EXT): 0.69 mg/dL (ref 0.40–1.30)
Glucose (EXT): 559 mg/dL — CR (ref 70–100)
Potassium (EXT): 4.6 mmol/L (ref 3.3–5.3)
Sodium (EXT): 134 mmol/L — ABNORMAL LOW (ref 136–144)
eGFR - Creat CKD-EPI (EXT): >60 mL/min/1.73m2 (ref >=60)

## 2021-05-18 LAB — BASIC METABOLIC PANEL
BKR ANION GAP: 12 (ref 7–17)
BKR BLOOD UREA NITROGEN: 12 mg/dL (ref 6–20)
BKR BUN / CREAT RATIO: 17.4 (ref 8.0–23.0)
BKR CALCIUM: 9.4 mg/dL (ref 8.8–10.2)
BKR CHLORIDE: 98 mmol/L (ref 98–107)
BKR CO2: 24 mmol/L (ref 20–30)
BKR CREATININE: 0.69 mg/dL (ref 0.40–1.30)
BKR EGFR, CREATININE (CKD-EPI 2021): >60 mL/min/{1.73_m2} (ref >=60)
BKR GLUCOSE: 559 mg/dL — CR (ref 70–100)
BKR POTASSIUM: 4.6 mmol/L (ref 3.3–5.3)
BKR SODIUM: 134 mmol/L — ABNORMAL LOW (ref 136–144)

## 2021-05-18 LAB — CBC WITH AUTO DIFFERENTIAL
BKR WAM ABSOLUTE IMMATURE GRANULOCYTES.: 0.03 x 1000/ÂµL (ref 0.00–0.30)
BKR WAM ABSOLUTE LYMPHOCYTE COUNT.: 1.65 x 1000/ÂµL (ref 0.60–3.70)
BKR WAM ABSOLUTE NRBC (2 DEC): 0 x 1000/ÂµL (ref 0.00–1.00)
BKR WAM ANALYZER ANC: 4.02 x 1000/ÂµL (ref 2.00–7.60)
BKR WAM BASOPHIL ABSOLUTE COUNT.: 0.02 x 1000/ÂµL (ref 0.00–1.00)
BKR WAM BASOPHILS: 0.3 % (ref 0.0–1.4)
BKR WAM EOSINOPHIL ABSOLUTE COUNT.: 0.08 x 1000/ÂµL (ref 0.00–1.00)
BKR WAM EOSINOPHILS: 1.3 % (ref 0.0–5.0)
BKR WAM HEMATOCRIT (2 DEC): 43.7 % (ref 38.50–50.00)
BKR WAM HEMOGLOBIN: 14.3 g/dL (ref 13.2–17.1)
BKR WAM IMMATURE GRANULOCYTES: 0.5 % (ref 0.0–1.0)
BKR WAM LYMPHOCYTES: 27.3 % (ref 17.0–50.0)
BKR WAM MCH (PG): 29.9 pg (ref 27.0–33.0)
BKR WAM MCHC: 32.7 g/dL (ref 31.0–36.0)
BKR WAM MCV: 91.2 fL (ref 80.0–100.0)
BKR WAM MONOCYTE ABSOLUTE COUNT.: 0.24 x 1000/ÂµL (ref 0.00–1.00)
BKR WAM MONOCYTES: 4 % (ref 4.0–12.0)
BKR WAM MPV: 11 fL (ref 8.0–12.0)
BKR WAM NEUTROPHILS: 66.6 % (ref 39.0–72.0)
BKR WAM NUCLEATED RED BLOOD CELLS: 0 % (ref 0.0–1.0)
BKR WAM PLATELETS: 214 x1000/ÂµL (ref 150–420)
BKR WAM RDW-CV: 12.3 % (ref 11.0–15.0)
BKR WAM RED BLOOD CELL COUNT.: 4.79 M/ÂµL (ref 4.00–6.00)
BKR WAM WHITE BLOOD CELL COUNT: 6 x1000/ÂµL (ref 4.0–11.0)

## 2021-05-18 LAB — NT-PROBNPE: BKR B-TYPE NATRIURETIC PEPTIDE, PRO (PROBNP): 153 pg/mL — ABNORMAL HIGH (ref <125.0)

## 2021-05-18 LAB — LIPID PANEL
BKR CHOLESTEROL/HDL RATIO: 6.6 — ABNORMAL HIGH (ref 0.0–5.0)
BKR CHOLESTEROL: 231 mg/dL — ABNORMAL HIGH
BKR HDL CHOLESTEROL: 35 mg/dL — ABNORMAL LOW (ref >=40)
BKR LDL CHOLESTEROL SAMPSON CALCULATED: 127 mg/dL — ABNORMAL HIGH
BKR TRIGLYCERIDES: 383 mg/dL — ABNORMAL HIGH

## 2021-05-18 LAB — TROPONIN T HIGH SENSITIVITY, 0 HOUR BASELINE WITH REFLEX (BH GH LMW YH): BKR TROPONIN T HS 0 HOUR BASELINE: 7 ng/L

## 2021-05-18 LAB — MAGNESIUM: BKR MAGNESIUM: 1.7 mg/dL (ref 1.7–2.4)

## 2021-05-18 LAB — TROPONIN T HIGH SENSITIVITY, 1 HOUR WITH REFLEX (BH GH LMW YH)
BKR TROPONIN T HS 1 HOUR DELTA FROM 0 HOUR: -1 ng/L
BKR TROPONIN T HS 1 HOUR: <6 ng/L

## 2021-05-18 MED ORDER — MORPHINE 4 MG/ML INTRAVENOUS SOLUTION
4 mg/mL | Freq: Once | INTRAVENOUS | Status: CP
Start: 2021-05-18 — End: ?
  Administered 2021-05-18: 4 mL via INTRAVENOUS

## 2021-05-18 MED ORDER — MORPHINE 4 MG/ML INTRAVENOUS SOLUTION
4 mg/mL | Freq: Once | INTRAVENOUS | Status: CP
Start: 2021-05-18 — End: ?
  Administered 2021-05-18: 23:00:00 4 mL via INTRAVENOUS

## 2021-05-18 MED ORDER — INSULIN U-100 REGULAR HUMAN 100 UNIT/ML INJECTION SOLUTION
100 unit/mL | Freq: Once | SUBCUTANEOUS | Status: CP
Start: 2021-05-18 — End: ?
  Administered 2021-05-18: 23:00:00 100 unit/mL via SUBCUTANEOUS

## 2021-05-18 MED ORDER — LACTATED RINGERS IV BOLUS (NEW BAG)
Freq: Once | INTRAVENOUS | Status: CP
Start: 2021-05-18 — End: ?
  Administered 2021-05-18: 23:00:00 1000.000 mL/h via INTRAVENOUS

## 2021-05-18 MED ORDER — ASPIRIN 81 MG CHEWABLE TABLET
81 mg | Freq: Once | ORAL | Status: CP
Start: 2021-05-18 — End: ?
  Administered 2021-05-18: 22:00:00 81 mg via ORAL

## 2021-05-18 NOTE — ED Notes
4:32 PM 30 M presents to [facility] ED for evaluation of 9/10 squeezing chest pain radiating to Left arm and Left jaw x45 minutes. Pt states he has hx of multiple MI with subsequent CABG with similar presentation. He further c/o nausea, no vomiting. Pt states he has been med compliant except for rx ASA today. Pt denies HA, dizziness, sob, abd pain, d/f/c. PIV established, labs drawn/sent. Pending provider eval.4:52 PM Pt to cxr now with hosp transport staff

## 2021-05-19 ENCOUNTER — Encounter: Admit: 2021-05-19 | Payer: PRIVATE HEALTH INSURANCE

## 2021-05-19 DIAGNOSIS — I2510 Atherosclerotic heart disease of native coronary artery without angina pectoris: Secondary | ICD-10-CM

## 2021-05-19 DIAGNOSIS — Z91018 Allergy to other foods: Secondary | ICD-10-CM

## 2021-05-19 DIAGNOSIS — E119 Type 2 diabetes mellitus without complications: Secondary | ICD-10-CM

## 2021-05-19 DIAGNOSIS — I10 Essential (primary) hypertension: Secondary | ICD-10-CM

## 2021-05-19 DIAGNOSIS — I252 Old myocardial infarction: Secondary | ICD-10-CM

## 2021-05-19 DIAGNOSIS — Z886 Allergy status to analgesic agent status: Secondary | ICD-10-CM

## 2021-05-19 DIAGNOSIS — Z885 Allergy status to narcotic agent status: Secondary | ICD-10-CM

## 2021-05-19 DIAGNOSIS — Z7984 Long term (current) use of oral hypoglycemic drugs: Secondary | ICD-10-CM

## 2021-05-19 DIAGNOSIS — Z79899 Other long term (current) drug therapy: Secondary | ICD-10-CM

## 2021-05-19 DIAGNOSIS — Z888 Allergy status to other drugs, medicaments and biological substances status: Secondary | ICD-10-CM

## 2021-05-19 DIAGNOSIS — Z7982 Long term (current) use of aspirin: Secondary | ICD-10-CM

## 2021-05-19 DIAGNOSIS — F17200 Nicotine dependence, unspecified, uncomplicated: Secondary | ICD-10-CM

## 2021-05-19 DIAGNOSIS — R0789 Other chest pain: Secondary | ICD-10-CM

## 2021-05-19 DIAGNOSIS — Z951 Presence of aortocoronary bypass graft: Secondary | ICD-10-CM

## 2021-05-19 DIAGNOSIS — E7800 Pure hypercholesterolemia, unspecified: Secondary | ICD-10-CM

## 2021-05-19 NOTE — Telephone Encounter
Outreach to [place]  to speak about recent ED Visit. Outreach attempt was unsuccessful. Left voicemail, No active MyChart.

## 2021-05-19 NOTE — ED Provider Notes
=======================================  Resident Medical Decision Making:05/18/2021 8:00 PMHPI and ROS:45 M w/ DM HTN HLD CAD s/p MI and stents here with midsternal constant CP for 6 hours denies SOB syncope LE edema palpitations, does not feel like his prior MI, denies fevers chills nausea vomiting diarrhea diaphoresis.Pertinent PE Findings:Well appearing reproducible CP, clear heart and lungs no MRG WRR, no LE edema, equal radial DP pulses normal VSPlan:Pt with possible MSK chest pain, ACS, doubt PE dissectionCBC BMP LFT Trop ECG MorphineNegative WU non-ischemic ECGWas preparing to discharge, pt became angry no more opiatesEloped, self removed IV All decisions were discussed with the attending physician.Charlann Noss, MDEmergency Medicine12/29/2022 8:00 PM=======================================HistoryChief Complaint Patient presents with ? Chest Pain  OtherThis is a new problem. The current episode started less than 1 hour ago. The problem occurs constantly. The problem has not changed since onset.Associated symptoms include chest pain. Pertinent negatives include no abdominal pain, no headaches and no shortness of breath.  Past Medical History: Diagnosis Date ? Chronic coronary artery disease  ? History of single vessel coronary artery bypass  ? Hypercholesteremia  ? Hypertension  ? MI (myocardial infarction) (HC Code) (HC CODE) (HC Code)  Past Surgical History: Procedure Laterality Date ? CARDIAC SURGERY   No family history on file.Social History Socioeconomic History ? Marital status: Single Tobacco Use ? Smoking status: Every Day Substance and Sexual Activity ? Alcohol use: Never ? Drug use: Never ED Other Social History E-cigarette/Vaping Substances E-cigarette/Vaping Devices Review of Systems Constitutional: Negative for fever. HENT: Negative for congestion. Eyes: Negative for visual disturbance. Respiratory: Negative for shortness of breath.  Cardiovascular: Positive for chest pain. Gastrointestinal: Negative for abdominal pain. Genitourinary: Negative for dysuria. Musculoskeletal: Negative for back pain. Skin: Negative for wound. Neurological: Negative for syncope and headaches. Psychiatric/Behavioral: Negative for confusion. All other systems reviewed and are negative. Physical ExamED Triage Vitals [05/18/21 1647]BP: 137/77Pulse: 82Pulse from  O2 sat: n/aResp: 16Temp: 98.8 ?F (37.1 ?C)Temp src: OralSpO2: 97 % BP 137/77  - Pulse 82  - Temp 98.8 ?F (37.1 ?C) (Oral)  - Resp 16  - SpO2 97% Physical ExamVitals and nursing note reviewed. HENT:    Head: Normocephalic.    Nose: Nose normal.    Mouth/Throat:    Mouth: Mucous membranes are moist. Eyes:    Extraocular Movements: Extraocular movements intact. Pulmonary:    Effort: Pulmonary effort is normal.    Breath sounds: Normal breath sounds. Abdominal:    Palpations: Abdomen is soft.    Tenderness: There is no abdominal tenderness. Musculoskeletal:       General: Normal range of motion.    Cervical back: Normal range of motion. Skin:   General: Skin is warm. Neurological:    General: No focal deficit present.    Mental Status: He is alert.  ProceduresProcedures Attestation/Critical CarePatient Reevaluation: Attending Supervised: ResidentI saw and examined the patient. I agree with the findings and plan of care as documented except as noted below. Additional acute and/or chronic problems addressed: Diabetic on metformin with history of MI s/p stents Presenting with chest pain at restConcerning for unstable anginaECG without acute ischemic changesHS troponin 7Glucose 559 - no anion gapChart review - previous admissions [facility]at[facility] EvansClinical Impressions as of 05/19/21 1812 Chest pain, unspecified type  ED DispositionEloped Charlann Noss, MDResident12/29/22 2034 Lajoyce Corners, MD12/30/22 [phone]

## 2021-05-19 NOTE — ED Notes
8:06 PM Patient reporting he wants IVs removed. Patient verbalizing he wants to go home. Patient not discharge at this time. IVs removed. Patient eloped.

## 2021-06-10 LAB — BMP (EXT)
Anion Gap (EXT): 13 mmol/L (ref 3–17)
BUN (EXT): 9 mg/dL (ref 8–25)
CO2 (EXT): 24 mmol/L (ref 23–32)
CalciumCalcium (EXT): 9.4 mg/dL (ref 8.5–10.5)
Chloride (EXT): 100 mmol/L (ref 98–108)
Creatinine (EXT): 0.86 mg/dL (ref 0.60–1.50)
GFR Estimated (Calc) (EXT): 110 mL/min/{1.73_m2} (ref >59)
Glucose (EXT): 359 mg/dL — ABNORMAL HIGH (ref 70–110)
Potassium (EXT): 3.8 mmol/L (ref 3.4–5.0)
Sodium (EXT): 137 mmol/L (ref 135–145)

## 2021-06-27 ENCOUNTER — Emergency Department (HOSPITAL_BASED_OUTPATIENT_CLINIC_OR_DEPARTMENT_OTHER): Payer: No Typology Code available for payment source

## 2021-06-27 ENCOUNTER — Emergency Department
Admission: EM | Admit: 2021-06-27 | Discharge: 2021-06-27 | Discharge status: Against Medical Advice | Payer: No Typology Code available for payment source | Attending: Emergency Medicine | Admitting: Emergency Medicine

## 2021-06-27 DIAGNOSIS — R9431 Abnormal electrocardiogram [ECG] [EKG]: Secondary | ICD-10-CM

## 2021-06-27 DIAGNOSIS — R079 Chest pain, unspecified: Secondary | ICD-10-CM | POA: Diagnosis present

## 2021-06-27 LAB — BMP (EXT)
Anion Gap (EXT): 14 mmol/L (ref 10–22)
BUN (EXT): 13 mg/dL (ref 7–18)
CO2 (EXT): 22 mmol/L (ref 21–32)
CalciumCalcium (EXT): 9.8 mg/dL (ref 8.5–10.5)
Chloride (EXT): 100 mmol/L (ref 98–107)
Creatinine (EXT): 0.9 mg/dL (ref 0.7–1.2)
Glucose (EXT): 287 mg/dL — ABNORMAL HIGH (ref 74–160)
Potassium (EXT): 5.1 mmol/L (ref 3.5–5.1)
Sodium (EXT): 136 mmol/L (ref 136–145)
eGFR - Creat CKD-EPI (EXT): >60 mL/min (ref >60)

## 2021-06-27 LAB — BASIC METABOLIC PANEL
ANION GAP: 14 mmol/L (ref 10–22)
BUN (UREA NITROGEN): 13 mg/dL (ref 7–18)
CALCIUM: 9.8 mg/dL (ref 8.5–10.5)
CARBON DIOXIDE: 22 mmol/L (ref 21–32)
CHLORIDE: 100 mmol/L (ref 98–107)
CREATININE: 0.9 mg/dL (ref 0.7–1.2)
ESTIMATED GLOMERULAR FILT RATE: >60 mL/min (ref >60)
Glucose Random: 287 mg/dL — ABNORMAL HIGH (ref 74–160)
POTASSIUM: 5.1 mmol/L (ref 3.5–5.1)
SODIUM: 136 mmol/L (ref 136–145)

## 2021-06-27 LAB — TROPONIN T HS 3 HOUR
DELTA 3 HOUR TROPONIN T HS: 0 ng/L (ref 0–7)
TROPONIN T HS 3 HOUR RESULT: 9 ng/L (ref 0–15)

## 2021-06-27 LAB — CBC, PLATELET & DIFFERENTIAL
ABSOLUTE BASO COUNT: 0 10*3/uL (ref 0.0–0.1)
ABSOLUTE EOSINOPHIL COUNT: 0.1 10*3/uL (ref 0.0–0.8)
ABSOLUTE IMM GRAN COUNT: 0.03 10*3/uL (ref 0.00–0.10)
ABSOLUTE LYMPH COUNT: 2.6 10*3/uL (ref 0.6–5.9)
ABSOLUTE MONO COUNT: 0.4 10*3/uL (ref 0.2–1.4)
ABSOLUTE NEUTROPHIL COUNT: 3.2 10*3/uL (ref 1.6–8.3)
ABSOLUTE NRBC COUNT: 0 10*3/uL (ref 0.0–0.0)
BASOPHIL %: 0.5 % (ref 0.0–1.2)
EOSINOPHIL %: 1 % (ref 0.0–7.0)
HEMATOCRIT: 46.5 % (ref 40.1–51.0)
HEMOGLOBIN: 15.5 g/dL (ref 13.7–17.5)
IMMATURE GRANULOCYTE %: 0.5 % (ref 0.0–1.0)
LYMPHOCYTE %: 41.8 % (ref 15.0–54.0)
MEAN CORP HGB CONC: 33.3 g/dL (ref 31.0–37.0)
MEAN CORPUSCULAR HGB: 30.3 pg (ref 26.0–34.0)
MEAN CORPUSCULAR VOL: 90.8 fl (ref 80.0–100.0)
MEAN PLATELET VOLUME: 11.2 fL (ref 8.7–12.5)
MONOCYTE %: 5.6 % (ref 4.0–13.0)
NEUTROPHIL %: 50.6 % (ref 40.0–75.0)
NRBC %: 0 % (ref 0.0–0.0)
PLATELET COUNT: 256 10*3/uL (ref 150–400)
RBC DISTRIBUTION WIDTH STD DEV: 40.7 fL (ref 35.1–46.3)
RED BLOOD CELL COUNT: 5.12 M/uL (ref 4.60–6.10)
WHITE BLOOD CELL COUNT: 6.3 10*3/uL (ref 4.0–11.0)

## 2021-06-27 LAB — TROPONIN T HS 1 HOUR

## 2021-06-27 LAB — HOLD BLUE TOP TUBE

## 2021-06-27 LAB — TROPONIN T HS BASELINE: TROPONIN T HS BASELINE: 9 ng/L (ref 0–15)

## 2021-06-27 LAB — NT-PROBNP: NT-proBNP: 91 pg/mL (ref 0–125)

## 2021-06-27 MED ORDER — ASPIRIN 325 MG PO TABS
325.00 mg | ORAL_TABLET | Freq: Once | ORAL | Status: AC
Start: 2021-06-27 — End: 2021-06-27
  Administered 2021-06-27: 325 mg via ORAL
  Filled 2021-06-27: qty 1

## 2021-06-27 MED ORDER — HYDROMORPHONE HCL 1 MG/ML IJ SOLN (SUPER ERX)
1.00 mg | Freq: Once | Status: AC
Start: 2021-06-27 — End: 2021-06-27
  Administered 2021-06-27: 1 mg via INTRAVENOUS
  Filled 2021-06-27: qty 1

## 2021-06-27 MED ORDER — OXYCODONE-ACETAMINOPHEN 5-325 MG PO TABS
2.00 | ORAL_TABLET | Freq: Once | ORAL | Status: AC
Start: 2021-06-27 — End: 2021-06-27
  Administered 2021-06-27: 2 via ORAL
  Filled 2021-06-27: qty 2

## 2021-06-27 MED ORDER — MORPHINE SULFATE 4 MG/ML IV SOLN (SUPER ERX)
4.00 mg | Freq: Once | Status: AC
Start: 2021-06-27 — End: 2021-06-27
  Administered 2021-06-27: 4 mg via INTRAVENOUS
  Filled 2021-06-27: qty 1

## 2021-06-27 MED ORDER — ONDANSETRON HCL 4 MG/2ML IJ SOLN
4.00 mg | Freq: Once | INTRAMUSCULAR | Status: AC
Start: 2021-06-27 — End: 2021-06-27
  Administered 2021-06-27: 4 mg via INTRAVENOUS
  Filled 2021-06-27: qty 2

## 2021-06-27 NOTE — ED Provider Notes (Signed)
The patient was seen primarily by me. ED nursing record was reviewed. Select prior records as available electronically through the Epic record were reviewed.       HPI:    Nicholas Salas is a 46 year old male patient who has a past medical history of Acute myocardial infarction, unspecified site, episode of care unspecified, CAD (coronary artery disease), Hypercholesteremia, MI (myocardial infarction) ([place]), and Mitral valve prolapse. The pt presents for evaluation of left-sided chest pain with radiation to the left arm and up to the jaw while the patient was walking to his cousin's house.  Patient reports the pain is similar compared to prior episode of MI.  He did not take his daily aspirin today.  He denies associated symptoms in particular no cough, shortness of breath, emesis, diarrhea or abdominal pain.  No diaphoresis.  Patient reports nausea.        Triage Documentation     Geanie Kenning, RN 06/27/2021 02:25             Ambulatory to ED. A+O, speaking full and complete sentences.    Pt reports that he was walking to his cousin's house, began experiencing left sided chest pain with radiation to the left arm and up to the jaw.  Pt reports pain with exertion and with rest.  Pt reports extensive cardiac history, including surgery and MI's. Pt reports last cardiac surgery was in 2012, and had an MI 2021.   Pt denies taking his daily ASA today.    Pt reports his cardiologist is at [facility], currently retired, and is now waiting to see who his new cardiologist will be.                   ROS: Pertinent positives were reviewed as per the HPI above. All other systems were reviewed and are negative.  Quillian Quince  Language of care: English  MRN: [id number]  PCP: None  Mode of arrival to ED: Relative.  Chief complaint: Chest Pain    Past Medical History/Problem list:  Past Medical History:  No date: Acute myocardial infarction, unspecified site, episode of   care unspecified  No date: CAD (coronary artery disease)  No date:  Hypercholesteremia  No date: MI (myocardial infarction) ([place])  No date: Mitral valve prolapse  Patient Active Problem List:     Controlled Medication request    Past Surgical History: Past Surgical History:  2007: CABG W/ARTERIAL GRAFT SINGLE ARTERIAL GRAFT  No date: PR MINIMALLY INVASIVE DIRECT CO  Social History:   Social History     Socioeconomic History   . Marital status: Single     Spouse name: Not on file   . Number of children: Not on file   . Years of education: Not on file   . Highest education level: Not on file   Occupational History   . Not on file   Tobacco Use   . Smoking status: Every Day     Packs/day: 0.25     Types: Cigarettes   . Smokeless tobacco: Never   Substance and Sexual Activity   . Alcohol use: Not on file   . Drug use: Never   . Sexual activity: Not on file   Other Topics Concern   . Not on file   Social History Narrative   . Not on file   Social Determinants of Health  Financial Resource Strain: Not on file  Food Insecurity: Not on file  Transportation Needs: Not on file  Physical Activity: Not on file  Stress: Not on file  Social Connections: Not on file  Intimate Partner Violence: Not on file  Housing Stability: Not on file     Allergies: Review of Patient's Allergies indicates:   Ketorolac trometham*    Hives   Morphine and related    Anaphylaxis   Nitroglycerin           Hives, Other (See Comments)    Comment:"syncope and hives"             Patient states "passess out"             States drops his BP "too much"   Morphine sulfate-na*        Comment:Pt. States can take dilaudid and other narcotics   Motrin [ibuprofen]      Hives   Oxycodone               Other (See Comments)    Comment:States makes him feel funny   Toradol [ketorolac *    Other (See Comments)   Tramadol                Other (See Comments)   Nitrogen                Hives    Immunizations:   There is no immunization history on file for this patient.       Medications:  Prior to Admission Medications   Prescriptions  Last Dose Informant Patient Reported? Taking?   ASPIRIN 81 MG OR TABS   Yes No   Sig: daily   LIPITOR 20 MG OR TABS   Yes No   Sig: daily   METOPROLOL TARTRATE OR   Yes No   Sig: None Entered      Facility-Administered Medications: None     Physical Exam (ED Bed 17/17-A):   Patient Vitals for the past 999 hrs:   BP Temp Temp src Pulse Resp SpO2 Weight   06/27/21 0536 126/68 -- -- 77 20 96 % --   06/27/21 0437 124/80 -- -- 73 19 -- --   06/27/21 0407 124/80 98.9 F TEMPORAL 78 19 97 % --   06/27/21 0338 123/87 -- -- 79 18 -- --   06/27/21 0216 123/87 98.5 F -- 96 20 98 % 122.5 kg (270 lb)     GENERAL:  WDWN, no acute distress, non-toxic   SKIN:  Warm & Dry, no rash, no petechiae or purpurae.  HEAD:  NCAT. Sclerae are anicteric and aninjected, oropharynx is clear with moist mucous membranes. PERRL.  NECK:  Supple, no LAN.  LUNGS:  Clear to auscultation bilaterally. No wheezes, rales, rhonchi.   HEART:  RRR.  No murmurs, rubs, or gallops.   ABDOMEN:  Soft, NTND.  No involuntary guarding or rebound.   EXTREMITIES:  No obvious deformities. No cyanosis. No edema. Negative Homans B.  GENITOURINARY:  No CVA tenderness B.  NEUROLOGIC:  Alert; moves all extremities; speaking in clear fluent sentences.   PSYCHIATRIC:  Appropriate for age, time of day, and situation    Medications Given in the ED:    Medications   ondansetron [facility]) injection 4 mg (4 mg Intravenous Given 06/27/21 0338)   aspirin tablet 325 mg (325 mg Oral Given 06/27/21 0339)   morphine injection 4 mg (4 mg Intravenous Given 06/27/21 0338)   morphine injection 4 mg (4 mg Intravenous Given 06/27/21 0437)   HYDROmorphone (DILAUDID) injection 1 mg (1 mg Intravenous Given  06/27/21 0536)   oxyCODONE-acetaminophen (PERCOCET) 5-325 MG per tablet 2 tablet (2 tablets Oral Given 06/27/21 0647)    Radiology Results:  See ED COURSE   Lab Results:     Labs Reviewed   BASIC METABOLIC PANEL - Abnormal; Notable for the following components:       Result Value    Glucose Random 287 (*)      All other components within normal limits   CBC, PLATELET & DIFFERENTIAL   TROPONIN T HS BASELINE   NT-PROBNP   TROPONIN T HS 1 HOUR   TROPONIN T HS 3 HOUR   HOLD BLUE TOP TUBE            Other Results and OLD/PRIOR records information and data (e.g. ECG, visual acuity):  See ED COURSE     ED Course and Medical Decision-making:  46 year old male with past medical history of CAD, CABG, presenting for evaluation of left-sided chest pain which started while the patient was walking to his cousin's house.  Patient reports that he did not take his baby aspirin today.  He reports mild nausea.  He has no associated symptoms.  Upon presentation, vital signs are stable.  EKG performed immediately upon presentation and with no acute changes compared to prior EKG on file.  Due to significant past medical history of the patient, will order cardiac markers, chest x-ray, will administer full dose of aspirin, and reassess.    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  Per review of medical records, patient was last admitted for chest pain in October 2022, and left AGAINST MEDICAL ADVICE, chart review at the time showed that the patient was seen 72 times in the emergency department over the past 12 months, and had 3 stress tests.  He was last evaluated in the emergency department yesterday for dental pain at [facility], and today in the ED at [facility] for chest pain.       ED Course as of 06/29/21 1255   Tue Jun 27, 2021   0702 The patient reports still having chest pain. He reports that he is ready to be discharged.  I explained to the patient that due to his past medical history, he should stay admitted and contact cardiology.  The patient is adamant that he would like to be discharged at this time and immediately.  He reports that he is "in between cardiologist".  I explained to him that due to his history of  CABG, and MI, this is really important that he takes his medications regularly and have regular cards follow-up.  The patient is agreeable to have follow-up established with a cardiologist at [facility].  I reviewed all the risk of leaving [place] with Liane Comber, RN as a witness.  The patient was with a friend in the room.  He understands the risks to leave [place] including death and permanent disability.  I encouraged him to come back to the emergency department at any time.  The patient does not appear under the influence as has capacity  The patient was able to receive, process, and understand the information I provided about the patient's medical condition. In addition, the patient showed the ability to deliberate, make choices, and communicate those preferences. For these reasons, I determined that the patient had the capacity to make informed decisions regarding the treatment plan. I had a detailed  discussion with the patient regarding the risks of leaving against medical advice, and the patient expressed understanding and acceptance of the risk of worsening in condition or death.   While I do not agree with the patient's decision, the patient's thought process is goal-directed. The patient's speech is appropriate and mental status is normal; the patient has neither expressed nor demonstrated any suicidal ideation. The decision appears to be consistent with what limited knowledge I have of this patient's values. I informed the patient that we are always willing to care for him and he is welcome to return at any time    0618 Patient would like Percocet.  Aware that since he still has chest pain despite medication, he will need to be admitted   0541 Trop 9, delta zero   0531 Patient reassessed.  Patient reports that he usually gets improvement with Dilaudid 1 mg.  Aware that if he is still not pain-free after Dilaudid, will have to be admitted   0448 Repeat EKG reviewed by me with heart  rate 69, PR 172, QRS 96, QTc 4 2.  Sinus rhythm.  No ST elevation or depression.  No EKG changes compared to first EKG performed upon presentation of the patient.   0247 Preliminary read of CXR with no consolidation.   0220 EG reviewed by me with heart rate 94, PR 166, QRS 94, QT C4 47.  Sinus rhythm.  No acute changes compared to prior EKG on file from June 22, 2021     Blood work reviewed with no anemia, normal WBC. BNP 91. CMP with no AKI, gluc 287. No incraese AG, bicarb normal. Trop baseline 9, trop 3 hour 9, delta zero    Patient received pain medications and still reports chest pain. I recommended admission and was about to contact cardiology when the patient reports that he would like to be discharged immediately.  "Just get the IV out so I can leave". I had a lengthy discussion with the patient, his friend at bedside and with Lynita Lombard in the room. He is alert, has capacity, understands and verbalizes the risks to leave AMA. Form leave against medical advice signed by patient, myself and Lynita Lombard.  The patient has currently no regular cardiology follow up and reports that his cardiologist at [facility] retired. He is agreeable to have follow up with cardiology at [facility]. Referral to cardiology sent.    Patient educated on diagnosis(es); he states understanding and agreement with plan of care.  Reasons to return to the ED were reviewed in detail.     Disposition: Against Medical Advice    Condition on Discharge Stable    Diagnosis/Diagnoses:  Chest pain, unspecified type    Maude Leriche, MD MPH  Attending physician  Emergency department  [facility]    This Emergency Department patient encounter note was created using voice-recognition software and in real time during the ED visit.

## 2021-06-27 NOTE — ED Triage Note (Signed)
Ambulatory to ED. A+O, speaking full and complete sentences.    Pt reports that he was walking to his cousin's house, began experiencing left sided chest pain with radiation to the left arm and up to the jaw.  Pt reports pain with exertion and with rest.  Pt reports extensive cardiac history, including surgery and MI's. Pt reports last cardiac surgery was in 2012, and had an MI 2021.   Pt denies taking his daily ASA today.    Pt reports his cardiologist is at [facility], currently retired, and is now waiting to see who his new cardiologist will be.

## 2021-06-27 NOTE — Narrator Note (Signed)
RN and MD to bedside. MD explains we cannot let patient leave if patient is still experiencing chest pain. Patient states, "alright whatever well I want to go home so I am leaving." MD explains AMA form. Patient verbalizes understanding, states, "just take this IV out and I'll come back if I need to." AMA form signed by patient, RN and MD. Patient ambulated out of ED independently.

## 2021-06-27 NOTE — Narrator Note (Signed)
Pt endorses being a 'tough stick'

## 2021-06-27 NOTE — Narrator Note (Signed)
Patient Disposition  Patient education for diagnosis, medications, activity, diet and follow-up.  Patient left ED 7:02 AM.  Patient rep received written instructions.    Interpreter to provide instructions: No    Patient belongings with patient: YES    Have all existing LDAs been addressed? Yes    Have all IV infusions been stopped? N/A    Destination: AMA

## 2021-06-27 NOTE — Narrator Note (Signed)
Blood obtained, no IV access.

## 2021-07-01 LAB — BMP (EXT)
Anion Gap (EXT): 15 (ref 5–15)
BUN (EXT): 10 mg/dL (ref 7–23)
CO2 (EXT): 21 mmol/L — ABNORMAL LOW (ref 24–32)
CalciumCalcium (EXT): 9.6 mg/dL (ref 8.7–10.7)
Chloride (EXT): 100 mmol/L (ref 97–110)
Creatinine (EXT): 0.77 mg/dL (ref 0.60–1.30)
Glucose (EXT): 435 mg/dL — ABNORMAL HIGH (ref 70–99)
Potassium (EXT): 3.7 mmol/L (ref 3.5–5.3)
Sodium (EXT): 136 mmol/L (ref 135–145)
eGFR - Creat CKD-EPI (EXT): >90 mL/min/{1.73_m2} (ref >=90)

## 2021-07-01 LAB — RESPIRATORY PANEL BASIC CARE EVERYWHERE
COVID-19 CARE EVERYWHERE: NOT DETECTED
INFLUENZA A PCR CARE EVERYWHERE: NOT DETECTED
INFLUENZA B PCR CARE EVERYWHERE: NOT DETECTED
RSV PCR CARE EVERYWHERE: NOT DETECTED

## 2021-07-01 LAB — EKG

## 2021-07-05 LAB — LAB EXTERNAL RESULT UNMAPPED
ADENOVIRUS CARE EVERYWHERE: NOT DETECTED
CHLAMYDIA PNEUMONIA CARE EVERYWHERE: NOT DETECTED
CORONA(229E, HKU1, NL63, OC43) CARE EVERYWHERE: DETECTED — AB
COVID-19 CARE EVERYWHERE: NOT DETECTED
HUMAN METAPNEUMOVIRUS CARE EVERYWHERE: NOT DETECTED
HUMAN RHINOVIRUS/ENTEROVIRUS CARE EVERYWHERE: NOT DETECTED
INFLUENZA A CARE EVERYWHERE: NOT DETECTED
INFLUENZA A H1 CARE EVERYWHERE: NOT DETECTED
INFLUENZA A H1-2009 CARE EVERYWHERE: NOT DETECTED
INFLUENZA A H3 CARE EVERYWHERE: NOT DETECTED
INFLUENZA B CARE EVERYWHERE: NOT DETECTED
MYCOPLASMA PNEUMONIAE CARE EVERYWHERE: NOT DETECTED
PARAINFLUENZA 1 CARE EVERYWHERE: NOT DETECTED
PARAINFLUENZA 2 CARE EVERYWHERE: NOT DETECTED
PARAINFLUENZA 3 CARE EVERYWHERE: NOT DETECTED
PARAINFLUENZA 4 CARE EVERYWHERE: NOT DETECTED
RSV A CARE EVERYWHERE: NOT DETECTED
RSV B CARE EVERYWHERE: NOT DETECTED

## 2021-07-09 ENCOUNTER — Encounter (HOSPITAL_BASED_OUTPATIENT_CLINIC_OR_DEPARTMENT_OTHER): Payer: Self-pay

## 2021-07-09 ENCOUNTER — Emergency Department
Admission: EM | Admit: 2021-07-09 | Discharge: 2021-07-09 | Discharge status: Against Medical Advice | Payer: No Typology Code available for payment source | Attending: Emergency Medicine | Admitting: Emergency Medicine

## 2021-07-09 DIAGNOSIS — R0789 Other chest pain: Secondary | ICD-10-CM | POA: Diagnosis not present

## 2021-07-09 DIAGNOSIS — I252 Old myocardial infarction: Secondary | ICD-10-CM | POA: Diagnosis not present

## 2021-07-09 DIAGNOSIS — R079 Chest pain, unspecified: Secondary | ICD-10-CM | POA: Diagnosis present

## 2021-07-09 DIAGNOSIS — I2510 Atherosclerotic heart disease of native coronary artery without angina pectoris: Secondary | ICD-10-CM | POA: Insufficient documentation

## 2021-07-09 DIAGNOSIS — R110 Nausea: Secondary | ICD-10-CM

## 2021-07-09 LAB — CMP (EXT)
ALT/SGPT (EXT): 86 U/L — ABNORMAL HIGH (ref 10–50)
AST/SGOT (EXT): 64 U/L — ABNORMAL HIGH (ref 15–41)
Albumin (EXT): 3.9 g/dL (ref 3.5–5.2)
Alkaline Phosphatase (EXT): 69 U/L (ref 40–130)
Anion Gap (EXT): 10 mmol/L (ref 3–17)
BUN (EXT): 11 mg/dL (ref 6–20)
Bilirubin, Total (EXT): 0.4 mg/dL (ref 0.0–1.2)
CO2 (EXT): 27 mmol/L (ref 22–32)
CalciumCalcium (EXT): 9.5 mg/dL (ref 8.9–10.3)
Chloride (EXT): 101 mmol/L (ref 98–107)
Creatinine (EXT): 0.92 mg/dL (ref 0.6–1.3)
GFR Estimated (Calc) (EXT): 106 mL/min/{1.73_m2} (ref 60–128)
Globulin (EXT): 3 g/dL (ref 1.9–4.1)
Glucose (EXT): 311 mg/dL — ABNORMAL HIGH (ref 65–99)
Potassium (EXT): 4.2 mmol/L (ref 3.6–5.1)
Protein (EXT): 6.9 g/dL (ref 6.1–8.1)
Sodium (EXT): 138 mmol/L (ref 136–145)

## 2021-07-09 MED ORDER — ASPIRIN 81 MG PO CHEW
324.00 mg | CHEWABLE_TABLET | Freq: Once | ORAL | Status: AC
Start: 2021-07-09 — End: 2021-07-09
  Administered 2021-07-09: 324 mg via ORAL
  Filled 2021-07-09: qty 4

## 2021-07-09 MED ORDER — ONDANSETRON 4 MG PO TBDP
4.0000 mg | ORAL_TABLET | Freq: Once | ORAL | Status: DC
Start: 2021-07-09 — End: 2021-07-09
  Filled 2021-07-09: qty 1

## 2021-07-09 NOTE — ED Provider Notes (Signed)
ED nursing record was reviewed.  Prior records as available electronically through the Epic record were reviewed.     Patient's mode of arrival was by Self.  Arrival time:     Chief complaint: Chest Pain          Triage Vital Signs:  ED Triage Vitals [07/09/21 1715]   ED Triage Vitals Brief Group      Temp 97.6 F      Pulse 77      Resp 17      BP 155/82      SpO2 96 %      Pain Score 6        Triage Documentation     Enedina Finner, RN 07/09/2021 17:18             Patient c/o left sided chest pain that started while he was asleep. Pt seen at a different facility for left sided chest pain yesterday with neg w/u and "they sent me home". Pt c/o left sided chest pain that radiates down left arm and left jaw pain. Patient speaking in full complete sentences. Pt interacting minimally with this RN when asked questions.            HPI:  46 year old male patient presents for left-sided chest pain.  States his symptoms began after waking up from a nap about 25 minutes ago.  He describes left-sided chest pain radiating to his left arm and down his arm, and also bit to his jaw.  He reports that he is having some nausea which happens to him when he is in pain.    Tells me that he is supposed to be on aspirin but did not take it today, ran out.    He told his cousin that he was having chest pain and his cousin told him to come into the ER to be evaluated.    Chart was reviewed, patient has been seen at multiple hospitals over the past several months for chest pain, with negative troponin testing.  He often requests opiates and leaves AGAINST MEDICAL ADVICE if he does not receive them.  He unfortunately does have a significant cardiac history, with prior CABG.  Yesterdays visit at [facility] emergency department was reviewed, normal lab testing.          Past Medical History:  Past Medical History:  No date: Acute myocardial infarction, unspecified site, episode of   care unspecified  No date: CAD (coronary artery  disease)  No date: Hypercholesteremia  No date: MI (myocardial infarction) ([place])  No date: Mitral valve prolapse    Immunization History:    There is no immunization history on file for this patient.    Past Surgical History:  Past Surgical History:  2007: CABG W/ARTERIAL GRAFT SINGLE ARTERIAL GRAFT  No date: PR MINIMALLY INVASIVE DIRECT CO    Medications:  Current Facility-Administered Medications   Medication   . ondansetron (ZOFRAN-ODT) disintegrating tablet 4 mg     Current Outpatient Medications   Medication Sig   . METOPROLOL TARTRATE OR None Entered   . ASPIRIN 81 MG OR TABS daily   . LIPITOR 20 MG OR TABS daily       Social History:  Social History    Tobacco Use      Smoking status: Every Day        Packs/day: 0.25        Types: Cigarettes      Smokeless tobacco: Never  Alcohol use: Not on file      Family History:  History reviewed.  No pertinent family history.      Allergies:  Review of Patient's Allergies indicates:   Ketorolac trometham*    Hives   Morphine and related    Anaphylaxis   Nitroglycerin           Hives, Other (See Comments)    Comment:"syncope and hives"             Patient states "passess out"             States drops his BP "too much"   Morphine sulfate-na*        Comment:Pt. States can take dilaudid and other narcotics   Motrin [ibuprofen]      Hives   Oxycodone               Other (See Comments)    Comment:States makes him feel funny   Toradol [ketorolac *    Other (See Comments)   Tramadol                Other (See Comments)   Nitrogen                Hives    Physical Exam:   GENERAL: No acute distress, well nourished  HEAD: Atraumatic  EYES: No evidence of trauma. Sclerae anicteric.  Pupils equal.  ENT: Wearing a facemask  LUNGS: Clear to auscultation bilaterally.  No significant wheezing.  Respirations unlabored.  HEART: Regular rate & rhythm.   EXTREMITIES/MSK: No obvious deformities or trauma.  Warm and well perfused.  SKIN: Warm and dry, no rash  NEUROLOGIC: Alert and oriented  x3; moves all extremities well; speaking in clear fluent sentences. Sensation intact to light touch throughout.  PSYCHIATRIC: Appropriate, cooperative.      Labs:  No results found for this visit on 07/09/21 (from the past 24 hour(s)).    Vital Signs During ED Stay:   07/09/21  1715   BP: 155/82   Pulse: 77   Resp: 17   Temp: 97.6 F   SpO2: 96%   Weight: 113.4 kg (250 lb)       Meds Given during ED visit:  Medications   ondansetron (ZOFRAN-ODT) disintegrating tablet 4 mg (has no administration in time range)   aspirin chewable tablet 324 mg (324 mg Oral Given 07/09/21 1740)       Imaging Results & Interpretation:  No orders to display    My EKG Interpretation: Normal sinus rhythm at 77.  Normal axis.  Normal intervals.  No ST elevations or depressions.    ED Course, Medical Decision-making, Disposition:  46 year old male patient presents for evaluation of left-sided chest pain.  On examination he has strong pulses left upper extremity, is in no distress, normal heart and lung sounds, normal vital signs.  EKG obtained, no ST elevations or depressions.    Discussed with him that given his history it certainly was important to rule out MI with troponin testing but given his prior history I was hopeful that his visit today would again be reassuring.  I reviewed with him about giving him a full dose aspirin, and providing medicine for nausea.  I offered him stomach medicines (pepcid/maalox) but he states his stomach is not bothering him.    Nursing staff administered the aspirin, but was unable to to obtain lab testing as the patient had removed his cardiac leads and decided to walk out  of the ER.  Discharge paperwork not provided      Disposition:  [place]    Condition:  Stable    Diagnosis/Diagnoses:  Chest pain, unspecified type    Windy Kalata, MD  07/09/2021  Dept of Emergency [place]    This Emergency Department patient encounter note was created using voice-recognition software and in  real time during the ED visit. Please excuse any typographical errors that have not yet been reviewed and corrected.

## 2021-07-09 NOTE — ED Triage Note (Signed)
Patient c/o left sided chest pain that started while he was asleep. Pt seen at a different facility for left sided chest pain yesterday with neg w/u and "they sent me home". Pt c/o left sided chest pain that radiates down left arm and left jaw pain. Patient speaking in full complete sentences. Pt interacting minimally with this RN when asked questions.

## 2021-07-09 NOTE — Narrator Note (Signed)
This RN at the bedside to place iv, obtain blood work and medicate patient.     Patient noted to be getting dressed and removing cardiac leads and stickers.   Patient stating that he is leaving.   Patient not wanting to speak to provider and unable to be convinced to stay for blood work.     Patient willing to take ASA prior to leaving.   Patient leaving at this time with all belongings.   Dr. Ramond Craver made aware.   Patient ambulating with a steady gait.

## 2021-07-11 LAB — EKG

## 2021-07-26 LAB — BMP (EXT)
Anion Gap (EXT): 3 — ABNORMAL LOW (ref 5–15)
BUN (EXT): 10 mg/dL (ref 7–18)
CO2 (EXT): 27 mmol/L (ref 22–30)
CalciumCalcium (EXT): 9.6 mg/dL (ref 8.5–10.1)
Chloride (EXT): 104 mmol/L (ref 98–107)
Creatinine (EXT): 0.87 mg/dL (ref 0.60–1.30)
Glucose (EXT): 347 mg/dL — ABNORMAL HIGH (ref 70–99)
Potassium (EXT): 3.9 mmol/L (ref 3.5–5.3)
Sodium (EXT): 134 mmol/L — ABNORMAL LOW (ref 136–145)
eGFR - Creat CKD-EPI (EXT): >90 mL/min/{1.73_m2} (ref >=90)

## 2021-07-26 LAB — APTT CARE EVERYWHERE: APTT CARE EVERYWHERE: 24.7 s (ref 24.5–31.1)

## 2021-08-05 LAB — BMP (EXT)
Anion Gap (EXT): 10 mmol/L (ref 2–15)
BUN (EXT): 12 mg/dL (ref 7–24)
CO2 (EXT): 21 mmol/L — ABNORMAL LOW (ref 24–32)
CalciumCalcium (EXT): 9.1 mg/dL (ref 8.5–10.5)
Chloride (EXT): 103 mmol/L (ref 98–110)
Creatinine (EXT): 1.3 mg/dL (ref 0.6–1.3)
Glucose (EXT): 356 mg/dL — ABNORMAL HIGH (ref 70–118)
Sodium (EXT): 134 mmol/L — ABNORMAL LOW (ref 135–146)
eGFR - Creat MDRD (EXT): 60 mL/min/BSA (ref >=60)

## 2021-08-19 ENCOUNTER — Inpatient Hospital Stay
Admit: 2021-08-19 | Discharge: 2021-08-19 | Discharge status: Against Medical Advice | Payer: MEDICAID | Attending: Emergency Medicine

## 2021-08-19 ENCOUNTER — Emergency Department: Payer: MEDICAID | Primary: Family

## 2021-08-19 DIAGNOSIS — R079 Chest pain, unspecified: Secondary | ICD-10-CM

## 2021-08-19 LAB — CBC WITH DIFFERENTIAL
Basophils %: 0.2 %
Basophils Absolute: 0.01 10*3/uL (ref 0.00–0.22)
Eosinophils %: 1.6 %
Eosinophils Absolute: 0.08 10*3/uL (ref 0.00–0.50)
Hematocrit: 44.1 % (ref 32.0–53.0)
Hemoglobin: 14.2 g/dL (ref 13.0–17.5)
Immature Granulocytes %: 0.2 %
Immature Granulocytes Absolute: 0.01 10*3/uL (ref 0.00–0.10)
Lymphocyte %: 38.4 %
Lymphocytes Absolute: 1.88 10*3/uL (ref 0.70–4.00)
MCH: 29.6 pg (ref 26.0–34.0)
MCHC: 32.2 g/dL (ref 31.0–37.0)
MCV: 92.1 fL (ref 80.0–100.0)
MPV: 10.7 fL (ref 9.1–12.4)
Monocytes %: 5.5 %
Monocytes Absolute: 0.27 10*3/uL — ABNORMAL LOW (ref 0.36–0.83)
NRBC %: 0 % (ref 0.0–0.0)
NRBC Absolute: 0 10*3/uL (ref 0.00–2.00)
Neutrophil %: 54.1 %
Neutrophils Absolute: 2.65 10*3/uL (ref 1.50–7.95)
Platelets: 222 10*3/uL (ref 150–400)
RBC: 4.79 M/uL (ref 4.20–5.90)
RDW-CV: 12.4 % (ref 11.5–14.5)
RDW-SD: 42.5 fL (ref 35.0–51.0)
WBC: 4.9 10*3/uL (ref 4.0–11.0)

## 2021-08-19 LAB — COMPREHENSIVE METABOLIC PANEL
ALT: 120 U/L — ABNORMAL HIGH (ref 0–55)
AST: 70 U/L — ABNORMAL HIGH (ref 6–42)
Albumin: 4 g/dL (ref 3.2–5.0)
Alkaline phosphatase: 67 U/L (ref 30–130)
Anion Gap: 11 mmol/L (ref 3–14)
BUN: 9 mg/dL (ref 6–24)
Bilirubin, total: 0.5 mg/dL (ref 0.2–1.2)
CO2 (Bicarbonate): 23 mmol/L (ref 20–32)
Calcium: 9.6 mg/dL (ref 8.5–10.5)
Chloride: 101 mmol/L (ref 98–110)
Creatinine: 0.76 mg/dL (ref 0.55–1.30)
Glucose: 272 mg/dL — ABNORMAL HIGH (ref 70–139)
Potassium: 3.9 mmol/L (ref 3.6–5.2)
Protein, total: 7.9 g/dL (ref 6.0–8.4)
Sodium: 135 mmol/L (ref 135–146)
eGFRcr: 114 mL/min/{1.73_m2} (ref >=60)

## 2021-08-19 LAB — PROTIME-INR
INR: 1.03
Protime: 11.9 s (ref 9.7–14.0)

## 2021-08-19 LAB — TROPONIN I: Troponin I: 0.01 ng/mL (ref <0.03)

## 2021-08-19 MED ORDER — acetaminophen (Tylenol) tablet 975 mg
325 | Freq: Once | ORAL | Status: DC
Start: 2021-08-19 — End: 2021-08-19

## 2021-08-19 MED FILL — ACETAMINOPHEN 325 MG TABLET: 325 325 mg | ORAL | Qty: 3

## 2021-08-19 NOTE — ED Notes (Signed)
Pt states he would like to leave, explained risks of leaving prior to second troponin. Pt verbalizes understanding, refusing to sign AMA paperwork. MD Walker Kehr aware and to bedside. IVR removed.      Laqueta Carina, RN  08/19/21 [phone]

## 2021-08-19 NOTE — ED Notes (Addendum)
Assumed care of pt in room T1, pt reports onset of left sided chest pain 1 hour prior to arrival. Pt with 3 previous MIs hx of CABG. Pt reports pain feels similar to previous cardiac events. Pt allergic to nitro, 4 ASA given in route. Pt placed on cardiac monitor, EKG completed. IV placed, labs sent. Pt speaking in clear and full sentences. VS updated. Awaits provider eval.      Laqueta CarinaKiara Shelita Steptoe, RN  08/19/21 1635

## 2021-08-19 NOTE — ED Provider Notes (Addendum)
EMERGENCY DEPARTMENT ENCOUNTER    ATTENDING NOTE          Date of service: 08/19/2021  3:19 PM    No chief complaint on file.      Nursing notes reviewed and agreed with.    HISTORY OF PRESENT ILLNESS:    [name] is a 46 y.o. adult who  has a past medical history of Heart attack (CMS/HCC) and Hypertension. cad /cabg /iddm who presents with c/o chest pain acute onset 1.5 hours ago L sided dull achy w/ n/v no sob / ha /dizziness.   States similar sx in the past,did have cabg in 2002, has been referred to cardiology at [facility] but has not yet seen them; last stress test /cardiac cath was a few years ago.  reports allergy to NTG,  Toradol, ibuprofen ; did not take anyhting for pain pta     Review of Systems   Gastrointestinal: Negative for abdominal pain, nausea and vomiting.   Neurological: Negative for dizziness and headaches.   Psychiatric/Behavioral: Negative for substance abuse.     See PA note     PAST MEDICAL HISTORY / PROBLEM LIST    Past Medical History:   Diagnosis Date   . Heart attack (CMS/HCC)     x3   . Hypertension     pt denies having high BP       Patient Active Problem List   Diagnosis   . Chest pain   . Hypokalemia   . Chest pain, unspecified type       PAST SURGICAL HISTORY    Past Surgical History:   Procedure Laterality Date   . ARTERIAL BYPASS SURGERY      x2       MEDICATIONS     Discharge Medication List as of 08/19/2021  5:08 PM      CONTINUE these medications which have NOT CHANGED    Details   aspirin 81 mg capsule Take 81 mg by mouth in the morning., Starting Sun 01/05/2018, Historical Med      atorvastatin (Lipitor) 10 mg tablet Take 40 mg by mouth in the morning., Starting Sun 01/05/2018, Historical Med      CLINDAMYCIN HCL ORAL Take by mouth., Historical Med      metoprolol tartrate (Lopressor) 25 mg tablet Take 25 mg by mouth in the morning and 25 mg in the evening., Starting Mon 03/11/2017, Historical Med              ALLERGIES    Allergies   Allergen Reactions   . Ibuprofen  Anaphylaxis, Hives, Itching and Rash     Other reaction(s): Anaphylactic reaction, Unknown, Urticaria  anaphylaxis  anaphylaxis     . Ketorolac Anaphylaxis, Hives and Rash     Other reaction(s): ITCHY HIVES, Unknown, Urticaria  Converted from Generic Allergy: Ketorolac  Anaphylaxis   Anaphylaxis   Tolerates ASA     . Nitroglycerin      hives   . Tramadol Anaphylaxis, Angioedema, Hives and Rash     Other reaction(s): THROAT CLOSES, Unknown, Urticaria  Patient confirmed that he can take Percocet, Hydromorphone and Morphine. [name], Pharmacy  anaphylaxis  anaphylaxis         SOCIAL HISTORY    Social History     Tobacco Use   . Smoking status: Every Day     Types: Cigarettes   . Smokeless tobacco: Never   Vaping Use   . Vaping status: Not on file   Substance  Use Topics   . Alcohol use: Never     Social History     Substance and Sexual Activity   Drug Use Never       FAMILY HISTORY    No family history on file.    Physical Exam  Triage vital signs: BP 123/67   Pulse 78   Temp 36 C (96.8 F)   Resp 26   SpO2 97%   CONSTITUTIONAL: Awake Alert  Comfortable  No apparent distress  HEAD :  NCAT   EYES: Conjunctivae clear.  Anicteric.  EOMI.   EARS, NOSE, MOUTH, AND THROAT: Oral mucosal membranes moist.  No swelling.  No stridor.  NECK: Trachea midline.   Supple no swelling.   RESPIRATORY: Normal respiratory effort.  Lung fields are clear bilaterally. No rales Nestor Ramp Sima Matas.   CARDIOVASCULAR: RRR No pulse deficits   GASTROINTESTINAL: Soft, nontender and nondistended . No rebound or guarding  MUSCULOSKELETAL: No asymmetry.  No calf swelling or tenderness.  NEUROLOGIC: Moves all extremities equally. No facial asymmetry.  SKIN : Warm dry well perfused  PSYCHIATRIC:  Normal mood.       DATA  EKG    EKG interpreted by me and shows:    Sinus Rhythm, rate of 83; no acute ST or Tw changes, QTC 413    Cardiac Monitor    Rhythm strip interpreted by me and shows:    Sinus rhythm, rate of 80, no ectopy    LABS    Labs  Reviewed   COMPREHENSIVE METABOLIC PANEL - Abnormal       Result Value    Sodium 135      Potassium 3.9      Chloride 101      CO2 (Bicarbonate) 23      Anion Gap 11      BUN 9      Creatinine 0.76      eGFRcr 114      Glucose 272 (*)     Fasting? Unknown      Calcium 9.6      AST 70 (*)     ALT 120 (*)     Alkaline phosphatase 67      Protein, total 7.9      Albumin 4.0      Bilirubin, total 0.5     CBC WITH DIFFERENTIAL - Abnormal    WBC 4.9      RBC 4.79      Hemoglobin 14.2      Hematocrit 44.1      MCV 92.1      MCH 29.6      MCHC 32.2      RDW-CV 12.4      RDW-SD 42.5      Platelets 222      MPV 10.7      Neutrophil % 54.1      Lymphocyte % 38.4      Monocytes % 5.5      Eosinophils % 1.6      Basophils % 0.2      Immature Granulocytes % 0.2      NRBC % 0.0      Neutrophils Absolute 2.65      Lymphocytes Absolute 1.88      Monocytes Absolute 0.27 (*)     Eosinophils Absolute 0.08      Basophils Absolute 0.01      Immature Granulocytes Absolute 0.01      NRBC Absolute 0.00     PROTIME-INR -  Normal    Protime 11.9      INR 1.03     TROPONIN I - Normal    Troponin I 0.01     CBC W/DIFF    Narrative:     The following orders were created for panel order CBC and differential.  Procedure                               Abnormality         Status                     ---------                               -----------         ------                     CBC w/ Differential[[id number]]          Abnormal            Final result                 Please view results for these tests on the individual orders.          MEDICAL DECISION MAKING & ED COURSE    Differential Dx includes :  Acute MI; atypical chest pain, stable angina, unstable Trangina, aortic dissection, costochondritis,     Pertinent labs and imaging studies reviewed.    ED Course as of 08/21/21 1636   Sat Aug 19, 2021   1636 Glucose(!): 272         Nursing notes reviewed .  Previous records reviewed.     EKG with no acute abnormalities, initial troponin  negative.  I recommended the patient stay in the ED for serial EKG and troponin, given his significant past cardiac history, CABG, but he refused, left AGAINST MEDICAL ADVICE, understands and able to recall risks of possible MI, other serious adverse effects, states will follow-up with PCP and cardiologist.    The patient has decided to leave against medical advice.  I have spent significant time with the patient and I am unable to convince them to stay.  The risks of leaving against medical advice have been explained to the patient and include the potential for their condition to worsen, permanent disability, and death.  The patient understands these risks.  The patient is clinically sober, free from distracting injury, appears to have intact insight and judgment and reason and in my opinion has the capacity to make decisions.  The patient was treated to the extent that they would allow and knows that they may return for care at any time.          EXTERNAL RECORDS REVIEWED  Multiple previous ED visits for CP at multiple different hospitals dates including  3/18, 3/17, 3/8, 2/19, 2/18, 2/13, has left AMA multiple times    INDEPENDENT INTERPRETATIONS  Ekg, cxr     TESTS/TREATMENT/HOSPITALIZATION CONSIDERED  Serial EKG, troponin, admission to the hospital, however patient left the ED prior to any of this.    CHRONIC CONDITIONS  CAD; HTN     ACUTE CONDITIONS  Chest pain       CLINICAL IMPRESSION:    Chest pain         Cheyene Hamric A. Walker Kehr, MD  August 21, 2021, 4:36  PM  - - - - - - - - - - - - - - - - - - - - - - - - - - - - - - - - - - - - - - - - - - - - - - - - - - - - - - - - - - - - - - - -    Details of any procedures performed during this visit are documented separately in Procedure notes.   Portions of this electronic health record were generated with voice-recognition software.        Manisha Cancel A. Walker Kehr, MD  08/21/21 1636       Angello Chien A. Walker Kehr, MD  08/21/21 [phone]

## 2021-08-19 NOTE — ED Triage Notes (Signed)
Patient presents via EMS from hotel, patient was ambulating had onset chest pain 10/10, +dizziness, denies SOB. Symptoms similar to last MI, +hx MI/stents/CABG. GCS 15. EKG no new changes. Metformin changed to insulin. BS 296. Patient is allergic to nitro (hives). Given 4 baby ASA. 20 G to Left forearm.

## 2021-09-15 ENCOUNTER — Inpatient Hospital Stay
Admit: 2021-09-15 | Discharge: 2021-09-16 | Disposition: A | Discharge status: Home / Self Care | Payer: MEDICAID | Attending: Emergency Medicine

## 2021-09-15 DIAGNOSIS — K0889 Other specified disorders of teeth and supporting structures: Secondary | ICD-10-CM

## 2021-09-15 MED ORDER — oxyCODONE (Roxicodone) immediate release tablet 5 mg
5 | Freq: Once | ORAL | Status: AC
Start: 2021-09-15 — End: 2021-09-15
  Administered 2021-09-16: 5 mg via ORAL

## 2021-09-15 NOTE — ED Provider Notes (Signed)
EMERGENCY DEPARTMENT ENCOUNTER    ATTENDING NOTE    Date of service: 09/15/2021  6:46 PM    No chief complaint on file.    [facility] PAT reviwed  Nursing notes reviewed     HISTORY OF PRESENT ILLNESS:    46 year old male with a history of coronary artery disease status post CABG history of type 2 diabetes hypertension who states that 3 days ago he had extraction of multiple teeth of his mandibular region.  He states that he was given Tylenol by his PCP but he has been taking it without much relief.  He is requesting pain relief.  Reports no fevers no difficulty swallowing no drooling    REVIEW OF SYSTEMS    Constitutional: no fevers  ENT: no sore throat  Skin: no rash    PAST MEDICAL HISTORY / PROBLEM LIST    Past Medical History:   Diagnosis Date   . Heart attack (CMS/HCC)     x3   . Hypertension     pt denies having high BP       Patient Active Problem List   Diagnosis   . Chest pain   . Hypokalemia   . Chest pain, unspecified type       PAST SURGICAL HISTORY    Past Surgical History:   Procedure Laterality Date   . ARTERIAL BYPASS SURGERY      x2       MEDICATIONS     Previous Medications    ASPIRIN 81 MG CAPSULE    Take 81 mg by mouth in the morning.    ATORVASTATIN (LIPITOR) 10 MG TABLET    Take 40 mg by mouth in the morning.    CLINDAMYCIN HCL ORAL    Take by mouth.    METOPROLOL TARTRATE (LOPRESSOR) 25 MG TABLET    Take 25 mg by mouth in the morning and 25 mg in the evening.        ALLERGIES    Allergies   Allergen Reactions   . Ibuprofen Anaphylaxis, Hives, Itching and Rash     Other reaction(s): Anaphylactic reaction, Unknown, Urticaria  anaphylaxis  anaphylaxis     . Ketorolac Anaphylaxis, Hives and Rash     Other reaction(s): ITCHY HIVES, Unknown, Urticaria  Converted from Generic Allergy: Ketorolac  Anaphylaxis   Anaphylaxis   Tolerates ASA     . Nitroglycerin      hives   . Tramadol Anaphylaxis, Angioedema, Hives and Rash     Other reaction(s): THROAT CLOSES, Unknown, Urticaria  Patient confirmed that he  can take Percocet, Hydromorphone and Morphine. [name], Pharmacy  anaphylaxis  anaphylaxis     . Codeine        SOCIAL HISTORY    Social History     Tobacco Use   . Smoking status: Every Day     Types: Cigarettes   . Smokeless tobacco: Never   Vaping Use   . Vaping status: Not on file   Substance Use Topics   . Alcohol use: Never     Social History     Substance and Sexual Activity   Drug Use Never       FAMILY HISTORY    No family history on file.    PHYSICAL EXAM    ED Triage Vitals [09/15/21 1842]   Temp Pulse Resp BP   36.4 C (97.6 F) 96 18 (!) 146/88      SpO2 Temp Source Heart Rate Source Patient Position  98 % Temporal Monitor Sitting      BP Location FiO2 (%)     Left arm --        Nontoxic-appearing head is normocephalic atraumatic there is no obvious swelling no trismus there are multiple extraction site of the lower and the right upper molar region.  There is no obvious erythema no pharyngeal injection no mandibular or cervical lymphadenopathy      LABS    Labs Reviewed - No data to display     RADIOLOGY    X-rays contemporaneously visualized and interpreted by me.      No orders to display       MEDICAL DECISION MAKING     Pertinent labs and imaging studies reviewed.  46 year old male who has had multiple teeth extraction 3 days ago presents to the ED with increasing pain that is not improved with Tylenol.  [facility] reviewed reveals that the patient received multiple prescriptions with multiple providers of hydrocodone and oxycodone on the 27th 26 24th 18/11 and fifth of this month.  I told him that I am unable to give him a prescription that he should follow-up with his primary care doctor    ED COURSE    Diagnoses as of 09/15/21 2031   Dentalgia           PROCEDURES    Procedures        CLINICAL IMPRESSION:      Dentalgia    FINAL DIAGNOSIS:    1. Tobie Lords, MD  09/15/21 2031

## 2021-09-15 NOTE — ED Triage Notes (Signed)
Patient presents after numerous teeth extractions a few days ago, taking Tylenol but is not helping the pain, unable to take Motrin. Pain 10/10.

## 2021-09-15 NOTE — ED Notes (Signed)
46 year old male with history of CAD s/p bypass (2007), presents with teeth pain after multiple tooth extractions that he states occurred a few days ago. Pt states using analgesics such as motrin without any relief from pain. Airway patent, no respiratory distress and pt resting calmly on stretcher.       Gwenlyn FoundLeo Akhilesh Sassone, RN  09/15/21 2018

## 2021-09-15 NOTE — Discharge Instructions (Signed)
Follow-up with your dentist for any refill of your narcotic pain medication

## 2021-09-15 NOTE — ED Provider Notes (Signed)
[facility] PA NOTE: 46 y/o male with hx of CAD s/p CABG, IDDM and HTN who presents to the ED with dental pain. He reports having multiple mandibular teeth extracted as well as maxillary molar extracted 3 days ago at outpatient dental office. He now reports severe pain not relieved by Tylenol. He notes that dentist mentioned prescribing Vicodin but did not at the initial appointment and the dentist is now out of the office for 3 days.   Exam: Appears uncomfortable, evidence of multiple recent mandibular dental extractions as well as maxillary molar extraction. Some anterior facial swelling. No bleeding.        Eulah CitizenSonya O'Neill, [place]  09/15/21 [phone]

## 2021-09-16 MED FILL — OXYCODONE 5 MG TABLET: 5 5 mg | ORAL | Qty: 1

## 2021-10-11 LAB — LIPID PROFILE (EXT)
Cholesterol (EXT): 242 mg/dL — ABNORMAL HIGH (ref <200)
HDL Cholesterol (EXT): 36 mg/dL (ref 35–100)
LDL Cholesterol (EXT): 165 mg/dL — ABNORMAL HIGH (ref 50–129)
NON HDL Cholesterol (EXT): 206 mg/dL
Risk Factor (EXT): 6.7 — ABNORMAL HIGH (ref 0.0–5.0)
Triglycerides (EXT): 207 mg/dL — ABNORMAL HIGH (ref 40–150)

## 2021-10-11 LAB — BMP (EXT)
Anion Gap (EXT): 9 mmol/L (ref 3–17)
BUN (EXT): 10 mg/dL (ref 8–25)
CO2 (EXT): 26 mmol/L (ref 23–32)
CalciumCalcium (EXT): 9.6 mg/dL (ref 8.5–10.5)
Chloride (EXT): 105 mmol/L (ref 98–108)
Creatinine (EXT): 0.78 mg/dL (ref 0.60–1.50)
GFR Estimated (Calc) (EXT): 113 mL/min/{1.73_m2} (ref >59)
Glucose (EXT): 172 mg/dL — ABNORMAL HIGH (ref 70–110)
Potassium (EXT): 4.2 mmol/L (ref 3.4–5.0)
Sodium (EXT): 140 mmol/L (ref 135–145)

## 2021-10-11 LAB — HEMOGLOBIN A1C
Estimated Average Glucose mg/dL (INT/EXT): 226 mg/dL
HEMOGLOBIN A1C % (INT/EXT): 9.5 % — ABNORMAL HIGH (ref 4.3–5.6)

## 2021-10-27 DIAGNOSIS — K0889 Other specified disorders of teeth and supporting structures: Secondary | ICD-10-CM
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
## 2021-10-27 NOTE — Progress Notes (Signed)
10/28/21  4:12 PM EDT    Notified by [facility] that [facility] not [facility].  Contacted the pharmacy who confirms this.  The original prescription was canceled and the patient was written for oxycodone which is the only narcotic have available at this time.  Patient is aware and will go to the pharmacy.

## 2021-10-27 NOTE — ED Triage Notes (Signed)
Pt reports that he has been taking 1 gram of tylenol every 6 hours for the past 4-5 days after getting all his bottom teeth pulled and one upper wisdom tooth on the right side one week ago. Pt reports that he has been spitting out purulent discharge.

## 2021-10-28 ENCOUNTER — Inpatient Hospital Stay
Admit: 2021-10-28 | Discharge: 2021-10-28 | Disposition: A | Discharge status: Home / Self Care | Payer: PRIVATE HEALTH INSURANCE | Attending: Emergency Medical Services

## 2021-10-28 DIAGNOSIS — K0889 Other specified disorders of teeth and supporting structures: Secondary | ICD-10-CM

## 2021-10-28 MED ORDER — HYDROCODONE-ACETAMINOPHEN 5-325 MG PO TABS
5-325 MG | ORAL_TABLET | Freq: Three times a day (TID) | ORAL | 0 refills | Status: DC | PRN
Start: 2021-10-28 — End: 2021-10-28

## 2021-10-28 MED ORDER — HYDROCODONE-ACETAMINOPHEN 5-325 MG PO TABS
5-325 MG | ORAL | Status: AC
Start: 2021-10-28 — End: 2021-10-28
  Administered 2021-10-28: 06:00:00 1 via ORAL

## 2021-10-28 MED ORDER — PENICILLIN V POTASSIUM 500 MG PO TABS
500 MG | ORAL_TABLET | Freq: Four times a day (QID) | ORAL | 0 refills | Status: AC
Start: 2021-10-28 — End: 2021-11-07

## 2021-10-28 MED ORDER — OXYCODONE HCL 5 MG PO TABS
5 MG | ORAL_TABLET | Freq: Four times a day (QID) | ORAL | 0 refills | Status: AC | PRN
Start: 2021-10-28 — End: 2021-10-31

## 2021-10-28 MED ORDER — PENICILLIN V POTASSIUM 500 MG PO TABS
500 MG | Freq: Four times a day (QID) | ORAL | Status: DC
Start: 2021-10-28 — End: 2021-10-28
  Administered 2021-10-28: 06:00:00 500 mg via ORAL

## 2021-10-28 MED FILL — PENICILLIN V POTASSIUM 500 MG PO TABS: 500 MG | ORAL | Qty: 1

## 2021-10-28 MED FILL — HYDROCODONE-ACETAMINOPHEN 5-325 MG PO TABS: 5-325 MG | ORAL | Qty: 1

## 2021-10-28 NOTE — ED Notes (Signed)
Reviewed discharge instructions with the patient    Patient verbalized understanding and agreement with the discharge plans.    Patient ambulated out of the department with a steady gait       Jaynie Crumble, RN  10/28/21 0244

## 2021-10-28 NOTE — Discharge Instructions (Signed)
Complete antibiotics as instructed, call local dentist or primary dentist in [place] for close follow-up

## 2021-10-31 LAB — HEP C AB WITH REFLEX QUANT (EXT)
HEP C Ab. S/CO RATIO (EXT): >11 — ABNORMAL HIGH (ref <1.00)
HEPATITIS C ANTIBODY (EXT): REACTIVE — AB

## 2021-10-31 LAB — CMP (EXT)
ALT/SGPT (EXT): 78 U/L — ABNORMAL HIGH (ref 10–40)
AST/SGOT (EXT): 40 U/L (ref 10–40)
Albumin (EXT): 3.8 g/dL (ref 3.5–4.8)
Alkaline Phosphatase (EXT): 53 U/L (ref 30–115)
Anion Gap (EXT): 7 (ref 5–15)
BUN (EXT): 9 mg/dL (ref 7–23)
Bilirubin, Total (EXT): 0.4 mg/dL (ref 0.3–1.2)
CO2 (EXT): 27 mmol/L (ref 24–32)
CalciumCalcium (EXT): 9.2 mg/dL (ref 8.7–10.7)
Chloride (EXT): 103 mmol/L (ref 97–110)
Creatinine (EXT): 0.94 mg/dL (ref 0.60–1.30)
Glucose (EXT): 175 mg/dL — ABNORMAL HIGH (ref 70–99)
Potassium (EXT): 3.8 mmol/L (ref 3.5–5.3)
Protein (EXT): 7.4 g/dL (ref 6.0–8.0)
Sodium (EXT): 137 mmol/L (ref 135–145)
eGFR - Creat CKD-EPI (EXT): >90 mL/min/{1.73_m2} (ref >=60)

## 2021-10-31 LAB — HEPATITIS C ANTIBODY CARE EVERYWHERE
HEPATITIS C ANTIBODY CARE EVERYWHERE: REACTIVE — AB
SIGNAL TO CUT OFF CARE EVERYWHERE: >11 — ABNORMAL HIGH (ref <1.00)

## 2021-11-02 ENCOUNTER — Emergency Department
Admission: EM | Admit: 2021-11-02 | Discharge: 2021-11-03 | Disposition: A | Discharge status: Home / Self Care | Payer: No Typology Code available for payment source | Attending: Emergency Medicine | Admitting: Emergency Medicine

## 2021-11-02 ENCOUNTER — Other Ambulatory Visit: Payer: Self-pay

## 2021-11-02 ENCOUNTER — Emergency Department (HOSPITAL_BASED_OUTPATIENT_CLINIC_OR_DEPARTMENT_OTHER): Payer: No Typology Code available for payment source

## 2021-11-02 DIAGNOSIS — R079 Chest pain, unspecified: Secondary | ICD-10-CM | POA: Insufficient documentation

## 2021-11-02 DIAGNOSIS — Z20822 Contact with and (suspected) exposure to covid-19: Secondary | ICD-10-CM | POA: Diagnosis not present

## 2021-11-02 LAB — LAB EXTERNAL RESULT UNMAPPED
HEPATITIS C RNA PCR CARE EVERYWHERE: 381000 [IU]/mL — ABNORMAL HIGH
HEPATITIS C RNA PCR CARE EVERYWHERE: 5.58 {Log} — ABNORMAL HIGH

## 2021-11-02 NOTE — ED Triage Note (Signed)
Pt started having chest pain this evening, feeling a bit short of breath. Pt states that he had had 2 CABG's and 3 MI in the past. No c/o nausea/vomiting or pain radiating.   Patient smokes 1/2 pack a day, drinks socially and does not do drugs.

## 2021-11-02 NOTE — ED Provider Notes (Signed)
The patient was seen primarily by me.             HPI:    This is a 46 year old male patient presenting with chest pain.  Gradual onset.  40 minutes ago.  Feels like pressure.  Location left chest.  Radiating down his left arm.  Reports nausea.  Denies vomiting.  Reports he takes 4 chewable aspirin every day.  Denies fevers and chills.      ROS: Pertinent positives were reviewed as per the HPI above.       Past Medical History/Problem list:  Past Medical History:  No date: Acute myocardial infarction, unspecified site, episode of   care unspecified  No date: CAD (coronary artery disease)  No date: Hypercholesteremia  No date: MI (myocardial infarction) ([place])  No date: Mitral valve prolapse  Patient Active Problem List:     Controlled Medication request     Acquired absence of other specified parts of digestive tract     Acute hepatitis C virus infection without hepatic coma     Anomalous right coronary artery     Asthma with acute exacerbation     Coronary atherosclerosis of native coronary artery     Congenital anomaly of artery     Essential hypertension     History of drug abuse ([place])     History of nonadherence to medical treatment     Postsurgical aortocoronary bypass status     Kidney stones     Major depressive disorder, recurrent episode, moderate ([place])     Narcotic drug use     Mixed hyperlipidemia     Type 2 diabetes mellitus ([place])     Tobacco dependence syndrome        Past Surgical History: Past Surgical History:  2007: CABG W/ARTERIAL GRAFT SINGLE ARTERIAL GRAFT  No date: PR MINIMALLY INVASIVE DIRECT CO      Medications:   No current facility-administered medications for this encounter.     Current Outpatient Medications   Medication Sig   . METOPROLOL TARTRATE OR None Entered   . ASPIRIN 81 MG OR TABS daily   . LIPITOR 20 MG OR TABS daily         Social History: Social History    Tobacco Use      Smoking status: Every Day        Packs/day: 0.25        Types: Cigarettes      Smokeless tobacco:  Never    Vaping Use      Vaping status: Not on file    Alcohol use: Not on file        Allergies:  Review of Patient's Allergies indicates:   Ketorolac trometham*    Hives   Morphine and related    Anaphylaxis   Nitroglycerin           Hives, Other (See Comments)    Comment:"syncope and hives"             Patient states "passess out"             States drops his BP "too much"   Morphine sulfate-na*        Comment:Pt. States can take dilaudid and other narcotics   Motrin [ibuprofen]      Hives   Oxycodone               Other (See Comments)    Comment:States makes him feel funny   Toradol [ketorolac *  Other (See Comments)   Tramadol                Other (See Comments)   Nitrogen                Hives      Physical Exam:  ED Triage Vitals [11/02/21 2323]   ED Triage Vitals Brief Group      Temp 97.5 F      Pulse 80      Resp (!) 21      BP 144/76      SpO2 97 %      Pain Score 3        GENERAL: No acute distress.   SKIN:  Warm & Dry.  HEAD: Normocephalic, atraumatic. No periorbital edema.  External ear normal b/l.  NECK: trachea midline, no stridor.  LUNGS:  Clear to auscultation bilaterally. No wheezes, rales, rhonchi. No respiratory distress, no increased work of breathing.  CARDIOVASCULAR:  Regular rate.   ABDOMEN: Nondistended.   MUSCULOSKELETAL:  No obvious deformities.    NEUROLOGIC: Alert and oriented.  Moves all extremities well.  PSYCHIATRIC:  Appropriate for age, time of day, and situation.    Medications Given:  Medications   ondansetron (ZOFRAN) injection 4 mg (4 mg Intravenous Given 11/03/21 0057)       Labs:  Labs Reviewed   CBC, PLATELET & DIFFERENTIAL - Abnormal; Notable for the following components:       Result Value    RED BLOOD CELL COUNT 4.54 (*)     All other components within normal limits   BASIC METABOLIC PANEL - Abnormal; Notable for the following components:    Glucose Random 176 (*)     All other components within normal limits   COVID-19 INPATIENT - Abnormal; Notable for the following  components:    COVID-19 INPATIENT SARS-CoV-2 (*)     All other components within normal limits   TROPONIN T HS BASELINE   HOLD BLUE TOP TUBE       Imaging:  No orders to display      ED Course and Medical Decision-making:    The patient is a  46 year old male presenting with chest pain    Plan for assessment: Completed chart review.  Patient with multiple ED visits for chest pain throughout [facility].  Many charts raise concern for opioid seeking behavior and that patient will frequently leave AGAINST MEDICAL ADVICE.    Chart review from ED visits most recently in [facility] system on 2/19 and on 2/7.  Patient declined admission and chose to leave the emergency department.    EKG with no STEMI.  Initial troponin 11.  Plan for serial troponins however when offered tylenol for initial analgesia pt asked to leave ED.  Pt agreeable to receive zofran.    The patient is refusing to stay for further management and chooses to leave at this time. After a discussion of my recommended treatment plan and the reasonable alternatives, they will leave against medical advice despite my objections and my attempts to address any concerns they had regarding further care. They are awake, alert, and fully oriented. They state understanding and acceptance of the risks of leaving against medical advice, including death, coma, or permanent disability. As they possess full capacity to refuse further care, and there are no grounds to hold them against their will, I am legally required to allow them to leave despite my concerns for their well-being. The patient understands that they  may return to this emergency room or any emergency room at any time should they decide to allow appropriate medical care and that they should also arrange for close follow up with their primary care provider and any relevant specialists.      ED Course as of 11/07/21 2033   Fri Nov 03, 2021   1914 Discussed plan to start analgesia with Tylenol and to treat his nausea  with Zofran.  Patient reporting he no longer wants to be in the emergency department, requesting IV to be removed and to be discharged.     Thu Nov 02, 2021   2347 Per my independent interpretation, EKG shows normal sinus rhythm.  Vent rate 77 bpm.  Normal QRS and PR interval.  No prolonged qt.  Normal qrs axis.  No STEMI.  T wave inversion in aVL, V2.               Condition: Improved and Stable    Disposition:  Discharged to home    Diagnosis/Diagnoses:  Chest pain in adult    Portions of this chart may have been created with Dragon voice recognition software. Occasional wrong-word or "sound-alike" substitutions may have occurred due to the inherent limitations of voice recognition software. Please read the chart carefully and recognize, using context, where these substitutions have occurred.

## 2021-11-03 ENCOUNTER — Encounter (HOSPITAL_BASED_OUTPATIENT_CLINIC_OR_DEPARTMENT_OTHER): Payer: Self-pay

## 2021-11-03 DIAGNOSIS — J45901 Unspecified asthma with (acute) exacerbation: Secondary | ICD-10-CM | POA: Insufficient documentation

## 2021-11-03 DIAGNOSIS — N200 Calculus of kidney: Secondary | ICD-10-CM | POA: Insufficient documentation

## 2021-11-03 LAB — BMP (EXT)
Anion Gap (EXT): 12 mmol/L (ref 10–22)
BUN (EXT): 10 mg/dL (ref 7–18)
CO2 (EXT): 26 mmol/L (ref 21–32)
CalciumCalcium (EXT): 9.8 mg/dL (ref 8.5–10.5)
Chloride (EXT): 102 mmol/L (ref 98–107)
Creatinine (EXT): 0.9 mg/dL (ref 0.7–1.2)
Glucose (EXT): 176 mg/dL — ABNORMAL HIGH (ref 74–160)
Potassium (EXT): 3.8 mmol/L (ref 3.5–5.1)
Sodium (EXT): 140 mmol/L (ref 136–145)
eGFR - Creat CKD-EPI (EXT): >60 mL/min (ref >60)

## 2021-11-03 LAB — CBC, PLATELET & DIFFERENTIAL
ABSOLUTE BASO COUNT: 0 10*3/uL (ref 0.0–0.1)
ABSOLUTE EOSINOPHIL COUNT: 0.1 10*3/uL (ref 0.0–0.8)
ABSOLUTE IMM GRAN COUNT: 0.01 10*3/uL (ref 0.00–0.10)
ABSOLUTE LYMPH COUNT: 2.4 10*3/uL (ref 0.6–5.9)
ABSOLUTE MONO COUNT: 0.4 10*3/uL (ref 0.2–1.4)
ABSOLUTE NEUTROPHIL COUNT: 3.9 10*3/uL (ref 1.6–8.3)
ABSOLUTE NRBC COUNT: 0 10*3/uL (ref 0.0–0.0)
BASOPHIL %: 0.4 % (ref 0.0–1.2)
EOSINOPHIL %: 2 % (ref 0.0–7.0)
HEMATOCRIT: 42.4 % (ref 40.1–51.0)
HEMOGLOBIN: 13.9 g/dL (ref 13.7–17.5)
IMMATURE GRANULOCYTE %: 0.1 % (ref 0.0–1.0)
LYMPHOCYTE %: 34.8 % (ref 15.0–54.0)
MEAN CORP HGB CONC: 32.8 g/dL (ref 31.0–37.0)
MEAN CORPUSCULAR HGB: 30.6 pg (ref 26.0–34.0)
MEAN CORPUSCULAR VOL: 93.4 fl (ref 80.0–100.0)
MEAN PLATELET VOLUME: 10.5 fL (ref 8.7–12.5)
MONOCYTE %: 6.3 % (ref 4.0–13.0)
NEUTROPHIL %: 56.4 % (ref 40.0–75.0)
NRBC %: 0 % (ref 0.0–0.0)
PLATELET COUNT: 259 10*3/uL (ref 150–400)
RBC DISTRIBUTION WIDTH STD DEV: 43.8 fL (ref 35.1–46.3)
RED BLOOD CELL COUNT: 4.54 M/uL — ABNORMAL LOW (ref 4.60–6.10)
WHITE BLOOD CELL COUNT: 7 10*3/uL (ref 4.0–11.0)

## 2021-11-03 LAB — BASIC METABOLIC PANEL
ANION GAP: 12 mmol/L (ref 10–22)
BUN (UREA NITROGEN): 10 mg/dL (ref 7–18)
CALCIUM: 9.8 mg/dL (ref 8.5–10.5)
CARBON DIOXIDE: 26 mmol/L (ref 21–32)
CHLORIDE: 102 mmol/L (ref 98–107)
CREATININE: 0.9 mg/dL (ref 0.7–1.2)
ESTIMATED GLOMERULAR FILT RATE: >60 mL/min (ref >60)
Glucose Random: 176 mg/dL — ABNORMAL HIGH (ref 74–160)
POTASSIUM: 3.8 mmol/L (ref 3.5–5.1)
SODIUM: 140 mmol/L (ref 136–145)

## 2021-11-03 LAB — HOLD BLUE TOP TUBE

## 2021-11-03 LAB — COVID-19 INPATIENT: COVID-19 INPATIENT: POSITIVE

## 2021-11-03 LAB — TROPONIN T HS BASELINE: TROPONIN T HS BASELINE: 11 ng/L (ref 0–15)

## 2021-11-03 MED ORDER — ONDANSETRON HCL 4 MG/2ML IJ SOLN
4.00 mg | Freq: Once | INTRAMUSCULAR | Status: AC
Start: 2021-11-03 — End: 2021-11-03
  Administered 2021-11-03: 4 mg via INTRAVENOUS
  Filled 2021-11-03: qty 2

## 2021-11-03 NOTE — Narrator Note (Signed)
Patient Disposition  Patient education for diagnosis, medications, activity, diet and follow-up.  Patient left ED 1:11 AM.  Patient rep received written instructions.    Interpreter to provide instructions: No    Patient belongings with patient: YES    Have all existing LDAs been addressed? Yes    Have all IV infusions been stopped? N/A    Destination: Discharged to home

## 2021-11-03 NOTE — ED Nursing Notes (Signed)
0045 pt refused vitals and wants to go home. Discharge paperwork and zofran brought to patient. Pt left d/c papers

## 2021-11-03 NOTE — Discharge Instructions (Signed)
Please follow up with your primary doctor in 2-3 days for continued evaluation.  Please discuss scheduling a stress test or other additional cardiac testing.     Return to ER immediately for fevers or chills, worsening pain, change in your pain, worsening in status, shortness of breath, difficulty breathing, or any concerns.

## 2021-11-03 NOTE — Narrator Note (Signed)
Peripheral IV inserted; labs drawn and sent

## 2021-11-04 LAB — EKG

## 2021-11-21 ENCOUNTER — Emergency Department: Admit: 2021-11-21 | Payer: PRIVATE HEALTH INSURANCE

## 2021-11-21 ENCOUNTER — Inpatient Hospital Stay: Admit: 2021-11-21 | Discharge: 2021-11-21 | Payer: PRIVATE HEALTH INSURANCE | Attending: Urgent Care

## 2021-11-21 DIAGNOSIS — R079 Chest pain, unspecified: Secondary | ICD-10-CM

## 2021-11-21 LAB — BASIC METABOLIC PANEL
Anion Gap: 10 mmol/L (ref 5–15)
BUN: 9 MG/DL (ref 6–20)
Bun/Cre Ratio: 13 (ref 12–20)
CO2: 25 mmol/L (ref 22–29)
Calcium: 9.4 MG/DL (ref 8.6–10.0)
Chloride: 104 mmol/L (ref 98–107)
Creatinine: 0.68 MG/DL (ref 0.67–1.17)
Est, Glom Filt Rate: >60 mL/min/{1.73_m2} (ref >60)
Glucose: 281 mg/dL — ABNORMAL HIGH (ref 74–109)
Potassium: 4.7 mmol/L (ref 3.5–5.1)
Sodium: 139 mmol/L (ref 136–145)

## 2021-11-21 LAB — CBC WITH AUTO DIFFERENTIAL
Absolute Eos #: 0.1 10*3/uL (ref 0.0–0.5)
Absolute Immature Granulocyte: 0 10*3/uL (ref 0.00–0.03)
Absolute Lymph #: 1.8 10*3/uL (ref 1.2–3.7)
Absolute Mono #: 0.4 10*3/uL (ref 0.2–0.8)
Basophils Absolute: 0 10*3/uL (ref 0.0–0.1)
Basophils: 1 %
Eosinophils %: 2 %
Hematocrit: 41.1 % (ref 40.1–51.0)
Hemoglobin: 13.7 g/dL (ref 13.7–17.5)
Immature Granulocytes: 0 %
Lymphocytes: 42 %
MCH: 30.9 PG (ref 25.6–32.2)
MCHC: 33.3 g/dL (ref 32.2–35.5)
MCV: 92.6 FL (ref 80.0–95.0)
MPV: UNDETERMINED FL (ref 9.4–12.4)
Monocytes: 10 %
Nucleated RBCs: 0 PER 100 WBC (ref 0–0.02)
Platelet Comment: ADEQUATE
Platelets: 195 10*3/uL (ref 150–400)
RBC Comment: NORMAL
RBC: 4.44 M/uL — ABNORMAL LOW (ref 4.51–5.93)
RDW: 12.4 % (ref 11.6–14.4)
Seg Neutrophils: 45 %
Segs Absolute: 1.9 10*3/uL (ref 1.6–6.1)
WBC: 4.2 10*3/uL (ref 4.0–10.0)
nRBC: 0 10*3/uL

## 2021-11-21 LAB — TROPONIN: Troponin T: 9.6 ng/L (ref 0.0–22.0)

## 2021-11-21 LAB — BRAIN NATRIURETIC PEPTIDE: NT Pro-BNP: 43 PG/ML (ref 0–125)

## 2021-11-21 MED ORDER — HYDROMORPHONE HCL 1 MG/ML IJ SOLN
1 MG/ML | INTRAMUSCULAR | Status: AC
Start: 2021-11-21 — End: 2021-11-21
  Administered 2021-11-21: 21:00:00 0.5 mg via INTRAVENOUS

## 2021-11-21 MED FILL — HYDROMORPHONE HCL 1 MG/ML IJ SOLN: 1 MG/ML | INTRAMUSCULAR | Qty: 0.5

## 2021-11-21 NOTE — ED Triage Notes (Signed)
The Pt presents with CP for 20 minutes prior to arrival. The Pt states that he is having L sided CP that radiates down his L arm and to his jaw and back. The Pt has a Hx 3 MI's and 2 CABG's.
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
## 2021-11-21 NOTE — ED Notes (Signed)
I was walking by the Pt's room when he asked me to take his IV out because he was leaving. The Pt explained that the PA and an RN talked to him about a previous issue at a previous hospital. I attempted to convince the Pt to stay but he was adamant he was leaving, ha had already called a cab. I advised the Pt that he was leaving AMA but the Pt was persistent that he was leaving and nothing would change his mind.     Irven Coe  11/21/21 1813

## 2021-11-22 LAB — EKG 12-LEAD
Atrial Rate: 71 ms
EKG I-40 FRONT AXIS: -1 deg
EKG I-40 HORIZONTAL AXIS: 53 deg
EKG P DURATION: 128 ms
EKG P FRONT AXIS: 48 deg
EKG P HORIZONTAL AXIS: -3 deg
EKG Q ONSET: 499 ms
EKG QRS AXIS: 42 deg
EKG QRS HORIZONTAL AXIS: -66 deg
EKG QRSD INTERVAL: 103 ms
EKG QTCB: 414 ms
EKG QTCF: 403 ms
EKG RR INTERVAL: 845 ms
EKG S-T FRONT AXIS: 92 deg
EKG S-T HORIZONTAL AXIS: 81 deg
EKG T HORIZONTAL AXIS: 58 deg
EKG T WAVE AXIS: 82 deg
EKG T-40 FRONT AXIS: 102 deg
EKG T-40 HORIZONTAL AXIS: -82 deg
Heart Rate: 71 {beats}/min
P-R Interval: 171 ms
Q-T Interval: 381 ms

## 2021-12-05 LAB — BMP (EXT)
Anion Gap (EXT): 11 mmol/L (ref 3–17)
BUN (EXT): 14 mg/dL (ref 9–23)
CO2 (EXT): 25 mmol/L (ref 20–31)
CalciumCalcium (EXT): 10 mg/dL (ref 8.7–10.4)
Chloride (EXT): 103 mmol/L (ref 95–106)
Creatinine (EXT): 0.78 mg/dL (ref 0.50–1.30)
GFR Estimated (Calc) (EXT): 113 mL/min/{1.73_m2} (ref >60)
Glucose (EXT): 247 mg/dL — ABNORMAL HIGH (ref 74–106)
Potassium (EXT): 4.1 mmol/L (ref 3.5–5.2)
Sodium (EXT): 139 mmol/L (ref 136–145)

## 2021-12-05 LAB — LIPID PROFILE (EXT)
Cholesterol (EXT): 232 mg/dL — ABNORMAL HIGH (ref 140–200)
HDL Cholesterol (EXT): 35 mg/dL — ABNORMAL LOW (ref >40)
LDL Cholesterol, CALC (EXT): 160 mg/dL — ABNORMAL HIGH (ref 0–129)
NON HDL Cholesterol (EXT): 197 mg/dL
Risk Factor (EXT): 6.6 — ABNORMAL HIGH (ref 0–5)
Triglycerides (EXT): 183 mg/dL — ABNORMAL HIGH (ref 0–149)

## 2021-12-05 LAB — HEMOGLOBIN A1C
Estimated Average Glucose mg/dL (INT/EXT): 203 mg/dL
HEMOGLOBIN A1C % (INT/EXT): 8.7 % — ABNORMAL HIGH (ref 4.3–5.6)

## 2021-12-08 ENCOUNTER — Inpatient Hospital Stay
Admit: 2021-12-08 | Discharge: 2021-12-08 | Disposition: A | Discharge status: Home / Self Care | Payer: PRIVATE HEALTH INSURANCE

## 2021-12-08 DIAGNOSIS — K0889 Other specified disorders of teeth and supporting structures: Secondary | ICD-10-CM

## 2021-12-08 MED ORDER — CLINDAMYCIN HCL 300 MG PO CAPS
300 MG | ORAL_CAPSULE | Freq: Three times a day (TID) | ORAL | 0 refills | Status: AC
Start: 2021-12-08 — End: 2021-12-15

## 2021-12-08 MED ORDER — HYDROCODONE-ACETAMINOPHEN 5-325 MG PO TABS
5-325 MG | ORAL_TABLET | Freq: Two times a day (BID) | ORAL | 0 refills | Status: AC | PRN
Start: 2021-12-08 — End: 2021-12-14

## 2021-12-08 NOTE — Discharge Instructions (Addendum)
As discussed, you have been prescribed a short prescription of pain medication.  In between, you should be taking Tylenol, using ice packs for your pain as to not rely on opiate medication for this chronic issue.    Please make sure that you are contacting your dentist to be seen as soon as possible.  Alternatively, [facility] in [place] can see emergency cases should you need to be seen sooner.    I will also reach out to your PCP at [facility] in [place] to notify them that you are needing assistance with pain management.

## 2021-12-08 NOTE — ED Triage Notes (Signed)
Pt comes in with an area of swelling to the lower gums. He is trying to be fit for dentures, [place] a dentist [place] in [place], and his dentist needs him on antibiotics right now before he can see him
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
## 2021-12-08 NOTE — ED Notes (Signed)
Pt had all teeth upper and lower 2 weeks ago and still having pain in gums and not able to get his dentures yet due to the swelling.     Calton Golds, RN  12/08/21 1905

## 2021-12-14 LAB — APTT CARE EVERYWHERE: APTT CARE EVERYWHERE: 36 s (ref 27–37)

## 2021-12-14 LAB — PROTHROMBIN TIME CARE EVERYWHERE
INR CARE EVERYWHERE: 0.88 (ref 0.83–1.20)
PROTHROMBIN TIME CARE EVERYWHERE: 10.2 s (ref 9.2–13.5)

## 2021-12-14 LAB — TYPE AND SCREEN CARE EVERYWHERE
ABO/RH CARE EVERYWHERE: O POS
ANTIBODY SCREEN CARE EVERYWHERE: NEGATIVE

## 2021-12-14 LAB — HEPATITIS C ANTIBODY CARE EVERYWHERE: HEPATITIS C ANTIBODY CARE EVERYWHERE: REACTIVE — AB

## 2021-12-18 DIAGNOSIS — K0889 Other specified disorders of teeth and supporting structures: Secondary | ICD-10-CM

## 2021-12-18 NOTE — Discharge Instructions (Signed)
Follow up with your PCP   Return if worse or any new or worsening symptoms or complaints.

## 2021-12-18 NOTE — ED Progress Note (Signed)
PROVIDER IN ED TRIAGE    Brief Triage Note:     46 y.o. adult with PMH of CAD, MI, hypertension presents to triage as walk in for evaluation of dry socket. Patient had 2 teeth extracted x 2 weeks ago. He reports severe pain at extraction site. Denies fever/chills. Took 1000 mg tylenol 1.5 hours PTA. He is taking clindamycin.      Focused Triage Exam:    Awake, alert, conversant, in no acute distress.  Edentulous. Healing extraction sites of upper and lower left. There is small periapical abscess on left upper gum.       Arther Dames, [place]  12/18/21 2017

## 2021-12-18 NOTE — ED Provider Notes (Signed)
EMERGENCY DEPARTMENT ENCOUNTER    ATTENDING NOTE  Date of service: 12/18/2021  8:31 PM    HISTORY OF PRESENT ILLNESS:    [name] is a 46 y.o. adult who has a PMH of of CAD, MI, hypertension  who presents with dental pain. States he is on antibiotics and has an appointment with dental tomorrow. Is trialed many things for pain with minimal relief.     PHYSICAL EXAM   Vitals and nursing note reviewed.   General: Well appearing, nontoxic, no acute distress  Head: Normocephalic, atraumatic  ENT: Airway patent, no stridor, edentulous, no evidence of abscess, no evidence of infection  Pulm: No acute respiratory distress, no tachypnea  Cardiac: RRR  Abdomen: soft, nontender, nondistended; no guarding  Musculoskeletal: moving all extremities equally  Skin: No rash   Neuro: No focal deficit  Psych: Normal mood    LABS  Labs Reviewed - No data to display     RADIOLOGY  No orders to display       MEDICAL DECISION MAKING    Patient presenting with dental pain.  Here hemodynamically stable afebrile well-appearing no signs of acute infectious etiology.  Offered pain control and antibiotics which he continues to take.  Offered dental block for pain control.  Patient declined.  Patient requesting specifically narcotics.  I discussed this history and high concern with multiple short prescriptions for oxycodone across many different states and no acute indication for narcotics at this time given and optimization of nonnarcotic methods and patient became angry request to leave immediately.    EXTERNAL RECORDS REVIEWED  Reviewed detailed note from outside hospital ED in July regarding multiple visits for dental pain and short-term narcotic [place] [facility] [place]    CHRONIC CONDITIONS  of CAD, MI, hypertension, narcotic abuse    ACUTE CONDITIONS  Dental pain    CLINICAL IMPRESSION:    1. Pain, dental           Louie BunKathryn R Andreyah Natividad, MD  12/18/21 2113

## 2021-12-18 NOTE — ED Notes (Signed)
Pt here has complaints of dental pain that he states he's going to the clinic for. Pt point to two areas along his gums (teeth not present) that is mildly swollen, no discharge or eythemia present.      Reubin Milan, RN  12/18/21 2129

## 2021-12-18 NOTE — ED Triage Notes (Addendum)
Pt reports to the ED for 9/10 dental pain to the left upper and lower. Pt denies fever or chills. Pt reports taking 1 g of tylenol at 7 pm. Pt reports having his teeth pulled 2 weeks prior. Pt also reporting he has a dental clinic appointment at 9 am tomorrow.

## 2021-12-18 NOTE — Other (Signed)
Patient Education   Table of Contents     Dental Pain     To view videos and all your education online visit,   [email or web address]   or scan this QR code with your smartphone.   Access to this content will expire in one year.                    Dental Pain         Dental pain is often a sign that something is wrong with your teeth or gums. It is also something that can occur following dental treatment. If you have dental pain, it is important to contact your dental care provider, especially if the cause of the pain has not been determined. Dental pain may be of varying intensity and can be caused by many things, including:     Tooth decay (cavities or caries). Cavities are caused by bacteria that produce acids that irritate the nerve of your tooth, making it sensitive to air and hot or cold temperatures. This eventually causes discomfort or pain.     Abscess or infection. Once the bacteria reach the inner part of the tooth (pulp), a bacterial infection (dental abscess) can occur. Pus typically collects at the end of the root of a tooth.     Injury.     A crack in the tooth.     Gum recession exposing the root, and possibly the nerves, of a tooth.     Gum (periodontal)disease.     Abnormal grinding or clenching.     Poor or improper home care.     An unknown reason (idiopathic).      Your pain may be mild or severe. It may occur when you are:     Chewing.     Exposed to hot or cold temperatures.     Eating or drinking sugary foods or beverages, such as soda or candy.     Your pain may be constant, or it may come and go without cause.     Follow these instructions at home:   The following actions may help to lessen any discomfort that you are feeling before or after getting dental care.   Medicines     Take over-the-counter and prescription medicines only as told by your dental care provider.     If you were prescribed an antibiotic medicine, take it as told by your dental care provider. Do not  stop  taking the antibiotic even if you start to feel better.     Eating and drinking  Avoid foods or drinks that cause you pain, such as:     Very hot or very cold foods or drinks.     Sweet or sugary foods or drinks.     Managing pain and swelling        Ice can sometimes be used to reduce pain and swelling, especially if the pain is following dental treatment.     If directed, put ice on the painful area of your face. To do this:     Put ice in a plastic bag.     Place a towel between your skin and the bag.     Leave the ice on for 20 minutes, 2?3 times a day.     Remove the ice if your skin turns bright red. This is very important. If you cannot feel pain, heat, or cold, you have a greater risk of damage to the area.  Brushing your teeth     To keep your mouth and gums healthy, brush your teeth twice a day using a fluoride toothpaste.     Use a toothpaste made for sensitive teeth as directed by your dental care provider, especially if the root is exposed.     Always brush your teeth with a soft-bristled toothbrush. This will help prevent irritation to your gums.     General instructions     Floss at least once a day.    Do not  apply heat to the outside of the face.     Gargle with a mixture of salt and water 3?4 times a day or as needed. To make salt water, completely dissolve ??1 tsp (3?6 g) of salt in 1 cup (237 mL) of warm water.     Keep all follow-up visits. This is important.       Contact a dental care provider if:       You have any unexplained dental pain.     Your pain is not controlled with medicines.     Your symptoms get worse.     You have new symptoms.     Get help right away if:       You are unable to open your mouth.     You are having trouble breathing or swallowing.     You have a fever.     You notice that your face, neck, or jaw is swollen.     These symptoms may represent a serious problem that is an emergency. Do not wait to see if the symptoms will go away. Get medical help right away.  Call your local emergency services (911 in the [place] Do not drive yourself to the hospital.   Summary       Dental pain may be caused by many things, including tooth decay and infection.     Your pain may be mild or severe.     Take over-the-counter and prescription medicines only as told by your dental care provider.     Watch your dental pain for any changes. Let your dental care provider know if your symptoms get worse.     This information is not intended to replace advice given to you by your health care provider. Make sure you discuss any questions you have with your health care provider.     Document Released: 05/07/2005 Document Revised: 02/10/2020 Document Reviewed: 02/10/2020     Elsevier Patient Education ? 2023 Elsevier Inc.

## 2021-12-19 ENCOUNTER — Inpatient Hospital Stay
Admit: 2021-12-19 | Discharge: 2021-12-19 | Disposition: A | Discharge status: Home / Self Care | Payer: MEDICAID | Attending: Emergency Medicine

## 2021-12-25 LAB — BMP (EXT)
Anion Gap (EXT): 11 mmol/L (ref 7–17)
BUN (EXT): 8 mg/dL (ref 6–23)
CO2 (EXT): 26 mmol/L (ref 22–31)
CalciumCalcium (EXT): 8.8 mg/dL (ref 8.8–10.7)
Chloride (EXT): 103 mmol/L (ref 98–107)
Creatinine (EXT): 0.86 mg/dL (ref 0.50–1.20)
GFR Estimated (Calc) (EXT): 109 mL/min/{1.73_m2} (ref >59)
Glucose (EXT): 238 mg/dL — ABNORMAL HIGH (ref 70–100)
Potassium (EXT): 4.1 mmol/L (ref 3.4–5.1)
Sodium (EXT): 140 mmol/L (ref 136–145)

## 2021-12-28 DIAGNOSIS — M25562 Pain in left knee: Secondary | ICD-10-CM

## 2021-12-28 NOTE — ED Provider Notes (Signed)
Urgent Care Note  [facility]  Chief Complaint   Patient presents with   . left knee pain for 1 day        HPI: [name] is a 46 y.o. adult who presents for evaluation of left knee beginning today.  Denies any injury or trauma.  States "I just woke up like this ".  Denies any relief with the 1000 mg of Tylenol.  Denies paresthesia or focal weakness.  Denies any calf pain.  He feels the knee is swollen.  Denies any recent travel.    History obtained by: Patient    Past Medical History:   Diagnosis Date   . Diabetes mellitus (CMS/HCC)    . Heart attack (CMS/HCC)     x3   . Hypertension     pt denies having high BP      Patient Active Problem List   Diagnosis   . Chest pain   . Hypokalemia   . Chest pain, unspecified type        Past Surgical History:   Procedure Laterality Date   . ARTERIAL BYPASS SURGERY      x2       Social History     Tobacco Use   . Smoking status: Every Day     Types: Cigarettes   . Smokeless tobacco: Never   Vaping Use   . Vaping Use: Not on file   Substance Use Topics   . Alcohol use: Never   . Drug use: Never        No family history on file.     No current facility-administered medications on file prior to encounter.     Current Outpatient Medications on File Prior to Encounter   Medication Sig Dispense Refill   . aspirin 81 mg capsule Take 81 mg by mouth in the morning.     [name] atorvastatin (Lipitor) 10 mg tablet Take 40 mg by mouth in the morning.     [name] CLINDAMYCIN HCL ORAL Take by mouth.     . insulin glargine (Lantus) 100 unit/mL (3 mL) injection Inject 15 Units under the skin at bedtime.     . metoprolol succinate XL (Toprol-XL) 50 mg 24 hr tablet Take 25 mg by mouth twice daily.     . metoprolol tartrate (Lopressor) 25 mg tablet Take 25 mg by mouth in the morning and 25 mg in the evening.     . Ventolin HFA 90 mcg/actuation inhaler Inhale 2 puffs every 6 (six) hours if needed.          Allergies   Allergen Reactions   . Ibuprofen Anaphylaxis, Hives, Itching and  Rash     Other reaction(s): Anaphylactic reaction, Unknown, Urticaria  anaphylaxis  anaphylaxis     . Ketorolac Anaphylaxis, Hives and Rash     Other reaction(s): ITCHY HIVES, Unknown, Urticaria  Converted from Generic Allergy: Ketorolac  Anaphylaxis   Anaphylaxis   Tolerates ASA     . Nitroglycerin      hives   . Tramadol Anaphylaxis, Angioedema, Hives and Rash     Other reaction(s): THROAT CLOSES, Unknown, Urticaria  Patient confirmed that he can take Percocet, Hydromorphone and Morphine. [name], Pharmacy  anaphylaxis  anaphylaxis     . Codeine        Review of Systems   All other systems reviewed and are negative.       Vitals:    12/28/21 2145   BP: (!) 164/94  BP Location: Left arm   Patient Position: Sitting   Pulse: 106   Resp: 20   Temp: 36.4 C (97.6 F)   SpO2: 99%   Weight: 120.2 kg   Height: 1.93 m      Physical Exam  Vitals reviewed.   Constitutional:       General: [name] is not in acute distress.     Appearance: Normal appearance. [name] is not toxic-appearing.   HENT:      Head: Normocephalic and atraumatic.      Nose: Nose normal.      Mouth/Throat:      Mouth: Mucous membranes are moist.      Pharynx: Oropharynx is clear.   Eyes:      Conjunctiva/sclera: Conjunctivae normal.   Cardiovascular:      Comments: No cyanosis.  Pulmonary:      Effort: Pulmonary effort is normal.   Abdominal:      General: Abdomen is flat.   Musculoskeletal:         General: Normal range of motion.      Cervical back: Normal range of motion and neck supple. No rigidity.      Comments: Mild tenderness to the anterior aspect of the left knee.  Knee flexion / extension intact.  No calf tenderness or popliteal tenderness.  DP and PT pulses intact.  Sensations intact to light touch.  No erythema or significant edema.  Gait steady.   Lymphadenopathy:      Cervical: No cervical adenopathy.   Skin:     General: Skin is warm.   Neurological:      Mental Status: [name] is alert and oriented  to person, place, and time.   Psychiatric:         Mood and Affect: Mood normal.         Behavior: Behavior normal.          Labs Reviewed - No data to display     IMAGING:  No orders to display      ASSESSMENT/UC COURSE/PLAN: Patient is alert and oriented, vital signs are stable, afebrile and neurovascularly intact.  Patient had mild tenderness of the left knee anteriorly.  No popliteal or calf tenderness to suspect DVT.  No erythema or fever to suspect septic joint.  Patient was requesting pain medication.  I made patient aware that we do not carry narcotics in the urgent care.  I did review patient's EMR in which he has had multiple visits since June.  On 7/24 and 8/2 he had been evaluated in emergency departments and urgent cares who performed x-rays of the left knee which revealed no acute processes.  On 8/2 was given a prescription for Percocet. I made patient aware that he would not be getting any narcotics by me, but I offered him an x-ray in the urgent care but he declined.  Patient at this time got up and walked out of the urgent care with a steady gait prior to completion of evaluation and management.    DIAGNOSIS:   1. Acute pain of left knee       PATIENT DISPOSITION: Patient eloped from the urgent care prior to completion of evaluation and management.     Duwaine Maxin, [place]  12/28/21 2155

## 2021-12-28 NOTE — ED Notes (Signed)
[facility] when aware that we have no narcotic access here.     Elwanda BrooklynShelley Van Buren, RN  12/28/21 2155

## 2021-12-29 ENCOUNTER — Inpatient Hospital Stay: Admit: 2021-12-29 | Payer: MEDICAID

## 2022-01-05 LAB — UNMAPPED LAB RESULTS

## 2022-01-10 LAB — PROTHROMBIN TIME CARE EVERYWHERE: INR CARE EVERYWHERE: 1 (ref <1.3)

## 2022-01-21 DIAGNOSIS — R079 Chest pain, unspecified: Secondary | ICD-10-CM

## 2022-01-21 MED ORDER — sodium chloride 0.9 % flush 10 mL
INTRAMUSCULAR | Status: DC | PRN
Start: 2022-01-21 — End: 2022-01-22

## 2022-01-21 MED ORDER — HYDROmorphone (Dilaudid) injection 0.5 mg
0.5 | Freq: Once | INTRAMUSCULAR | Status: AC
Start: 2022-01-21 — End: 2022-01-21
  Administered 2022-01-22: 04:00:00 0.5 mg via INTRAVENOUS

## 2022-01-21 MED ORDER — morphine injection 4 mg
4 | Freq: Once | INTRAVENOUS | Status: AC
Start: 2022-01-21 — End: 2022-01-21
  Administered 2022-01-22: 02:00:00 4 mg via INTRAVENOUS

## 2022-01-21 MED ORDER — ondansetron (Zofran) injection 4 mg
4 | Freq: Once | INTRAMUSCULAR | Status: AC
Start: 2022-01-21 — End: 2022-01-21
  Administered 2022-01-22: 02:00:00 4 mg via INTRAVENOUS
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
## 2022-01-21 NOTE — ED Notes (Signed)
Patient requesting pain medication. States pain 10/10. Patient resting in bed with eyes closed. This RN had to yell and physically touch patient to wake him up.      Leafy Ro, RN  01/22/22 0001

## 2022-01-21 NOTE — ED Provider Notes (Signed)
Emergency Department Note  [facility]    Chief Complaint   Patient presents with   . Chest Pain       HISTORY OF PRESENT ILLNESS    History provided by:  Patient and EMS personnel  Language interpreter used: No      Patient is a 46 year old male with an extensive cardiac history notable for CAD/cardiac arrest, now post CABG x2 (last of which was in 2012) with most recent NSTEMI in 2020 per report of the patient, hypertension, dyslipidemia who is not currently followed by cardiologist (states cardiologist retired and he never established new care) presenting to ED today with acute chest pain.  Patient states that he was in his normal state of health.  He was sitting at [facility] when he had onset of chest tightness, shortness of breath and nausea.  He went outside to "catch his breath".  Symptoms persisted and this is when patient called 911.  Patient was brought to the ED.  He was given aspirin in route.  States that current pain is a 10/10 on pain scale.  Reports allergy to nitroglycerin-hives.  He did not take anything for the pain.  During the episode and currently, denies any lightheadedness.  He feels somewhat short of breath but denies diaphoresis, pain to the arms or jaw.  No abdominal pain, back pain, syncope or near syncope.  Patient denies recent travel, recent surgery, prolonged periods of venous stasis, unilateral leg swelling, pain behind the knees, hemoptysis, history of PE/DVT or collagenous hormone therapy.    PAST MEDICAL HISTORY  Patient Active Problem List   Diagnosis   . Chest pain   . Hypokalemia   . Chest pain, unspecified type     Past Medical History:   Diagnosis Date   . Diabetes mellitus (CMS/HCC)    . Heart attack (CMS/HCC)     x3   . Hypertension     pt denies having high BP     SURGICAL/FAM/SOCIAL HISTORY  Past Surgical History:   Procedure Laterality Date   . ARTERIAL BYPASS SURGERY      x2   . CHOLECYSTECTOMY         No family history on file.    Social History      Tobacco Use   . Smoking status: Every Day     Types: Cigarettes   . Smokeless tobacco: Never   Vaping Use   . Vaping Use: Not on file   Substance Use Topics   . Alcohol use: Never   . Drug use: Never       MEDICATIONS  Prior to Admission medications    Medication Sig Start Date End Date Taking? Authorizing Provider   aspirin 81 mg capsule Take 81 mg by mouth in the morning. 01/05/18   Historical Provider, MD   atorvastatin (Lipitor) 10 mg tablet Take 40 mg by mouth in the morning. 01/05/18   Historical Provider, MD   CLINDAMYCIN HCL ORAL Take by mouth.    Historical Provider, MD   insulin glargine (Lantus) 100 unit/mL (3 mL) injection Inject 15 Units under the skin at bedtime. 12/07/21   Historical Provider, MD   metoprolol succinate XL (Toprol-XL) 50 mg 24 hr tablet Take 25 mg by mouth twice daily. 12/07/21   Historical Provider, MD   metoprolol tartrate (Lopressor) 25 mg tablet Take 25 mg by mouth in the morning and 25 mg in the evening. 03/11/17   Historical Provider, MD   Ventolin HFA 90  mcg/actuation inhaler Inhale 2 puffs every 6 (six) hours if needed. 12/07/21   Historical Provider, MD      Allergies   Allergen Reactions   . Ibuprofen Anaphylaxis, Hives, Itching and Rash     Other reaction(s): Anaphylactic reaction, Unknown, Urticaria  anaphylaxis  anaphylaxis     . Ketorolac Anaphylaxis, Hives and Rash     Other reaction(s): ITCHY HIVES, Unknown, Urticaria  Converted from Generic Allergy: Ketorolac  Anaphylaxis   Anaphylaxis   Tolerates ASA     . Nitroglycerin      hives   . Tramadol Anaphylaxis, Angioedema, Hives and Rash     Other reaction(s): THROAT CLOSES, Unknown, Urticaria  Patient confirmed that he can take Percocet, Hydromorphone and Morphine. [name], Pharmacy  anaphylaxis  anaphylaxis     . Codeine      REVIEW OF SYSTEMS  Review of Systems   Respiratory: Positive for shortness of breath.    Cardiovascular: Positive for chest pain.     PHYSICAL EXAM  ED Triage Vitals [01/21/22 2150]   Temp  Pulse Resp BP   35.9 C (96.7 F) 76 18 137/73      SpO2 Temp Source Heart Rate Source Patient Position   97 % Temporal Monitor --      BP Location FiO2 (%)     -- --       Physical Exam  Vitals and nursing note reviewed.   Constitutional:       Appearance: Normal appearance.      Comments: Overall well-appearing male resting comfortably in the stretcher.  Reporting "10/10" pain although answering all questions appropriately and appears incredibly comfortable   HENT:      Head: Normocephalic and atraumatic.      Nose: Nose normal.      Mouth/Throat:      Pharynx: Oropharynx is clear.   Eyes:      Conjunctiva/sclera: Conjunctivae normal.   Cardiovascular:      Rate and Rhythm: Normal rate and regular rhythm.      Pulses: Normal pulses.      Heart sounds: Normal heart sounds.   Pulmonary:      Effort: Pulmonary effort is normal. No respiratory distress.      Breath sounds: Normal breath sounds. No wheezing, rhonchi or rales.   Musculoskeletal:         General: Normal range of motion.      Cervical back: Normal range of motion and neck supple. No tenderness.   Skin:     General: Skin is warm and dry.      Capillary Refill: Capillary refill takes less than 2 seconds.   Neurological:      General: No focal deficit present.      Mental Status: SYDNEY AZURE is alert and oriented to person, place, and time.   Psychiatric:         Mood and Affect: Mood normal.       TESTING RESULTS  PROCEDURES  Labs Reviewed   CBC WITH DIFFERENTIAL - Abnormal       Result Value    WBC 5.6      RBC 4.65      Hemoglobin 14.5      Hematocrit 42.6      MCV 91.6      MCH 31.2      MCHC 34.0      RDW-CV 12.5      RDW-SD 41.8      Platelets 237  MPV 11.1      Neutrophil % 58.8      Lymphocyte % 34.1      Monocytes % 5.2      Eosinophils % 1.1      Basophils % 0.4      Immature Granulocytes % 0.4      NRBC % 0.0      Neutrophils Absolute 3.32      Lymphocytes Absolute 1.92      Monocytes Absolute 0.29 (*)     Eosinophils Absolute 0.06       Basophils Absolute 0.02      Immature Granulocytes Absolute 0.02      NRBC Absolute 0.00     CBC W/DIFF    Narrative:     The following orders were created for panel order CBC and differential.  Procedure                               Abnormality         Status                     ---------                               -----------         ------                     CBC w/ Differential[[id number]]          Abnormal            Final result                 Please view results for these tests on the individual orders.   COMPREHENSIVE METABOLIC PANEL   TROPONIN I, HIGH SENSITIVITY   MAGNESIUM     XR CHEST 2 VIEWS    (Results Pending)     EKG: independently interpreted by myself (and attending physician Dr. Reyne Dumas) shows normal sinus rhythm.  Possible left atrial enlargement.  Ventricular rate of 80 bpm, PR 166, QRS 98, QT/QTc 364/419 ms.  I do appreciate deeper T wave inversion in lead aVL when compared to previous EKG, last performed 08/19/2021.  Notably, this T wave inversion was initially present, now deeper.  Otherwise no acute ST or T wave abnormalities or acute ischemic change.    Procedures  ED COURSE/MDM  Diagnoses as of 01/21/22 2253   Chest pain, unspecified type     ED Course & MDM   Medical Decision Making  This is a 46 year old male presenting to ED today with report of 10/10 chest pain.  Onset while at rest.  Patient does have an extensive cardiac history.  On my evaluation patient alert and oriented.  Vital signs stable, patient afebrile and neurovascularly intact.    IV was established by myself via ultrasound-guided IV.  Patient reported allergy to nitroglycerin.  He was given morphine 4 mg IV and Zofran 4 mg IV.  Above work-up was obtained.  At completion of my shift, we are awaiting laboratory studies and an x-ray.  Case will be signed out to attending physician, Dr.Sclar in order to follow-up on the above.  I do foresee that patient will be brought into the hospital given his extensive cardiac history  however final disposition as per attending.  Patient aware of this plan and in agreement.  All questions answered.    Chest pain, unspecified type: acute illness or injury  Risk  Prescription drug management.        DIAGNOSIS  Problem List Items Addressed This Visit        Other    Chest pain, unspecified type - Primary     CONDITION  Fair    DISPOSITION  Signed out to attending physician at 2200     Mauro Kaufmann, PA  01/21/22 2240       Mauro Kaufmann, PA  01/21/22 2253

## 2022-01-21 NOTE — ED Notes (Addendum)
A&O x3. Extensive cardiac history. Patient states "stabbing pressure" that started one hour PTA.[name]. "Feels similar to previous heart attack" Left sided pain chest pain radiating to left arm.. Denies N/V. Denies SOB. Placed on cardiac monitor.              Leafy RoLettisha Nixie Laube, RN  01/21/22 2228

## 2022-01-22 ENCOUNTER — Inpatient Hospital Stay
Admission: EM | Admit: 2022-01-22 | Discharge: 2022-01-22 | Discharge status: Against Medical Advice | Payer: MEDICAID | Admitting: Emergency Medicine

## 2022-01-22 ENCOUNTER — Inpatient Hospital Stay: Admit: 2022-01-22 | Payer: MEDICAID

## 2022-01-22 LAB — CBC WITH DIFFERENTIAL
Basophils %: 0.4 %
Basophils Absolute: 0.02 10*3/uL (ref 0.00–0.22)
Eosinophils %: 1.1 %
Eosinophils Absolute: 0.06 10*3/uL (ref 0.00–0.50)
Hematocrit: 42.6 % (ref 32.0–53.0)
Hemoglobin: 14.5 g/dL (ref 13.0–17.5)
Immature Granulocytes %: 0.4 %
Immature Granulocytes Absolute: 0.02 10*3/uL (ref 0.00–0.10)
Lymphocyte %: 34.1 %
Lymphocytes Absolute: 1.92 10*3/uL (ref 0.70–4.00)
MCH: 31.2 pg (ref 26.0–34.0)
MCHC: 34 g/dL (ref 31.0–37.0)
MCV: 91.6 fL (ref 80.0–100.0)
MPV: 11.1 fL (ref 9.1–12.4)
Monocytes %: 5.2 %
Monocytes Absolute: 0.29 10*3/uL — ABNORMAL LOW (ref 0.36–0.83)
NRBC %: 0 % (ref 0.0–0.0)
NRBC Absolute: 0 10*3/uL (ref 0.00–2.00)
Neutrophil %: 58.8 %
Neutrophils Absolute: 3.32 10*3/uL (ref 1.50–7.95)
Platelets: 237 10*3/uL (ref 150–400)
RBC: 4.65 M/uL (ref 4.20–5.90)
RDW-CV: 12.5 % (ref 11.5–14.5)
RDW-SD: 41.8 fL (ref 35.0–51.0)
WBC: 5.6 10*3/uL (ref 4.0–11.0)

## 2022-01-22 LAB — COMPREHENSIVE METABOLIC PANEL
ALT: 123 U/L — ABNORMAL HIGH (ref 0–55)
AST: 54 U/L — ABNORMAL HIGH (ref 6–42)
Albumin: 3.6 g/dL (ref 3.2–5.0)
Alkaline phosphatase: 80 U/L (ref 30–130)
Anion Gap: 4 mmol/L (ref 3–14)
BUN: 12 mg/dL (ref 6–24)
Bilirubin, total: 0.4 mg/dL (ref 0.2–1.2)
CO2 (Bicarbonate): 30 mmol/L (ref 20–32)
Calcium: 9.5 mg/dL (ref 8.5–10.5)
Chloride: 104 mmol/L (ref 98–110)
Creatinine: 1 mg/dL (ref 0.55–1.30)
Glucose: 324 mg/dL — ABNORMAL HIGH (ref 70–139)
Potassium: 3.9 mmol/L (ref 3.6–5.2)
Protein, total: 7.6 g/dL (ref 6.0–8.4)
Sodium: 138 mmol/L (ref 135–146)
eGFRcr: 95 mL/min/{1.73_m2} (ref >=60)

## 2022-01-22 LAB — TROPONIN I, HIGH SENSITIVITY
Troponin I, High Sensitivity: 41 ng/L
Troponin I, High Sensitivity: 40 ng/L

## 2022-01-22 LAB — HEMOGLOBIN A1C: HEMOGLOBIN A1C % (INT/EXT): 10.6 % — ABNORMAL HIGH (ref <5.6)

## 2022-01-22 LAB — MAGNESIUM: Magnesium: 1.9 mg/dL (ref 1.6–2.6)

## 2022-01-22 MED ORDER — HYDROmorphone (Dilaudid) injection 0.5 mg
0.5 | INTRAMUSCULAR | Status: DC | PRN
Start: 2022-01-22 — End: 2022-01-22

## 2022-01-22 MED ORDER — ondansetron (Zofran) injection 4 mg
4 | Freq: Three times a day (TID) | INTRAMUSCULAR | Status: DC | PRN
Start: 2022-01-22 — End: 2022-01-22

## 2022-01-22 MED ORDER — metoclopramide (Reglan) injection 10 mg
5 | Freq: Four times a day (QID) | INTRAMUSCULAR | Status: DC | PRN
Start: 2022-01-22 — End: 2022-01-22
  Administered 2022-01-22: 05:00:00 10 mg via INTRAVENOUS

## 2022-01-22 MED FILL — MORPHINE 4 MG/ML INTRAVENOUS SOLUTION WRAPPER: 4 4 mg/mL | INTRAVENOUS | Qty: 1

## 2022-01-22 MED FILL — ONDANSETRON HCL (PF) 4 MG/2 ML INJECTION SOLUTION: 4 4 mg/2 mL | INTRAMUSCULAR | Qty: 2

## 2022-01-22 MED FILL — METOCLOPRAMIDE 5 MG/ML INJECTION SOLUTION: 5 5 mg/mL | INTRAMUSCULAR | Qty: 2

## 2022-01-22 MED FILL — HYDROMORPHONE 0.5 MG/0.5 ML INJECTION WRAPPER: 0.5 0.5 mg/0.5 mL | INTRAMUSCULAR | Qty: 0.5

## 2022-01-22 NOTE — H&P (Signed)
INPATIENT H&P  [facility] EMERGENCY DEPARTMENT  [address]  [place] [address]  [phone]    Date of Service: 01/22/2022  Date of Admission: 01/21/2022    [name]  DOB: 1975-10-27  MRN: [id number]  CSN: [id number]    Attending Physician: Will Bonnet, MD  Admitting Provider: Will Bonnet, MD    Chief Complaint  Chief Complaint   Patient presents with   . Chest Pain       History Of Present Illness  [name] is a 46 y.o. adult with history of CAD/cardiac arrest, now post CABG x2 (last of which was in 2012) with most recent NSTEMI in 2020 per report of the patient, hypertension, dyslipidemia, DM comes to the hospital with complaint of chest  pain that started while he was sitting, described the pain 10 out of 10, chest tightness like, radiating to the neck and arm associated with shortness of breath, nausea, diaphoresis, lightheadedness.  Patient called 911, aspirin was given in route.  Patient refers that he is allergic to nitro, morphine did not help him with the pain, received also 1 dose of Dilaudid for the ER.  Chest x-ray with no acute findings.  Troponin x2 were negative EKG new T wave inversion in aVL compare to prior.      Review of Systems:  Please refer to the History of Present Illness above. All other systems are reviewed and are negative.      Past Medical History  [name] has a past medical history of Coronary artery disease, Diabetes mellitus (CMS/HCC), Heart attack (CMS/HCC), and Hypertension.    Surgical History  [name] has a past surgical history that includes Arterial bypass surgry; Cholecystectomy; and Coronary artery bypass graft.     Social History   reports that [employer]. Geiman has been smoking cigarettes. [name] has never used smokeless tobacco. [name] reports that [employer]. Deem does not drink alcohol and does not use drugs.    Family History  family history includes Heart disease in [place] L.  Anes's father.       Advance Care Plan  No emergency contact information on file.  Full Code       MIPS STANDARD STATEMENT- ADVANCE CARE PLAN:  I confirmed that the patient's Advance Care Plan is present, code status is documented, or surrogate decision maker is listed in the patient's medical record.      Allergies  Ibuprofen, Ketorolac, Nitroglycerin, Tramadol, and Codeine     Home Medications  Current Outpatient Medications   Medication Instructions   . aspirin 81 mg, oral, Daily RT   . atorvastatin (LIPITOR) 40 mg, oral, Daily RT   . CLINDAMYCIN HCL ORAL oral   . insulin glargine (LANTUS) 15 Units, subcutaneous, Nightly   . metoprolol succinate XL (TOPROL-XL) 25 mg, oral, 2 times daily   . metoprolol tartrate (LOPRESSOR) 25 mg, oral, 2 times daily   . Ventolin HFA 90 mcg/actuation inhaler 2 puffs, inhalation, Every 6 hours PRN         MIPS STANDARD STATEMENT - Documentation of Current Medications in the MEDICAL RECORD NUMBER I have utilized all available immediate resources to obtain, update, or review the patient's current medications (including all prescriptions, over-the-counter products, herbals, cannabis/cannabidiol products, and vitamin/mineral/dietary (nutritional) supplements).     Objective     Last Recorded Vitals  BP (!) 145/84   Pulse 67   Temp 36.1 C (97 F)   Resp 14  SpO2 96%   Oxygen Therapy  SpO2: 96 %  Patient Activity During SpO2 Measurement: At rest  Oxygen Therapy: None (Room air)   No intake or output data in the 24 hours ending 01/22/22 0112   Physical Exam  Constitutional:       Appearance: Normal appearance.   HENT:      Head: Atraumatic.   Eyes:      Pupils: Pupils are equal, round, and reactive to light.   Cardiovascular:      Rate and Rhythm: Normal rate and regular rhythm.   Pulmonary:      Effort: No respiratory distress.      Breath sounds: Normal breath sounds. No wheezing or rales.   Abdominal:      General: Bowel sounds are normal. There is no distension.      Palpations:  Abdomen is soft.      Tenderness: There is no abdominal tenderness. There is no guarding or rebound.   Musculoskeletal:      Cervical back: Neck supple.      Right lower leg: No edema.      Left lower leg: No edema.   Skin:     General: Skin is warm.   Neurological:      General: No focal deficit present.      Mental Status: [name] is alert and oriented to person, place, and time.   Psychiatric:         Mood and Affect: Mood normal.         Relevant Results  Recent Results (from the past 24 hour(s))   Comprehensive metabolic panel    Collection Time: 01/21/22 10:07 PM   Result Value Ref Range    Sodium 138 135 - 146 mmol/L    Potassium 3.9 3.6 - 5.2 mmol/L    Chloride 104 98 - 110 mmol/L    CO2 (Bicarbonate) 30 20 - 32 mmol/L    Anion Gap 4 3 - 14 mmol/L    BUN 12 6 - 24 mg/dL    Creatinine 1.61 0.96 - 1.30 mg/dL    eGFRcr 95 >=04 VW/UJW/1.19J*4    Glucose 324 (H) 70 - 139 mg/dL    Fasting? No     Calcium 9.5 8.5 - 10.5 mg/dL    AST 54 (H) 6 - 42 U/L    ALT 123 (H) 0 - 55 U/L    Alkaline phosphatase 80 30 - 130 U/L    Protein, total 7.6 6.0 - 8.4 g/dL    Albumin 3.6 3.2 - 5.0 g/dL    Bilirubin, total 0.4 0.2 - 1.2 mg/dL   Troponin I, High Sensitivity    Collection Time: 01/21/22 10:07 PM   Result Value Ref Range    Troponin I, High Sensitivity 41 See Narrative ng/L   Magnesium    Collection Time: 01/21/22 10:07 PM   Result Value Ref Range    Magnesium 1.9 1.6 - 2.6 mg/dL   CBC w/ Differential    Collection Time: 01/21/22 10:07 PM   Result Value Ref Range    WBC 5.6 4.0 - 11.0 K/uL    RBC 4.65 4.20 - 5.90 M/uL    Hemoglobin 14.5 13.0 - 17.5 g/dL    Hematocrit 78.2 95.6 - 53.0 %    MCV 91.6 80.0 - 100.0 fL    MCH 31.2 26.0 - 34.0 pg    MCHC 34.0 31.0 - 37.0 g/dL    RDW-CV 21.3 08.6 - 57.8 %  RDW-SD 41.8 35.0 - 51.0 fL    Platelets 237 150 - 400 K/uL    MPV 11.1 9.1 - 12.4 fL    Neutrophil % 58.8 %    Lymphocyte % 34.1 %    Monocytes % 5.2 %    Eosinophils % 1.1 %    Basophils % 0.4 %    Immature  Granulocytes % 0.4 %    NRBC % 0.0 0.0 - 0.0 %    Neutrophils Absolute 3.32 1.50 - 7.95 K/uL    Lymphocytes Absolute 1.92 0.70 - 4.00 K/uL    Monocytes Absolute 0.29 (L) 0.36 - 0.83 K/uL    Eosinophils Absolute 0.06 0.00 - 0.50 K/uL    Basophils Absolute 0.02 0.00 - 0.22 K/uL    Immature Granulocytes Absolute 0.02 0.00 - 0.10 K/uL    NRBC Absolute 0.00 0.00 - 2.00 K/uL   Troponin I, High Sensitivity    Collection Time: 01/21/22 11:58 PM   Result Value Ref Range    Troponin I, High Sensitivity 40 See Narrative ng/L       XR CHEST 2 VIEWS   Final Result      1. No acute pathology.            Slobodan Miseljic Dr 01/21/2022 10:53 PM            Assessment/Plan   Number and Complexity of problems:   Principal Problem:    Chest pain      46 y.o. adult with history of CAD/cardiac arrest, now post CABG x2 (last of which was in 2012) with most recent NSTEMI in 2020 per report of the patient, hypertension, dyslipidemia, DM comes to the hospital with complaint of chest pain that started while he was sitting, described the pain 10 out of 10, chest tightness like, radiating to the neck and arm associated with shortness of breath, nausea, diaphoresis, lightheadedness.            Principal Problem:   # chest pain   # CAD/cardiac arrest, now post CABG x2   Differential Diagnosis: unstable angina/musculoscheltal/pain seeking    Troponin x2 were negative EKG new T wave inversion in aVL compare to prior.     Plan: telemetry monitor   Cycle troponin   Asa, statin, BB  cardiology consult   Opioids for pain, zofran/regnal for nausea  Utox       Other Conditions and Status:     # mild transaminates   Unchanged  Plan: stable, monitor     # DM   Unchanged  Plan: cont ILSS, lantus at bedtime , check a1c     DVT prophylaxis: Lovenox  Code Status and Discussions: Full Code   Social Determinants of Health that impact treatment or disposition: None  Shared decision making: Patient   Family Update: n/a   Discharge plan/Disposition: telemetry      MEDICAL DECISION MAKING:   MDM Data  Clinical lab tests: reviewed  Tests in the radiology section of CPT: reviewed  Tests in the medicine section of CPT: reviewed  External documents reviewed: ExternalDocuments: ED notes  My EKG interpretation: TWI aVL  My CT interpretation: n/a   My X-ray interpretation: normal   Tests considered but not ordered: n/a   Discussed with: Patient    Will Bonnet, MD

## 2022-01-22 NOTE — ED Notes (Signed)
Patient leaves AMA with "brother"      Leafy RoLettisha Hadleigh Felber, [place]  01/22/22 [phone]

## 2022-01-22 NOTE — Discharge Summary (Signed)
DISCHARGE SUMMARY  [facility] EMERGENCY DEPARTMENT  [address]  [place] [address]  [phone]          [name]  DOB: 01/12/1976  MRN: [id number]  CSN: [id number]    Date of Admission 01/21/2022  Date of Discharge 01/22/2022    Attending Physician Will Bonnet, MD  Discharge Physician Will Bonnet, MD    Chief Complaint/Reason for Admission  Chief Complaint   Patient presents with   . Chest Pain       Discharge Diagnosis  Chest pain    Patient Active Problem List   Diagnosis   . Chest pain   . Hypokalemia   . Chest pain, unspecified type         History of Present Illness  46 y.o. adult with history of CAD/cardiac arrest, now post CABG x2(last of which was in 2012)with most recent NSTEMI in 2020 per report of the patient, hypertension, dyslipidemia, DM comes to the hospital with complaint of chest  pain that started while he was sitting, described the pain 10 out of 10, chest tightness like, radiating to the neck and arm associated with shortness of breath, nausea, diaphoresis, lightheadedness.  Patient called 911, aspirin was given in route.  Patient refers that he is allergic to nitro, morphine did not help him with the pain, received also 1 dose of Dilaudid for the ER.  Chest x-ray with no acute findings.  Troponin x2 were negative EKG new T wave inversion in aVL compare to prior.     Hospital Course  46 y.o. adult with history of CAD/cardiac arrest, now post CABG x2(last of which was in 2012)with most recent NSTEMI in 2020 per report of the patient, hypertension, dyslipidemia, DM comes to the hospital with complaint of chest pain. Troponin x2 were negative EKG new T wave inversion in aVL compare to prior.   Patient asking for pain medications, he wanted his brother to sleep over, was told was not possible he decided to leave AMA.         Pertinent Physical Exam At Time of Discharge  Physical Exam   Cardiovascular:      Rate and Rhythm: Normal rate and regular rhythm.    Pulmonary:      Effort: No respiratory distress.      Breath sounds: Normal breath sounds. No wheezing or rales.   Abdominal:      General: Bowel sounds are normal. There is no distension.      Palpations: Abdomen is soft.      Tenderness: There is no abdominal tenderness. There is no guarding or rebound.   Musculoskeletal:      Cervical back: Neck supple.      Right lower leg: No edema.      Left lower leg: No edema.   Skin:     General: Skin is warm.   Neurological:      General: No focal deficit present.      Mental Status: [name] is alert and oriented to person, place, and time.   Psychiatric:         Mood and Affect: Mood normal.       Test Results Pending At Discharge  Pending Labs     Order Current Status    Hemoglobin A1c In process          Advance Care Plan  No emergency contact information on file.  Full Code     Advanced Directives:  MIPS: Adv Dir  Plan  I confirmed today that the patient's Advance Care Plan is documented in the medical record either by discussing and documenting the patient's Advance Care Plan, confirming that the patient's surrogate decision maker is documented in the medical record, or confirming that the patient's Advance Care Plan is presently documented.     Medications on Discharge:       Your medication list      ASK your doctor about these medications      Instructions Last Dose Given Next Dose Due   aspirin 81 mg capsule           atorvastatin 10 mg tablet  Commonly known as: Lipitor           CLINDAMYCIN HCL ORAL           insulin glargine 100 unit/mL (3 mL) injection  Commonly known as: Lantus           metoprolol succinate XL 50 mg 24 hr tablet  Commonly known as: Toprol-XL           metoprolol tartrate 25 mg tablet  Commonly known as: Lopressor           Ventolin HFA 90 mcg/actuation inhaler  Generic drug: albuterol                  Relevant Lab/Imaging Data:    Recent Results (from the past 24 hour(s))   Comprehensive metabolic panel    Collection Time:  01/21/22 10:07 PM   Result Value Ref Range    Sodium 138 135 - 146 mmol/L    Potassium 3.9 3.6 - 5.2 mmol/L    Chloride 104 98 - 110 mmol/L    CO2 (Bicarbonate) 30 20 - 32 mmol/L    Anion Gap 4 3 - 14 mmol/L    BUN 12 6 - 24 mg/dL    Creatinine 9.621.00 9.520.55 - 1.30 mg/dL    eGFRcr 95 >=84>=60 XL/KGM/0.10U*7mL/min/1.64m*2    Glucose 324 (H) 70 - 139 mg/dL    Fasting? No     Calcium 9.5 8.5 - 10.5 mg/dL    AST 54 (H) 6 - 42 U/L    ALT 123 (H) 0 - 55 U/L    Alkaline phosphatase 80 30 - 130 U/L    Protein, total 7.6 6.0 - 8.4 g/dL    Albumin 3.6 3.2 - 5.0 g/dL    Bilirubin, total 0.4 0.2 - 1.2 mg/dL   Troponin I, High Sensitivity    Collection Time: 01/21/22 10:07 PM   Result Value Ref Range    Troponin I, High Sensitivity 41 See Narrative ng/L   Magnesium    Collection Time: 01/21/22 10:07 PM   Result Value Ref Range    Magnesium 1.9 1.6 - 2.6 mg/dL   CBC w/ Differential    Collection Time: 01/21/22 10:07 PM   Result Value Ref Range    WBC 5.6 4.0 - 11.0 K/uL    RBC 4.65 4.20 - 5.90 M/uL    Hemoglobin 14.5 13.0 - 17.5 g/dL    Hematocrit 25.342.6 66.432.0 - 53.0 %    MCV 91.6 80.0 - 100.0 fL    MCH 31.2 26.0 - 34.0 pg    MCHC 34.0 31.0 - 37.0 g/dL    RDW-CV 40.312.5 47.411.5 - 25.914.5 %    RDW-SD 41.8 35.0 - 51.0 fL    Platelets 237 150 - 400 K/uL    MPV 11.1 9.1 - 12.4 fL    Neutrophil % 58.8 %  Lymphocyte % 34.1 %    Monocytes % 5.2 %    Eosinophils % 1.1 %    Basophils % 0.4 %    Immature Granulocytes % 0.4 %    NRBC % 0.0 0.0 - 0.0 %    Neutrophils Absolute 3.32 1.50 - 7.95 K/uL    Lymphocytes Absolute 1.92 0.70 - 4.00 K/uL    Monocytes Absolute 0.29 (L) 0.36 - 0.83 K/uL    Eosinophils Absolute 0.06 0.00 - 0.50 K/uL    Basophils Absolute 0.02 0.00 - 0.22 K/uL    Immature Granulocytes Absolute 0.02 0.00 - 0.10 K/uL    NRBC Absolute 0.00 0.00 - 2.00 K/uL   Troponin I, High Sensitivity    Collection Time: 01/21/22 11:58 PM   Result Value Ref Range    Troponin I, High Sensitivity 40 See Narrative ng/L       XR CHEST 2 VIEWS   Final Result      1. No acute  pathology.            Slobodan Miseljic Dr 01/21/2022 10:53 PM           Issues Requiring Follow-Up  Left AMA    Time spent on Discharge:  Greater than 35 minutes spent (of which >50% was spent in direct counselling to patient/family) in care for the patient including exam, discussion with patient, family & hospital team members, review of EMR as well as extensive review of  labs and appropriate tests.    Incidental Findings  N/a     Outpatient Follow-Up  No future appointments.      @MIPS  STANDARD STATEMENT - HEART FAILURE: @  N/A; no CHF or EF> 40%

## 2022-01-22 NOTE — ED Notes (Addendum)
Patient states that if his "brother" can't come upstairs and spend the night, then he is not willing to be admitted. Yvonne Kendall, Nursing Supervisor informed.             Leafy Ro, RN  01/22/22 [phone]

## 2022-01-23 LAB — BMP (EXT)
Anion Gap (EXT): 12 mmol/L (ref 3–17)
BUN (EXT): 9 mg/dL (ref 8–25)
CO2 (EXT): 24 mmol/L (ref 23–32)
CalciumCalcium (EXT): 9.2 mg/dL (ref 8.5–10.5)
Chloride (EXT): 102 mmol/L (ref 98–108)
Creatinine (EXT): 0.82 mg/dL (ref 0.60–1.50)
GFR Estimated (Calc) (EXT): 111 mL/min/{1.73_m2} (ref >59)
Glucose (EXT): 338 mg/dL — ABNORMAL HIGH (ref 70–110)
Potassium (EXT): 4.7 mmol/L (ref 3.4–5.0)
Sodium (EXT): 138 mmol/L (ref 135–145)

## 2022-01-27 LAB — BMP (EXT)
Anion Gap (EXT): 11 mmol/L (ref 3–17)
BUN (EXT): 8 mg/dL (ref 6–20)
CO2 (EXT): 26 mmol/L (ref 22–31)
CalciumCalcium (EXT): 9.5 mg/dL (ref 8.4–10.2)
Chloride (EXT): 100 mmol/L — ABNORMAL LOW (ref 101–113)
Creatinine (EXT): 0.76 mg/dL (ref 0.70–1.20)
GFR Estimated (Calc) (EXT): 114 mL/min/{1.73_m2} (ref >59)
Glucose (EXT): 202 mg/dL — ABNORMAL HIGH (ref 70–105)
Potassium (EXT): 3.8 mmol/L (ref 3.4–5.0)
Sodium (EXT): 137 mmol/L (ref 133–145)

## 2022-03-26 LAB — PROTHROMBIN TIME CARE EVERYWHERE: INR CARE EVERYWHERE: 1.1 (ref <1.3)

## 2022-03-28 LAB — PROTHROMBIN TIME CARE EVERYWHERE
INR CARE EVERYWHERE: 1.04 (ref 0.83–1.20)
PROTHROMBIN TIME CARE EVERYWHERE: 12.1 s (ref 9.2–13.5)

## 2022-03-28 LAB — APTT CARE EVERYWHERE: APTT CARE EVERYWHERE: 29 s (ref 27–37)

## 2022-03-29 LAB — LIPID PROFILE (EXT)
Cholesterol (EXT): 205 mg/dL — ABNORMAL HIGH (ref <200)
HDL Cholesterol (EXT): 29 mg/dL — ABNORMAL LOW (ref >34)
LDL Cholesterol, CALC (EXT): 135 mg/dL — ABNORMAL HIGH (ref <130)
Triglycerides (EXT): 205 mg/dL — ABNORMAL HIGH (ref 40–200)

## 2022-03-29 LAB — HEMOGLOBIN A1C: HEMOGLOBIN A1C % (INT/EXT): 13.6 % — ABNORMAL HIGH (ref 4.0–6.0)

## 2022-03-29 LAB — HEMOGLOBIN A1C CARE EVERYWHERE: HEMOGLOBIN A1C CARE EVERYWHERE: 13.6 % — ABNORMAL HIGH (ref 4.0–6.0)

## 2022-04-10 LAB — HEMOGLOBIN A1C
Estimated Average Glucose mg/dL (INT/EXT): 338 mg/dL — ABNORMAL HIGH (ref 82–117)
HEMOGLOBIN A1C % (INT/EXT): 13.4 % — ABNORMAL HIGH (ref 4.5–5.6)

## 2022-04-10 LAB — HEMOGLOBIN A1C CARE EVERYWHERE
HEMOGLOBIN A1C CARE EVERYWHERE: 13.4 % — ABNORMAL HIGH (ref 4.5–5.6)
MEAN PLASMA GLUCOSE CARE EVERYWHERE: 338 mg/dL — ABNORMAL HIGH (ref 82–117)

## 2022-05-07 LAB — PROTHROMBIN TIME CARE EVERYWHERE
INR CARE EVERYWHERE: 1.1 (ref 0.83–1.20)
PROTHROMBIN TIME CARE EVERYWHERE: 12.8 s (ref 9.2–13.5)

## 2022-05-07 LAB — TYPE AND SCREEN CARE EVERYWHERE
ABO/RH CARE EVERYWHERE: O POS
ANTIBODY SCREEN CARE EVERYWHERE: NEGATIVE

## 2022-05-07 LAB — APTT CARE EVERYWHERE: APTT CARE EVERYWHERE: 31 s (ref 27–37)

## 2022-06-13 LAB — BMP (EXT)
Anion Gap (EXT): 7 (ref 5–15)
BUN (EXT): 11 mg/dL (ref 7–23)
CO2 (EXT): 29 mmol/L (ref 24–32)
CalciumCalcium (EXT): 10 mg/dL (ref 8.7–10.7)
Chloride (EXT): 102 mmol/L (ref 97–110)
Creatinine (EXT): 0.71 mg/dL (ref 0.60–1.30)
Glucose (EXT): 172 mg/dL — ABNORMAL HIGH (ref 70–99)
Potassium (EXT): 4.1 mmol/L (ref 3.5–5.3)
Sodium (EXT): 138 mmol/L (ref 135–145)
eGFR - Creat CKD-EPI (EXT): >90 mL/min/{1.73_m2} (ref >=60)

## 2022-06-16 LAB — BMP (EXT)
Anion Gap (EXT): 12 mmol/L (ref 3–17)
BUN (EXT): 8 mg/dL (ref 8–25)
CO2 (EXT): 23 mmol/L (ref 23–32)
CalciumCalcium (EXT): 9.4 mg/dL (ref 8.5–10.5)
Chloride (EXT): 101 mmol/L (ref 98–108)
Creatinine (EXT): 0.8 mg/dL (ref 0.60–1.50)
GFR Estimated (Calc) (EXT): 112 mL/min/{1.73_m2} (ref >59)
Glucose (EXT): 315 mg/dL — ABNORMAL HIGH (ref 70–110)
Potassium (EXT): 3.9 mmol/L (ref 3.4–5.0)
Sodium (EXT): 136 mmol/L (ref 135–145)

## 2022-06-27 LAB — BMP (EXT)
Anion Gap (EXT): 11 mmol/L (ref 3–17)
BUN (EXT): 12 mg/dL (ref 6–20)
CO2 (EXT): 26 mmol/L (ref 22–32)
CalciumCalcium (EXT): 9.7 mg/dL (ref 8.9–10.3)
Chloride (EXT): 101 mmol/L (ref 98–107)
Creatinine (EXT): 0.7 mg/dL (ref 0.6–1.3)
GFR Estimated (Calc) (EXT): 117 mL/min/{1.73_m2} (ref 60–128)
Glucose (EXT): 232 mg/dL — ABNORMAL HIGH (ref 65–99)
Potassium (EXT): 3.9 mmol/L (ref 3.6–5.1)
Sodium (EXT): 138 mmol/L (ref 136–145)

## 2022-07-03 LAB — ESTIMATED GLOMERULAR FILTRATION RATE CARE EVERYWHERE: ESTIMATED GFR CARE EVERYWHERE: >60 mL/min/{1.73_m2} (ref >=60)

## 2022-07-04 LAB — BMP (EXT)
Anion Gap (EXT): 10 (ref 5–10)
BUN (EXT): 11 mg/dL (ref 6–20)
CO2 (EXT): 24 mmol/L (ref 21–31)
CalciumCalcium (EXT): 9.8 mg/dL (ref 8.6–10.3)
Chloride (EXT): 104 mmol/L (ref 98–107)
Creatinine (EXT): 0.87 mg/dL (ref 0.80–1.30)
Glucose (EXT): 222 mg/dL — ABNORMAL HIGH (ref 70–99)
Potassium (EXT): 3.9 mmol/L (ref 3.5–5.1)
Sodium (EXT): 138 mmol/L (ref 136–145)

## 2022-07-04 LAB — HEMOGLOBIN A1C: HEMOGLOBIN A1C % (INT/EXT): 12.3 % — ABNORMAL HIGH (ref 4.2–5.8)

## 2022-07-04 LAB — LIPID PROFILE (EXT)
Cholesterol (EXT): 211 mg/dL — ABNORMAL HIGH (ref <=200)
HDL Cholesterol (EXT): 31 mg/dL
LDL Cholesterol, CALC (EXT): 141.4 mg/dL
Triglycerides (EXT): 193 mg/dL — ABNORMAL HIGH (ref 32–149)

## 2022-07-04 LAB — HEMOGLOBIN A1C CARE EVERYWHERE: HEMOGLOBIN A1C CARE EVERYWHERE: 12.3 % — ABNORMAL HIGH (ref 4.2–5.8)

## 2022-07-09 LAB — BMP (EXT)
Anion Gap (EXT): 13 mmol/L (ref 7–17)
BUN (EXT): 15 mg/dL (ref 6–23)
CO2 (EXT): 24 mmol/L (ref 22–31)
CalciumCalcium (EXT): 9.4 mg/dL (ref 8.8–10.7)
Chloride (EXT): 102 mmol/L (ref 98–107)
Creatinine (EXT): 0.87 mg/dL (ref 0.50–1.20)
GFR Estimated (Calc) (EXT): 109 mL/min/{1.73_m2} (ref >59)
Glucose (EXT): 243 mg/dL — ABNORMAL HIGH (ref 70–100)
Potassium (EXT): 4 mmol/L (ref 3.4–5.1)
Sodium (EXT): 139 mmol/L (ref 136–145)

## 2022-08-26 LAB — APTT CARE EVERYWHERE: APTT CARE EVERYWHERE: 26.3 s — ABNORMAL LOW (ref 27.2–37.2)

## 2022-08-26 LAB — PROTHROMBIN TIME CARE EVERYWHERE
INR CARE EVERYWHERE: 1.01
PROTHROMBIN TIME CARE EVERYWHERE: 11.1 s (ref 9.3–12.9)

## 2022-08-26 NOTE — Nursing Note (Signed)
Formatting of this note might be different from the original.  Report received from ED. Pt admitted into room 180-2 and has been oriented to room and unit. Pt has refused meds and fluids, see MAR. MD Reznikoff notified about non compliance. No signs of distress noted.     2310 pt transported by this nurse via wheelchair to CT scan.     2323 Pt brought back to room via wheelchair from CT scan    2325 Pt resting in bed, breathing is even and unlabored, no signs of distress noted, bed in lowest position, call light within reach, plan of care ongoing.   Electronically signed by Delena Serve, RN at 08/26/2022  9:57 PM PDT

## 2022-08-26 NOTE — Unmapped (Signed)
Formatting of this note might be different from the original.  Pt c/o a stabbing/ squeezing pain in his chest that started about 40 minutes ago. Pt states it is radiating down his left arm, jaw, and back. Per pt he has a cardiac hx.   Electronically signed by Elvera Bicker, RN at 08/26/2022 10:29 AM PDT

## 2022-08-26 NOTE — Progress Notes (Signed)
Formatting of this note might be different from the original.  The patient asked to speak with me in person and told me that he was in severe chest pain and insisted on getting morphine.    Plan:   morphine 3 mg IV once  Start gabapentin  CT scan of the chest with contrast stat  Electronically signed by Carren Rang, DO at 08/26/2022  7:34 PM PDT

## 2022-08-26 NOTE — ED Provider Notes (Signed)
Formatting of this note is different from the original.  [facility] EMERGENCY  [address]  [place] [address]  [phone]    History  No chief complaint on file.    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  Denies any vomiting.    Past Medical History:   Diagnosis Date    Coronary artery disease     Hypercholesteremia     MI (myocardial infarction) (CMS/HCC)     x3     HISTORY PROVIDED BY: Patient    Past Surgical History:   Procedure Laterality Date    BYPASS GRAFT      x 2    CHOLECYSTECTOMY       Social History     Tobacco Use    Smoking status: Every Day     Current packs/day: 0.50     Types: Cigarettes    Smokeless tobacco: Never   Substance Use Topics    Drug use: No     Review of Systems   All other systems reviewed and are negative.    Physical Exam  BP (!) 162/83 (BP Location: Left arm, Patient Position: Sitting)   Pulse 76   Temp 97.7 F (36.5 C) (Temporal)   Resp 18   SpO2 99%     Physical Exam  Vitals and nursing note reviewed.   Constitutional:       General: He is not in acute distress.  HENT:      Head: Normocephalic and atraumatic.      Right Ear: External ear normal.      Left Ear: External ear normal.      Mouth/Throat:      Mouth: Mucous membranes are moist.      Pharynx: No oropharyngeal exudate.   Eyes:      Conjunctiva/sclera: Conjunctivae normal.      Pupils: Pupils are equal, round, and reactive to light.   Cardiovascular:      Rate and Rhythm: Normal rate and regular rhythm.   Pulmonary:      Effort: Pulmonary effort is normal. No respiratory distress.      Breath sounds: Normal breath sounds.   Abdominal:      General: Bowel sounds are normal. There is no distension.      Palpations: Abdomen is soft.       Tenderness: There is no abdominal tenderness.   Musculoskeletal:         General: Normal range of motion.      Cervical back: Normal range of motion and neck supple.   Skin:     General: Skin is warm and dry.   Neurological:      Mental Status: He is alert.      Cranial Nerves: Cranial nerves 2-12 are intact.      Sensory: Sensation is intact.      Motor: Motor function is intact.      Coordination: Finger-Nose-Finger Test normal.      Comments: 5/5 strength to B/L U/L extremities; no diminished sensation to light touch     Results  Labs Reviewed   CBC W/ AUTO DIFF - Abnormal       Result Value    WBC 4.88  RBC 4.40      Hemoglobin 13.4 (*)     Hematocrit 40.5      MCV 92.0      MCH 30.5      MCHC 33.1 (*)     RDW 12.7      Platelets 216      MPV 10.4      NRBC 0.0      Neutrophils (Relative) 56.4      Lymphocytes (Relative) 34.0      Monocytes (Relative) 6.8      Eosinophils (Relative) 1.8      Basophils (Relative) 0.6      Immature Granulocytes (Relative) 0.4      Neutrophils (Absolute) 2.8      Lymphocytes (Absolute) 1.7      Monocytes (Absolute) 0.3      Eosinophils (Absolute) 0.1      Basophils (Absolute) 0.0      Immature Granulocytes (Absolute) 0.0     COMPREHENSIVE METABOLIC PANEL - Abnormal    Sodium 138      Potassium 4.0      Chloride 102      CO2 28.7      BUN 11.0      Creatinine 0.95      Glucose 301 (*)     Calcium 8.9      AST 74 (*)     Alkaline Phosphatase 69      Total Protein 7.2      Albumin 3.2 (*)     Total Bilirubin 0.3      Glomerular Filtration Rate >90.0      Anion Gap 7.3      ALT 116 (*)     Narrative:     STAGES OF CHRONIC KIDNEY DISEASE    STAGE      GFR           DESCRIPTION  1           90.0+         Normal Kidney function but urine findings or structural abnormalities or genetic trait point to kidney disease.    2           60.0-89.0     Mildly reduced kidney function, and other findings (as in stage 1) point to kidney disease.    3           30.0-59.0     Moderately reduced  kidney function.    4           15.0-29.0     Severely reduced kidney function.    5           <15.0         Very severe, or endstage kidney failure.   APTT - Abnormal    PTT 26.3 (*)     Narrative:     Population Mean 28 sec.   LIPASE, SERUM - Normal    Lipase 31     PROTIME-INR - Normal    Protime 11.1      INR 1.01      Narrative:     Clinical Situation                       INR Target      Mechanical prosthetic heart valves       2.5-3.5  Prevention of systemic embolism from     2.0-3.0       Acute myocardila infarction  Valvular heart disease       Atrial fibrilation  Pulmonary embolism treatment             2.0-3.0  Venous thrombosis treatment              2.0-3.0   TROPONIN I - Normal    HS Troponin I 67      Narrative:     High-sensitivity Troponin I (hsTnI) assay can reliably detect low troponin concentrations relative to conventional troponin assays. It is the preferred marker of myocardial necrosis as recommended by the Fourth Universal Definition of Myocardial Infarction Guidelines.    The diagnosis of acute myocardial infarction (AMI) is made based on a rise or fall of troponin with at least one measurement exceeding the laboratory's upper limit of normal(indicating myocardial injury), in the context of reasonable suspicion for coronary ischemia (e.g. typical symptoms, changes on ECG, evidence for loss of myocardial function or demonstration of obstructive coronary artery disease).    NOTE: Although abnormal hsTnI values reflect INJURY to myocardial cells, an elevated hsTnI does not indicate the CAUSE of injury (i.e. ischemia versus non-ischemic disease).    In some cases, myocardial injury is chronic and relatively stable such that hsTnI values remain elevated but do not change substantially over hours to days (examples include chronic kidney disease, heart failure or advanced patient age).    When determining whether there has been a significant rise or fall of troponin on serial sampling,  absolute change in troponin concentration has greater diagnostic accuracy for AMI than relative change criteria. A change of >7 ng/L over a 2-hour interval or a change of >10 ng/L over a 3-hour interval is suggested as a significant change.  If the initial hsTnI is below or slightly above the 99th percentile upper reference limit, a 50% change from the baseline is considered significant.  If the initial hsTnI is above the 99th percentile upper reference limit, a 20% change from the baseline value is considered significant.   NT-PROBRAIN NATRIURETIC PEPTIDE - Normal    NT-proBNP 67      Narrative:     BNP Interpretation:    Among patients with dyspnea, BNP is highly sensitive for the  detection of acute congestive heart failure. In addition, a BNP  <300 pg/ml effectively rules out acute congestive heart failure, with  99% negative predictive value.    Knowledge of each individual patient's BNP range may be more  useful than using similar cut-points for every patient.  Marked elevations in BNP levels may be observed in states other  than left ventricular congestive failure, including: acute coronary  syndromes, right heart strain/failure (including pulmonary embolism and  cor pulmonale), critical illness, renal failure, as well as advanced  age.    Falsely low BNP in congestive heart failure patients may be  observed with increasing body--mass index.   URINALYSIS, W/ REFLEX TO MICRO AND CULT, IF INDICATED   TROPONIN I     Imaging  Imaging Results           X-ray Chest 1 View (Final result)  Result time 08/26/22 14:50:40      Final result by Pat Patrick, MD (08/26/22 14:50:40)              Impression:    No acute pulmonary process.             Narrative:    PROCEDURE: XR CHEST 1 VIEW    INDICATION: Chest Pain,    COMPARISON: None.  FINDINGS:   Lines and Tubes: None.    Heart and Mediastinum: The heart and mediastinal contours appear unremarkable.    Lungs and Pleura: The lungs are clear.  There are no pleural  effusions.    Skeletal Structures: Stable postsurgical changes in the sternum including median sternotomy wires and a fixation hardware. No acute osseous findings.                          ED Course      Procedures    MDM  Number of Diagnoses or Management Options  Chest pain  Diagnosis management comments: Differential diagnosis includes: ACS, viral URI, bacterial pneumonia, pneumothorax.    Labs ordered along with EKG, x-ray and respiratory panel.  Initial EKG demonstrates sinus rhythm rate of 73 bpm, otherwise normal axis and intervals, nonspecific ST changes with no old EKG available for prior, repeat EKG demonstrated sinus rhythm at a rate of 75 bpm, otherwise normal axis and intervals, redemonstration of nonspecific ST changes.  Initial troponin is negative, patient multiple allergies to morphine does improve his pain temporarily for roughly 2 hours, labs otherwise unremarkable, given patient's extensive risk factors include previous cardiac arrest, multiple myocardial infarctions, and story concerning for ACS, patient will be admitted to the hospital service.    ED Disposition  Admit    Final diagnoses:   Chest pain     No follow-up provider specified.     Delila Spence, MD  08/26/22 1918    Electronically signed by Delila Spence, MD at 08/26/2022  4:18 PM PDT

## 2022-08-26 NOTE — H&P (Signed)
Formatting of this note is different from the original.  History and Physical-[name], DO  ATT: Delila Spence, MD;* PCP: No PCP, MD  Dustin Carney 46 y.o.(Sep 05, 1975) [facility] EMERGENCY  LOS: 0 days     MRN: [id number] Acct# [id number]    History of Present Illness     CC: No chief complaint on file.    Dustin Carney is a 47 y.o. male from home who reports constant for the past several hours very intense left-sided chest pain extending to the jaw, to the thoracic area of the back and to the arm on the left side, which symptom began while the patient was slowly walking.    No other associated symptoms or recent illness. No change of chest pain with rest or change of body position. (Nitroglycerin could not be given in view of reported allergy of the patient to such medication.)    Report of recurrent chest pain for at least many months or years.    No recent medical follow-up, including no Cardiology follow-up for years.    Medical records show several hospital visits to this institution by the patient in the past few years with the same complaint. The patient had left medical care against medical advice in most or all of those cases. No definite specific unstable heart disease had been found during previous hospital visits.    Medical records include mention of evidence of patient's visit of hospital ER in [place], where he had lived, on average twice a week for at least a year, and include mention of two dozen prescriptions of opiate medication from many physicians in the same period of time. Today, the patient was treated with morphine in ER and is now asking for more morphine for "severe chest pain."    Today, there is so far no evidence of acute or unstable heart disease, including no sign of acute coronary syndrome, of cardiac arrhythmia or of heart failure.    Very high blood glucose level, indicating uncontrolled diabetes mellitus, is again seen.  The patient has not been treated for diabetes  mellitus.    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 (Details are noted in medical records, for example, in previous Cardiology note from Dr Margarette Asal.)    ROS: recurrent "syncope" of unspecified cause.    CASE DISCUSSION:  Chest pain is atypical of angina and, likely, altogether non-anginal and non-cardiac, of uncertain cause, and seems to be chronic and near constant, and I suggest outpatient Pain Management consultation and follow-up for the same complaint.  The patient seems to have been poorly compliant with medical follow-up and, moreover, has recently moved to this area and has not established local medical care, and I suggest that upon discharge from hospital he be referred to specific local physicians for medical follow-up.  Diabetes mellitus should be fully evaluated and treated, and diabetic education should be provided to the patient.     Review of Systems (Complete=10 or more; Extended=2-9; Problem pertinent=1)     All systems reviewed and are negative except as per history of present illness.     PMH     Past Medical History:   Diagnosis Date    Coronary artery disease     Hypercholesteremia     MI (myocardial infarction) (CMS/HCC)     x3     Past Surgical History:   Procedure Laterality Date  BYPASS GRAFT      x 2    CHOLECYSTECTOMY       No family history on file.   No family status information on file.     Social History     Socioeconomic History    Marital status: Single     Spouse name: Not on file    Number of children: Not on file    Years of education: Not on file    Highest education level: Not on file   Tobacco Use    Smoking status: Every Day     Current packs/day: 0.50     Types: Cigarettes    Smokeless tobacco: Never   Substance and Sexual Activity    Alcohol use: Not on file     Comment: social    Drug use: No    Sexual activity: Yes     Partners:  Female     (Not in a hospital admission)    Allergies   Allergen Reactions    Nitroglycerin Hives, Other (See Comments) and Rash     Patient states "passess out"  States drops his BP "too much"  "syncope and hives"  Patient states "passess out"  States drops his BP "too much"  "syncope and hives"  Patient states "passess out"  States drops his BP "too much"     Nsaids     Toradol [Ketorolac Tromethamine]     Tramadol      Physical Exam (Comprehensive 8; Detailed 5-7; Expanded Problem Focused 2-4; Focused 1)     24 Hour Min / Max Current   Temp  Min: 97.7 F (36.5 C)  Max: 97.7 F (36.5 C) 97.7 F (36.5 C)   BP  Min: 133/70  Max: 162/83 133/70    Pulse  Min: 62  Max: 76 62   Resp  Min: 18  Max: 19 19   SpO2  Min: 99 %  Max: 99 % 99 %   Admit: (!) 118 kg (260 lb) (!) 118 kg (260 lb)  Body mass index is 31.65 kg/m.     I/O         04/06 0701  04/07 0700 04/07 0701  04/08 0700    IV Piggyback  500    Total Intake(mL/kg)  500 (4.2)    Net  +500            Const: Obese. Comfortable, lying in bed.   Head:  Atraumatic   Eyes:  Normal Conjunctiva   ENT:  Normal External Ears, Nose and Mouth   Neck:  Full range of motion.  No meningismus.   Resp:  Clear to auscultation bilaterally   Cards: Regular rhythm, no murmurs. No chest trauma, deformity or tenderness.   Abd:  Soft, non tender, non distended. Normal bowel sounds.   Skin:  No petechiae or rashes. Many tattoos.   Back:  No midline or flank tenderness   Ext:  No cyanosis or edema   Neur:  Awake and alert   Psych: Normal Mood and Affect    Data Review:     Results from last 7 days   Lab Units 08/26/22  1421 08/26/22  1917   WBC AUTO 10*3/uL 4.88  --    HEMOGLOBIN g/dL 16.1*  --    HEMATOCRIT % 40.5  --    PLATELETS AUTO 10*3/uL 216  --    SODIUM mmol/L 138  --    POTASSIUM mmol/L 4.0  --    CHLORIDE  mmol/L 102  --    CO2 mmol/L 28.7  --    BUN mg/dL 52.8  --    CREATININE mg/dL 4.13  --    GLUCOSE mg/dL 244*  --    CALCIUM UA mg/dL 8.9  --    BILIRUBIN TOTAL mg/dL 0.3   --    AST U/L 74*  --    ALT U/L 116*  --    ALK PHOS U/L 69  --    LIPASE U/L 31  --    ALBUMIN g/dL 3.2*  --    INR  0.10  --    HS TROPONIN I ng/L 67 66         Invalid input(s): "PCO2", "PO2", "HCO3", "O2SAT"      X-ray Chest 1 View    Result Date: 08/26/2022  No acute pulmonary process.          Meds   Scheduled Meds:  Continuous Infusions:  PRN Meds:.  sodium chloride  D/C Medications (36h ago, onward)      None         Consult Orders   None   A/P     Assessment:     Chest pain, likely non-anginal unspecified, chronic    Coronary artery disease    Previous unspecified heart attacks    Previous coronary artery bypass graft surgery    Hyperglycemia in diabetes mellitus type II (untreated)    Dyslipidemia    Obesity    Abnormal (high) serum levels of liver-associated enzymes, likely effect of statin medication and of fatty liver    History of recurrent unspecified syncope    Previous gastric bypass surgery    Habitual tobacco smoking    Plan:  The patient was admitted to 'Inpatient status' with anticipation of 2 or more midnight stay.    Monitor over night on telemetry  Cardiology follow-up  Echocardiography  May consider cardiac stress test, inpatient or outpatient  Add metformin  Add Jardiance  Add losartan  Avoid opiate medication  Blood testing in the morning tomorrow  Monitor and optimize control of diabetes mellitus  Optimize long-term medical follow-up, compliance with medical care, medication, cardiovascular risk factor control    Level of Care - Telemetry    Reason for inpatient stay - chest pain, CAD, hyperglycemia    Carren Rang, DO  Electronic Signature  08/26/2022 9:06 PM      Electronically signed by Carren Rang, DO at 08/26/2022  6:42 PM PDT

## 2022-08-27 LAB — HEMOGLOBIN A1C CARE EVERYWHERE
ESTIMATED AVERAGE GLUCOSE CARE EVERYWHERE: 274.74 mg/dL
HEMOGLOBIN A1C CARE EVERYWHERE: 11.2 % — ABNORMAL HIGH (ref 4.0–5.6)

## 2022-08-27 NOTE — Unmapped (Signed)
Formatting of this note might be different from the original.    Problem: Pain  Goal: Patient's pain/discomfort is manageable  Description: Assess and monitor patient's pain using appropriate pain scale. Collaborate with interdisciplinary team and initiate plan and interventions as ordered. Re-assess patient's pain level 30 - 60 minutes after pain management intervention.   Outcome: Progressing    Problem: Safety  Goal: Patient will be injury free during hospitalization  Description: Assess and monitor vitals signs, neurological status including level of consciousness and orientation. Assess patient's risk for falls and implement fall prevention plan of care and interventions per hospital policy.     Ensure arm band on, uncluttered walking paths in room, adequate room lighting, call light and overbed table within reach, bed in low position, wheels locked, side rails up per policy, and non-skid footwear provided.   Outcome: Progressing    Problem: Acute Pain:  Goal: Patient reported angina episodes are decreased in frequency, duration and severity  Outcome: Progressing    Problem: Anxiety:  Goal: Achieve calm, comfortable environment  Outcome: Progressing    Electronically signed by Delena Serve, RN at 08/26/2022  9:30 PM PDT

## 2022-08-27 NOTE — Nursing Note (Signed)
Formatting of this note might be different from the original.  In Pt's chart to help Primary nurse Theron Arista  Electronically signed by Humphrey Rolls, RN at 08/27/2022  6:12 AM PDT

## 2022-08-27 NOTE — Unmapped (Signed)
Formatting of this note might be different from the original.    Problem: Pain  Goal: Patient's pain/discomfort is manageable  Description: Assess and monitor patient's pain using appropriate pain scale. Collaborate with interdisciplinary team and initiate plan and interventions as ordered. Re-assess patient's pain level 30 - 60 minutes after pain management intervention.   Outcome: Progressing    Problem: Safety  Goal: Patient will be injury free during hospitalization  Description: Assess and monitor vitals signs, neurological status including level of consciousness and orientation. Assess patient's risk for falls and implement fall prevention plan of care and interventions per hospital policy.     Ensure arm band on, uncluttered walking paths in room, adequate room lighting, call light and overbed table within reach, bed in low position, wheels locked, side rails up per policy, and non-skid footwear provided.   Outcome: Progressing    Electronically signed by Lewis Shock, LVN at 08/27/2022 10:08 AM PDT

## 2022-08-27 NOTE — Unmapped (Signed)
Formatting of this note is different from the original.     08/27/22 1200   Ongoing Discharge Planning   Living Arrangements Family members   Patient Arrived From? Other (Comment)  (Patient stated he has just moved here and is staying in a hotel for now.)   Usual Residence Private residence   Support Systems Family members   Patient Designated Caregiver Self   Consent Signed to Release Medical Information to Caregiver No   Patient Unable to Designate a Caregiver, Follow up Needed No   Patient Declined to Designate a Caregiver No   Assistance Needed Denies   Current Residence Private residence   Home Care Services No   Patient expects to be discharged to: Private residence   Patient Declined Referral to Discharge Planning/Case Management No   Additional Comments I met with the patient at the bedside who stated he has just moved here and is currently staying in a hotel . Stated independent with ADL's at baseline. Denies use of devices . Stated when medically stable for discharge plan is home no services . Will continue to assess and document as more information becomes available .       Electronically signed by Retta Mac Philbin, CM at 08/27/2022  9:06 AM PDT

## 2022-08-27 NOTE — Nursing Note (Signed)
Formatting of this note might be different from the original.  Patient left the hospital against medical advice, he also refused to sign the AMA form or wait to speak to the doctor. This Clinical research associate explained the risks of leaving AMA.  This Clinical research associate paged Dr. Garner Gavel to inform. IV in right forearm  removed by the this Clinical research associate before departure. Patient took all of his belongings (Cell phone).  Electronically signed by Lewis Shock, LVN at 08/27/2022  9:57 AM PDT

## 2022-08-27 NOTE — Unmapped (Signed)
Formatting of this note is different from the original.                                                             PHYSICIAN CLARIFICATION            Dear Dr: Carren Rang, DO  Date: 09/04/2022  Coder/CDS: Delford Field  Phone #:     QUERY    Exercise your independent professional judgment when responding to this query.   Questions asked do not imply that a particular answer is desired or expected. We greatly appreciate your clarification on this issue.    Clinical documentation states:  (H&P by [facility], DO, 08/26/2022):  [name] is a 47 y.o. male from home who reports constant for the past several hours very intense left-sided chest pain extending to the jaw, to the thoracic area of the back and to the arm on the left side, which symptom began while the patient was slowly walking.    No other associated symptoms or recent illness. No change of chest pain with rest or change of body position. (Nitroglycerin could not be given in view of reported allergy of the patient to such medication.)  Chest pain, likely non-anginal unspecified, chronic    Coronary artery disease    Previous unspecified heart attacks    Previous coronary artery bypass graft surgery    Dyslipidemia    Obesity    Abnormal (high) serum levels of liver-associated enzymes, likely effect of statin medication and of fatty liver    History of recurrent unspecified syncope    Previous gastric bypass surgery    Habitual tobacco smoking    (Consults by Koleen Nimrod, MD, 08/27/2022):  Chest pain    #1 atypical/noncardiac chest pain.  Patient need to have pain management by pain specialist.  Doubt cardiac origin for that current chest pain.  2.  Hypertension continue scar medication.  3.  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  Patient probably will need nuclear stress test as an outpatient.    Clinical findings show:         08/26/22  08/26/22  08/27/22    HS Troponin I 67 66 69         Based on the clinical  information provided, please specify/select the most appropriate etiology of chest pain:    []  Acute coronary syndrome    []  Coronary artery disease    []  Costochondritis      []  GERD      []  Persistent atrial fibrillation     []  Pleurisy        [x]  Other explanation of clinical findings. Please specify: other chest pain    Present on Admission:  Yes(Y)    Please also document response in your Progress Notes and/or Discharge Summary and indicate if the condition was present on admission.    PATIENT NAME:Dustin Carney   VW#:[id number]   DOB:March 01, 1976    WGN#:[id number]   AGE/SEX:46 y.o., male   ADMIT DATE:08/26/2022  1:41 PM   DISCHARGE DATE:08/27/2022    Electronically signed by Carren Rang, DO at 09/04/2022  9:59 PM PDT

## 2022-08-27 NOTE — Consults (Signed)
Formatting of this note is different from the original.  CARDIOLOGY CONSULTATION    Name:   Dustin Carney MRN:   [id number]   DOB:   Jun 18, 1975 Acct:   [id number]   Age:   47 y.o. Admit Date:   08/26/2022  1:41 PM   Sex:   male Attending:   Melton Krebs, MD   PCP:  No PCP, MD Location:   [facility] 1E MED SURG     REASON FOR THE CONSULT : Chest pain    History of Present Illness     47 y.o. male significant past medical history of anomalous RCA status post CABG 2007.  Followed by failed graft.  Status post another open heart surgery with LIMA to the RCA.  Patient has not been followed with cardiologist.  Patient has been going to multiple hospitals multiple evaluations for chest pain.  Which is relieved only with opiates.  Patient claimed that he has allergy to nitroglycerin.  And only if his pain is relieved by opiates.  2022 I saw the patient.  At that time patient had more than 80 ER visits.  And had a team provider prescribed more than 26 prescription for opiates.  Patient failed to follow-up.  Patient currently is complaining of retrosternal pressure-like chest pain.  Nonradiating.  Last last for multiple hours.  Nonexertional.    Review of Systems   Review of Systems   Constitutional: Negative.    HENT: Negative.     Eyes: Negative.    Respiratory: Negative.  Negative for cough, chest tightness, shortness of breath and wheezing.    Cardiovascular:  Positive for chest pain. Negative for palpitations and leg swelling.   Endocrine: Negative.    Genitourinary: Negative.    Allergic/Immunologic: Negative.    Neurological: Negative.    Hematological: Negative.    All other systems reviewed and are negative.      Patient History   Medical:     Past Medical History:   Diagnosis Date    Coronary artery disease     Hypercholesteremia     MI (myocardial infarction) (CMS/HCC)     x3     Surgical:     Past Surgical History:   Procedure Laterality Date    BYPASS GRAFT      x 2    CHOLECYSTECTOMY       Family:   History  reviewed. No pertinent family history.     No family status information on file.     Social:    reports that he has been smoking cigarettes. He does not have any smokeless tobacco history on file. He reports that he does not drink alcohol and does not use drugs.       Allergies   Allergies   Allergen Reactions    Nitroglycerin Hives, Other (See Comments) and Rash     Patient states "passess out"  States drops his BP "too much"  "syncope and hives"  Patient states "passess out"  States drops his BP "too much"  "syncope and hives"  Patient states "passess out"  States drops his BP "too much"     Nsaids     Toradol [Ketorolac Tromethamine]     Tramadol        Prescriptions Prior to Arrival   Medications Prior to Admission   Medication Sig Dispense Refill Last Dose    aspirin 325 MG tablet Take 1 tablet (325 mg total) by mouth daily  atorvastatin (LIPITOR) 40 MG tablet Take 40 mg by mouth daily.       metoprolol tartrate (LOPRESSOR) 25 MG tablet Take 25 mg by mouth 2 (two) times a day.          Physical Examination     Vitals       Current   24 Hour Min / Max   Temp (!) 97.6 F (36.4 C) Temp  Min: 97.6 F (36.4 C)  Max: 98.1 F (36.7 C)   BP 106/76  BP  Min: 106/76  Max: 162/83   Pulse 80 Pulse  Min: 55  Max: 80   Resp 16 Resp  Min: 16  Max: 19   SpO2 97 % SpO2  Min: 96 %  Max: 99 %   Weight (!) 119 kg (262 lb 2 oz) Initial Weight (!) 118 kg (260 lb)     Wt Readings from Last 5 Encounters:   08/26/22 (!) 119 kg (262 lb 2 oz)   07/03/21 (!) 118 kg (260 lb)   11/16/20 (!) 118 kg (260 lb)   06/04/18 (!) 122 kg (270 lb)   05/14/17 (!) 118 kg (260 lb)       I/O         04/06 0701  04/07 0700 04/07 0701  04/08 0700 04/08 0701  04/09 0700    P.O.   360    IV Piggyback  500     Total Intake(mL/kg)  500 (4.2) 360 (3)    Net  +500 +360             Intake/Output Summary (Last 24 hours) at 08/27/2022 1221  Last data filed at 08/27/2022 0957  Gross per 24 hour   Intake 860 ml   Output --   Net 860 ml     Net IO Since Admission:  860 mL [08/27/22 1221]     Physical Exam:    General:  No acute distress, AAO x 3, pleasant   HEENT:   Normocephalic, atraumatic, bilateral external ears normal, PERRLA, conjunctiva/corneas clear, moist mucous membranes, no exudates.    Neck:  Normal range of motion, supple. No tenderness, no JVD, no lymphadenopathy.   Respiratory:  CTA B/L, good air flow. No wheezes, rhonchi, or rales.   Heart:   Regular rate and rhythm, S1/ S2 intact, no murmurs/ rubs/ gallops   Abdomen/GI:  Soft. NT/ND, +BS, no G/R/R   Musculoskeletal:  Intact distal pulses,  no tenderness, no cyanosis   Skin:   no rashes/ bruising   Extremities:   no edema. Bilateral pedal pulses +2    Neurologic:   Awake/ Alert & oriented x 3, normal motor and sensory function, no focal deficits     Current Medications:     Scheduled Meds    aspirin  81 mg Oral Daily    atorvastatin  40 mg Oral Daily    empagliflozin  10 mg Oral Daily    enoxaparin (LOVENOX) injection  40 mg Subcutaneous Q24H SCH    gabapentin  100 mg Oral BID    insulin lispro  2-12 Units (Medium) Subcutaneous With meals & nightly    losartan  50 mg Oral Daily    metFORMIN  500 mg Oral BID with meals    metoprolol tartrate  25 mg Oral BID     Infusing Meds     PRN Meds   acetaminophen, 650 mg, Q4H PRN  dextrose, 50 mL, Q15 Min PRN   Or  dextrose,  250 mL, Q15 Min PRN   Or  glucagon (rDNA), 1 mg, Daily PRN  docusate sodium, 100 mg, Daily PRN  glucose, 15 grams of glucose, Q15 Min PRN  ondansetron, 4 mg, Q4H PRN  oxyCODONE, 5 mg, Q6H PRN  polyethylene glycol, 17 g, Daily PRN  sodium chloride, 3 mL, Q8H PRN          [facility] (last 7 days):      ABG     Anemia     CBC   Results from last 7 days   Lab Units 08/27/22  0928 08/26/22  1421   WBC AUTO 10*3/uL 4.47 4.88   RBC AUTO 10*6/uL 4.49 4.40   HEMOGLOBIN g/dL 16.1 09.6*   HEMATOCRIT % 41.6 40.5   MCV fL 92.7 92.0   MCH pg 30.5 30.5   MCHC g/dL 04.5* 40.9*   RDW % 81.1 12.7   PLATELETS AUTO 10*3/uL 198 216   MPV fL 10.1 10.4   NEUTROPHILS  RELATIVE % 55.2 56.4   LYMPHOCYTES RELATIVE % 33.3 34.0   MONOCYTES RELATIVE % 8.7 6.8   EOSINOPHILS RELATIVE % 2.0 1.8   BASOPHILS RELATIVE % 0.4 0.6   NEUTROS ABS 10*3/uL 2.5 2.8   LYMPHS ABS AUTO 10*3/uL 1.5 1.7   MONOS ABS AUTO 10*3/uL 0.4 0.3   EOS ABS AUTO 10*3/uL 0.1 0.1   BASOS ABS AUTO 10*3/uL 0.0 0.0     CMP   Results from last 7 days   Lab Units 08/27/22  0928 08/26/22  1421   SODIUM mmol/L 136 138   POTASSIUM mmol/L 4.3 4.0   CHLORIDE mmol/L 101 102   CO2 mmol/L 28.8 28.7   BUN mg/dL 91.4 78.2   CREATININE mg/dL 9.56 2.13   GLOMERULAR FILTRATION RATE mL/min/1.36m*2 >90.0 >90.0   AST U/L 77* 74*   ALT U/L 120* 116*   ALK PHOS U/L 67 69   ALBUMIN g/dL 3.1* 3.2*   MAGNESIUM mg/dL 0.86  --    LIPASE U/L  --  31   BILIRUBIN TOTAL mg/dL 0.3 0.3   CALCIUM UA mg/dL 9.0 8.9     Results from last 7 days   Lab Units 08/27/22  1122 08/27/22  0928 08/27/22  0759 08/26/22  1421   POC GLUCOSE mg/dL 578*  --  469*  --    GLUCOSE mg/dL  --  629*  --  528*     Cardiac   Results from last 7 days   Lab Units 08/27/22  0928 08/26/22  1917 08/26/22  1421   HS TROPONIN I ng/L 69 66 67     Coagulation   Results from last 7 days   Lab Units 08/26/22  1421   PROTIME seconds 11.1   INR  1.01   PTT seconds 26.3*     Drug Tox Screen     Thyroid Function / HGBA1C   Results from last 7 days   Lab Units 08/27/22  0928   TSH uIU/mL 1.200   HEMOGLOBIN A1C % 11.2*     Urine       Micro   No results found for: "BLOODCX", "CATHETERCX", "CLOSTD", "GRAMSTAIN", "MRSACULTURE", "RESPCULTURE", "SPUTUMCULTUR", "URINECX"       Imaging   X-ray Chest 1 View    Result Date: 08/26/2022  PROCEDURE: XR CHEST 1 VIEW INDICATION: Chest Pain, COMPARISON: None. FINDINGS: Lines and Tubes: None. Heart and Mediastinum: The heart and mediastinal contours appear unremarkable. Lungs and Pleura: The lungs are clear.  There are no pleural effusions. Skeletal Structures: Stable postsurgical changes in the sternum including median sternotomy wires and a fixation  hardware. No acute osseous findings.     No acute pulmonary process.         X-ray Chest 1 View    Result Date: 08/26/2022  No acute pulmonary process.           Hospital Problem List:    Chest pain    #1 atypical/noncardiac chest pain.  Patient need to have pain management by pain specialist.  Doubt cardiac origin for that current chest pain.    2.  Hypertension continue scar medication.    3.  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  Patient probably will need nuclear stress test as an outpatient.    Patient is stable cardiac wise to be discharged.    Author: Koleen Nimrod, MD     During the encounter.  I have discussed the case with the patient thoroughly.  As well as with the admitting team attending and residents.  I have personally reviewed images.  EKGs.  Thoroughly checked telemetry.  Check patient labs.  Old records if needed.  Also I have discussed with the nursing staff about the development over the past 24 hours.  Please note that the chart has also been partially dictated, and there may be clerical errors.  The note may not reflect the data known because of improper importation          Electronically signed by Koleen Nimrod, MD at 08/27/2022  9:26 AM PDT

## 2022-08-30 LAB — BMP (EXT)
Anion Gap (EXT): 6 mmol/L (ref 6–14)
BUN (EXT): 10 mg/dL (ref 9–23)
CO2 (EXT): 29 mmol/L (ref 20–31)
CalciumCalcium (EXT): 10.2 mg/dL (ref 8.3–10.6)
Chloride (EXT): 103 mmol/L (ref 98–107)
Creatinine (EXT): 0.74 mg/dL (ref 0.73–1.18)
Glucose (EXT): 168 mg/dL — ABNORMAL HIGH (ref 74–106)
Potassium (EXT): 3.7 mmol/L (ref 3.5–5.1)
Sodium (EXT): 138 mmol/L (ref 136–145)
eGFR - Creat MDRD (EXT): 115 ml/min/1.73m2 (ref >60)

## 2022-08-30 LAB — UNMAPPED LAB RESULTS: Phosphorous (EXT): 4.6 mg/dL (ref 2.4–5.1)

## 2022-08-31 LAB — CMP (EXT)
A/G Ratio (EXT): 0.9 ug/mL — ABNORMAL LOW (ref 1.2–2.4)
ALT/SGPT (EXT): 118 U/L — ABNORMAL HIGH (ref 7.0–40.0)
AST/SGOT (EXT): 65 U/L — ABNORMAL HIGH (ref 13–40)
Albumin (EXT): 3.4 g/dL (ref 3.4–5.0)
Alkaline Phosphatase (EXT): 73 U/L (ref 46.0–116.0)
Anion Gap (EXT): 5 mmol/L — ABNORMAL LOW (ref 6–14)
BUN (EXT): 15 mg/dL (ref 9–23)
Bilirubin, Total (EXT): 0.3 mg/dL (ref 0.3–1.2)
CO2 (EXT): 30 mmol/L (ref 20–31)
CalciumCalcium (EXT): 9.6 mg/dL (ref 8.3–10.6)
Chloride (EXT): 101 mmol/L (ref 98–107)
Creatinine (EXT): 0.73 mg/dL (ref 0.73–1.18)
Globulin (EXT): 3.8 g/dL (ref 2.0–3.9)
Glucose (EXT): 280 mg/dL — ABNORMAL HIGH (ref 74–106)
Potassium (EXT): 4.1 mmol/L (ref 3.5–5.1)
Protein (EXT): 7.2 g/dL (ref 5.7–8.2)
Sodium (EXT): 136 mmol/L (ref 136–145)
eGFR - Creat MDRD (EXT): 115 ml/min/1.73m2 (ref >60)

## 2022-08-31 LAB — HEMOGLOBIN A1C
Estimated Average Glucose mg/dL (INT/EXT): 278 mg/dL
Estimated Average Glucose mmol/L (INT/EXT): 15 mmol/L
HEMOGLOBIN A1C % (INT/EXT): 11.3 % — ABNORMAL HIGH (ref 4.5–6.0)

## 2022-09-19 LAB — CMP (EXT)
A/G Ratio (EXT): 1.2 (calc) (ref 1.0–2.5)
ALT/SGPT (EXT): 108 U/L — ABNORMAL HIGH (ref 9–46)
AST/SGOT (EXT): 61 U/L — ABNORMAL HIGH (ref 10–40)
Albumin (EXT): 4.2 g/dL (ref 3.6–5.1)
Alkaline Phosphatase (EXT): 62 U/L (ref 36–130)
BUN (EXT): 12 mg/dL (ref 7–25)
Bilirubin, Total (EXT): 0.6 mg/dL (ref 0.2–1.2)
CO2 (EXT): 23 mmol/L (ref 20–32)
CalciumCalcium (EXT): 9.9 mg/dL (ref 8.6–10.3)
Chloride (EXT): 101 mmol/L (ref 98–110)
Creatinine (EXT): 0.8 mg/dL (ref 0.60–1.29)
Globulin (EXT): 3.6 g/dL (ref 1.9–3.7)
Glucose (EXT): 376 mg/dL — ABNORMAL HIGH (ref 65–99)
Potassium (EXT): 4 mmol/L (ref 3.5–5.3)
Protein (EXT): 7.8 g/dL (ref 6.1–8.1)
Sodium (EXT): 134 mmol/L — ABNORMAL LOW (ref 135–146)
eGFR - Creat CKD-EPI (EXT): 112 mL/min/{1.73_m2} (ref >=60)

## 2022-09-19 LAB — HEMOGLOBIN A1C: HEMOGLOBIN A1C % (INT/EXT): 11.7 %{Hb} — ABNORMAL HIGH (ref <5.7)

## 2022-09-26 LAB — CMP (EXT)
A/G Ratio (EXT): 0.9 ug/mL — ABNORMAL LOW (ref 1.2–2.4)
ALT/SGPT (EXT): 173 U/L — ABNORMAL HIGH (ref 7.0–40.0)
AST/SGOT (EXT): 112 U/L — ABNORMAL HIGH (ref 13–40)
Albumin (EXT): 3.9 g/dL (ref 3.4–5.0)
Alkaline Phosphatase (EXT): 72 U/L (ref 46.0–116.0)
Anion Gap (EXT): 7 mmol/L (ref 6–14)
BUN (EXT): 9 mg/dL (ref 9–23)
Bilirubin, Total (EXT): 0.4 mg/dL (ref 0.3–1.2)
CO2 (EXT): 24 mmol/L (ref 20–31)
CalciumCalcium (EXT): 9 mg/dL (ref 8.3–10.6)
Chloride (EXT): 102 mmol/L (ref 98–107)
Creatinine (EXT): 0.52 mg/dL — ABNORMAL LOW (ref 0.73–1.18)
Globulin (EXT): 4.3 g/dL — ABNORMAL HIGH (ref 2.0–3.9)
Glucose (EXT): 513 mg/dL — CR (ref 74–106)
Potassium (EXT): 4.3 mmol/L (ref 3.5–5.1)
Protein (EXT): 8.2 g/dL (ref 5.7–8.2)
Sodium (EXT): 133 mmol/L — ABNORMAL LOW (ref 136–145)
eGFR - Creat MDRD (EXT): 127 ml/min/1.73m2 (ref >60)

## 2022-09-28 LAB — BMP (EXT)
Anion Gap (EXT): 5 mmol/L — ABNORMAL LOW (ref 6–14)
BUN (EXT): 12 mg/dL (ref 9–23)
CO2 (EXT): 28 mmol/L (ref 20–31)
CalciumCalcium (EXT): 9 mg/dL (ref 8.3–10.6)
Chloride (EXT): 101 mmol/L (ref 98–107)
Creatinine (EXT): 0.6 mg/dL — ABNORMAL LOW (ref 0.73–1.18)
Glucose (EXT): 519 mg/dL — CR (ref 74–106)
Potassium (EXT): 3.8 mmol/L (ref 3.5–5.1)
Sodium (EXT): 134 mmol/L — ABNORMAL LOW (ref 136–145)
eGFR - Creat MDRD (EXT): 122 ml/min/1.73m2 (ref >60)

## 2022-10-02 LAB — CMP (EXT)
A/G Ratio (EXT): 0.9 ug/mL — ABNORMAL LOW (ref 1.2–2.4)
ALT/SGPT (EXT): 184 U/L — ABNORMAL HIGH (ref 7.0–40.0)
AST/SGOT (EXT): 84 U/L — ABNORMAL HIGH (ref 13–40)
Albumin (EXT): 3.6 g/dL (ref 3.4–5.0)
Alkaline Phosphatase (EXT): 84 U/L (ref 46.0–116.0)
Anion Gap (EXT): 8 mmol/L (ref 6–14)
BUN (EXT): 12 mg/dL (ref 9–23)
Bilirubin, Total (EXT): 0.3 mg/dL (ref 0.3–1.2)
CO2 (EXT): 25 mmol/L (ref 20–31)
CalciumCalcium (EXT): 9.1 mg/dL (ref 8.3–10.6)
Chloride (EXT): 103 mmol/L (ref 98–107)
Creatinine (EXT): 0.64 mg/dL — ABNORMAL LOW (ref 0.73–1.18)
Globulin (EXT): 4.1 g/dL — ABNORMAL HIGH (ref 2.0–3.9)
Glucose (EXT): 360 mg/dL — ABNORMAL HIGH (ref 74–106)
Potassium (EXT): 3.6 mmol/L (ref 3.5–5.1)
Protein (EXT): 7.7 g/dL (ref 5.7–8.2)
Sodium (EXT): 136 mmol/L (ref 136–145)
eGFR - Creat MDRD (EXT): 120 ml/min/1.73m2 (ref >60)

## 2022-10-21 LAB — UNMAPPED LAB RESULTS

## 2022-10-21 LAB — LDL (EXT): LDL Cholesterol (EXT): 144 mg/dL — ABNORMAL HIGH (ref <130)

## 2022-10-21 NOTE — Consults (Signed)
Cardiology Consult      Primary Care Provider: Odella Aquas    Reason for Consult: Chest pain    Date of Service: 10/21/2022    History of Present Illness:    I saw [name] in CV consultation secondary to chest pain    47 yo male with past medical history of anomalous RCA status post CABG 2007.  Followed by failed graft. Status post another open heart surgery with LIMA to  the RCA.    He has chronic chest pain syndrome with multiple hospital evaluations and  requiring narcotics for relief    Reports recurrent chest pain syndrome.  Constant chest squeezing radiating to  left arm lasting minutes to hours.  Non-exertional.  Non-pleuritic.  Not  positional.  NO associated symptoms.  Improved with narcotics    Denies PND, orthopnea, edema    Denies palpitations presyncope syncope    ECG:  NSR.  Possible LVH.  Nonspecific Lateral T abn    Troponin neg x 2    AST/ALT 55/100    TG 321, TC 203, HDL 32, LDL 144    CTA Chest/Abd:  IMPRESSION:  1. No evidence of aortic dissection.  2. Multi lobar ground-glass airspace disease, of uncertain  significance.  Plain film follow-up recommended.  3. Occluded right external iliac artery with reconstitution of the  right common femoral artery through collaterals.  4. The pulmonary arterial tree is grossly patent  with no evidence of filling defect or cut off.  The smallest  subsegmental pulmonary arteries are not well assessed due to  technical factors.      Echo 08/27/2022:  Conclusion  The left ventricular systolic function is normal.  Paradoxical septal motion consistent with post-operative state.  LVEF is within normal limits.  LVEF is 60-65%.  Mild to moderate eccentric mitral regurgitation      Nuclear stress 04/11/2022:  Normal.  NO ischemia/infarction.  EF 65%          Review of Systems  Constitutional:  Negative for activity change, diaphoresis, fatigue and fever.  HENT:  Negative for nosebleeds, sinus pain, sore throat and trouble swallowing.  Eyes:  Negative for  photophobia, pain and visual disturbance.  Respiratory:  Negative for cough, chest tightness, shortness of breath and  wheezing.  Cardiovascular:  Positive for chest pain. Negative for palpitations and leg  swelling.  Gastrointestinal:  Negative for abdominal distention, abdominal pain, blood in  stool, nausea and vomiting.  Musculoskeletal:  Negative for arthralgias, joint swelling and myalgias.  Skin:  Negative for pallor, rash and wound.  Neurological:  Negative for dizziness, syncope, weakness, light-headedness,  numbness and headaches.  Psychiatric/Behavioral:  Negative for confusion and sleep disturbance. The  patient is not nervous/anxious.      Past Medical History[1]    Social History[2]    Family History[3]    Allergies[4]    Scheduled Medications[5]  PRN Medications[6]  Infusions Meds[7]    Temp:  [97 F (36.1 C)-97.8 F (36.6 C)] 97.3 F (36.3 C)  Heart Rate:  [63-93] 72  Resp:  [14-28] 18  BP: (114-162)/(69-91) 114/75  SpO2:  [97 %-99 %] 98 %  Weight change:  No intake/output data recorded.    Physical Exam  Vitals reviewed.  Constitutional:     General: He is not in acute distress.     Appearance: Normal appearance. He is well-developed.  HENT:     Head: Normocephalic and atraumatic.     Right Ear: Hearing normal.  Left Ear: Hearing normal.     Mouth/Throat:     Mouth: Mucous membranes are not pale and not cyanotic.  Eyes:     Conjunctiva/sclera: Conjunctivae normal.     Pupils: Pupils are equal, round, and reactive to light.  Neck:     Vascular: Normal carotid pulses. No carotid bruit, hepatojugular reflux or  JVD.  Cardiovascular:     Rate and Rhythm: Normal rate and regular rhythm.     Pulses:       Carotid pulses are 2+ on the right side and 2+ on the left side.       Radial pulses are 2+ on the right side and 2+ on the left side.       Dorsalis pedis pulses are 2+ on the right side and 2+ on the left side.       Posterior tibial pulses are 2+ on the right side and 2+ on the left side.      Heart sounds: Normal heart sounds, S1 normal and S2 normal. No murmur heard.     No friction rub. No gallop.  Pulmonary:     Effort: No respiratory distress.     Breath sounds: Normal breath sounds. No wheezing, rhonchi or rales.  Abdominal:     General: Bowel sounds are normal.     Palpations: Abdomen is soft.     Tenderness: There is no abdominal tenderness. There is no rebound.  Musculoskeletal:     General: No tenderness. Normal range of motion.     Cervical back: Neck supple.  Skin:     General: Skin is warm.     Coloration: Skin is not pale.  Neurological:     Mental Status: He is alert and oriented to person, place, and time.     Cranial Nerves: No cranial nerve deficit.     Sensory: No sensory deficit.     Coordination: Coordination normal.  Psychiatric:     Speech: Speech normal.     Behavior: Behavior normal. Behavior is cooperative.        Laboratory Data:  Lab Results  Component Value Date   WBC 4.66 10/21/2022   HGB 13.7 10/21/2022   HCT 41.8 10/21/2022   MCV 91 10/21/2022   PLT 179 10/21/2022    Lab Results  Component Value Date   GLUCOSE 462 (H) 10/21/2022   CALCIUM 9.5 10/21/2022   NA 134 10/21/2022   K 4.5 10/21/2022   CO2 27 10/21/2022   CL 99 10/21/2022   BUN 12 10/21/2022   CREATININE 1.00 10/21/2022    Lab Results  Component Value Date   ALT 100 (H) 10/21/2022   AST 55 (H) 10/21/2022   ALKPHOS 70 10/21/2022   BILITOT 0.3 10/21/2022    Beta-Natriuretic Peptide (BNP)  Date Value Ref Range Status  01/10/2022 19 0 - 50 pg/mL Final    No results found for: "DIGOXIN"  INR  Date Value Ref Range Status  03/26/2022 1.1 <1.3 Final  08/23/2017 1.0 0.9 - 1.1 Final      Diagnostic Data:    See HPI    Impression/Plan:    Chest pain - etiology unlikely secondary to large epicardial or graft disease.  Can not exclude microvascular angina or coronary vasospasm.  Doubt ACS,  pericarditis, PE, aortic dissection.  Can not exclude drug -seeking behavior vs  GI vs musculoskeletal disorder    2.   Anomalous RCA status  post CABG 2007. Followed by failed graft. Status post  another open heart surgery with LIMA to the RCA.      Recommend:  Continue asa + Statin.  Monitor for worsening liver fxn  Continue metoprolol  Consider amlodipine +/- Renolazine for persistent chest pain    Thank you for this interesting consult. We will continue to follow this patient  while they are admitted. Please call us with any questions or concerns.      [1]  Past Medical History:  Diagnosis Date   Chest pain   numerous ER visits and extensive workup.   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 s/p right internal mammary artery grafting to distal RCA. 04/2011 [place]  cath: atretic RIMA graft to the RCA with a 70% stenosis.  Left AMA.  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   Current smoker   MI (myocardial infarction)   Mixed hyperlipidemia   Narcotic abuse, episodic   since 04/2016 per [facility]: 24 short prescriptions for painkillers from 22  providers and 18 pharmacies.   Nonadherence to medical treatment  [2]  Social History  Socioeconomic History   Marital status: Single  Tobacco Use   Smoking status: Every Day    Current packs/day: 0.25    Types: Cigarettes   Smokeless tobacco: Never  Substance and Sexual Activity   Alcohol use: No   Drug use: Not Currently    Comment: denies (06/26/2018)  [3]  Family History  Problem Relation Name Age of Onset   Heart attack Mother  [4]  Allergies  Allergen Reactions   Codeine Rash, Hives and Unknown    Denies allergy   Denies allergy   hives   Ibuprofen Rash, Anaphylaxis, Hives and Unknown    anaphylaxis   anaphylaxis   Ketorolac Rash, Anaphylaxis and Hives    Anaphylaxis    Anaphylaxis   Nitroglycerin Other (See Comments), Hives, Rash and Unknown    "syncope and hives"    Patient states "passess out"   States drops his BP  "too much"   "syncope and hives"   Patient states "passess out"   States drops his BP "too much"   "syncope and hives"   Patient states "passess out"   States drops his BP "too much"    hives   Tramadol Rash, Anaphylaxis, Hives and Unknown    Tolerated IV morphine 01/06/18   anaphylaxis   anaphylaxis    Patient confirmed that he can take Percocet, Hydromorphone and Morphine. [name], Pharmacy  [5] aspirin, 324 mg, Oral, Daily  atorvaSTATin, 80 mg, Oral, QHS  enoxaparin, 40 mg, Subcutaneous, Q24H SCH  insulin glargine, 28 Units, Subcutaneous, QHS (2200)  insulin lispro, 0-12 Units, Subcutaneous, TID (0800,1200,1700)  metoprolol tartrate, 12.5 mg, Oral, BID  sodium chloride, 3 mL, Intravenous, Q12H SCH  [6]   glucose **OR** dextrose oral gel **OR** dextrose 50% **OR** dextrose 50%  **OR** glucagon (human recombinant)    HYDROmorphone    Insert and Maintain Peripheral IV **AND** sodium chloride **AND** sodium  chloride  [7]          Electronically Signed by Lorenz Coaster on 10/21/2022 12:48

## 2022-10-22 LAB — HEMOGLOBIN A1C CARE EVERYWHERE
ESTIMATED AVERAGE GLUCOSE CARE EVERYWHERE: 315 mg/dL
HEMOGLOBIN A1C CARE EVERYWHERE: 12.6 % — ABNORMAL HIGH (ref 4.6–5.6)

## 2022-11-03 LAB — BMP (EXT)
Anion Gap (EXT): 10 (ref 3–13)
BUN (EXT): 7 mg/dL (ref 6–24)
BUN/CREAT Ratio (EXT): 10
CO2 (EXT): 23 meq/L (ref 20–29)
CalciumCalcium (EXT): 9.9 mg/dL (ref 8.4–10.2)
Chloride (EXT): 103 meq/L (ref 98–110)
Creatinine (EXT): 0.69 mg/dL (ref 0.64–1.27)
Glucose (EXT): 291 mg/dL — ABNORMAL HIGH (ref 67–99)
Potassium (EXT): 3.8 meq/L (ref 3.6–5.1)
Sodium (EXT): 136 meq/L (ref 135–145)
eGFR - Creat CKD-EPI (EXT): 116 mL/min/{1.73_m2} (ref >90)

## 2022-11-07 LAB — BMP (EXT)
Anion Gap (EXT): 12 (ref 3–13)
BUN (EXT): 11 mg/dL (ref 6–24)
BUN/CREAT Ratio (EXT): 17
CO2 (EXT): 19 meq/L — ABNORMAL LOW (ref 20–29)
CalciumCalcium (EXT): 8.6 mg/dL (ref 8.4–10.2)
Chloride (EXT): 104 meq/L (ref 98–110)
Creatinine (EXT): 0.63 mg/dL — ABNORMAL LOW (ref 0.64–1.27)
Glucose (EXT): 301 mg/dL — ABNORMAL HIGH (ref 67–99)
Sodium (EXT): 135 meq/L (ref 135–145)
eGFR - Creat CKD-EPI (EXT): 120 mL/min/{1.73_m2} (ref >90)

## 2022-11-08 LAB — HEPATITIS C ANTIBODY CARE EVERYWHERE: HEPATITIS C ANTIBODY CARE EVERYWHERE: REACTIVE — AB

## 2022-11-11 LAB — BMP (EXT)
Anion Gap (EXT): 9 (ref 3–13)
BUN (EXT): 11 mg/dL (ref 6–24)
BUN/CREAT Ratio (EXT): 17
CO2 (EXT): 20 meq/L (ref 20–29)
CalciumCalcium (EXT): 9.3 mg/dL (ref 8.4–10.2)
Chloride (EXT): 107 meq/L (ref 98–110)
Creatinine (EXT): 0.65 mg/dL (ref 0.64–1.27)
Glucose (EXT): 189 mg/dL — ABNORMAL HIGH (ref 67–99)
Sodium (EXT): 136 meq/L (ref 135–145)
eGFR - Creat CKD-EPI (EXT): 118 mL/min/{1.73_m2} (ref >90)

## 2022-11-11 LAB — UNMAPPED LAB RESULTS
AST/SGOT (EXT)T: 43 IU/L (ref 12–52)
Potassium (EXT): 3.6 meq/L (ref 3.6–5.1)

## 2022-11-11 LAB — PROTHROMBIN TIME CARE EVERYWHERE
INR CARE EVERYWHERE: 1 (ref 0.8–1.2)
PROTHROMBIN TIME CARE EVERYWHERE: 11.3 s (ref 10.0–13.0)

## 2022-11-11 LAB — APTT CARE EVERYWHERE: APTT CARE EVERYWHERE: 27 s (ref 24.0–37.0)

## 2022-11-11 LAB — TYPE AND SCREEN CARE EVERYWHERE
ABO/RH CARE EVERYWHERE: O POS
ANTIBODY SCREEN CARE EVERYWHERE: NEGATIVE

## 2022-12-03 LAB — BMP (EXT)
Anion Gap (EXT): 10 (ref 5–15)
BUN (EXT): 9 mg/dL (ref 7–23)
CO2 (EXT): 25 mmol/L (ref 24–32)
CalciumCalcium (EXT): 9.4 mg/dL (ref 8.6–10.5)
Chloride (EXT): 106 mmol/L (ref 98–107)
Creatinine (EXT): 0.78 mg/dL (ref 0.60–1.30)
Glucose (EXT): 236 mg/dL — ABNORMAL HIGH (ref 65–99)
Potassium (EXT): 3.7 mmol/L (ref 3.5–5.3)
Sodium (EXT): 141 mmol/L (ref 135–145)
eGFR - Creat CKD-EPI (EXT): >90 mL/min/{1.73_m2} (ref >=60)

## 2022-12-03 LAB — PROTHROMBIN TIME CARE EVERYWHERE
INR CARE EVERYWHERE: 1 (ref 0.9–1.1)
PROTHROMBIN TIME CARE EVERYWHERE: 11 s (ref 9.6–12.4)

## 2022-12-03 LAB — APTT CARE EVERYWHERE: APTT CARE EVERYWHERE: 26.5 s (ref 23.0–32.0)

## 2022-12-03 NOTE — Consults (Addendum)
Consult Note EMR No Cosign  DATE/TIME NOTE CREATED: 12/03/2022 12:12:26  CHIEF COMPLAINT:    Chest pain  REASON FOR CONSULTATION:   Chest Pain  HISTORY OF PRESENT ILLNESS:      This is a 47 yo M with a PMHx of ischemic heart disease s/p (CABG in 2007  and 2012), t2dm, obesity who presents to the hospital with chest pain. The  patient reports that as he got in the car this morning at around 10 AM to  drive to work, he started to feel  a squeezing chest pain radiating from the  left side of his chest down his left arm . He reports that the pain is 10/10,  non-remitting, and worsens with exertion. He also describes associated nausea,  diaphoresis, and palpitations this morning. In the ED he received aspirin and  morphine. Reports allergy to NTG.      He reports that he gets "angina" about 3 times a week on exertion and that  the distance that he can walk without getting angina has lessened. His last  episode of chest pain at rest was a few weeks ago when he woke up from sleep.  He was admitted to [facility] in February 2087for chest pain.      His medical history is significant for two CABG operations (2007, and  2012).      He also reports a sudden cardiac arrest and a subsequent catheterization  when he was 47 yo.      He takes aspirin, atorvastatin, and metoprolol but does not check his  blood pressure at home.      He is an active smoker and has smoked a pack per day since age 47. He has  cut down to 3 cigarettes daily now.      He denies alcohol and drug use.  REVIEW OF SYSTEMS:      As documented in HPI.  PHYSICAL EXAM:   VITAL SIGNS:        Vital Signs:        Last Charted:        24 Hr Minimum:        24 Hr Maximum:        Temperature Temporal        36.3        (07/15 10:35)        36.3        (07/15 10:35)        36.3        (07/15 10:35)        Heart Rate        71        (07/15 12:07)        71        (07/15 12:07)        86        (07/15 10:35)        Cardiac Rhythm        Normal sin         (07/15 12:07)          Respiratory Rate        17        (07/15 12:07)        17        (07/15 12:07)        20        (07/15 10:35)        Systolic BP  173 (H)        (07/15 12:07)        164 (H)        (07/15 10:35)        173 (H)        (07/15 12:07)        Diastolic BP        99 (H)        (07/15 12:07)        93 (H)        (07/15 10:35)        99 (H)        (07/15 12:07)        Mean Arterial Pressure        124        (07/15 12:07)        117        (07/15 10:35)        124        (07/15 12:07)        SpO2/Pulse Oximetry        99        (07/15 12:07)        97        (07/15 10:35)        99        (07/15 12:07)        Dosing BMI        31        (07/15 10:35)        31        (07/15 10:35)        31        (07/15 10:35)      On physical exam he is alert and oriented in NAD, no carotid bruits  appreciated, his heart sounds are difficult to appreciate given body habitus,  lungs are clear to auscultation, abdomen soft non tender and non distended,  there is no evidence of lower ext edema. His skin is warm and well perfused  MEDICAL DECISION MAKING:      This is a 47 yo M with a PMHx of ischemic heart disease s/p CABG, obesity,  and diabetes presents with chest pain. Overall his first troponin is negative.  EKG shows NSR with left atrial enlargement but no ST-T wave abnormalities  concerning for ischemia.      HEART score-4 (moderate risk).      Given his medical history and symptoms of chest pain would recommend  admission for:      1. Obtain 3-hour troponin      2. Echocardiogram      3. Nuclear Exercise stress test      4. Update HbA1C, and lipid panel  ASSESSMENT/PLAN:       1. Chest pain (R07.9)  ADDITIONAL INFORMATION:   Cardiology consult.  Discussed with fellow.  Communicated recommendations to  ER Dr. Freddi Starr   Records reviewed in epic.   Time spent reviewing extensive records in epic.  This was appreciated after I  visited with patient.  This patient actually does not have coronary artery  disease.   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  He then had his native RCA translocated to above the sinotubular junction.  He  had normal coronaries on 2020 catheterization.  He has many ER visits which I  reviewed after visiting with patient.   [facility].  Echocardiogram.  August 27 2022.   The left  ventricular systolic function is normal.   Paradoxical septal motion consistent with post-operative state.   LVEF is within normal limits.   LVEF is 60-65%.   Mild to moderate eccentric mitral regurgitation.   CT angiogram results reviewed [facility].  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   Upper extremity provoked DVT   Reported frequent hospitalizations for chest discomfort   Hepatitis C   Diabetes.  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  He underwent prophylactic CABG with right IMA to distal RCA in 2007.  December 2012 cardiac cath in [place] notable for atretic right IMA graft RCA  with 70% stenosis.  Reportedly left AMA but then evaluated [facility].  He underwent repeat sternotomy and translocation of RCA to above the  sinotubular junction.  Left heart cath 2020.  [facility].  Normal  coronaries.  Translocated RCA had only mild luminal irregularities.   [facility] ER admissions reviewed from December 20, 2021, January 04, 2022, March 12, 2022, November for 2023, March 28, 2022, April 09, 2022, April 25, 2022, May 07, 2022, May 23, 2021, June 13, 2022.   He presents with acute onset rest sharp chest discomfort.  He does report he  is had this discomfort before.  He reports prior medical institutions have not  pursued adequate evaluation of it.  At baseline he denies exertional angina.  He is currently resting in bed and appears comfortable but is complaining of  sharp pain that his left side that goes down his arm.  He reports  this to be a  new discomfort but after reviewing records noted above he does appear to be  somewhat chronic based on medical documentation.   Is a history of diabetes.  His random glucose today is 360.   Family history appears negative for first-degree relatives with premature  coronary disease.   He reports that he works in [employer].  There is active smoking.   Physical exam.  He is currently resting in bed.  Vertical sternotomy scar.  No JVD.  No carotid bruits.  No rub on exam.  Lungs clear to auscultation  bilaterally.  Extremities no edema.    12-lead EKG.  Sinus rhythm.  Left atrial abnormality.  No significant ST-T  wave changes.   Patient wishes to undergo a cardiac evaluation.  After visiting with the  patient he did undergo an echocardiogram in April 2024 with normal LV function  mild to moderate MR.   First troponin is 3.0 range.   Recommendations   In an effort to provide patient more reassurance it seems reasonable to  obtain echocardiogram and exercise nuclear stress test despite lack of of  known coronary disease.  While here he would benefit from diabetes education  and treatment for his diabetes.  He also may have some sort of neuropathic  discomfort related to sternotomy.  May be reasonable to consider a medicine  like gabapentin for neuropathic like chest pain.   Over 17 different outpatient documents reviewed through epic.   Total time spent today on direct patient care for this date of service  including primary review of all relevant clinical data, history and physical  examination, shared decision making, care coordination of documentation.   75 minutes  PROBLEM LIST/PAST MEDICAL HISTORY:   Ongoing      CAD (  coronary artery disease) (Medical)      T2DM (type 2 diabetes mellitus) (Medical)   Historical      No qualifying data    PROCEDURE/SURGICAL HISTORY:     CABG x 3 - Coronary artery bypass grafts x 3  SOCIAL HISTORY:     Home/Environment      Human Trafficking Red Flags None., 01/11/2022      Smoking/Tobacco Use      Smoking tobacco use: Current some day smoker., 01/11/2022     Substance Abuse      Use: Denies use., 01/11/2022  MEDICATIONS:   No qualifying data available  ALLERGIES:       Allergies (5) Active       Reaction       codeine       hives       Motrin       hives       nitroglycerin       hives       Toradol       hives       traMADol       hives  LAB RESULTS:       Hematology Basic - Last 36 hours (0)       Result       Date/Time         Chemistry Comprehensive - Last 36 hours (15)       Result       Date/time       Sodium Lvl       137        07/15 10:56       Potassium Lvl       4.0        07/15 10:56       Chloride Lvl       104        07/15 10:56       CO2       24        07/15 10:56       Glucose Level       360 (H)        07/15 10:56       BUN       10        07/15 10:56       Creatinine Lvl       0.86        07/15 10:56       AGAP       9        07/15 10:56       Total Protein       6.7        07/15 10:56       Albumin Lvl       3.6        07/15 10:56       Calcium Lvl       9.0        07/15 10:56       Alk Phos       55        07/15 10:56       AST       45 (H)        07/15 10:56       ALT       73 (H)        07/15 10:56       Bili Total  0.5        07/15 10:56    RADIOLOGY/DIAGNOSTIC RESULTS:   Radiology - Last 36 hours (1)   XR Chest 1 View (07/15 11:05)   IMPRESSION: No acute cardiopulmonary process.   READING SITE: [facility].     Diagnostics - Non-Radiology (0)   No qualifying data.  History of past RIMA to RCA [place] 2007, Anomalous RCA with atretic RIMA graft  with 70 % stenosis.  Electronically Signed On 12/03/22 12:45 EDT  _____________________________________________   Samule Ohm    Electronically Signed On 12/03/22 12:53 EDT  _____________________________________________   Ignacia Palma MD, Keilani Terrance    Electronically Signed On 12/03/22 13:01 EDT  _____________________________________________   Ignacia Palma MD, Burnis Halling  Electronically Signed On 12/03/22 13:05  EDT  _____________________________________________   Samule Ohm    Electronically Signed On 12/03/22 14:52 EDT  _____________________________________________   Ignacia Palma MD, Oiva Dibari    Patient account number:  [id number]  Ordering physician:  ,    Other providers:  [facility], LISA SOTIR, LISA SOTIR,    No PCP,  ,  ,

## 2022-12-29 LAB — BMP (EXT)
Anion Gap (EXT): 10 mmol/L (ref 3–17)
BUN (EXT): 12 mg/dL (ref 9–23)
CO2 (EXT): 26 mmol/L (ref 20–31)
CalciumCalcium (EXT): 9.8 mg/dL (ref 8.7–10.4)
Chloride (EXT): 107 mmol/L — ABNORMAL HIGH (ref 95–106)
Creatinine (EXT): 0.75 mg/dL (ref 0.50–1.30)
GFR Estimated (Calc) (EXT): 113 mL/min/{1.73_m2} (ref >60)
Glucose (EXT): 126 mg/dL — ABNORMAL HIGH (ref 74–106)
Potassium (EXT): 4.1 mmol/L (ref 3.5–5.2)
Sodium (EXT): 143 mmol/L (ref 136–145)

## 2023-01-13 LAB — PROTHROMBIN TIME CARE EVERYWHERE: INR CARE EVERYWHERE: 1.1 (ref <1.3)

## 2023-01-19 ENCOUNTER — Emergency Department: Admit: 2023-01-20 | Payer: MEDICAID

## 2023-01-19 DIAGNOSIS — R079 Chest pain, unspecified: Secondary | ICD-10-CM

## 2023-01-19 MED ORDER — sodium chloride 0.9 % flush 10 mL
INTRAMUSCULAR | Status: DC | PRN
Start: 2023-01-19 — End: 2023-01-20

## 2023-01-19 MED ORDER — morphine injection 4 mg
4 | Freq: Once | INTRAVENOUS | Status: AC
Start: 2023-01-19 — End: 2023-01-19
  Administered 2023-01-20: 04:00:00 4 mg via INTRAVENOUS

## 2023-01-19 MED ORDER — metoprolol tartrate (Lopressor) tablet 25 mg
50 | Freq: Once | ORAL | Status: AC
Start: 2023-01-19 — End: 2023-01-19
  Administered 2023-01-20: 04:00:00 25 mg via ORAL

## 2023-01-19 MED ORDER — ondansetron (Zofran) injection 4 mg
4 | Freq: Once | INTRAMUSCULAR | Status: AC
Start: 2023-01-19 — End: 2023-01-19
  Administered 2023-01-20: 04:00:00 4 mg via INTRAVENOUS

## 2023-01-19 NOTE — ED Triage Notes (Signed)
BIBA. For the past pt has been experiencing CP radiating down left arm and into jaw. Pt has an extensive cardiac history, CABG at 47yrs old. Pt had bypass in 2007 and 2012. Pt reports a total of 3 MIs.     EMS administered 4 ASA. Pt is allergic to nitro.

## 2023-01-19 NOTE — ED Provider Notes (Signed)
[facility]  [address]  [place] [address]    PATIENT  [name]  DOB: 1975/09/22, MRN: [id number]    DATE OF SERVICE  01/19/2023 10:36 PM    CHIEF COMPLAINT  [name] is a 47 y.o. male with Chest Pain      HISTORY OF PRESENT ILLNESS    History provided by:  Patient and medical records  History limited by:  Acuity of condition  Language interpreter used: No      This is a 47 year old gentleman brought in by ambulance for evaluation of chest pain that began in the center of his chest and radiates into his left arm and jaw. 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 He underwent bypass surgery in 2007 and then had repeat surgery in 2012. Per review of the medical record, including an excellent consult note dated 12/03/2022, patient apparently has had multiple ED encounters for chest pain. He apparently does not have any actual coronary artery disease. 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 The native right coronary artery was translocated to above the sinotubular junction. In 2020 he had normal coronary artery circulation during a catheterization. The translocated RCA had only mild luminal irregularities. He has had multiple ER admissions at [facility] between August 2023 and January 2024. Typically presents with acute sharp chest pain. Pain tonight not associate with any sort of shortness of breath, nausea, or diaphoresis. Denies any difficulty breathing. Denies any abdominal pain. No lower extremity pain or swelling. He is apparently allergic to nitroglycerin as well as to NSAIDs.      REVIEW OF SYSTEMS  Review of Systems   Constitutional:  Negative for activity change, appetite change, chills and fever.   HENT:  Negative for congestion and sore throat.    Eyes:  Negative for visual disturbance.   Respiratory:  Negative for cough and shortness of breath.    Cardiovascular:  Positive for  chest pain. Negative for palpitations.   Gastrointestinal:  Negative for abdominal pain, diarrhea, nausea and vomiting.   Genitourinary:  Negative for dysuria, frequency and urgency.   Skin:  Negative for rash.   Neurological:  Negative for syncope and headaches.   All other systems reviewed and are negative.      PATIENT HISTORY  MEDICAL  Past Medical History:   Diagnosis Date    Coronary artery disease (CMS-HCC)     Diabetes mellitus (Multi-HCC)     Heart attack (Multi-HCC)     x3    Hypertension (CMS-HCC)     pt denies having high BP       SURGICAL  Past Surgical History:   Procedure Laterality Date    ARTERIAL BYPASS SURGERY      x2    CHOLECYSTECTOMY      CORONARY ARTERY BYPASS GRAFT      CTA ABDOMEN PELVIS W AND WO CONTRAST  12/03/2022    CTA ABDOMEN PELVIS W AND WO CONTRAST 12/03/2022       MEDICATIONS  No current facility-administered medications on file prior to encounter.     Current Outpatient Medications on File Prior to Encounter   Medication Sig    aspirin 81 mg capsule Take 81 mg by mouth in the morning.    atorvastatin (Lipitor) 10 mg tablet Take 40 mg by mouth in the morning.    CLINDAMYCIN HCL ORAL Take by mouth.    insulin glargine (Lantus)  100 unit/mL (3 mL) injection Inject 15 Units under the skin at bedtime.    metoprolol succinate XL (Toprol-XL) 50 mg 24 hr tablet Take 25 mg by mouth twice daily.    metoprolol tartrate (Lopressor) 25 mg tablet Take 25 mg by mouth in the morning and 25 mg in the evening.    Ventolin HFA 90 mcg/actuation inhaler Inhale 2 puffs every 6 (six) hours if needed.       ALLERGIES  Allergies   Allergen Reactions    Ibuprofen Anaphylaxis, Hives, Itching and Rash     Other reaction(s): Anaphylactic reaction, Unknown, Urticaria  anaphylaxis  anaphylaxis      Ketorolac Anaphylaxis, Hives and Rash     Other reaction(s): ITCHY HIVES, Unknown, Urticaria  Converted from Generic Allergy: Ketorolac  Anaphylaxis   Anaphylaxis   Tolerates ASA      Nitroglycerin      hives    Tramadol  Anaphylaxis, Angioedema, Hives and Rash     Other reaction(s): THROAT CLOSES, Unknown, Urticaria  Patient confirmed that he can take Percocet, Hydromorphone and Morphine. [name], Pharmacy  anaphylaxis  anaphylaxis      Codeine        FAMILY  Family History   Problem Relation Name Age of Onset    Heart disease Father         SOCIAL  Social History     Tobacco Use    Smoking status: Every Day     Packs/day: .5     Types: Cigarettes    Smokeless tobacco: Never   Vaping Use    Vaping Use: Not on file   Substance Use Topics    Alcohol use: Never    Drug use: Never       PHYSICAL EXAM  TRIAGE (FIRST SET) VITAL SIGNS  Temp: 36.8 C (98.2 F) Oral  Pulse: 86  BP: (!) 182/94  Resp: 17  SpO2: 98 % Oxygen Therapy: None (Room air)  Glasgow Coma Scale Score: 15    [name] is afebrile, has an elevated BP, with otherwise normal triage vital signs including normal O2 saturation while on room air. [name] was seen in room 3.     Physical Exam  Vitals and nursing note reviewed.   Constitutional:       General: [name] is not in acute distress.     Appearance: Normal appearance. [name] is not ill-appearing.   HENT:      Head: Normocephalic and atraumatic.      Nose: No congestion.      Mouth/Throat:      Mouth: Mucous membranes are moist.   Eyes:      General:         Right eye: No discharge.         Left eye: No discharge.      Conjunctiva/sclera: Conjunctivae normal.   Cardiovascular:      Rate and Rhythm: Normal rate and regular rhythm.      Pulses: Normal pulses.      Heart sounds: Normal heart sounds. No murmur heard.     No friction rub. No gallop.   Pulmonary:      Effort: Pulmonary effort is normal. No respiratory distress.      Breath sounds: Normal breath sounds. No wheezing, rhonchi or rales.   Abdominal:      General: Abdomen is flat. Bowel sounds are normal. There is no distension.      Palpations: Abdomen  is soft.      Tenderness: There is no abdominal tenderness.  There is no guarding or rebound.   Musculoskeletal:         General: Normal range of motion.      Cervical back: Normal range of motion and neck supple.   Skin:     General: Skin is warm and dry.      Capillary Refill: Capillary refill takes less than 2 seconds.      Findings: No rash.   Neurological:      General: No focal deficit present.      Mental Status: RONE LAVELL is alert and oriented to person, place, and time. Mental status is at baseline.   Psychiatric:         Mood and Affect: Mood normal.         Behavior: Behavior normal.         RESULTS  Labs Reviewed   COMPREHENSIVE METABOLIC PANEL - Abnormal       Result Value    Sodium 137      Potassium 3.8      Chloride 105      CO2 (Bicarbonate) 28      Anion Gap 4      BUN 14      Creatinine 0.88      eGFRcr 108      Glucose 194 (*)     Fasting? Unknown      Calcium 9.5      AST 34      ALT 62 (*)     Alkaline phosphatase 59      Protein, total 7.8      Albumin 3.3      Bilirubin, total 0.2     CBC WITH DIFFERENTIAL - Abnormal    WBC 5.4      RBC 4.57      Hemoglobin 13.4      Hematocrit 41.8      MCV 91.5      MCH 29.3      MCHC 32.1      RDW-CV 12.5      RDW-SD 41.4      Platelets 245      MPV 10.3      Neutrophil % 51.1      Lymphocyte % 40.4      Monocytes % 5.6      Eosinophils % 1.9      Basophils % 0.4      Immature Granulocytes % 0.6      NRBC % 0.0      Neutrophils Absolute 2.75      Lymphocytes Absolute 2.17      Monocytes Absolute 0.30 (*)     Eosinophils Absolute 0.10      Basophils Absolute 0.02      Immature Granulocytes Absolute 0.03      NRBC Absolute 0.00     URINE DRUG SCREEN - Abnormal    Amphetamines screen, urine Screen Negative      Barbiturates screen, urine Screen Negative      Benzodiazepines screen, urine Screen Negative      Buprenorphine screen, urine Screen Negative      Cannabinoids screen, urine Screen Negative      Cocaine screen, urine Screen Negative      Ethanol, Urine, Value <3      Fentanyl screen, urine Screen  Negative      Methadone screen, urine Screen Negative      Opiates screen,  urine Screen Negative      Oxycodone screen, urine Presumptive Positive (*)     Creatinine, urine, specimen validity 223.00      Narrative:     These are unconfirmed screening results and should be used only for medical purposes. If the clinical setting requires confirmation, contact the clinical laboratory.                     URINALYSIS ALONE (UTI IS NOT SUSPECTED) - Abnormal    Color, Ur Yellow      Clarity, Ur Clear      Specific Gravity, Ur 1.025      pH, Ur 5.5      Protein,Ur 30 (*)     Glucose,Ur 100 (*)     Ketones, Ur Trace (*)     Bilirubin, Ur Negative      Blood, Ur Negative      Urobilinogen, Ur 1.0      Nitrite, Ur Negative      Leukocyte Esterase, Ur Trace (*)     RBC, Ur 0      WBC, Ur 5      Epithelial Cells, UR 2      Bacteria, Ur None Seen      Casts, Ur 1     TROPONIN I, HIGH SENSITIVITY - Normal    Troponin I, High Sensitivity 51      Narrative:     Clinical correlation, HEART Score and shared decision making must be taken into account.                                                   For Chest Pain >3 Hours                                    Rule-Out Criteria                                              Single Value     0hr/1hr Delta Value                  Male  <10ng/L*          <54ng/L AND delta <15ng/L                  Male    <10ng/L*          <79ng/L AND delta <15ng/L                                    Cannot Rule-Out                                                            0hr/1hr Delta Value                  Male    <54ng/L AND delta >15ng/L    >/=  54 AND delta </=15ng/L                  Male      <79ng/L AND delta >15ng/L    >/= 79 AND delta </=15ng/L                                    Rule-In Criteria                                               Single Value   0hr/1hr Delta Value                                 Male   >115ng/L       >/=54ng/L AND delta >/=15ng/L                         Male      >115ng/L       >/=79ng/L AND delta >/=15ng/L                                           *Note: for Chest pain <3 hours 0hr/1hr is warranted for evaluation.                     NT PRO BNP - Normal    NT-proBNP 79      Narrative:     The 2021 Universal Definition and Classification of Heart Rafael Bihari states that a diagnosis of heart failure (HF) requires symptoms and/or signs of HF caused by structural and/or functional cardiac abnormalities, accompanied by objective evidence of congestion and/or:                                    Test               Ambulatory Patients    Hospitalized/Decompensated                  BNP (pg/mL)         >=35                   >=100                          NT-proBNP (pg/mL)   >=125                  >=300                                                       Persistently elevated BNP or NT-proBNP despite HF therapies is associated with more clinical events. Patients with heart failure with preserved ejection fraction (HFpEF) have lower levels of the natriuretic peptides than patients with a similar severity of heart failure with reduced ejection fraction (HFrEF). NT-proBNP (rather than BNP) is  the biomarker of choice for patients prescribed a neprilysin inhibitor.                                    In dyspneic patients in emergency department settings,2 NT-proBNP cut-offs for diagnosis of acute HF were 450, 900, and 1,800 pg/ml for ages <50, 50 to 26, and >75 years.                                    (1) Bozkurt B et al.  J Cardiac Fail 2021;27:387-413                  (2) Madalyn Rob et al. J Am Coll Cardiol 2018;71:1191-1200.                                       LIPASE - Normal    Lipase 27     MAGNESIUM - Normal    Magnesium 1.9     TROPONIN I, HIGH SENSITIVITY - Normal    Troponin I, High Sensitivity 50      Narrative:     Clinical correlation, HEART Score and shared decision making must be taken into account.                                                   For Chest Pain >3 Hours                                     Rule-Out Criteria                                              Single Value     0hr/1hr Delta Value                  Male  <10ng/L*          <54ng/L AND delta <15ng/L                  Male    <10ng/L*          <79ng/L AND delta <15ng/L                                    Cannot Rule-Out                                                            0hr/1hr Delta Value                  Male    <54ng/L AND delta >15ng/L    >/= 54 AND delta </=15ng/L  Male      <79ng/L AND delta >15ng/L    >/= 59 AND delta </=15ng/L                                    Rule-In Criteria                                               Single Value   0hr/1hr Delta Value                                 Male   >115ng/L       >/=54ng/L AND delta >/=15ng/L                         Male     >115ng/L       >/=79ng/L AND delta >/=15ng/L                                           *Note: for Chest pain <3 hours 0hr/1hr is warranted for evaluation.                     CBC W/DIFF    Narrative:     The following orders were created for panel order CBC and differential.                  Procedure                               Abnormality         Status                                     ---------                               -----------         ------                                     CBC w/ Differential[[id number]]          Abnormal            Final result                                                 Please view results for these tests on the individual orders.   URINE TESTING    Narrative:     The following orders were created for panel order Urine Testing.                  Procedure  Abnormality         Status                                     ---------                               -----------         ------                                     Urinalysis[[id number]]                   Abnormal            Final result                                                  Please view results for these tests on the individual orders.      XR CHEST 2 VIEWS   Final Result   1.  No significant interval change compared to most recent available prior allowing for difference in technique.   2.   No acute disease.               Judd Gaudier, MD 01/20/2023 3:46 AM          EKG  Sinus rhythm at a rate of 83 bpm without acute ischemic ST-T changes. Early repolarization. This was reviewed and interpreted by myself.     Sinus rhythm at a rate of 61 bpm without acute ischemic ST-T changes.    CARDIAC MONITORING  Normal sinus rhythm    PROCEDURE(S)  Procedures    RESTRAINT(S)       ED COURSE/MEDICAL DECISION MAKING  ED Medication Administration from 01/19/2023 2235 to 01/20/2023 9147         Date/Time Order Dose Route Action Action by     01/19/2023 2341 EDT metoprolol tartrate (Lopressor) tablet 25 mg 25 mg oral Given Trabucchi, P     01/19/2023 2341 EDT morphine injection 4 mg 4 mg intravenous Given Trabucchi, P     01/19/2023 2341 EDT ondansetron (Zofran) injection 4 mg 4 mg intravenous Given Trabucchi, P          Diagnoses as of 01/20/23 0619   Chest pain, unspecified type     Medical Decision Making  Overall has clinically been stable. Lab and radiology results have been reviewed with the patient. EKGs and troponins without any worrisome findings. He does have an appointment with a new cardiologist next month. I do think it is reasonable for him to be discharged. I do not think that this discomfort this evening was related to new acute coronary syndrome of any sorts. Do not think this was pulmonary embolism or dissection or pneumonia or pneumothorax or other worrisome medical/surgical etiologies.    Problems Addressed:  Chest pain, unspecified type: complicated acute illness or injury    Amount and/or Complexity of Data Reviewed  Labs: ordered. Decision-making details documented in ED Course.  Radiology: ordered. Decision-making details documented in ED Course.  ECG/medicine tests: ordered  and independent interpretation performed. Decision-making details documented in ED Course.  Risk  Prescription drug management.  Parenteral controlled substances.  Decision regarding hospitalization.      Medical Decision Making   Risk of Complications and/or Morbidity   Medication Management: parenteral controlled substances                Alvino Chapel, MD  01/20/23 [phone]

## 2023-01-20 ENCOUNTER — Inpatient Hospital Stay
Admit: 2023-01-20 | Discharge: 2023-01-20 | Disposition: A | Discharge status: Home / Self Care | Payer: MEDICAID | Attending: Emergency Medicine

## 2023-01-20 LAB — URINE DRUG SCREEN
Amphetamines screen, urine: NEGATIVE
Barbiturates screen, urine: NEGATIVE
Benzodiazepines screen, urine: NEGATIVE
Buprenorphine screen, urine: NEGATIVE
Cannabinoids screen, urine: NEGATIVE
Cocaine screen, urine: NEGATIVE
Creatinine, urine, specimen validity: 223 mg/dL (ref >=20.00)
Ethanol, Urine, Value: <3 mg/dL (ref <=10)
Fentanyl screen, urine: NEGATIVE
Methadone screen, urine: NEGATIVE
Opiates screen, urine: NEGATIVE
Oxycodone screen, urine: POSITIVE — AB

## 2023-01-20 LAB — COMPREHENSIVE METABOLIC PANEL
ALT: 62 U/L — ABNORMAL HIGH (ref 0–55)
AST: 34 U/L (ref 6–42)
Albumin: 3.3 g/dL (ref 3.2–5.0)
Alkaline phosphatase: 59 U/L (ref 30–130)
Anion Gap: 4 mmol/L (ref 3–14)
BUN: 14 mg/dL (ref 6–24)
Bilirubin, total: 0.2 mg/dL (ref 0.2–1.2)
CO2 (Bicarbonate): 28 mmol/L (ref 20–32)
Calcium: 9.5 mg/dL (ref 8.5–10.5)
Chloride: 105 mmol/L (ref 98–110)
Creatinine: 0.88 mg/dL (ref 0.55–1.30)
Glucose: 194 mg/dL — ABNORMAL HIGH (ref 70–110)
Potassium: 3.8 mmol/L (ref 3.6–5.2)
Protein, total: 7.8 g/dL (ref 6.0–8.4)
Sodium: 137 mmol/L (ref 135–146)
eGFRcr: 108 mL/min/{1.73_m2} (ref >=60)

## 2023-01-20 LAB — URINALYSIS
Bacteria, Ur: NONE SEEN
Bilirubin, Ur: NEGATIVE
Blood, Ur: NEGATIVE
Casts, Ur: 1 /LPF (ref 0–4)
Epithelial Cells, UR: 2 {cells}/[HPF] (ref 0–5)
Glucose,Ur: 100 mg/dL — AB
Nitrite, Ur: NEGATIVE
Protein,Ur: 30 mg/dL — AB
RBC, Ur: 0 {cells}/[HPF] (ref 0–4)
Specific Gravity, Ur: 1.025 (ref 1.005–1.030)
Urobilinogen, Ur: 1 U/dL (ref 0.2–1.0)
WBC, Ur: 5 {cells}/[HPF] (ref 0–5)
pH, Ur: 5.5 (ref 5.0–8.0)

## 2023-01-20 LAB — CBC WITH DIFFERENTIAL
Basophils %: 0.4 %
Basophils Absolute: 0.02 10*3/uL (ref 0.00–0.22)
Eosinophils %: 1.9 %
Eosinophils Absolute: 0.1 10*3/uL (ref 0.00–0.50)
Hematocrit: 41.8 % (ref 32.0–53.0)
Hemoglobin: 13.4 g/dL (ref 13.0–17.5)
Immature Granulocytes %: 0.6 %
Immature Granulocytes Absolute: 0.03 10*3/uL (ref 0.00–0.10)
Lymphocyte %: 40.4 %
Lymphocytes Absolute: 2.17 10*3/uL (ref 0.70–4.00)
MCH: 29.3 pg (ref 26.0–34.0)
MCHC: 32.1 g/dL (ref 31.0–37.0)
MCV: 91.5 fL (ref 80.0–100.0)
MPV: 10.3 fL (ref 9.1–12.4)
Monocytes %: 5.6 %
Monocytes Absolute: 0.3 10*3/uL — ABNORMAL LOW (ref 0.36–0.83)
NRBC %: 0 % (ref 0.0–0.0)
NRBC Absolute: 0 10*3/uL (ref 0.00–2.00)
Neutrophil %: 51.1 %
Neutrophils Absolute: 2.75 10*3/uL (ref 1.50–7.95)
Platelets: 245 10*3/uL (ref 150–400)
RBC: 4.57 M/uL (ref 4.20–5.90)
RDW-CV: 12.5 % (ref 11.5–14.5)
RDW-SD: 41.4 fL (ref 35.0–51.0)
WBC: 5.4 10*3/uL (ref 4.0–11.0)

## 2023-01-20 LAB — LIGHT BLUE TOP

## 2023-01-20 LAB — MAGNESIUM: Magnesium: 1.9 mg/dL (ref 1.6–2.6)

## 2023-01-20 LAB — TROPONIN I, HIGH SENSITIVITY
Troponin I, High Sensitivity: 51 ng/L
Troponin I, High Sensitivity: 50 ng/L

## 2023-01-20 LAB — RAINBOW DRAW SST GOLD TOP

## 2023-01-20 LAB — NT PRO BNP: NT-proBNP: 79 pg/mL (ref 0–125)

## 2023-01-20 LAB — LIPASE: Lipase: 27 U/L (ref 13–75)

## 2023-01-20 MED FILL — METOPROLOL TARTRATE 50 MG TABLET: 50 50 mg | ORAL | Qty: 1

## 2023-01-20 MED FILL — MORPHINE 4 MG/ML INTRAVENOUS SOLUTION WRAPPER: 4 4 mg/mL | INTRAVENOUS | Qty: 1

## 2023-01-20 MED FILL — ONDANSETRON HCL (PF) 4 MG/2 ML INJECTION SOLUTION: 4 4 mg/2 mL | INTRAMUSCULAR | Qty: 2

## 2023-01-20 NOTE — Other (Signed)
Patient Education  Table of Contents   Nonspecific Chest Pain, Adult    To view videos and all your education online visit,  [email or web address].elsevier.[email or web address]  or scan this QR code with your smartphone.  Access to this content will expire in one year.  Nonspecific Chest Pain, Adult  Chest pain is an uncomfortable, tight, or painful feeling in the chest. The pain can feel like a crushing, aching, or squeezing pressure. A person can feel a burning or tingling sensation. Chest pain can also be felt in your back, neck, jaw, shoulder, or arm. This pain can be worse when you move, sneeze, or take a deep breath.  Chest pain can be caused by a condition that is life-threatening. This must be treated right away. It can also be caused by something that is not life-threatening. If you have chest pain, it can be hard to know the difference, so it is important to get help right away to make sure that you do not have a serious condition.  Some life-threatening causes of chest pain include:   Heart attack.   A tear in the body's main blood vessel (aortic dissection).   Inflammation around your heart (pericarditis).   A problem in the lungs, such as a blood clot (pulmonary embolism) or a collapsed lung (pneumothorax).  Some non life-threatening causes of chest pain include:   Heartburn.   Anxiety or stress.   Damage to the bones, muscles, and cartilage that make up your chest wall.   Pneumonia or bronchitis.   Shingles infection (varicella-zoster virus).  Your chest pain may come and go. It may also be constant. Your health care provider will do tests and other studies to find the cause of your pain. Treatment will depend on the cause of your chest pain.  Follow these instructions at home:  Medicines   Take over-the-counter and prescription medicines only as told by your health care provider.   If you were prescribed an antibiotic medicine, take it as told by your health care provider. Do not stop taking the antibiotic even if you  start to feel better.  Activity   Avoid any activities that cause chest pain.   Do not lift anything that is heavier than 10 lb (4.5 kg), or the limit that you are told, until your health care provider says that it is safe.   Rest as directed by your health care provider.    Return to your normal activities only as told by your health care provider. Ask your health care provider what activities are safe for you.  Lifestyle       Do not use any products that contain nicotine or tobacco, such as cigarettes, e-cigarettes, and chewing tobacco. If you need help quitting, ask your health care provider.   Do not drink alcohol.   Make healthy lifestyle changes as recommended. These may include:  ? Getting regular exercise. Ask your health care provider to suggest some exercises that are safe for you.  ? Eating a heart-healthy diet. This includes plenty of fresh fruits and vegetables, whole grains, low-fat (lean) protein, and low-fat dairy products. A dietitian can help you find healthy eating options.  ? Maintaining a healthy weight.  ? Managing any other health conditions you may have, such as high blood pressure (hypertension) or diabetes.  ? Reducing stress, such as with yoga or relaxation techniques.  General instructions   Pay attention to any changes in your symptoms.   It is up to you to get  the results of any tests that were done. Ask your health care provider, or the department that is doing the tests, when your results will be ready.   Keep all follow-up visits as told by your health care provider. This is important.    You may be asked to go for further testing if your chest pain does not go away.  Contact a health care provider if:   Your chest pain does not go away.   You feel depressed.   You have a fever.   You notice changes in your symptoms or develop new symptoms.  Get help right away if:   Your chest pain gets worse.   You have a cough that gets worse, or you cough up blood.   You have severe pain in your  abdomen.   You faint.   You have sudden, unexplained chest discomfort.   You have sudden, unexplained discomfort in your arms, back, neck, or jaw.   You have shortness of breath at any time.   You suddenly start to sweat, or your skin gets clammy.   You feel nausea or you vomit.   You suddenly feel lightheaded or dizzy.   You have severe weakness, or unexplained weakness or fatigue.   Your heart begins to beat quickly, or it feels like it is skipping beats.  These symptoms may represent a serious problem that is an emergency. Do not wait to see if the symptoms will go away. Get medical help right away. Call your local emergency services (911 in the [place] Do not drive yourself to the hospital.  Summary   Chest pain can be caused by a condition that is serious and requires urgent treatment. It may also be caused by something that is not life-threatening.   Your health care provider may do lab tests and other studies to find the cause of your pain.   Follow your health care provider's instructions on taking medicines, making lifestyle changes, and getting emergency treatment if symptoms become worse.   Keep all follow-up visits as told by your health care provider. This includes visits for any further testing if your chest pain does not go away.  This information is not intended to replace advice given to you by your health care provider. Make sure you discuss any questions you have with your health care provider.  Document Released: 2005-02-14 Document Updated: 2022-03-22 Document Reviewed: 2022-03-22  Elsevier Patient Education ? 2024 Elsevier Inc[employer]

## 2023-01-21 LAB — PROTHROMBIN TIME CARE EVERYWHERE
INR CARE EVERYWHERE: 1.1 (ref <5.0)
PROTHROMBIN TIME CARE EVERYWHERE: 14.2 s (ref 11.9–14.3)

## 2023-01-23 LAB — ESTIMATED GLOMERULAR FILTRATION RATE CARE EVERYWHERE: ESTIMATED GFR CARE EVERYWHERE: >60 mL/min/{1.73_m2} (ref >=60)

## 2023-01-24 LAB — BMP (EXT)
Anion Gap (EXT): 11 — ABNORMAL HIGH (ref 5–10)
BUN (EXT): 10 mg/dL (ref 6–20)
CO2 (EXT): 25 mmol/L (ref 21–31)
CalciumCalcium (EXT): 9.6 mg/dL (ref 8.6–10.3)
Chloride (EXT): 105 mmol/L (ref 98–107)
Creatinine (EXT): 0.93 mg/dL (ref 0.80–1.30)
Glucose (EXT): 232 mg/dL — ABNORMAL HIGH (ref 70–99)
Potassium (EXT): 3.5 mmol/L (ref 3.5–5.1)
Sodium (EXT): 141 mmol/L (ref 136–145)

## 2023-01-28 LAB — HEMOGLOBIN A1C CARE EVERYWHERE
ESTIMATED AVERAGE GLUCOSE CARE EVERYWHERE: 197 mg/dL
HEMOGLOBIN A1C CARE EVERYWHERE: 8.5 % — ABNORMAL HIGH (ref 4.3–5.6)

## 2023-01-31 ENCOUNTER — Inpatient Hospital Stay: Admit: 2023-01-31 | Discharge: 2023-02-01 | Payer: Medicaid - Out of State

## 2023-02-01 ENCOUNTER — Emergency Department: Admit: 2023-02-01 | Payer: Medicaid - Out of State

## 2023-02-01 DIAGNOSIS — I2510 Atherosclerotic heart disease of native coronary artery without angina pectoris: Secondary | ICD-10-CM

## 2023-02-01 DIAGNOSIS — R079 Chest pain, unspecified: Secondary | ICD-10-CM

## 2023-02-01 DIAGNOSIS — Z720 Tobacco use: Secondary | ICD-10-CM

## 2023-02-01 DIAGNOSIS — R0789 Other chest pain: Principal | ICD-10-CM

## 2023-02-01 DIAGNOSIS — Z91018 Allergy to other foods: Secondary | ICD-10-CM

## 2023-02-01 DIAGNOSIS — Z886 Allergy status to analgesic agent status: Secondary | ICD-10-CM

## 2023-02-01 DIAGNOSIS — I249 Acute ischemic heart disease, unspecified: Secondary | ICD-10-CM

## 2023-02-01 DIAGNOSIS — Z888 Allergy status to other drugs, medicaments and biological substances status: Secondary | ICD-10-CM

## 2023-02-01 DIAGNOSIS — R110 Nausea: Secondary | ICD-10-CM

## 2023-02-01 DIAGNOSIS — I10 Essential (primary) hypertension: Secondary | ICD-10-CM

## 2023-02-01 DIAGNOSIS — Z885 Allergy status to narcotic agent status: Secondary | ICD-10-CM

## 2023-02-01 DIAGNOSIS — Z951 Presence of aortocoronary bypass graft: Secondary | ICD-10-CM

## 2023-02-01 DIAGNOSIS — I252 Old myocardial infarction: Secondary | ICD-10-CM

## 2023-02-01 LAB — LIPID PROFILE (EXT)
Chol/HDL Ratio (EXT): 5.4 — ABNORMAL HIGH (ref 0.0–5.0)
Cholesterol (EXT): 150 mg/dL
HDL Cholesterol (EXT): 28 mg/dL — ABNORMAL LOW (ref >=40)
LDL Cholesterol, CALC (EXT): 105 mg/dL — ABNORMAL HIGH
Triglycerides (EXT): 86 mg/dL

## 2023-02-01 LAB — PROTHROMBIN TIME CARE EVERYWHERE
INR CARE EVERYWHERE: 0.98 (ref 0.86–1.12)
PROTHROMBIN TIME CARE EVERYWHERE: 10.9 s (ref 9.6–12.3)

## 2023-02-01 LAB — HEMOGLOBIN A1C CARE EVERYWHERE
ESTIMATED AVERAGE GLUCOSE CARE EVERYWHERE: 209 mg/dL
HEMOGLOBIN A1C CARE EVERYWHERE: 8.9 % — ABNORMAL HIGH (ref 4.0–5.6)

## 2023-02-01 LAB — CBC WITH AUTO DIFFERENTIAL
BKR WAM ABSOLUTE IMMATURE GRANULOCYTES.: 0.02 x 1000/ÂµL (ref 0.00–0.30)
BKR WAM ABSOLUTE LYMPHOCYTE COUNT.: 2.31 x 1000/ÂµL (ref 0.60–3.70)
BKR WAM ABSOLUTE NRBC (2 DEC): 0 x 1000/ÂµL (ref 0.00–1.00)
BKR WAM ANC (ABSOLUTE NEUTROPHIL COUNT): 3.23 x 1000/ÂµL (ref 2.00–7.60)
BKR WAM BASOPHIL ABSOLUTE COUNT.: 0.02 x 1000/ÂµL (ref 0.00–1.00)
BKR WAM BASOPHILS: 0.3 % (ref 0.0–1.4)
BKR WAM EOSINOPHIL ABSOLUTE COUNT.: 0.13 x 1000/ÂµL (ref 0.00–1.00)
BKR WAM EOSINOPHILS: 2.1 % (ref 0.0–5.0)
BKR WAM HEMATOCRIT (2 DEC): 41.4 % (ref 38.50–50.00)
BKR WAM HEMOGLOBIN: 13.9 g/dL (ref 13.2–17.1)
BKR WAM IMMATURE GRANULOCYTES: 0.3 % (ref 0.0–1.0)
BKR WAM LYMPHOCYTES: 37.6 % (ref 17.0–50.0)
BKR WAM MCH (PG): 30.8 pg (ref 27.0–33.0)
BKR WAM MCHC: 33.6 g/dL (ref 31.0–36.0)
BKR WAM MCV: 91.8 fL (ref 80.0–100.0)
BKR WAM MONOCYTE ABSOLUTE COUNT.: 0.44 x 1000/ÂµL (ref 0.00–1.00)
BKR WAM MONOCYTES: 7.2 % (ref 4.0–12.0)
BKR WAM MPV: 10.7 fL (ref 8.0–12.0)
BKR WAM NEUTROPHILS: 52.5 % (ref 39.0–72.0)
BKR WAM NUCLEATED RED BLOOD CELLS: 0 % (ref 0.0–1.0)
BKR WAM PLATELETS: 247 x1000/ÂµL (ref 150–420)
BKR WAM RDW-CV: 12.7 % (ref 11.0–15.0)
BKR WAM RED BLOOD CELL COUNT.: 4.51 M/ÂµL (ref 4.00–6.00)
BKR WAM WHITE BLOOD CELL COUNT: 6.2 x1000/ÂµL (ref 4.0–11.0)

## 2023-02-01 LAB — LIPID PANEL
BKR CHOLESTEROL/HDL RATIO: 5.4 — ABNORMAL HIGH (ref 0.0–5.0)
BKR CHOLESTEROL: 150 mg/dL
BKR HDL CHOLESTEROL: 28 mg/dL — ABNORMAL LOW (ref >=40)
BKR LDL CHOLESTEROL SAMPSON CALCULATED: 105 mg/dL — ABNORMAL HIGH
BKR TRIGLYCERIDES: 86 mg/dL

## 2023-02-01 LAB — HEMOGLOBIN A1C
BKR ESTIMATED AVERAGE GLUCOSE: 209 mg/dL
BKR HEMOGLOBIN A1C: 8.9 % — ABNORMAL HIGH (ref 4.0–5.6)

## 2023-02-01 LAB — COMPREHENSIVE METABOLIC PANEL
BKR A/G RATIO: 1.1 (ref 1.0–2.2)
BKR ALANINE AMINOTRANSFERASE (ALT): 63 U/L — ABNORMAL HIGH (ref 9–59)
BKR ALBUMIN: 4 g/dL (ref 3.6–5.1)
BKR ALKALINE PHOSPHATASE: 62 U/L (ref 9–122)
BKR ANION GAP: 13 (ref 7–17)
BKR ASPARTATE AMINOTRANSFERASE (AST): 42 U/L — ABNORMAL HIGH (ref 10–35)
BKR AST/ALT RATIO: 0.7
BKR BILIRUBIN TOTAL: 0.3 mg/dL (ref <=1.2)
BKR BLOOD UREA NITROGEN: 8 mg/dL (ref 6–20)
BKR BUN / CREAT RATIO: 10 (ref 8.0–23.0)
BKR CALCIUM: 9.6 mg/dL (ref 8.8–10.2)
BKR CHLORIDE: 105 mmol/L (ref 98–107)
BKR CO2: 24 mmol/L (ref 20–30)
BKR CREATININE: 0.8 mg/dL (ref 0.40–1.30)
BKR EGFR, CREATININE (CKD-EPI 2021): >60 mL/min/{1.73_m2} (ref >=60)
BKR GLOBULIN: 3.6 g/dL (ref 2.0–3.9)
BKR GLUCOSE: 137 mg/dL — ABNORMAL HIGH (ref 70–100)
BKR POTASSIUM: 4 mmol/L (ref 3.3–5.3)
BKR PROTEIN TOTAL: 7.6 g/dL (ref 5.9–8.3)
BKR SODIUM: 142 mmol/L (ref 136–144)

## 2023-02-01 LAB — TROPONIN T HIGH SENSITIVITY, 3 HOUR (BH GH LMW YH)
BKR TROPONIN T HS 1 HOUR DELTA FROM 0 HOUR ON 3HR: 5 ng/L
BKR TROPONIN T HS 3 HOUR DELTA FROM 0 HOUR: -1 ng/L
BKR TROPONIN T HS 3 HOUR: 12 ng/L — ABNORMAL HIGH

## 2023-02-01 LAB — TROPONIN T HIGH SENSITIVITY, 1 HOUR WITH REFLEX (BH GH LMW YH)
BKR TROPONIN T HS 1 HOUR DELTA FROM 0 HOUR: 5 ng/L
BKR TROPONIN T HS 1 HOUR: 18 ng/L — ABNORMAL HIGH

## 2023-02-01 LAB — TROPONIN T HIGH SENSITIVITY, 0 HOUR BASELINE WITH REFLEX (BH GH LMW YH): BKR TROPONIN T HS 0 HOUR BASELINE: 13 ng/L — ABNORMAL HIGH

## 2023-02-01 LAB — PROTIME AND INR
BKR INR: 0.98 (ref 0.86–1.12)
BKR PROTHROMBIN TIME: 10.9 s (ref 9.6–12.3)

## 2023-02-01 MED ORDER — ROSUVASTATIN 20 MG TABLET
20 | Freq: Every day | ORAL | Status: DC
Start: 2023-02-01 — End: 2023-02-01

## 2023-02-01 MED ORDER — ONDANSETRON HCL (PF) 4 MG/2 ML INJECTION SOLUTION
4 | Freq: Once | INTRAVENOUS | Status: CP
Start: 2023-02-01 — End: ?
  Administered 2023-02-01: 07:00:00 4 mL via INTRAVENOUS

## 2023-02-01 MED ORDER — METOPROLOL SUCCINATE ER 25 MG TABLET,EXTENDED RELEASE 24 HR
25 | Freq: Every day | ORAL | Status: DC
Start: 2023-02-01 — End: 2023-02-01

## 2023-02-01 MED ORDER — ACETAMINOPHEN 325 MG TABLET
325 | Freq: Once | ORAL | Status: CP
Start: 2023-02-01 — End: ?
  Administered 2023-02-01: 07:00:00 325 mg via ORAL

## 2023-02-01 NOTE — ED Notes
 1610 - report from [facility], pt transferred, care assumed. 0600 - stable, NPO, VSS, blood work sent0700 - Report to incoming RN

## 2023-02-01 NOTE — Utilization Review (ED)
 UM Status: Meets Observation Status Chest pain work up, stress test pendingJulianna DiMichele Eulah Ramirez, BSN, RNUtilization Management

## 2023-02-01 NOTE — Discharge Instructions
 Thank you for allowing [place] to take care of you. We hope that you feel better soon. You came into the emergency room because you had chest pain.While you were here, we did a thorough exam and you had tests which did not show a clear cause of your chest pain. Please follow up with your primary care physician. A copy of your tests is provided with your discharge papers.If you have any problems with your prescriptions, or any problems after discharge, there is a nurse available Monday through Friday from 7:00am-3:30pm at [phone] to help answer any questions. You can also call the Emergency Department directly at [phone].Please follow up with your primary care doctor on the next business day. If you need a primary care provider please try one of the following options: [facility] [phone]), [facility] [phone]), or [facility] [phone]).Sometimes when you leave the emergency department, your symptoms can change or get worse. If this happens, please come back to the emergency department. Additionally, if you have worsening chest pain, develop fever not controlled by tylenol/ibuprofen, confusion or strange behavior, new/increased pain, difficulty breathing, uncontrollable vomiting, or any other new or concerning symptoms, please return to the emergency department immediately for further evaluation.  We are always open.

## 2023-02-01 NOTE — Telephone Encounter
 Received hemoglobin A1c of 8.9.  Noted patient left AMA from chest pain center.  Call placed to number for [facility] and spoke with him.  Hemoglobin A1c was reviewed and he stated he does have diabetes and is on medication for it.  He recently moved to [place] from [place].  He stated he has message uses insurance and needs to ?get it switched over. ?  He was advised to call the number on the back of his insurance card.  He does have a PCP in [place] but was willing to receive a list of providers accepting new patients to his verified e-mail.  E-mail was updated and a list of PCPs as well as the number for [facility] was sent to [place].  He was also notified that he would need to follow-up with his doctor regarding stress testing.  He reported understanding and was appreciative of the call.

## 2023-02-01 NOTE — ED Notes
 7:40 AM Assumed care of patient from previous nurse. Pt is alert and oriented x 4. RA. Pt comes in with midsternal chest pain radiating to the left upper arm. Pt was given aspirin en route by ems. Per report pt to be stressed tested this morning. NPO since midnight on cardiac monitor and has an 18 g. 8:44 AM Pt would leave ama. Pa informed. PIV removed. Social work to come help him with transport. Diet provided. 9:02 AM Pt too anxious to wait for social work in room. Pt ambulated out to waiting room with steady gait on own to wait for social work in ed waiting room. Pt left ama. PA aware, paper work signed and given to pt. Pt educated on risks of leaving Past Medical History: Diagnosis Date  Chronic coronary artery disease   History of single vessel coronary artery bypass   Hypercholesteremia   Hypertension   MI (myocardial infarction) (HC Code) (HC CODE) (HC Code)

## 2023-02-01 NOTE — Utilization Review (ED)
 UM Status: UR Reviewed-Continue Observation Services presented with chest pain. Blood pressure 146/88. Telemetry monitoring ordered. Most recent troponin 12. NPO at midnight, plan for stress test.

## 2023-02-01 NOTE — ED Notes
 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 Social Worker (SW) was consulted by PA to speak with Andie to assist with transportation back to his home in [place] and speaking with him about the possibility of staying in the Emergency Department (ED) to resume his medical treatment. SW attempted to meet with [name] but discovered by triage that patient left the ED on his own. Patient is discharged and has left the AMA. [facility] Medical Team Specific referrals to enhance community supports (include existing and new resources) None Handoff Required? No Next Steps/Plan (including hand-off): Patient is discharged and has left the AMA. Signature: Baldwin Crown, LMSW Contact Information: [phone]

## 2023-02-01 NOTE — ED Notes
 12:08 AM Pt was riding the bus, began having left sided, midsternal CP. CABG in 2012. Denies SOB, N/V3:09 AM first troponin 13, second troponin sent. Pt medicated with tylenol and zofran. Report given to [facility]. Care complete

## 2023-02-01 NOTE — ED Notes
 Received report. Patient awake and alert, not in distress. With IV cannula g18 at right arm. Patient still complaining of same chest pain. Chief Complaint Patient presents with  Chest Pain   +CP x 30 min stabbing in center, radiating down LUE . Hx MI, CABG 2012. +nausea denies sob. EKG NSR for EMS. Hypertensive. Pt received 324 ASA enroute.  Past Medical History: Diagnosis Date  Chronic coronary artery disease   History of single vessel coronary artery bypass   Hypercholesteremia   Hypertension   MI (myocardial infarction) (HC Code) (HC CODE) (HC Code)  5:03 AMTransferred patient to [facility]. Report given to [facility] RN

## 2023-02-01 NOTE — ED Provider Notes
 Chief Complaint Patient presents with  Chest Pain   +CP x 30 min stabbing in center, radiating down LUE . Hx MI, CABG 2012. +nausea denies sob. EKG NSR for EMS. Hypertensive. Pt received 324 ASA enroute.  MDM -----------------------------------------------------------------------Emergency Medicine Resident Note:HPI:Pt is a 47 y.o. male w/ PMH of CAD s/p CABG 2007 + 2012, tobacco use, SUD here today with chest pain. Patient reports his chest pain feels like it did when he had his last heart attack. Feels like pressure and stabbing pain that radiates up his neck and down his arm. Not worse with ambulation. Reports hx of blood clot in leg, but no recent prolonged travel, surgeries, hemoptysis. By chart review, patient has multiple prescriptions for painkillers from over 20 providers across more than 15 pharmacies. Was at [facility] and left AMA prior to [facility] this week. PE:BP (!) 152/91  - Pulse (!) 56  - Temp 98.4 ?F (36.9 ?C) (Temporal)  - Resp (!) 21  - Wt 113.4 kg  - SpO2 94%  - BMI 30.43 kg/m? Medical Decision Making: This patient presents with high risk chest pain. HPI components: No associated diaphoresis, vomiting. Does have radiation to either arm.  No evidence of volume overload or shock on exam. EKG without evidence of STEMI. Low suspicion for acute PE. No evidence of PNA, pneumothorax, thoracic aortic dissection, cardiac effusion / tamponade. Will get cardiac workup to r/o ACS including EKG, trops, CXR, ASA, cardiac monitor; avoid narcotics. ED Course:Clinical Impressions as of 02/01/23 0528 Chest pain, unspecified type Disposition: DischargeI considered Admission/Observation but the patient is stable for discharge. Return precautions extensively discussed, follow-up outpatient. This patient was presented to and discussed with Dr. Janice Norrie and a treatment plan and disposition were collaboratively agreed upon.Azucena Freed, MD PhDPGY2, Emergency MedicineAvailable through MHB9/13/2024 12:09 AMThis note may have been generated using M*Modal voice dictation software please excuse any typographical errors due to flaws in voice recognition.This encounter occurred within the context of the Covid-19 pandemic. -------------------------------------------------------------------------------------------------------------------------------- Physical ExamED Triage Vitals [01/31/23 2355]BP: (!) 169/114Pulse: 72Pulse from  O2 sat: n/aResp: 18Temp: 98.4 ?F (36.9 ?C)Temp src: TemporalSpO2: 99 % BP (!) 152/91  - Pulse (!) 56  - Temp 98.4 ?F (36.9 ?C) (Temporal)  - Resp (!) 21  - Wt 113.4 kg  - SpO2 94%  - BMI 30.43 kg/m? Physical ExamVitals and nursing note reviewed. Constitutional:     General: He is not in acute distress.HENT:    Head: Normocephalic and atraumatic.    Right Ear: External ear normal.    Left Ear: External ear normal.    Nose: Nose normal. Eyes:    Conjunctiva/sclera: Conjunctivae normal. Cardiovascular:    Rate and Rhythm: Normal rate. Pulmonary:    Effort: Pulmonary effort is normal. No respiratory distress. Abdominal:    General: Abdomen is flat. Musculoskeletal:       General: Normal range of motion. Neurological:    Mental Status: He is alert and oriented to person, place, and time. Mental status is at baseline. Psychiatric:       Mood and Affect: Mood normal.  ProceduresAttestation/Critical CarePatient Reevaluation: Attending Supervised: ResidentI saw and examined the patient. I agree with the findings and plan of care as documented in the resident's note except as noted below. Additional acute and/or chronic problems addressed: 64 M hx of CAD s/p CABG, presents with CPOrdering EKG, CXR and labs; r/o ACS, PNATrops 13-18-12Alyssa R FrenchCritical care provided by attending: no critical carePatient progress: stableClinical Impressions as of 02/01/23 0528 Chest pain, unspecified type  ED DispositionObservationADMISSION TO CHEST PAIN OBSERVATIONAdmit to Observation Status: Obs date: 02/01/2023  4:40 AM 113.4 kgThe encounter diagnosis was Chest pain, unspecified type.Risk Factors:  HTN  , Hyperlipidemia  , Diabetes  , and Smoking  Personal History CAD: YesFamily History CAD: NoChest pain resolved before transfer to Chest Pain Obs: AtypicalPCP: No, Pcp (Do Not Change Name)		Informed: NoCardiologist: No primary care provider on file.	Informed: NoMedication order sheet for Chest Pain Observation complete: YesBedside echo completed: NoIf stress test negative, the most probable cause of chest pain is: Chest pain NOS  Roney Marion, MD MPH09/13/24 0033 Azucena Freed, MDResident09/13/24 0040French, Candis Shine, MD MPH09/13/24 0354 Renaye Rakers, MDResident09/13/24 [phone] Roney Marion, MD MPH09/13/24 [phone]

## 2023-02-06 NOTE — Other
 Emergency Department - Chest Pain Center-------------------------------------------------------------------------------------------------HPI: 47 year old male with pmh CAD, CABG 2007,2012 smoker, substance use disorder, HTN, HLD, MI, here with chest pain similar to pain in had with his MI in the past- pressure, stabbing chest radiating to neck and arm- pain has resolved.   He currently lives in [place] and that is where his Cardiologist is (doesn't know his name- new to patient) and would prefer to follow up with them closer to home.Reviewal of ED notes shows pt has been seen in [place] - called a PCP there for refill on oxy and insulin yesterday, multiple ED visits for chest pain last month...01/28/23 note Esperanza Heir Cardiologist [facility] pt awaiting cardiac cath during admissionHPIPMD: Sandrea Hammond Hyannis MA- next appointment scheduled 10/9/24Cardiologist: Possibly in [place] CTPMHx: Past Medical History: Diagnosis Date  Chronic coronary artery disease   History of single vessel coronary artery bypass   Hypercholesteremia   Hypertension   MI (myocardial infarction) (HC Code) (HC CODE) (HC Code)  Family Hx: Social Hx: Social History Socioeconomic History  Marital status: Single Tobacco Use  Smoking status: Every Day Substance and Sexual Activity  Alcohol use: Never  Drug use: Never Pertinent Medications: amlodipine, insulin, atorvastatin, metoprolol, aspirin Cardiac Risks: male, tobacco, known CAD, CABG hx, HTN, HLDPrevious Stress test Hx: 08/27/2022  Stress EchoConclusionThe left ventricular systolic function is normal.Paradoxical septal motion consistent with post-operative state.LVEF is within normal limits.LVEF is 60-65%.Mild to moderate eccentric mitral regurgitation.Exam End: 08/27/22 11:21 AM Previous Cardiac Testing: Condition:Improved________________________________________________________________________________Modified HEART Score:  ________________________________________________________________________________Physical Exam: Vitals:  02/01/23 0730 BP: (!) 146/88 Pulse: 77 Resp: 20 Temp:  General:  Distress: Appears well, no distressHeart:  normal rate and rhythm, no murmur, gallops or rubLungs:  Clear to auscultation bilaterally.  Normal respiratory effortAbdomen:  Abdomen non-distended, normal bowel sounds, soft, no tenderness Neuro: Alert, oriented x3, normal speech, no focal findingsMusculoskeletal: Bilateral lower extremities without edema or tendernessResults:Labs: Chol 150/HDL 28/TG 86/LDL 105--  HS trop 13/18/12Imaging:CXR- right basilar opacity likely bronchvascular crowding vs atelectasis--  ECGs:SR 61 inv T AVL, ST elev V2,V3- unchanged from previous ECGs--  Telemetry:--  Interventions provided for pain:  _______________. Response to management: ImprovedPlan:Discuss with Cards fellow as he seems more appropriate for cardiac cath than stress testing in [facility].Discharge: AMA- pt feels he needs to get back to [facility] where his wife is and Cardiology team.  He demonstrates understanding of risks of discharge without further eval- specifically stress testing or heart cath.  I recommended he follow up asap with his cards/[facility] for recurrent chest pain or return to this ED.Social Work Librarian, academic to help with transportation to [employer]- has $40 but [employer] won't take cash so he plans to find someone who has an Pharmacist, community account and he'd give cash so he can get home.I discussed the case with supervisee, agree with supervisee E/M note with corrections /additions as noted I did not personally see patientKatja Azel Gumina------------------------------------------------------------------------------------------------- Donia Pounds, PA09/14/24 0856 Arther Dames, MD09/18/24 [phone]

## 2023-02-09 ENCOUNTER — Emergency Department: Admit: 2023-02-09 | Payer: MEDICAID

## 2023-02-09 ENCOUNTER — Inpatient Hospital Stay
Admit: 2023-02-09 | Discharge: 2023-02-09 | Disposition: A | Discharge status: Home / Self Care | Payer: MEDICAID | Attending: Emergency Medicine

## 2023-02-09 DIAGNOSIS — R079 Chest pain, unspecified: Secondary | ICD-10-CM

## 2023-02-09 LAB — CBC WITH DIFFERENTIAL
Basophils %: 0.2 %
Basophils Absolute: 0.01 10*3/uL (ref 0.00–0.22)
Eosinophils %: 2.2 %
Eosinophils Absolute: 0.1 10*3/uL (ref 0.00–0.50)
Hematocrit: 41.3 % (ref 32.0–53.0)
Hemoglobin: 13.3 g/dL (ref 13.0–17.5)
Immature Granulocytes %: 0.2 %
Immature Granulocytes Absolute: 0.01 10*3/uL (ref 0.00–0.10)
Lymphocyte %: 29.7 %
Lymphocytes Absolute: 1.38 10*3/uL (ref 0.70–4.00)
MCH: 30.3 pg (ref 26.0–34.0)
MCHC: 32.2 g/dL (ref 31.0–37.0)
MCV: 94.1 fL (ref 80.0–100.0)
MPV: 10.9 fL (ref 9.1–12.4)
Monocytes %: 9.2 %
Monocytes Absolute: 0.43 10*3/uL (ref 0.36–0.83)
NRBC %: 0 % (ref 0.0–0.0)
NRBC Absolute: 0 10*3/uL (ref 0.00–2.00)
Neutrophil %: 58.5 %
Neutrophils Absolute: 2.72 10*3/uL (ref 1.50–7.95)
Platelets: 207 10*3/uL (ref 150–400)
RBC: 4.39 M/uL (ref 4.20–5.90)
RDW-CV: 12.9 % (ref 11.5–14.5)
RDW-SD: 44.1 fL (ref 35.0–51.0)
WBC: 4.7 10*3/uL (ref 4.0–11.0)

## 2023-02-09 LAB — TROPONIN I, HIGH SENSITIVITY
Troponin I, High Sensitivity: 50 ng/L
Troponin I, High Sensitivity: 55 ng/L

## 2023-02-09 LAB — COMPREHENSIVE METABOLIC PANEL
ALT: 60 U/L — ABNORMAL HIGH (ref 0–55)
AST: 49 U/L — ABNORMAL HIGH (ref 6–42)
Albumin: 3.2 g/dL (ref 3.2–5.0)
Alkaline phosphatase: 56 U/L (ref 30–130)
Anion Gap: 2 mmol/L — ABNORMAL LOW (ref 3–14)
BUN: 11 mg/dL (ref 6–24)
Bilirubin, total: 0.4 mg/dL (ref 0.2–1.2)
CO2 (Bicarbonate): 30 mmol/L (ref 20–32)
Calcium: 9.5 mg/dL (ref 8.5–10.5)
Chloride: 104 mmol/L (ref 98–110)
Creatinine: 0.85 mg/dL (ref 0.55–1.30)
Glucose: 109 mg/dL (ref 70–110)
Potassium: 4.2 mmol/L (ref 3.6–5.2)
Protein, total: 7.5 g/dL (ref 6.0–8.4)
Sodium: 136 mmol/L (ref 135–146)
eGFRcr: 109 mL/min/{1.73_m2} (ref >=60)

## 2023-02-09 LAB — LIGHT BLUE TOP

## 2023-02-09 LAB — RAINBOW DRAW SST GOLD TOP

## 2023-02-09 NOTE — ED Provider Notes (Addendum)
[facility]  [address]  [place] [address]    [facility]  [address]  [place] [address]     HPI   Chief Complaint   Patient presents with    Chest Pain        HPI  [name] is a pleasant 47yo M today for chest pain, raise left arm, associated reported nausea, vomiting, occurred when he was standing or sitting on the bus.  Nonexertional.  No history of diabetes with per chart review, poorly controlled A1c over 13, also hepatitis C with positive biomarkers per chart review but he has report he does not have these medical issues.  Today no other abdominal pain, change in urine or stool, said similar previous episodes that he reports that occurs when he has chest pain issues.  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 For this he went on to have surgery at [facility] in 2007 with a right internal mammary graft placed to the distal right coronary. Developed recurrent chest pain prompting repeat catheterization in 2012. At that time the right internal mammary graft was found to be atretic and essentially nonfunctional. Consequently he went on to have repeat open heart surgery at [facility] by Dr. Vincenza Hews. During that procedure the origin of the right coronary was translocated from the right coronary sinus to the proximal portion of the ascending aorta above the annulus.Marland KitchenMarland KitchenNote that in 2018 he underwent cardiac cath for chest pain at [facility] from the right radial artery. The left coronary system was free of disease, and the right coronary artery was challenging and was injected subselectively, but appeared to be without visible stenosis.  "    Catheterization May 2020:  "Left heart cath via left femoral artery  without complication   No visible disease in the left coronary system   Subselective injection of the translocated right coronary shows only mild   luminal irregularities   Normal size aortic root   No attempt was made to inject the right internal mammary graft as it was   known to be atretic and nonfunctional from previous diagnostic cath   Coronary Findings Diagnostic Dominance: Right   Left Main: The vessel is moderate in caliber and is angiographically normal.   Left Anterior Descending: The vessel is moderate in caliber and is angiographically normal.   Left Circumflex: The vessel is moderate in caliber and is angiographically normal.   Right Coronary Artery: The vessel was visualized by non-selective angiography, is moderate in caliber and vessel >=2.00 mm. The vessel exhibits minimal luminal irregularities. Origin of the translocated native right coronary is in the supraannular region of the a sending aorta and is somewhat anterolateral position . The aorta itself is normal in size. Adequate opacification with aortography is achieved . "    CTA Coronaries 04/21/2018 without CAD of all 3 vessels.   Left Main: The vessel is moderate in size. The vessel is   angiographically normal.    Left Anterior Descending: The vessel is moderate in size. The vessel is   angiographically normal.    First Diagonal Branch: The vessel is large. The vessel is   angiographically normal.    Left Circumflex: The vessel is  small. The vessel is angiographically   normal.    First Obtuse Marginal Branch: The vessel is small.    Right Coronary Artery: The vessel is moderate in size. The vessel is   angiographically normal. The vessel originates from the aorta. Reimplanted   anomalous right coronary artery with normal anastomosis to the ascending   aorta.    RIMA Graft to Mid RCA: The graft is small and is occluded .    Left Ventricle: Normal (55-60%) ejection fraction. Normal left   ventricular cavity size and wall thickness.     Right Ventricle: Normal cavity size and ejection fraction.    Aortic Valve: Normal valve structure. No stenosis.    Mitral Valve: Normal valve structure. Normal leaflet mobility.    Aorta: The sinuses of Valsalva are normal in size. The aortic root is   normal in size. The ascending aorta is normal in size. The transverse   aorta is normal in size. The descending aorta is normal in size. No plaque   present.    Pericardium: The pericardium appears normal.. No pericardial effusion.    Pulmonary Angiography: No thrombus present in the main pulmonary   artery.No thrombus present in the right proximal pulmonary artery. No   thrombus present in the left proximal pulmonary artery.             Patient History   Past Medical History:   Diagnosis Date    Coronary artery disease (CMS-HCC)     Diabetes mellitus (Multi-HCC)     Heart attack (Multi-HCC)     x3    Hypertension (CMS-HCC)     pt denies having high BP     Past Surgical History:   Procedure Laterality Date    ARTERIAL BYPASS SURGERY      x2    CHOLECYSTECTOMY      CORONARY ARTERY BYPASS GRAFT      CTA ABDOMEN PELVIS W AND WO CONTRAST  12/03/2022    CTA ABDOMEN PELVIS W AND WO CONTRAST 12/03/2022     Family History   Problem Relation Name Age of Onset    Heart disease Father       Social History     Tobacco Use    Smoking status: Every Day     Packs/day: .5     Types: Cigarettes    Smokeless tobacco: Never   Vaping Use    Vaping Use: Not on file   Substance Use Topics    Alcohol use: Never    Drug use: Never           Physical Exam   ED Triage Vitals [02/09/23 1442]   Temp Pulse Resp BP   37.2 C (99 F) 74 20 (!) 164/91      SpO2 Temp Source Heart Rate Source Patient Position   98 % Oral Monitor Sitting      BP Location FiO2 (%)     Left arm --       Physical Exam  Vitals and nursing note reviewed.   Constitutional:       General: [name] is not in acute distress.     Appearance: [name] is well-developed. [name] is not  ill-appearing, toxic-appearing or diaphoretic.   HENT:      Head: Normocephalic and atraumatic.      Nose: Nose normal.      Mouth/Throat:      Mouth: Mucous membranes are moist.   Eyes:  Extraocular Movements: Extraocular movements intact.      Conjunctiva/sclera: Conjunctivae normal.      Pupils: Pupils are equal, round, and reactive to light.   Cardiovascular:      Rate and Rhythm: Normal rate and regular rhythm.      Pulses: Normal pulses.           Radial pulses are 2+ on the right side and 2+ on the left side.      Heart sounds: Normal heart sounds. No murmur heard.     No gallop.   Pulmonary:      Effort: Pulmonary effort is normal. No respiratory distress.      Breath sounds: Normal breath sounds. No wheezing, rhonchi or rales.   Abdominal:      General: There is no distension.      Palpations: Abdomen is soft.      Tenderness: There is no abdominal tenderness. There is no right CVA tenderness, left CVA tenderness, guarding or rebound.   Musculoskeletal:         General: No swelling or deformity. Normal range of motion.      Cervical back: Normal range of motion and neck supple.      Right lower leg: No edema.      Left lower leg: No edema.   Skin:     General: Skin is warm and dry.      Capillary Refill: Capillary refill takes less than 2 seconds.   Neurological:      General: No focal deficit present.      Mental Status: [name] is alert.      Sensory: No sensory deficit.      Motor: No weakness.   Psychiatric:         Mood and Affect: Mood normal.         Behavior: Behavior normal.       XR CHEST 2 VIEWS   Final Result      1. Patchy right middle lobe airspace disease suspicious for pneumonia in the appropriate clinical setting.   2. Stable moderate cardiomegaly.         Delice Bison, MD 02/09/2023 3:48 PM          Labs Reviewed   CBC W/DIFF    Narrative:     The following orders were created for panel order CBC and differential.  Procedure                               Abnormality          Status                     ---------                               -----------         ------                     CBC w/ Differential[[id number]]                              Final result                 Please view results for these tests on the individual orders.   CBC WITH DIFFERENTIAL  Result Value    WBC 4.7      RBC 4.39      Hemoglobin 13.3      Hematocrit 41.3      MCV 94.1      MCH 30.3      MCHC 32.2      RDW-CV 12.9      RDW-SD 44.1      Platelets 207      MPV 10.9      Neutrophil % 58.5      Lymphocyte % 29.7      Monocytes % 9.2      Eosinophils % 2.2      Basophils % 0.2      Immature Granulocytes % 0.2      NRBC % 0.0      Neutrophils Absolute 2.72      Lymphocytes Absolute 1.38      Monocytes Absolute 0.43      Eosinophils Absolute 0.10      Basophils Absolute 0.01      Immature Granulocytes Absolute 0.01      NRBC Absolute 0.00     COMPREHENSIVE METABOLIC PANEL   TROPONIN I, HIGH SENSITIVITY       Glasgow Coma Scale Score: 15      ED Course & MDM   Diagnoses as of 02/09/23 1859   Chest pain, unspecified type       Medical Decision Making  Problems Addressed:  Chest pain, unspecified type: complicated acute illness or injury    Amount and/or Complexity of Data Reviewed  External Data Reviewed: labs, radiology, ECG and notes.  Labs: ordered. Decision-making details documented in ED Course.  Radiology: ordered and independent interpretation performed. Decision-making details documented in ED Course.  ECG/medicine tests: ordered and independent interpretation performed. Decision-making details documented in ED Course.    Risk  OTC drugs.  Decision regarding hospitalization.                           Unfortunately, is a 47 year old male presenting with chest pain.  Per chart review in care everywhere, he has had recurrent presentations with very similar symptoms, very similar complaints at multiple hospitals: Multiple ERs.  He has been admitted for evaluation of cardiac catheterization multiple times  but typically leaves AMA requesting opiates.  Unfortunately today, he also reported  at presentation they just been discharged from outside hospital which we do not have records for.  Will consider ACS, however it does appear that he does not have true coronary artery disease per the most recent catheterization report from 2020, his primary reason he had CABG was for anomalous coronary arteries and then had repair and grafting of that again in 2012.  He repeatedly has normal biomarkers.  He is I did review stress test as well from outside hospitals that note previous injury but suspect this is likely from his initial insults.  We will send delta troponin today, he does have risk factors including diabetes, likely elevated blood pressure, elevated BMI to be reassured he is not having acute ACS today.  Low suspicion with no neurodeficits, no pulse deficits, no neurocomplaints that this is associate with an aortic process.  Will send labs of it for any signs of anemia, uremia, acidosis or electrolyte abnormality.    EKG as interpreted independently by myself shows normal sinus rhythm, rate 65, normal axis, normal intervals, no ST elevation or depression, T waves unremarkable, the elevation is consistent  with early repolarization in V2 V3, less than 2 mm elevation which is within normal limits.  No reciprocal changes, similar compared to previous from January 20, 2023    Cardiac telemetry monitoring indicated  due to presentation for history of chest pain to evaluate for arrhythmia.  Cardiac monitor was ordered during ED course, the rate was approximately 60s in sinus rhythm    Chest x-ray as interpreted by myself does show mild cardiomegaly, no effusion, no pneumothorax, no vascular congestion, no focal consolidation.  Radiology report notes question right middle lobe airspace disease but patient does not have any significant cough, fevers or shortness of breath in comparison to previous, there is a similar finding  from January 19, 2023 and my interval evaluation is very reassured this is not representing a new pneumonia.    Overall patient will be stable for discharge, he prefers to be discharged prior to the repeat troponin resulting.  This troponin is 50 which is stable from a level he is had previously at the same exact number actually from the last month.  No signs of anemia, uremia, acidosis or electrolyte abnormality.  I did discuss with him that the normal blood work is reassuring and that given his young age, catheterization within several years without significant disease, multiple workups that it is appropriate that he can follow-up with his outpatient cardiologist for outpatient further restratification. Discharge precautions discussed.      He would like to be discharged AMA prior to his resulting troponin and I will call him if this is abnormal.    PLAN:  - Continue to stay well-hydrated drinking plenty fluids  - You can also add Tylenol 1000 mg often as every 6 hours.  Do not take more than 4000 mg of Tylenol in 1 day.  Tylenol is also known as acetaminophen or paracetamol.  - Follow-up with PCP for reassessment and progression of symptoms.  - Contact your outpatient cardiologist for further clinic reevaluation and further outpatient management and recommendations.  - Return to the emergency department if any worsening or concerning symptoms.     Above plan discussed with patient and wife at bedside and they are amenable to discharge plan and follow-up and all questions and concerns addressed.       Loel Lofty, MD  02/09/23 1900    7:26 PM  Patient has left the ER.  His repeat troponin is 55, delta troponin is 5, this is within normal limits for the high-sensitivity troponin 0 and 1 hr evaluation, and reassuring, and does not change active emergency medicine management.  Patient does not need to be contacted         Loel Lofty, MD  02/09/23 1927

## 2023-02-09 NOTE — ED Triage Notes (Signed)
Pt discharged from [facility] today after being admitted for CP. Reports sitting on bus and had left CP, that radiated to arm and a near syncopal when standing. Allergic to nitro

## 2023-02-09 NOTE — Other (Signed)
Patient Education  Table of Contents   Nonspecific Chest Pain, Adult    To view videos and all your education online visit,  [email or web address].elsevier.com/[email or web address]  or scan this QR code with your smartphone.  Access to this content will expire in one year.  Nonspecific Chest Pain, Adult  Chest pain is an uncomfortable, tight, or painful feeling in the chest. The pain can feel like a crushing, aching, or squeezing pressure. A person can feel a burning or tingling sensation. Chest pain can also be felt in your back, neck, jaw, shoulder, or arm. This pain can be worse when you move, sneeze, or take a deep breath.  Chest pain can be caused by a condition that is life-threatening. This must be treated right away. It can also be caused by something that is not life-threatening. If you have chest pain, it can be hard to know the difference, so it is important to get help right away to make sure that you do not have a serious condition.  Some life-threatening causes of chest pain include:   Heart attack.   A tear in the body's main blood vessel (aortic dissection).   Inflammation around your heart (pericarditis).   A problem in the lungs, such as a blood clot (pulmonary embolism) or a collapsed lung (pneumothorax).  Some non life-threatening causes of chest pain include:   Heartburn.   Anxiety or stress.   Damage to the bones, muscles, and cartilage that make up your chest wall.   Pneumonia or bronchitis.   Shingles infection (varicella-zoster virus).  Your chest pain may come and go. It may also be constant. Your health care provider will do tests and other studies to find the cause of your pain. Treatment will depend on the cause of your chest pain.  Follow these instructions at home:  Medicines   Take over-the-counter and prescription medicines only as told by your health care provider.   If you were prescribed an antibiotic medicine, take it as told by your health care provider. Do not stop taking the antibiotic even if you  start to feel better.  Activity   Avoid any activities that cause chest pain.   Do not lift anything that is heavier than 10 lb (4.5 kg), or the limit that you are told, until your health care provider says that it is safe.   Rest as directed by your health care provider.    Return to your normal activities only as told by your health care provider. Ask your health care provider what activities are safe for you.  Lifestyle       Do not use any products that contain nicotine or tobacco, such as cigarettes, e-cigarettes, and chewing tobacco. If you need help quitting, ask your health care provider.   Do not drink alcohol.   Make healthy lifestyle changes as recommended. These may include:  ? Getting regular exercise. Ask your health care provider to suggest some exercises that are safe for you.  ? Eating a heart-healthy diet. This includes plenty of fresh fruits and vegetables, whole grains, low-fat (lean) protein, and low-fat dairy products. A dietitian can help you find healthy eating options.  ? Maintaining a healthy weight.  ? Managing any other health conditions you may have, such as high blood pressure (hypertension) or diabetes.  ? Reducing stress, such as with yoga or relaxation techniques.  General instructions   Pay attention to any changes in your symptoms.   It is up to you to get  the results of any tests that were done. Ask your health care provider, or the department that is doing the tests, when your results will be ready.   Keep all follow-up visits as told by your health care provider. This is important.    You may be asked to go for further testing if your chest pain does not go away.  Contact a health care provider if:   Your chest pain does not go away.   You feel depressed.   You have a fever.   You notice changes in your symptoms or develop new symptoms.  Get help right away if:   Your chest pain gets worse.   You have a cough that gets worse, or you cough up blood.   You have severe pain in your  abdomen.   You faint.   You have sudden, unexplained chest discomfort.   You have sudden, unexplained discomfort in your arms, back, neck, or jaw.   You have shortness of breath at any time.   You suddenly start to sweat, or your skin gets clammy.   You feel nausea or you vomit.   You suddenly feel lightheaded or dizzy.   You have severe weakness, or unexplained weakness or fatigue.   Your heart begins to beat quickly, or it feels like it is skipping beats.  These symptoms may represent a serious problem that is an emergency. Do not wait to see if the symptoms will go away. Get medical help right away. Call your local emergency services (911 in the [place] Do not drive yourself to the hospital.  Summary   Chest pain can be caused by a condition that is serious and requires urgent treatment. It may also be caused by something that is not life-threatening.   Your health care provider may do lab tests and other studies to find the cause of your pain.   Follow your health care provider's instructions on taking medicines, making lifestyle changes, and getting emergency treatment if symptoms become worse.   Keep all follow-up visits as told by your health care provider. This includes visits for any further testing if your chest pain does not go away.  This information is not intended to replace advice given to you by your health care provider. Make sure you discuss any questions you have with your health care provider.  Document Released: 2005-02-14 Document Updated: 2022-03-22 Document Reviewed: 2022-03-22  Elsevier Patient Education ? 2024 [employer]

## 2023-02-09 NOTE — Discharge Instructions (Signed)
PLAN:  - Continue to stay well-hydrated drinking plenty fluids  - You can also add Tylenol 1000 mg often as every 6 hours.  Do not take more than 4000 mg of Tylenol in 1 day.  Tylenol is also known as acetaminophen or paracetamol.  - Follow-up with PCP for reassessment and progression of symptoms.  - Contact your outpatient cardiologist for further clinic reevaluation and further outpatient management and recommendations.  - Return to the emergency department if any worsening or concerning symptoms.

## 2023-02-11 LAB — BMP (EXT)
Anion Gap (EXT): 7 (ref 3–13)
Anion Gap (EXT): 7 (ref 3–13)
BUN (EXT): 13 mg/dL (ref 6–24)
BUN (EXT): 13 mg/dL (ref 6–24)
BUN/CREAT Ratio (EXT): 17
BUN/CREAT Ratio (EXT): 16
CO2 (EXT): 28 meq/L (ref 20–29)
CO2 (EXT): 28 meq/L (ref 20–29)
CalciumCalcium (EXT): 9.6 mg/dL (ref 8.4–10.2)
CalciumCalcium (EXT): 9.1 mg/dL (ref 8.4–10.2)
Chloride (EXT): 105 meq/L (ref 98–110)
Chloride (EXT): 105 meq/L (ref 98–110)
Creatinine (EXT): 0.78 mg/dL (ref 0.64–1.27)
Creatinine (EXT): 0.8 mg/dL (ref 0.64–1.27)
Glucose (EXT): 148 mg/dL — ABNORMAL HIGH (ref 67–99)
Glucose (EXT): 227 mg/dL — ABNORMAL HIGH (ref 67–99)
Potassium (EXT): 3.7 meq/L (ref 3.6–5.1)
Potassium (EXT): 3.7 meq/L (ref 3.6–5.1)
Sodium (EXT): 140 meq/L (ref 135–145)
Sodium (EXT): 140 meq/L (ref 135–145)
eGFR - Creat CKD-EPI (EXT): 112 mL/min/{1.73_m2} (ref >90)
eGFR - Creat CKD-EPI (EXT): 111 mL/min/{1.73_m2} (ref >90)

## 2023-04-05 LAB — BMP (EXT)
Anion Gap (EXT): 14 mmol/L (ref 3–17)
BUN (EXT): 7 mg/dL — ABNORMAL LOW (ref 8–25)
CO2 (EXT): 23 mmol/L (ref 23–32)
CalciumCalcium (EXT): 9.4 mg/dL (ref 8.5–10.5)
Chloride (EXT): 102 mmol/L (ref 98–108)
Creatinine (EXT): 0.71 mg/dL (ref 0.60–1.30)
GFR Estimated (Calc) (EXT): 115 mL/min/{1.73_m2} (ref >59)
Glucose (EXT): 187 mg/dL — ABNORMAL HIGH (ref 70–110)
Potassium (EXT): 3.9 mmol/L (ref 3.4–5.0)
Sodium (EXT): 139 mmol/L (ref 135–145)

## 2023-04-06 LAB — PROTHROMBIN TIME CARE EVERYWHERE
INR CARE EVERYWHERE: 1.1 (ref 0.9–1.1)
PROTHROMBIN TIME CARE EVERYWHERE: 12.9 s — ABNORMAL HIGH (ref 10.4–12.8)

## 2023-04-06 LAB — APTT CARE EVERYWHERE: APTT CARE EVERYWHERE: 27 s (ref 26–36)

## 2023-04-07 LAB — UNMAPPED LAB RESULTS
Phosphorous (EXT): 3.3 mg/dL (ref 2.7–4.5)
Phosphorous (EXT): 4.3 mg/dL (ref 2.7–4.5)

## 2023-04-07 LAB — HEMOGLOBIN A1C CARE EVERYWHERE
ESTIMATED AVERAGE GLUCOSE CARE EVERYWHERE: 189 mg/dL
HEMOGLOBIN A1C CARE EVERYWHERE: 8.2 % — ABNORMAL HIGH (ref 4.8–6.0)

## 2023-04-10 LAB — HEMOGLOBIN A1C CARE EVERYWHERE: HEMOGLOBIN A1C CARE EVERYWHERE: 7.7 % — ABNORMAL HIGH (ref 4.3–5.6)

## 2023-04-10 LAB — TYPE AND SCREEN CARE EVERYWHERE
ABO/RH CARE EVERYWHERE: O POS
ANTIBODY SCREEN CARE EVERYWHERE: NEGATIVE

## 2023-04-10 LAB — PROTHROMBIN TIME CARE EVERYWHERE
INR CARE EVERYWHERE: 1.2 (ref 0.8–1.2)
PROTHROMBIN TIME CARE EVERYWHERE: 13.8 s — ABNORMAL HIGH (ref 10.0–13.0)

## 2023-04-10 LAB — APTT CARE EVERYWHERE: APTT CARE EVERYWHERE: 33 s (ref 24.0–37.0)

## 2023-04-16 LAB — CMP (EXT)
A/G Ratio (EXT): 1.3
ALT/SGPT (EXT): 58 U/L — ABNORMAL HIGH (ref 7–40)
AST/SGOT (EXT): 42 U/L — ABNORMAL HIGH (ref 13–40)
Albumin (EXT): 4.8 g/dL (ref 3.2–4.8)
Alkaline Phosphatase (EXT): 60 U/L (ref 46–116)
Anion Gap (EXT): 6 meq/L (ref 2–12)
BUN (EXT): 10 mg/dL (ref 9–23)
Bilirubin, Total (EXT): 0.3 mg/dL (ref 0.3–1.2)
CO2 (EXT): 24 meq/L (ref 20.0–31.0)
CalciumCalcium (EXT): 9.5 mg/dL (ref 8.3–10.6)
Chloride (EXT): 109 meq/L (ref 99–113)
Creatinine (EXT): 0.92 mg/dL (ref 0.70–1.30)
GFR Estimated (Calc)GFR Estimated (Calc) (EXT): 105 (ref >60)
Globulin (EXT): 3.8 g/dL — ABNORMAL HIGH (ref 1.6–3.5)
Glucose (EXT): 162 mg/dL — ABNORMAL HIGH (ref 70–99)
Potassium (EXT): 3.5 meq/L (ref 3.5–5.5)
Protein (EXT): 8.6 g/dL — ABNORMAL HIGH (ref 5.7–8.2)
Sodium (EXT): 139 meq/L (ref 135–145)

## 2023-04-17 NOTE — Consults (Signed)
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  Patient reports that he has both chronic and acute chest pain, was on the  train yesterday when he suddenly woke up from sleep with abrupt crushing  substernal chest pain.  He subsequently got off the train, called an ambulance  and came to the emergency room.  He states that his chest pain felt just like  when he had his first CABG for anomalous RCA.  Although his character does  sound typical, the duration of symptoms for multiple days and persisting at  the time of my evaluation with a 10 out of 10 severity despite the fact that  he is calmly sitting in bed relaying the story to me, with no other signs of  distress strongly argues against ongoing ischemia.  He otherwise denies  shortness of breath, orthopnea, PND, lower extremity edema, dizziness,  lightheadedness, neurologic deficits, or other acute complaints.  REVIEW OF SYSTEMS:      CONST: No fever, fatigue, or weight changes.      EYES: No recent vision problems.      ENT: No congestion, ear pain, or sore throat.      C/V: No chest pain, palpitations, or edema.      RESP: No cough, congestion, wheezing, or shortness of breath.      GI: No abdominal pain, nausea, vomiting, constipation, or diarrhea.      GU: No incontinence or dysuria.      M/S: No joint pain or swelling.      SKIN: No rash.      NEURO: No headache, focal numbness or weakness, dizziness, or seizures.      PSYCH: No depression or anxiety.      ENDO: No thyroid problems. No polyuria or polydipsia.      HEME: No abnormal bruising or bleeding.  PHYSICAL EXAM:   VITAL SIGNS:        Vital Signs:         Last Charted:        24 Hr Minimum:        24 Hr Maximum:        Temperature Oral        36.5        (11/27 09:30)        36.5        (11/27 09:30)        36.7        (11/27 04:43)        Heart Rate        62        (11/27 09:30)        54 (L)        (11/27 07:31)        79        (11/26 23:38)        Cardiac Rhythm        Normal sin        (11/27 09:00)          Respiratory Rate        20        (  11/27 09:30)        14        (11/26 23:38)        23 (H)        (11/26 23:43)        Systolic BP        156 (H)        (11/27 09:30)        124        (11/27 02:13)        201 (H)        (11/26 23:38)        Diastolic BP        85        (10/27 09:30)        77        (11/27 04:43)        120 (H)        (11/27 00:20)        Mean Arterial Pressure        107        (11/27 07:31)        94        (11/27 04:43)        133        (11/26 23:38)        SpO2/Pulse Oximetry        98        (11/27 09:30)        95        (11/27 04:43)        98        (11/26 23:43)        Dosing BMI        32        (11/27 09:02)        32        (11/26 23:30)        32        (11/26 23:30)      GENERAL: No acute distress, non-toxic appearing.      HEAD: Normal with no signs of head trauma.      EYES: PERRLA, EOMI, conjunctiva normal.      NECK: No JVD.      LUNGS: Clear breath sounds bilaterally.  No wheezes, rales, or rhonchi.      HEART: Regular rate and rhythm.  Normal S1 and S2, without murmurs, rub,  or gallop.      VASC: No edema. Peripheral pulses normal and equal in all extremities.      ABD: Bowel sounds normal, soft, nontender.      EXT: Normal range of motion, no joint swelling, no clubbing, no cyanosis.      SKIN: No rashes or lesions.      NEURO: Alert and oriented x 3. Normal affect. No focal sensory or strength  deficits.      Transthoracic echocardiogram 11/2022      Normal left ventricular size, thickness and systolic function with no      regional wall motion abnormalities. The left ventricular ejection      fraction is  60-65%.        Right Ventricle      Normal right ventricular size and function.        Atria      The left atrial size is normal. Right atrial size is normal.        Mitral Valve      Normal mitral valve.  There is mild to moderate mitral regurgitation.        Tricuspid Valve      The tricuspid valve is normal in structure and function. There is      trace tricuspid regurgitation. Pulmonary Artery Systolic Pressure      could not be estimated because of incomplete tricuspid regurgitation      Doppler jet envelope.        Aortic Valve      Trileaflet aortic valve with focally thickened leaflets. There is no      aortic stenosis. No aortic regurgitation is present.        Pulmonic Valve      The pulmonic valve is normal in structure and function.        Great Vessels      The aortic root is normal size. The ascending aorta is normal in size.      The Inferior Vena Cava is normal suggesting a normal right atrial      pressure of 5 mm Hg.        Pericardium/Pleural      There is no pericardial effusion.    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  Patient does have a significant cardiac history, but as of most recent  evaluation by [place] in July his EF was still normal, has had multiple ACS rule  outs that have been negative, and his ECG has no significant changes.  Unfortunately [name] has extensive drug-seeking behavior, just this  month was at [facility] on the [facility], [facility] on [facility], and [address] on  the 25th, presenting with the same complaints and leaving AMA when not given  opiates or if further evaluation was recommended.  His blood pressure was  suggestive of hypertensive urgency at the time of arrival, so I think it is  prudent to start losartan 25 mg daily especially in the setting of an A1c  of  8.2.  I have offered to see him as an outpatient in 6 to 8 weeks to reevaluate  his chronic chest pain.  I do not believe this represents active ischemia.      Plan      -Start losartan 25 mg daily      -Continue aspirin 81 mg      -Continue atorvastatin 80 mg daily      -Continue metoprolol 25 mg twice daily      Follow-up with the heart center in 6 to 8 weeks if the patient is amenable  ATTESTATION:   I spent 55 minutes today on direct patient care for this date of service  including primary review of all relevant clinical data, history and physical  examination, shared patient decision making, care coordination and  documentation.  PROBLEM LIST/PAST MEDICAL HISTORY:   Ongoing      CAD (coronary artery disease) (Medical)      T2DM (type 2 diabetes mellitus) (Medical)   Historical      No qualifying data    PROCEDURE/SURGICAL HISTORY:     CABG x 3 - Coronary artery bypass grafts x 3  SOCIAL HISTORY:     Alcohol      Use: Denies use., 02/10/2023     Home/Environment      Human  Trafficking Red Flags None., 02/10/2023      Human Trafficking Red Flags None., 01/11/2022     Smoking/Tobacco Use      Smoking tobacco use: Former smoker., 02/10/2023      Smoking tobacco use: Current some day smoker., 01/11/2022     Substance Abuse      Use: Denies use., 02/10/2023      Use: Denies use., 01/11/2022  MEDICATIONS:   Medications (8) Active   Scheduled: (4)   aspirin  81 mg = 1 tab, Oral, Daily   atorvastatin  80 mg = 1 tab, Oral, Q24H-int   ethanol topical (Alcohol-Based Nasal Antiseptic Swab)  1 application, Each  Nostril, BID   metoprolol (Toprol-XL)  25 mg = 1 tab, Oral, BID     Continuous: (0)     PRN: (4)   glucagon  1 mg, IntraMuscular, As Directed   glucose (Dextrose 50% syringe)  12.5 g = 25 mL, IV Push, As Directed   glucose (glucose 40% oral gel)  15 g = 37.5 mL, Oral, As Directed   ondansetron (Zofran)  4 mg = 2 mL, IV Push, Q6hr    ALLERGIES:       Allergies (5) Active       Reaction       codeine       hives        Motrin       hives       nitroglycerin       hives       Toradol       hives       traMADol       hives  LAB RESULTS:       Hematology Basic - Last 36 hours (19)       Result       Date/Time       WBC       7.2        11/27 00:01       RBC       4.22 (L)        11/27 00:01       Hgb       13.0 (L)        11/27 00:01       Hct       38.4 (L)        11/27 00:01       MCV       91.0        11/27 00:01       MCH       30.8        11/27 00:01       MCHC       33.9        11/27 00:01       RDW       13.4        11/27 00:01       Platelet Count       293        11/27 00:01       Neutrophil Rel       51.4        11/27 00:01       Lymphocyte Rel       36.6        11/27 00:01       Monocyte Rel       7.1        11/27 00:01  Eosinophil Rel       3.0        11/27 00:01       Basophil Rel       1.9        11/27 00:01       Neutrophil Abs       3.70        11/27 00:01       Lymphocyte Abs       2.60        11/27 00:01       Monocyte Abs       0.50        11/27 00:01       Eosinophil Abs       0.20        11/27 00:01       Basophil Abs       0.10        11/27 00:01         Chemistry Comprehensive - Last 36 hours (19)       Result       Date/time       Sodium Lvl       140        11/27 00:01       Potassium Lvl       3.8        11/27 00:01       Chloride Lvl       106        11/27 00:01       CO2       26        11/27 00:01       Glucose Level       131 (H)        11/27 00:01       BUN       9        11/27 00:01       Creatinine Lvl       0.86        11/27 00:01       AGAP       8 (L)        11/27 00:01       Total Protein       7.6        11/27 00:01       Albumin Lvl       4.0        11/27 00:01       Calcium Lvl       9.3        11/27 00:01       Alk Phos       47        11/27 00:01       AST       28        11/27 00:01       ALT       44        11/27 00:01       Hgb A1c       8.2 (H)        11/27 05:13       est Av Glu(eAG)       189 (H)        11/27 05:13       Magnesium Lvl       1.8 (L)        11/27 05:13  Bili  Total       0.4        11/27 00:01       TSH       1.816        11/27 00:01    RADIOLOGY/DIAGNOSTIC RESULTS:   Radiology - Last 36 hours (1)   XR Chest 1 View Portable (11/27 00:07)   Findings:   No consolidation or effusion.   Postsurgical changes in the mediastinum and right suprahilar region.   Cardiomediastinal silhouette is stable.   No acute fracture.     IMPRESSION:   1. No acute findings.     This document has been     Diagnostics - Non-Radiology (0)   No qualifying data.  Electronically Signed On 04/17/23 10:26 EST  _____________________________________________   Fenton Malling MD, Gina Costilla    Patient account number:  [id number]  Ordering physician:  ,    Other providers:  BRENDA Dyann Kief, BRENDA NYAMOGO,    No PCP,  ,  ,

## 2023-04-27 ENCOUNTER — Emergency Department: Admit: 2023-04-28 | Payer: MEDICAID

## 2023-04-27 ENCOUNTER — Inpatient Hospital Stay
Admission: EM | Admit: 2023-04-27 | Discharge: 2023-04-28 | Disposition: A | Discharge status: Home / Self Care | Payer: MEDICAID | Admitting: Internal Medicine

## 2023-04-27 DIAGNOSIS — R079 Chest pain, unspecified: Secondary | ICD-10-CM

## 2023-04-27 DIAGNOSIS — R0789 Other chest pain: Secondary | ICD-10-CM

## 2023-04-27 MED ORDER — HYDROmorphone (Dilaudid) injection 0.5 mg
1 | Freq: Once | INTRAMUSCULAR | Status: AC
Start: 2023-04-27 — End: 2023-04-27
  Administered 2023-04-28: 02:00:00 0.5 mg via INTRAVENOUS

## 2023-04-27 MED ORDER — acetaminophen (Tylenol) tablet 975 mg
325 | Freq: Once | ORAL | Status: AC
Start: 2023-04-27 — End: 2023-04-27
  Administered 2023-04-28: 04:00:00 975 mg via ORAL

## 2023-04-27 MED ORDER — HYDROmorphone (Dilaudid) injection 0.5 mg
1 | Freq: Once | INTRAMUSCULAR | Status: AC
Start: 2023-04-27 — End: 2023-04-27
  Administered 2023-04-28: 04:00:00 0.5 mg via INTRAVENOUS

## 2023-04-27 NOTE — ED Provider Notes (Signed)
EMERGENCY DEPARTMENT ENCOUNTER    ATTENDING NOTE    Date of service: 04/27/2023  6:48 PM    Chief Complaint   Patient presents with    Chest Pain     CC: Chest pain  Nursing notes reviewed and agreed with.    HISTORY OF PRESENT ILLNESS:    [name] is a 47 y.o. adult with pmhx of CAD (2 previous MIs) who presents with chest pain. 1 day, radiates left arm and neck.     Hx of similar issues in past few months    Allergic to nitro, took 4 baby aspirin.     Crushing pain; no ripping or tearing or back pain. No dizziness or vision changes.           ROS  In addition to that documented in the HPI above, the additional ROS was obtained:  Constitutional: Denies fevers or chills  Eyes: Denies vision changes  ENMT: Denies sore throat  CV:  +chest pain  Resp: Denies SOB  GI: Denies vomiting or diarrhea  GU: Denies painful urination  MSK: Denies recent trauma  Skin: Denies new rashes  Neuro: Denies syncope  Endocrine: Denies unexpected weight loss  Heme: Denies bleeding disorders    PAST MEDICAL HISTORY / PROBLEM LIST    Past Medical History:   Diagnosis Date    Coronary artery disease (CMS-HCC)     Diabetes mellitus (Multi-HCC)     Heart attack (Multi-HCC)     x3    Hypertension (CMS-HCC)     pt denies having high BP       Patient Active Problem List   Diagnosis    Chest pain    Hypokalemia    Chest pain, unspecified type    Type 2 diabetes mellitus (Multi-HCC)    Tobacco abuse    Obesity    Presence of aortocoronary bypass graft    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)    HLD (hyperlipidemia) (CMS-HCC)    Essential hypertension (CMS-HCC)    Congenital anomaly of artery (HHS-HCC)       PAST SURGICAL HISTORY    Past Surgical History:   Procedure Laterality Date    ARTERIAL BYPASS SURGERY      x2    CHOLECYSTECTOMY      CORONARY ARTERY BYPASS GRAFT      CTA ABDOMEN PELVIS W AND WO CONTRAST  12/03/2022    CTA ABDOMEN PELVIS W AND WO CONTRAST 12/03/2022       MEDICATIONS     Discharge Medication List as of  04/28/2023  1:25 PM        CONTINUE these medications which have NOT CHANGED    Details   aspirin 81 mg capsule Take 81 mg by mouth in the morning., Starting Sun 01/05/2018, Historical Med      atorvastatin (Lipitor) 10 mg tablet Take 40 mg by mouth in the morning., Starting Sun 01/05/2018, Historical Med      CLINDAMYCIN HCL ORAL Take by mouth., Historical Med      insulin glargine (Lantus) 100 unit/mL (3 mL) injection Inject 15 Units under the skin at bedtime., Starting Thu 12/07/2021, Historical Med      metoprolol succinate XL (Toprol-XL) 50 mg 24 hr tablet Take 25 mg by mouth twice daily., Starting Thu 12/07/2021, Historical Med      metoprolol tartrate (Lopressor) 25 mg tablet Take 25 mg by mouth in the morning and 25 mg in the evening., Starting Mon 03/11/2017, Historical Med  Ventolin HFA 90 mcg/actuation inhaler Inhale 2 puffs every 6 (six) hours if needed., Starting Thu 12/07/2021, Historical Med              ALLERGIES    Allergies   Allergen Reactions    Ibuprofen Anaphylaxis, Hives, Itching and Rash     Other reaction(s): Anaphylactic reaction, Unknown, Urticaria  anaphylaxis  anaphylaxis      Ketorolac Anaphylaxis, Hives and Rash     Other reaction(s): ITCHY HIVES, Unknown, Urticaria  Converted from Generic Allergy: Ketorolac  Anaphylaxis   Anaphylaxis   Tolerates ASA      Nitroglycerin      hives    Tramadol Anaphylaxis, Angioedema, Hives and Rash     Other reaction(s): THROAT CLOSES, Unknown, Urticaria  Patient confirmed that he can take Percocet, Hydromorphone and Morphine. [name], Pharmacy  anaphylaxis  anaphylaxis      Codeine        SOCIAL HISTORY    Social History     Tobacco Use    Smoking status: Every Day     Current packs/day: 0.50     Types: Cigarettes     Passive exposure: Current    Smokeless tobacco: Never   Substance Use Topics    Alcohol use: Never     Social History     Substance and Sexual Activity   Drug Use Never       FAMILY HISTORY    Family History   Problem Relation Name  Age of Onset    Heart disease Father       Family hx reviewed.     There were no vitals filed for this visit.     Physical Exam  I have reviewed the triage vital signs  Const: Well nourished, speaking full sentences  Eyes: PERRL, no conjunctival injection  HENT: NCAT, Neck supple without meningismus   Oropharynx: Wnl  CV: Regular rate, normal rhythm, no murmur, no leg swelling;  Well healed midline surgical scar    No calf ttp  Warm, well-perfused extremities  RESP: CTAB, Unlabored respiratory effort  GI: soft, non-tender, non-distended, normal BS  MSK: No gross deformities appreciated. No calf tenderness bilaterally;   Skin: Warm, dry. No rashes  Neuro: Alert, CNs II-XII grossly intact. Sensation and motor function of extremities grossly intact.  Psych: Appropriate mood and affect.    LABS    Labs Reviewed   COMPREHENSIVE METABOLIC PANEL - Abnormal       Result Value    Sodium 140      Potassium 3.9      Chloride 103      CO2 (Bicarbonate) 26      Anion Gap 11      BUN 11      Creatinine 0.80      eGFRcr 111      Glucose 118      Fasting? Unknown      Calcium 9.6      AST 47 (*)     ALT 58 (*)     Alkaline phosphatase 59      Protein, total 7.6      Albumin 4.0      Bilirubin, total 0.3     TROPONIN T, HIGH SENSITIVITY - Abnormal    Troponin T, High Sensitivity 16 (*)    TROPONIN T, HIGH SENSITIVITY - Abnormal    Troponin T, High Sensitivity 14 (*)    PTT - Abnormal    aPTT 90.6 (*)  CBC WITH DIFFERENTIAL - Abnormal    WBC 3.5 (*)     RBC 4.49      Hemoglobin 13.5      Hematocrit 40.9      MCV 91.1      MCH 30.1      MCHC 33.0      RDW-CV 12.1      RDW-SD 40.7      Platelets 224      MPV 10.1      Neutrophil % 39.6      Lymphocyte % 47.3      Monocytes % 8.5      Eosinophils % 3.7      Basophils % 0.6      Immature Granulocytes % 0.3      NRBC % 0.0      Neutrophils Absolute 1.39 (*)     Lymphocytes Absolute 1.66      Monocytes Absolute 0.30 (*)     Eosinophils Absolute 0.13      Basophils Absolute 0.02       Immature Granulocytes Absolute 0.01      NRBC Absolute 0.00     BASIC METABOLIC PANEL - Abnormal    Sodium 139      Potassium 4.0      Chloride 104      CO2 (Bicarbonate) 25      Anion Gap 10      BUN 9      Creatinine 0.70      eGFRcr 116      Glucose 154 (*)     Fasting? Unknown      Calcium 9.2     TROPONIN T, HIGH SENSITIVITY - Abnormal    Troponin T, High Sensitivity 12 (*)    RBC MORPHOLOGY - Abnormal    RBC Morphology See Indices (*)    POCT GLUCOSE - Abnormal    POCT Glucose 182 (*)    MANUAL DIFFERENTIAL - SYSMEX WAM - Abnormal    Neutrophils % 37      Lymphocytes % 49      Monocytes % 6      Eosinophils % 4      Basophils % 0      Reactive Lymphocytes % 4      Neutrophils Absolute 1.61      Lymphocytes Absolute 2.14      Monocytes Absolute 0.26 (*)     Eosinophils Absolute 0.17      Basophils Absolute 0.00      Reactive Lymphs Absolute 0.17 (*)     Total WBC Counted 115      RBC Morphology See Indices (*)    PROTIME-INR - Normal    Protime 11.2      INR 0.97     PTT - Normal    aPTT 29.3     CBC WITH DIFFERENTIAL - Normal    WBC 4.4      RBC 4.51      Hemoglobin 13.5      Hematocrit 40.8      MCV 90.5      MCH 29.9      MCHC 33.1      RDW-CV 12.2      RDW-SD 40.7      Platelets 236      MPV 10.2      NRBC % 0.0      NRBC Absolute 0.00     LIPASE - Normal    Lipase 21  PROTIME-INR - Normal    Protime 11.3      INR 0.98     MAGNESIUM - Normal    Magnesium 1.8     PHOSPHORUS - Normal    Phosphorus 2.5     CBC W/DIFF    Narrative:     The following orders were created for panel order CBC and differential.  Procedure                               Abnormality         Status                     ---------                               -----------         ------                     CBC w/ Differential[[id number]]          Normal              Final result               Manual Differential (LAB.Marland KitchenMarland Kitchen[[id number]]  Abnormal            Final result                 Please view results for these tests on the individual  orders.   ANTI-XA, HEPARIN    Anti-Xa Heparin 0.45      HEPARIN TYPE Unfractionated Heparin      Narrative:     The therapeutic range for treatment of deep venous thrombosis with low molecular weight heparin using twice daily dosing measured by the Anti-Xa method is 0.5 to 1.0 IU/mL.    The therapeutic range for continuous IV infusion of unfractionated heparin measured by the Anti-Xa method is 0.2 to 0.6 IU/mL for the low dose protocol and 0.3 to 0.7 IU/mL for the standard dose protocol.   CBC W/DIFF    Narrative:     The following orders were created for panel order CBC and differential.  Procedure                               Abnormality         Status                     ---------                               -----------         ------                     CBC w/ Differential[[id number]]          Abnormal            Final result                 Please view results for these tests on the individual orders.   POCT GLUCOSE METER        RADIOLOGY    XR CHEST 2 VIEWS   Final Result   No acute pulmonary abnormality.  DICTATED: Cline Crock   [facility] WITH DEPARTMENT POLICY, TEACHING PHYSICIANS REVIEW ALL IMAGES, AND EDIT REPORTS AS REQUIRED. A REPORT IS NOT FINAL UNTIL APPROVED BY A STAFF RADIOLOGIST.      APPROVED BY STAFF RADIOLOGIST: Fritz Pickerel 04/28/2023 4:45 AM EST          X-rays contemporaneously visualized and interpreted by me: No acute cardiopulmonary process     EKG    EKG visualized and interpreted by me and shows: No STEMI, similar to priors    Cardiac Monitor    Rhythm strip interpreted by me and shows: NSR    LABS    Labs Reviewed   COMPREHENSIVE METABOLIC PANEL - Abnormal       Result Value    Sodium 140      Potassium 3.9      Chloride 103      CO2 (Bicarbonate) 26      Anion Gap 11      BUN 11      Creatinine 0.80      eGFRcr 111      Glucose 118      Fasting? Unknown      Calcium 9.6      AST 47 (*)     ALT 58 (*)     Alkaline phosphatase 59      Protein, total 7.6      Albumin 4.0       Bilirubin, total 0.3     TROPONIN T, HIGH SENSITIVITY - Abnormal    Troponin T, High Sensitivity 16 (*)    TROPONIN T, HIGH SENSITIVITY - Abnormal    Troponin T, High Sensitivity 14 (*)    PTT - Abnormal    aPTT 90.6 (*)    CBC WITH DIFFERENTIAL - Abnormal    WBC 3.5 (*)     RBC 4.49      Hemoglobin 13.5      Hematocrit 40.9      MCV 91.1      MCH 30.1      MCHC 33.0      RDW-CV 12.1      RDW-SD 40.7      Platelets 224      MPV 10.1      Neutrophil % 39.6      Lymphocyte % 47.3      Monocytes % 8.5      Eosinophils % 3.7      Basophils % 0.6      Immature Granulocytes % 0.3      NRBC % 0.0      Neutrophils Absolute 1.39 (*)     Lymphocytes Absolute 1.66      Monocytes Absolute 0.30 (*)     Eosinophils Absolute 0.13      Basophils Absolute 0.02      Immature Granulocytes Absolute 0.01      NRBC Absolute 0.00     BASIC METABOLIC PANEL - Abnormal    Sodium 139      Potassium 4.0      Chloride 104      CO2 (Bicarbonate) 25      Anion Gap 10      BUN 9      Creatinine 0.70      eGFRcr 116      Glucose 154 (*)     Fasting? Unknown      Calcium 9.2     TROPONIN T, HIGH SENSITIVITY - Abnormal    Troponin T, High Sensitivity 12 (*)  RBC MORPHOLOGY - Abnormal    RBC Morphology See Indices (*)    POCT GLUCOSE - Abnormal    POCT Glucose 182 (*)    MANUAL DIFFERENTIAL - [facility] WAM - Abnormal    Neutrophils % 37      Lymphocytes % 49      Monocytes % 6      Eosinophils % 4      Basophils % 0      Reactive Lymphocytes % 4      Neutrophils Absolute 1.61      Lymphocytes Absolute 2.14      Monocytes Absolute 0.26 (*)     Eosinophils Absolute 0.17      Basophils Absolute 0.00      Reactive Lymphs Absolute 0.17 (*)     Total WBC Counted 115      RBC Morphology See Indices (*)    PROTIME-INR - Normal    Protime 11.2      INR 0.97     PTT - Normal    aPTT 29.3     CBC WITH DIFFERENTIAL - Normal    WBC 4.4      RBC 4.51      Hemoglobin 13.5      Hematocrit 40.8      MCV 90.5      MCH 29.9      MCHC 33.1      RDW-CV 12.2      RDW-SD  40.7      Platelets 236      MPV 10.2      NRBC % 0.0      NRBC Absolute 0.00     LIPASE - Normal    Lipase 21     PROTIME-INR - Normal    Protime 11.3      INR 0.98     MAGNESIUM - Normal    Magnesium 1.8     PHOSPHORUS - Normal    Phosphorus 2.5     CBC W/DIFF    Narrative:     The following orders were created for panel order CBC and differential.  Procedure                               Abnormality         Status                     ---------                               -----------         ------                     CBC w/ Differential[[id number]]          Normal              Final result               Manual Differential (LAB.Marland KitchenMarland Kitchen[[id number]]  Abnormal            Final result                 Please view results for these tests on the individual orders.   ANTI-XA, HEPARIN    Anti-Xa Heparin 0.45      HEPARIN TYPE Unfractionated Heparin      Narrative:  The therapeutic range for treatment of deep venous thrombosis with low molecular weight heparin using twice daily dosing measured by the Anti-Xa method is 0.5 to 1.0 IU/mL.    The therapeutic range for continuous IV infusion of unfractionated heparin measured by the Anti-Xa method is 0.2 to 0.6 IU/mL for the low dose protocol and 0.3 to 0.7 IU/mL for the standard dose protocol.   CBC W/DIFF    Narrative:     The following orders were created for panel order CBC and differential.  Procedure                               Abnormality         Status                     ---------                               -----------         ------                     CBC w/ Differential[[id number]]          Abnormal            Final result                 Please view results for these tests on the individual orders.   POCT GLUCOSE METER        Procedures    Procedures      MEDICAL DECISION MAKING & ED COURSE    All labs, records, and imaging were reviewed and interpreted by me.     [name] is a 47 y.o. adult with pmhx of extensive cardiovaascular risk factors who  presents with chest pain. On arrival pt well appearing, HDS, afebrile and with active chest pain but mild. EKG reviewed without evidence of ischemia. Exam pt appears euvolemic, equal pulses. Heart score of 6. Do not suspect PE (no pleuritic or sob, no recent risk factors, stable vitals, no leg pain). No clinical c/f AD, no back pain, no widened mediastinum, equal pulses and BPs. Will cycle troponin, CXR, metabolic workup, and observe on monitor. Admit for repeat stress vs cath given last stress nondiagnostic and high risk.     Signed out pending repeat trop and admission/cards consultation.         Pts treatment course and plan and strict return precautions discussed in detail and pt expressed understanding and agreeable with above plan. I have reviewed safe use of over the counter medications and the side effects of medications administered during today's visit or prescribed.     Diagnoses as of 04/29/23 0828   Chest pain, unspecified type           EXTERNAL RECORDS REVIEWED  I have review patients past medical records in our system and any relevant OSH records--     TESTS/TREATMENT/HOSPITALIZATION CONSIDERED      CHRONIC CONDITIONS      DISCUSSION WITH OTHER PROVIDERS  Nursing  Cards  Medicine resident    SOCIAL DETERMINATES OF HEALTH      CLINICAL IMPRESSION:    1. Chest pain, unspecified type             Curlene Labrum, MD  04/29/23 [phone]

## 2023-04-27 NOTE — ED Triage Notes (Signed)
Pt a/ox3. Arrives via [facility]. Pt was ambulating and developed sudden onset of mid sternal chest pain, non-radiating. Given 4 baby aspirin. Hx of CAD and MI in 2018. Pt with multiple presentations for same to outside hospitals.

## 2023-04-28 LAB — CBC WITH DIFFERENTIAL
Basophils %: 0.6 %
Basophils Absolute: 0.02 10*3/uL (ref 0.00–0.22)
Eosinophils %: 3.7 %
Eosinophils Absolute: 0.13 10*3/uL (ref 0.00–0.50)
Hematocrit: 40.8 % (ref 32.0–53.0)
Hematocrit: 40.9 % (ref 32.0–53.0)
Hemoglobin: 13.5 g/dL (ref 13.0–17.5)
Hemoglobin: 13.5 g/dL (ref 13.0–17.5)
Immature Granulocytes %: 0.3 %
Immature Granulocytes Absolute: 0.01 10*3/uL (ref 0.00–0.10)
Lymphocyte %: 47.3 %
Lymphocytes Absolute: 1.66 10*3/uL (ref 0.70–4.00)
MCH: 29.9 pg (ref 26.0–34.0)
MCH: 30.1 pg (ref 26.0–34.0)
MCHC: 33.1 g/dL (ref 31.0–37.0)
MCHC: 33 g/dL (ref 31.0–37.0)
MCV: 90.5 fL (ref 80.0–100.0)
MCV: 91.1 fL (ref 80.0–100.0)
MPV: 10.2 fL (ref 9.1–12.4)
MPV: 10.1 fL (ref 9.1–12.4)
Monocytes %: 8.5 %
Monocytes Absolute: 0.3 10*3/uL — ABNORMAL LOW (ref 0.36–0.83)
NRBC %: 0 % (ref 0.0–0.0)
NRBC %: 0 % (ref 0.0–0.0)
NRBC Absolute: 0 10*3/uL (ref 0.00–2.00)
NRBC Absolute: 0 10*3/uL (ref 0.00–2.00)
Neutrophil %: 39.6 %
Neutrophils Absolute: 1.39 10*3/uL — ABNORMAL LOW (ref 1.50–7.95)
Platelets: 236 10*3/uL (ref 150–400)
Platelets: 224 10*3/uL (ref 150–400)
RBC: 4.51 M/uL (ref 4.20–5.90)
RBC: 4.49 M/uL (ref 4.20–5.90)
RDW-CV: 12.2 % (ref 11.5–14.5)
RDW-CV: 12.1 % (ref 11.5–14.5)
RDW-SD: 40.7 fL (ref 35.0–51.0)
RDW-SD: 40.7 fL (ref 35.0–51.0)
WBC: 4.4 10*3/uL (ref 4.0–11.0)
WBC: 3.5 10*3/uL — ABNORMAL LOW (ref 4.0–11.0)

## 2023-04-28 LAB — COMPREHENSIVE METABOLIC PANEL
ALT: 58 U/L — ABNORMAL HIGH (ref 0–55)
AST: 47 U/L — ABNORMAL HIGH (ref 6–42)
Albumin: 4 g/dL (ref 3.2–5.0)
Alkaline phosphatase: 59 U/L (ref 30–130)
Anion Gap: 11 mmol/L (ref 3–14)
BUN: 11 mg/dL (ref 6–24)
Bilirubin, total: 0.3 mg/dL (ref 0.2–1.2)
CO2 (Bicarbonate): 26 mmol/L (ref 20–32)
Calcium: 9.6 mg/dL (ref 8.5–10.5)
Chloride: 103 mmol/L (ref 98–110)
Creatinine: 0.8 mg/dL (ref 0.55–1.30)
Glucose: 118 mg/dL (ref 70–139)
Potassium: 3.9 mmol/L (ref 3.6–5.2)
Protein, total: 7.6 g/dL (ref 6.0–8.4)
Sodium: 140 mmol/L (ref 135–146)
eGFRcr: 111 mL/min/{1.73_m2} (ref >=60)

## 2023-04-28 LAB — PROTIME-INR
INR: 0.97
INR: 0.98
Protime: 11.2 s (ref 9.7–14.0)
Protime: 11.3 s (ref 9.7–14.0)

## 2023-04-28 LAB — MANUAL DIFFERENTIAL - SYSMEX WAM
Basophils %: 0 %
Basophils Absolute: 0 10*3/uL (ref 0.00–0.22)
Eosinophils %: 4 %
Eosinophils Absolute: 0.17 10*3/uL (ref 0.00–0.50)
Lymphocytes %: 49 %
Lymphocytes Absolute: 2.14 10*3/uL (ref 0.70–4.00)
Monocytes %: 6 %
Monocytes Absolute: 0.26 10*3/uL — ABNORMAL LOW (ref 0.36–0.83)
Neutrophils %: 37 %
Neutrophils Absolute: 1.61 10*3/uL (ref 1.50–7.95)
Reactive Lymphocytes %: 4 %
Reactive Lymphs Absolute: 0.17 10*3/uL — ABNORMAL HIGH (ref 0.00–0.00)
Total WBC Counted: 115

## 2023-04-28 LAB — BASIC METABOLIC PANEL
Anion Gap: 10 mmol/L (ref 3–14)
BUN: 9 mg/dL (ref 6–24)
CO2 (Bicarbonate): 25 mmol/L (ref 20–32)
Calcium: 9.2 mg/dL (ref 8.5–10.5)
Chloride: 104 mmol/L (ref 98–110)
Creatinine: 0.7 mg/dL (ref 0.55–1.30)
Glucose: 154 mg/dL — ABNORMAL HIGH (ref 70–139)
Potassium: 4 mmol/L (ref 3.6–5.2)
Sodium: 139 mmol/L (ref 135–146)
eGFRcr: 116 mL/min/{1.73_m2} (ref >=60)

## 2023-04-28 LAB — ANTI-XA, HEPARIN: Anti-Xa Heparin: 0.45 [IU]/mL

## 2023-04-28 LAB — PTT
aPTT: 29.3 s (ref 25.7–35.7)
aPTT: 90.6 s — CR (ref 25.7–35.7)

## 2023-04-28 LAB — TROPONIN T, HIGH SENSITIVITY
Troponin T, High Sensitivity: 16 ng/L — ABNORMAL HIGH (ref <12)
Troponin T, High Sensitivity: 14 ng/L — ABNORMAL HIGH (ref <12)
Troponin T, High Sensitivity: 12 ng/L — ABNORMAL HIGH (ref <12)

## 2023-04-28 LAB — RBC MORPHOLOGY

## 2023-04-28 LAB — LIPASE: Lipase: 21 U/L (ref 8–60)

## 2023-04-28 LAB — MAGNESIUM: Magnesium: 1.8 mg/dL (ref 1.6–2.6)

## 2023-04-28 LAB — POCT GLUCOSE: POCT Glucose: 182 mg/dL — ABNORMAL HIGH (ref 70–139)

## 2023-04-28 LAB — PHOSPHORUS: Phosphorus: 2.5 mg/dL (ref 2.4–4.9)

## 2023-04-28 MED ORDER — heparin (porcine) injection 2,500 Units
5000 | INTRAMUSCULAR | Status: DC | PRN
Start: 2023-04-28 — End: 2023-04-28

## 2023-04-28 MED ORDER — dextrose (D10W) 10 % bolus 250 mL
10 | INTRAVENOUS | Status: DC | PRN
Start: 2023-04-28 — End: 2023-04-28

## 2023-04-28 MED ORDER — atorvastatin (Lipitor) tablet 80 mg
80 | Freq: Every evening | ORAL | Status: DC
Start: 2023-04-28 — End: 2023-04-28

## 2023-04-28 MED ORDER — senna (Senokot) syrup 10 mL
8.8 | Freq: Every evening | ORAL | Status: DC | PRN
Start: 2023-04-28 — End: 2023-04-28

## 2023-04-28 MED ORDER — glucagon injection 1 mg
1 | INTRAMUSCULAR | Status: DC | PRN
Start: 2023-04-28 — End: 2023-04-28

## 2023-04-28 MED ORDER — insulin glargine (Lantus) injection 8 Units
100 | Freq: Every evening | SUBCUTANEOUS | Status: DC
Start: 2023-04-28 — End: 2023-04-28

## 2023-04-28 MED ORDER — insulin lispro (HumaLOG) injection 0-5 Units
100 | Freq: Three times a day (TID) | SUBCUTANEOUS | Status: DC
Start: 2023-04-28 — End: 2023-04-28

## 2023-04-28 MED ORDER — melatonin tablet 3 mg
3 | Freq: Every evening | ORAL | Status: DC
Start: 2023-04-28 — End: 2023-04-28

## 2023-04-28 MED ORDER — oxyCODONE (Roxicodone) immediate release tablet 10 mg
5 | Freq: Four times a day (QID) | ORAL | Status: DC | PRN
Start: 2023-04-28 — End: 2023-04-28

## 2023-04-28 MED ORDER — oxyCODONE (Roxicodone) immediate release tablet 5 mg
5 | Freq: Four times a day (QID) | ORAL | Status: DC | PRN
Start: 2023-04-28 — End: 2023-04-28

## 2023-04-28 MED ORDER — acetaminophen (Tylenol) solution 650 mg
325 | Freq: Four times a day (QID) | ORAL | Status: DC | PRN
Start: 2023-04-28 — End: 2023-04-28

## 2023-04-28 MED ORDER — dextrose 50 % in water (D50W) solution 25 mL
INTRAVENOUS | Status: DC | PRN
Start: 2023-04-28 — End: 2023-04-28

## 2023-04-28 MED ORDER — heparin (porcine) injection 5,000 Units
5000 | Freq: Once | INTRAMUSCULAR | Status: AC
Start: 2023-04-28 — End: 2023-04-28
  Administered 2023-04-28: 09:00:00 5000 [IU] via INTRAVENOUS

## 2023-04-28 MED ORDER — acetaminophen (Tylenol) tablet 650 mg
325 | Freq: Four times a day (QID) | ORAL | Status: DC | PRN
Start: 2023-04-28 — End: 2023-04-28

## 2023-04-28 MED ORDER — dextrose (D10W) 10 % bolus 125 mL
10 | INTRAVENOUS | Status: DC | PRN
Start: 2023-04-28 — End: 2023-04-28

## 2023-04-28 MED ORDER — HYDROmorphone (Dilaudid) injection 0.2 mg
1 | Freq: Once | INTRAMUSCULAR | Status: DC
Start: 2023-04-28 — End: 2023-04-28

## 2023-04-28 MED ORDER — sennosides (Senokot) tablet 17.2 mg
8.6 | Freq: Every evening | ORAL | Status: DC | PRN
Start: 2023-04-28 — End: 2023-04-28

## 2023-04-28 MED ORDER — heparin (porcine) injection 4,000 Units
5000 | INTRAMUSCULAR | Status: DC | PRN
Start: 2023-04-28 — End: 2023-04-28

## 2023-04-28 MED ORDER — acetaminophen (Tylenol) suppository 650 mg
650 | Freq: Four times a day (QID) | RECTAL | Status: DC | PRN
Start: 2023-04-28 — End: 2023-04-28

## 2023-04-28 MED ORDER — dextrose 50 % in water (D50W) solution 50 mL
INTRAVENOUS | Status: DC | PRN
Start: 2023-04-28 — End: 2023-04-28

## 2023-04-28 MED ORDER — metoprolol tartrate (Lopressor) tablet 25 mg
25 | Freq: Two times a day (BID) | ORAL | Status: DC
Start: 2023-04-28 — End: 2023-04-28

## 2023-04-28 MED ORDER — aspirin EC tablet 81 mg
81 | Freq: Every day | ORAL | Status: DC
Start: 2023-04-28 — End: 2023-04-28

## 2023-04-28 MED ORDER — heparin 25,000 units/250 mL (100 units/mL) in 0.45% NS (premix)
100 | INTRAVENOUS | Status: DC
Start: 2023-04-28 — End: 2023-04-28
  Administered 2023-04-28: 09:00:00 12 [IU]/kg/h via INTRAVENOUS

## 2023-04-28 MED FILL — HYDROMORPHONE 1 MG/ML INJECTION WRAPPER: 1 1 mg/mL | INTRAMUSCULAR | Qty: 1

## 2023-04-28 MED FILL — ACETAMINOPHEN 325 MG TABLET: 325 325 mg | ORAL | Qty: 3

## 2023-04-28 MED FILL — HEPARIN (PORCINE) 5,000 UNIT/ML INJECTION SOLUTION: 5000 5,000 unit/mL | INTRAMUSCULAR | Qty: 1

## 2023-04-28 MED FILL — HEPARIN (PORCINE) 25,000 UNIT/250 ML IN 0.45 % SODIUM CHLORIDE IV SOLN: 100 100 units/mL | INTRAVENOUS | Qty: 250

## 2023-04-28 NOTE — H&P (Signed)
[facility] Admission History & Physical Note    PATIENT: [name]  MRN: [id number]   DATE OF ADMISSION: 04/27/2023  TIME: 1:47 AM  HOSPITAL FLOOR/LOCATION:   PCP: No PCP, Per Patient    CHIEF COMPLAINT:     Chief Complaint   Patient presents with    Chest Pain          HISTORY OF PRESENT ILLNESS:   [name] is a 47 y.o. 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      Patient started to have retrosternal crushing type of chest pain [10 out of 10] 1 hour before ED visit, patient was not exerting himself at the time of chest pain.  The pain radiates radiated to shoulder, jaw and left upper arm.  The quality of pain is similar with the type of pain he have in all 3 episode when he had MI.  Patient endorses 7 out of 10 crushing chest pain at the time of evaluation.  He work as a English as a second language teacher, he endorsed intermittent chest pain unrelated to exertion, sometimes pain-free walking more than 2 mile and sometimes the pain started while at rest.    Of note he had left heart catheterization done in 2020 which showed mild RCA irregularity, no left coronary artery disease.  Stress test done on 06/2022 with a stress echocardiogram was nondiagnostic, labeled not able to target heart rate as the patient felt lightheaded and presyncopal.  But no regional wall motion abnormality was seen.  Nuclear medicine perfusion test done on 03/2022 showed normal pharmacologic myocardial perfusion study day test with no evidence of ischemia or scar with EF of 60%    ED Course:   In the ED patient was given 324 mg of aspirin en route to ED.  EKG no significant ST-T abnormality troponin 16>> 14.    Initial Vitals: 36.6 C (97.8 F)  HR 84  BP (!) 164/96  RR 18   SpO2 98 % on room air.  Weight 120.2 kg     Review of Systems:   Pertinent positive and negative as per HPI, otherwise a 10-point review  of systems was negative.     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)    HLD (hyperlipidemia) (CMS-HCC)    Essential hypertension (CMS-HCC)    Congenital anomaly of artery (HHS-HCC)       PAST SURGICAL HISTORY:     Past Surgical History:   Procedure Laterality Date    ARTERIAL BYPASS SURGERY      x2    CHOLECYSTECTOMY      CORONARY ARTERY BYPASS GRAFT      CTA ABDOMEN PELVIS W AND WO CONTRAST  12/03/2022    CTA ABDOMEN  PELVIS W AND WO CONTRAST 12/03/2022        FAMILY HISTORY:     Family History   Problem Relation Name Age of Onset    Heart disease Father         SOCIAL HISTORY:     Social History     Tobacco Use    Smoking status: Every Day     Current packs/day: 0.50     Types: Cigarettes     Passive exposure: Current    Smokeless tobacco: Never   Substance Use Topics    Alcohol use: Never    Drug use: Never      Quit smoking month back, no IV drug use.    PHYSICAL EXAM ON ADMISSION:   Vitals:   Temp:  [36.6 C (97.8 F)] 36.6 C (97.8 F)  Pulse:  [65-84] 65  Resp:  [16-29] 16  SpO2:  [95 %-100 %] 96 %  BP: (132-164)/(59-96) 135/65   SpO2: 96 %    Physical Examination:  General: well-appearing, in no acute distress, resting comfortably  HEENT: normocephalic/atraumatic, extra-ocular movements intact, mucus membranes moist  CV: regular rate and rhythm, no murmurs, rubs or gallops, normal S1/S2  Pulm: clear to auscultation bilaterally, no wheezing, crackles or rhonchi  Abd: soft, nondistended, nontender to palpation, +BS  Ext: warm and well perfused, no edema  Skin: no major  lesions or rashes noted  Neuro: non-focal    LAB RESULTS:   Labs reviewed in the chart, notable for:  Lab Results   Component Value Date    NA 140 04/27/2023    K 3.9 04/27/2023    CL 103 04/27/2023    CO2 26 04/27/2023    BUN 11 04/27/2023    CREATININE 0.80 04/27/2023     Lab Results   Component Value Date    WBC 4.4 04/27/2023    HGB 13.5 04/27/2023    HCT 40.8 04/27/2023    MCV 90.5 04/27/2023    PLT 236 04/27/2023     Lab Results   Component Value Date    ALT 58 (H) 04/27/2023    AST 47 (H) 04/27/2023    BILITOT 0.3 04/27/2023    PROT 7.6 04/27/2023    ALBUMIN 4.0 04/27/2023     Lab Results   Component Value Date    PT 11.2 04/27/2023    INR 0.97 04/27/2023       Micro reviewed in the chart, notable for:   Blood Cultures: No results found for: "BLOODCX"   Urine Cultures: No results found for: "URINECX"            140 103 11 / 118   3.9 26 0.80 \       4.4 \ 13.5 / 236    / 40.8 \              IMAGING/RADIOLOGY/CARDIAC TESTING:   Imaging reviewed in the chart, notable for:  XR CHEST 2 VIEWS   Preliminary Result   No acute pulmonary abnormality.      DICTATED: Cline Crock   IN [facility] WITH DEPARTMENT POLICY, TEACHING PHYSICIANS REVIEW ALL IMAGES, AND EDIT REPORTS AS REQUIRED. A REPORT IS NOT FINAL UNTIL APPROVED BY A STAFF RADIOLOGIST.           EKG: Sinus rhythm with no ST depression/elevation      Last TTE:    No echocardiogram results found for the past 12 months     HOME MEDICATIONS:   List confirmed  with: patient or family member.  Current Outpatient Medications   Medication Instructions    aspirin 81 mg, oral, Daily RT    atorvastatin (LIPITOR) 40 mg, oral, Daily RT    CLINDAMYCIN HCL ORAL oral    insulin glargine (LANTUS) 15 Units, subcutaneous, Nightly    metoprolol succinate XL (TOPROL-XL) 25 mg, oral, 2 times daily    metoprolol tartrate (LOPRESSOR) 25 mg, oral, 2 times daily    Ventolin HFA 90 mcg/actuation inhaler 2 puffs, inhalation, Every 6 hours PRN        ASSESSMENT & PLAN:   [name]  is a 47 y.o. 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  Troponin flat, given extensive cardiovascular history and classic chest pain will treat as ACS-unstable angina.  Last left heart cath and nuclear stress testing no evidence significant CAD except mild irregularity at the RCA. -Lipid profile 2 weeks back [LDL 100, HDL 32, total cholesterol 161], A1c of 7.7 from 2 weeks back. Patient allergic to nitroglycerin, dimension pain improved with Dilaudid.  -Heparin GTT with anti-DNA protocol [0.3-0.7]  -S/p aspirin 324 mg p.o.  -Continue aspirin 81 mg daily  -Continue metoprolol 25 mg p.o. twice daily  -Continue atorvastatin 80 mg daily  -F/U hemoglobin A1c  -Tylenol/oxycodone for pain  -Dilaudid for breakthrough pain, could consider Imdur for persistent chest pain if blood pressure tolerate  -Cardiology consult in the morning for consideration of LHC versus stress test    Chronic/resolved issue  #Type II DM  Type II DM on basal Lantus at home.  -Hemoglobin A1c  -Basal Lantus 8 units  -Medium dose sliding scale insulin            Hospital Bundle   Code Status: Full Code  Access:   Patient Lines/Drains/Airways Status       Active .       Name Placement date Placement time Site Days    Peripheral IV 04/27/23 Anterior;Proximal;Right Forearm 04/27/23  2315  Forearm  less than 1                  All line are reviewed and necessary.  Diet: NPO diet  DVT Prophylaxis: On therapeutic AC  Contact: @PRIMEMERCNT (<SUBSCRIPT> error)@     @PRIMEMERCNTPH (<SUBSCRIPT> error)@      ___________________________    Otto Herb, MD  PGY-3, Internal Medicine  [facility]

## 2023-04-28 NOTE — Discharge Summary (Signed)
Discharge Summary    Discharge Diagnosis  Chest pain, unspecified type    Hospital Course   Discharge Provider: Canary Brim, MD  Primary Care Physician: No PCP, Per Patient  Admission Dates: 04/27/2023 - 04/28/23    Brief Hospital Course: [name] is a 47 y.o. adult with a medical history of anomalous RCA (status post CABG in 2007, 2012) and reported NSTEMI 2023 who presented with 1 hour of retrosternal chest pain.  EKG unchanged from prior, high-sensitivity troponin was 16> 14> 12.  Patient endorsed 8/10 left-sided chest pain described as pressure radiating down his left arm and jaw.  Endorses allergy to nitro products, declined Tylenol, explained IV Dilaudid was the only medication that helps him.  He referred to a prior hospitalization when he crushed up oral narcotics to swallow them better and was "accused of being a drug addict".  In the ED, he was loaded with aspirin and started on heparin drip.  Fortunately, he has had multiple TTE's, nuclear and exercise stress tests, and LHC without evidence of significant CAD, only notable for mild irregularity at the RCA.  Given multiple reassuring prior cardiac testing, unchanged EKG, and minimally elevated and downtrending troponins, minimal concern for cardiac etiology of his chest pain.  Patient also mentioned he has difficulty with swallowing, chest pain may be explained by esophageal spasm, hiatal hernia, etc.  Upon sharing with patient that there was low concern for cardiac etiology, he became very upset.  We recommended he follow-up with his PCP [name] in [place]) or cardiologist ([facility], seen once) for further evaluation as an outpatient.     The patient was discharged to home with no services.    Items for follow-up:   []  Recommend reestablishing care with cardiology  []  Recommend PCP follow-up for further evaluation of noncardiac causes of chest pain including esophageal spasm, hiatal hernia, costochondritis    Test Results Pending at  Discharge  none    Outpatient referrals: none    Outpatient Follow-Up  No future appointments.    Major medication changes: none    --------------------------------------  HOSPITAL COURSE BY PROBLEM  # History of anomalous RCA status post CABG x 2  #Reported history of NSTEMI 2023  Patient endorsed 1 hour of left-sided chest pain that radiated down his arm and to his jaw.  Reports it comes and goes, describes it as an 8/10.  Unable to tolerate nitro given reports of hives and anaphylaxis.  Unable to swallow pills whole and refused to have pain medications crushed up given prior reported accusations of "looking like a drug addict".  Requested IV Dilaudid for left-sided chest pain, which initially provided relief in ED.  Fortunately, patient's troponins were low and downtrending, EKG unchanged.  Upon further chart review, patient has undergone multiple cardiac studies between [facility], [facility], [place] [facility]. He had left heart catheterization done in 2020 which showed mild RCA irregularity, no left coronary artery disease.  Nuclear medicine perfusion test done on 03/2022 showed normal pharmacologic myocardial perfusion study with no evidence of ischemia or scar with EF of 60%. Stress test done on 06/2022 with a stress echocardiogram was nondiagnostic, labeled not able to target heart rate as the patient felt lightheaded and presyncopal.  But no regional wall motion abnormality was seen.  TTE 04/10/2023 at [facility] showed moderate-severe posterior LVH with normal chamber size and systolic function, EF 65%.  Normal RV size and systolic function.  Mild aortic insufficiency.  Systolic anterior motion of mitral valve with  mild MR.  No outflow tract obstruction.  Mild LAE.    Given multiple stable cardiac tests and nonischemic EKG with negative troponins, etiology of chest pain felt to be unlikely cardiac etiology.  Recommended patient follow-up closely with his cardiologist at [facility] as well as his PCP in  [facility] for further evaluation as an outpatient.     ------------------------------------  NOTABLE IMAGING FINDINGS  === 04/27/23 ===    XR CHEST 2 VIEWS    - Impression -  No acute pulmonary abnormality.    DICTATED: Cline Crock    --------------------------------------  Home Medications After Discharge    Scheduled   aspirin 81 mg capsule, Take 81 mg by mouth in the morning.   atorvastatin (Lipitor) 10 mg tablet, Take 40 mg by mouth in the morning.   insulin glargine (Lantus) 100 unit/mL (3 mL) injection, Inject 15 Units under the skin at bedtime.   metoprolol succinate XL (Toprol-XL) 50 mg 24 hr tablet, Take 25 mg by mouth twice daily.   metoprolol tartrate (Lopressor) 25 mg tablet, Take 25 mg by mouth in the morning and 25 mg in the evening.    PRN   Ventolin HFA 90 mcg/actuation inhaler, Inhale 2 puffs every 6 (six) hours if needed.    No Frequency   CLINDAMYCIN HCL ORAL, Take by mouth.         Test Results Pending At Discharge    Pertinent Physical Exam At Time of Discharge  Physical Exam  Vitals reviewed.   Constitutional:       General: [name] is not in acute distress.     Appearance: Normal appearance. [name] is not ill-appearing or toxic-appearing.   HENT:      Head: Normocephalic and atraumatic.      Mouth/Throat:      Mouth: Mucous membranes are moist.   Cardiovascular:      Rate and Rhythm: Normal rate and regular rhythm.      Pulses: Normal pulses.      Comments: Midline surgical scar, well healed   Pulmonary:      Effort: Pulmonary effort is normal. No respiratory distress.      Breath sounds: Normal breath sounds. No wheezing, rhonchi or rales.   Abdominal:      Palpations: Abdomen is soft.      Tenderness: There is no abdominal tenderness.   Musculoskeletal:      Right lower leg: No edema.      Left lower leg: No edema.   Skin:     General: Skin is warm and dry.      Capillary Refill: Capillary refill takes less than 2 seconds.   Neurological:      General: No focal deficit present.      Mental  Status: [name] is alert.   Psychiatric:         Mood and Affect: Mood normal.         Thought Content: Thought content normal.                          Outpatient Follow-Up  No future appointments.    ___________________________  Leslie Dales, MD, PGY-3  Internal Medicine

## 2023-04-28 NOTE — Care Plan (Signed)
Goal Outcome Evaluation: Discharge   A/o x3 Vs as charted NSB-NSR Pt had chest pain earlier - ekg done no changes per MD. NO additional treatment troponins negative. Pt blood sugar normal. Attending/resident up to review the plan to discharge him no new treatment plan at this time after reviewing labs/tests and talking with Cardiology. Pt frustrated with the plan not willing to stay for the discharge papers after the heparin /IV was removed. Pt had crackers and juice - walked out .

## 2023-04-28 NOTE — Hospital Course (Signed)
Discharge Provider: Canary Brim, MD  Primary Care Physician: No PCP, Per Patient  Admission Dates: 04/27/2023 - 04/28/23    Brief Hospital Course: [name] is a 47 y.o. adult with a medical history of anomalous RCA (status post CABG in 2007, 2012) and reported NSTEMI 2023 who presented with 1 hour of retrosternal chest pain.  EKG unchanged from prior, high-sensitivity troponin was 16> 14> 12.  Patient endorsed 8/10 left-sided chest pain described as pressure radiating down his left arm and jaw.  Endorses allergy to nitro products, declined Tylenol, explained IV Dilaudid was the only medication that helps him.  He referred to a prior hospitalization when he crushed up oral narcotics to swallow them better and was "accused of being a drug addict".  In the ED, he was loaded with aspirin and started on heparin drip.  Fortunately, he has had multiple TTE's, nuclear and exercise stress tests, and LHC without evidence of significant CAD, only notable for mild irregularity at the RCA.  Given multiple reassuring prior cardiac testing, unchanged EKG, and minimally elevated and downtrending troponins, minimal concern for cardiac etiology of his chest pain.  Patient also mentioned he has difficulty with swallowing, chest pain may be explained by esophageal spasm, hiatal hernia, etc.  Upon sharing with patient that there was low concern for cardiac etiology, he became very upset.  We recommended he follow-up with his PCP [name] in [place]) or cardiologist ([facility], seen once) for further evaluation as an outpatient.     The patient was discharged to home with no services.    Items for follow-up:   []  Recommend reestablishing care with cardiology  []  Recommend PCP follow-up for further evaluation of noncardiac causes of chest pain including esophageal spasm, hiatal hernia, costochondritis    Test Results Pending at Discharge  none    Outpatient referrals: none    Outpatient Follow-Up  No future appointments.    Major  medication changes: none    --------------------------------------  HOSPITAL COURSE BY PROBLEM  # History of anomalous RCA status post CABG x 2  #Reported history of NSTEMI 2023  Patient endorsed 1 hour of left-sided chest pain that radiated down his arm and to his jaw.  Reports it comes and goes, describes it as an 8/10.  Unable to tolerate nitro given reports of hives and anaphylaxis.  Unable to swallow pills whole and refused to have pain medications crushed up given prior reported accusations of "looking like a drug addict".  Requested IV Dilaudid for left-sided chest pain, which initially provided relief in ED.  Fortunately, patient's troponins were low and downtrending, EKG unchanged.  Upon further chart review, patient has undergone multiple cardiac studies between [facility], [facility], [facility]. He had left heart catheterization done in 2020 which showed mild RCA irregularity, no left coronary artery disease.  Nuclear medicine perfusion test done on 03/2022 showed normal pharmacologic myocardial perfusion study with no evidence of ischemia or scar with EF of 60%. Stress test done on 06/2022 with a stress echocardiogram was nondiagnostic, labeled not able to target heart rate as the patient felt lightheaded and presyncopal.  But no regional wall motion abnormality was seen.  TTE 04/10/2023 at [facility] showed moderate-severe posterior LVH with normal chamber size and systolic function, EF 65%.  Normal RV size and systolic function.  Mild aortic insufficiency.  Systolic anterior motion of mitral valve with mild MR.  No outflow tract obstruction.  Mild LAE.    Given multiple stable cardiac tests and  nonischemic EKG with negative troponins, etiology of chest pain felt to be unlikely cardiac etiology.  Recommended patient follow-up closely with his cardiologist at [facility] as well as his PCP in [facility] for further evaluation as an outpatient.     ------------------------------------  NOTABLE IMAGING  FINDINGS  === 04/27/23 ===    XR CHEST 2 VIEWS    - Impression -  No acute pulmonary abnormality.    DICTATED: [name]    --------------------------------------  Home Medications After Discharge    Scheduled   aspirin 81 mg capsule, Take 81 mg by mouth in the morning.   atorvastatin (Lipitor) 10 mg tablet, Take 40 mg by mouth in the morning.   insulin glargine (Lantus) 100 unit/mL (3 mL) injection, Inject 15 Units under the skin at bedtime.   metoprolol succinate XL (Toprol-XL) 50 mg 24 hr tablet, Take 25 mg by mouth twice daily.   metoprolol tartrate (Lopressor) 25 mg tablet, Take 25 mg by mouth in the morning and 25 mg in the evening.    PRN   Ventolin HFA 90 mcg/actuation inhaler, Inhale 2 puffs every 6 (six) hours if needed.    No Frequency   CLINDAMYCIN HCL ORAL, Take by mouth.

## 2023-04-28 NOTE — Nursing Note (Signed)
Patient complaining of chest pain 8/10 pain radiating down left arm and across chest. He has nausea no SoB. His vitals 128/70 HR 70 RR 20. Pt in bed resting comfortably . Requesting dilaudid for the pain - unable to take nitroglycerin and morphine due to allergies. Team notified.

## 2023-04-28 NOTE — Discharge Instructions (Signed)
Dear [name],    You were recently admitted to [facility] for chest pain. While you were here, we checked your cardiac enzymes which were reassuringly stable. Your EKG did not show evidence of new heart damage. We have a low suspicion that the chest pain you experience is coming from your heart. We recommend establishing care with a cardiologist and seeing your PCP for further evaluation.      Please see the instructions below for the exact medications that you should take as well as symptoms for which you should seek further care. Please do not hesitate to contact us with any questions or concerns. Your scheduled follow up appointments are attached. Thank you for allowing [place] to participate in your care. It was a pleasure taking care of you while you were here.     Best Regards,  Your Inpatient Medicine Team at [facility]      -------------------------------------------------------------------------------------------------    Please call your primary care doctor or go to an emergency room or call 911 if you are experiencing any of the following symptoms:   Shortness of breath   Persistent cough associated with sputum or fever or both   Light-headedness/loss of consciousness/ dizziness   Persistent nausea, vomiting, or severe abdominal pain  Chest pain/palpitations/sudden sweating with radiation down both arms   Confusion   Any other concerning symptoms   In case of an emergency, please call 911, and not your PCP's office, in order to get immediate medical attention     Medication Changes   1. None of your medications were changed during your visit   2. Please refer to your medication reconciliation sheet for a full list of your discharge medications.   3. Do not start any new medications without discussing them with your PCP.     Appointments   No future appointments.  1. PCP: Please follow up with your primary care doctor within 1-2 weeks of discharge regarding this admission.   2.  Cardiology: Please reestablish care with a cardiologist given your extensive cardiac history.    General Discharge Instructions    Diet: as tolerated    Activity: as tolerated   Make sure to bring this paperwork with you to all your follow-up and primary care appointments regarding this admission.

## 2023-04-28 NOTE — Progress Notes (Incomplete)
[name], [name], [name]/Lungs:  Lungs CTAB. No wheezes, crackles, ronchi. No increased WOB.  CV: RRR, normal S1/S2, no m/r/g.   Abd: NTND. Soft to palpation throughout. Normal bowel sounds. ***  Ext: Warm, well-perfused. Strong distal pulses. *** pedal edema. Normal range of motion. ***  Skin: No rashes or acute skin changes noted over exposed areas. ***  Psych: Euthymic with normal affect. ***        Assessment/Plan    ASSESSMENT AND PLAN:   [name] is a 47 y.o. adult with a past medical history of anomalous RCA origin complicated by ACS x 3 [CABG in 2007, 2012] NSTEMI 2023 present today with retrosternal [name] pain of 1 hours duration.       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  Troponin flat, given extensive cardiovascular history and classic [name] pain will treat as ACS-unstable angina.  Last left heart cath and nuclear stress testing no evidence significant CAD except mild irregularity at the RCA. -Lipid profile 2 weeks back [LDL 100, HDL 32, total cholesterol 960], A1c of 7.7 from 2 weeks back. Patient allergic to nitroglycerin, dimension pain improved with Dilaudid.  -Heparin GTT with anti-DNA protocol [0.3-0.7]  -S/p aspirin 324 mg p.o.  -Continue aspirin 81 mg daily  -Continue metoprolol 25 mg p.o. twice daily  -Continue atorvastatin 80 mg daily  -F/U hemoglobin  A1c  -Tylenol/oxycodone for pain  -Dilaudid for breakthrough pain, could consider Imdur for persistent [name] pain if blood pressure tolerate  -Cardiology consult in the morning for consideration of LHC versus stress test     Chronic/resolved issue  #Type II DM  Type II DM on basal Lantus at home.  -Hemoglobin A1c  -Basal Lantus 8 units  -Medium dose sliding scale insulin    Hospital Bundle:  Code status: Full Code  Lines:   Patient Lines/Drains/Airways Status       Active .       Name Placement date Placement time Site Days    Peripheral IV 04/27/23 Anterior;Proximal;Right Forearm 04/27/23  2315  Forearm  less than 1                  Diet: NPO diet  DVT Prophylaxis: On therapeutic AC  Contact:  CONTACT: @PRIMEMERCNT (<SUBSCRIPT> error)@     @PRIMEMERCNTPH (<SUBSCRIPT> error)@ ***  _____________________________    ___________________________  Leslie Dales, MD, PGY-3  Internal Medicine

## 2023-05-07 ENCOUNTER — Inpatient Hospital Stay
Admit: 2023-05-07 | Discharge: 2023-05-08 | Discharge status: Against Medical Advice | Payer: MEDICAID | Attending: Emergency Medicine

## 2023-05-07 ENCOUNTER — Emergency Department: Admit: 2023-05-07 | Payer: MEDICAID

## 2023-05-07 DIAGNOSIS — R0789 Other chest pain: Secondary | ICD-10-CM

## 2023-05-07 DIAGNOSIS — R079 Chest pain, unspecified: Secondary | ICD-10-CM

## 2023-05-07 LAB — CBC WITH DIFFERENTIAL
Hematocrit: 41.4 % (ref 32.0–53.0)
Hemoglobin: 13.3 g/dL (ref 13.0–17.5)
MCH: 29.6 pg (ref 26.0–34.0)
MCHC: 32.1 g/dL (ref 31.0–37.0)
MCV: 92.2 fL (ref 80.0–100.0)
MPV: 10.4 fL (ref 9.1–12.4)
NRBC %: 0 % (ref 0.0–0.0)
NRBC Absolute: 0 10*3/uL (ref 0.00–2.00)
Platelets: 210 10*3/uL (ref 150–400)
RBC: 4.49 M/uL (ref 4.20–5.90)
RDW-CV: 12.4 % (ref 11.5–14.5)
RDW-SD: 42.1 fL (ref 35.0–51.0)
WBC: 4.8 10*3/uL (ref 4.0–11.0)

## 2023-05-07 LAB — MANUAL DIFFERENTIAL - SYSMEX WAM
Basophils %: 0 %
Basophils Absolute: 0 10*3/uL (ref 0.00–0.22)
Eosinophils %: 0 %
Eosinophils Absolute: 0 10*3/uL (ref 0.00–0.50)
Lymphocytes %: 32 %
Lymphocytes Absolute: 1.53 10*3/uL (ref 0.70–4.00)
Monocytes %: 5 %
Monocytes Absolute: 0.24 10*3/uL — ABNORMAL LOW (ref 0.36–0.83)
Neutrophils %: 60 %
Neutrophils Absolute: 2.87 10*3/uL (ref 1.50–7.95)
Reactive Lymphocytes %: 3 %
Reactive Lymphs Absolute: 0.14 10*3/uL — ABNORMAL HIGH (ref 0.00–0.00)
Total WBC Counted: 116

## 2023-05-07 LAB — COMPREHENSIVE METABOLIC PANEL
ALT: 67 U/L — ABNORMAL HIGH (ref 0–55)
AST: 46 U/L — ABNORMAL HIGH (ref 6–42)
Albumin: 4.3 g/dL (ref 3.2–5.0)
Alkaline phosphatase: 61 U/L (ref 30–130)
Anion Gap: 11 mmol/L (ref 3–14)
BUN: 11 mg/dL (ref 6–24)
Bilirubin, total: 0.3 mg/dL (ref 0.2–1.2)
CO2 (Bicarbonate): 26 mmol/L (ref 20–32)
Calcium: 9.9 mg/dL (ref 8.5–10.5)
Chloride: 103 mmol/L (ref 98–110)
Creatinine: 0.83 mg/dL (ref 0.55–1.30)
Glucose: 174 mg/dL — ABNORMAL HIGH (ref 70–139)
Potassium: 4.1 mmol/L (ref 3.6–5.2)
Protein, total: 8.2 g/dL (ref 6.0–8.4)
Sodium: 140 mmol/L (ref 135–146)
eGFRcr: 110 mL/min/{1.73_m2} (ref >=60)

## 2023-05-07 LAB — PROTIME-INR
INR: 1.02
Protime: 11.7 s (ref 9.7–14.0)

## 2023-05-07 LAB — PTT: aPTT: 32.4 s (ref 25.7–35.7)

## 2023-05-07 LAB — POCT GLUCOSE: POCT Glucose: 176 mg/dL — ABNORMAL HIGH (ref 70–139)

## 2023-05-07 LAB — NT PRO BNP: NT-proBNP: 83 pg/mL (ref 0–125)

## 2023-05-07 LAB — TROPONIN T, HIGH SENSITIVITY
Troponin T, High Sensitivity: 13 ng/L — ABNORMAL HIGH (ref <12)
Troponin T, High Sensitivity: 17 ng/L — ABNORMAL HIGH (ref <12)

## 2023-05-07 LAB — D-DIMER, QUANTITATIVE: D-DIMER: 402 ng{FEU}/mL (ref 215.00–500.00)

## 2023-05-07 MED ORDER — HYDROmorphone (Dilaudid) injection 0.5 mg
1 | Freq: Once | INTRAMUSCULAR | Status: AC
Start: 2023-05-07 — End: 2023-05-07
  Administered 2023-05-07: 21:00:00 0.5 mg via INTRAVENOUS

## 2023-05-07 MED ORDER — oxyCODONE (Roxicodone) immediate release tablet 5 mg
5 | Freq: Once | ORAL | Status: AC
Start: 2023-05-07 — End: 2023-05-07
  Administered 2023-05-07: 23:00:00 5 mg via ORAL

## 2023-05-07 MED ORDER — ondansetron (Zofran) injection 4 mg
4 | Freq: Once | INTRAMUSCULAR | Status: AC
Start: 2023-05-07 — End: 2023-05-07
  Administered 2023-05-07: 21:00:00 4 mg via INTRAVENOUS

## 2023-05-07 MED FILL — ONDANSETRON HCL (PF) 4 MG/2 ML INJECTION SOLUTION: 4 4 mg/2 mL | INTRAMUSCULAR | Qty: 2

## 2023-05-07 MED FILL — HYDROMORPHONE 1 MG/ML INJECTION WRAPPER: 1 1 mg/mL | INTRAMUSCULAR | Qty: 1

## 2023-05-07 MED FILL — OXYCODONE 5 MG TABLET: 5 5 mg | ORAL | Qty: 1

## 2023-05-07 NOTE — Unmapped (Signed)
Patient: [name]   MRN: [id number]   Date of service: 05/07/2023   PCP: Tacy Learn, MD     EMERGENCY DEPARTMENT ENCOUNTER    CHIEF COMPLAINT       Chief Complaint   Patient presents with    Chest Pain       HPI     [name] is a 47 y.o. adult with a PMH of anomalous RCA (status post CABG in 2007, 2012), diabetes, hypertension and reported NSTEMI 2023 who presents to the emergency department via EMS for complaints of chest pain.  The patient reports that he began having chest pain approximately 30 minutes prior to arrival.  He reports the pain is located on the left side of his chest and radiates to his left shoulder and left jaw.  He denies any shortness of breath, cough, fevers or palpitations.  He did receive 324 of aspirin en route with EMS.  He reports that he is allergic to nitroglycerin.  He states that this feels similar to his previous chest pain presentations.    Per review of EDIE, patient has had 75 emergency department visits in the last year for complaints of chest pain.  He has been seen at multiple emergency departments across [place] in the state of [place], [place] and [place].  On 05/02/2023, patient was seen and evaluated at [facility] emergency department for chest pain.  Workup reassuring and discharged home.  On 04/27/2023, patient was admitted to the hospital here for chest pain overnight for observation with reassuring workup.  The patient had echocardiogram done at [facility] on 04/10/2023 which revealed moderate ventricular hypertrophy but normal EF and no other significant findings.  Previous stress echo in February 2024 at [facility].  CT chest done at [facility] on 04/10/2023 with no acute findings.      Interpreter used: None    REVIEW OF SYSTEMS     Review of Systems   Constitutional:  Negative for appetite change, chills, fatigue and fever.   HENT:  Negative for congestion, rhinorrhea and sore throat.    Eyes:  Negative  for photophobia and visual disturbance.   Respiratory:  Negative for cough and shortness of breath.    Cardiovascular:  Positive for chest pain. Negative for palpitations.   Gastrointestinal:  Negative for abdominal pain, constipation, diarrhea, nausea and vomiting.   Genitourinary:  Negative for difficulty urinating, dysuria, flank pain, frequency and hematuria.   Musculoskeletal:  Negative for arthralgias.   Skin:  Negative for rash.   Neurological:  Negative for dizziness, syncope, weakness, light-headedness, numbness and headaches.   Hematological:  Does not bruise/bleed easily.   Psychiatric/Behavioral:  Negative for behavioral problems, confusion and suicidal ideas.    All other systems reviewed and are negative.    PAST MEDICAL HISTORY         Past Medical History:   Diagnosis Date    Coronary artery disease (CMS-HCC)     Diabetes mellitus (Multi-HCC)     Heart attack (Multi-HCC)     x3    Hypertension (CMS-HCC)     pt denies having high BP       CURRENT MEDICATIONS       Previous Medications    ASPIRIN 81 MG CAPSULE    Take 81 mg by mouth in the morning.    ATORVASTATIN (LIPITOR) 10 MG TABLET    Take 40 mg by mouth in the morning.    CLINDAMYCIN  HCL ORAL    Take by mouth.    INSULIN GLARGINE (LANTUS) 100 UNIT/ML (3 ML) INJECTION    Inject 15 Units under the skin at bedtime.    METOPROLOL SUCCINATE XL (TOPROL-XL) 50 MG 24 HR TABLET    Take 25 mg by mouth twice daily.    METOPROLOL TARTRATE (LOPRESSOR) 25 MG TABLET    Take 25 mg by mouth in the morning and 25 mg in the evening.    VENTOLIN HFA 90 MCG/ACTUATION INHALER    Inhale 2 puffs every 6 (six) hours if needed.         FAMILY HISTORY         Family History   Problem Relation Name Age of Onset    Heart disease Father         SURGICAL HISTORY         Past Surgical History:   Procedure Laterality Date    ARTERIAL BYPASS SURGERY      x2    CHOLECYSTECTOMY      CORONARY ARTERY BYPASS GRAFT      CTA ABDOMEN PELVIS W AND WO CONTRAST  12/03/2022    CTA ABDOMEN  PELVIS W AND WO CONTRAST 12/03/2022       ALLERGIES         Allergies   Allergen Reactions    Ibuprofen Anaphylaxis, Hives, Itching and Rash     Other reaction(s): Anaphylactic reaction, Unknown, Urticaria  anaphylaxis  anaphylaxis      Ketorolac Anaphylaxis, Hives and Rash     Other reaction(s): ITCHY HIVES, Unknown, Urticaria  Converted from Generic Allergy: Ketorolac  Anaphylaxis   Anaphylaxis   Tolerates ASA      Nitroglycerin      hives    Tramadol Anaphylaxis, Angioedema, Hives and Rash     Other reaction(s): THROAT CLOSES, Unknown, Urticaria  Patient confirmed that he can take Percocet, Hydromorphone and Morphine. [name], Pharmacy  anaphylaxis  anaphylaxis      Codeine        PHYSICAL EXAM        VITAL SIGNS:    ED Triage Vitals   Temp Pulse Resp BP   05/07/23 1530 05/07/23 1528 05/07/23 1528 05/07/23 1528   36.8 C (98.3 F) 81 22 (!) 165/97      SpO2 Temp Source Heart Rate Source Patient Position   05/07/23 1528 05/07/23 1530 05/07/23 1528 05/07/23 1528   99 % Oral Monitor Sitting      BP Location FiO2 (%)     05/07/23 1528 --     Left arm           General: The patient appears awake, alert, non-toxic, and in no acute distress.  Head: Normocephalic, atraumatic.   Eyes: PERRL.  ENT: Moist mucus membranes.  Neck: Supple, trachea midline.  Respiratory: CTAB, non-labored breathing, symmetrical chest expansion, no respiratory distress.  CVS: Regular rate and rhythm, no peripheral edema.   GI: Soft, non-tender. Normoactive bowel sounds in all quadrants. No distention, masses or guarding.   Neuro: GCS15, AOx4, cranial nerves II-XII grossly intact, mentation is appropriate for age, moving all 4 extremities, steady gait.  Psych: Behavior is cooperative and pleasant. Affect is calm.  Skin: Warm, dry and intact. Normal capillary refill.     RESULTS        LABS  Labs Reviewed   COMPREHENSIVE METABOLIC PANEL - Abnormal       Result Value    Sodium 140  Potassium 4.1      Chloride 103      CO2 (Bicarbonate) 26       Anion Gap 11      BUN 11      Creatinine 0.83      eGFRcr 110      Glucose 174 (*)     Fasting? Unknown      Calcium 9.9      AST 46 (*)     ALT 67 (*)     Alkaline phosphatase 61      Protein, total 8.2      Albumin 4.3      Bilirubin, total 0.3     TROPONIN T, HIGH SENSITIVITY - Abnormal    Troponin T, High Sensitivity 13 (*)    TROPONIN T, HIGH SENSITIVITY - Abnormal    Troponin T, High Sensitivity 17 (*)    POCT GLUCOSE - Abnormal    POCT Glucose 176 (*)    MANUAL DIFFERENTIAL - SYSMEX WAM - Abnormal    Neutrophils % 60      Lymphocytes % 32      Monocytes % 5      Eosinophils % 0      Basophils % 0      Reactive Lymphocytes % 3      Neutrophils Absolute 2.87      Lymphocytes Absolute 1.53      Monocytes Absolute 0.24 (*)     Eosinophils Absolute 0.00      Basophils Absolute 0.00      Reactive Lymphs Absolute 0.14 (*)     Total WBC Counted 116      RBC Morphology See Indices (*)    PROTIME-INR - Normal    Protime 11.7      INR 1.02     PTT - Normal    aPTT 32.4     CBC WITH DIFFERENTIAL - Normal    WBC 4.8      RBC 4.49      Hemoglobin 13.3      Hematocrit 41.4      MCV 92.2      MCH 29.6      MCHC 32.1      RDW-CV 12.4      RDW-SD 42.1      Platelets 210      MPV 10.4      NRBC % 0.0      NRBC Absolute 0.00     D-DIMER, QUANTITATIVE - Normal    D-DIMER 402.00     NT PRO BNP - Normal    NT-proBNP 83      Narrative:     The 2021 Universal Definition and Classification of Heart Failure states that a diagnosis of heart failure (HF) requires symptoms and/or signs of HF caused by structural and/or functional cardiac abnormalities, accompanied by objective evidence of congestion and/or:     Test             Ambulatory Patients    Hospitalized/Decompensated   NT-proBNP (pg/mL)   >=125                  >=300         Patients with heart failure with preserved ejection fraction (HFpEF) have lower levels of the natriuretic peptides than patients with a similar severity of heart failure with reduced ejection fraction  (HFrEF).     In dyspneic patients in emergency department settings,2 NT-proBNP cut-offs for diagnosis of acute HF were 450,  900, and 1,800 pg/ml for ages <50, 66 to 56, and >75 years.     (1) Bozkurt B et al.  J Cardiac Fail 2021;27:387-413   (2) Madalyn Rob et al. J Am Coll Cardiol 2018;71:1191-1200.    CBC W/DIFF    Narrative:     The following orders were created for panel order CBC and differential.  Procedure                               Abnormality         Status                     ---------                               -----------         ------                     CBC w/ Differential[[id number]]          Normal              Final result               Manual Differential (LAB.Marland KitchenMarland Kitchen[[id number]]  Abnormal            Final result                 Please view results for these tests on the individual orders.   TROPONIN T, HIGH SENSITIVITY       RADIOLOGY  XR CHEST PORTABLE   Final Result   No acute pulmonary pathology.      APPROVED BY STAFF RADIOLOGIST: Bosie Clos, MD 05/07/2023 4:06 PM EST           ED COURSE & MEDICAL DECISION MAKING        Pertinent Labs & Imaging studies reviewed. (See chart for details)  Nursing notes reviewed and agreed with.    ED Course as of 05/10/23 2230   Tue May 07, 2023   1635 Troponin T, High Sensitivity(!): 13  Similar to previous admission, will continue to trend         Diagnoses as of 05/10/23 2230   Chest pain, unspecified type       Medical Decision Making  47 y.o. adult with a PMH of anomalous RCA (status post CABG in 2007, 2012), diabetes, hypertension and reported NSTEMI 2023 who presents to the emergency department via EMS for complaints of chest pain.  On arrival to the emergency department, patient reports his pain is currently a 10/10.  He did receive an aspirin load with EMS prior to arrival.  He reports that he is allergic to nitro and NSAIDs.  He reports that he usually receives Dilaudid for his chest pain.  EKG today with no acute ST changes compared to previous.  He  was given 0.5 mg of IV Dilaudid for pain with improvement.  Serial troponins x 2 today were reassuring and similar to previous labs during admission on 12/7.  Patient was informed of these findings.  He does report continued pain.  Chest x-ray with no acute cardiopulmonary findings.  Patient was counseled that we would like to get a 3-hour troponin today however the patient declined and requested to leave the emergency department AGAINST MEDICAL ADVICE.  His chest pain has improved.  Serial troponins  were reassuring so far.  Patient's history and previous presentations to multiple emergency departments across [place] for similar complaints of chest pain is concerning behavior especially since the patient does not have a current cardiologist.  He reports that he has not seen a cardiologist since 2012. He has had multiple reassuring cardiac workups. The patient understood the risks of leaving AGAINST MEDICAL ADVICE including but not limited to permanent injury or death.  He did sign AMA form.  Patient was given a referral to [facility] cardiology.  He was counseled to follow-up closely with them as well as his PCP for further management and continuance of care.  Strict return precautions given the patient.  The patient ambulated out of the emergency department in stable condition.    Problems Addressed:  Chest pain, unspecified type: acute illness or injury    Amount and/or Complexity of Data Reviewed  External Data Reviewed: labs, radiology, ECG and notes.     Details: Reviewed records as noted in HPI from previous emergency department visits  Labs: ordered. Decision-making details documented in ED Course.  Radiology: ordered.  ECG/medicine tests: ordered.    Risk  Prescription drug management.        CLINICAL IMPRESSION  1. Chest pain, unspecified type         Final Disposition: Left AGAINST MEDICAL ADVICE      Celso Sickle, PA        [place], [place]  05/10/23 2243

## 2023-05-07 NOTE — ED Triage Notes (Addendum)
BIBEMS walking around and started with sudden onset left sided CP radiating to left shoulder and left jaw since 1500. Hx 3 MIs last was in Feb 2023 states this feels similar. 324mg  ASA en route

## 2023-05-07 NOTE — ED Provider Notes (Signed)
[facility] EMERGENCY DEPARTMENT  [address]  [place] [address]         HPI   Chief Complaint   Patient presents with    Chest Pain        HPI  47 year old male with a history of diabetes hypertension and anomalous RCA status post CABG in 2000 72,012, without NSTEMI 2023, was brought by EMS for evaluation of chest pain.  While at rest 30 minutes prior to arrival the patient developed a moderately intense achy left-sided chest pain radiating to the left jaw and shoulder.  There were no other associated signs or symptoms: No shortness of breath palpitation abdominal pain nausea vomiting leg swelling calf pain asymmetry lightheadedness dizziness syncope or near syncope.  He was given aspirin by EMS, presents with persistent and ongoing pain.  He has had multiple recent emergency department visits for similar symptoms.    Social HISTORY: No illicit drug use.  Patient History   Past Medical History:   Diagnosis Date    Coronary artery disease (CMS-HCC)     Diabetes mellitus (Multi-HCC)     Heart attack (Multi-HCC)     x3    Hypertension (CMS-HCC)     pt denies having high BP     Past Surgical History:   Procedure Laterality Date    ARTERIAL BYPASS SURGERY      x2    CHOLECYSTECTOMY      CORONARY ARTERY BYPASS GRAFT      CTA ABDOMEN PELVIS W AND WO CONTRAST  12/03/2022    CTA ABDOMEN PELVIS W AND WO CONTRAST 12/03/2022     Family History   Problem Relation Name Age of Onset    Heart disease Father       Social History     Tobacco Use    Smoking status: Every Day     Current packs/day: 0.50     Types: Cigarettes     Passive exposure: Current    Smokeless tobacco: Never   Vaping Use    Vaping status: Not on file   Substance Use Topics    Alcohol use: Never    Drug use: Never       Review of Systems   Review of Systems  Please refer to the physician assistants or residents notes, or as otherwise documented above in the history of present illness      Physical Exam   ED Triage Vitals   Temp Pulse Resp  BP   05/07/23 1530 05/07/23 1528 05/07/23 1528 05/07/23 1528   36.8 C (98.3 F) 81 22 (!) 165/97      SpO2 Temp Source Heart Rate Source Patient Position   05/07/23 1528 05/07/23 1530 05/07/23 1528 05/07/23 1528   99 % Oral Monitor Sitting      BP Location FiO2 (%)     05/07/23 1528 --     Left arm        Physical Exam  Constitutional: Awake alert uncomfortable.  Eyes without icterus.  ENT: Mucosa is moist.  Neck without jugular venous distention.  Lungs are clear and equal bilateral without rales rhonchi or wheezes no accessory muscle use or increased work of breathing.  Heart regular rate and rhythm without murmur or rub.  Abdomen soft nondistended nontender without guarding peritonitis no organomegaly no pulsatile mass or symptoms are without asymmetry edema or calf tenderness.  Neuro: Awake alert with a GCS of 15.  Skin without rash.    XR CHEST PORTABLE  Final Result   No acute pulmonary pathology.      APPROVED BY STAFF RADIOLOGIST: Bosie Clos, MD 05/07/2023 4:06 PM EST          Labs Reviewed   COMPREHENSIVE METABOLIC PANEL - Abnormal       Result Value    Sodium 140      Potassium 4.1      Chloride 103      CO2 (Bicarbonate) 26      Anion Gap 11      BUN 11      Creatinine 0.83      eGFRcr 110      Glucose 174 (*)     Fasting? Unknown      Calcium 9.9      AST 46 (*)     ALT 67 (*)     Alkaline phosphatase 61      Protein, total 8.2      Albumin 4.3      Bilirubin, total 0.3     TROPONIN T, HIGH SENSITIVITY - Abnormal    Troponin T, High Sensitivity 13 (*)    TROPONIN T, HIGH SENSITIVITY - Abnormal    Troponin T, High Sensitivity 17 (*)    POCT GLUCOSE - Abnormal    POCT Glucose 176 (*)    MANUAL DIFFERENTIAL - SYSMEX WAM - Abnormal    Neutrophils % 60      Lymphocytes % 32      Monocytes % 5      Eosinophils % 0      Basophils % 0      Reactive Lymphocytes % 3      Neutrophils Absolute 2.87      Lymphocytes Absolute 1.53      Monocytes Absolute 0.24 (*)     Eosinophils Absolute 0.00      Basophils  Absolute 0.00      Reactive Lymphs Absolute 0.14 (*)     Total WBC Counted 116      RBC Morphology See Indices (*)    PROTIME-INR - Normal    Protime 11.7      INR 1.02     PTT - Normal    aPTT 32.4     CBC WITH DIFFERENTIAL - Normal    WBC 4.8      RBC 4.49      Hemoglobin 13.3      Hematocrit 41.4      MCV 92.2      MCH 29.6      MCHC 32.1      RDW-CV 12.4      RDW-SD 42.1      Platelets 210      MPV 10.4      NRBC % 0.0      NRBC Absolute 0.00     D-DIMER, QUANTITATIVE - Normal    D-DIMER 402.00     NT PRO BNP - Normal    NT-proBNP 83      Narrative:     The 2021 Universal Definition and Classification of Heart Failure states that a diagnosis of heart failure (HF) requires symptoms and/or signs of HF caused by structural and/or functional cardiac abnormalities, accompanied by objective evidence of congestion and/or:     Test             Ambulatory Patients    Hospitalized/Decompensated   NT-proBNP (pg/mL)   >=125                  >=  300         Patients with heart failure with preserved ejection fraction (HFpEF) have lower levels of the natriuretic peptides than patients with a similar severity of heart failure with reduced ejection fraction (HFrEF).     In dyspneic patients in emergency department settings,2 NT-proBNP cut-offs for diagnosis of acute HF were 450, 900, and 1,800 pg/ml for ages <50, 50 to 41, and >75 years.     (1) Bozkurt B et al.  J Cardiac Fail 2021;27:387-413   (2) Madalyn Rob et al. J Am Coll Cardiol 2018;71:1191-1200.    CBC W/DIFF    Narrative:     The following orders were created for panel order CBC and differential.  Procedure                               Abnormality         Status                     ---------                               -----------         ------                     CBC w/ Differential[[id number]]          Normal              Final result               Manual Differential (LAB.Marland KitchenMarland Kitchen[[id number]]  Abnormal            Final result                 Please view results for these  tests on the individual orders.       Glasgow Coma Scale Score: 15      ED Course & MDM   Diagnoses as of 05/08/23 1602   Chest pain, unspecified type       Medical Decision Making  Problems Addressed:  Chest pain, unspecified type: complicated acute illness or injury    Amount and/or Complexity of Data Reviewed  Independent Historian: EMS  External Data Reviewed: labs, radiology, ECG and notes.  Labs: ordered. Decision-making details documented in ED Course.  Radiology: ordered and independent interpretation performed. Decision-making details documented in ED Course.  ECG/medicine tests: ordered and independent interpretation performed. Decision-making details documented in ED Course.    Risk  Prescription drug management.  Decision regarding hospitalization.  Diagnosis or treatment significantly limited by social determinants of health.        This is an uncomfortable 47 year old who has a history of anomalous right coronary artery, diabetes, hypertension, prior NSTEMI, presents for evaluation of chest pain.  Differential diagnosis includes musculoskeletal or noncardiac chest pain, pulmonary chest pain, less likely pulmonary embolism, acute coronary syndrome, less likely pericarditis.  Finger is visualized and interpreted he has a room and oxygen saturation 96 to 99%.  The cardiac monitor is visualized and interpreted by me as normal sinus rhythm rate 70s without dysrhythmia.  The electrocardiogram is visualized and interpreted by me it is a normal sinus rhythm normal rate without ST elevation acute ischemic change and there are no stigmata of Wellens syndrome, pericarditis, Brugada syndrome.  He is treated with ondansetron, hydromorphone, oxycodone, with modest improvement  in the patient's symptoms.  Chest x-ray is visualized and interpreted me, there is no widening of mediastinum pneumothorax pulmonary edema or pleural effusion.  Labs reveal a modestly elevated initial troponin that is increased by 4 points at  the second value.  We did recommend hospitalization for inpatient cardiac evaluation treatment out of concern for unstable angina or acute coronary syndrome the patient declines.  He would like to leave AGAINST MEDICAL ADVICE.  He is well aware after our conversation acute coronary syndrome could be life-threatening could lead to heart failure permanent disability and sudden or prolonged death he was AGAINST MEDICAL ADVICE.    DIAGNOSIS: Chest pain                    Jannell Franta B. Dewanda Fennema, MD  05/28/23 1214

## 2023-05-07 NOTE — Discharge Instructions (Addendum)
Today you were treated in the Emergency Department and evaluated for chest pain.  Your physical exam and vital signs are reassuring.  Your blood work, EKG and chest x-ray today was reassuring however we did recommend that you receive additional blood tests (troponin) for further evaluation, but you declined and wished to leave against medical advice.          Please return to the ED if you develop any new, changing, worsening, or otherwise concerning symptoms including but not limited to: chest pain or chest discomfort lasts longer than 5 minutes, chest pain or chest discomfort changes or gets worse in any way. You develop any weakness, shortness of breath, palpitations, sweats, dizziness, throwing up or nausea. Your chest pain or chest discomfort moves into your arm, neck, back, jaw or stomach.    We have placed a referral today for [facility] and [facility]. Please call their clinics tomorrow to establish care.

## 2023-05-13 ENCOUNTER — Ambulatory Visit: Payer: MEDICAID

## 2023-05-13 NOTE — Progress Notes (Deleted)
[facility]  [address]  [address] [place], [place] [address]  TEL: [phone]      Patient:  [name]   DOB:  [birth year]   MRN:     [id number] Age:   47 y.o.   Encounter Date: 05/13/2023 Sex:    adult     Associated Providers: PCP: Tacy Learn, MD     REASON FOR REFERRAL:       HISTORY OF PRESENT ILLNESS:   We had the pleasure of seeing   NATHANEAL ANZELMO in the [employer] at [facility] on 05/13/2023. As you may know, YOSVANI ALLARD is a 47 y.o. adult with a history of .   MACKINNON GENOVA presents to our clinic for the evaluation of.       Review of Systems:   Pertinent positive and negative as per HPI, otherwise a 10-point review of systems was negative.  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)    HLD (hyperlipidemia) (CMS-HCC)    Essential hypertension (CMS-HCC)    Congenital anomaly of artery (HHS-HCC)       PAST SURGICAL HISTORY:     Past Surgical History:   Procedure Laterality Date    ARTERIAL BYPASS SURGERY      x2    CHOLECYSTECTOMY      CORONARY ARTERY BYPASS GRAFT      CTA ABDOMEN PELVIS W AND WO CONTRAST  12/03/2022    CTA ABDOMEN PELVIS W AND WO CONTRAST 12/03/2022        FAMILY HISTORY:     Family History   Problem Relation Name Age of Onset    Heart disease Father         SOCIAL HISTORY:     Social History     Tobacco Use    Smoking status: Every Day     Current packs/day: 0.50     Types: Cigarettes     Passive exposure: Current    Smokeless tobacco: Never   Substance Use Topics    Alcohol use:  Never    Drug use: Never      Lives in ***, with ***.   Smoking: ***  Alcohol: ***  Drugs: ***    ALLERGIES:     Allergies   Allergen Reactions    Ibuprofen Anaphylaxis, Hives, Itching and Rash     Other reaction(s): Anaphylactic reaction, Unknown, Urticaria  anaphylaxis  anaphylaxis      Ketorolac Anaphylaxis, Hives and Rash     Other reaction(s): ITCHY HIVES, Unknown, Urticaria  Converted from Generic Allergy: Ketorolac  Anaphylaxis   Anaphylaxis   Tolerates ASA      Nitroglycerin      hives    Tramadol Anaphylaxis, Angioedema, Hives and Rash     Other reaction(s): THROAT CLOSES, Unknown, Urticaria  Patient confirmed that  he can take Percocet, Hydromorphone and Morphine. [name], Pharmacy  anaphylaxis  anaphylaxis      Codeine        VITALS AND PHYSICAL EXAM:   Vitals:   There were no vitals taken for this visit.         Physical Examination:  Constitutional: No acute distress, well appearing and well nourished. Appears alert, appears stated age and cooperative. Appears {TXP desc; cachectic, normal, overweight:32247}.   Eyes: Pupils equal and reactive, extra ocular eye movements intact, left eye normal, right eye normal.  Head: Normocephalic, without obvious abnormality, atraumatic  Neck: Supple, symmetric, trachea midline, no masses. The thyroid appears normal, no thyromegaly. The jugular venous pressure is {TXP desc; normal, elevated, low:32250} at *** cm H20. There are no carotid bruits, normal upstroke of bilateral carotid arteries.  Respiratory: Lungs are clear to auscultation. No wheezing, rales or rhonchi.   Cardiovascular: {TXP desc; nondisplaced, laterally displaced, heaving:32258} PMI. {TXP desc; regular, irregular:32028} rhythm, {TXP desc; normal, tachycardic, bradycardic:32369} rate. S1, S2 are normal. No murmur or rub.   Lower Extremities: 4+ dorsalis pedis, posterior tibial, femoral and popliteal pulses bilaterally with no evidence of any abdominal bruits. Eddrick had no cyanosis or clubbing.  {TXP desc; no, trace, 1+, 2+, 3+:32385} {TXP desc; bilateral, left-sided, right-sided:32424} lower extremity edema.  Gastrointestinal: Normal bowel sounds. Non-tender, no masses. No hepatomegaly or splenomegaly.   Musculoskeletal: No joint tenderness, deformity or swelling.  Skin: Normal coloration and turgor, no rashes, no suspicious skin lesions noted.  Neurologic: No focal deficits, alert and oriented x3, normal strength and tone. Normal symmetric reflexes. Normal coordination and gait.  Lymphatic: No palpable lymphadenopathy.  Psychiatric: Appears in good mood, fluent in speech and cognition.    DATA Reviewed:   Cardiology Studies:    Encounter Date: 05/07/23   ECG 12 lead    Narrative                                       Fellow B                                                                                  Test Date:    2023-05-07  [facility] Name:     [facility]          Department:   N1-ED  Patient ID:   [id number]                 Room:           Gender:       Male                     Technician:   TL  DOB:          1976-01-19               Requested By: Konrad Penta  Order Number: [id number]                Reading MD:   Dory Peru  Measurements  Intervals                              Axis            Rate:         79                       P:            44  PR:           165                      QRS:          43  QRSD:         98                       T:            80  QT:           363                                      QTc:          417                                                                 Interpretive Statements  Sinus rhythm  Left atrial enlargement  Compared to ECG 04/28/2023 10:46:56  no significant change  Electronically Signed On 05-08-2023 13:31:33 EST by Fellow Laray Anger  Electronically Signed On 05-13-2023 01:29:40 EST by Dory Peru      No echocardiogram results found for the past 12 months  No nuclear medicine results found for the past  12 months   RHC: ***  LHC: ***  Cardiac MRI: ***  Telemetry ***   Device Interrogation (ICD/PPM): ***  ZioPatch: ***  Ambulatory Monitor: ***    Laboratory Studies:  Lab Results   Component Value Date    NA 140 05/07/2023    K 4.1 05/07/2023    CL 103 05/07/2023    CO2 26 05/07/2023    BUN 11 05/07/2023    CREATININE 0.83 05/07/2023    GLUCOSE 174 (H) 05/07/2023    CALCIUM 9.9 05/07/2023    HGBA1C 10.6 (H) 01/21/2022    TROPONINI 0.01 08/19/2021     Lab Results   Component Value Date    CHOL 150 02/01/2023    CHOL 211 (H) 07/04/2022    CHOL 205 (H) 03/29/2022     Lab Results   Component Value Date    HDL 28 (L) 02/01/2023    HDL 31 07/04/2022    HDL 29 (L) 03/29/2022     No results found for: "LDLCALC"  Lab Results   Component Value Date    TRIG 86 02/01/2023    TRIG 193 (H) 07/04/2022    TRIG 205 (H) 03/29/2022       HOME MEDICATIONS:     Current Outpatient Medications   Medication Instructions    aspirin 81 mg, oral, Daily  RT    atorvastatin (LIPITOR) 40 mg, oral, Daily RT    CLINDAMYCIN HCL ORAL oral    insulin glargine (LANTUS) 15 Units, subcutaneous, Nightly    metoprolol succinate XL (TOPROL-XL) 25 mg, oral, 2 times daily    metoprolol tartrate (LOPRESSOR) 25 mg, oral, 2 times daily    Ventolin HFA 90 mcg/actuation inhaler 2 puffs, inhalation, Every 6 hours PRN        ASSESSMENT & PLAN:   It was a pleasure seeing Jaxton Hunsaker in consultation at the [facility] today. The patient is a very pleasant 47 y.o. adult with a past medical history of *** who presents to clinic for ***.    Assessment/Plan          This patient was staffed and discussed with Attending Cardiologist Dr. Coral Else    Thank you for referring [name] to the clinic. I hope our thoughts are in keeping with yours. Please do not hesitate to contact us if there are any questions, concerns, or clinical updates.    Follow-up: No follow-ups on file. Ronald Pippins, MD  Fellow, [facility]

## 2023-05-14 LAB — PROTHROMBIN TIME CARE EVERYWHERE
INR CARE EVERYWHERE: 1 (ref 0.8–1.2)
PROTHROMBIN TIME CARE EVERYWHERE: 12.7 s (ref 10.0–13.0)

## 2023-05-14 LAB — APTT CARE EVERYWHERE: APTT CARE EVERYWHERE: 33 s (ref 24.0–37.0)

## 2023-05-15 LAB — HEMOGLOBIN A1C CARE EVERYWHERE: HEMOGLOBIN A1C CARE EVERYWHERE: 8.1 % — ABNORMAL HIGH (ref 4.3–5.6)

## 2023-05-17 LAB — BMP (EXT)
Anion Gap (EXT): 12 (ref 5–15)
BUN (EXT): 11 mg/dL (ref 7–23)
CO2 (EXT): 25 mmol/L (ref 22–32)
CalciumCalcium (EXT): 9.8 mg/dL (ref 8.6–10.5)
Chloride (EXT): 103 mmol/L (ref 98–107)
Creatinine (EXT): 0.75 mg/dL (ref 0.60–1.30)
Glucose (EXT): 128 mg/dL — ABNORMAL HIGH (ref 65–99)
Potassium (EXT): 3.8 mmol/L (ref 3.5–5.3)
Sodium (EXT): 140 mmol/L (ref 135–145)
eGFR - Creat CKD-EPI (EXT): >90 mL/min/{1.73_m2} (ref >=60)

## 2023-05-17 LAB — LIPID PROFILE (EXT)
Chol/HDL Ratio (EXT): 5.5 — ABNORMAL HIGH (ref <5.0)
Cholesterol (EXT): 181 mg/dL (ref <=199)
HDL Cholesterol (EXT): 33 mg/dL — ABNORMAL LOW (ref 40–59)
LDL Cholesterol (EXT): 127 mg/dL — ABNORMAL HIGH (ref <100)
NON HDL Cholesterol (EXT): 148 mg/dL
Triglycerides (EXT): 106 mg/dL (ref <=149)
VLDL Cholesterol (EXT): 21.2 mg/dL

## 2023-05-17 LAB — COVID-19 CARE EVERYWHERE: COVID-19 CARE EVERYWHERE: NOT DETECTED

## 2023-05-23 LAB — HEMOGLOBIN A1C
Estimated Average Glucose mg/dL (INT/EXT): 189 mg/dL
HEMOGLOBIN A1C % (INT/EXT): 8.2 % — ABNORMAL HIGH (ref <6.5)

## 2023-05-24 NOTE — Telephone Encounter (Signed)
Formatting of this note might be different from the original.  Left a message for English, letting him know that he is still assigned to [facility], and I would be happy to make and in person appointment for him, as requested by his PCP.  If he has moved out of the area, to please call [facility] and they will assing him to a provider closer to where he is residing.  Electronically signed by Phil Dopp, RN at 05/24/2023 11:04 AM EST

## 2023-05-27 LAB — UNMAPPED LAB RESULTS: Phosphorous (EXT): 2.7 mg/dL (ref 2.7–4.5)

## 2023-05-27 LAB — PROTHROMBIN TIME CARE EVERYWHERE
INR CARE EVERYWHERE: 1 (ref 0.9–1.1)
PROTHROMBIN TIME CARE EVERYWHERE: 11.6 s (ref 9.4–12.5)

## 2023-05-27 LAB — APTT CARE EVERYWHERE: APTT CARE EVERYWHERE: 30 s (ref 25–36)

## 2023-06-02 LAB — COVID-19 AND INFLUENZA CARE EVERYWHERE
COVID-19 CARE EVERYWHERE: NOT DETECTED
INFLUENZA A CARE EVERYWHERE: NOT DETECTED
INFLUENZA B CARE EVERYWHERE: NOT DETECTED

## 2023-06-02 LAB — HEMOGLOBIN A1C CARE EVERYWHERE
ESTIMATED AVERAGE GLUCOSE CARE EVERYWHERE: 185 mg/dL
HEMOGLOBIN A1C CARE EVERYWHERE: 8.1 % — ABNORMAL HIGH (ref 4.6–5.6)

## 2023-06-08 LAB — PROTHROMBIN TIME CARE EVERYWHERE
INR CARE EVERYWHERE: 1 (ref 0.8–1.2)
PROTHROMBIN TIME CARE EVERYWHERE: 11.4 s (ref 10.0–13.0)

## 2023-06-13 NOTE — ED Provider Notes (Signed)
-------------------------------------------------------------------------------  Attestation signed by Nolon Bussing. Wyline Mood, MD at 06/16/2023  4:51 AM  Attending to Resident/Fellow - I saw and evaluated the patient. I discussed the case with the resident(s)/fellow(s) and agree with the findings and plan as documented by the resident(s)/fellow(s). Key elements, clarifications and/or exceptions are noted by me.    Dustin Carney DOB: 04-Nov-1975 MRN: [id number] CSN: [id number]   -------------------------------------------------------------------------------    History     HPI:     Chief Complaint   Patient presents with   . Chest Pain       HPI    Dustin Carney is a 48 y.o. male with past medical history of hypertension, hyperlipidemia, type 2 DM, chronic CAD with MI x 3, CABG x2, prior cardiac arrest, former smoker, MI, narcotic abuse, and non-adherence to medical treatment. who presents with onset of left sided chest pain which radiates to the jaw and down the left arm.  He has had multiple hospital visits in the past month which he stated were for knee pain and for chest pain.  He states that the chest pain spontaneously resolved during these previous visits.  He currently rates his chest pain 8/10 in intensity.  States that he felt well prior to the onset of the chest pain today and denies any pedal edema, orthopnea, or PND.     Patient History     Past Medical History:   Diagnosis Date   . CAD (coronary artery disease)    . DM (diabetes mellitus) (HCC)    . HTN (hypertension)    . Hyperlipidemia    . Myocardial infarction [facility])        Past Surgical History:   Procedure Laterality Date   . CHOLECYSTECTOMY     . CORONARY ARTERY BYPASS GRAFT  2003    2003 and 2012 RCA       Family History   Problem Relation Age of Onset   . Heart disease Mother    . Heart disease Father    . Heart disease Brother        Social History     Tobacco Use   . Smoking status: Former     Current packs/day: 0.25     Average packs/day: 0.3  packs/day for 30.0 years (7.5 ttl pk-yrs)     Types: Cigarettes   . Smokeless tobacco: Never   Vaping Use   . Vaping status: Never Used   Substance Use Topics   . Alcohol use: No   . Drug use: No       Vaping       Questions Responses    Vaping Use Never User            Sexuality and Gender Identity    Sexuality     Legal Information  Legal first name: Dustin  Legal last name: Carney  Legal sex: Male     Gender Identity     Patient's sex assigned at birth: Male              Organ Inventory    Organs the patient currently has: Organs present at birth or expected at birth to develop: Organs surgically enhanced or constructed: Organs hormonally enhanced or developed:   breasts       cervix       ovaries       uterus       vagina       penis       prostate  testes                Physical Exam     Physical Exam     ED Triage Vitals [06/13/23 2310]   Temp Heart Rate Resp BP SpO2   36.9 C (98.4 F) 77 17 131/75 96 %      Temp Source Heart Rate Source Patient Position BP Location Set FiO2 (O2%)   Oral -- -- -- --       Physical Exam  Vitals and nursing note reviewed.   Constitutional:       Appearance: He is well-developed.   HENT:      Head: Normocephalic and atraumatic.   Eyes:      Conjunctiva/sclera: Conjunctivae normal.   Cardiovascular:      Rate and Rhythm: Normal rate and regular rhythm.      Heart sounds: No murmur heard.     Comments: Sternotomy scar  Pulmonary:      Effort: Pulmonary effort is normal. No respiratory distress.      Breath sounds: Normal breath sounds.   Abdominal:      Palpations: Abdomen is soft.      Tenderness: There is no abdominal tenderness.   Musculoskeletal:      Cervical back: Neck supple.      Right lower leg: No edema.      Left lower leg: No edema.   Skin:     General: Skin is warm and dry.   Neurological:      Mental Status: He is alert.              Medical Decision Making and ED Course       Assessment and Plan:  48 year old male with extensive cardiac history presents with  chest pain as well as elevated delta Trope.  Bedside ultrasound shows visually decreased EF, with a maps He of 10.34.  There is some faint T wave inversions in lead I with no other new ischemic changes on the EKG.  Patient treated with fentanyl and morphine for pain.  He reports an anaphylactic reaction to nitro.  Cardiology consulted in the care of this patient.  Per high-sensitivity troponin algorithm he meets criteria for admission.  His heart score at this time is 7 which puts him in the high risk category.  Care of this patient transitioned to my colleague pending final cardiology recommendations.  At this point they do not recommend starting heparin, and will give new recommendations after the 3-hour troponin, or if his symptoms change clinically.    ____________________________________________________________________________  Amount and/or Complexity of Data Reviewed:  Type of data reviewed: external lab data reviewed, external radiology data reviewed, external ECG data reviewed  Type of external notes reviewed: prior ED visit and hospitalization  Discussion with other providers:   Care was discussed with the following providers: CONSULTANT (to review case and request evaluation).    Current encounter data review: The following tests from this encounter were independently reviewed: ECG and POCUS      ED Course as of 06/14/23 0153   Thu Jun 13, 2023   2342 MAPSI 10.34. [place] machine not uploading images [JC]   Fri Jun 14, 2023   0038 EKG today shows a rate of 75 bpm, normal sinus rhythm.  P wave biphasic in V1 and V2,  apart from T wave inversion in lead I no T wave inversions or ST changes of concern.  Compared to May 16, 2023, T wave inversion in lead I  was not present previously no other ST wave or T wave changes concerning for ischemia.  Normal sinus rhythm at a rate of 75 bpm.  This EKG interpretation was performed by myself in the absence of a cardiologist. [MW]      ED Course User Index  [JC] Enis Slipper, DO  [MW] Nolon Bussing. Wyline Mood, MD       Dustin Carney DOB: [birth year] MRN: [id number] CSN: [id number]                  Enis Slipper, DO  Resident  06/14/23 [phone]

## 2023-06-14 LAB — BMP (EXT)
Anion Gap (EXT): 10 (ref 5–15)
BUN (EXT): 21 mg/dL (ref 7–23)
CO2 (EXT): 24 mmol/L (ref 22–32)
CalciumCalcium (EXT): 9.2 mg/dL (ref 8.6–10.5)
Chloride (EXT): 106 mmol/L (ref 98–107)
Creatinine (EXT): 0.91 mg/dL (ref 0.60–1.30)
Glucose (EXT): 163 mg/dL — ABNORMAL HIGH (ref 65–99)
Potassium (EXT): 4.1 mmol/L (ref 3.5–5.3)
Sodium (EXT): 140 mmol/L (ref 135–145)
eGFR - Creat CKD-EPI (EXT): >90 mL/min/{1.73_m2} (ref >=60)

## 2023-06-14 NOTE — H&P (Signed)
-------------------------------------------------------------------------------  Attestation signed by Jerelene Redden, MD at 06/16/2023 10:36 PM  I saw and evaluated the patient. Case discussed with the resident/fellow and I agree with the findings and plan as documented in the resident's/fellow's note.    -------------------------------------------------------------------------------       History and Physical                                                                                  CHIEF COMPLAINT: chest pain  HISTORY OF PRESENT ILLNESS: History obtained from: Patient and Medical Record     Subjective    48 year old male with significant past cardiac medical history with multiple myocardial infarction in the past with 2 CABGs, type 2 diabetes on insulin, hypertension, hyperlipidemia presenting to the hospital for evaluation of chest pain.    On review of records, patient states that the chest pain has been ongoing for multiple weeks without any clear triggers.  Vital signs are notable for being afebrile with remainder normal.  Diagnostic of lab workup pursued with serum labs which were normal electrolytes, creatinine 0.91, initial troponin of 14 peaking at 23 and final troponin of 14.  Cell lines were normal without any leukocytosis with mild anemia.  EKG was obtained which does demonstrate some T wave inversions in leads I, aVL but otherwise without any overt ST elevations or depressions.  Of note patient does have a anaphylactic allergy to nitroglycerin, he was not given any more resolution of his chest pain.  While in the emergency department, he has been given IV fentanyl, morphine for pain resolution.  Chest x-ray was also obtained which showed history of cardiac surgery interventions but no radiographic evidence of any acute cardiopulmonary process.  Patient was admitted to the cardiology service for further management.     On my bedside evaluation, patient endorses having intermittent presence of  substernal chest pain without any clear triggers.  He is a poor historian in terms of describing the triggers and frequency and duration of the chest pain but says for the past 2 months, they have increased in frequency and are not associated with certain positions, activities.  They appear to be quite sporadic.  Some chest pains last for minutes to hours which he deals with at home and other ones are persistent for which she comes to the hospital for further evaluation.  He status post 2 CABGs with 1 done in 2007 at [place] with a second one performed in 2012 in [place] [facility].  Since that time, has had further ischemic evaluation most recently with a nuclear med stress test performed at [facility] in January 2025 demonstrating normal left ventricular perfusion study with preserved ejection fraction.    During our conversation, he continued to endorse having poor pain management and in the outpatient setting gets Percocet.  He endorsed that he would need IV pain medications while in the hospital but is open to doing oral pain medications with IV breakthrough.  Denies any fevers, chills, positional chest pain.       REVIEW OF SYSTEMS:     Per HPI    Past Medical History:    CAD (coronary artery disease)  DM (diabetes mellitus) (HCC)  HTN (hypertension)  Hyperlipidemia  Myocardial infarction [facility])     Past Surgical History:    CHOLECYSTECTOMY  CORONARY ARTERY BYPASS GRAFT    Home Medications:  No current outpatient medications on file.    Allergies:  Coconut  Nitroglycerin  Tramadol Hcl  Ketorolac Tromethamine  Codeine  Motrin [Ibuprofen]  Toradol [Ketorolac]  Tramadol    Social History:  Tobacco Use   . Smoking status: Former     Current packs/day: 0.25     Average packs/day: 0.3 packs/day for 30.0 years (7.5 ttl pk-yrs)     Types: Cigarettes   . Smokeless tobacco: Never   Vaping Use   . Vaping status: Never Used   Substance Use Topics   . Alcohol use: No   . Drug use: No     Family History:      Problem Relation Age of Onset    . Heart disease Brother      Father      Mother         ObjectiveTemp:  [36.4 C (97.5 F)-36.9 C (98.4 F)] 36.4 C (97.5 F)  Heart Rate:  [64-77] 72  Resp:  [11-17] 16  BP: (117-131)/(64-86) 125/80  SpO2:  [94 %-98 %] 98 %         Physical Exam    GENERAL: No apparent distress;  HEENT: Pupils equal, round, and react to light; Extraocular movements intact; Conjunctivae non-ictereric; Neck supple; moist mucous membranes  RESPIRATORY: Good air movement. Clear to auscultation, no wheezing, rales or rhonchi  CARDIOVASCULAR: Regular rate rhythm; No murmurs, rubs, gallops  ABDOMINAL: Normoactive bowel sounds; nontender, no masses, not distended, no rebound, no guarding  GU: No CVA tenderness   MUSCULOSKELETAL: No clubbing or cyanosis; No joint swelling, tenderness, effusions; No pedal edema  SKIN: Well healed scar in the center of the sternum  NEUROLOGIC: Alert oriented x 3; No gross deficits  PSYCH: Normal mood and affect      Result Review <redacted file path>  I have reviewed the patient's labs results from the last 24 hours.    Imaging Results <redacted file path>  Radiology Review: I have personally reviewed the patient's imaging results.     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  Most recent ischemic workup performed with nuclear medicine stress test at [facility] demonstrating  normal pharmacologic myocardial perfusion.  Unfortunately he is not able to tolerate nitrates due to angioedema and visits to multiple healthcare systems.  At this time, will admit for further ischemic evaluation for consideration of cardiac catheterization versus medication titration with other antianginal agents.  Also obtain studies from outside hospitals to understand implanted vessel anatomy.    Plan:  -Aspirin 81 mg daily  -Continue atorvastatin 80 mg daily  -Continue metoprolol  tartrate 50 mg twice daily, consider uptitrating as tolerated  -Obtain records from outside facility to coordinate care  -Consider other antianginal agents like ranolazine  DM (diabetes mellitus) (HCC)  Home medications: Glargine 10 units in the morning    Patient with a history of poorly controlled diabetes mellitus with last A1c on file at 8.1%.  Not currently on any oral antiglycemics.     Plan:  -Glargine 5 units in the morning, uptitrate as tolerated  -Low-dose sliding scale with meals    Chronic pain     Patient with longstanding endorsement of pain specifically and the chest for which she has had an ischemic workup performed.  In the outpatient setting, he has been given prescriptions for oxycodone with acetaminophen 5 mg every 6 hours as needed.  Will try pain control with cardiac meds but for persistent pain control, may consider either buprenorphine versus methadone.    Plan:  - oxycodone 5 mg q6h as needed  - IV morphine 2 mg q8h prn for breakthrough  - Consider initiation of buprenorphine vs. Methadone           GLOBAL PLAN OF CARE:  FLUIDS AND NUTRITION  FLUIDS:   NUTRITION: Diet, Adult Full participation; Cardiac; 50 gm Fat; 2,300 mg Na; IDDSI Level 0: Thin; IDDSI Level 7: Regular    VTE PROPHYLAXIS  Pharmacologic Prophylaxis: lovenox  Mechanical Prophylaxis: N/A    ADVANCED CARE PLANNING  Code Status: Full Code     Medical Decision Maker:      I have discussed the plan of care with: Patient and Attending.      Med  Reconciliation Status: Medication Reconciliation Status: COMPLETE: Medication reconciliation is complete in full using one or more reliable source(s).          Signature:  Richardson Chiquito, MD PGY-3       Electronic Signature

## 2023-06-18 DIAGNOSIS — R072 Precordial pain: Secondary | ICD-10-CM

## 2023-06-18 MED ORDER — aspirin chewable tablet 324 mg
81 | Freq: Once | ORAL | Status: AC
Start: 2023-06-18 — End: 2023-06-19
  Administered 2023-06-19: 08:00:00 324 mg via ORAL

## 2023-06-18 NOTE — Unmapped (Signed)
Patient: BRENDA COWHER   MRN: [id number]   Date of service: 06/19/2023   PCP: No PCP, Per Patient     EMERGENCY DEPARTMENT ENCOUNTER    CHIEF COMPLAINT       Chief Complaint   Patient presents with    Chest Pain       HPI     CHANE COWDEN is a 48 y.o. adult with a PMH of   Past Medical History:   Diagnosis Date    Coronary artery disease     Diabetes mellitus (Multi-HCC)     Heart attack (Multi-HCC)     x3    Hypertension     pt denies having high BP    who presents to the ED for evaluation of chest pain.  Patient reports symptoms started half an hour prior to arrival to the emergency department.  He reports he was sleeping at the time.  Reports left-sided chest squeezing pain that goes up to his neck and down his arm.  Per patient's record he has been seen multiple times this month with similar complaints and emergency departments around [place] and with other states in the area.  He reports he has a new cardiologist through [facility] who he will see in February.        PAST MEDICAL HISTORY         Past Medical History:   Diagnosis Date    Coronary artery disease     Diabetes mellitus (Multi-HCC)     Heart attack (Multi-HCC)     x3    Hypertension     pt denies having high BP       CURRENT MEDICATIONS       Previous Medications    ASPIRIN 81 MG CAPSULE    Take 81 mg by mouth in the morning.    ATORVASTATIN (LIPITOR) 10 MG TABLET    Take 40 mg by mouth in the morning.    CLINDAMYCIN HCL ORAL    Take by mouth.    INSULIN GLARGINE (LANTUS) 100 UNIT/ML (3 ML) INJECTION    Inject 15 Units under the skin at bedtime.    METOPROLOL SUCCINATE XL (TOPROL-XL) 50 MG 24 HR TABLET    Take 25 mg by mouth twice daily.    METOPROLOL TARTRATE (LOPRESSOR) 25 MG TABLET    Take 25 mg by mouth in the morning and 25 mg in the evening.    VENTOLIN HFA 90 MCG/ACTUATION INHALER    Inhale 2 puffs every 6 (six) hours if needed.         FAMILY HISTORY         Family History   Problem Relation Name Age of Onset    Heart disease Father          SURGICAL HISTORY         Past Surgical History:   Procedure Laterality Date    ARTERIAL BYPASS SURGERY      x2    CHOLECYSTECTOMY      CORONARY ARTERY BYPASS GRAFT      CTA ABDOMEN PELVIS W AND WO CONTRAST  12/03/2022    CTA ABDOMEN PELVIS W AND WO CONTRAST 12/03/2022       ALLERGIES         Allergies   Allergen Reactions    Ibuprofen Anaphylaxis, Hives, Itching and Rash     Other reaction(s): Anaphylactic reaction, Unknown, Urticaria  anaphylaxis  anaphylaxis      Ketorolac Anaphylaxis, Hives and Rash  Other reaction(s): ITCHY HIVES, Unknown, Urticaria  Converted from Generic Allergy: Ketorolac  Anaphylaxis   Anaphylaxis   Tolerates ASA      Nitroglycerin      hives    Tramadol Anaphylaxis, Angioedema, Hives and Rash     Other reaction(s): THROAT CLOSES, Unknown, Urticaria  Patient confirmed that he can take Percocet, Hydromorphone and Morphine. [name], Pharmacy  anaphylaxis  anaphylaxis      Codeine        SOCIAL HISTORY        Social History     Socioeconomic History    Marital status: Single     Spouse name: Not on file    Number of children: Not on file    Years of education: Not on file    Highest education level: Not on file   Occupational History    Not on file   Tobacco Use    Smoking status: Every Day     Current packs/day: 0.50     Types: Cigarettes     Passive exposure: Current    Smokeless tobacco: Never   Vaping Use    Vaping status: Some Days    Substances: Nicotine   Substance and Sexual Activity    Alcohol use: Never    Drug use: Never    Sexual activity: Defer   Other Topics Concern    Not on file   Social History Narrative    Not on file     Social Determinants of Health     Financial Resource Strain: Medium Risk (06/01/2023)    Received from [facility]    Overall Financial Resource Strain (CARDIA)     Difficulty of Paying Living Expenses: Somewhat hard   Food Insecurity: Food Insecurity Present (06/01/2023)    Received from [facility]    Hunger Vital Sign      Worried About Running Out of Food in the Last Year: Often true     Ran Out of Food in the Last Year: Not on file   Transportation Needs: Unmet Transportation Needs (06/02/2023)    Received from [facility]    PRAPARE - Transportation     Lack of Transportation (Medical): Yes     Lack of Transportation (Non-Medical): Yes   Physical Activity: Not on file   Stress: No Stress Concern Present (05/14/2023)    Received from [facility] - Occupational Stress Questionnaire     Feeling of Stress : Only a little   Social Connections: Unknown (07/30/2019)    [facility] from [facility]    Social Connections     11. How often do you see or talk to people that you care about and feel close to? (Talking to friends on the phone or virtually, visiting friends/family, religious service, clubs): Not on file   Intimate Partner Violence: Unknown (06/01/2023)    Received from [facility]    Humiliation, Afraid, Rape, and Kick questionnaire     Fear of Current or Ex-Partner: No     Emotionally Abused: Not on file     Physically Abused: Not on file     Sexually Abused: Not on file   Housing Stability: High Risk (06/02/2023)    Received from [facility]    Housing Stability Vital Sign     Unable to Pay for Housing in the Last Year: Yes  Number of Times Moved in the Last Year: Not on file     Homeless in the Last Year: Yes       PHYSICAL EXAM        VITAL SIGNS:    ED Triage Vitals   Temp Pulse Resp BP   06/18/23 2149 06/18/23 2147 06/18/23 2147 06/18/23 2147   36.9 C (98.4 F) 86 18 (!) 148/88      SpO2 Temp Source Heart Rate Source Patient Position   06/18/23 2147 06/18/23 2149 06/19/23 0313 06/19/23 0613   97 % Oral Monitor Semi-Fowler      BP Location FiO2 (%)     06/19/23 0613 --     Left arm           General: The patient appears awake, alert, non-toxic, and in no acute distress.  Head: Normocephalic, atraumatic.    Eyes: PERRL.  ENT: Moist mucus membranes.  Neck: Supple, trachea midline.  Respiratory: CTAB, non-labored breathing, symmetrical chest expansion, no respiratory distress.  CVS: Regular rate and rhythm, no peripheral edema.   GI: Soft, non-tender. Normoactive bowel sounds in all quadrants. No distention, masses or guarding.   Musculoskeletal: No deformities full ROM  Neuro: GCS15, AOx4, cranial nerves II-XII grossly intact, mentation is appropriate for age, moving all 4 extremities, steady gait.  Psych: Behavior is cooperative and pleasant. Affect is calm.  Skin: Warm, dry and intact. Normal capillary refill.       RESULTS        LABS  Labs Reviewed   COMPREHENSIVE METABOLIC PANEL - Abnormal       Result Value    Sodium 140      Potassium 3.8      Chloride 105      CO2 (Bicarbonate) 24      Anion Gap 11      BUN 13      Creatinine 0.92      eGFRcr 105      Glucose 143 (*)     Fasting? Unknown      Calcium 9.8      AST 33      ALT 43      Alkaline phosphatase 56      Protein, total 7.6      Albumin 4.0      Bilirubin, total 0.2     TROPONIN T, HIGH SENSITIVITY - Abnormal    Troponin T, High Sensitivity 12 (*)    CBC WITH DIFFERENTIAL - Abnormal    WBC 5.9      RBC 4.24      Hemoglobin 12.6 (*)     Hematocrit 38.0      MCV 89.6      MCH 29.7      MCHC 33.2      RDW-CV 12.5      RDW-SD 40.9      Platelets 264      MPV 9.5      NRBC % 0.0      NRBC Absolute 0.00     POCT GLUCOSE - Abnormal    POCT Glucose 285 (*)    MANUAL DIFFERENTIAL - SYSMEX WAM - Abnormal    Neutrophils % 49      Lymphocytes % 40      Monocytes % 5      Eosinophils % 2      Basophils % 0      Reactive Lymphocytes % 4      Neutrophils Absolute 2.89  Lymphocytes Absolute 2.36      Monocytes Absolute 0.29 (*)     Eosinophils Absolute 0.12      Basophils Absolute 0.00      Reactive Lymphs Absolute 0.24 (*)     Total WBC Counted 113      RBC Morphology See Indices (*)    TROPONIN T, HIGH SENSITIVITY - Normal    Troponin T, High Sensitivity 9     NT  PRO BNP - Normal    NT-proBNP 42      Narrative:     The 2021 Universal Definition and Classification of Heart Failure states that a diagnosis of heart failure (HF) requires symptoms and/or signs of HF caused by structural and/or functional cardiac abnormalities, accompanied by objective evidence of congestion and/or:     Test             Ambulatory Patients    Hospitalized/Decompensated   NT-proBNP (pg/mL)   >=125                  >=300         Patients with heart failure with preserved ejection fraction (HFpEF) have lower levels of the natriuretic peptides than patients with a similar severity of heart failure with reduced ejection fraction (HFrEF).     In dyspneic patients in emergency department settings,2 NT-proBNP cut-offs for diagnosis of acute HF were 450, 900, and 1,800 pg/ml for ages <50, 50 to 31, and >75 years.     (1) Bozkurt B et al.  J Cardiac Fail 2021;27:387-413   (2) Madalyn Rob et al. J Am Coll Cardiol 2018;71:1191-1200.    LIPASE - Normal    Lipase 33     SDS, SERUM DRUG SCREEN - Normal    Acetaminophen level <5      Ethanol level, plasma/serum <10      Salicylate level <0.5      Benzodiazepines screen, serum Not Detected      Tricyclics screen, serum Not Detected     TROPONIN T, HIGH SENSITIVITY - Normal    Troponin T, High Sensitivity 11     CBC W/DIFF    Narrative:     The following orders were created for panel order CBC and differential.  Procedure                               Abnormality         Status                     ---------                               -----------         ------                     CBC w/ Differential[[id number]]          Abnormal            Final result               Manual Differential (LAB.Marland KitchenMarland Kitchen[[id number]]  Abnormal            Final result                 Please view results for these tests on the individual orders.       RADIOLOGY  XR CHEST 2 VIEWS   Preliminary Result      No radiographic evidence of acute pulmonary process.      DICTATED: Stanford Scotland, MD    IN ACCORDANCE WITH DEPARTMENT POLICY, TEACHING PHYSICIANS REVIEW ALL IMAGES, AND EDIT REPORTS AS REQUIRED. A REPORT IS NOT FINAL UNTIL APPROVED BY A STAFF RADIOLOGIST.      CTA CHEST W AND WO CONTRAST    (Results Pending)            ED COURSE & MEDICAL DECISION MAKING        Pertinent Labs & Imaging studies reviewed. (See chart for details)    ED Course as of 06/19/23 1906   Wed Jun 19, 2023   0227 Troponin T, High Sensitivity(!): 12         Diagnoses as of 06/19/23 1906   Chest pain, unspecified type       MDM  48 year old male presents for evaluation of chest pain.  Patient was afebrile vital signs are stable.  Patient's labs did note a elevated troponin of 12 however serial troponins  No significant change.  He was given 2 doses of 0.5 mg of Dilaudid.  He underwent CTA of his chest to rule out a aortic dissection.  Signout to my colleague at 7 AM pending results of CTA and final disposition    CLINICAL IMPRESSION  1. Chest pain, unspecified type         Final Disposition: Still in emergency department  _______________________________________________________________________________________________    Enid Skeens, PA        Enid Skeens, [place]  06/19/23 1910

## 2023-06-18 NOTE — ED Provider Notes (Signed)
 [facility] EMERGENCY DEPARTMENT  [address]  [place] [address]      EMERGENCY DEPARTMENT ENCOUNTER    ATTENDING NOTE    Date of service: 06/19/2023  2:26 AM  Primary Care Provider: No PCP, Per Patient  Chief Complaint   Patient presents with    Chest Pain       HISTORY AND RECORD REVIEW:     REVIEW OF EXTERNAL RECORDS AND EXTERNAL DATA READ BY ME:     Data Reviewed: previous documentation such as medical problems, surgeries, medications, or allergies  Records Reviewed: last discharge summary from a hospitalization  Summary of Records Reviewed:     Patient has a history of anomalous RCA status post CABG in 2007 in 2012, NSTEMI in 2023 and was last admitted for chest pain in December 2024 with serial tropes at 16, 14 then 12 and unchanged EKGs.  He has an allergy to nitroglycerin per his report and declined Tylenol and explained that IV Dilaudid was the only medication that helps.      In the past, he has had a left heart cath without significant CAD and some irregularity at the RCA that was mild.    HISTORY OF PRESENT ILLNESS:    48 y.o. adult presenting with chest pain.  Left-sided, sharp, radiating to the left jaw, back, left shoulder and down his arm slightly.  Similar to previous episodes.  Started 30 minutes prior to arrival.  Associated with nausea and vomiting.  Denies abdominal pain, shortness of breath, leg pain or leg swelling, headache, numbness, weakness.    PERTINENT ROS  Denies any other symptoms such as dizziness or near syncope or syncope for me     PAST MEDICAL HISTORY / PROBLEM LIST    Past Medical History:   Diagnosis Date    Coronary artery disease     Diabetes mellitus (Multi-HCC)     Heart attack (Multi-HCC)     x3    Hypertension     pt denies having high BP       Patient Active Problem List   Diagnosis    Chest pain    Hypokalemia    Chest pain, unspecified type    Type 2 diabetes mellitus (Multi-HCC)    Tobacco abuse    Obesity    Presence of aortocoronary bypass  graft    PCR positive for hepatitis C virus    HLD (hyperlipidemia)    Essential hypertension    Congenital anomaly of artery (HHS-HCC)       PAST SURGICAL HISTORY    Past Surgical History:   Procedure Laterality Date    ARTERIAL BYPASS SURGERY      x2    CHOLECYSTECTOMY      CORONARY ARTERY BYPASS GRAFT      CTA ABDOMEN PELVIS W AND WO CONTRAST  12/03/2022    CTA ABDOMEN PELVIS W AND WO CONTRAST 12/03/2022       MEDICATIONS     Previous Medications    ASPIRIN 81 MG CAPSULE    Take 81 mg by mouth in the morning.    ATORVASTATIN (LIPITOR) 10 MG TABLET    Take 40 mg by mouth in the morning.    CLINDAMYCIN HCL ORAL    Take by mouth.    INSULIN GLARGINE (LANTUS) 100 UNIT/ML (3 ML) INJECTION    Inject 15 Units under the skin at bedtime.    METOPROLOL SUCCINATE XL (TOPROL-XL) 50 MG 24 HR TABLET    Take  25 mg by mouth twice daily.    METOPROLOL TARTRATE (LOPRESSOR) 25 MG TABLET    Take 25 mg by mouth in the morning and 25 mg in the evening.    VENTOLIN HFA 90 MCG/ACTUATION INHALER    Inhale 2 puffs every 6 (six) hours if needed.        ALLERGIES    Allergies   Allergen Reactions    Ibuprofen Anaphylaxis, Hives, Itching and Rash     Other reaction(s): Anaphylactic reaction, Unknown, Urticaria  anaphylaxis  anaphylaxis      Ketorolac Anaphylaxis, Hives and Rash     Other reaction(s): ITCHY HIVES, Unknown, Urticaria  Converted from Generic Allergy: Ketorolac  Anaphylaxis   Anaphylaxis   Tolerates ASA      Nitroglycerin      hives    Tramadol Anaphylaxis, Angioedema, Hives and Rash     Other reaction(s): THROAT CLOSES, Unknown, Urticaria  Patient confirmed that he can take Percocet, Hydromorphone and Morphine. [name], Pharmacy  anaphylaxis  anaphylaxis      Codeine        SOCIAL HISTORY    Social History     Tobacco Use    Smoking status: Every Day     Current packs/day: 0.50     Types: Cigarettes     Passive exposure: Current    Smokeless tobacco: Never   Substance Use Topics    Alcohol use: Never     Social History      Substance and Sexual Activity   Drug Use Never       FAMILY HISTORY    Family History   Problem Relation Name Age of Onset    Heart disease Father         Sloan Leiter    ED Triage Vitals   Temp Pulse Resp BP   06/18/23 2149 06/18/23 2147 06/18/23 2147 06/18/23 2147   36.9 C (98.4 F) 86 18 (!) 148/88      SpO2 Temp Source Heart Rate Source Patient Position   06/18/23 2147 06/18/23 2149 06/19/23 0313 06/19/23 0613   97 % Oral Monitor Semi-Fowler      BP Location FiO2 (%)     06/19/23 0613 --     Left arm           Vitals:    06/19/23 0613   BP: (!) 155/78   Pulse: 66   Resp: 14   Temp:    SpO2: 96%       PHYSICAL EXAM    General appearance: alert, pleasant, in no apparent distress  Eye exam: Absent: conjunctival icterus  ENT exam: normal oropharynx  Respiratory exam: Present: normal lung sounds bilaterally  Cardiovascular exam: Present: regular rate, normal rhythm   Abdominal exam: Present: soft.  Absent: tenderness  Neurological exam: Present: no facial droop, moving all four extremities spontaneously  Psychiatric exam: Present: normal affect  Skin exam: Present: warm, dry    MEDICAL DECISION MAKING & ED COURSE    Nursing notes reviewed and agreed with unless modified.    Pertinent labs and imaging studies reviewed.    INDEPENDENT INTERPRETATIONS    Medical Decision Making   Differential Diagnosis, Assessment and Plan     TESTS AND WORKUP CONSIDERED (including any clinical decision rules)  Labs, EKG, chest x-ray ordered.  Due to radiation to the back, as well as hypertension, we will consider CTA imaging of the chest to rule out dissection    TREATMENT CONSIDERED    Aspirin  was given prior to my evaluation.  We will also administer IV Dilaudid intermittently as needed.    ACUTE CONDITIONS CONSIDERED   Acute vasospasm, acute chest pain, acute ACS, dissection, PE, pneumothorax, pneumonia    Chronic Problems/Comorbidity Addressed    CHRONIC CONDITIONS (and their impact on today's care)   Chest pain, history of  CABG with acute chest pain    Medical Decision Making   Risk of Complications and/or Morbidity     Medication Management: Parenteral controlled substances    INDEPENDENT INTERPRETATIONS OF EKG, LABS, RADIOLOGY, OTHER if performed (see ED COURSE below for details)    Data Reviewed/Independent Interpretation (if performed)    Labs: labs reviewed and labs reviewed and independent interpretation performed by me, Lab details: see ED course for real-time analysis if performed  Radiology: radiology test reviewed and radiology test reviewed and independent interpretation performed by me, Radiology details: see ED course for real-time analysis if performed  ECG: ECG reviewed and ECG reviewed and independent interpretation performed by me, ECG details: see ED course for real-time analysis if performed  Cardiac Rhythm: Cardiac monitor/rhythm strip reviewed and independent interpretation performed by me, Cardiac Rhythm details: see ED course for real-time analysis if performed    Labs Reviewed   COMPREHENSIVE METABOLIC PANEL - Abnormal       Result Value    Sodium 140      Potassium 3.8      Chloride 105      CO2 (Bicarbonate) 24      Anion Gap 11      BUN 13      Creatinine 0.92      eGFRcr 105      Glucose 143 (*)     Fasting? Unknown      Calcium 9.8      AST 33      ALT 43      Alkaline phosphatase 56      Protein, total 7.6      Albumin 4.0      Bilirubin, total 0.2     TROPONIN T, HIGH SENSITIVITY - Abnormal    Troponin T, High Sensitivity 12 (*)    CBC WITH DIFFERENTIAL - Abnormal    WBC 5.9      RBC 4.24      Hemoglobin 12.6 (*)     Hematocrit 38.0      MCV 89.6      MCH 29.7      MCHC 33.2      RDW-CV 12.5      RDW-SD 40.9      Platelets 264      MPV 9.5      NRBC % 0.0      NRBC Absolute 0.00     POCT GLUCOSE - Abnormal    POCT Glucose 285 (*)    MANUAL DIFFERENTIAL - SYSMEX WAM - Abnormal    Neutrophils % 49      Lymphocytes % 40      Monocytes % 5      Eosinophils % 2      Basophils % 0      Reactive Lymphocytes % 4       Neutrophils Absolute 2.89      Lymphocytes Absolute 2.36      Monocytes Absolute 0.29 (*)     Eosinophils Absolute 0.12      Basophils Absolute 0.00      Reactive Lymphs Absolute 0.24 (*)     Total WBC Counted 113  RBC Morphology See Indices (*)    TROPONIN T, HIGH SENSITIVITY - Normal    Troponin T, High Sensitivity 9     NT PRO BNP - Normal    NT-proBNP 42      Narrative:     The 2021 Universal Definition and Classification of Heart Failure states that a diagnosis of heart failure (HF) requires symptoms and/or signs of HF caused by structural and/or functional cardiac abnormalities, accompanied by objective evidence of congestion and/or:     Test             Ambulatory Patients    Hospitalized/Decompensated   NT-proBNP (pg/mL)   >=125                  >=300         Patients with heart failure with preserved ejection fraction (HFpEF) have lower levels of the natriuretic peptides than patients with a similar severity of heart failure with reduced ejection fraction (HFrEF).     In dyspneic patients in emergency department settings,2 NT-proBNP cut-offs for diagnosis of acute HF were 450, 900, and 1,800 pg/ml for ages <50, 50 to 43, and >75 years.     (1) Bozkurt B et al.  J Cardiac Fail 2021;27:387-413   (2) Madalyn Rob et al. J Am Coll Cardiol 2018;71:1191-1200.    LIPASE - Normal    Lipase 33     SDS, SERUM DRUG SCREEN - Normal    Acetaminophen level <5      Ethanol level, plasma/serum <10      Salicylate level <0.5      Benzodiazepines screen, serum Not Detected      Tricyclics screen, serum Not Detected     TROPONIN T, HIGH SENSITIVITY - Normal    Troponin T, High Sensitivity 11     CBC W/DIFF    Narrative:     The following orders were created for panel order CBC and differential.  Procedure                               Abnormality         Status                     ---------                               -----------         ------                     CBC w/ Differential[[id number]]          Abnormal             Final result               Manual Differential (LAB.Marland KitchenMarland Kitchen[[id number]]  Abnormal            Final result                 Please view results for these tests on the individual orders.        XR CHEST 2 VIEWS   Preliminary Result      No radiographic evidence of acute pulmonary process.      DICTATED: Stanford Scotland, MD   IN ACCORDANCE WITH DEPARTMENT POLICY, TEACHING PHYSICIANS REVIEW ALL IMAGES, AND EDIT REPORTS AS REQUIRED. A REPORT IS NOT  FINAL UNTIL APPROVED BY A STAFF RADIOLOGIST.      CTA CHEST W AND WO CONTRAST    (Results Pending)       ED Course as of 06/20/23 0857   Wed Jun 19, 2023   0229 Troponin T, High Sensitivity(!): 12   0229 Auto WBC: 5.9   0229 Hemoglobin(!): 12.6   0229 Platelets: 264   0230 ECG 12 lead  I have visualized, examined, and interpreted the patient's EKG and this shows: NSR, no ST elevation     0349 Troponin T, High Sensitivity: 9   0503 NT-proBNP: 42   0631 XR CHEST 2 VIEWS  Chest x-ray, read by me, no acute findings           Diagnoses as of 06/20/23 0857   Chest pain, unspecified type       MY WORKING CLINICAL IMPRESSION / DIAGNOSIS (and other differential diagnosis items as pertinent):    No diagnosis found.    Disposition and Recommendations:    Care Transferred to another ED Attending: Dr. Anola Gurney    Pending CT imaging and reevaluation.       Darene Lamer, MD  06/20/23 [phone]

## 2023-06-18 NOTE — Unmapped (Signed)
Provider in Triage Evaluation:     Focused HPI   No chief complaint on file.    HPI  48 year old M complaining of chest pain. Reports it is substernal and radiates to his jaw and his left arm. He feels nauseated but has not vomited. Feels dizzy. Denies shortness of breath.             No data recorded                                Patient History   Past Medical History:   Diagnosis Date    Coronary artery disease     Diabetes mellitus (Multi-HCC)     Heart attack (Multi-HCC)     x3    Hypertension     pt denies having high BP     Past Surgical History:   Procedure Laterality Date    ARTERIAL BYPASS SURGERY      x2    CHOLECYSTECTOMY      CORONARY ARTERY BYPASS GRAFT      CTA ABDOMEN PELVIS W AND WO CONTRAST  12/03/2022    CTA ABDOMEN PELVIS W AND WO CONTRAST 12/03/2022     Family History   Problem Relation Name Age of Onset    Heart disease Father       Social History     Tobacco Use    Smoking status: Every Day     Current packs/day: 0.50     Types: Cigarettes     Passive exposure: Current    Smokeless tobacco: Never   Vaping Use    Vaping status: Not on file   Substance Use Topics    Alcohol use: Never    Drug use: Never       Focused Review of Systems   Review of Systems    Focused Physical Exam   ED Triage Vitals   Temp Pulse Resp BP   -- -- -- --      SpO2 Temp src Heart Rate Source Patient Position   -- -- -- --      BP Location FiO2 (%)     -- --       Physical Exam  Constitutional:       General: Bensyn is not in acute distress.     Appearance: Normal appearance. Stpehen is not ill-appearing or toxic-appearing.   Cardiovascular:      Rate and Rhythm: Normal rate.      Heart sounds: No murmur heard.     No gallop.   Pulmonary:      Effort: Pulmonary effort is normal. No respiratory distress.      Breath sounds: Normal breath sounds. No stridor. No wheezing, rhonchi or rales.   Neurological:      Mental Status: Onnie is alert.      Gait: Gait normal.       No orders to display       Labs Reviewed - No data to  display    Triage MDM/Plan      48 year old M complaining of chest pain. Plan on ASA, ECG, labs, CXR.     Dava Najjar, PA    Medical screening evaluation was performed in triage and workup was initiated. Patient was advised further evaluation and treatment may be required and advised not to leave the Emergency Department until completed.       Dava Najjar, PA  06/18/23  [phone]

## 2023-06-18 NOTE — ED Triage Notes (Signed)
Pt reports left sided chest pain that radiates to left arm and into left jaw. Denies SOB. Reports nausea and lightheadedness.

## 2023-06-19 ENCOUNTER — Emergency Department: Admit: 2023-06-19 | Payer: MEDICAID

## 2023-06-19 ENCOUNTER — Inpatient Hospital Stay
Admit: 2023-06-19 | Discharge: 2023-06-19 | Disposition: A | Discharge status: Home / Self Care | Payer: MEDICAID | Attending: Emergency Medicine

## 2023-06-19 DIAGNOSIS — R079 Chest pain, unspecified: Secondary | ICD-10-CM

## 2023-06-19 LAB — COMPREHENSIVE METABOLIC PANEL
ALT: 43 U/L (ref 0–55)
AST: 33 U/L (ref 6–42)
Albumin: 4 g/dL (ref 3.2–5.0)
Alkaline phosphatase: 56 U/L (ref 30–130)
Anion Gap: 11 mmol/L (ref 3–14)
BUN: 13 mg/dL (ref 6–24)
Bilirubin, total: 0.2 mg/dL (ref 0.2–1.2)
CO2 (Bicarbonate): 24 mmol/L (ref 20–32)
Calcium: 9.8 mg/dL (ref 8.5–10.5)
Chloride: 105 mmol/L (ref 98–110)
Creatinine: 0.92 mg/dL (ref 0.55–1.30)
Glucose: 143 mg/dL — ABNORMAL HIGH (ref 70–139)
Potassium: 3.8 mmol/L (ref 3.6–5.2)
Protein, total: 7.6 g/dL (ref 6.0–8.4)
Sodium: 140 mmol/L (ref 135–146)
eGFRcr: 105 mL/min/{1.73_m2} (ref >=60)

## 2023-06-19 LAB — MANUAL DIFFERENTIAL - SYSMEX WAM
Basophils %: 0 %
Basophils Absolute: 0 10*3/uL (ref 0.00–0.22)
Eosinophils %: 2 %
Eosinophils Absolute: 0.12 10*3/uL (ref 0.00–0.50)
Lymphocytes %: 40 %
Lymphocytes Absolute: 2.36 10*3/uL (ref 0.70–4.00)
Monocytes %: 5 %
Monocytes Absolute: 0.29 10*3/uL — ABNORMAL LOW (ref 0.36–0.83)
Neutrophils %: 49 %
Neutrophils Absolute: 2.89 10*3/uL (ref 1.50–7.95)
Reactive Lymphocytes %: 4 %
Reactive Lymphs Absolute: 0.24 10*3/uL — ABNORMAL HIGH (ref 0.00–0.00)
Total WBC Counted: 113

## 2023-06-19 LAB — CBC WITH DIFFERENTIAL
Hematocrit: 38 % (ref 32.0–53.0)
Hemoglobin: 12.6 g/dL — ABNORMAL LOW (ref 13.0–17.5)
MCH: 29.7 pg (ref 26.0–34.0)
MCHC: 33.2 g/dL (ref 31.0–37.0)
MCV: 89.6 fL (ref 80.0–100.0)
MPV: 9.5 fL (ref 9.1–12.4)
NRBC %: 0 % (ref 0.0–0.0)
NRBC Absolute: 0 10*3/uL (ref 0.00–2.00)
Platelets: 264 10*3/uL (ref 150–400)
RBC: 4.24 M/uL (ref 4.20–5.90)
RDW-CV: 12.5 % (ref 11.5–14.5)
RDW-SD: 40.9 fL (ref 35.0–51.0)
WBC: 5.9 10*3/uL (ref 4.0–11.0)

## 2023-06-19 LAB — POCT GLUCOSE: POCT Glucose: 285 mg/dL — ABNORMAL HIGH (ref 70–139)

## 2023-06-19 LAB — NT PRO BNP: NT-proBNP: 42 pg/mL (ref 0–125)

## 2023-06-19 LAB — TROPONIN T, HIGH SENSITIVITY
Troponin T, High Sensitivity: 12 ng/L — ABNORMAL HIGH (ref <12)
Troponin T, High Sensitivity: 9 ng/L (ref <12)
Troponin T, High Sensitivity: 11 ng/L (ref <12)

## 2023-06-19 LAB — SDS, SERUM DRUG SCREEN
Acetaminophen level: <5 ug/mL (ref <=30)
Benzodiazepines screen, serum: NOT DETECTED
Ethanol level, plasma/serum: <10 mg/dL (ref <=10)
Salicylate level: <0.5 mg/dL (ref 0.0–30.0)
Tricyclics screen, serum: NOT DETECTED mg/dL

## 2023-06-19 LAB — LIPASE: Lipase: 33 U/L (ref 8–60)

## 2023-06-19 MED ORDER — HYDROmorphone (Dilaudid) injection 0.5 mg
1 | Freq: Once | INTRAMUSCULAR | Status: AC
Start: 2023-06-19 — End: 2023-06-19
  Administered 2023-06-19: 09:00:00 0.5 mg via INTRAVENOUS

## 2023-06-19 MED ORDER — HYDROmorphone (Dilaudid) injection 0.5 mg
1 | Freq: Once | INTRAMUSCULAR | Status: AC
Start: 2023-06-19 — End: 2023-06-19
  Administered 2023-06-19: 11:00:00 0.5 mg via INTRAVENOUS

## 2023-06-19 MED ORDER — iopamidol (Isovue-370) 370 mg iodine /mL (76 %) injection 70 mL
370 | Freq: Once | INTRAVENOUS | Status: AC
Start: 2023-06-19 — End: 2023-06-19
  Administered 2023-06-19: 11:00:00 80 mL via INTRAVENOUS

## 2023-06-19 MED ORDER — acetaminophen (Tylenol) tablet 650 mg
325 | Freq: Once | ORAL | Status: DC
Start: 2023-06-19 — End: 2023-06-19

## 2023-06-19 MED ORDER — ondansetron (Zofran) injection 4 mg
4 | Freq: Once | INTRAMUSCULAR | Status: AC
Start: 2023-06-19 — End: 2023-06-19
  Administered 2023-06-19: 11:00:00 4 mg via INTRAVENOUS

## 2023-06-19 MED FILL — HYDROMORPHONE 1 MG/ML INJECTION WRAPPER: 1 1 mg/mL | INTRAMUSCULAR | Qty: 1

## 2023-06-19 MED FILL — ONDANSETRON HCL (PF) 4 MG/2 ML INJECTION SOLUTION: 4 4 mg/2 mL | INTRAMUSCULAR | Qty: 2

## 2023-06-19 MED FILL — ACETAMINOPHEN 325 MG TABLET: 325 325 mg | ORAL | Qty: 2

## 2023-06-19 MED FILL — ASPIRIN 81 MG CHEWABLE TABLET: 81 81 mg | ORAL | Qty: 4

## 2023-06-19 NOTE — ED Notes (Signed)
Delayed note entry r/t pt care. Pt arrived to ED for cc CP that woke pt up from his sleep. +nausea, denies vomiting. Pt is AOx4 at this time. Pt connected to cardiac monitor, cont pulse ox, and BP cuff. Respirations spontaneous even and unlabored, NAD. VSS. PIV established, CDI. EKG obtained in triage. Bed locked lowest position.       Elizbeth Squires, RN  06/19/23 [phone]

## 2023-06-19 NOTE — ED Notes (Signed)
Pt states he is nauseous and still in pain, rated 6/10. RN notified PA Gwenlyn Fudge.     Elizbeth Squires, RN  06/19/23 [phone]

## 2023-06-19 NOTE — ED Notes (Signed)
Discharge instructions reviewed with patient. NAD. ABC's intact. VSS. No outstanding questions or concerns.     Rande Lawman, RN  06/19/23 [phone]

## 2023-06-19 NOTE — Discharge Instructions (Addendum)
You are seen tonight in the emergency department.  You had negative troponins and negative cardiac workup.  Please follow-up with your cardiologist for continued chest pain workup.

## 2023-06-19 NOTE — ED Provider Notes (Signed)
TRANSFER OF CARE NOTE    I received the patient on sign out from the previous provider.    Briefly, the patient presented for chest pain.    Most recent documented vitals:  Vitals:    06/19/23 0821   BP: (!) 144/70   Pulse: 62   Resp: 20   Temp:    SpO2: 98%       ED COURSE:    Diagnoses as of 06/19/23 0824   Chest pain, unspecified type     Leukocytosis not present to suggest infection. Hemoglobin noted to be low suggesting acute anemia. No significant electrolyte derangements. Creatinine is not elevated more than baseline range making AKI unlikely. No significant transaminitis noted. Normal bilirubin. Lipase is not significantly elevated. Serial x3 troponin with negative delta change. ACS ruled out per the high-sensitivity troponin algorithm. BNP is not significantly elevated making acute CHF less likely. Patient has a HEART score of 3. A score <4 has 0.9-1.7% risk of adverse cardiac event. In the HEART Score study, these patients were discharged (0.99% in the retrospective study, 1.7% in the prospective study). If the CTA is negative for dissection, patient will be a good candidate for outpatient evaluation for atypical noncardiac chest pain with return precautions given, especially given the recent cath within the past year with no significant CAD. Low concern for PE as the patient is PERC negative.    8:23 AM  CTA negative for dissection.    DISPOSITION:  DISCHARGED FROM EMERGENCY DEPARTMENT  Patient will be discharged from the Emergency Department in stable condition. Assessment findings were reviewed and any questions were answered. Any diagnosis, results, and follow up were discussed, including return precautions.    Palmer Shorey Anola Gurney, MD     Tressie Ellis, MD  06/19/23 [phone]

## 2023-06-19 NOTE — ED Notes (Addendum)
Patient verbally aggressive with this RN. Patient reports, "I need pain medications more than tylenol. This shit doesn't work!" PA-C made aware and brought to patient bedside. PA-C educated patient about discharge instructions and pain management. Patient became verbally aggressive and posturing at this RN. Attending MD an security at pt bedside. Patient discharged with security escort.     Rande Lawman, RN  06/19/23 0831       Rande Lawman, RN  06/19/23 [phone]

## 2023-06-19 NOTE — Progress Notes (Signed)
[facility]  ED Attestation Note    I personally made/approved the management plan and take responsibility for the patient management.    Georgette Shell, MD       Georgette Shell, MD  06/25/23 [phone]

## 2023-06-19 NOTE — ED Provider Notes (Signed)
Date of service: 06/19/2023                                                                       EMERGENCY DEPARTMENT ENCOUNTER      Triage Notes Reviewed by Provider: Lanny Hurst, NP 01/29/259:04 PM  History Obtained From: Patient, old records      CHIEF COMPLAINT    Chief Complaint   Patient presents with   . Chest Pain     Sudden onset L sided CP while out walking. Pain radiates to LUE. + nausea. Denies SOB or palpitations. Significant cardiac hx.          HPI    The patient is a 48 y.o. male with a history CAD, MI, CABG, HLD, tobacco use presenting with L sided c/p starting 40 minutes PTA. Feels a squeezing sensation without provoking or alleviating factors. Some lightheadedness. No SOB, extrem. pain, back pain, or abd pain reported. He was sitting at rest when this began. EMS gave 324 mg ASA en route.    Has hx of provoked PE in past after surgery. Not on anticoags. Has brace on L knee for "torn ACL", last several weeks, no calf pain.      PAST MEDICAL HISTORY  Past Medical History[1]    Problem List[2]    SURGICAL HISTORY  Past Surgical History[3]    CURRENT MEDICATIONS  Patient Medication List   Previous Medications    AMLODIPINE (NORVASC) 2.5 MG TABLET    ASPIRIN 81 MG EC TABLET    ATORVASTATIN (LIPITOR) 40 MG TABLET    INSULIN GLARGINE (LANTUS) 100 UNIT/ML INJECTION    METOPROLOL TARTRATE (LOPRESSOR) 25 MG TABLET       ALLERGIES  Allergies[4]    FAMILY HISTORY  Family History[5]    SOCIAL HISTORY  Social History[6]    PHYSICAL EXAM  VITAL SIGNS:   ED Triage Vitals [06/19/23 1932]   Triage Vitals Group      BP (!) 161/78      Heart Rate 66      Resp 16      Temp 98.6 F (37 C)      SpO2 95 %      Actual Weight (!) 116 kg (255 lb)      Height 1.93 m (6\' 4" )      0-10 Pain Score  9      Wong-Baker FACES Pain Rating      Constitutional: NAD  Skin: WWP, No rashes  HEENT: NCAT, PERRL  Neck: Supple, No tenderness  Chest: Non tender. Sternotomy scar  Cardiovascular: RRR, no murmur  Thorax & Lungs: CTA-B, Good  air movement  Abdomen: NTND, NABS  Back: No tenderness  Extremities: No edema, WWP. RP and DP pulses 2+ bilat, no calf tenderness  Neurologic: Speech fluent, moves all extremities  Psychiatric: Mood Normal    EKG/RADIOLOGY/LABS    Results for orders placed or performed during the hospital encounter of 06/19/23 (from the past 24 hours)   Comprehensive Metabolic Panel   Result Value Ref Range    Sodium 139 135 - 146 mmol/L    Potassium 4.2 3.4 - 5.2 mmol/L    Chloride 103 98 - 110 mmol/L    Total CO2/Bicarbonate 26 24 -  32 mmol/L    Anion Gap 10 2 - 15 mmol/L    BUN 11 7 - 24 mg/dL    Creatinine, Blood 6.27 0.60 - 1.30 mg/dL    Glucose, Blood 035 (H) 50 - 100 mg/dL    Calcium 9.9 8.5 - 00.9 mg/dL    Total Protein 7.3 6.2 - 8.2 g/dL    Albumin, Blood 3.9 3.4 - 5.2 g/dL    Globulin Result 3.4 2.0 - 4.0 g/dL    AST (SGOT) 38 11 - 40 U/L    ALT (SGPT) 41 (H) 7 - 40 U/L    Alkaline Phosphatase 57 30 - 115 U/L    Total Bilirubin 0.2 0.2 - 1.2 mg/dL    Estimated GFR(CKD-EPI) 103 >=60 mL/min/BSA   Magnesium   Result Value Ref Range    Magnesium, Blood 1.8 1.6 - 2.6 mg/dL   Troponin   Result Value Ref Range    Troponin T HS 12 <52 ng/L   CBC and Differential   Result Value Ref Range    WBC 5.35 4.00 - 11.00 K/uL    RBC 4.08 (L) 4.10 - 5.60 M/uL    Hemoglobin 12.2 (L) 12.7 - 16.7 g/dL    Hematocrit 38.1 (L) 38.1 - 50.1 %    MCH 29.9 23.0 - 37.0 pg    MCHC 33.0 29.0 - 38.0 g/dL    MCV 91 82 - 98 fL    RDW 12.6 11.5 - 14.5 %    Platelet Count 290 150 - 450 K/uL    MPV 10.3 6.0 - 14.0 fL    Neutrophil 59.6 %    Lymphocyte 30.3 %    Monocyte 6.7 %    Eosinophil 2.6 %    Basophil 0.4 %    Immature Granulocyte (Meta, Myelo, Promyelocyte) 0.4 %    Absolute Neutrophil Count 3.19 1.50 - 7.70 K/uL    Absolute Immature Granulocyte (Meta, Myelo, Promyelocyte) 0.02 0.00 - 0.09 K/uL    Absolute Lymphocyte Count 1.62 1.50 - 4.00 K/uL    Absolute Monocyte Count 0.36 0.16 - 1.26 K/uL    Absolute Eosinophil Count 0.14 (L) 0.15 - 0.30 K/uL     Absolute Basophil Count 0.02 0.00 - 0.21 K/uL   D-dimer   Result Value Ref Range    D-Dimer <=270 <=500 ng/mL FEU       XR Chest 1 Vw Portable (if unable to travel or on a cardiac monitor)    Result Date: 06/19/2023  EXAM DESCRIPTION:  XR CHEST 1 VW PORTABLE CLINICAL HISTORY:  Chest pain.  48 year old male. COMPARISON:  Portable chest x-ray from 06/01/2023. TECHNIQUE:  A single AP upright portable view of the chest was obtained at 20:06. FINDINGS:  There are multiple cardiac leads overlying the chest. This patient has undergone a prior CABG.  The heart size remains at the upper limits of normal, likely exaggerated secondary to the technique.  The mediastinal contours are normal. The lungs are well expanded.  No focal airspace consolidation to suggest pneumonia.  The pulmonary vasculature is within normal limits.  No large pneumothorax or pleural effusion. The visualized bones are well mineralized.  Again, this patient has undergone a prior median sternotomy.     No acute pulmonary process. Prior median sternotomy.     ECG 12 lead, STAT, show MD    Result Date: 06/19/2023  Test Reason : Chest Pain, unspecified Vent. Rate : 066 BPM     Atrial Rate : 066 BPM  P-R Int : 184 ms          QRS Dur : 100 ms     QT Int : 388 ms       P-R-T Axes : 044 019 080 degrees    QTc Int : 406 ms Normal sinus rhythm Possible Left atrial enlargement Borderline ECG When compared with ECG of 02-Jun-2023 15:19, No significant change was found Referred By:             Confirmed By:     CTA CHEST W AND WO CONTRAST    Result Date: 06/19/2023  NAME: Aariz L Veazey DOB: 1975-07-31 AGE: 44 years GENDER: Male MRN: [id number] ORD PHYS: Enid Skeens LOCATION: [facility] 06/19/2023 6:03 AM EST [id number] (CTA CHEST W AND WO CONTRAST) [id number] EXAM: CTA Aorta Chest. HISTORY:  Chest pain, dissection protocol. TECHNIQUE: Helical CT imaging was performed without contrast from above the apices through the lung bases with retrospectively  gated technique. Axial reformatted images were created at the CT console. Helical CT scanning was performed from lung apices through the lung bases following the administration of 150 cc Isovue 370 injected at 4 cc/sec with a retrospectively gated technique, using Care Bolus timing.  Axial, coronal and sagittal MIP images and a volume rendered 3D model were created at the CT console. Contrast was administered per departmental protocol given the patient's history. Radiation Optimization: All CT scans at this facility use at least one of these dose optimization techniques: automated exposure control; mA and/or kV adjustment per patient size (includes targeted exams where dose is matched to clinical indication); or iterative reconstruction. COMPARISON: CTA cardiac 05/24/2017. FINDINGS: CHEST: VASCULAR: Aortic root: 3.0 cm.  Sinotubular junction: 2.6 cm. Ascending aorta: 3.0 cm. Aortic arch: 2.7 cm. Descending thoracic aorta: 2.1 cm. 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 There is a 4 vessel arch with the left vertebral artery arising from the arch distal to the left subclavian artery, a normal variant. No thoracic aortic dissection. No ascending thoracic aortic aneurysm. Great vessels: Patent. Visualized celiac trunk and superior mesenteric artery: Patent. Common hepatic artery arises from the aorta, a normal variant. Pulmonary vasculature: Dilated pulmonary trunk measuring up to 3.4 cm. No filling defects in the pulmonary arteries. NON-VASCULAR: Visualized thyroid gland: Unremarkable. Enlarged mediastinal lymph nodes measuring up to 1.5 cm short axis diameter in the subcarinal station, not significantly changed. Heart: Stable cardiomegaly. Lungs: nonspecific peribronchovascular groundglass attenuation in the lungs, bilateral, roughly unchanged from 05/24/2017. Mild scattered subsegmental atelectases bilaterally. No focal consolidation. No pleural effusion or pneumothorax. Visualized abdominal  organs: Surgically absent gallbladder. Mild left adrenal gland thickening without discrete nodules. Soft tissues: Unremarkable. Bones: Median sternotomy wires and sternal closure device are noted.    1.  No thoracic aortic dissection. No acute findings in the chest. 2.  Dilated main pulmonary trunk suggestive of pulmonary hypertension. 3.  Nonspecific peribronchovascular groundglass attenuation in the lungs, roughly unchanged since 2019 4.  Mediastinal lymphadenopathy, nonspecific and unchanged since 2019. 5.  Postsurgical changes from prior RCA bypass graft. DICTATED: Juanita Laster, MD [place] [facility] WITH DEPARTMENT POLICY, TEACHING PHYSICIANS REVIEW ALL IMAGES, AND EDIT REPORTS AS REQUIRED. A REPORT IS NOT FINAL UNTIL APPROVED BY A STAFF RADIOLOGIST. APPROVED BY STAFF RADIOLOGIST: Filomena Jungling, MD 06/19/2023 1:48 PM EST    XR Chest 2 VW    Result Date: 06/19/2023  NAME: Amarie L Montella DOB: 12-11-1975 AGE: 44 years GENDER: Male MRN: [id number] ORD PHYS: Dava Najjar LOCATION: [facility] 06/19/2023 3:14 AM EST IMG36 (XR CHEST 2 VIEWS) [id number] EXAM: XR CHEST 2 VIEWS INDICATION: Chest pain TECHNIQUE: Erect PA and lateral views of the chest. COMPARISON: Chest radiograph 05/07/2023.      FINDINGS: LINES AND TUBES: Unchanged sternotomy closure device, mediastinal surgical clips, and sternotomy wires including a fractured wire. HEART AND MEDIASTINUM: Cardiomediastinal silhouette is prominent, unchanged. LUNGS AND PLEURA: No focal consolidation. Increased interstitial lung markings, nonspecific. No pleural effusion. No pneumothorax. BONES: No acute osseous abnormality. UPPER ABDOMEN: No subdiaphragmatic free air.    Increased interstitial lung markings, nonspecific. No consolidations or effusions. DICTATED: Stanford Scotland, MD IN [place] WITH DEPARTMENT POLICY, TEACHING PHYSICIANS REVIEW ALL IMAGES, AND EDIT REPORTS AS REQUIRED. A REPORT IS NOT FINAL UNTIL APPROVED BY A STAFF RADIOLOGIST. APPROVED BY STAFF  RADIOLOGIST: Luane School, MD 06/19/2023 8:26 AM EST    XR Chest 2 VW    Result Date: 06/14/2023  EXAMINATION/TECHNIQUE: XR CHEST 2 VW: INDICATION: Chest pain COMPARISON: Chest radiograph 05/16/2023, CT chest 12/03/2022 FINDINGS: Lines/Tubes/Devices: Unchanged appearance of sternotomy with talon device and several fractured sternotomy wires. Unchanged appearance of mediastinal surgical clips and clips at the hepatic hilum. Lungs: The lungs are well-aerated without consolidation or pulmonary edema. Pleura: No pleural effusion or pneumothorax. Heart and Mediastinum: The cardiac and mediastinal contours are normal. Bones: No acute osseous abnormality.    No radiographic evidence of acute cardiopulmonary process. I, Dominique Rowcroft, have reviewed the examination and concur with the findings as reported or so edited. Trainee:  Tamala Bari If this radiology report contains a blank impression section, it is an incomplete radiology report.  Please contact the interpreting radiologist or applicable radiology division as soon as possible to obtain the completed interpretation.           Workstation ID: [id number]    XR: Chest PA and Lateral    Result Date: 06/08/2023  X-RAY CHEST PA AND LATERAL HISTORY: Chest Pain TECHNIQUE: Frontal and lateral views of the chest. COMPARISON: X-ray 05/14/2023      FINDINGS: Lines/tubes: None. Lungs: The lungs are well inflated and clear. There is no evidence of pneumonia or pulmonary edema. Pleura: There is no pleural effusion or pneumothorax. Heart and mediastinum: Unchanged cardiomediastinal silhouette. Bones: No acute abnormality. Median sternotomy wires.    IMPRESSION: No acute cardiopulmonary abnormality by radiograph. RADCAT Grade:RADCAT2: Routine result. Report created by Hipolito Bayley, MD   Date Read:06/08/2023 6:58 PM Ernestene Mention, MD has reviewed the report and all the images related to this patient encounter. Signing Doctor: Ernestene Mention, MD          Contributing  Doctor:          Date Signed:06/08/2023 6:59 PM Patient UXL:[id number]          Accession [id number] DOS:06/08/2023 6:41 PM          Exam:[id number] X-RAY CHEST PA AND LATERAL    NM Myocardial Perfusion Spect Single    Result Date: 06/03/2023  EXAM DESCRIPTION:  NM MYOCARDIAL PERFUSION SPECT SINGLE CLINICAL HISTORY:  48 year old male with history of chest pain. Evaluate for ischemia. COMPARISON:  12/31/2008: Impression:    1.  Normal left ventricular myocardial perfusion study.    2.  Normal left ventricular wall motion, with a calculated ejection     fraction of 55%. TECHNIQUE:  Rest non corrected images only were completed.  Received 20 mCi of technetium 99 M tetrofosmin for rest imaging.  Patient left AMA. FINDINGS:  Rest non attenuation corrected images demonstrate a small area  of mild-to-moderately decreased counts involving the inferior apex.     Small area of mild-to-moderately decreased perfusion involving the inferior apex on rest images.  Attenuation corrected images were not acquired.  Stress images were not acquired therefore cannot assess whether or not the defect represents a fixed defect or may be related to artifact.     ECG 12 lead    Result Date: 06/03/2023  Test Reason : Chest Pain, unspecified Vent. Rate : 086 BPM     Atrial Rate : 086 BPM    P-R Int : 166 ms          QRS Dur : 096 ms     QT Int : 350 ms       P-R-T Axes : 053 040 082 degrees    QTc Int : 418 ms Normal sinus rhythm Possible Left atrial enlargement Borderline ECG When compared with ECG of 01-Jun-2023 01:38, No significant change was found Confirmed by Arcelia Jew ([id number]) on 06/03/2023 9:01:30 AM Referred By:             Confirmed ON:GEXBM Larsen    ECG 12 lead, STAT, show MD    Result Date: 06/01/2023  Test Reason : Chest Pain, unspecified Vent. Rate : 086 BPM     Atrial Rate : 086 BPM    P-R Int : 170 ms          QRS Dur : 094 ms     QT Int : 362 ms       P-R-T Axes : 045 015 081 degrees    QTc Int : 433 ms Normal sinus rhythm Minimal  voltage criteria for LVH, may be normal variant ( Cornell product ) Borderline ECG Confirmed by Al-Husami, Wael ([id number]) on 06/01/2023 11:23:11 AM Referred By:             Confirmed WU:XLKG Al-Husami    XR Chest 1 VW Portable    Result Date: 06/01/2023  CODE1 EXAM DESCRIPTION:  XR CHEST 1 VW PORTABLE CLINICAL HISTORY:  Chest pain.  48 year old male. COMPARISON:  Portable chest x-ray from 05/19/2023. TECHNIQUE:  A single AP upright portable view of the chest was obtained at 01:38. FINDINGS:  There are multiple cardiac leads overlying the chest.  The cardiac silhouette is within normal limits for size.  The mediastinal contours are normal.  This patient has undergone a prior median sternotomy.  There are surgical clips in the right upper chest The lungs are well expanded.  No focal airspace consolidation to suggest pneumonia.  The pulmonary vasculature is within normal limits.  No large pneumothorax or pleural effusion. The visualized bones are well mineralized.  Again, this patient has undergone a prior median sternotomy.     No acute pulmonary process. Prior median sternotomy. RESULT CODE:  CODE1     ECG 12 lead, to be obtained, chest pain    Result Date: 05/27/2023  Normal sinus rhythm Normal ECG When compared with ECG of 06-Apr-2023 19:51, No significant change was found Electronically signed by MD Eyvonne Left [phone]) on 05/27/2023 3:04:18 PM    XR Chest 2 VW    Result Date: 05/22/2023  EXAMINATION: Chest 2 views. CLINICAL INDICATION: Chest pain. COMPARISON: Chest 2 views 05/22/2017. FINDINGS: The lungs are expanded and of clear acute process. Heart size is enlarged. Pulmonary vascularity is normal. There are mediastinal sutures, staples from previous intervention. No gross bony abnormality seen.    Mild cardiomegaly. No acute process seen. No major change from previous exam  05/22/2017 -------- FINAL REPORT -------- Dictated By: Jordan, Mrinal Dictated Date: 05/22/2023 13:57 ET Assigned Physician: Jordan, Mrinal Reviewed and  Electronically Signed By: Jordan, Mrinal Signed Date: 05/22/2023 13:59 ET Workstation ID: [id number] Transcribed By: Shona Needles Transcribed Date: 05/22/2023 13:57 ET     All radiology studies independently reviewed    All lab studies independently reviewed    ECG: NSR at 66, PR Interval normal, QTc normal, T wave inversion aVL, unchanged from 06/02/2023.     ED COURSE/PROCEDURES/ASSESSMENT    Pt w/ above hx and exam. Significant cardiac history. Ddx includes ACS, PE, ptx, costochondritis.     Will obtain cardiac labs, d-dimer for low overall suspicion for PE.     EKG shows T wave inversion aVL, which is unchanged from prior, otherwise unremarkable.    PROCEDURE NOTE: Ultrasound-guided peripheral IV performed by Myself   Patient has poor IV access.  Under ultrasound guidance, a 20-gauge 2.5 inch IV catheter was placed in the left bicep region a few centimeters proximal of the antecubital fossa on the 1st attempt without difficulty.      MDM:  Review of non-ED Records Reviewed:  Care Everywhere and Prev. adm  Diagnostic Tests Considered but not performed:  CTA Chest  Diagnostic Tests Considered but not performed Comments:  D-dimer below assay  Independent interpretation of tests:  EKG and Chest X-ray        ED Course as of 06/19/23 2104   Wed Jun 19, 2023   2032 D-Dimer: <=270  Chest XR reviewed by radiologist and myself: NAD    D-dimer below assay, PE ruled out [JM]   2048 Troponin T HS: 12 [JM]   2049 Basic labs are reassuring. [JM]   2059 Initial troponin is within limits.  Given the patient's extensive cardiac history, and persistent chest pain, recommended admission to telemetry.  Patient is in agreement with this plan and is actually his preference.  Will discuss with hospitalist. [JM]   2103 I discussed this case with Dr. Carmie End, hospitalist.  Patient transferred to inpatient team [JM]      ED Course User Index  [JM] Lanny Hurst, NP       Vitals:    06/19/23 1932 06/19/23 2051   BP: (!) 161/78 (!) 148/66   BP  Location: Left arm Right arm   Patient Position: Lying Lying   Pulse: 66 67   Resp: 16 16   Temp: 98.6 F (37 C)    TempSrc: Oral    SpO2: 95% 97%   Weight: (!) 116 kg (255 lb)    Height: 1.93 m (6\' 4" )          Clinical Impression   1. Chest pain in adult            ED Disposition       ED Disposition   Admit    Condition   --    Comment   Telemetry: Yes   Indication for Telemetry: Chest Pain: Intermediate or High Risk   ED Call back number: [phone]   Admitting Hospital: [facility] [2022]                       Lanny Hurst, NP  06/19/23 2108         [1]   Past Medical History:  Diagnosis Date   . Chest pain     numerous ER visits and extensive workup.   . 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 s/p right internal mammary artery grafting to distal RCA. 04/2011 [place] cath: atretic RIMA graft to the RCA with a 70% stenosis.  Left AMA.  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   . Current smoker    . MI (myocardial infarction) (HCC)    . Mixed hyperlipidemia    . Narcotic abuse, episodic (HCC)     since 04/2016 per [facility]: 24 short prescriptions for painkillers from 22 providers and 18 pharmacies.    . Nonadherence to medical treatment    [2]   Patient Active Problem List  Diagnosis   . Drug-seeking behavior   . Non-insulin dependent type 2 diabetes mellitus (HCC)   . Chronic coronary artery disease   . Current smoker   . Nonadherence to medical treatment   . Chest pain at rest   . Chest pain, rule out acute myocardial infarction   . Atypical chest pain   . Syncope   . Left against medical advice   . Hyponatremia   . Hyperglycemia   . Hypomagnesemia   . Chest pain   . Transaminitis   . Dyslipidemia   . Chest pain in adult   . Chest pain   [3]   Past Surgical History:  Procedure Laterality Date   . CORONARY  ARTERY BYPASS GRAFT  2013    2007, 2013   . CARDIAC CATHETERIZATION     . CHOLECYSTECTOMY     [4]   Allergies  Allergen Reactions   . Coconut Fatty Acid Diethanolamide Anaphylaxis   . Codeine Rash, Hives and Unknown     Denies allergy   Denies allergy   hives   . Ketorolac Rash, Anaphylaxis and Hives     Anaphylaxis     Anaphylaxis   . Nitroglycerin Other (See Comments), Hives, Rash and Unknown     "syncope and hives"    Patient states "passess out"   States drops his BP "too much"   "syncope and hives"   Patient states "passess out"   States drops his BP "too much"   "syncope and hives"   Patient states "passess out"   States drops his BP "too much"    hives   . Tramadol Rash, Anaphylaxis, Hives and Unknown     Tolerated IV morphine 01/06/18    anaphylaxis   anaphylaxis    Patient confirmed that he can take Percocet, Hydromorphone and Morphine. [name], Pharmacy   . Nitrofuran Derivative Hives   . Ibuprofen Hives   . Nitrofurantoin Hives   [5]   Family History  Problem Relation Name Age of Onset   . Heart attack Mother     [6]   Social History  Socioeconomic History   . Marital status: Single   Tobacco Use   . Smoking status: Every Day     Current packs/day: 2.00     Average packs/day: 2.0 packs/day for 41.1 years (82.2 ttl pk-yrs)     Types: Cigarettes, Vaping     Start date: 50   . Smokeless tobacco: Never   Substance and Sexual Activity   . Alcohol use: Yes     Comment: very rarely   . Drug use: Not Currently     Comment: denies (06/26/2018)

## 2023-06-19 NOTE — ED Notes (Signed)
[facility] NOTIFICATION 06/19/2023 19:21 Dustin Carney, Dustin Carney DOB: [birth year] MRN: [id number]    Criteria Met    3 ED Locations in 90 Days  6 ED Visits in 6 Months    Security and Safety  No Security Events were found.  ED Care Guidelines  There are currently no ED Care Guidelines for this patient. Please check your facility's medical records system.        Prescription Drug Data  No Prescription Drug Data was found.    E.D. Visit Count (12 mo.)  Facility Visits   [facility] 5   [facility] 5   [facility] 5   [facility] 5   [facility] 4   [facility] 4   [facility] 4   [facility] 4   [facility] 3   [facility] 3   [facility] 3   [facility] 3   [facility] 2   [facility] 2   [facility] 2   [facility] 2   [facility] 2   [facility] 2   [facility] 2   [facility]  2   [facility] 2   [facility] 2   [facility] 1   [facility] 1   [facility] 1   [facility] 1   [facility] 1   [place] [facility] 1   [facility] 1   [facility] 1   [facility] 1   [facility] 1   [facility] 1   [facility] 1   [facility] 1   [facility] 1   [facility] 1   [facility] 1   [facility] [place] 1   [facility] 1   [facility] 1   [facility] 1   Total 88   Note: Visits indicate total known visits.     Recent Emergency Department Visit Summary  Showing 10 most recent visits out of 88 in the past 12 months   Date Facility [facility] Type Diagnoses  or Chief Complaint    Jun 19, 2023  [place] Let[facility] Pate.  [place]  Emergency    Chest pain      Jun 19, 2023  [facility]  [place].  [place]  Emergency    Chest pain, unspecified    Chest Pain      Jun 18, 2023  [employer]  Myna Bright.  [place]  Emergency     Jun 13, 2023  [facility]  [place]  Emergency    Chest pain, unspecified    Chest Pain      Jun 13, 2023  [facility]  [place]  [place]  Emergency    Chest pain, unspecified    Chest Pain      Jun 13, 2023  [facility]  [place]  [place]  Emergency    chest pain      Jun 09, 2023  [facility]  [place].  [place]  Emergency     Jun 08, 2023  [facility]  [place]  [place]  Emergency    1. Chest pain, unspecified    2. Atherosclerotic heart disease of native coronary artery without angina pectoris    3. Old myocardial infarction    4. Long term (current) use of aspirin    5. Other long term (current) drug therapy    6. Long term (current) use of insulin      Jun 08, 2023  [facility]   [place]  Emergency    Chest Pain      Jun 01, 2023  [facility].  [place]  Emergency    Other forms of angina pectoris    Chest Pain        Recent Inpatient Visit Summary  Showing 10 most recent visits out of 15 in the past 12 months   Date Facility [facility] Type Diagnoses or Chief Complaint    Jun 13, 2023  [facility].  [place]  Cardiology    Chest pain, unspecified      Jun 06, 2023  [facility]  [place]  [place]  Nursery    Chest pain, unspecified    Chest Pain      May 19, 2023  [facility].  [place]  General Medicine    Chest pain, unspecified      May 14, 2023  [facility]  [place].  [place]  Inpatient    Anesthesia of skin    Chest Pain      May 12, 2023  [facility]  [place].  [place]  Emergency    Chest pain, unspecified    Atherosclerotic heart disease of native coronary artery without angina pectoris    Syncope and collapse      Apr 27, 2023  [facility]  [place].  [place]  General  Medicine    Chest pain, unspecified      Apr 10, 2023  [facility]   [place]  Inpatient    Atherosclerotic heart disease of native coronary artery with unstable angina pectoris    Cerebral infarction, unspecified    Chest Pain    Numbness      Feb 23, 2023  [facility]  [place].  [place]  Cardiology     Feb 08, 2023  [facility] [place]  [place]  [place]  Medical Surgical    Allergy status to analgesic agent    Personal history of sudden cardiac arrest    Old myocardial infarction    Atherosclerotic heart disease of native coronary artery without angina pectoris    Allergy to other foods    Acquired absence of other specified parts of digestive tract    Type 2 diabetes mellitus without complications    Personal history of urinary calculi    Other symptoms and signs involving appearance and behavior    Pulmonary hypertension, unspecified      Jan 28, 2023  [facility]  [place].  [place]  Cardiology    Chest Pain    Unstable angina        Care Team  Provider Specialty Phone Fax Service Dates   Alvira Monday, DO Emergency Medicine [phone]  Current    [place] [phone] Current    Maxie Better, MD Internal Medicine   Current    Nunzio Cobbs (MD), M.D. Emergency Medicine   Current    Franciso Bend, DO Emergency Medicine [phone]  Current    Haroldine Laws, MD Pediatrics [phone] Current    Lovett Calender, M.D. Family Medicine [phone] Current    PATEL, Kennyth Lose, MD Hospitalist   Current    Floy Sabina, M.D. Emergency Medicine [phone]  Current    Adalberto Ill MD Jomarie Longs, M.D. Emergency Medicine   Current    Mirna Mires, DO Emergency Medicine [phone]  Current    Vania Rea Emergency Medicine [phone]  Current    Laverle Hobby MD J, MD Internal Medicine: Interventional Cardiology [phone] Current      [facility]  This patient has  registered at the [facility] Emergency Department  For more information visit: [email or web address]   PLEASE NOTE:     Any care recommendations and other clinical information are provided as guidelines or for historical purposes only, and providers should exercise their own clinical judgment when providing care.    You may only use this information for purposes of treatment, payment or health care operations activities, and subject to the limitations of applicable [facility] Policies.    You should consult directly with the organization that provided a care guideline or other clinical history with any questions about additional information or accuracy or completeness of information provided.     2025 [facility] - [email or web address]

## 2023-06-19 NOTE — H&P (Signed)
 History and Physical Examination      Date of Service: 06/19/2023                                                                                                               Patient: Dustin Carney  DOB: 01-20-1976   MRN#: [id number]  CSN: [id number]  Attending: Gearldine Bienenstock, MD;Louis Junita Push*  Admit Date: 06/19/2023  Inpatient Status Admit Date: N/A  Primary Care Physician: Pcp None, MD None           Chief Complaint: Chest pain left-sided radiating to left arm    History of Present Illness:  This is a 48 y.o. male with significant cardiac history including chronic coronary artery disease with history of CABG x 2 first in 2007 and second in 2013, diabetes mellitus type 2 on long-term insulin (A1c 2 weeks ago measured was 8.1), essential hypertension, tobacco use disorder prior, hyperlipidemia, cholecystectomy, who presents to the [facility] emergency room with chest pain.    The patient states the chest pain came on suddenly while walking.  He did not have some nausea but no vomiting.  He denied any shortness of breath associated with the pain and denied any palpitations.  He says its pain that he has gotten before.  He did not take nitroglycerin as he is allergic to nitro.    EMS brought him to the ED and he was given 324 mg of aspirin en route.    In the emergency room on arrival:  Temperature 98.6 heart rate 66 respiratory rate 16 blood pressure 161/78 O2 sat 95% on room air  This is a 48 year old male who appears stated age alert no acute distress overweight mildly nontoxic in appearance not diaphoretic  HEENT normocephalic atraumatic extract movements within normal limits pupils equal reactive sclera anicteric conjunctiva pink  Neck supple no JVD  Tongue midline mucous membranes moist no oral lesions  Lungs were clear free of wheezes rales or rhonchi there was no splinting on deep inspiration  Heart was regular rate and rhythm without rubs or murmurs  Abdomen was obese soft nontender without any gross  organomegaly or masses no guarding or rebound  Extremities showed no calf tenderness bilaterally no significant peripheral edema  Neuroexam was grossly intact nonfocal patient was alert and oriented x 3    EKG in the emergency room showed a normal sinus rhythm at 66 bpm without any significant ST-T changes there was a T wave inversion in aVL but no significant ischemic changes    Lab work in the emergency room:  Sodium 139 potassium 4.2 CO2 26 BUN 11 creatinine 0.9  Blood sugar 179  Magnesium 1.8  Sensitive troponin 12  White blood count 5.35 hemoglobin 12.2 hematocrit 37 platelet count 290,000 with 59% neutrophils  LFTs were unremarkable except for an ALT of 41  A D-dimer was added and it was less than 270    The patient was given 4 mg of IV morphine with only partial relief of his symptoms.  He was ordered  additional dose of morphine.    The patient be admitted to telemetry at [facility].    Of note the patient was at a [facility] earlier in the day.  On 06/19/2023  On 1/28 he presented with chest pain.  He ruled out while there with negative troponins.  In addition he had a CTA of the chest with and without contrast that showed: No thoracic aortic dissection no acute findings in the chest.  Dilated main pulmonary trunk suggestive of pulmonary hypertension.  Nonspecific Perry bronchovascular groundglass attenuation of lungs roughly unchanged since 2019  Mediastinal lymphadenopathy nonspecific and unchanged since 2019  Postsurgical changes from prior RCA bypass graft.  There was no evidence of pulmonary embolism.    On further review of his record he had a myocardial perfusion stress test 05/28/2023 at another facility.  The report is available in Care Everywhere.  The impression was there was no evidence of potential ischemic or previously infarcted myocardium.  Global LV function was normal.  Regional wall motion was normal.  The LV end-diastolic volume was normal transient ischemic dilatation index was  normal and the lung heart ratio was normal.    It appears through review of the record that the patient has chronic chest pain.  He has apparently been prescribed Percocet for it.  The patient when questioned stated that he has a future appointment with a cardiologist but he cannot recall the name.  He does not presently have a primary care physician.    Due to his significant cardiac history and risk factor of hyperlipidemia and diabetes he will be admitted for further observation and further evaluation.      Utilizing accepted standards of medical judgment, I have assessed the Patient's capacity to make and communicate healthcare decisions regarding the following:    Code Status: I have discussed Code Status and its implications with the Patient, who was determined to have the capacity to make medical decisions. The patient chose to be Full Code.    Home Situation lives with family:  parent  Baseline Functional Status lives in [place], presently was staying with his Mother  Has no PCP, does not know name of his cardiologist that he  was recently set up with -says he has an appointment    History:   Past Medical History[1]      Past Surgical History[2]  Family History[3]  Social History[4]     Contacts on File       Name HCP Status Relationship HCP Last Review Date    [place]  Mother             Allergies:   Coconut fatty acid diethanolamide, Codeine, Ketorolac, Nitroglycerin, Tramadol, Nitrofuran derivative, Ibuprofen, and Nitrofurantoin     Medications:   The medication reconciliation is a dynamic process. To the best of the writer's knowledge, these were the medications the patient was on prior to admission but may be subject to change as more information becomes available.    Prior to Admission Medications   Prescriptions Last Dose Informant Patient Reported? Taking?   amLODIPine (NORVASC) 2.5 MG tablet Not Taking  No No   Sig: Take 1 tablet (2.5 mg total) by mouth daily for 30 days.   Patient not  taking: Reported on 06/19/2023   aspirin 81 MG EC tablet 06/18/2023 Morning  No Yes   Sig: Take 4 tablets (324 mg total) by mouth daily.   atorvaSTATin (LIPITOR) 40 MG tablet 06/19/2023 Morning  Yes Yes  Sig: Take 2 tablets (80 mg total) by mouth.   insulin glargine (LANTUS) 100 unit/mL injection 06/19/2023 Morning  Yes Yes   Sig: Inject 20 Units under the skin at bedtime.   metoprolol tartrate (LOPRESSOR) 25 MG tablet 06/19/2023 Morning  No Yes   Sig: Take 0.5 tablets (12.5 mg total) by mouth every morning & every evening.   Patient taking differently: Take 1 tablet (25 mg total) by mouth every morning & every evening.      Facility-Administered Medications: None         Review of Systems:  All other additional systems were reviewed and are negative.  Constitutional: No fever or chills  Respiratory: No productive cough or wheezing  Cardiovascular: Has chronic chest pain, pain today was typical, cannot take nitro  Gastrointestinal: No hematemesis no change in bowel habits no dark stool  Genitourinary:negative  Musculoskeletal:negative  Neurological: negative     Physical Exam:  Min/Max vitals over last 24 hours:  BP  Min: 148/66  Max: 161/78  Temp  Min: 98.6 F (37 C)  Max: 98.6 F (37 C)  Pulse  Min: 66  Max: 67  Resp  Min: 16  Max: 16  SpO2  Min: 95 %  Max: 97 %  Weight  Min: 116 kg (255 lb)  Max: 116 kg (255 lb)    General Appearance:  48 year old male overweight, alert, cooperative in, no acute distress, appears stated age, nontoxic in appearance not diaphoretic   Head:    Normocephalic, without obvious abnormality, atraumatic   Eyes:    PERRL, conjunctiva/corneas clear, EOM's intact.       Throat:   Lips, mucosa, and tongue normal;   Neck:   Supple, symmetrical, trachea midline, no adenopathy;        no carotid bruit or JVD   Back:     Symmetric, no curvature, ROM normal, no CVA tenderness   Lungs:     Clear to auscultation bilaterally, respirations unlabored, no wheezes rales or rhonchi no splinting on deep  inspiration   Chest wall:    No tenderness or deformity, well-healed surgical scar in the midline   Heart:    Regular rate and rhythm, S1 and S2 normal, no murmur, rub     or gallop   Abdomen:   Obese but soft, non-tender, bowel sounds active all four quadrants,, no guarding or rebound    no masses, no organomegaly   Extremities: No calf tenderness bilaterally extremities normal, atraumatic, no cyanosis or edema   Skin:   Skin color, texture, turgor normal, no rashes or lesions                                Neuro: Alert and oriented x 3 cranial nerves grossly intact no focal motor deficits speech fluent    Labs, Imaging & Other Studies:   Radiology: Recent imaging studies have been reviewed and are notable for   Results for orders placed or performed during the hospital encounter of 06/19/23   XR Chest 1 Vw Portable (if unable to travel or on a cardiac monitor)    Narrative    EXAM DESCRIPTION:  XR CHEST 1 VW PORTABLE  CLINICAL HISTORY:  Chest pain.  48 year old male.    COMPARISON:  Portable chest x-ray from 06/01/2023.    TECHNIQUE:  A single AP upright portable view of the chest was  obtained at 20:06.    FINDINGS:  There are multiple cardiac leads overlying the chest.  This patient has undergone a prior CABG.  The heart size remains at  the upper limits of normal, likely exaggerated secondary to the  technique.  The mediastinal contours are normal.        The lungs are well expanded.  No focal airspace consolidation to  suggest pneumonia.  The pulmonary vasculature is within normal  limits.  No large pneumothorax or pleural effusion.        The visualized bones are well mineralized.  Again, this patient has  undergone a prior median sternotomy.      Impression    No acute pulmonary process.        Prior median sternotomy.         Laboratory: All recent labs have been reviewed. Pertinent labs include   Recent Results (from the past 24 hours)   Comprehensive Metabolic Panel    Collection Time: 06/19/23  8:10 PM    Result Value Ref Range    Sodium 139 135 - 146 mmol/L    Potassium 4.2 3.4 - 5.2 mmol/L    Chloride 103 98 - 110 mmol/L    Total CO2/Bicarbonate 26 24 - 32 mmol/L    Anion Gap 10 2 - 15 mmol/L    BUN 11 7 - 24 mg/dL    Creatinine, Blood 1.61 0.60 - 1.30 mg/dL    Glucose, Blood 096 (H) 50 - 100 mg/dL    Calcium 9.9 8.5 - 04.5 mg/dL    Total Protein 7.3 6.2 - 8.2 g/dL    Albumin, Blood 3.9 3.4 - 5.2 g/dL    Globulin Result 3.4 2.0 - 4.0 g/dL    AST (SGOT) 38 11 - 40 U/L    ALT (SGPT) 41 (H) 7 - 40 U/L    Alkaline Phosphatase 57 30 - 115 U/L    Total Bilirubin 0.2 0.2 - 1.2 mg/dL    Estimated GFR(CKD-EPI) 103 >=60 mL/min/BSA   Magnesium    Collection Time: 06/19/23  8:10 PM   Result Value Ref Range    Magnesium, Blood 1.8 1.6 - 2.6 mg/dL   Troponin    Collection Time: 06/19/23  8:10 PM   Result Value Ref Range    Troponin T HS 12 <52 ng/L   CBC and Differential    Collection Time: 06/19/23  8:10 PM   Result Value Ref Range    WBC 5.35 4.00 - 11.00 K/uL    RBC 4.08 (L) 4.10 - 5.60 M/uL    Hemoglobin 12.2 (L) 12.7 - 16.7 g/dL    Hematocrit 40.9 (L) 38.1 - 50.1 %    MCH 29.9 23.0 - 37.0 pg    MCHC 33.0 29.0 - 38.0 g/dL    MCV 91 82 - 98 fL    RDW 12.6 11.5 - 14.5 %    Platelet Count 290 150 - 450 K/uL    MPV 10.3 6.0 - 14.0 fL    Neutrophil 59.6 %    Lymphocyte 30.3 %    Monocyte 6.7 %    Eosinophil 2.6 %    Basophil 0.4 %    Immature Granulocyte (Meta, Myelo, Promyelocyte) 0.4 %    Absolute Neutrophil Count 3.19 1.50 - 7.70 K/uL    Absolute Immature Granulocyte (Meta, Myelo, Promyelocyte) 0.02 0.00 - 0.09 K/uL    Absolute Lymphocyte Count 1.62 1.50 - 4.00 K/uL    Absolute Monocyte Count 0.36 0.16 - 1.26 K/uL    Absolute Eosinophil Count  0.14 (L) 0.15 - 0.30 K/uL    Absolute Basophil Count 0.02 0.00 - 0.21 K/uL   D-dimer    Collection Time: 06/19/23  8:10 PM   Result Value Ref Range    D-Dimer <=270 <=500 ng/mL FEU   POCT Glucose    Collection Time: 06/19/23 10:34 PM   Result Value Ref Range    Glucose, POC 145 (H) 70 -  118 mg/dL       EKG: EKG reviewed. Tracing was significant for normal sinus rhythm at 66 bpm with T wave inversion in aVL but otherwise no ST elevations or other significant ST-T changes        Impression:  This is a 48 y.o. male with significant cardiac history including chronic coronary artery disease with history of CABG x 2 first in 2007 and second in 2013, diabetes mellitus type 2 on long-term insulin (A1c 2 weeks ago measured was 8.1), essential hypertension, tobacco use disorder., hyperlipidemia, prior cholecystectomy who presents with recurrent chest pain    Assessment/Plan:  Principal Problem:    Chest pain with high risk for cardiac etiology (POA: Yes)  The patient certainly has multiple risk factors.  He has been seen at multiple facilities on review of his Care Everywhere regarding his chest pain.  He states that he has a future appointment with a cardiologist but does not recall the name  He also does not have a primary care physician.  Will ask social services to see the patient  Due to his testing earlier this year for now we will not order any tests however will asked Dr. Gerrianne Scale to see the patient  Will order serial troponins  Additional morphine has been ordered for his chest pain  He had a CTA which ruled out PE or any significant pulmonary process earlier today at another facility    Active Problems:    Diabetes mellitus type 2, insulin dependent (HCC) (POA: Not Applicable)-will check hemoglobin A1c  Will order his Lantus which he takes in the morning and sliding scale coverage        Current smoker (POA: Yes)-the patient declines any nicotine replacement therapy he does not feel he would needed    Dyslipidemia (POA: Yes)-will check lipid panel will continue with atorvastatin 40 mg daily    DVT prophylaxis-Lovenox ordered subcu    CODE STATUS-full code         Level of Care/Acuity:  The patient's disease process/comorbidities can be reasonably managed with a brief period of assessment and   treatment of less than 2 midnights hospital stay. The of risk of morbidity or mortality dictates the need to remain at the hospital because the risk of an adverse event would otherwise be unacceptable under reasonable standards of care.    85 min   spent in patient evaluation and management including independent review of lab, radiology, and EKG results.  More than 50% of this time spent on patient counseling.    Signed By: Loma Sender, MD                 [1]   Past Medical History:  Diagnosis Date   . Chest pain     numerous ER visits and extensive workup.   . 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 s/p right internal mammary artery grafting to distal RCA. 04/2011 [place] cath: atretic RIMA graft to the RCA with a 70%  stenosis.  Left AMA.  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   . Current smoker    . MI (myocardial infarction) (HCC)    . Mixed hyperlipidemia    . Narcotic abuse, episodic (HCC)     since 04/2016 per mass[facility]: 24 short prescriptions for painkillers from 22 providers and 18 pharmacies.    . Nonadherence to medical treatment    [2]   Past Surgical History:  Procedure Laterality Date   . CORONARY ARTERY BYPASS GRAFT  2013    2007, 2013   . CARDIAC CATHETERIZATION     . CHOLECYSTECTOMY     [3]   Family History  Problem Relation Name Age of Onset   . Heart attack Mother     [4]   Social History  Tobacco Use   . Smoking status: Every Day     Current packs/day: 2.00     Average packs/day: 2.0 packs/day for 41.1 years (82.2 ttl pk-yrs)     Types: Cigarettes, Vaping     Start date: 44   . Smokeless tobacco: Never   Substance Use Topics   . Alcohol use: Yes     Comment: very rarely   . Drug use: Not Currently     Comment: denies (06/26/2018)

## 2023-06-19 NOTE — ED Triage Notes (Signed)
Pt to ED via EMS with c/o sudden onset L sided CP while out walking. Denies n/v, SOB or palpitations. Significant cardiac hx. Seen at [facility] this morning for same. VSS. NSR on telemetry. Awaiting provider eval.

## 2023-06-19 NOTE — ED Notes (Signed)
Assumed care of patient. NAD. ABC's intact. VSS. NSR. CP remains 5/10 and non radiating pressure. Refused tylenol and reports, "That is not going to work. " ED MD at pt bedside. Pending CT results.    Past Medical History:   Diagnosis Date    Coronary artery disease     Diabetes mellitus (Multi-HCC)     Heart attack (Multi-HCC)     x3    Hypertension     pt denies having high BP          Rande Lawman, RN  06/19/23 [phone]

## 2023-06-19 NOTE — ED Progress Note (Signed)
EMERGENCY DEPARTMENT ENCOUNTER    TRANSFER OF CARE:  RESIDENT/PA  NOTE      8:13 AM:  I assumed care of patient at signout.  Briefly, BOOKER BHATNAGAR is a 48 y.o. adult who  has a past medical history of Coronary artery disease, Diabetes mellitus (Multi-HCC), Heart attack (Multi-HCC), and Hypertension..  The patient presents with Chest Pain  . Please see previous provider's note for details.    Medical Decision Making  Patient was signed out to me awaiting CT scan results.  CT scan did not show any dissection.  Patient to follow-up with primary care physician.  Patient expresses understanding agreement this plan.  Patient encouraged to use conservative care.    Problems Addressed:  Chest pain, unspecified type: complicated acute illness or injury    Amount and/or Complexity of Data Reviewed  Labs: ordered.  Radiology: ordered.    Risk  OTC drugs.  Prescription drug management.              Diagnoses as of 06/19/23 0813   Chest pain, unspecified type                   Irine Seal, [place]  06/19/23 [phone]

## 2023-06-20 LAB — HEMOGLOBIN A1C CARE EVERYWHERE
ESTIMATED AVERAGE GLUCOSE CARE EVERYWHERE: 177 mg/dL
HEMOGLOBIN A1C CARE EVERYWHERE: 7.8 % — ABNORMAL HIGH (ref 4.6–5.6)

## 2023-06-20 NOTE — Discharge Summary (Signed)
Discharge Summary    Patient: Dustin Carney  DOB: 1976/03/01   MRN#: [id number]  CSN: [id number]   Date Of Admission: 06/19/2023  Inpatient Status Admit Date: N/A  Date Of Discharge:  06/20/2023; pt left AMA before I had chance to see him  Attending Physician: No att. providers found  Service: Internal Medicine  Primary Care Physician: Pcp None, MD None    Advanced Care Planning:    Code Status:  Full Code      Health Care Proxy:  Contacts on File       Name HCP Status Relationship HCP Last Review Date    [place]  Mother            HCP was not contacted during this admission    Follow-up Appointments:                Incidental findings:  none     Pending Tests on Discharge:     Principal Hospital Problem/Problem List:  Discharge Diagnoses    Diagnosis POA   . Chest pain with high risk for cardiac etiology Yes   . Dyslipidemia Yes   . Chronic coronary artery disease Yes   . Current smoker Yes   . Diabetes mellitus type 2, insulin dependent (HCC) Not Applicable      Resolved Diagnoses   No resolved problems to display.       Reason for Admission: chest pain left-sided radiating to L arm    History Of Present Illness: per Dr. Carmie End: "48 y.o. male with significant cardiac history including chronic coronary artery disease with history of CABG x 2 first in 2007 and second in 2013, diabetes mellitus type 2 on long-term insulin (A1c 2 weeks ago measured was 8.1), essential hypertension, tobacco use disorder prior, hyperlipidemia, cholecystectomy, who presents to the [facility] emergency room with chest pain.     The patient states the chest pain came on suddenly while walking.  He did not have some nausea but no vomiting.  He denied any shortness of breath associated with the pain and denied any palpitations.  He says its pain that he has gotten before.  He did not take nitroglycerin as he is allergic to nitro.     EMS brought him to the ED and he was given 324 mg of aspirin en route.     In the emergency room on  arrival:  Temperature 98.6 heart rate 66 respiratory rate 16 blood pressure 161/78 O2 sat 95% on room air  This is a 49 year old male who appears stated age alert no acute distress overweight mildly nontoxic in appearance not diaphoretic  HEENT normocephalic atraumatic extract movements within normal limits pupils equal reactive sclera anicteric conjunctiva pink  Neck supple no JVD  Tongue midline mucous membranes moist no oral lesions  Lungs were clear free of wheezes rales or rhonchi there was no splinting on deep inspiration  Heart was regular rate and rhythm without rubs or murmurs  Abdomen was obese soft nontender without any gross organomegaly or masses no guarding or rebound  Extremities showed no calf tenderness bilaterally no significant peripheral edema  Neuroexam was grossly intact nonfocal patient was alert and oriented x 3     EKG in the emergency room showed a normal sinus rhythm at 66 bpm without any significant ST-T changes there was a T wave inversion in aVL but no significant ischemic changes     Lab work in the emergency room:  Sodium 139 potassium 4.2 CO2 26 BUN  11 creatinine 0.9  Blood sugar 179  Magnesium 1.8  Sensitive troponin 12  White blood count 5.35 hemoglobin 12.2 hematocrit 37 platelet count 290,000 with 59% neutrophils  LFTs were unremarkable except for an ALT of 41  A D-dimer was added and it was less than 270     The patient was given 4 mg of IV morphine with only partial relief of his symptoms.  He was ordered additional dose of morphine.     The patient be admitted to telemetry at [facility].     Of note the patient was at a [facility] affiliated hospital earlier in the day.  On 06/19/2023  On 1/28 he presented with chest pain.  He ruled out while there with negative troponins.  In addition he had a CTA of the chest with and without contrast that showed: No thoracic aortic dissection no acute findings in the chest.  Dilated main pulmonary trunk suggestive of pulmonary hypertension.  Nonspecific  Perry bronchovascular groundglass attenuation of lungs roughly unchanged since 2019  Mediastinal lymphadenopathy nonspecific and unchanged since 2019  Postsurgical changes from prior RCA bypass graft.  There was no evidence of pulmonary embolism.     On further review of his record he had a myocardial perfusion stress test 05/28/2023 at another facility.  The report is available in Care Everywhere.  The impression was there was no evidence of potential ischemic or previously infarcted myocardium.  Global LV function was normal.  Regional wall motion was normal.  The LV end-diastolic volume was normal transient ischemic dilatation index was normal and the lung heart ratio was normal.     It appears through review of the record that the patient has chronic chest pain.  He has apparently been prescribed Percocet for it.  The patient when questioned stated that he has a future appointment with a cardiologist but he cannot recall the name.  He does not presently have a primary care physician.     Due to his significant cardiac history and risk factor of hyperlipidemia and diabetes he will be admitted for further observation and further evaluation."    HOSPITAL COURSE PROBLEM ORIENTED:    The patient ruled out for an MI by enzymes. He was to be seen by Dr. Gerrianne Scale of cardiology. Before I had a chance to see him myself, he apparently became frustrated about not getting iv morphine. He requested that his IV be removed and he left before signing AMA paperwork or waiting for myself or cardiology. I never met him.     Physical Exam:  N/A    Consults:  Cardiology was consulted but patient did not want to wait      Allergies:  Coconut fatty acid diethanolamide, Codeine, Ketorolac, Nitroglycerin, Tramadol, Nitrofuran derivative, Ibuprofen, and Nitrofurantoin    Home Medications:  Prescriptions Prior to Admission[1]    Discharge Medications:     Medication List        ASK your doctor about these medications      amLODIPine 2.5 MG  tablet  Take 1 tablet (2.5 mg total) by mouth daily for 30 days.  Commonly known as: NORVASC        aspirin 81 MG EC tablet  Take 4 tablets (324 mg total) by mouth daily.        atorvaSTATin 40 MG tablet  Take 2 tablets (80 mg total) by mouth.  Commonly known as: LIPITOR        insulin glargine 100 unit/mL injection  Inject 20 Units  under the skin at bedtime.  Commonly known as: LANTUS        metoprolol tartrate 25 MG tablet  Take 0.5 tablets (12.5 mg total) by mouth every morning & every evening.  Commonly known as: LOPRESSOR                 Discharge Orders:            Discharge Disposition:  Left AMA but didn't sign paperwork    Discharge plan was not discussed with patient because he left before I could see him.   25 minutes were spent in documenting events and talking with RN.     Lab:  Results from last 7 days   Lab Units 06/19/23  2010   WBC K/uL 5.35   HEMOGLOBIN g/dL 16.1*   HEMATOCRIT % 09.6*   PLATELETS K/uL 290         Results from last 7 days   Lab Units 06/20/23  0553 06/19/23  2010   SODIUM mmol/L 141 139   POTASSIUM mmol/L 3.9 4.2   CHLORIDE mmol/L 105 103   CO2 mmol/L 29 26   BUN mg/dL 11 11   CREATININE mg/dL 0.45 4.09   CALCIUM mg/dL 9.6 9.9   TOTAL PROTEIN g/dL 6.7 7.3   BILIRUBIN TOTAL mg/dL 0.3 0.2   ALK PHOS U/L 53 57   ALT U/L 36 41*   AST U/L 27 38   GLUCOSE mg/dL 95 811*         Results from last 7 days   Lab Units 06/20/23  0553   HEMOGLOBIN A1C % 7.8*     Albumin, Blood   Date Value Ref Range Status   06/20/2023 3.6 3.4 - 5.2 g/dL Final         Coronavirus SARS-CoV-2   Date Value Ref Range Status   06/02/2023 Not Detected Not Detected Final   12/07/2020 NEGATIVE NEGATIVE Final     Comment:       Testing performed by Gene Xpert Nucleic Acid Amplified test.     Test validation is pending independent review by the U.S.  Food and Drug Administration for Emergency Use Authorization     Testing performed at:     [facility]  [address]  [place]  [address]  Tele: [phone]  CLIA# [id number]  Laboratory Director: Tyrone Apple. Hitchcock M.D.         Microbiology:  No results found for this visit on 06/19/23.    Signed by: Donnalee Curry, MD             [1] (Not in a hospital admission)

## 2023-06-20 NOTE — ED Notes (Signed)
Pt states to this RN, "if I am not getting any more morphine I am leaving." Pt requested IV removed, Rn complied. Pt overall well appearing and VSS. Ambulatory out of dept with steady gait. Unwilling to wait for any discharge or AMA.

## 2023-06-26 DIAGNOSIS — G8929 Other chronic pain: Secondary | ICD-10-CM

## 2023-06-26 DIAGNOSIS — M25562 Pain in left knee: Secondary | ICD-10-CM

## 2023-06-26 NOTE — Discharge Instructions (Addendum)
You were given a knee brace to help with the discomfort in your left knee.  Attached to this paperwork is our local orthopedics number for follow-up.
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
## 2023-06-26 NOTE — ED Triage Notes (Addendum)
C/o increased leg pain starting today. States known torn meniscus on left side x6 month. Has not seen any follow up - needs referral to ortho.   States took over 2,000 of Tylenol, RICE and heat w/o relief.  Denies recent traumas or falls.

## 2023-06-27 ENCOUNTER — Inpatient Hospital Stay
Admit: 2023-06-27 | Discharge: 2023-06-27 | Disposition: A | Discharge status: Home / Self Care | Payer: PRIVATE HEALTH INSURANCE | Attending: Emergency Medicine

## 2023-06-27 NOTE — Telephone Encounter (Signed)
Received notification that patient was seen at [facility] ER on 06/26/23    Dx lt knee pain    DOI ?    No xray    Records given to Dr Marcene Corning to review and advise  Dustin Carney

## 2023-06-27 NOTE — Telephone Encounter (Signed)
If patient calls advise patient he should FU first with his pcp  Karolee Stamps

## 2023-07-02 LAB — UNMAPPED LAB RESULTS: Phosphorous (EXT): 2.8 mg/dL (ref 2.7–4.5)

## 2023-07-06 ENCOUNTER — Emergency Department (HOSPITAL_BASED_OUTPATIENT_CLINIC_OR_DEPARTMENT_OTHER): Payer: No Typology Code available for payment source

## 2023-07-06 ENCOUNTER — Other Ambulatory Visit: Payer: Self-pay

## 2023-07-06 ENCOUNTER — Emergency Department
Admission: EM | Admit: 2023-07-06 | Discharge: 2023-07-06 | Discharge status: Against Medical Advice | Payer: No Typology Code available for payment source | Attending: Emergency Medicine | Admitting: Emergency Medicine

## 2023-07-06 DIAGNOSIS — R0789 Other chest pain: Secondary | ICD-10-CM | POA: Insufficient documentation

## 2023-07-06 DIAGNOSIS — R6884 Jaw pain: Secondary | ICD-10-CM | POA: Insufficient documentation

## 2023-07-06 DIAGNOSIS — R079 Chest pain, unspecified: Secondary | ICD-10-CM

## 2023-07-06 DIAGNOSIS — Z1152 Encounter for screening for COVID-19: Secondary | ICD-10-CM | POA: Diagnosis not present

## 2023-07-06 DIAGNOSIS — M79602 Pain in left arm: Secondary | ICD-10-CM | POA: Diagnosis not present

## 2023-07-06 DIAGNOSIS — R110 Nausea: Secondary | ICD-10-CM | POA: Diagnosis not present

## 2023-07-06 LAB — CBC, PLATELET & DIFFERENTIAL
ABSOLUTE BASOPHIL COUNT MANUAL: 0 10*3/uL (ref 0.0–0.1)
ABSOLUTE EO COUNT MANUAL: 0.1 10*3/uL (ref 0.0–0.8)
ABSOLUTE LYMPH COUNT MANUAL: 1.8 10*3/uL (ref 0.6–5.9)
ABSOLUTE MONOCYTE COUNT MANUAL: 0.2 10*3/uL (ref 0.2–1.4)
ABSOLUTE NEUT COUNT MANUAL: 2.1 THuL (ref 1.6–8.3)
ABSOLUTE NRBC COUNT: 0 10*3/uL (ref 0.0–0.0)
ATYPICAL LYMPHOCYTES %: 12 % (ref 0.0–12.0)
BAND NEUTROPHILS %: 2 % (ref 0.0–8.0)
BASOPHILS %: 0 % (ref 0.0–1.2)
EOSINOPHILS %: 3 % (ref 0.0–7.0)
HEMATOCRIT: 40.7 % (ref 40.1–51.0)
HEMOGLOBIN: 13.6 g/dL — ABNORMAL LOW (ref 13.7–17.5)
LYMPHOCYTES %: 31 % (ref 15.0–54.0)
MEAN CORP HGB CONC: 33.4 g/dL (ref 31.0–37.0)
MEAN CORPUSCULAR HGB: 29.8 pg (ref 26.0–34.0)
MEAN CORPUSCULAR VOL: 89.1 fL (ref 80.0–100.0)
MEAN PLATELET VOLUME: 10 fL (ref 8.7–12.5)
MONOCYTES %: 4 % (ref 4.0–13.0)
NRBC %: 0 % (ref 0.0–0.0)
NUCLEATED RED BLOOD CELLS: 0 /100{WBCs} (ref 0.0–0.0)
PLATELET COUNT: 229 10*3/uL (ref 150–400)
PLATELET ESTIMATE: NORMAL
POLYMORPHONUCLEAR (SEGS) %: 48 % (ref 40.0–75.0)
RBC DISTRIBUTION WIDTH STD DEV: 41.5 fL (ref 35.1–46.3)
RED BLOOD CELL COUNT: 4.57 M/uL — ABNORMAL LOW (ref 4.60–6.10)
WHITE BLOOD CELL COUNT: 4.3 10*3/uL (ref 4.0–11.0)

## 2023-07-06 LAB — COMPREHENSIVE METABOLIC PANEL
ALANINE AMINOTRANSFERASE: 73 U/L — ABNORMAL HIGH (ref 12–45)
ALBUMIN: 4.4 g/dL (ref 3.4–5.2)
ALKALINE PHOSPHATASE: 66 U/L (ref 45–117)
ANION GAP: 11 mmol/L (ref 10–22)
ASPARTATE AMINOTRANSFERASE: 45 U/L — ABNORMAL HIGH (ref 8–34)
BILIRUBIN TOTAL: 0.3 mg/dL (ref 0.2–1.0)
BUN (UREA NITROGEN): 15 mg/dL (ref 7–18)
CALCIUM: 10 mg/dL (ref 8.5–10.5)
CARBON DIOXIDE: 27 mmol/L (ref 21–32)
CHLORIDE: 103 mmol/L (ref 98–107)
CREATININE: 0.9 mg/dL (ref 0.7–1.2)
ESTIMATED GLOMERULAR FILT RATE: >60 mL/min (ref >60)
Glucose Random: 162 mg/dL — ABNORMAL HIGH (ref 74–160)
POTASSIUM: 4.2 mmol/L (ref 3.5–5.1)
SODIUM: 141 mmol/L (ref 136–145)
TOTAL PROTEIN: 7.7 g/dL (ref 6.4–8.2)

## 2023-07-06 LAB — TROPONIN T HS BASELINE: TROPONIN T HS BASELINE: 14 ng/L (ref 0–15)

## 2023-07-06 LAB — LIPASE: LIPASE: 24 U/L (ref 13–60)

## 2023-07-06 MED ORDER — ACETAMINOPHEN 500 MG PO TABS
1000.0000 mg | ORAL_TABLET | Freq: Once | ORAL | Status: DC
Start: 2023-07-06 — End: 2023-07-07

## 2023-07-06 MED ORDER — ONDANSETRON HCL 4 MG/2ML IJ SOLN
4.0000 mg | Freq: Once | INTRAMUSCULAR | Status: AC
Start: 2023-07-06 — End: 2023-07-06
  Administered 2023-07-06: 4 mg via INTRAVENOUS
  Filled 2023-07-06: qty 2

## 2023-07-06 NOTE — Narrator Note (Signed)
Patient Disposition  Patient education for diagnosis, medications, activity, diet and follow-up.  Patient left ED 11:18 PM.  Patient rep received written instructions.    Interpreter to provide instructions: No    Patient belongings with patient: YES    Have all existing LDAs been addressed? No    Have all IV infusions been stopped? No    Destination: Discharged to home      Pt left AMA

## 2023-07-06 NOTE — Narrator Note (Signed)
Patient declined tylenol and reported " ill just sit here in pain until you release me" and " tylenol won't do anything"  Provider made aware

## 2023-07-06 NOTE — Narrator Note (Signed)
 Assumed care, report is given.

## 2023-07-06 NOTE — ED Provider Notes (Signed)
 The patient was seen primarily by me. ED nursing record was reviewed. Select prior records as available electronically through the Epic record were reviewed.    HPI:    Nicholas Salas is a 48 year old male patient with past medical history significant for acute hepatitis C, asthma, coronary atherosclerosis, hypertension, history of drug abuse, history of nonadherence to medical treatment, MDD, narcotic drug use, hyperlipidemia, type 2 diabetes as well as extensive cardiac history including CABG 2012, presents to the emergency department today chief complaint of substernal chest pain/pressure has been ongoing for the past 30 minutes.  This is companied with some shortness of breath and radiation down into the left arm with some numbness.  Patient was just recently admitted yesterday at [facility] for similar presentation.  Discharged around 2 PM today.  He denies any fevers, chills, night sweats.    ROS: Pertinent positives were reviewed as per the HPI above. All other systems were reviewed and are negative.  Norm Salt  Language of care: English  MRN: [id number]  PCP: None  Mode of arrival to ED: Amb[facility] [place].  Arrival time:     Chief complaint: Chest Pain    Past Medical History/Problem list:  Past Medical History:  No date: Acute myocardial infarction, unspecified site, episode of   care unspecified  No date: CAD (coronary artery disease)  No date: Hypercholesteremia  No date: MI (myocardial infarction) (HCC)  No date: Mitral valve prolapse  Patient Active Problem List:     Controlled Medication request     Acquired absence of other specified parts of digestive tract     Acute hepatitis C virus infection without hepatic coma     Anomalous right coronary artery     Asthma with acute exacerbation     Coronary atherosclerosis of native coronary artery     Congenital anomaly of artery     Essential hypertension     History of drug abuse (HCC)     History of nonadherence to  medical treatment     Postsurgical aortocoronary bypass status     Kidney stones     Major depressive disorder, recurrent episode, moderate (HCC)     Narcotic drug use     Mixed hyperlipidemia     Type 2 diabetes mellitus (HCC)     Tobacco dependence syndrome    Past Surgical History: Past Surgical History:  2007: CABG W/ARTERIAL GRAFT SINGLE ARTERIAL GRAFT  No date: PR MINIMALLY INVASIVE DIRECT CO  Social History:   Social History     Socioeconomic History    Marital status: Single     Spouse name: Not on file    Number of children: Not on file    Years of education: Not on file    Highest education level: Not on file   Occupational History    Not on file   Tobacco Use    Smoking status: Every Day     Current packs/day: 0.25     Types: Cigarettes    Smokeless tobacco: Never   Substance and Sexual Activity    Alcohol use: Not on file    Drug use: Never    Sexual activity: Not on file   Other Topics Concern    Not on file   Social History Narrative    Not on file   Social Drivers of Health  Financial Resource Strain: Medium Risk (06/01/2023)      Received from [facility]  Overall Financial Resource Strain (CARDIA)          Difficulty of Paying Living Expenses: Somewhat hard  Food Insecurity: Low Risk  (07/06/2023)      Received from [employer] [facility] - SDOH Screener          Within the past 12 months, the food you bought just didn't last and you didn't have money to get more?: Never true          Within the past 12 months, you worried whether your food would run out before you got money to buy more?: Never true          Do you need help with Food resources? : Not on file          Patient indicated no issues from the most recent SDOH questionnaire: Not on file  Recent Concern: Food Insecurity - Food Insecurity Present (06/01/2023)      Received from [facility]      Hunger Vital Sign          Worried About Running Out of Food in the Last Year: Often true          Ran  Out of Food in the Last Year: Not on file  Transportation Needs: Low Risk  (07/06/2023)      Received from [employer] - SDOH Screener          Do you have trouble getting transportation to medical appointments?: No          Do you need help with Transportation to medical appointments?: Not on file          Patient indicated no issues from the most recent SDOH questionnaire: Not on file  Recent Concern: Transportation Needs - Unmet Transportation Needs (06/02/2023)      Received from [facility]      PRAPARE - Transportation          Lack of Transportation (Medical): Yes          Lack of Transportation (Non-Medical): Yes  Physical Activity: Not on file  Stress: No Stress Concern Present (05/14/2023)      Received from [facility] - Occupational Stress Questionnaire          Feeling of Stress : Only a little  Social Connections: Unknown (07/30/2019)      Received from [facility]      Social Connections          11. How often do you see or talk to people that you care about and feel close to? (Talking to friends on the phone or virtually, visiting friends/family, religious service, clubs): Not on file  Intimate Partner Violence: Unknown (07/03/2023)      Received from [facility]      Humiliation, Afraid, Rape, and Kick questionnaire          Fear of Current or Ex-Partner: No          Emotionally Abused: Not on file          Physically Abused: Not on file          Sexually Abused: Not on file  Housing Stability: Low Risk  (07/06/2023)      Received from [facility]      Housing Stability - SDOH Screener  What is your living situation today?: Steady housing          Do you need help with Housing/Shelter resources? : Not on file          Patient indicated no issues from the most recent SDOH questionnaire: Not on file          Homeless diagnosis active in problem list or in an  encounter in the past year?: Not on file  Recent Concern: Housing Stability - High Risk (06/02/2023)      Received from [facility]      Housing Stability Vital Sign          Unable to Pay for Housing in the Last Year: Yes          Number of Times Moved in the Last Year: Not on file          Homeless in the Last Year: Yes   Allergies: Review of Patient's Allergies indicates:   Ketorolac trometham*    Hives   Morphine and codeine    Anaphylaxis   Nitroglycerin           Hives, Other (See Comments)    Comment:"syncope and hives"             Patient states "passess out"             States drops his BP "too much"   Morphine sulfate-na*        Comment:Pt. States can take dilaudid and other narcotics   Motrin [ibuprofen]      Hives   Oxycodone               Other (See Comments)    Comment:States makes him feel funny   Toradol [ketorolac *    Other (See Comments)   Tramadol                Other (See Comments)   Nitrogen                Hives  Immunizations:   There is no immunization history on file for this patient.       Medications:  Prior to Admission Medications   Prescriptions Last Dose Informant Patient Reported? Taking?   ASPIRIN 81 MG OR TABS   Yes No   Sig: daily   LIPITOR 20 MG OR TABS   Yes No   Sig: daily   METOPROLOL TARTRATE OR   Yes No   Sig: None Entered      Facility-Administered Medications: None     Physical Exam:   Patient Vitals for the past 99 hrs:   BP Temp Temp src Pulse Resp SpO2 Weight   07/06/23 2218 (!) 157/107 98.3 F Oral 78 18 97 % --   07/06/23 2214 -- -- -- -- -- -- 115.7 kg (255 lb)     Physical Exam  Vitals and nursing note reviewed.   Constitutional:       General: He is not in acute distress.     Appearance: Normal appearance. He is normal weight. He is not ill-appearing or toxic-appearing.   Cardiovascular:      Rate and Rhythm: Normal rate and regular rhythm.      Pulses: Normal pulses.      Heart sounds: Normal heart sounds. No murmur heard.     No friction rub. No  gallop.   Pulmonary:      Effort: Pulmonary effort is normal. No respiratory distress.  Breath sounds: Normal breath sounds. No stridor. No wheezing or rhonchi.   Chest:      Chest wall: No tenderness.   Abdominal:      General: Abdomen is flat.      Tenderness: There is no abdominal tenderness.   Musculoskeletal:      Cervical back: Normal range of motion and neck supple. No rigidity or tenderness.   Lymphadenopathy:      Cervical: No cervical adenopathy.   Neurological:      Mental Status: He is alert.       Medications Given in the ED:  Medications - No data to display Radiology Results:  Wet read on chest x-ray showed no consolidation   Lab Results (abnormal results only):  Labs Reviewed   CBC, PLATELET & DIFFERENTIAL   COMPREHENSIVE METABOLIC PANEL   LIPASE   TROPONIN T HS BASELINE   RESPIRATORY PANEL BASIC INPAT    Other Results/Old Record review (e.g. ECG):  N/A     ED Course and Medical Decision-making:    48 year old male patient with acute substernal chest pain that occurred about 30 minutes ago with radiation down the left arm and into the left jaw with associated nausea.    Vital signs significant for blood pressure 157/107 otherwise unremarkable, patient is alert and oriented in no acute distress resting comfortably on the stretcher currently requesting a blanket at this time and strong pain medication at this time.  Physical exam reassuring, no specific findings, radial pulses 2+ bilaterally, no evidence of lower extremity edema, dorsalis pedis pulses 2+ bilaterally.  Lungs clear to auscultation.  Comprehensive workup including CBC, CMP, lipase, troponins, respiratory panel and chest x-ray.    Extensive chart review patient has been seen multiple times for similar complaint, most recently he was admitted last night at [facility] and discharged around 2 PM today, previously evaluated on 07/02/2023 at the [facility] emergency department and threatened to leave fevers not giving  narcotics.  On 07/01/2023 patient was evaluated at [facility] for similar complaint.  On 06/13/2023 was seen at [facility] and given IV fentanyl and morphine, admitted inpatient where he became very displeased with pain management.    On reassessment, patient displeased with care, requesting Dilaudid he was offered Tylenol.  Laboratory studies independently evaluated by myself are without significant organ damage or severe metabolic derangement.  Initial troponin was 14 still pending 1 hour 3-hour troponins.  EKG was interpreted by myself please see ED course.  Patient is currently requesting to leave AMA.  He is refusing any further care at this time.  ER attending Dr. Jay Schlichter was made aware.  Risk were discussed at length with the patient alongside RN charge nurse.  Patient was able to ambulate out of the emergency department without any difficulties.    ED Course as of 07/06/23 2251   Sat Jul 06, 2023   2228 EKG interpreted by myself shows a normal sinus rhythm ventricular rate of 72 beats per minute, mild ST elevation in leads III and aVF without reciprocal changes.  Normal intervals.  This is compared to a prior in June 2023   2251 TROPONIN T HS BASELINE: 14     Patient/family educated on diagnosis(es); he states understanding and agrees with plan of care.  Reasons to return to the ED were reviewed in detail. He agrees with this plan and disposition.    Patient LEFT AMA    Diagnosis/Diagnoses:  No diagnosis found.  Discharge Prescriptions:  New Prescriptions    No medications on file     Midge Minium, PA-C  This Emergency Department patient encounter note was created using voice-recognition software and in real time during the ED visit.

## 2023-07-06 NOTE — ED Notes (Signed)
 Bed: 11-A  Expected date:   Expected time:   Means of arrival:   Comments:  EMS CP

## 2023-07-06 NOTE — Narrator Note (Signed)
[place] PA-C and Consulting civil engineer at patients bedside. Risk vs benefit discussion had with patient. He continues to state he "wants to leave and states he ain signing no paperwork". IV removed . Patient encouraged to seek medical attention for any concerning symptoms. Patient verbalized understanding. Patient ambulated from ED with steady gait and without need for assistance.

## 2023-07-06 NOTE — ED Triage Note (Signed)
Patient arrives via EMS for " intense" substernal chest pain radiating to L arm accompanied by jaw pain and nausea. Patient has a history of three myocardial infarctions and two CABG. Patient denies SOB. Patient received 325 mg aspirin en route.

## 2023-07-07 LAB — CMP (EXT)
ALT/SGPT (EXT): 73 U/L — ABNORMAL HIGH (ref 12–45)
AST/SGOT (EXT): 45 U/L — ABNORMAL HIGH (ref 8–34)
Albumin (EXT): 4.4 g/dL (ref 3.4–5.2)
Alkaline Phosphatase (EXT): 66 U/L (ref 45–117)
Anion Gap (EXT): 11 mmol/L (ref 10–22)
BUN (EXT): 15 mg/dL (ref 7–18)
Bilirubin, Total (EXT): 0.3 mg/dL (ref 0.2–1.0)
CO2 (EXT): 27 mmol/L (ref 21–32)
CalciumCalcium (EXT): 10 mg/dL (ref 8.5–10.5)
Chloride (EXT): 103 mmol/L (ref 98–107)
Creatinine (EXT): 0.9 mg/dL (ref 0.7–1.2)
Glucose (EXT): 162 mg/dL — ABNORMAL HIGH (ref 74–160)
Potassium (EXT): 4.2 mmol/L (ref 3.5–5.1)
Protein (EXT): 7.7 g/dL (ref 6.4–8.2)
Sodium (EXT): 141 mmol/L (ref 136–145)
eGFR - Creat CKD-EPI (EXT): >60 mL/min (ref >60)

## 2023-07-07 LAB — RESPIRATORY PANEL BASIC INPAT
INFLUENZA A: NEGATIVE
INFLUENZA B: NEGATIVE
RESPIRATORY SYNCYTIAL VIRUS: NEGATIVE
SARS-COV-2: NEGATIVE

## 2023-07-07 LAB — EKG

## 2023-07-11 NOTE — Unmapped (Signed)
 Formatting of this note might be different from the original.  MS Chest pain down the left arm  and radiation to jaw.  Pain 9/10 at this time after asa and fentanyl  Electronically signed by Kirke Jolly, RN at 07/11/2023  7:34 AM PST

## 2023-07-11 NOTE — ED Notes (Signed)
 Formatting of this note might be different from the original.  Pt refusing hospital gown, states it makes him itchy. Pt given pillow and belongings placed in bag.   Electronically signed by Adrianna Haws, RN at 07/11/2023  8:16 AM PST

## 2023-07-11 NOTE — ED Provider Notes (Signed)
 Formatting of this note is different from the original.  [facility] EMERGENCY  [address]  [place] [address]  [phone]    History  Chief Complaint   Patient presents with    Chest Pain     48 year old male with a past medical history of MI (s/p CABG x2 2007 and 2012), unstable angina pectoris, T2DM, HTN, HLD, HCV, and external iliac artery occlusion presenting to the emergency department's today via EMS with a chief complaint of chest pain.  Patient states approximately 1-2 hours prior to arrival he began experiencing sudden onset sharp stabbing left-sided chest pain radiating to the left side of his jaw in his left arm with the associated nausea and lightheadedness.  Patient was seen at [facility] emergency department's last night for similar complaints and was ultimately discharged home in stable condition after a normal workup, but he states that his chest pain returned or severely.  Patient does not yet have a cardiologist as he recently moved to this area.  Per EMS, patient was given ASA 324 mg and 100 mcg fentanyl with no improvements in his symptoms.  Patient states he has a an allergic reaction to nitroglycerin and can not have this medication.  Denies any fevers, chills, headaches, visual changes, shortness for breath, abdominal pain, vomiting, diarrhea, numbness, tingling, lower extremity pain or swelling.    History provided by:  Patient  Language interpreter used: No      Past Medical History:   Diagnosis Date    Coronary artery disease     Hypercholesteremia     Hypercholesterolemia     Hypertension     MI (myocardial infarction) (CMS/HCC)     x3     HISTORY PROVIDED BY: patient, EMR    Past Surgical History:   Procedure Laterality Date    BYPASS GRAFT      x 2    CHOLECYSTECTOMY       Social History     Tobacco Use    Smoking status: Every Day     Current packs/day: 0.50     Types: Cigarettes   Substance Use Topics    Alcohol use: Never     Comment: social    Drug use: No     Review of Systems    Constitutional:  Negative for chills, diaphoresis, fatigue and fever.   Eyes:  Negative for visual disturbance.   Respiratory:  Negative for shortness of breath.    Cardiovascular:  Positive for chest pain. Negative for palpitations and leg swelling.   Gastrointestinal:  Positive for nausea. Negative for abdominal pain, constipation, diarrhea and vomiting.   Musculoskeletal:  Negative for arthralgias and myalgias.   Skin:  Negative for color change and rash.   Neurological:  Positive for light-headedness. Negative for dizziness, numbness and headaches.   Psychiatric/Behavioral:  Negative for behavioral problems and confusion. The patient is not nervous/anxious.      Physical Exam  BP (!) 148/75   Pulse 70   Temp 98.8 F (37.1 C) (Oral)   Resp 20   Ht 6' 4 (1.93 m)   Wt 116 kg (255 lb)   SpO2 92%   BMI 31.04 kg/m     Physical Exam  Vitals and nursing note reviewed.   Constitutional:       General: He is not in acute distress.     Appearance: He is well-developed and normal weight. He is not ill-appearing, toxic-appearing or diaphoretic.   HENT:      Head:  Normocephalic.   Cardiovascular:      Rate and Rhythm: Normal rate and regular rhythm.      Heart sounds: Normal heart sounds.   Pulmonary:      Effort: Pulmonary effort is normal. No respiratory distress.      Breath sounds: Normal breath sounds.   Musculoskeletal:         General: Normal range of motion.      Right lower leg: No tenderness. No edema.      Left lower leg: No tenderness. No edema.   Skin:     General: Skin is warm and dry.      Capillary Refill: Capillary refill takes less than 2 seconds.   Neurological:      General: No focal deficit present.      Mental Status: He is alert and oriented to person, place, and time.   Psychiatric:         Mood and Affect: Mood normal.         Behavior: Behavior normal.     Results  Labs Reviewed   CBC W/ AUTO DIFF - Abnormal       Result Value    WBC 5.67      RBC 4.38      Hemoglobin 13.3 (*)      Hematocrit 40.0      MCV 91.3      MCH 30.4      MCHC 33.3 (*)     RDW 12.8      Platelets 219      MPV 10.1      NRBC 0.0      Neutrophils (Relative) 55.7      Lymphocytes (Relative) 33.2      Monocytes (Relative) 8.1      Eosinophils (Relative) 2.1      Basophils (Relative) 0.4      Immature Granulocytes (Relative) 0.5      Neutrophils (Absolute) 3.2      Lymphocytes (Absolute) 1.9      Monocytes (Absolute) 0.5      Eosinophils (Absolute) 0.1      Basophils (Absolute) 0.0      Immature Granulocytes (Absolute) 0.0     APTT - Abnormal    PTT 25.3 (*)     Narrative:     Population Mean 28 sec.   BASIC METABOLIC PANEL - Abnormal    Sodium 139      Potassium 3.5      Chloride 102      CO2 28.5      BUN 16.0      Creatinine 0.98      Glucose 228 (*)     Calcium 8.9      Glomerular Filtration Rate >90.0      Anion Gap 8.5      Narrative:      Thresholds for diagnosing kidney disease are based on the results of   the predicted GFR. The MDRD equation is more accurate as a means to   estimate GFR and is a better predictor of Chronic Kidney Disease (CKD)   than the traditional creatinine clearance in a 24 hour urine specimen.     Classifications of Chronic Kidney Disease   GFR Stage Description     >=90 1 Kidney damage w/normal or elevated GFR   60-89 2 Kidney damage w/mild or decreased GFR   30-59 3 Moderately decreased GFR   15-29 4 Severely decreased GFR   <15 5 Kidney failure  PROTIME-INR - Normal    Protime 11.3      INR 1.04      Narrative:     Clinical Situation                       INR Target      Mechanical prosthetic heart valves       2.5-3.5  Prevention of systemic embolism from     2.0-3.0       Acute myocardila infarction       Valvular heart disease       Atrial fibrilation  Pulmonary embolism treatment             2.0-3.0  Venous thrombosis treatment              2.0-3.0   TROPONIN I - Normal    HS Troponin I 50      Narrative:     High-sensitivity Troponin I (hsTnI) assay can reliably detect low  troponin concentrations relative to conventional troponin assays. It is the preferred marker of myocardial necrosis as recommended by the Fourth Universal Definition of Myocardial Infarction Guidelines.    The diagnosis of acute myocardial infarction (AMI) is made based on a rise or fall of troponin with at least one measurement exceeding the laboratory's upper limit of normal(indicating myocardial injury), in the context of reasonable suspicion for coronary ischemia (e.g. typical symptoms, changes on ECG, evidence for loss of myocardial function or demonstration of obstructive coronary artery disease).    NOTE: Although abnormal hsTnI values reflect INJURY to myocardial cells, an elevated hsTnI does not indicate the CAUSE of injury (i.e. ischemia versus non-ischemic disease).    In some cases, myocardial injury is chronic and relatively stable such that hsTnI values remain elevated but do not change substantially over hours to days (examples include chronic kidney disease, heart failure or advanced patient age).    When determining whether there has been a significant rise or fall of troponin on serial sampling, absolute change in troponin concentration has greater diagnostic accuracy for AMI than relative change criteria. A change of >7 ng/L over a 2-hour interval or a change of >10 ng/L over a 3-hour interval is suggested as a significant change.  If the initial hsTnI is below or slightly above the 99th percentile upper reference limit, a 50% change from the baseline is considered significant.  If the initial hsTnI is above the 99th percentile upper reference limit, a 20% change from the baseline value is considered significant.   NT-PROBRAIN NATRIURETIC PEPTIDE - Normal    NT-proBNP 49      Narrative:     BNP Interpretation:    Among patients with dyspnea, BNP is highly sensitive for the  detection of acute congestive heart failure. In addition, a BNP  <300 pg/ml effectively rules out acute congestive heart  failure, with  99% negative predictive value.    Knowledge of each individual patient's BNP range may be more  useful than using similar cut-points for every patient.  Marked elevations in BNP levels may be observed in states other  than left ventricular congestive failure, including: acute coronary  syndromes, right heart strain/failure (including pulmonary embolism and  cor pulmonale), critical illness, renal failure, as well as advanced  age.    Falsely low BNP in congestive heart failure patients may be  observed with increasing body--mass index.   TROPONIN I    HS Troponin I 46      HS  Troponin I % Change -8      HS Troponin I ABS Change -4      Narrative:     High-sensitivity Troponin I (hsTnI) assay can reliably detect low troponin concentrations relative to conventional troponin assays. It is the preferred marker of myocardial necrosis as recommended by the Fourth Universal Definition of Myocardial Infarction Guidelines.    The diagnosis of acute myocardial infarction (AMI) is made based on a rise or fall of troponin with at least one measurement exceeding the laboratory's upper limit of normal(indicating myocardial injury), in the context of reasonable suspicion for coronary ischemia (e.g. typical symptoms, changes on ECG, evidence for loss of myocardial function or demonstration of obstructive coronary artery disease).    NOTE: Although abnormal hsTnI values reflect INJURY to myocardial cells, an elevated hsTnI does not indicate the CAUSE of injury (i.e. ischemia versus non-ischemic disease).    In some cases, myocardial injury is chronic and relatively stable such that hsTnI values remain elevated but do not change substantially over hours to days (examples include chronic kidney disease, heart failure or advanced patient age).    When determining whether there has been a significant rise or fall of troponin on serial sampling, absolute change in troponin concentration has greater diagnostic accuracy for  AMI than relative change criteria. A change of >7 ng/L over a 2-hour interval or a change of >10 ng/L over a 3-hour interval is suggested as a significant change.  If the initial hsTnI is below or slightly above the 99th percentile upper reference limit, a 50% change from the baseline is considered significant.  If the initial hsTnI is above the 99th percentile upper reference limit, a 20% change from the baseline value is considered significant.     Imaging  Imaging Results           X-ray Chest 2 View (Final result)  Result time 07/11/23 12:01:11      Final result by Verlie Lipps, MD (07/11/23 12:01:11)              Impression:    No acute chest findings    Electronically signed: 07/11/2023 12:01 PM Verlie Lipps, M.D.            Narrative:    XR CHEST 2 VIEW    HISTORY: Chest Pain    TECHNIQUE: Frontal and lateral views of the chest.    COMPARISON: April 2024    FINDINGS:  Lines/tubes: None.  Lungs: The lungs are well inflated and clear. There is no evidence of pneumonia or pulmonary edema.  Pleura: There is no pleural effusion or pneumothorax.  Heart and mediastinum: The heart and the mediastinum are normal.  Bones: No significant abnormality.                          ED Course      Procedures    MDM  Number of Diagnoses or Management Options  Unstable angina (CMS/HCC)  Diagnosis management comments: 48 year old male with a past medical history of MI (s/p CABG x2 2007 and 2012), unstable angina pectoris, T2DM, HTN, HLD, HCV, and external iliac artery occlusion presenting to the emergency department's today via EMS with a chief complaint of chest pain. Nursing notes reviewed, patient seen and evaluated by me. On physical exam, patient in NAD, heart is RRR, lungs are CTAB bilaterally, abdomen is soft, non-tender, non-distended, patient is alert and oriented x4, no leg swelling. Diagnoses considered:  ACS, cardiac arrhythmia,  valvular abnormality, electrolyte abnormality, pneumonia, pneumothorax, GERD, anxiety.  CBC,  BMP, troponin, coags, NT BNP, ECG, CXR ordered.  IV fluids, Dilaudid  0.5 mg IV, Zofran administered after initial evaluation. Will re-evaluate the patient's condition after results are reviewed.    **I independently reviewed all labs, EKGs and x-rays.  My interpretations are reflected in the MDM portion of my note.**    ECG interpreted by this provider shows rate 69 with sinus rhythm, left atrial enlargement, PR 183, QRS 103, QTC 423, no evidence of any ST elevations or depressions, no significant changes from prior ECG. Negative troponin x 2. CXR shows no acute abnormalities. Labs unremarkable. Patient's HEART score is 4. Wells score is 0. Patient states his pain has improved but is still present.    Per chart review patient has had multiple ED visits at multiple different hospitals for chest pain and has had a reassuring workup. His most recent ED visit was last night at [facility] and was discharged home in stable condition with a follow up appointment with his cardiologist scheduled for 07/18/23.    Patient has been to 5 different EDs in the past 2 months for similar symptoms with negative troponins and normal cardiac workups including at [facility] 07/06/23, [name] 07/02/23, [facility] 07/01/23 and 06/19/23, and [facility] 06/14/23, as well as [facility] 07/11/23.    06/01/23 NM Myocardial perfusion spect: Small area of mild-to-moderately decreased perfusion involving the inferior apex on rest images. Attenuation corrected images were not acquired. Stress images were not acquired therefore cannot assess whether or not the defect represents a fixed defect or may be related to artifact.  Patient had an echocardiogram 5 days which showed Limited study for wall motion analysis with ongoing chest pain. Left ventricle: Normal size and systolic function, moderate concentric hypertrophy. No segmental wall motion abnormalities appreciated. Right ventricle: Normal size and systolic function Mild MR No change compared to  previous study report, November before. Discussed case with admitting team resident Dr. Toya and attending ED Dr. Ladena with recommendations for outpatient follow up with his cardiologist - no inpatient workup necessary at this time.    Patient is to be discharged to follow up outpatient with cardiologist for further management of his unstable angina. He understands plan after discharge and agrees. Provided with proper discharge instructions with proper referral. He understands the diagnosis, reasons to return to the ED, and the importance of outpatient follow-up. Provided with proper instructions to return to the emergency department/call 911 for any new, worsening, or continued symptoms. All questions were answered to satisfaction and the patient was discharged in stable condition.    Quality Measures    Medication Review  I have reviewed the patient's medications as documented in the EMR and verified that they are accurate/up-to-date with the patient/guardian and outside records.    Blood Pressure  The patient is already diagnosed with HTN and being managed    Patient was ambulated prior to discharge and tolerated this well.      Amount and/or Complexity of Data Reviewed  Clinical lab tests: ordered and reviewed  Tests in the radiology section of CPT: ordered and reviewed  Discuss the patient with other providers: yes    ED Disposition  Discharge    Final diagnoses:   Unstable angina (CMS/HCC)      Follow-up Information       Schedule an appointment as soon as possible for a visit  with Fairy MYRTIS Pellegrini, MD.    Specialty: Cardiology  Contact information  [address]  [address]  [place] [address]  [phone]                      Toribio DELENA Cotton, PA-C  07/11/23 1446    Electronically signed by Oliva JONELLE Chimes, MD at 07/19/2023  7:47 AM PST    Associated attestation - Chimes Oliva JONELLE, MD - 07/19/2023  7:47 AM PST  Formatting of this note might be different from the original.  I was  available for consultation during this patient's visit. I have read and reviewed the note and agree with the plan as described.

## 2023-07-12 LAB — BMP (EXT)
Anion Gap (EXT): 10 (ref 5–15)
BUN (EXT): 16 mg/dL (ref 7–23)
CO2 (EXT): 25 mmol/L (ref 22–32)
CalciumCalcium (EXT): 9.2 mg/dL (ref 8.6–10.5)
Chloride (EXT): 104 mmol/L (ref 98–107)
Creatinine (EXT): 0.81 mg/dL (ref 0.60–1.30)
Glucose (EXT): 184 mg/dL — ABNORMAL HIGH (ref 65–99)
Potassium (EXT): 4.1 mmol/L (ref 3.5–5.3)
Sodium (EXT): 139 mmol/L (ref 135–145)
eGFR - Creat CKD-EPI (EXT): >90 mL/min/{1.73_m2} (ref >=60)

## 2023-08-04 ENCOUNTER — Emergency Department: Admit: 2023-08-04 | Payer: MEDICAID

## 2023-08-04 ENCOUNTER — Observation Stay
Admission: EM | Admit: 2023-08-04 | Discharge: 2023-08-05 | Discharge status: Against Medical Advice | Payer: MEDICAID | Admitting: Internal Medicine

## 2023-08-04 DIAGNOSIS — I25110 Atherosclerotic heart disease of native coronary artery with unstable angina pectoris: Principal | ICD-10-CM

## 2023-08-04 DIAGNOSIS — R079 Chest pain, unspecified: Secondary | ICD-10-CM

## 2023-08-04 LAB — CBC WITH DIFFERENTIAL
Basophils %: 0.6 %
Basophils Absolute: 0.02 10*3/uL (ref 0.00–0.22)
Eosinophils %: 0.9 %
Eosinophils Absolute: 0.03 10*3/uL (ref 0.00–0.50)
Hematocrit: 45.9 % (ref 32.0–53.0)
Hemoglobin: 15.2 g/dL (ref 13.0–17.5)
Immature Granulocytes %: 1.2 %
Immature Granulocytes Absolute: 0.04 10*3/uL (ref 0.00–0.10)
Lymphocyte %: 21.6 %
Lymphocytes Absolute: 0.71 10*3/uL (ref 0.70–4.00)
MCH: 29.3 pg (ref 26.0–34.0)
MCHC: 33.1 g/dL (ref 31.0–37.0)
MCV: 88.6 fL (ref 80.0–100.0)
MPV: 10.1 fL (ref 9.1–12.4)
Monocytes %: 12.5 %
Monocytes Absolute: 0.41 10*3/uL (ref 0.36–0.83)
NRBC %: 0 % (ref 0.0–0.0)
NRBC Absolute: 0 10*3/uL (ref 0.00–2.00)
Neutrophil %: 63.2 %
Neutrophils Absolute: 2.07 10*3/uL (ref 1.50–7.95)
Platelets: 152 10*3/uL (ref 150–400)
RBC: 5.18 M/uL (ref 4.20–5.90)
RDW-CV: 12.6 % (ref 11.5–14.5)
RDW-SD: 41.3 fL (ref 35.0–51.0)
WBC: 3.3 10*3/uL — ABNORMAL LOW (ref 4.0–11.0)

## 2023-08-04 LAB — TROPONIN T, HIGH SENSITIVITY
Troponin T, High Sensitivity: 15 ng/L — ABNORMAL HIGH (ref <12)
Troponin T, High Sensitivity: 13 ng/L — ABNORMAL HIGH (ref <12)

## 2023-08-04 LAB — NT PRO BNP: NT-proBNP: 55 pg/mL (ref 0–125)

## 2023-08-04 LAB — COMPREHENSIVE METABOLIC PANEL
ALT: 77 U/L — ABNORMAL HIGH (ref 0–55)
AST: 79 U/L — ABNORMAL HIGH (ref 6–42)
Albumin: 3.9 g/dL (ref 3.2–5.0)
Alkaline phosphatase: 60 U/L (ref 30–130)
Anion Gap: 15 mmol/L — ABNORMAL HIGH (ref 3–14)
BUN: 16 mg/dL (ref 6–24)
Bilirubin, total: 0.7 mg/dL (ref 0.2–1.2)
CO2 (Bicarbonate): 22 mmol/L (ref 20–32)
Calcium: 9.3 mg/dL (ref 8.5–10.5)
Chloride: 98 mmol/L (ref 98–110)
Creatinine: 1.3 mg/dL (ref 0.55–1.30)
Glucose: 225 mg/dL — ABNORMAL HIGH (ref 70–139)
Potassium: 3.8 mmol/L (ref 3.6–5.2)
Protein, total: 7.8 g/dL (ref 6.0–8.4)
Sodium: 135 mmol/L (ref 135–146)
eGFRcr: 69 mL/min/{1.73_m2} (ref >=60)

## 2023-08-04 LAB — POCT GLUCOSE: POCT Glucose: 267 mg/dL — ABNORMAL HIGH (ref 70–139)

## 2023-08-04 LAB — PROTIME-INR
INR: 1.17
Protime: 13.5 s (ref 9.7–14.0)

## 2023-08-04 LAB — PTT: aPTT: 28 s (ref 25.7–35.7)

## 2023-08-04 LAB — TSH WITH REFLEX: TSH: 0.38 u[IU]/mL (ref 0.27–4.94)

## 2023-08-04 LAB — FIBRINOGEN: Fibrinogen: 499 mg/dL — ABNORMAL HIGH (ref 170–440)

## 2023-08-04 LAB — D-DIMER, QUANTITATIVE: D-DIMER: 7270 ng{FEU}/mL — ABNORMAL HIGH (ref 215.00–500.00)

## 2023-08-04 LAB — C-REACTIVE PROTEIN: CRP: 18.7 mg/L — ABNORMAL HIGH (ref 0.00–4.99)

## 2023-08-04 MED ORDER — metoprolol succinate XL (Toprol-XL) 24 hr tablet 25 mg
25 | Freq: Two times a day (BID) | ORAL | Status: DC
Start: 2023-08-04 — End: 2023-08-04

## 2023-08-04 MED ORDER — sodium chloride 0.9 % bolus 1,000 mL
0.9 | Freq: Once | INTRAVENOUS | Status: AC
Start: 2023-08-04 — End: 2023-08-04
  Administered 2023-08-04: 17:00:00 1000 mL via INTRAVENOUS

## 2023-08-04 MED ORDER — heparin (porcine) injection 5,000 Units
5000 | Freq: Three times a day (TID) | INTRAMUSCULAR | Status: DC
Start: 2023-08-04 — End: 2023-08-04

## 2023-08-04 MED ORDER — atorvastatin (Lipitor) tablet 80 mg
80 | Freq: Every day | ORAL | Status: DC
Start: 2023-08-04 — End: 2023-08-04

## 2023-08-04 MED ORDER — iopamidol (Isovue-370) 370 mg iodine /mL (76 %) injection 70 mL
370 | Freq: Once | INTRAVENOUS | Status: AC
Start: 2023-08-04 — End: 2023-08-04
  Administered 2023-08-04: 18:00:00 70 mL via INTRAVENOUS

## 2023-08-04 MED ORDER — fentaNYL (Sublimaze) injection 50 mcg
50 | Freq: Once | INTRAMUSCULAR | Status: AC
Start: 2023-08-04 — End: 2023-08-04
  Administered 2023-08-04: 17:00:00 50 ug via INTRAVENOUS

## 2023-08-04 MED ORDER — oxyCODONE (Roxicodone) solution 5 mg
5 | ORAL | Status: DC | PRN
Start: 2023-08-04 — End: 2023-08-04

## 2023-08-04 MED ORDER — glucagon injection 1 mg
1 | INTRAMUSCULAR | Status: DC | PRN
Start: 2023-08-04 — End: 2023-08-04

## 2023-08-04 MED ORDER — naloxone (Narcan) injection 0.1 mg
0.4 | INTRAMUSCULAR | Status: DC | PRN
Start: 2023-08-04 — End: 2023-08-04

## 2023-08-04 MED ORDER — insulin lispro (HumaLOG) injection 0-5 Units
100 | Freq: Three times a day (TID) | SUBCUTANEOUS | Status: DC
Start: 2023-08-04 — End: 2023-08-04
  Administered 2023-08-04: 23:00:00 3 [IU] via SUBCUTANEOUS

## 2023-08-04 MED ORDER — morphine injection 4 mg
4 | Freq: Once | INTRAVENOUS | Status: AC
Start: 2023-08-04 — End: 2023-08-04
  Administered 2023-08-04: 18:00:00 4 mg via INTRAVENOUS

## 2023-08-04 MED ORDER — dextrose 50 % in water (D50W) solution 50 mL
INTRAVENOUS | Status: DC | PRN
Start: 2023-08-04 — End: 2023-08-04

## 2023-08-04 MED ORDER — insulin glargine (Lantus) injection 10 Units
100 | Freq: Every evening | SUBCUTANEOUS | Status: DC
Start: 2023-08-04 — End: 2023-08-04

## 2023-08-04 MED ORDER — morphine injection 4 mg
4 | Freq: Once | INTRAVENOUS | Status: AC
Start: 2023-08-04 — End: 2023-08-04
  Administered 2023-08-04: 23:00:00 4 mg via INTRAVENOUS

## 2023-08-04 MED ORDER — acetaminophen (Tylenol) solution 650 mg
325 | Freq: Four times a day (QID) | ORAL | Status: DC | PRN
Start: 2023-08-04 — End: 2023-08-04

## 2023-08-04 MED ORDER — acetaminophen (Tylenol) tablet 650 mg
325 | Freq: Four times a day (QID) | ORAL | Status: DC | PRN
Start: 2023-08-04 — End: 2023-08-04
  Administered 2023-08-04: 22:00:00 650 mg via ORAL

## 2023-08-04 MED ORDER — insulin lispro (HumaLOG) injection 0-4 Units
100 | Freq: Every evening | SUBCUTANEOUS | Status: DC
Start: 2023-08-04 — End: 2023-08-04

## 2023-08-04 MED ORDER — diphenhydrAMINE (BENADryl) injection 25 mg
50 | Freq: Once | INTRAMUSCULAR | Status: AC
Start: 2023-08-04 — End: 2023-08-04
  Administered 2023-08-04: 18:00:00 25 mg via INTRAVENOUS

## 2023-08-04 MED ORDER — dextrose (D10W) 10 % bolus 250 mL
10 | INTRAVENOUS | Status: DC | PRN
Start: 2023-08-04 — End: 2023-08-04

## 2023-08-04 MED ORDER — lidocaine 4 % patch 1 patch
4 | Freq: Once | TOPICAL | Status: DC
Start: 2023-08-04 — End: 2023-08-04
  Administered 2023-08-04: 18:00:00 1 via TRANSDERMAL

## 2023-08-04 MED ORDER — oxyCODONE (Roxicodone) immediate release tablet 5 mg
5 | ORAL | Status: DC | PRN
Start: 2023-08-04 — End: 2023-08-04
  Administered 2023-08-04: 22:00:00 5 mg via ORAL

## 2023-08-04 MED ORDER — ondansetron (Zofran) injection 4 mg
4 | Freq: Once | INTRAMUSCULAR | Status: AC
Start: 2023-08-04 — End: 2023-08-04
  Administered 2023-08-04: 17:00:00 4 mg via INTRAVENOUS

## 2023-08-04 MED ORDER — acetaminophen (Tylenol) suppository 650 mg
650 | Freq: Four times a day (QID) | RECTAL | Status: DC | PRN
Start: 2023-08-04 — End: 2023-08-04

## 2023-08-04 MED ORDER — polyethylene glycol (Glycolax) packet 17 g
17 | Freq: Every day | ORAL | Status: DC
Start: 2023-08-04 — End: 2023-08-04

## 2023-08-04 MED ORDER — dextrose (D10W) 10 % bolus 125 mL
10 | INTRAVENOUS | Status: DC | PRN
Start: 2023-08-04 — End: 2023-08-04

## 2023-08-04 MED ORDER — aspirin EC tablet 81 mg
81 | Freq: Every day | ORAL | Status: DC
Start: 2023-08-04 — End: 2023-08-04

## 2023-08-04 MED ORDER — dextrose 50 % in water (D50W) solution 25 mL
INTRAVENOUS | Status: DC | PRN
Start: 2023-08-04 — End: 2023-08-04

## 2023-08-04 MED FILL — MORPHINE 4 MG/ML INTRAVENOUS SOLUTION WRAPPER: 4 4 mg/mL | INTRAVENOUS | Qty: 1

## 2023-08-04 MED FILL — FENTANYL CITRATE (PF) 50 MCG/ML INJ SOLN WRAPPER: 50 50 mcg/mL | INTRAMUSCULAR | Qty: 2

## 2023-08-04 MED FILL — DIPHENHYDRAMINE 50 MG/ML INJECTION SOLUTION: 50 50 mg/mL | INTRAMUSCULAR | Qty: 1

## 2023-08-04 MED FILL — ACETAMINOPHEN 325 MG TABLET: 325 325 mg | ORAL | Qty: 2

## 2023-08-04 MED FILL — ONDANSETRON HCL (PF) 4 MG/2 ML INJECTION SOLUTION: 4 4 mg/2 mL | INTRAMUSCULAR | Qty: 2

## 2023-08-04 MED FILL — LIDOCAINE 4 % TOPICAL PATCH: 4 4 % | TOPICAL | Qty: 1

## 2023-08-04 MED FILL — INSULIN LISPRO (U-100) 100 UNIT/ML SUBCUTANEOUS SOLUTION: 100 100 unit/mL | SUBCUTANEOUS | Qty: 1200

## 2023-08-04 MED FILL — OXYCODONE 5 MG TABLET: 5 5 mg | ORAL | Qty: 1

## 2023-08-04 NOTE — Discharge Summary (Signed)
 Discharge Summary    Discharge Diagnosis  Chest pain, unspecified type    Hospital Course   Patient [name]  Date of Birth: 12/03/1975  MRN: [id number]  Discharge Provider:Kathleen Irving Copas, MD  Primary Care Physician:No PCP, Per Patient     Admission Date:08/04/2023 - 08/04/23     Patient Class: Inpatient    Items for outpatient follow-up:  [ ]  Establish with cardiology to obtain outpatient stress test    Medication changes as below:  None      Brief Summary:  [name] is a 48 y.o. 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 ED course notable for significant elevated D-dimer to 7700 with CTA unremarkable for PE.  Unclear etiology of D-dimer elevation as well as presyncope with plan for syncopal workup and possible stress test tomorrow however patient unfortunately left AMA on arrival to the floor.    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 Has had several ED visits in the past few months for similar symptoms. Pt story suggestive of possible pre-syncopal episode. Fibrinogen 499. D-Dimer 7,270. CRP 18.7. Troponin flat, 15 -> 13. BNP WNL. CTA Chest negative for PE. Given extensive cardiovascular history and classic chest pain will treat as unstable angina. Patient allergic to nitroglycerin, pain improving with Morphine.  Echo on 07/06/2023 at [facility] with normal EF of 65%, Moderate concentric left ventricular hypertrophy noted. No regional wall motion abnormalities noted. Prior left heart cath and nuclear stress testing with no evidence of significant CAD though did demonstrate mild irregularity at the RCA.  Planned for  telemetry monitoring for possible arrhythmogenic source of presyncope as well as exercise stress test for workup of his acute on chronic anginal pain with plan for cardiology consult and coordination for cardiology follow-up as an outpatient.  Unfortunately patient left AMA shortly after admission prior to ability to complete the studies or obtain further workup for D-dimer elevation.      HPI From Admission Copied for Reference:  [name], 48 y.o. 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      The pain started approximately 40 minutes before ambulance arrival @ ~11AM. Patient describes the pain as 8/10, 'stabbing' and similar to when he had a heart attack in 1998. Associated symptoms include sweating, nausea, and dizziness at time of episode. Denies SOB, room spinning/vertigo, fevers, chills, orthopnea, lower extremity pain/swelling, neck or back pain. Pain is not reproducible with palpation of the chest. He did not lose consciousness, but reported EMS had to catch him after standing up. He reports low blood pressure on EMS arrival ("80/0") and was initially hypotensive upon arrival at the hospital. Patient has had similar episodes in the past, with the last visit to the ED on Jan 29th for chest pain. He reports that he will be leaving if he is going to not get morphine/oxycodone as he can just be in pain at home if that were  going to be the case. He denies tylenol having any effect on his pain. He threw the lidocaine patch on the floor because it was not helping.      ED Course:   - Presenting vitals: afebrile, hemodynamically stable  - Labs: D-dimer elevated at 7,270, CRP 18.7. Glucose 225. AG 15.   - Imaging: CTA Chest: negative for PE. Similar distribution of diffuse patchy groundglass opacities  although with slight increased density compared to prior, findings are nonspecific and may be seen in setting of pulmonary hypertension in the proper clinical setting versus small airway disease   - Treatments: aspirin, benadryl, fentanyl, morphine x2, lidocaine patch, Zofran, 1L NS bolus      Relevant Imaging Findings:  CTA CHEST PULMONARY EMBOLISM   Final Result   1.  No evidence of acute or chronic pulmonary embolus.    2.  Similar distribution of diffuse patchy groundglass opacities although with slight increased density compared to prior, findings are nonspecific and may be seen in setting of pulmonary hypertension in the proper clinical setting versus small airway disease.      APPROVED BY STAFF RADIOLOGIST: Carolina Reveron-Arias 08/04/2023 2:12 PM EDT      XR CHEST PORTABLE   Final Result   No acute pulmonary abnormality.      DICTATED: Darra Lis   IN ACCORDANCE WITH DEPARTMENT POLICY, TEACHING PHYSICIANS REVIEW ALL IMAGES, AND EDIT REPORTS AS REQUIRED. A REPORT IS NOT FINAL UNTIL APPROVED BY A STAFF RADIOLOGIST.      APPROVED BY STAFF RADIOLOGIST: Bosie Clos, MD 08/04/2023 1:38 PM EDT      Stress test with myocardial perfusion    (Results Pending)       Medications on discharge:     Your medication list        ASK your doctor about these medications        Instructions Last Dose Given Next Dose Due   aspirin 81 mg capsule      Take 81 mg by mouth in the morning.       atorvastatin 10 mg tablet  Commonly known as: Lipitor      Take 80 mg by mouth in the morning.       insulin glargine 100 unit/mL (3 mL) injection  Commonly known as: Lantus      Inject 15 Units under the skin at bedtime.       metoprolol succinate XL 50 mg 24 hr tablet  Commonly known as: Toprol-XL      Take 25 mg by mouth twice daily.       Ventolin HFA 90 mcg/actuation inhaler  Generic drug: albuterol      Inhale 2 puffs every 6 (six) hours if needed.                Pertinent Physical Exam Findings  General: Patient well-appearing  in bed. NAD.  Neuro: A&Ox3. No focal neuro deficits.  HEENT: MMM. Sclera anicteric and uninjected.  Chest/Lungs: CTAB. No increased WOB. No wheezes, crackles, ronchi. Gynecomastia.   CV: RRR, normal S1/S2, no murmurs.  Abd: Soft, NT, ND, +bowel sounds.  Ext: Warm, well-perfused. No LE edema. No LE erythema, warmth, swelling, or tenderness, bilaterally.  Skin: No rashes over exposed areas.  Psych: Euthymic with normal affect.    Future Outpatient Appointments:  No future appointments.     ---  Carlis Stable, MD, MBA  Internal Medicine, PGY-2  [facility]

## 2023-08-04 NOTE — Nursing Note (Signed)
 Pt admitted to N8 @1900p  for CP and plan for stress test in AM. Pt at 2002p states he wants to sign out AMA. MD Sugerman at bedside. Pt still states he is leaving and his brother is picking him up downstairs. Pts IV removed. AMA paper signed with MD and placed in chart. Pt escorted to elevators.

## 2023-08-04 NOTE — ED Triage Notes (Signed)
 Pt a/ox3. Arrives via EMS. Reports x1 hour of L chest wall pain, sharp in nature, 6/10. Radiates into upper L arm. +Dizziness. EMS reports concern for orthostatic hypotension. HX of MI As a teen.

## 2023-08-04 NOTE — H&P (Signed)
 Time Medicine Admission History & Physical Note    PATIENT: [name]  MRN: [id number]   DATE OF ADMISSION: 08/04/2023  TIME: 6:43 PM  PCP: No PCP, Per Patient    CHIEF COMPLAINT:     Chief Complaint   Patient presents with    Chest Pain     HISTORY OF PRESENT ILLNESS:   [name], 48 y.o. 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     The pain started approximately 40 minutes before ambulance arrival @ ~11AM. Patient describes the pain as 8/10, 'stabbing' and similar to when he had a heart attack in 1998. Associated symptoms include sweating, nausea, and dizziness at time of episode. Denies SOB, room spinning/vertigo, fevers, chills, orthopnea, lower extremity pain/swelling, neck or back pain. Pain is not reproducible with palpation of the chest. He did not lose consciousness, but reported EMS had to catch him after standing up. He reports low blood pressure on EMS arrival ("80/0") and was initially hypotensive upon arrival at the hospital. Patient has had similar episodes in the past, with the last visit to the ED on Jan 29th for chest pain. He reports that he will be leaving if he is going to not get morphine/oxycodone as he can just be in pain at home if that were going to be the case. He denies tylenol having any effect on his pain. He threw the lidocaine patch on the floor because it was not helping.     ED Course:   - Presenting vitals: afebrile, hemodynamically stable  - Labs: D-dimer elevated at 7,270, CRP 18.7. Glucose 225. AG 15.   - Imaging: CTA Chest: negative for PE. Similar distribution of diffuse patchy groundglass opacities although with slight increased density compared to prior, findings are nonspecific and may be seen in setting of pulmonary hypertension in the proper clinical  setting versus small airway disease   - Treatments: aspirin, benadryl, fentanyl, morphine x2, lidocaine patch, Zofran, 1L NS bolus    Initial Vitals: 37.2 C (99 F)  HR 99  BP 108/77  RR 20   SpO2 96 % on room air.  Weight       Review of Systems:   Pertinent positive and negative as per HPI, otherwise a 10-point review of systems was negative.     PAST MEDICAL HISTORY:     Patient Active Problem List   Diagnosis    Chest pain    Hypokalemia    Chest pain, unspecified type    Type 2 diabetes mellitus (Multi-HCC)    Tobacco abuse    Obesity    Presence of aortocoronary bypass graft    PCR positive for hepatitis C virus    HLD (hyperlipidemia)    Essential hypertension    Congenital anomaly of artery (HHS-HCC)       PAST SURGICAL HISTORY:     Past Surgical History:   Procedure Laterality Date    ARTERIAL BYPASS SURGERY      x2    CHOLECYSTECTOMY      CORONARY ARTERY BYPASS GRAFT      CTA ABDOMEN PELVIS W AND WO CONTRAST  12/03/2022    CTA ABDOMEN PELVIS W AND WO CONTRAST 12/03/2022        FAMILY HISTORY:     Family History  Problem Relation [name] Age of Onset    Heart disease Father         SOCIAL HISTORY:     Social History     Tobacco Use    Smoking status: Every Day     Current packs/day: 0.50     Types: Cigarettes     Passive exposure: Current    Smokeless tobacco: Never   Vaping Use    Vaping status: Some Days    Substances: Nicotine   Substance Use Topics    Alcohol use: Never    Drug use: Never      Smoking: 1 cigarette per day currently for past 4 years, occasional vaping.  Alcohol: Denies  Drugs: Denies    PHYSICAL EXAM ON ADMISSION:   Vitals:   Temp:  [37.2 C (99 F)-37.4 C (99.4 F)] 37.4 C (99.4 F)  Pulse:  [75-99] 96  Resp:  [10-20] 15  SpO2:  [95 %-98 %] 96 %  BP: (102-134)/(58-96) 134/96   SpO2 96 % on room air.     Physical Examination:  General: Patient well-appearing in bed. NAD.  Neuro: A&Ox3. No focal neuro deficits.  HEENT: MMM. Sclera anicteric and uninjected.  Chest/Lungs: CTAB. No  increased WOB. No wheezes, crackles, ronchi. Gynecomastia.   CV: RRR, normal S1/S2, no murmurs.  Abd: Soft, NT, ND, +bowel sounds.  Ext: Warm, well-perfused. No LE edema. No LE erythema, warmth, swelling, or tenderness, bilaterally.  Skin: No rashes over exposed areas.  Psych: Euthymic with normal affect.    LAB RESULTS:   Labs reviewed in the chart, notable for:  Lab Results   Component Value Date    NA 135 08/04/2023    K 3.8 08/04/2023    CL 98 08/04/2023    CO2 22 08/04/2023    BUN 16 08/04/2023    CREATININE 1.30 08/04/2023     Lab Results   Component Value Date    WBC 3.3 (L) 08/04/2023    HGB 15.2 08/04/2023    HCT 45.9 08/04/2023    MCV 88.6 08/04/2023    PLT 152 08/04/2023     Lab Results   Component Value Date    ALT 77 (H) 08/04/2023    AST 79 (H) 08/04/2023    BILITOT 0.7 08/04/2023    PROT 7.8 08/04/2023    ALBUMIN 3.9 08/04/2023     Lab Results   Component Value Date    PT 13.5 08/04/2023    INR 1.17 08/04/2023       Micro reviewed in the chart, notable for:   Blood Cultures: No results found for: "BLOODCX"   Urine Cultures: No results found for: "URINECX"     IMAGING/RADIOLOGY/CARDIAC TESTING:   Imaging reviewed in the chart, notable for:  CTA Chest   IMPRESSION:  1.  No evidence of acute or chronic pulmonary embolus.   2.  Similar distribution of diffuse patchy groundglass opacities although with slight increased density compared to prior, findings are nonspecific and may be seen in setting of pulmonary hypertension in the proper clinical setting versus small airway disease.    XR CHEST  No acute pulmonary abnormality     EKG:     Encounter Date: 08/04/23   ECG 12 lead    Narrative                                 [facility]  Test Date:    2023-08-04  [name]:     [facility]          Department:   N1-ED  Patient ID:   [id number]                 Room:         A05  Gender:       Male                      Technician:   GY  DOB:          1976/05/06               Requested By: Konrad Penta  Order Number: [id number]                Reading MD:                                      Measurements  Intervals                              Axis            Rate:         94                       P:            51  PR:           154                      QRS:          27  QRSD:         106                      T:            91  QT:           351                                      QTc:          439                                                                 Interpretive Statements  Sinus rhythm  Probable left atrial enlargement  Borderline low voltage, extremity leads        Last TTE:    No echocardiogram results found for the past 12 months     HOME MEDICATIONS:   List confirmed with: patient or family member.  Current Outpatient Medications   Medication Instructions    aspirin 81 mg, oral, Daily RT    atorvastatin (LIPITOR) 80 mg, oral, Daily RT    insulin glargine (LANTUS) 15 Units, subcutaneous, Nightly    metoprolol succinate XL (TOPROL-XL) 25 mg, oral, 2 times daily    Ventolin HFA 90 mcg/actuation inhaler 2 puffs, Every 6  hours PRN        ASSESSMENT & PLAN:   [name] is a 48 y.o. 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 Has had several ED visits in the past few months for similar symptoms. Pt story suggestive of possible pre-syncopal episode. Fibrinogen 499. D-Dimer 7,270. CRP 18.7. Troponin flat, 15 -> 13. BNP WNL. CTA Chest negative for PE. Given extensive cardiovascular history  and classic chest pain will treat as unstable angina. Patient allergic to nitroglycerin, pain improving with Morphine.    Echo on 07/06/2023 at [facility] with normal EF of 65%, Moderate concentric left ventricular hypertrophy noted. No regional wall motion abnormalities noted. Prior left heart cath and nuclear stress testing with no evidence of significant CAD though did demonstrate mild irregularity at the RCA.   - S/p aspirin 324 mg PO  - Continue aspirin 81 mg daily  - Continue metoprolol XL 25 mg BID  - Continue atorvastatin 80 mg daily  - Cardiology consult in AM  - Stress test with myocardial perfusion ordered  - Telemetry monitoring  - Aggressive electrolyte repletion. K >4. Mg >2.   - Orthostatic vitals in AM.   - Pain Regimen:   - Tylenol 650 mg q6h PRN  - Oxycodone 5 mg q4h PRN   - Lidocaine patch q12h  - Bowel Regimen:   - PEG 17g qD  - Can consider 1. repeat echo and 2. BL LE dopplers with his elevated d dimer and fibrinogen, though likely low yield given absence of clinical correlation.     CHRONIC PROBLEMS:  #Type II DM  Reports on 20u Lantus at home.  - Lantus 10 units   - Lispro 0-5u TID w meals, 0-4u qHS    HOME MEDS HELD DURING ADMISSION:  None    Hospital Bundle   Code Status: Full Code  Diet: NPO diet  Adult diet Regular  DVT Prophylaxis: On SC heparin  Contact: Jackson,Allice; Mother  Mobile Phone: [phone]   Access:   Patient Lines/Drains/Airways Status       Active .       [name] Placement date Placement time Site Days    Peripheral IV 08/04/23 Anterior;Left;Upper Arm 08/04/23  1251  Arm  less than 1                  =========--<?>--=========  Khush Pasion Alric Seton, MD  Ophthalmology, PGY-1   [facility]

## 2023-08-04 NOTE — Significant Event (Signed)
 AMA Note    I was called to the bedside to see the patient who was requesting to leave against medical advice (AMA). The patient was alert and oriented x3 and was able to explain why he was in the hospital. I explained the risks of leaving the hospital, including but not limited to death, and the patient was able to explain them back to me. I encouraged the patient to seek care for any worsening or new symptoms. The patient's IV was removed prior to leaving. The AMA form was signed.     ---  Carlis Stable, MD, MBA  Internal Medicine, PGY-2  [facility]

## 2023-08-04 NOTE — Hospital Course (Signed)
 Patient [name]  Date of Birth: Feb 22, 1976  MRN: [id number]  Discharge Provider:Kathleen Irving Copas, MD  Primary Care Physician:No PCP, Per Patient     Admission Date:08/04/2023 - 08/04/23     Patient Class: Inpatient    Items for outpatient follow-up:  [ ]  Establish with cardiology to obtain outpatient stress test    Medication changes as below:  None      Brief Summary:  [name] is a 48 y.o. 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 ED course notable for significant elevated D-dimer to 7700 with CTA unremarkable for PE.  Unclear etiology of D-dimer elevation as well as presyncope with plan for syncopal workup and possible stress test tomorrow however patient unfortunately left AMA on arrival to the floor.    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 Has had several ED visits in the past few months for similar symptoms. Pt story suggestive of possible pre-syncopal episode. Fibrinogen 499. D-Dimer 7,270. CRP 18.7. Troponin flat, 15 -> 13. BNP WNL. CTA Chest negative for PE. Given extensive cardiovascular history and classic chest pain will treat as unstable angina. Patient allergic to nitroglycerin, pain improving with Morphine.  Echo on 07/06/2023 at [facility] with normal EF of 65%, Moderate concentric left ventricular hypertrophy noted. No regional wall motion abnormalities noted. Prior left heart cath and nuclear stress testing with no evidence of significant CAD though did demonstrate mild irregularity at the RCA.  Planned for telemetry monitoring for possible arrhythmogenic source of presyncope as well as exercise  stress test for workup of his acute on chronic anginal pain with plan for cardiology consult and coordination for cardiology follow-up as an outpatient.  Unfortunately patient left AMA shortly after admission prior to ability to complete the studies or obtain further workup for D-dimer elevation.      HPI From Admission Copied for Reference:  [name], 48 y.o. 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      The pain started approximately 40 minutes before ambulance arrival @ ~11AM. Patient describes the pain as 8/10, 'stabbing' and similar to when he had a heart attack in 1998. Associated symptoms include sweating, nausea, and dizziness at time of episode. Denies SOB, room spinning/vertigo, fevers, chills, orthopnea, lower extremity pain/swelling, neck or back pain. Pain is not reproducible with palpation of the chest. He did not lose consciousness, but reported EMS had to catch him after standing up. He reports low blood pressure on EMS arrival ("80/0") and was initially hypotensive upon arrival at the hospital. Patient has had similar episodes in the past, with the last visit to the ED on Jan 29th for chest pain. He reports that he will be leaving if he is going to not get morphine/oxycodone as he can just be in pain at home if that were going to be the case. He denies tylenol having any effect on his pain. He threw the lidocaine patch  on the floor because it was not helping.      ED Course:   - Presenting vitals: afebrile, hemodynamically stable  - Labs: D-dimer elevated at 7,270, CRP 18.7. Glucose 225. AG 15.   - Imaging: CTA Chest: negative for PE. Similar distribution of diffuse patchy groundglass opacities although with slight increased density compared to prior, findings are nonspecific and may be  seen in setting of pulmonary hypertension in the proper clinical setting versus small airway disease   - Treatments: aspirin, benadryl, fentanyl, morphine x2, lidocaine patch, Zofran, 1L NS bolus      Relevant Imaging Findings:  CTA CHEST PULMONARY EMBOLISM   Final Result   1.  No evidence of acute or chronic pulmonary embolus.    2.  Similar distribution of diffuse patchy groundglass opacities although with slight increased density compared to prior, findings are nonspecific and may be seen in setting of pulmonary hypertension in the proper clinical setting versus small airway disease.      APPROVED BY STAFF RADIOLOGIST: Carolina Reveron-Arias 08/04/2023 2:12 PM EDT      XR CHEST PORTABLE   Final Result   No acute pulmonary abnormality.      DICTATED: Darra Lis   IN ACCORDANCE WITH DEPARTMENT POLICY, TEACHING PHYSICIANS REVIEW ALL IMAGES, AND EDIT REPORTS AS REQUIRED. A REPORT IS NOT FINAL UNTIL APPROVED BY A STAFF RADIOLOGIST.      APPROVED BY STAFF RADIOLOGIST: Bosie Clos, MD 08/04/2023 1:38 PM EDT      Stress test with myocardial perfusion    (Results Pending)       Medications on discharge:     Your medication list        ASK your doctor about these medications        Instructions Last Dose Given Next Dose Due   aspirin 81 mg capsule      Take 81 mg by mouth in the morning.       atorvastatin 10 mg tablet  Commonly known as: Lipitor      Take 80 mg by mouth in the morning.       insulin glargine 100 unit/mL (3 mL) injection  Commonly known as: Lantus      Inject 15 Units under the skin at bedtime.       metoprolol succinate XL 50 mg 24 hr tablet  Commonly known as: Toprol-XL      Take 25 mg by mouth twice daily.       Ventolin HFA 90 mcg/actuation inhaler  Generic drug: albuterol      Inhale 2 puffs every 6 (six) hours if needed.                Pertinent Physical Exam Findings  General: Patient well-appearing in bed. NAD.  Neuro: A&Ox3. No focal neuro deficits.  HEENT: MMM. Sclera anicteric and  uninjected.  Chest/Lungs: CTAB. No increased WOB. No wheezes, crackles, ronchi. Gynecomastia.   CV: RRR, normal S1/S2, no murmurs.  Abd: Soft, NT, ND, +bowel sounds.  Ext: Warm, well-perfused. No LE edema. No LE erythema, warmth, swelling, or tenderness, bilaterally.  Skin: No rashes over exposed areas.  Psych: Euthymic with normal affect.    Future Outpatient Appointments:  No future appointments.

## 2023-08-04 NOTE — ED Provider Notes (Addendum)
 EMERGENCY DEPARTMENT ENCOUNTER    ATTENDING NOTE    Date of service: 08/04/2023 12:16 PM  Chief Complaint   Patient presents with    Chest Pain     Nursing notes reviewed and agreed with unless modified below.    HISTORY OF PRESENT ILLNESS:  History obtained from: Patient    HPI: [name], 48 y.o. 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  Over the past hour, he developed sharp stabbing and left-sided chest pain that radiates to the left shoulder and is associated with shortness of breath.  He reports similar symptomatology to when he had a myocardial infarction at 48 years old. Does not feel similar to types of chest pain that he was evaluated for in the ED the past couple of months.  He feels faint and lightheaded.  No vertigo.  Reports mild cough.  No fevers or chills.  Pain is not reproducible with palpation of the chest.  No exertional dyspnea, orthopnea, or PND.  No lower extremity swelling, calf pain, or hemoptysis.  On EMS arrival, he was noted to be hypotensive with blood pressures of 80.  Heart rate persistently normal.  Blood pressure improved on Trendelenburg.  Was given 324 of aspirin.  On presentation, pain is 9/10.  Not in distress, speaking full sentences.  EKG does not show ST elevation MI.    On chart review, the patient had MN imaging that was inconclusive with results showing: Small area of mild-to-moderately decreased perfusion  involving the inferior apex on rest images.  Attenuation corrected images were not acquired.  Stress images were not acquired therefore cannot assess whether or not the defect represents a fixed defect or may be related to artifact.     REVIEW OF SYSTEMS  Positives and pertinent negatives as per HPI, otherwise negative ROS.   I reviewed the allergies and medical, surgical, family, and social history.    PAST MEDICAL HISTORY  Past Medical History:    Diagnosis Date    Coronary artery disease     Diabetes mellitus (Multi-HCC)     Heart attack (Multi-HCC)     x3    Hypertension     pt denies having high BP     PROBLEM LIST  Patient Active Problem List   Diagnosis    Chest pain    Hypokalemia    Chest pain, unspecified type    Type 2 diabetes mellitus (Multi-HCC)    Tobacco abuse    Obesity    Presence of aortocoronary bypass graft    PCR positive for hepatitis C virus    HLD (hyperlipidemia)    Essential hypertension    Congenital anomaly of artery (HHS-HCC)     PAST SURGICAL HISTORY  Past Surgical History:   Procedure Laterality Date    ARTERIAL BYPASS SURGERY      x2    CHOLECYSTECTOMY      CORONARY ARTERY BYPASS GRAFT      CTA ABDOMEN PELVIS W AND WO CONTRAST  12/03/2022    CTA ABDOMEN PELVIS W AND WO CONTRAST 12/03/2022     MEDICATIONS   Previous Medications    ASPIRIN 81 MG CAPSULE    Take 81 mg by mouth in the morning.    ATORVASTATIN (LIPITOR) 10 MG TABLET    Take 40 mg by mouth in the morning.    CLINDAMYCIN HCL ORAL    Take  by mouth.    INSULIN GLARGINE (LANTUS) 100 UNIT/ML (3 ML) INJECTION    Inject 15 Units under the skin at bedtime.    METOPROLOL SUCCINATE XL (TOPROL-XL) 50 MG 24 HR TABLET    Take 25 mg by mouth twice daily.    METOPROLOL TARTRATE (LOPRESSOR) 25 MG TABLET    Take 25 mg by mouth in the morning and 25 mg in the evening.    VENTOLIN HFA 90 MCG/ACTUATION INHALER    Inhale 2 puffs every 6 (six) hours if needed.      ALLERGIES  Allergies   Allergen Reactions    Ibuprofen Anaphylaxis, Hives, Itching and Rash     Other reaction(s): Anaphylactic reaction, Unknown, Urticaria  anaphylaxis  anaphylaxis      Ketorolac Anaphylaxis, Hives and Rash     Other reaction(s): ITCHY HIVES, Unknown, Urticaria  Converted from Generic Allergy: Ketorolac  Anaphylaxis   Anaphylaxis   Tolerates ASA      Nitroglycerin      hives    Tramadol Anaphylaxis, Angioedema, Hives and Rash     Other reaction(s): THROAT CLOSES, Unknown, Urticaria  Patient confirmed that he  can take Percocet, Hydromorphone and Morphine. [name], Pharmacy  anaphylaxis  anaphylaxis      Codeine      SOCIAL HISTORY  Social History     Tobacco Use    Smoking status: Every Day     Current packs/day: 0.50     Types: Cigarettes     Passive exposure: Current    Smokeless tobacco: Never   Substance Use Topics    Alcohol use: Never     Social History     Substance and Sexual Activity   Drug Use Never     FAMILY HISTORY  Family History   Problem Relation Name Age of Onset    Heart disease Father         PHYSICAL EXAM  I have reviewed the triage vital signs.  ED Triage Vitals [08/04/23 1218]   Temp Pulse Resp BP   37.2 C (99 F) 99 20 108/77      SpO2 Temp Source Heart Rate Source Patient Position   96 % Oral -- --      BP Location FiO2 (%)     -- --        Physical Exam  GENERAL: awake, alert, cooperative, not in distress  HEENT: Pupils equal, EOMI. Head atraumatic  CV: audible heart sounds, warm and perfused extremities bilaterally  PULMONARY: Good air movement, no wheezes, no crackles  ABDOMEN/GU: soft, no distension, no guarding, no abdominal tenderness, murphy negative, Mcburney negative, no CVA tenderness.  EXTREMITIES/BACK: warm and perfused, no tenderness, no edema, no signs of DVT (warmth, asymmetric swelling, erythema, or calf tenderness).  SKIN: no rashes or signs of trauma  NEURO: Speech clear. Moves U&LE to command    EKG & Cardiac Monitoring  EKG visualized and interpreted by me:   Rhythm: normal sinus rhythm; and regular . Rate (approx.): 94.  Axis: normal;  PR interval: normal;  QRS interval: normal ;  ST/T wave: normal;       LABS  Labs Reviewed   COMPREHENSIVE METABOLIC PANEL - Abnormal       Result Value    Sodium 135      Potassium 3.8      Chloride 98      CO2 (Bicarbonate) 22      Anion Gap 15 (*)     BUN 16  Creatinine 1.30      eGFRcr 69      Glucose 225 (*)     Fasting? Unknown      Calcium 9.3      AST 79 (*)     ALT 77 (*)     Alkaline phosphatase 60      Protein, total 7.8       Albumin 3.9      Bilirubin, total 0.7     D-DIMER, QUANTITATIVE - Abnormal    D-DIMER 7,270.00 (*)    TROPONIN T, HIGH SENSITIVITY - Abnormal    Troponin T, High Sensitivity 15 (*)    CBC WITH DIFFERENTIAL - Abnormal    WBC 3.3 (*)     RBC 5.18      Hemoglobin 15.2      Hematocrit 45.9      MCV 88.6      MCH 29.3      MCHC 33.1      RDW-CV 12.6      RDW-SD 41.3      Platelets 152      MPV 10.1      Neutrophil % 63.2      Lymphocyte % 21.6      Monocytes % 12.5      Eosinophils % 0.9      Basophils % 0.6      Immature Granulocytes % 1.2      NRBC % 0.0      Neutrophils Absolute 2.07      Lymphocytes Absolute 0.71      Monocytes Absolute 0.41      Eosinophils Absolute 0.03      Basophils Absolute 0.02      Immature Granulocytes Absolute 0.04      NRBC Absolute 0.00     TROPONIN T, HIGH SENSITIVITY - Abnormal    Troponin T, High Sensitivity 13 (*)    NT PRO BNP - Normal    NT-proBNP 55      Narrative:     The 2021 Universal Definition and Classification of Heart Failure states that a diagnosis of heart failure (HF) requires symptoms and/or signs of HF caused by structural and/or functional cardiac abnormalities, accompanied by objective evidence of congestion and/or:     Test             Ambulatory Patients    Hospitalized/Decompensated   NT-proBNP (pg/mL)   >=125                  >=300         Patients with heart failure with preserved ejection fraction (HFpEF) have lower levels of the natriuretic peptides than patients with a similar severity of heart failure with reduced ejection fraction (HFrEF).     In dyspneic patients in emergency department settings,2 NT-proBNP cut-offs for diagnosis of acute HF were 450, 900, and 1,800 pg/ml for ages <50, 50 to 58, and >75 years.     (1) Bozkurt B et al.  J Cardiac Fail 2021;27:387-413   (2) Madalyn Rob et al. J Am Coll Cardiol 2018;71:1191-1200.    CBC W/DIFF    Narrative:     The following orders were created for panel order CBC and differential.  Procedure                                Abnormality         Status                     ---------                               -----------         ------  CBC w/ Differential[[id number]]          Abnormal            Final result                 Please view results for these tests on the individual orders.      RADIOLOGY  Any X-rays performed were contemporaneously visualized and interpreted by me.    CTA CHEST PULMONARY EMBOLISM   Final Result   1.  No evidence of acute or chronic pulmonary embolus.    2.  Similar distribution of diffuse patchy groundglass opacities although with slight increased density compared to prior, findings are nonspecific and may be seen in setting of pulmonary hypertension in the proper clinical setting versus small airway disease.      APPROVED BY STAFF RADIOLOGIST: Carolina Reveron-Arias 08/04/2023 2:12 PM EDT      XR CHEST PORTABLE   Final Result   No acute pulmonary abnormality.      DICTATED: Darra Lis   IN ACCORDANCE WITH DEPARTMENT POLICY, TEACHING PHYSICIANS REVIEW ALL IMAGES, AND EDIT REPORTS AS REQUIRED. A REPORT IS NOT FINAL UNTIL APPROVED BY A STAFF RADIOLOGIST.      APPROVED BY STAFF RADIOLOGIST: Bosie Clos, MD 08/04/2023 1:38 PM EDT          MEDICAL DECISION MAKING & ED COURSE  MDM  Patient is a 48 y.o. adult presenting for CP. Vitals reveal  hypotension on scene  and physical exam reveals no significant abnormalities. EKG showed no significant abnormalities. Based on the history, physical exam, risk factors, and vital signs, differential includes: ACS, NSTEMI, dissection, PE, hypovolemia, ACS, cardiomyopathy, pneumonia, pneumothorax.    See ED Course below for evaluation, reassessment and further decision making.      ED Course  ED Course as of 08/04/23 1622   Sun Aug 04, 2023   1247 Chronic leukopenia noted, no concern for acute process. Hemoglobin not suggestive of acute anemia.     1309 D-dimer elevated, will do CTPE.   1325 No significant electrolyte derangements.  Creatinine is not elevated more than baseline range making AKI unlikely. No significant transaminitis noted. Normal bilirubin.    Trop 15 will trend    BNP is not significantly elevated making acute CHF less likely.     1332 Chest x-ray negative for acute pathology: no CXR evidence of pulmonary edema, pleural effusion, pneumothorax, or pneumonia. This study was interpreted by me and confirmed on report.   1420 Chest CT angiography was negative for PE, pneumonia, pulmonary edema, or masses.    1420 Second troponin with negative delta change. ACS ruled out per the high-sensitivity troponin algorithm.           Diagnoses as of 08/04/23 1622   Chest pain, unspecified type       Reassessment:  Will admit for ongoing CP and HEART score 4, and DDimer of 7k compared to 400 from 2 months ago, and transient hypotension on initial admission, different picture that regular presentations.       Records reviewed: Nursing notes, Previous medical records, Previous labs, Previous radiology studies, and Previous EKGs  Social determinants of health affecting management: None  Discussed with: Nursing    CLINICAL IMPRESSIONS:  1. Chest pain, unspecified type      DISPOSITION:  ADMITTED TO HOSPITAL  After completion of ED workup and discussion of results and diagnoses, patient was admitted to the hospital.    PROCEDURES, CRITICAL CARE, & CLINICAL TOOLS:  Preformed by Tressie Ellis, MD.  CRITICAL CARE DOCUMENTATION  Not applicable    HEART SCORE  HEART score = 4. A score <4 has 0.9-1.7% risk of adverse cardiac event. In the HEART Score study, these patients were discharged (0.99% in the retrospective study, 1.7% in the prospective study).    PROCEDURE: Ultrasound IV Placement     Performed by: Tressie Ellis, MD   Date: 08/04/2023     Indication: IV access required.      Site: L arm     Procedure: The area was prepped in the usual fashion. The vein was identified by ultrasound and was compressible. Compressibility insured no thrombosis  precluded cannulation. The vein was cannulated with a the angiocath with use of dynamic ultrasound to guide the needle into the vein. Venous blood was obtained. Catheter was threaded into the vein and was connected to the IV tubing. Catheter flushed easily. No infiltration noted on palpation during and after flush. The patient tolerated the procedure well. Repeat neurovascular exam is normal including unchanged skin color and cap refill. Complications: None     Procedures     RESTRAINTS       NOTES:  - I am the first and primary provider for this patient and am the primary provider of record.  - Available medical records, nursing notes, old EKGs, and EMS run sheets (if patient was EMS transported) were reviewed  - This dictation was completed with a computer voice recognition software.     Mahlon Gabrielle Anola Gurney, MD  08/04/23 0981       Tressie Ellis, MD  08/04/23 1622

## 2023-08-04 NOTE — ED Notes (Signed)
 Reports given to N8 RN.      Donnelly Angelica, RN  08/04/23 [phone]

## 2023-08-09 ENCOUNTER — Observation Stay: Admission: EM | Admit: 2023-08-09 | Discharge: 2023-08-09 | Discharge status: Against Medical Advice | Payer: MEDICAID

## 2023-08-09 DIAGNOSIS — R0789 Other chest pain: Principal | ICD-10-CM

## 2023-08-09 DIAGNOSIS — R079 Chest pain, unspecified: Secondary | ICD-10-CM

## 2023-08-09 LAB — COMPREHENSIVE METABOLIC PANEL
ALT: 41 U/L (ref 0–55)
AST: 47 U/L — ABNORMAL HIGH (ref 6–42)
Albumin: 3.6 g/dL (ref 3.2–5.0)
Alkaline phosphatase: 57 U/L (ref 30–130)
Anion Gap: 10 mmol/L (ref 3–14)
BUN: 6 mg/dL (ref 6–24)
Bilirubin, total: 0.3 mg/dL (ref 0.2–1.2)
CO2 (Bicarbonate): 23 mmol/L (ref 20–32)
Calcium: 8.8 mg/dL (ref 8.5–10.5)
Chloride: 105 mmol/L (ref 98–110)
Creatinine: 0.78 mg/dL (ref 0.55–1.30)
Glucose: 161 mg/dL — ABNORMAL HIGH (ref 70–139)
Potassium: 3.8 mmol/L (ref 3.6–5.2)
Protein, total: 7.1 g/dL (ref 6.0–8.4)
Sodium: 138 mmol/L (ref 135–146)
eGFRcr: 112 mL/min/{1.73_m2} (ref >=60)

## 2023-08-09 LAB — MANUAL DIFFERENTIAL - SYSMEX WAM
Basophils %: 0 %
Basophils Absolute: 0 10*3/uL (ref 0.00–0.22)
Eosinophils %: 3 %
Eosinophils Absolute: 0.15 10*3/uL (ref 0.00–0.50)
Lymphocytes %: 35 %
Lymphocytes Absolute: 1.76 10*3/uL (ref 0.70–4.00)
Monocytes %: 8 %
Monocytes Absolute: 0.4 10*3/uL (ref 0.36–0.83)
Neutrophils %: 43 %
Neutrophils Absolute: 2.16 10*3/uL (ref 1.50–7.95)
Plasma Cell %: 3 %
Plasma Cell Absolute: 0.15 10*3/uL — ABNORMAL HIGH (ref 0.00–0.00)
Reactive Lymphocytes %: 8 %
Reactive Lymphs Absolute: 0.4 10*3/uL — ABNORMAL HIGH (ref 0.00–0.00)
Total WBC Counted: 113

## 2023-08-09 LAB — URINE DRUG SCREEN
Amphetamines screen, urine: NOT DETECTED
Barbiturates screen, urine: NOT DETECTED
Benzodiazepines screen, urine: NOT DETECTED
Buprenorphine screen, urine: NOT DETECTED
Cannabinoids screen, urine: NOT DETECTED
Cocaine screen, urine: NOT DETECTED
Creatinine, urine, specimen validity: 158 mg/dL (ref >=20.00)
Ethanol screen, urine: NOT DETECTED
Fentanyl screen, urine: NOT DETECTED
Methadone screen, urine: NOT DETECTED
Opiates screen, urine: DETECTED — AB
Oxycodone screen, urine: DETECTED — AB

## 2023-08-09 LAB — SDS, SERUM DRUG SCREEN
Acetaminophen level: <5 ug/mL (ref <=30)
Benzodiazepines screen, serum: NOT DETECTED
Ethanol level, plasma/serum: 12 mg/dL — ABNORMAL HIGH (ref <=10)
Salicylate level: <0.5 mg/dL (ref 0.0–29.9)
Tricyclics screen, serum: NOT DETECTED

## 2023-08-09 LAB — CBC WITH DIFFERENTIAL
Hematocrit: 37.3 % (ref 32.0–53.0)
Hemoglobin: 12.4 g/dL — ABNORMAL LOW (ref 13.0–17.5)
MCH: 29.7 pg (ref 26.0–34.0)
MCHC: 33.2 g/dL (ref 31.0–37.0)
MCV: 89.4 fL (ref 80.0–100.0)
MPV: 10.3 fL (ref 9.1–12.4)
NRBC %: 0 % (ref 0.0–0.0)
NRBC Absolute: 0 10*3/uL (ref 0.00–2.00)
Platelets: 228 10*3/uL (ref 150–400)
RBC: 4.17 M/uL — ABNORMAL LOW (ref 4.20–5.90)
RDW-CV: 12.6 % (ref 11.5–14.5)
RDW-SD: 41.1 fL (ref 35.0–51.0)
WBC: 5 10*3/uL (ref 4.0–11.0)

## 2023-08-09 LAB — TROPONIN T, HIGH SENSITIVITY
Troponin T, High Sensitivity: 10 ng/L (ref <12)
Troponin T, High Sensitivity: 9 ng/L (ref <12)

## 2023-08-09 MED ORDER — HYDROmorphone (Dilaudid) injection 0.5 mg
1 | Freq: Once | INTRAMUSCULAR | Status: AC
Start: 2023-08-09 — End: 2023-08-09
  Administered 2023-08-09: 13:00:00 0.5 mg via INTRAVENOUS

## 2023-08-09 MED ORDER — morphine injection 4 mg
4 | Freq: Once | INTRAVENOUS | Status: AC
Start: 2023-08-09 — End: 2023-08-09
  Administered 2023-08-09: 10:00:00 4 mg via INTRAVENOUS

## 2023-08-09 MED ORDER — atorvastatin (Lipitor) tablet 80 mg
80 | Freq: Every day | ORAL | Status: DC
Start: 2023-08-09 — End: 2023-08-09
  Administered 2023-08-09: 15:00:00 80 mg via ORAL

## 2023-08-09 MED ORDER — metoprolol succinate XL (Toprol-XL) 24 hr tablet 25 mg
25 | Freq: Two times a day (BID) | ORAL | Status: DC
Start: 2023-08-09 — End: 2023-08-09
  Administered 2023-08-09: 15:00:00 25 mg via ORAL

## 2023-08-09 MED ORDER — ondansetron (Zofran) injection 4 mg
4 | Freq: Once | INTRAMUSCULAR | Status: AC
Start: 2023-08-09 — End: 2023-08-09
  Administered 2023-08-09: 13:00:00 4 mg via INTRAVENOUS

## 2023-08-09 MED ORDER — naloxone (Narcan) injection 0.04 mg
0.4 | INTRAMUSCULAR | Status: DC | PRN
Start: 2023-08-09 — End: 2023-08-09

## 2023-08-09 MED ORDER — heparin (porcine) injection 5,000 Units
5000 | Freq: Three times a day (TID) | INTRAMUSCULAR | Status: DC
Start: 2023-08-09 — End: 2023-08-09

## 2023-08-09 MED ORDER — HYDROmorphone (Dilaudid) injection 0.5 mg
1 | Freq: Once | INTRAMUSCULAR | Status: AC
Start: 2023-08-09 — End: 2023-08-09
  Administered 2023-08-09: 11:00:00 0.5 mg via INTRAVENOUS

## 2023-08-09 MED ORDER — aspirin EC tablet 81 mg
81 | Freq: Every day | ORAL | Status: DC
Start: 2023-08-09 — End: 2023-08-09
  Administered 2023-08-09: 15:00:00 81 mg via ORAL

## 2023-08-09 MED ORDER — ondansetron (Zofran) injection 4 mg
4 | Freq: Once | INTRAMUSCULAR | Status: AC
Start: 2023-08-09 — End: 2023-08-09
  Administered 2023-08-09: 10:00:00 4 mg via INTRAVENOUS

## 2023-08-09 MED FILL — METOPROLOL SUCCINATE ER 25 MG TABLET,EXTENDED RELEASE 24 HR: 25 25 mg | ORAL | Qty: 1

## 2023-08-09 MED FILL — MORPHINE 4 MG/ML INTRAVENOUS SOLUTION WRAPPER: 4 4 mg/mL | INTRAVENOUS | Qty: 1

## 2023-08-09 MED FILL — ONDANSETRON HCL (PF) 4 MG/2 ML INJECTION SOLUTION: 4 4 mg/2 mL | INTRAMUSCULAR | Qty: 2

## 2023-08-09 MED FILL — HYDROMORPHONE 1 MG/ML INJECTION WRAPPER: 1 1 mg/mL | INTRAMUSCULAR | Qty: 1

## 2023-08-09 MED FILL — ATORVASTATIN 80 MG TABLET: 80 80 mg | ORAL | Qty: 1

## 2023-08-09 MED FILL — ASPIRIN 81 MG TABLET,DELAYED RELEASE: 81 81 mg | ORAL | Qty: 1

## 2023-08-09 NOTE — ED Notes (Signed)
 Pt resting on stretcher. Medicated per orders. All needs met at this time      [name], [place]  08/09/23 [phone]

## 2023-08-09 NOTE — ED Notes (Signed)
 Patient agreeable to stay for admission at this time.      Allyson Sabal, RN  08/09/23 1000

## 2023-08-09 NOTE — ED Notes (Signed)
 Patient agitated and stating pain management is not sufficient enough at this time. Patient is in stretcher resting at this time on phone.      Allyson Sabal, RN  08/09/23 1000

## 2023-08-09 NOTE — ED Notes (Signed)
 Patient removed telemetry monitoring and pulse ox.      Allyson Sabal, RN  08/09/23 1124

## 2023-08-09 NOTE — ED Notes (Signed)
 Patient requesting to leave AMA again, MD [facility] aware.      Allyson Sabal, RN  08/09/23 [phone]

## 2023-08-09 NOTE — ED Notes (Signed)
 Patient request to leave, AMA form signed by patient and RN, was provided a sand which and ginger ale, IV removed by RN, walked out of ED with a steady ambulatory gait, equal chest rise and fall, even and unlabored respirations, GCS 15, and A&OX3.      Allyson Sabal, RN  08/09/23 1220

## 2023-08-09 NOTE — ED Triage Notes (Signed)
 PT BIBEMS for left sided chest pain that is radiating to the left arm and jaw. Pt reports he was woken from sleep. EMS reports history of MI and a bypass. EMS gave 324 mg of aspirin on scene.

## 2023-08-09 NOTE — ED Provider Notes (Signed)
 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  He notes that he was admitted last week for similar symptoms but left AMA before workup was completed.  He notes that the pain has since gotten worse.  He denies any syncope.  He denies any calf pain or swelling.      History provided by:  Patient      Patient History   Past Medical History:   Diagnosis Date    Coronary artery disease     Diabetes mellitus (Multi-HCC)     Heart attack (Multi-HCC)     x3    Hypertension     pt denies having high BP     Past Surgical History:   Procedure Laterality Date    ARTERIAL BYPASS SURGERY      x2    CHOLECYSTECTOMY      CORONARY ARTERY BYPASS GRAFT      CTA ABDOMEN PELVIS W AND WO CONTRAST  12/03/2022    CTA ABDOMEN PELVIS W AND WO CONTRAST 12/03/2022     Family History   Problem Relation Name Age of Onset    Heart disease Father       Social History     Tobacco Use    Smoking status: Every Day     Current packs/day: 0.50     Types: Cigarettes     Passive exposure: Current    Smokeless tobacco: Never   Vaping Use    Vaping status: Some Days    Substances: Nicotine   Substance Use Topics    Alcohol use: Never    Drug use: Never       Review of Systems   Review of Systems   Constitutional:         As noted in HPI and physician assistant documentation       Physical Exam   ED Triage Vitals [08/09/23 0300]   Temp Pulse Resp BP   36.9 C (98.4 F) 84 18 (!) 161/97      SpO2 Temp Source Heart Rate Source Patient Position   97 % Oral -- --      BP Location FiO2 (%)     -- --       Physical Exam  Vitals and nursing note reviewed.   Constitutional:       General: Arieh is not in acute distress.     Appearance: Nikash is well-developed.   HENT:      Head: Normocephalic and atraumatic.   Eyes:       Conjunctiva/sclera: Conjunctivae normal.   Cardiovascular:      Rate and Rhythm: Normal rate and regular rhythm.      Heart sounds: No murmur heard.  Pulmonary:      Effort: Pulmonary effort is normal. No respiratory distress.      Breath sounds: Normal breath sounds.   Chest:      Chest wall: No deformity or tenderness.      Comments: Midline sternotomy scar  Abdominal:      Palpations: Abdomen is soft.      Tenderness: There is no abdominal tenderness.   Musculoskeletal:         General: No  swelling.      Cervical back: Neck supple.      Right lower leg: No tenderness. No edema.      Left lower leg: No tenderness. No edema.   Skin:     General: Skin is warm and dry.      Capillary Refill: Capillary refill takes less than 2 seconds.   Neurological:      Mental Status: Khamari is alert and oriented to person, place, and time.   Psychiatric:         Mood and Affect: Mood normal.       No orders to display       Labs Reviewed   COMPREHENSIVE METABOLIC PANEL - Abnormal       Result Value    Sodium 138      Potassium 3.8      Chloride 105      CO2 (Bicarbonate) 23      Anion Gap 10      BUN 6      Creatinine 0.78      eGFRcr 112      Glucose 161 (*)     Fasting? Unknown      Calcium 8.8      AST 47 (*)     ALT 41      Alkaline phosphatase 57      Protein, total 7.1      Albumin 3.6      Bilirubin, total 0.3     CBC WITH DIFFERENTIAL - Abnormal    WBC 5.0      RBC 4.17 (*)     Hemoglobin 12.4 (*)     Hematocrit 37.3      MCV 89.4      MCH 29.7      MCHC 33.2      RDW-CV 12.6      RDW-SD 41.1      Platelets 228      MPV 10.3      NRBC % 0.0      NRBC Absolute 0.00     MANUAL DIFFERENTIAL - SYSMEX WAM - Abnormal    Neutrophils % 43      Lymphocytes % 35      Monocytes % 8      Eosinophils % 3      Basophils % 0      Plasma Cell % 3      Reactive Lymphocytes % 8      Neutrophils Absolute 2.16      Lymphocytes Absolute 1.76      Monocytes Absolute 0.40      Eosinophils Absolute 0.15      Basophils Absolute 0.00       Plasma Cell Absolute 0.15 (*)     Reactive Lymphs Absolute 0.40 (*)     Total WBC Counted 113      RBC Morphology See Indices (*)     Polychromasia 1+ (*)     Echinocyte 1+ (*)     Schistocytes 1+ (*)     Platelets, Giant Present (*)    TROPONIN T, HIGH SENSITIVITY - Normal    Troponin T, High Sensitivity 10     TROPONIN T, HIGH SENSITIVITY - Normal    Troponin T, High Sensitivity 9     CBC W/DIFF    Narrative:     The following orders were created for panel order CBC and differential.  Procedure  Abnormality         Status                     ---------                               -----------         ------                     CBC w/ Differential[[id number]]          Abnormal            Final result               Manual Differential (LAB.Marland KitchenMarland Kitchen[[id number]]  Abnormal            Final result                 Please view results for these tests on the individual orders.       Glasgow Coma Scale Score: 15      ED Course & MDM        Medical Decision Making  Patient presents with chest pain.  Given history, concern for possible ACS, unstable angina, angina.  Patient is PERC negative making PE unlikely.  Additionally, patient had negative CTA last week.  Serial troponins were stable but given concern for possible unstable angina, patient was paged for admission    Amount and/or Complexity of Data Reviewed  External Data Reviewed: notes.     Details: Discharge summary last week with the patient left AMA noting the patient was due for a stress test before his departure.  Patient had elevated D-dimer but negative CTA during that workup.  Labs: ordered.  ECG/medicine tests: ordered and independent interpretation performed.     Details: ECG independently visualized and interpreted by me demonstrates sinus rhythm rate of 80, left atrial enlargement, no ST or T wave changes, no active synovitis, otherwise normal ECG    Risk  Prescription drug management.        Encounter Date: 08/09/23   ECG 12 lead     Narrative                                 [facility]                                                                                  Test Date:    2023-08-09  Dennie Bible Name:     DAELAN GATT          Department:   N1-ED  Patient ID:   [id number]                 Room:         T02  Gender:       Male                     Technician:   AL  DOB:          1976-04-19  Requested By: Purcell Nails  Order Number: [id number]                Reading MD:                                      Measurements  Intervals                              Axis            Rate:         80                       P:            46  PR:           167                      QRS:          27  QRSD:         104                      T:            76  QT:           372                                      QTc:          430                                                                 Interpretive Statements  Sinus rhythm  Left atrial enlargement                         Vicente Masson, MD  08/09/23 [phone]

## 2023-08-09 NOTE — Unmapped (Signed)
 Patient: [name]   MRN: [id number]   Date of service: 08/09/2023   PCP: No PCP, Per Patient     EMERGENCY DEPARTMENT ENCOUNTER    CHIEF COMPLAINT       Chief Complaint   Patient presents with    Chest Pain       HPI     [name] is a 48 y.o. adult with a PMH of   Past Medical History:   Diagnosis Date    Coronary artery disease     Diabetes mellitus (Multi-HCC)     Heart attack (Multi-HCC)     x3    Hypertension     pt denies having high BP    who presents to the ED for evaluation of chest pain.  Patient reports substernal left-sided chest pain starting shortly before getting here.  He has had multiple ED visits at around [place] with similar complaints.  He reports he is allergic to nitroglycerin so was only taking aspirin.  Denies fever chills nausea or vomiting          PAST MEDICAL HISTORY         Past Medical History:   Diagnosis Date    Coronary artery disease     Diabetes mellitus (Multi-HCC)     Heart attack (Multi-HCC)     x3    Hypertension     pt denies having high BP       CURRENT MEDICATIONS       Previous Medications    ASPIRIN 81 MG CAPSULE    Take 81 mg by mouth in the morning.    ATORVASTATIN (LIPITOR) 10 MG TABLET    Take 80 mg by mouth in the morning.    INSULIN GLARGINE (LANTUS) 100 UNIT/ML (3 ML) INJECTION    Inject 15 Units under the skin at bedtime.    METOPROLOL SUCCINATE XL (TOPROL-XL) 50 MG 24 HR TABLET    Take 25 mg by mouth twice daily.         FAMILY HISTORY         Family History   Problem Relation Name Age of Onset    Heart disease Father         SURGICAL HISTORY         Past Surgical History:   Procedure Laterality Date    ARTERIAL BYPASS SURGERY      x2    CHOLECYSTECTOMY      CORONARY ARTERY BYPASS GRAFT      CTA ABDOMEN PELVIS W AND WO CONTRAST  12/03/2022    CTA ABDOMEN PELVIS W AND WO CONTRAST 12/03/2022       ALLERGIES         Allergies   Allergen Reactions    Ibuprofen Anaphylaxis, Hives, Itching and Rash     Other reaction(s): Anaphylactic reaction,  Unknown, Urticaria  anaphylaxis  anaphylaxis      Ketorolac Anaphylaxis, Hives and Rash     Other reaction(s): ITCHY HIVES, Unknown, Urticaria  Converted from Generic Allergy: Ketorolac  Anaphylaxis   Anaphylaxis   Tolerates ASA      Nitroglycerin      hives    Tramadol Anaphylaxis, Angioedema, Hives and Rash     Other reaction(s): THROAT CLOSES, Unknown, Urticaria  Patient confirmed that he can take Percocet, Hydromorphone and Morphine. [name], Pharmacy  anaphylaxis  anaphylaxis      Codeine        SOCIAL HISTORY  Social History     Socioeconomic History    Marital status: Single     Spouse name: Not on file    Number of children: Not on file    Years of education: Not on file    Highest education level: Not on file   Occupational History    Not on file   Tobacco Use    Smoking status: Every Day     Current packs/day: 0.50     Types: Cigarettes     Passive exposure: Current    Smokeless tobacco: Never   Vaping Use    Vaping status: Some Days    Substances: Nicotine   Substance and Sexual Activity    Alcohol use: Never    Drug use: Never    Sexual activity: Defer   Other Topics Concern    Not on file   Social History Narrative    Not on file     Social Determinants of Health     Financial Resource Strain: Medium Risk (06/01/2023)    Received from [facility]    Overall Financial Resource Strain (CARDIA)     Difficulty of Paying Living Expenses: Somewhat hard   Food Insecurity: Low Risk  (07/06/2023)    Received from [employer] Insecurity - SDOH Screener     Within the past 12 months, the food you bought just didn't last and you didn't have money to get more?: Never true     Within the past 12 months, you worried whether your food would run out before you got money to buy more?: Never true     Do you need help with Food resources? : Not on file     Patient indicated no issues from the most recent SDOH questionnaire: Not on file   Recent Concern: Food Insecurity - Food  Insecurity Present (06/01/2023)    Received from [facility]    Hunger Vital Sign     Worried About Running Out of Food in the Last Year: Often true     Ran Out of Food in the Last Year: Not on file   Transportation Needs: Low Risk  (07/06/2023)    Received from [employer] [facility] - SDOH Screener     Do you have trouble getting transportation to medical appointments?: No     Do you need help with Transportation to medical appointments?: Not on file     Patient indicated no issues from the most recent SDOH questionnaire: Not on file   Recent Concern: Transportation Needs - Unmet Transportation Needs (06/02/2023)    Received from [facility]    PRAPARE - Transportation     Lack of Transportation (Medical): Yes     Lack of Transportation (Non-Medical): Yes   Physical Activity: Not on file   Stress: No Stress Concern Present (05/14/2023)    Received from [facility] - Occupational Stress Questionnaire     Feeling of Stress : Only a little   Social Connections: Unknown (07/30/2019)    Received from [facility]    Social Connections     11. How often do you see or talk to people that you care about and feel close to? (Talking to friends on the phone or virtually, visiting friends/family, religious service, clubs): Not on file   Intimate Partner Violence: Unknown (07/03/2023)    Received  from [facility]    Humiliation, Afraid, Rape, and Kick questionnaire     Fear of Current or Ex-Partner: No     Emotionally Abused: Not on file     Physically Abused: Not on file     Sexually Abused: Not on file   Housing Stability: Low Risk  (07/06/2023)    Received from [facility]    Housing Stability - SDOH Screener     What is your living situation today?: Steady housing     Do you need help with Housing/Shelter resources? : Not on file     Patient indicated no issues from the most recent SDOH  questionnaire: Not on file     Homeless diagnosis active in problem list or in an encounter in the past year?: Not on file   Recent Concern: Housing Stability - High Risk (06/02/2023)    Received from [facility]    Housing Stability Vital Sign     Unable to Pay for Housing in the Last Year: Yes     Number of Times Moved in the Last Year: Not on file     Homeless in the Last Year: Yes       PHYSICAL EXAM        VITAL SIGNS:    ED Triage Vitals [08/09/23 0300]   Temp Pulse Resp BP   36.9 C (98.4 F) 84 18 (!) 161/97      SpO2 Temp Source Heart Rate Source Patient Position   97 % Oral -- --      BP Location FiO2 (%)     -- --          General: The patient appears awake, alert, non-toxic, and in no acute distress.  Head: Normocephalic, atraumatic.   Eyes: PERRL.  ENT: Moist mucus membranes.  Neck: Supple, trachea midline.  Respiratory: CTAB, non-labored breathing, symmetrical chest expansion, no respiratory distress.  CVS: Regular rate and rhythm  GI: Soft, non-tender. Normoactive bowel sounds in all quadrants. No distention, masses or guarding.   Neuro: GCS15, AOx4, cranial nerves II-XII grossly intact, mentation is appropriate for age, moving all 4 extremities, steady gait.  Psych: Behavior is cooperative and pleasant. Affect is calm.  Skin: Warm, dry and intact. Normal capillary refill.       RESULTS        LABS  Labs Reviewed   COMPREHENSIVE METABOLIC PANEL - Abnormal       Result Value    Sodium 138      Potassium 3.8      Chloride 105      CO2 (Bicarbonate) 23      Anion Gap 10      BUN 6      Creatinine 0.78      eGFRcr 112      Glucose 161 (*)     Fasting? Unknown      Calcium 8.8      AST 47 (*)     ALT 41      Alkaline phosphatase 57      Protein, total 7.1      Albumin 3.6      Bilirubin, total 0.3     CBC WITH DIFFERENTIAL - Abnormal    WBC 5.0      RBC 4.17 (*)     Hemoglobin 12.4 (*)     Hematocrit 37.3      MCV 89.4      MCH 29.7  MCHC 33.2      RDW-CV 12.6      RDW-SD 41.1       Platelets 228      MPV 10.3      NRBC % 0.0      NRBC Absolute 0.00     SDS, SERUM DRUG SCREEN - Abnormal    Acetaminophen level <5      Ethanol level, plasma/serum 12 (*)     Salicylate level <0.5      Benzodiazepines screen, serum Not Detected      Tricyclics screen, serum Not Detected     MANUAL DIFFERENTIAL - SYSMEX WAM - Abnormal    Neutrophils % 43      Lymphocytes % 35      Monocytes % 8      Eosinophils % 3      Basophils % 0      Plasma Cell % 3      Reactive Lymphocytes % 8      Neutrophils Absolute 2.16      Lymphocytes Absolute 1.76      Monocytes Absolute 0.40      Eosinophils Absolute 0.15      Basophils Absolute 0.00      Plasma Cell Absolute 0.15 (*)     Reactive Lymphs Absolute 0.40 (*)     Total WBC Counted 113      RBC Morphology See Indices (*)     Polychromasia 1+ (*)     Echinocyte 1+ (*)     Schistocytes 1+ (*)     Platelets, Giant Present (*)    TROPONIN T, HIGH SENSITIVITY - Normal    Troponin T, High Sensitivity 10     TROPONIN T, HIGH SENSITIVITY - Normal    Troponin T, High Sensitivity 9     CBC W/DIFF    Narrative:     The following orders were created for panel order CBC and differential.  Procedure                               Abnormality         Status                     ---------                               -----------         ------                     CBC w/ Differential[[id number]]          Abnormal            Final result               Manual Differential (LAB.Marland KitchenMarland Kitchen[[id number]]  Abnormal            Final result                 Please view results for these tests on the individual orders.       RADIOLOGY  No orders to display               ED COURSE & MEDICAL DECISION MAKING        Pertinent Labs & Imaging studies reviewed. (See chart for details)    Diagnoses as of 08/14/23 1924   Chest  pain       MDM  48 year old male who presents for evaluation of chest pain.  Patient was afebrile vital signs stable.  Labs within normal limits.  Patient continued to have chest discomfort despite  medications.  Discussed the case with cardiology who has not seen this patient knows them well.  They will not accept the patient to their service.  Ultimately patient was admitted to the medicine team for continued workup and management of atypical chest    CLINICAL IMPRESSION  1. Chest pain         Final Disposition: Admission  _______________________________________________________________________________________________    Enid Skeens, PA        Enid Skeens, [place]  08/14/23 Serena Croissant

## 2023-08-09 NOTE — ED Notes (Signed)
 Patient is agitated stating "I need more pain medication", "the doctors are lying to me". Requesting admission to come to bedside while tapping hand on side rail. MD aware.     Allyson Sabal, RN  08/09/23 1100

## 2023-08-09 NOTE — ED Notes (Signed)
 Patient is on call bell 3 times within 10 minutes stating "I need a doctor down here now, I am agitated and anxious, and its going to get a lot worse for everyone". Reached out to MD Jonette Pesa to discuss a plan for pain control and create boundaries with pain dosing schedule. Patient is up walking around in room, tapping on the desk, becoming agitated.      Allyson Sabal, RN  08/09/23 [phone]

## 2023-08-09 NOTE — ED Notes (Signed)
 Patient is requesting to leave AMA MD Jonette Pesa at bedside     Allyson Sabal, RN  08/09/23 [phone]

## 2023-08-09 NOTE — H&P (Signed)
 [facility] Admission History & Physical Note    PATIENT: [name]  MRN: [id number]   DATE OF ADMISSION: 08/09/2023  PCP: No PCP, Per Patient    CHIEF COMPLAINT:     Chief Complaint   Patient presents with    Chest Pain     HISTORY OF PRESENT ILLNESS:   [name] is a 48 y.o. 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     Patient reported the following (structured symptom wise, describing onset/duration/progression):  Mentions chest pain that started at 0200 that woke him up this morning. Says pain goes up to jaw, down to his L arm, and associated nausea and sweating.  Patient feels that the room is spinning and asks for medication to help with current chest pain and dizziness. Doesn't endorse back pain.   Last time he had this type of chest pain was when he had a heart attack.     ED Course:  - Presenting vitals: afebrile, hemodynamically stable.  - Labs:    Latest Reference Range & Units 08/09/23 04:57 08/09/23 06:21   Sodium 135 - 146 mmol/L 138    Potassium 3.6 - 5.2 mmol/L 3.8    CO2 (Bicarbonate) 20 - 32 mmol/L 23    BUN 6 - 24 mg/dL 6    Creatinine 6.27 - 1.30 mg/dL 0.35    Troponin T, High Sensitivity <12 ng/L 10 9   Auto WBC 4.0 - 11.0 K/uL 5.0    Hemoglobin 13.0 - 17.5 g/dL 00.9 (L)    Hematocrit 32.0 - 53.0 % 37.3    Platelets 150 - 400 K/uL 228    (L): Data is abnormally low  - Treatment:  Received Aspirin 324 mg by EMT, received Dilaudid 0.5 mg, Morphine 4 mg, and zofran IV 4 mg    Patient History   PAST MEDICAL HISTORY:     Patient Active Problem List   Diagnosis    Chest pain    Hypokalemia    Chest pain, unspecified type    Type 2 diabetes mellitus (Multi-HCC)    Tobacco abuse    Obesity    Presence of aortocoronary bypass graft    PCR positive for hepatitis C virus    HLD (hyperlipidemia)     Essential hypertension    Congenital anomaly of artery (HHS-HCC)       PAST SURGICAL HISTORY:     Past Surgical History:   Procedure Laterality Date    ARTERIAL BYPASS SURGERY      x2    CHOLECYSTECTOMY      CORONARY ARTERY BYPASS GRAFT      CTA ABDOMEN PELVIS W AND WO CONTRAST  12/03/2022    CTA ABDOMEN PELVIS W AND WO CONTRAST 12/03/2022        FAMILY HISTORY:     Family History   Problem Relation Name Age of Onset    Heart disease Father       SOCIAL HISTORY:     Social History     Tobacco Use    Smoking status: Every Day     Current packs/day: 0.50     Types: Cigarettes     Passive exposure: Current    Smokeless tobacco: Never   Vaping Use    Vaping status: Some Days    Substances: Nicotine  Substance Use Topics    Alcohol use: Never    Drug use: Never      Smoking: 1 cigarette every other day  Alcohol: No  Drugs: N/A    HOME MEDICATIONS:   List confirmed with: patient or family member.  Current Outpatient Medications   Medication Instructions    aspirin 81 mg, oral, Daily RT    atorvastatin (LIPITOR) 80 mg, oral, Daily RT    insulin glargine (LANTUS) 15 Units, subcutaneous, Nightly    metoprolol succinate XL (TOPROL-XL) 25 mg, oral, 2 times daily         Objective   PHYSICAL EXAM ON ADMISSION:   Vitals:   Initial Vitals:   36.9 C (98.4 F)  HR 84  BP (!) 161/97  RR 18   SpO2 97 % on room air.     Vitals:    08/09/23 0914 08/09/23 1056 08/09/23 1058 08/09/23 1100   BP: 137/72 134/67 135/84 (!) 158/87   BP Location: Right arm Left arm Right arm Right arm   Patient Position:  Lying Sitting Standing   Pulse: 67 63 75    Resp: 13 18 18     Temp:       TempSrc:       SpO2:  99% 99% 100%     Weight and BP trend:   Patient Weight   08/04/23 117.6 kg   04/28/23 120.2 kg   02/09/23 113.4 kg   01/19/23 115.7 kg   12/28/21 120.2 kg   03/28/21 118 kg   11/13/19 129.7 kg   06/14/19 122 kg   06/13/19 122 kg   05/11/18 84.9 kg      BP Readings from Last 10 Encounters:   08/09/23 (!) 158/87   08/04/23 112/64   06/19/23  (!) 144/70   05/07/23 (!) 129/93   04/28/23 (!) 164/95   02/09/23 (!) 144/83   01/20/23 (!) 137/91   01/22/22 (!) 147/88   12/28/21 (!) 164/94   12/18/21 (!) 179/107     Physical Examination:  General: Patient well-appearing NAD  Neuro: A&Ox3. Lessened sensation on L face and weakness on hip flex of LLE,.   HEENT: MMM. Sclera anicteric and uninjected  Chest/Lungs: CTAB. No increased WOB. No wheezes, crackles, ronchi.   CV: RRR, normal S1/S2, no murmurs  Volume Status: no JVP, lungs CTAB consistent with euvolemia  Abd: Soft, NT, ND, +bowel sounds  Ext: Warm, well-perfused. No pitting edema  Skin: No rashes over exposed areas. no cyanosis/clubbing  Psych: Euthymic with normal affect    LAB RESULTS:   Labs reviewed in the chart from this admission:  Lab Results   Component Value Date    NA 138 08/09/2023    K 3.8 08/09/2023    CL 105 08/09/2023    CO2 23 08/09/2023    BUN 6 08/09/2023    CREATININE 0.78 08/09/2023     Lab Results   Component Value Date    WBC 5.0 08/09/2023    HGB 12.4 (L) 08/09/2023    HCT 37.3 08/09/2023    MCV 89.4 08/09/2023    PLT 228 08/09/2023     Lab Results   Component Value Date    ALT 41 08/09/2023    AST 47 (H) 08/09/2023    BILITOT 0.3 08/09/2023    PROT 7.1 08/09/2023    ALBUMIN 3.6 08/09/2023     Lab Results   Component Value Date    PT 13.5 08/04/2023    INR 1.17  08/04/2023     Pain Management Panel          Latest Ref Rng & Units 01/20/2023   Pain Management Panel   Creatinine, Urine >=20.00 mg/dL 161.09    Amphetamines screen, urine Screen Negative Screen Negative    Barbiturates screen, urine Screen Negative Screen Negative    Benzodiazepines screen, urine Screen Negative Screen Negative    Buprenorphine Screen, urine Screen Negative Screen Negative    Cocaine screen, urine Screen Negative Screen Negative    Cannabinoids screen, urine Screen Negative Screen Negative    Fentanyl screen, urine Screen Negative Screen Negative    Opiates screen, urine Screen Negative Screen Negative     Methadone screen, urine Screen Negative Screen Negative    Oxycodone screen, urine Screen Negative Presumptive Positive       Details                  RADIOLOGY/CARDIAC TESTING/RELEVANT PROCEDURES:   Imaging:  06/01/2023 @ [place] Myocardial Perfusion Spect Single: Small area of mild-to-moderately decreased perfusion involving the inferior apex on rest images. Attenuation corrected  images were not acquired.  Stress images were not acquired therefore cannot assess whether or not the defect represents a fixed defect or  may be related to artifact. COMPARISON to 12/31/2008: Normal left ventricular myocardial perfusion study. Normal left ventricular wall motion, with a calculated ejection fraction of 55%.     EKG:    NSR, no signs of enlargement or ischemia.       Last TTE/RHC/LHC:    04/10/2023: EF 65% LV increased wall thickness, RV wnl, LA mildly dilated, RA wnl, MV TV PV wnl     ASSESSMENT & PLAN:   Ohn Bostic is a 48 y.o. 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     #Recurrent Chest Pain  Has presented [facility]+ [facility] to [facility] and other outside hospitals in past year for similar chest pain syndrome (pain that radiates to jaw and arm with some nausea without vomiting and hemisensory changes) where suspicion for ACS has been low due to negative biomarkers at multiple visits.  - 04/10/2023 admission where he was noted to have hemisensory changes with chest pain, during this admission patient endorsed less sensation of L face. MRI at the time demonstrated no acute intracranial process and chronic microvascular changes.  DDX  - Despite endorsing 7-10/10 pain on initital exam and diaphoretic, patient appears comfortable in bed and is not sweating.  - Chest  pain etiology may be derived from current anxiety vs illness anxiety disorder, GERD, or underperfusion (last NM myocardial perfusion spect with mild to moderate decreased perfusion at inferior apex). However has a history of multiple TTE's, nuclear and exercise stress tests, and LHC without evidence of significant CAD, only notable for mild irregularity at the RCA.  Dx  - Troponin leveled at 10 and repeat at 9  - obtaining SDS: positive alcohol (patient had endorsed no alcohol use)  - Pending UDS  - orthostatic pressures: pending  - L + R arm BP: no changes  - May benefit from obtaining cardiac stress test   Tx  - c/w metoprolol succinate 25 mg BID  - c/w aspirin  - Pain management with close monitoring of MME    #  Dyslipidemia -   c/w lipitor 80 mg    Hospital Bundle   Code Status: Full Code  Diet: Adult diet Heart Healthy  Concern for malnutrition: No concerns   Telemetry and pulse oximetry necessity reviewed: Yes, Indication: Chest pain  DVT Prophylaxis: On SC heparin  Contact: [name],[name]; Mother  Mobile Phone: [phone]   Access:   Patient Lines/Drains/Airways Status       Active .       Name Placement date Placement time Site Days    Peripheral IV 08/09/23 Right Forearm 08/09/23  0457  Forearm  less than 1                  _____________________________  Dorna Leitz, MD  PGY-1 Internal Medicine  [facility]

## 2023-10-22 ENCOUNTER — Inpatient Hospital Stay
Admit: 2023-10-22 | Discharge: 2023-10-22 | Disposition: A | Discharge status: Home / Self Care | Payer: MEDICAID | Attending: Emergency Medicine

## 2023-10-22 DIAGNOSIS — M25562 Pain in left knee: Secondary | ICD-10-CM

## 2023-10-22 MED ORDER — lidocaine (Lidoderm) 5 % patch
5 | MEDICATED_PATCH | Freq: Every day | TOPICAL | 0 refills | 30.00000 days | Status: AC
Start: 2023-10-22 — End: ?

## 2023-10-22 NOTE — ED Notes (Signed)
 Pt left prior to D/C paperwork and instructions.      Homero Luster, RN  10/22/23 1024

## 2023-10-22 NOTE — ED Triage Notes (Signed)
 Pt reports known torn meniscus to left knee. Reports worsening pain and difficulty ambulating. Denies any new injury. Pt looking for ortho consult.

## 2023-10-22 NOTE — ED Provider Notes (Signed)
 [facility] EMERGENCY DEPARTMENT  [address]  [place] [address]        HPI   Chief Complaint   Patient presents with    Knee Pain        HPI  This is a 48 year old who returns for ongoing left knee pain.  The patient believes that he injured his left knee, meniscus injury, over 4 months ago.  He is not sure exactly how he injured the knee, but reports that he was seen by his PCP who obtained an MRI, which revealed a meniscus injury.  Since then, has been having intermittent, waxing waning, aching anterolateral knee pain, however, over the past 4 days, it has gradually worsened, and is now continuous, without increased swelling.  No fever chills nausea vomiting malaise.  Worse with bending and weightbearing.    SOCIAL HISTORY: No illicit drug use  Patient History   Medical History[1]  Surgical History[2]  Family History[3]  Social History     Tobacco Use    Smoking status: Every Day     Current packs/day: 0.50     Types: Cigarettes     Passive exposure: Current    Smokeless tobacco: Never   Vaping Use    Vaping status: Some Days    Substances: Nicotine   Substance Use Topics    Alcohol use: Never    Drug use: Never       Review of Systems   Review of Systems  Please refer to the physician assistants or residents notes, or as otherwise documented above in the history of present illness    Physical Exam   ED Triage Vitals [10/22/23 0853]   Temp Pulse Resp BP   35.8 C (96.4 F) 96 20 134/81      SpO2 Temp Source Heart Rate Source Patient Position   97 % Temporal Monitor Sitting      BP Location FiO2 (%)     Left arm --       Physical Exam  Constitution: Awake alert, comfortable.  Head is atraumatic.  Eyes without redness or discharge.  ENT: No oral lesions are noted.  Neck without lymphadenopathy.  Skin is dry without rash.  Lungs are clear and equal bilaterally.  Heart regular rate and rhythm.  Musculoskeletal: Strong equal bilateral Salas pedis and posterior tibial pulse with brisk distal  capillary refill, no calf asymmetry edema or tenderness.  The left knee, there is no significant effusion.  There is poorly localizing joint line tenderness, with minimal laxity to anterior drawer, no laxity to varus or valgus stress, no patellar apprehension.  No orders to display       Labs Reviewed - No data to display    Glasgow Coma Scale Score: 15      ED Course & MDM   Diagnoses as of 10/22/23 0929   Acute pain of left knee       Medical Decision Making  Problems Addressed:  Acute pain of left knee: complicated acute illness or injury    Amount and/or Complexity of Data Reviewed  External Data Reviewed: notes.    Risk  Prescription drug management.        Acute exacerbation of chronic left knee pain.  In the emergency room today, there is no evidence suggest a septic arthritis or crystal arthropathy, and, with no significant effusion, although it was considered, an arthrocentesis not indicated.  Also considered x-rays, however, in the absence of localizing bony tenderness, absence of blunt  trauma, and negative [place] knee rules, x-rays are not indicated.  We will treat with acetaminophen and Lidoderm (patient has NSAID allergy), discharge, refer to orthopedic surgery for outpatient follow-up, patient also has outpatient follow-up with his PCP this week, and return if worse or strict return precautions discussed.    DIAGNOSIS: Internal derangement left knee                        [1]   Past Medical History:  Diagnosis Date    Coronary artery disease      Diabetes mellitus (Multi-HCC)     Heart attack (Multi-HCC)     x3    Hypertension      pt denies having high BP   [2]   Past Surgical History:  Procedure Laterality Date    ARTERIAL BYPASS SURGERY      x2    CHOLECYSTECTOMY      CORONARY ARTERY BYPASS GRAFT     [3]   Family History  Problem Relation Name Age of Onset    Heart disease Father          Melton Squires B. Gricelda Foland, MD  10/28/23 [phone]

## 2023-10-22 NOTE — Discharge Instructions (Addendum)
 You were seen in our emergency department for evaluation of your left knee pain.  We have provided you with a referral for orthopedic program, they will call you within 48 hours to schedule an appointment.  You may continue using the Tylenol every 6 hours as well as the provided Lidoderm patches.  Continue elevating and icing your knee.  Return with new or worsening symptoms.

## 2023-10-22 NOTE — Unmapped (Signed)
 [facility] EMERGENCY DEPARTMENT  [address]  [place] [address]                    EMERGENCY DEPARTMENT ENCOUNTER    Physician Assistant Note    Date of service: 10/22/2023  8:52 AM    HISTORY OF PRESENT ILLNESS:    48 y.o. adult male who presents emergency department for evaluation of left knee pain.  Patient states he has a known meniscal injury that occurred 4 months ago.  He is unsure how he injured his knee at this time but was diagnosed with it through an MRI with his PCP.  He states that it was only intermittently hurting all recently and now it hurts all the time and he cannot wait till his appoint with his PCP on Friday because he has not been able to see orthopedics.    INDEPENDENT HISTORIAN    History provided by: Patient   History limited by:  None  Language interpreter used: No    PAST MEDICAL HISTORY / PROBLEM LIST    Medical History[1]    Problem List[2]    PAST SURGICAL HISTORY    Surgical History[3]    MEDICATIONS     Previous Medications    ASPIRIN 81 MG CAPSULE    Take 81 mg by mouth in the morning.    ATORVASTATIN (LIPITOR) 10 MG TABLET    Take 80 mg by mouth in the morning.    INSULIN GLARGINE (LANTUS) 100 UNIT/ML (3 ML) INJECTION    Inject 15 Units under the skin at bedtime.    METOPROLOL SUCCINATE XL (TOPROL-XL) 50 MG 24 HR TABLET    Take 25 mg by mouth twice daily.        ALLERGIES    Allergies[4]    SOCIAL HISTORY    Social History     Tobacco Use    Smoking status: Every Day     Current packs/day: 0.50     Types: Cigarettes     Passive exposure: Current    Smokeless tobacco: Never   Substance Use Topics    Alcohol use: Never     Social History     Substance and Sexual Activity   Drug Use Never       FAMILY HISTORY    Family History[5]    TRIAGE VITALS    ED Triage Vitals [10/22/23 0853]   Temp Pulse Resp BP   35.8 C (96.4 F) 96 20 134/81      SpO2 Temp Source Heart Rate Source Patient Position   97 % Temporal Monitor Sitting      BP Location FiO2 (%)     Left arm --             REVIEW OF SYSTEMS  A pertinent review of systems was obtained and is positive or negative as indicated in the HPI.      PHYSICAL EXAM    ED Triage Vitals [10/22/23 0853]   Temp Pulse Resp BP   35.8 C (96.4 F) 96 20 134/81      SpO2 Temp Source Heart Rate Source Patient Position   97 % Temporal Monitor Sitting      BP Location FiO2 (%)     Left arm --          General: Patient is awake and alert and non-toxic appearing.      Head: The head is normocephalic and atraumatic.       Eyes:  Anicteric.    ENT: Patient's airway is intact, mucous membranes moist.    Neck: The neck is supple, trachea midline.    Cardiac: Normal S1S2    Chest: No deformities. Equal chest rise and fall.    Respiratory: Respiratory effort is normal, no respiratory distress.    Abdomen: Appears flat, soft,.    Back: Normal ROM, no vertebral tenderness, no deformities.    Musculoskeletal: TTP over anterior knee with some laxity. No erythema, warmth edema. No notable effusion    Skin: The patient's skin is intact, no rashes.     Neurologic: No slurred speech. EOMI. A/O x3, GCS 15. Strength and sensation intact bilaterally.    Psychiatric: The patient is calm and cooperative.      MEDICAL DECISION MAKING    Nursing notes reviewed and I agree.    48 year old male presents emergency department for evaluation of left knee pain.  Patient appears well, no acute distress.  On exam he does have some tenderness to the anterior knee on lateral and medial side with some laxity.  He is able to flex and extend the knee without difficulty.  No warmth, erythema noted.  No notable effusion.  Given his history of known meniscal injury likely what is exacerbating his symptoms.  On [facility] review, patient has had at least 6 different providers providing him with Percocet over the past 6 months all in small fills.  We will offer Lidoderm patch and Ortho referral.    ACUTE CONDITIONS  Initial differential diagnosis: meniscus, ACL     CHRONIC  CONDITIONS  None    EXTERNAL RECORDS AND EXTERNAL DATA REVIEWED  Prior notes OSH, inpatient or outpatient, which were available were reviewed regarding patient's medical history in order to obtain information regarding the patient's history and up-to-date medication list. External run sheets reviewed from EMS or outside facilities if available in order to inform ED course     TESTS AND WORKUP CONSIDERED  XR    TREATMENT CONSIDERED  oxy    Relevant Scores  Glasgow Coma Scale Score: 15      INDEPENDENT INTERPRETATIONS    CARDIAC  N/A    LABS      Labs Reviewed - No data to display     RADIOLOGY  If applicable I have visualized, examined, and interpreted any of the patient's images and I agree with the radiologists report: Yes    No orders to display          DISCUSSION OF MANAGEMENT OR TEST INTERPRETATION WITH OTHER PROVIDERS INCLUDING CONSULTS  Dr. Estill Hemming     HOSPITALIZATION CONSIDERED  No    SOCIAL DETERMINATES OF HEALTH  Social determinants significantly affecting care include inadequate housing, low income, alcoholism/drug addiction, problems related to primary support group, unemployment, problems rate related to unemployment, dementia/memory issues which have been reviewed.      MY WORKING CLINICAL IMPRESSION / DIAGNOSIS:  Diagnoses as of 10/22/23 0916   Acute pain of left knee     Medications - No data to display      Disposition and Recommendations: Discharged     ED Prescriptions    None         Patient agreeable to plan.    Chart generated using dictation software, please be aware of "sound-a-like" terms and please excuse any typos!                    [1]   Past Medical History:  Diagnosis Date  Coronary artery disease      Diabetes mellitus (Multi-HCC)     Heart attack (Multi-HCC)     x3    Hypertension      pt denies having high BP   [2]   Patient Active Problem List  Diagnosis    Chest pain    Hypokalemia    Chest pain, unspecified type    Type 2 diabetes mellitus (Multi-HCC)    Tobacco abuse     Obesity    Presence of aortocoronary bypass graft    PCR positive for hepatitis C virus     HLD (hyperlipidemia)     Essential hypertension     Congenital anomaly of artery (HHS-HCC)   [3]   Past Surgical History:  Procedure Laterality Date    ARTERIAL BYPASS SURGERY      x2    CHOLECYSTECTOMY      CORONARY ARTERY BYPASS GRAFT     [4]   Allergies  Allergen Reactions    Ibuprofen Anaphylaxis, Hives, Itching and Rash     Other reaction(s): Anaphylactic reaction, Unknown, Urticaria  anaphylaxis  anaphylaxis      Ketorolac Anaphylaxis, Hives and Rash     Other reaction(s): ITCHY HIVES, Unknown, Urticaria  Converted from Generic Allergy: Ketorolac  Anaphylaxis   Anaphylaxis   Tolerates ASA      Nitroglycerin      hives    Tramadol Anaphylaxis, Angioedema, Hives and Rash     Other reaction(s): THROAT CLOSES, Unknown, Urticaria  Patient confirmed that he can take Percocet, Hydromorphone and Morphine. Edd Gong, Pharmacy  anaphylaxis  anaphylaxis      Codeine    [5]   Family History  Problem Relation Name Age of Onset    Heart disease Father          Agatha Horsfall, [place]  10/22/23 1453

## 2023-10-24 ENCOUNTER — Inpatient Hospital Stay: Admit: 2023-10-24 | Payer: MEDICAID

## 2023-10-24 ENCOUNTER — Ambulatory Visit: Admit: 2023-10-24 | Discharge: 2023-10-24 | Payer: MEDICAID | Attending: Orthopaedic Surgery

## 2023-10-24 ENCOUNTER — Inpatient Hospital Stay
Admit: 2023-10-24 | Discharge: 2023-10-25 | Disposition: A | Discharge status: Home / Self Care | Payer: MEDICAID | Attending: Emergency Medicine

## 2023-10-24 ENCOUNTER — Emergency Department: Admit: 2023-10-24 | Payer: MEDICAID

## 2023-10-24 DIAGNOSIS — M25562 Pain in left knee: Secondary | ICD-10-CM

## 2023-10-24 DIAGNOSIS — R0789 Other chest pain: Secondary | ICD-10-CM

## 2023-10-24 DIAGNOSIS — R079 Chest pain, unspecified: Secondary | ICD-10-CM

## 2023-10-24 LAB — COMPREHENSIVE METABOLIC PANEL
ALT: 111 U/L — ABNORMAL HIGH (ref 0–55)
AST: 58 U/L — ABNORMAL HIGH (ref 6–42)
Albumin: 4.3 g/dL (ref 3.2–5.0)
Alkaline phosphatase: 69 U/L (ref 30–130)
Anion Gap: 14 mmol/L (ref 3–14)
BUN: 8 mg/dL (ref 6–24)
Bilirubin, total: 0.2 mg/dL (ref 0.2–1.2)
CO2 (Bicarbonate): 23 mmol/L (ref 20–32)
Calcium: 10.5 mg/dL (ref 8.5–10.5)
Chloride: 100 mmol/L (ref 98–110)
Creatinine: 0.83 mg/dL (ref 0.55–1.30)
Glucose: 379 mg/dL — ABNORMAL HIGH (ref 70–139)
Potassium: 4.4 mmol/L (ref 3.6–5.2)
Protein, total: 8.4 g/dL (ref 6.0–8.4)
Sodium: 137 mmol/L (ref 135–146)
eGFRcr: 110 mL/min/{1.73_m2} (ref >=60)

## 2023-10-24 LAB — CBC WITH DIFFERENTIAL
Basophils %: 0.4 %
Basophils Absolute: 0.02 10*3/uL (ref 0.00–0.22)
Eosinophils %: 1.6 %
Eosinophils Absolute: 0.08 10*3/uL (ref 0.00–0.50)
Hematocrit: 43.9 % (ref 32.0–53.0)
Hemoglobin: 14.8 g/dL (ref 13.0–17.5)
Immature Granulocytes %: 0.6 %
Immature Granulocytes Absolute: 0.03 10*3/uL (ref 0.00–0.10)
Lymphocyte %: 35 %
Lymphocytes Absolute: 1.78 10*3/uL (ref 0.70–4.00)
MCH: 29.7 pg (ref 26.0–34.0)
MCHC: 33.7 g/dL (ref 31.0–37.0)
MCV: 88.2 fL (ref 80.0–100.0)
MPV: 10.6 fL (ref 9.1–12.4)
Monocytes %: 7.5 %
Monocytes Absolute: 0.38 10*3/uL (ref 0.36–0.83)
NRBC %: 0 % (ref 0.0–0.0)
NRBC Absolute: 0 10*3/uL (ref 0.00–2.00)
Neutrophil %: 54.9 %
Neutrophils Absolute: 2.8 10*3/uL (ref 1.50–7.95)
Platelets: 239 10*3/uL (ref 150–400)
RBC: 4.98 M/uL (ref 4.20–5.90)
RDW-CV: 12.6 % (ref 11.5–14.5)
RDW-SD: 40.8 fL (ref 35.0–51.0)
WBC: 5.1 10*3/uL (ref 4.0–11.0)

## 2023-10-24 LAB — TROPONIN T, HIGH SENSITIVITY
Troponin T, High Sensitivity: 8 ng/L (ref <12)
Troponin T, High Sensitivity: 9 ng/L (ref <12)
Troponin T, High Sensitivity: 9 ng/L (ref <12)

## 2023-10-24 LAB — BLOOD GAS, VENOUS
Base Excess. Calculated: 3 mmol/L (ref <=4)
Bicarbonate (Total CO2): 28 mmol/L (ref 20–32)
O2 Sat (Estimated): 97 %
pCO2: 47 mmHg (ref 41–51)
pH: 7.39 (ref 7.31–7.41)
pO2: 97 mmHg

## 2023-10-24 LAB — D-DIMER, QUANTITATIVE: D-DIMER: 482 ng{FEU}/mL (ref 215.00–500.00)

## 2023-10-24 LAB — FIBRINOGEN: Fibrinogen: 363 mg/dL (ref 170–440)

## 2023-10-24 MED ORDER — morphine injection 4 mg
4 | Freq: Once | INTRAVENOUS | Status: AC
Start: 2023-10-24 — End: 2023-10-24
  Administered 2023-10-24: 21:00:00 4 mg via INTRAVENOUS

## 2023-10-24 MED ORDER — acetaminophen (Tylenol) tablet 975 mg
325 | Freq: Once | ORAL | Status: AC
Start: 2023-10-24 — End: 2023-10-24
  Administered 2023-10-25: 01:00:00 975 mg via ORAL

## 2023-10-24 MED ORDER — sodium chloride 0.9 % flush 10 mL
INTRAMUSCULAR | Status: DC | PRN
Start: 2023-10-24 — End: 2023-10-24

## 2023-10-24 MED ORDER — oxyCODONE-acetaminophen (Percocet) 5-325 mg tablet
5-325 | ORAL_TABLET | Freq: Four times a day (QID) | ORAL | 0 refills | 28.00000 days | Status: AC | PRN
Start: 2023-10-24 — End: 2023-10-27

## 2023-10-24 MED ORDER — morphine injection 2 mg
4 | Freq: Once | INTRAVENOUS | Status: AC
Start: 2023-10-24 — End: 2023-10-24
  Administered 2023-10-24: 20:00:00 2 mg via INTRAVENOUS

## 2023-10-24 MED ORDER — aspirin chewable tablet 324 mg
81 | Freq: Once | ORAL | Status: AC
Start: 2023-10-24 — End: 2023-10-24
  Administered 2023-10-24: 20:00:00 324 mg via ORAL

## 2023-10-24 MED ORDER — oxyCODONE (Roxicodone) immediate release tablet 5 mg
5 | Freq: Once | ORAL | Status: AC
Start: 2023-10-24 — End: 2023-10-24
  Administered 2023-10-25: 01:00:00 5 mg via ORAL

## 2023-10-24 MED ORDER — HYDROmorphone (Dilaudid) injection 0.5 mg
1 | Freq: Once | INTRAMUSCULAR | Status: AC
Start: 2023-10-24 — End: 2023-10-24
  Administered 2023-10-24: 22:00:00 0.5 mg via INTRAVENOUS

## 2023-10-24 MED ORDER — ondansetron (Zofran) injection 4 mg
4 | Freq: Once | INTRAMUSCULAR | Status: AC
Start: 2023-10-24 — End: 2023-10-24
  Administered 2023-10-24: 20:00:00 4 mg via INTRAVENOUS

## 2023-10-24 MED FILL — ONDANSETRON HCL (PF) 4 MG/2 ML INJECTION SOLUTION: 4 4 mg/2 mL | INTRAMUSCULAR | Qty: 2 | Fill #0

## 2023-10-24 MED FILL — ASPIRIN 81 MG CHEWABLE TABLET: 81 81 mg | ORAL | Qty: 4 | Fill #0

## 2023-10-24 MED FILL — MORPHINE 4 MG/ML INTRAVENOUS SOLUTION WRAPPER: 4 4 mg/mL | INTRAVENOUS | Qty: 1 | Fill #0

## 2023-10-24 MED FILL — HYDROMORPHONE 1 MG/ML INJECTION WRAPPER: 1 1 mg/mL | INTRAMUSCULAR | Qty: 1 | Fill #0

## 2023-10-24 NOTE — Unmapped (Signed)
 [facility] - Emergency Department  PROVIDER IN TRIAGE NOTE    PT is a 48 y.o. adult PMHx notable for CAD with history of CABG x 2 (2007, 2013), T2DM on insulin, hypertension, hyperlipidemia, and a history of multiple admissions and emergency department visits for chest pain concerning for angina who presents with chest pain. Patient was at an ortho appointment when he developed left-sided chest pain. Also endorsing nausea and diaphoresis, however walked 2 blocks outside in 90 degree weather before entering the ED. Denies SOB, arm pain, vomiting.    Interpreter: No    PAST MEDICAL HISTORY / PROBLEM LIST    Medical History[1]    Problem List[2]    PAST SURGICAL HISTORY    Surgical History[3]    TRIAGE VITALS    ED Triage Vitals [10/24/23 1439]   Temp Pulse Resp BP   37.1 C (98.7 F) (!) 122 24 123/81      SpO2 Temp Source Heart Rate Source Patient Position   100 % Oral Monitor Sitting      BP Location FiO2 (%)     Left arm --            PE: Adult in no acute distress, speaking full sentences, no respiratory distress, alert and interactive, ambulatory with steady gait, diaphoretic    Triage MDM/Plan   PT is a 48 y.o. adult who presents with chest pain.  Will likely proceed with EKG, labs, CXR, and ED attending evaluation.  Please refer to the ED provider's note for further detailed plan and disposition.     Jolee Naval, [place]     Medical screening evaluation was performed in triage and workup was initiated. Patient was advised further evaluation and treatment may be required and advised not to leave the Emergency Department until completed.         [1]   Past Medical History:  Diagnosis Date    Coronary artery disease      Diabetes mellitus (Multi-HCC)     Heart attack (Multi-HCC)     x3    Hypertension      pt denies having high BP   [2]   Patient Active Problem List  Diagnosis    Chest pain    Hypokalemia    Chest pain, unspecified type    Type 2 diabetes mellitus (Multi-HCC)    Tobacco abuse    Obesity     Presence of aortocoronary bypass graft    PCR positive for hepatitis C virus     HLD (hyperlipidemia)     Essential hypertension     Congenital anomaly of artery (HHS-HCC)   [3]   Past Surgical History:  Procedure Laterality Date    ARTERIAL BYPASS SURGERY      x2    CHOLECYSTECTOMY      CORONARY ARTERY BYPASS GRAFT          Jolee Naval, [place]  10/24/23 1446

## 2023-10-24 NOTE — ED Notes (Signed)
 Pt medicated with tylenol and oxycodone prior to discharge.  Pt very concerned that Oxycodone prescription sent.  Pt reassured.   Pt provided with number to obtain a pcp and number for cardiology.  Pt discharged home, will return to ER for worsening symptoms.  Pt verbalizes understanding of discharge instructions.     Teofilo Fellers, RN  10/24/23 2045

## 2023-10-24 NOTE — ED Notes (Signed)
 See triage note. Pt a/ox4, ambulating independently w steady gait,  IV placed, labs drawn and sent. Changed into gown and placed on bedside monitor. Medications administered w positive effect (See MAR). Pt updated on POC. Pt verbalized understanding. Safety maintained. Call bell in reach.        Alysia Jumbo, RN  10/24/23 1806

## 2023-10-24 NOTE — Discharge Instructions (Addendum)
 You were seen and evaluated for chest pain.  Your evaluation in the emergency department was significant for normal EKG, chest x-ray, and serial troponins which are the heart enzymes which were all completely normal.  In addition the remainder of your labs looking for blood clots as well as other signs of inflammation/infection were normal.  As you have an allergy to nitroglycerin, you will be discharged with a few days of Percocet as needed for your chest pain.

## 2023-10-24 NOTE — Progress Notes (Cosign Needed)
 Department of Orthopaedic Surgery    CHIEF COMPLAINT: Left knee pain     HPI:   [name] is a 48 y.o. adult here for evaluation and management of left knee pain. [name] describes 7-8 months of knee pain. Denies traumatic injury. The pain is localized to the anterior, medial, and lateral knee. With  associated swelling. Denies any further radiating pain, numbness, or tingling. Denies catching or locking.Reports he has tried to do PT but the pain was too severe that the PT did not want him to continue.  He takes Tylenol and uses an ace bandage PRN. He is allergic to ibuprofen and tramadol.     PAST MEDICAL HISTORY:  Medical History[1]  Problem List[2]  PAST SURGICAL HISTORY:  Surgical History[3]  FAMILY HISTORY:  Family History[4]  SOCIAL HISTORY:  Social History     Socioeconomic History    Marital status: Single     Spouse name: Not on file    Number of children: Not on file    Years of education: Not on file    Highest education level: Not on file   Occupational History    Not on file   Tobacco Use    Smoking status: Every Day     Current packs/day: 0.50     Types: Cigarettes     Passive exposure: Current    Smokeless tobacco: Never   Vaping Use    Vaping status: Some Days    Substances: Nicotine   Substance and Sexual Activity    Alcohol use: Never    Drug use: Never    Sexual activity: Defer   Other Topics Concern    Not on file   Social History Narrative    Not on file     Social Determinants of Health     Financial Resource Strain: Low Risk  (10/22/2023)    Overall Financial Resource Strain (CARDIA)     Difficulty of Paying Living Expenses: Not hard at all   Food Insecurity: Unknown (10/22/2023)    Hunger Vital Sign     Worried About Running Out of Food in the Last Year: Never true     Ran Out of Food in the Last Year: Not on file   Transportation Needs: No Transportation Needs (10/22/2023)    PRAPARE - Therapist, art (Medical): No     Lack of Transportation (Non-Medical): No    Physical Activity: Not on file   Stress: No Stress Concern Present (05/14/2023)    Received from [facility] - Occupational Stress Questionnaire     Feeling of Stress : Only a little   Social Connections: Unknown (07/30/2019)    Received from [facility]    Social Connections     11. How often do you see or talk to people that you care about and feel close to? (Talking to friends on the phone or virtually, visiting friends/family, religious service, clubs): Not on file   Intimate Partner Violence: Not At Risk (10/21/2023)    Received from [facility]    Humiliation, Afraid, Rape, and Kick questionnaire     Fear of Current or Ex-Partner: No     Emotionally Abused: No     Physically Abused: No     Sexually Abused: No   Housing Stability: Unknown (10/22/2023)    Housing Stability Vital Sign     Unable to Pay for Housing in the Last Year:  Not on file     Number of Times Moved in the Last Year: Not on file     Homeless in the Last Year: No     ALLERGIES: Ibuprofen, Ketorolac, Nitroglycerin, Tramadol, and Codeine  MEDICATIONS:   Prior to Admission medications   Medication Sig Start Date End Date Taking? Authorizing Provider   aspirin 81 mg capsule Take 81 mg by mouth in the morning. 01/05/18   Historical Provider, MD   atorvastatin (Lipitor) 10 mg tablet Take 80 mg by mouth in the morning. 01/05/18   Historical Provider, MD   insulin glargine (Lantus) 100 unit/mL (3 mL) injection Inject 15 Units under the skin at bedtime. 12/07/21   Historical Provider, MD   lidocaine (Lidoderm) 5 % patch Apply 1 patch topically once daily. Remove & discard patch within 12 hours or as directed by MD. 10/22/23   Agatha Horsfall, PA   metoprolol succinate XL (Toprol-XL) 50 mg 24 hr tablet Take 25 mg by mouth twice daily. 12/07/21   Historical Provider, MD        REVIEW OF SYSTEMS: 10 point ROS negative except as in HPI.    PHYSICAL EXAM:    On examination [name], is a pleasant,  well-appearing adult, in no apparent distress.  Ambulates with a nonantalgic gait, without assistive device.  On examination of the left knee: There is mild effusion.  There is no erythema, ecchymosis, or bony deformity. There is diffuse tenderness  There is moderate tenderness over the medial and lateral joint lines. Range of motion 0-90 degrees. Pain limited flexion.  Calf is soft and nontender.  Fires EHL/FHL/GS/TA.  Sensation is intact to light touch in S/S/DP/SP/T nerve distributions.  Lower extremities are warm and well perfused with palpable DP and PT pulses.    IMAGING:  Radiographs of the left knee(s) were obtained and reviewed today. Significant degenerative changes to the medial compartment.     MR from 10/04/2022 reviewed   IMPRESSION:  1. Left knee lateral meniscal posterior root avulsion tear. Medial  meniscal body small radial tear or free margin fraying. Medial  meniscal posterior root subtle horizontal tear may not be probe  patent.  2. Mild MCL and ACL sprain without discrete tear.       Diagnosis Plan   1. Left knee pain  XR KNEE LEFT 4+ VIEWS          TREATMENT:  The patient was seen and evaluated by Dr. Bevin Bucks. All findings were reviewed with the patient. Patient has had 7-8 months of worsening left knee pain. 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 We discussed the history and progression of this condition with the patient. We discussed treatment options. We dicussed that a cortisone injection may be help for pain control. Patient expressed that he is not interested in cortisone injections due to a bad experience with one in the past. We also discussed that a surgical option would be a knee replacement. We recommend he follow up with Dr. Paulene Boron for a consultation. Patient reports he is not ready to discuss that yet. He can call in at any time to schedule with Dr. Paulene Boron. Answered all questions and patient agrees with the plan.     FU: Dr. Izell Marsh,  [facility]        [1]   Past Medical History:  Diagnosis Date    Coronary artery disease      Diabetes mellitus (Multi-HCC)  Heart attack (Multi-HCC)     x3    Hypertension      pt denies having high BP   [2]   Patient Active Problem List  Diagnosis    Chest pain    Hypokalemia    Chest pain, unspecified type    Type 2 diabetes mellitus (Multi-HCC)    Tobacco abuse    Obesity    Presence of aortocoronary bypass graft    PCR positive for hepatitis C virus     HLD (hyperlipidemia)     Essential hypertension     Congenital anomaly of artery (HHS-HCC)   [3]   Past Surgical History:  Procedure Laterality Date    ARTERIAL BYPASS SURGERY      x2    CHOLECYSTECTOMY      CORONARY ARTERY BYPASS GRAFT     [4]   Family History  Problem Relation Name Age of Onset    Heart disease Father

## 2023-10-24 NOTE — Other (Signed)
 Patient Education  Table of Contents   Nonspecific Chest Pain, Adult    To view videos and all your education online visit,  https://pe.elsevier.com/dgNh84Ds  or scan this QR code with your smartphone.  Access to this content will expire in one year.  Nonspecific Chest Pain, Adult  Chest pain is an uncomfortable, tight, or painful feeling in the chest. The pain can feel like a crushing, aching, or squeezing pressure. A person can feel a burning or tingling sensation. Chest pain can also be felt in your back, neck, jaw, shoulder, or arm. This pain can be worse when you move, sneeze, or take a deep breath.  Chest pain can be caused by a condition that is life-threatening. This must be treated right away. It can also be caused by something that is not life-threatening. If you have chest pain, it can be hard to know the difference, so it is important to get help right away to make sure that you do not have a serious condition.  Some life-threatening causes of chest pain include:   Heart attack.   A tear in the body's main blood vessel (aortic dissection).   Inflammation around your heart (pericarditis).   A problem in the lungs, such as a blood clot (pulmonary embolism) or a collapsed lung (pneumothorax).  Some non life-threatening causes of chest pain include:   Heartburn.   Anxiety or stress.   Damage to the bones, muscles, and cartilage that make up your chest wall.   Pneumonia or bronchitis.   Shingles infection (varicella-zoster virus).  Your chest pain may come and go. It may also be constant. Your health care provider will do tests and other studies to find the cause of your pain. Treatment will depend on the cause of your chest pain.  Follow these instructions at home:  Medicines   Take over-the-counter and prescription medicines only as told by your health care provider.   If you were prescribed an antibiotic medicine, take it as told by your health care provider. Do not stop taking the antibiotic even if you  start to feel better.  Activity   Avoid any activities that cause chest pain.   Do not lift anything that is heavier than 10 lb (4.5 kg), or the limit that you are told, until your health care provider says that it is safe.   Rest as directed by your health care provider.    Return to your normal activities only as told by your health care provider. Ask your health care provider what activities are safe for you.  Lifestyle       Do not use any products that contain nicotine or tobacco, such as cigarettes, e-cigarettes, and chewing tobacco. If you need help quitting, ask your health care provider.   Do not drink alcohol.   Make healthy lifestyle changes as recommended. These may include:  ? Getting regular exercise. Ask your health care provider to suggest some exercises that are safe for you.  ? Eating a heart-healthy diet. This includes plenty of fresh fruits and vegetables, whole grains, low-fat (lean) protein, and low-fat dairy products. A dietitian can help you find healthy eating options.  ? Maintaining a healthy weight.  ? Managing any other health conditions you may have, such as high blood pressure (hypertension) or diabetes.  ? Reducing stress, such as with yoga or relaxation techniques.  General instructions   Pay attention to any changes in your symptoms.   It is up to you to get  the results of any tests that were done. Ask your health care provider, or the department that is doing the tests, when your results will be ready.   Keep all follow-up visits as told by your health care provider. This is important.    You may be asked to go for further testing if your chest pain does not go away.  Contact a health care provider if:   Your chest pain does not go away.   You feel depressed.   You have a fever.   You notice changes in your symptoms or develop new symptoms.  Get help right away if:   Your chest pain gets worse.   You have a cough that gets worse, or you cough up blood.   You have severe pain in your  abdomen.   You faint.   You have sudden, unexplained chest discomfort.   You have sudden, unexplained discomfort in your arms, back, neck, or jaw.   You have shortness of breath at any time.   You suddenly start to sweat, or your skin gets clammy.   You feel nausea or you vomit.   You suddenly feel lightheaded or dizzy.   You have severe weakness, or unexplained weakness or fatigue.   Your heart begins to beat quickly, or it feels like it is skipping beats.  These symptoms may represent a serious problem that is an emergency. Do not wait to see if the symptoms will go away. Get medical help right away. Call your local emergency services (911 in the [place] Do not drive yourself to the hospital.  Summary   Chest pain can be caused by a condition that is serious and requires urgent treatment. It may also be caused by something that is not life-threatening.   Your health care provider may do lab tests and other studies to find the cause of your pain.   Follow your health care provider's instructions on taking medicines, making lifestyle changes, and getting emergency treatment if symptoms become worse.   Keep all follow-up visits as told by your health care provider. This includes visits for any further testing if your chest pain does not go away.  This information is not intended to replace advice given to you by your health care provider. Make sure you discuss any questions you have with your health care provider.  Document Released: 2005-02-14 Document Updated: 2022-03-22 Document Reviewed: 2022-03-22  Elsevier Patient Education ? 2025 [employer].

## 2023-10-24 NOTE — ED Provider Notes (Signed)
 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 Patient was on the [facility] today when he developed left sided chest pain. He walked across the street to the ED and noted nausea and diaphoresis at this time. On exam, patient continues to endorse left sided chest pain radiating down his left arm. He describes it as a squeezing. Patient took all his medications this morning with the exception of 4 baby aspirin he usually takes every day. Patient has a history of angina but does not currently follow with a Cardiologist. He is allergic to nitroglycerin.   Per chart review, patient has several recent admissions for chest pain. His last admission was 10/18/23 and he left AMA, per [facility] records. 09/18/23 Nuclear Medicine stress test at [facility] showed normal wall motion and 60% EF at rest, normal myocardial perfusion. While there was poor functional exercise capacity there was no ischemic EKG changes with progressive angina pain, test was stopped due to angina symptoms and dizziness, but he had no associated EKG changes with his symptoms, stress, or recovery. Echo at [facility] on 07/06/23 showed a normal EF of 65% and LVH.     History/Exam limitations: none.    Past Medical/Surgical History    Medical History[1]  Problem List[2]  Surgical History[3]    Medications    Previous Medications    ASPIRIN 81 MG CAPSULE    Take 81 mg by mouth in the morning.    ATORVASTATIN (LIPITOR) 10 MG TABLET    Take 80 mg by mouth in the morning.    INSULIN GLARGINE (LANTUS) 100 UNIT/ML (3 ML) INJECTION    Inject 15 Units under the skin at bedtime.    LIDOCAINE (LIDODERM) 5 %  PATCH    Apply 1 patch topically once daily. Remove & discard patch within 12 hours or as directed by MD.    METOPROLOL SUCCINATE XL (TOPROL-XL) 50 MG 24 HR TABLET    Take 25 mg by mouth twice daily.        Allergies    Allergies[4]    Social History    Social History     Tobacco Use    Smoking status: Every Day     Current packs/day: 0.50     Types: Cigarettes     Passive exposure: Current    Smokeless tobacco: Never   Substance Use Topics    Alcohol use: Never     Social History     Substance and Sexual Activity   Drug Use Never       Family History    Family History[5]    Review of Systems     Review of Systems   Constitutional:  Negative for chills and fever.   HENT:  Negative for ear pain and sore throat.    Eyes:  Negative for pain and visual disturbance.   Respiratory:  Negative for cough and shortness of breath.    Cardiovascular:  Positive for chest pain.   Gastrointestinal:  Negative for abdominal pain and vomiting.   Genitourinary:  Negative for dysuria and hematuria.  Musculoskeletal:  Negative for arthralgias and back pain.   Skin:  Negative for color change and rash.   Neurological:  Negative for seizures and syncope.   All other systems reviewed and are negative.        Physical Exam  ED Triage Vitals [10/24/23 1439]   Temp Pulse Resp BP   37.1 C (98.7 F) (!) 122 24 123/81      SpO2 Temp Source Heart Rate Source Patient Position   100 % Oral Monitor Sitting      BP Location FiO2 (%)     Left arm --        Head: NC/AT  Eyes: EOMI, PERRL  ENT: Moist mucous membranes, OP clear, TMs clear b/l, edentulous.   Neck: Soft and supple, full ROM, no adenopathy, no c-spine tenderness, no JVD.  Chest: No palpable tenderness, no e/o trauma. Multiple scars consistent with prior CABG, well healed surgical scar to his sternum to subxiphoid area.  Lungs: CTAB.  CVS: RRR, normal S1 and S2, very soft 2/6 murmur best heard at the left base, strong femoral pulses bilaterally.   Abdomen: Soft, nontender, no palpable  masses, no guarding or rebound.   Extremities: Full ROM of all joints, NVI, no pitting edema.   Back: No palpable tenderness, no e/o trauma.  Skin: Intact, no e/o cellulitis.  Neuro: Awake and alert, MAE, no facial asymmetry, no gross focal deficits    Labs/Imaging  Labs Reviewed   COMPREHENSIVE METABOLIC PANEL - Abnormal       Result Value    Sodium 137      Potassium 4.4      Chloride 100      CO2 (Bicarbonate) 23      Anion Gap 14      BUN 8      Creatinine 0.83      eGFRcr 110      Glucose 379 (*)     Fasting? Unknown      Calcium 10.5      AST 58 (*)     ALT 111 (*)     Alkaline phosphatase 69      Protein, total 8.4      Albumin 4.3      Bilirubin, total 0.2     D-DIMER, QUANTITATIVE - Normal    D-DIMER 482.00     FIBRINOGEN - Normal    Fibrinogen 363     TROPONIN T, HIGH SENSITIVITY - Normal    Troponin T, High Sensitivity 8     TROPONIN T, HIGH SENSITIVITY - Normal    Troponin T, High Sensitivity 9     TROPONIN T, HIGH SENSITIVITY - Normal    Troponin T, High Sensitivity 9     CBC W/DIFF    Narrative:     The following orders were created for panel order CBC and differential.  Procedure                               Abnormality         Status                     ---------                               -----------         ------  CBC w/ Differential[[id number]]                              Final result                 Please view results for these tests on the individual orders.   CBC WITH DIFFERENTIAL    WBC 5.1      RBC 4.98      Hemoglobin 14.8      Hematocrit 43.9      MCV 88.2      MCH 29.7      MCHC 33.7      RDW-CV 12.6      RDW-SD 40.8      Platelets 239      MPV 10.6      Neutrophil % 54.9      Lymphocyte % 35.0      Monocytes % 7.5      Eosinophils % 1.6      Basophils % 0.4      Immature Granulocytes % 0.6      NRBC % 0.0      Neutrophils Absolute 2.80      Lymphocytes Absolute 1.78      Monocytes Absolute 0.38      Eosinophils Absolute 0.08      Basophils Absolute 0.02       Immature Granulocytes Absolute 0.03      NRBC Absolute 0.00     BLOOD GAS, VENOUS    Source Blood, Venous      pH 7.39      pCO2 47      pO2 97      Bicarbonate (Total CO2) 28      O2 Sat (Estimated) 97      Base Excess. Calculated 3        XR CHEST 2 VIEWS   Final Result   No acute pulmonary process.      APPROVED BY STAFF RADIOLOGIST: Tobi Fortes 10/24/2023 4:05 PM EDT          X-rays independently visualized and interpreted by me:    CXR: Sternal wires, no focal infiltrates, no pleural effusions    EKG    EKG independently visualized and interpreted by me:  Sinus tach at 110, intervals of 0.16, 0.10, 0.42, axis of 60 degrees, LVH, no ischemia     ED Course  Diagnoses as of 10/24/23 2028   Chest pain, unspecified type     Medical Decision Making  Nursing notes reviewed and agreed with.    48 year old male presents for evaluation of left sided chest pain radiating down his left arm. Physical exam is as documented and patient arrives tachycardic, though took his metoprolol this morning. Labs were significant for stable repeat troponins, negative D-dimer, and glucose of 379 with a normal VBG. EKG and CXR were negative for acute pathology. Patient was treated with aspirin, morphine, dilaudid, and zofran for symptomatic management. As I do not see medical contraindication to his safe discharge, he was provided a script for percocet and recommended follow up with his PCP for continued care. Patient discharged in stable condition with return precautions. Patient understands and agrees with the plan of his discharge. All questions were answered and he is happy with his overall care. There were no untoward events during his ED course.     EXTERNAL RECORDS REVIEWED  10/18/23 admission  09/18/23 nuclear stress test  INDEPENDENT INTERPRETATIONS  X-rays and EKG independently interpreted by me, details documented in the chart above.     TEST/TREATMENT/HOSPITALIZATION CONSIDERED  Labs, EKG, CXR    CHRONIC CONDITIONS  CAD s/p  CABG x2, T2DM, HTN, HLD    ACUTE CONDITIONS  Chest pain       Final Disposition: Discharged      Clinical Impression  1. Chest pain, unspecified type        By signing my name below, I, Kaitlyn Yoon, attest that this documentation has been prepared under the direction and presence of Dr. Quentin Brunner, MD       Electronically signed: Kaitlyn Yoon, Scribe. 10/24/23 8:28 PM.    Consent to use a scribe was obtained prior to the start of the encounter by clinical staff.      Scribe attestation: I have personally performed the services described in this documentation, as written by my scribe above in my presence.  It is both accurate and complete when signed by me.  I have performed any pertinent edits.         [1]   Past Medical History:  Diagnosis Date    Coronary artery disease      Diabetes mellitus (Multi-HCC)     Heart attack (Multi-HCC)     x3    Hypertension      pt denies having high BP   [2]   Patient Active Problem List  Diagnosis    Chest pain    Hypokalemia    Chest pain, unspecified type    Type 2 diabetes mellitus (Multi-HCC)    Tobacco abuse    Obesity    Presence of aortocoronary bypass graft    PCR positive for hepatitis C virus     HLD (hyperlipidemia)     Essential hypertension     Congenital anomaly of artery (HHS-HCC)   [3]   Past Surgical History:  Procedure Laterality Date    ARTERIAL BYPASS SURGERY      x2    CHOLECYSTECTOMY      CORONARY ARTERY BYPASS GRAFT     [4]   Allergies  Allergen Reactions    Ibuprofen Anaphylaxis, Hives, Itching and Rash     Other reaction(s): Anaphylactic reaction, Unknown, Urticaria  anaphylaxis  anaphylaxis      Ketorolac Anaphylaxis, Hives and Rash     Other reaction(s): ITCHY HIVES, Unknown, Urticaria  Converted from Generic Allergy: Ketorolac  Anaphylaxis   Anaphylaxis   Tolerates ASA      Nitroglycerin      hives    Tramadol Anaphylaxis, Angioedema, Hives and Rash     Other reaction(s): THROAT CLOSES, Unknown, Urticaria  Patient confirmed that he can take Percocet,  Hydromorphone and Morphine. Edd Gong, Pharmacy  anaphylaxis  anaphylaxis      Codeine    [5]   Family History  Problem Relation Name Age of Onset    Heart disease Father          Quentin Brunner, MD  10/25/23 [phone]

## 2023-10-24 NOTE — ED Notes (Signed)
 Pt has bilat LE trace edema.  MD at the bedside performing ultrasounds of various areas.     Teofilo Fellers, RN  10/24/23 [phone]

## 2023-10-24 NOTE — ED Triage Notes (Signed)
 Patient arrives to the ED with c/o chest pain x 10 minutes while sitting at doctor appointment, +nausea, +sweating after two minute walk from doctors visit, hx of heart attack.

## 2023-10-25 MED FILL — ACETAMINOPHEN 325 MG TABLET: 325 325 mg | ORAL | Qty: 3 | Fill #0

## 2023-10-25 MED FILL — OXYCODONE 5 MG TABLET: 5 5 mg | ORAL | Qty: 1 | Fill #0

## 2023-10-29 LAB — CMP (EXT)
A/G Ratio (EXT): 0.9 ug/mL — ABNORMAL LOW (ref 1.2–2.4)
ALT/SGPT (EXT): 118 U/L — ABNORMAL HIGH (ref 7.0–40.0)
AST/SGOT (EXT): 64 U/L — ABNORMAL HIGH (ref 13–40)
Albumin (EXT): 3.4 g/dL (ref 3.4–5.0)
Alkaline Phosphatase (EXT): 55 U/L (ref 46.0–116.0)
Anion Gap (EXT): 7 mmol/L (ref 6–14)
BUN (EXT): 15 mg/dL (ref 9–23)
Bilirubin, Total (EXT): 0.3 mg/dL (ref 0.3–1.2)
CO2 (EXT): 26 mmol/L (ref 20–31)
CalciumCalcium (EXT): 9 mg/dL (ref 8.3–10.6)
Chloride (EXT): 105 mmol/L (ref 98–107)
Creatinine (EXT): 0.76 mg/dL (ref 0.73–1.18)
Globulin (EXT): 3.8 g/dL (ref 2.0–3.9)
Glucose (EXT): 383 mg/dL — ABNORMAL HIGH (ref 74–106)
Potassium (EXT): 4 mmol/L (ref 3.5–5.1)
Protein (EXT): 7.2 g/dL (ref 5.7–8.2)
Sodium (EXT): 138 mmol/L (ref 136–145)
eGFR - Creat MDRD (EXT): 113 ml/min/1.73m2 (ref >60)

## 2023-11-02 LAB — BMP (EXT)
Anion Gap (EXT): 10 (ref 5–15)
BUN (EXT): 11 mg/dL (ref 7–23)
CO2 (EXT): 24 mmol/L (ref 22–32)
CalciumCalcium (EXT): 9.1 mg/dL (ref 8.6–10.5)
Chloride (EXT): 100 mmol/L (ref 98–107)
Creatinine (EXT): 0.79 mg/dL (ref 0.60–1.30)
Glucose (EXT): 354 mg/dL — ABNORMAL HIGH (ref 65–99)
Potassium (EXT): 3.7 mmol/L (ref 3.5–5.3)
Sodium (EXT): 134 mmol/L — ABNORMAL LOW (ref 135–145)
eGFR - Creat CKD-EPI (EXT): >90 mL/min/1.73m2 (ref >=60)

## 2023-11-14 LAB — LIPID PROFILE (EXT)
Cholesterol (EXT): 171 mg/dL (ref 120–200)
HDL Cholesterol (EXT): 34 mg/dL — ABNORMAL LOW (ref 35–100)
LDL Cholesterol, CALC (EXT): 115 mg/dL (ref 60–130)
Risk Factor (EXT): 5.03 mg/dL
Triglycerides (EXT): 108 mg/dL (ref 35–150)

## 2023-11-17 DIAGNOSIS — R079 Chest pain, unspecified: Principal | ICD-10-CM

## 2023-11-17 NOTE — ED Triage Notes (Signed)
 Patient has extensive cardiac hx, MI at 48yo and 2nd at 73. Hx of CABG, had heart cath a couple of days ago which showed some blockages but did not place any stents. Having crushing chest pain today.
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
## 2023-11-18 ENCOUNTER — Inpatient Hospital Stay
Admit: 2023-11-18 | Discharge: 2023-11-18 | Disposition: A | Discharge status: Home / Self Care | Payer: MEDICAID | Attending: Emergency Medicine

## 2023-11-18 LAB — CBC WITH AUTO DIFFERENTIAL
Basophils %: 0.2 %
Basophils Absolute: 0.01 10*3/uL (ref 0.0–0.2)
Eosinophils %: 1.7 %
Eosinophils Absolute: 0.07 10*3/uL (ref 0.0–0.5)
Hematocrit: 35.8 % — ABNORMAL LOW (ref 42.0–52.0)
Hemoglobin: 11.9 g/dL — ABNORMAL LOW (ref 14.0–18.0)
Immature Granulocytes %: 0.2 %
Immature Granulocytes Absolute: 0.01 10*3/uL (ref 0.00–0.06)
Lymphocytes Absolute: 1.27 10*3/uL (ref 1.0–4.5)
Lymphocytes: 31.5 %
MCH: 29.8 pg (ref 28.0–34.0)
MCHC: 33.2 g/dL (ref 32.0–36.0)
MCV: 89.5 FL (ref 80.0–100.0)
MPV: 10.4 FL (ref 7.0–12.0)
Monocytes %: 6.7 %
Monocytes Absolute: 0.27 10*3/uL (ref 0.1–0.8)
Neutrophils Absolute: 2.39 10*3/uL (ref 1.9–7.8)
Nucleated RBCs: 0 /100{WBCs} (ref 0.0–1.0)
Platelets: 195 10*3/uL (ref 150–400)
RBC: 4 M/uL — ABNORMAL LOW (ref 4.50–6.00)
RDW: 12.7 % (ref 11.5–13.5)
Seg Neutrophils: 59.7 %
WBC: 4 10*3/uL — ABNORMAL LOW (ref 4.8–10.8)

## 2023-11-18 LAB — COMPREHENSIVE METABOLIC PANEL W/ REFLEX TO MG FOR LOW K
ALT: 92 U/L — ABNORMAL HIGH (ref 3–35)
AST: 52 U/L — ABNORMAL HIGH (ref 15–40)
Albumin/Globulin Ratio: 1.1
Albumin: 3.9 g/dL (ref 3.5–5.7)
Alk Phosphatase: 52 U/L (ref 34–104)
Anion Gap: 7 mmol/L (ref 3–16)
BUN/Creatinine Ratio: 12
BUN: 13 mg/dL (ref 7–25)
CO2: 30 mmol/L (ref 20–32)
Calcium: 9.4 mg/dL (ref 8.6–10.3)
Chloride: 98 mmol/L (ref 98–107)
Creatinine: 1.06 mg/dL (ref 0.7–1.3)
Est, Glom Filt Rate: 88 mL/min/{1.73_m2}
Globulin: 3.4 g/dL
Glucose: 448 mg/dL — ABNORMAL HIGH (ref 70–99)
Potassium: 4 mmol/L (ref 3.4–4.5)
Sodium: 135 mmol/L — ABNORMAL LOW (ref 136–145)
Total Bilirubin: 0.3 mg/dL (ref 0.1–1.2)
Total Protein: 7.3 g/dL (ref 6.0–8.3)

## 2023-11-18 LAB — POCT GLUCOSE: POC Glucose: 331 mg/dL

## 2023-11-18 LAB — TROPONIN
Troponin, High Sensitivity: <5 pg/mL (ref 0–20)
Troponin, High Sensitivity: <5 pg/mL (ref 0–20)

## 2023-11-18 MED ORDER — DIPHENHYDRAMINE HCL 50 MG/ML IJ SOLN
50 | INTRAMUSCULAR | Status: AC
Start: 2023-11-18 — End: 2023-11-18
  Administered 2023-11-18: 04:00:00 25 mg via INTRAVENOUS

## 2023-11-18 MED ORDER — PANTOPRAZOLE SODIUM 40 MG IV SOLR
40 | INTRAVENOUS | Status: AC
Start: 2023-11-18 — End: 2023-11-18
  Administered 2023-11-18: 04:00:00 40 mg via INTRAVENOUS

## 2023-11-18 MED ORDER — INSULIN REGULAR HUMAN 100 UNIT/ML IJ SOLN
100 | INTRAMUSCULAR | Status: AC
Start: 2023-11-18 — End: 2023-11-18
  Administered 2023-11-18: 04:00:00 8 [IU] via INTRAVENOUS

## 2023-11-18 MED ORDER — DROPERIDOL 2.5 MG/ML IJ SOLN
2.5 | INTRAMUSCULAR | Status: AC
Start: 2023-11-18 — End: 2023-11-18
  Administered 2023-11-18: 04:00:00 1.25 mg via INTRAVENOUS

## 2023-11-18 MED FILL — DROPERIDOL 2.5 MG/ML IJ SOLN: 2.5 mg/mL | INTRAMUSCULAR | Qty: 2 | Fill #0

## 2023-11-18 MED FILL — SODIUM CHLORIDE (PF) 0.9 % IJ SOLN: 0.9 % | INTRAMUSCULAR | Qty: 10 | Fill #0

## 2023-11-18 MED FILL — HUMULIN R 100 UNIT/ML IJ SOLN: 100 [IU]/mL | INTRAMUSCULAR | Qty: 1 | Fill #0

## 2023-11-18 MED FILL — PANTOPRAZOLE SODIUM 40 MG IV SOLR: 40 mg | INTRAVENOUS | Qty: 40 | Fill #0

## 2023-11-18 MED FILL — DIPHENHYDRAMINE HCL 50 MG/ML IJ SOLN: 50 mg/mL | INTRAMUSCULAR | Qty: 1 | Fill #0

## 2023-11-18 NOTE — Discharge Instructions (Signed)
 Your blood work tonight was completely normal as there is simply no evidence that your chest pain is related to a cardiac condition at this time    As we discussed you might consider taking a medication for reflux as chronic recurrent chest pain that is not cardiac in nature often is GI this may benefit you, prevent future ER visits    Discussed ongoing concerns with your regular physician

## 2023-11-22 LAB — EKG 12-LEAD
Atrial Rate: 68 ms
EKG I-40 FRONT AXIS: 56 deg
EKG I-40 HORIZONTAL AXIS: 79 deg
EKG P DURATION: 127 ms
EKG P FRONT AXIS: 41 deg
EKG P HORIZONTAL AXIS: 3 deg
EKG Q ONSET: 499 ms
EKG QRS AXIS: 66 deg
EKG QRS HORIZONTAL AXIS: -43 deg
EKG QRSD INTERVAL: 102 ms
EKG QTCB: 400 ms
EKG QTCF: 393 ms
EKG RR INTERVAL: 896 ms
EKG S-T FRONT AXIS: 87 deg
EKG S-T HORIZONTAL AXIS: 105 deg
EKG T HORIZONTAL AXIS: 87 deg
EKG T WAVE AXIS: 91 deg
EKG T-40 FRONT AXIS: 99 deg
EKG T-40 HORIZONTAL AXIS: -57 deg
Heart Rate: 67 {beats}/min
P-R Interval: 176 ms
Q-T Interval: 379 ms

## 2023-12-14 ENCOUNTER — Inpatient Hospital Stay: Admit: 2023-12-14 | Discharge: 2023-12-15 | Payer: MEDICAID | Arrived: VH

## 2023-12-14 NOTE — ED Notes (Signed)
 Went to room pt, registration informed this RN that pt had left.  He refused to sign any paperwork and ambulated out of the department with a steady gait.

## 2023-12-14 NOTE — ED Triage Notes (Addendum)
 Pt presents with c/o left sided chest pain that started around an hour PTA.  Pt reports it radiates to his left shoulder and jaw.  States he was laying down for this.  Has been seen multiple times for r/t complaints.  Reports a hx of MI but I just moved back here from [place] and does not currently have a cardiologist.  Was previously established with [facility].  Is taking his medications metoprolol, insulin  and baby aspirin as prescribed.  Pt is sitting in NAD, speaking in full clear sentences, no diaphoresis noted.  Pt reports he is allergic to Nitro.     EKG done in triage and given to Dr. Bartley for review

## 2023-12-14 NOTE — ED Notes (Signed)
 Labs drawn and sent for processing.

## 2024-02-23 NOTE — ED Provider Notes (Signed)
 Formatting of this note is different from the original.  EMERGENCY DEPARTMENT ENCOUNTER    CHIEF COMPLAINT    Chief Complaint   Patient presents with   ? Chest Pain     HPI    [name] is a 48 y.o. male who presents on 02/23/2024  2:45 AM for evaluation of chest pain.  HPI provided by patient.  States that he was on a train from [employer] when he began to experience left-sided chest discomfort radiating to the left arm.  Patient feeling slightly short of breath.  Continues to have discomfort.  History of CAD status post CABG in 2007, diabetes, dyslipidemia.     PAST MEDICAL HISTORY    Past Medical History:   Diagnosis Date   ? Coronary artery disease    ? Hyperlipidemia    ? Hypertension    ? Type 2 diabetes mellitus (CMS/HCC)     Now diet controlled     SURGICAL HISTORY    Surgical History[1]    CURRENT MEDICATIONS    Medications Ordered Prior to Encounter[2]    ALLERGIES    Allergies[3]    FAMILY HISTORY    Family History   Problem Relation Name Age of Onset   ? Heart disease Mother     ? Heart disease Father       SOCIAL HISTORY    Social History[4]    REVIEW OF SYSTEMS    All other systems reviewed and negative except as per history of present illness.    PHYSICAL EXAM    Vital Signs: BP (!) 155/91 (BP Location: Left arm, Patient Position: Sitting)   Pulse 70   Temp 98.2 F (36.8 C) (Oral)   Resp 16   Ht 6' 3 (1.905 m)   Wt 113.4 kg (250 lb)   SpO2 99%   BMI 31.25 kg/m    Constitutional:   no painful distress  HENT:  Normocephalic, Atraumatic, Bilateral external ears normal, Oropharynx moist.  Neck: No tenderness,  No JVD.  Eyes:  PERRL, EOMI, Conjunctiva normal  Respiratory:   No respiratory distress, LCTAB  Cardiovascular:  Normal heart rate, regular rhythm  Abdominal:  Soft,no tenderness  Musculoskeletal:   No edema, No deformities noted. Distal pulses 2+  Skin:  Warm, Dry, No rash.   Lymphatic:  No lymphadenopathy noted.   Neurological:  Alert & awake, No focal deficits noted, Gait:  Steady  Psychiatric: Appropriate for situation, age and time    EKG :(Interpreted by myself contemporaneously) normal sinus rhythm, no acute ischemic changes, normal intervals, no change when compared to previous    Cardiac Monitor: Sinus rhythm without ectopy    RADIOLOGY  Independent Interpretation of radiographs  Chest x-ray shows no acute process as read by me    LABS:    Recent Results (from the past 24 hours)   Comprehensive metabolic panel    Collection Time: 02/23/24  3:56 AM   Result Value Ref Range    Glucose 280 (H) 70 - 100 mg/dL    BUN 9 6 - 19 mg/dL    Creatinine 0.7 0.4 - 1.2 mg/dL    eGFR >39.99 >39.99 fO/fpw/8.26f*7    Sodium 140 135 - 145 mmol/L    Potassium 3.8 3.4 - 5.1 mmol/L    Chloride 103 98 - 109 mmol/L    CO2 26 22 - 29 mmol/L    Anion Gap 11 6 - 16 mmol/L    Calcium 9.6 8.5 - 10.5 mg/dL  Total Bilirubin 0.3 0.2 - 1.2 mg/dL    Alkaline Phosphatase 55 40 - 129 U/L    ALT (SGPT) 78 (H) 0 - 40 U/L    AST 52 (H) 0 - 37 U/L    Total Protein 7.6 6.0 - 8.5 g/dL    Albumin 4.1 3.3 - 5.2 g/dL    Estimated Creatinine Clearance 179.2 mL/min   Troponin T- hs Gen5    Collection Time: 02/23/24  3:56 AM   Result Value Ref Range    Troponin T- hs Gen5 11 <6 - 12 ng/L   CBC with auto differential    Collection Time: 02/23/24  3:56 AM   Result Value Ref Range    WBC 4.2 (L) 4.5 - 10.8 10*3 l    RBC 4.57 (L) 4.70 - 6.10 10*6 l    Hemoglobin 13.6 (L) 14.0 - 18.0 g/dL    Hematocrit 59.3 (L) 42.0 - 52.0 %    MCV 89 80 - 95 fL    MCH 29.8 25.4 - 39.0 pg    MCHC 33.5 31.0 - 37.0 g/dL    RDW 88.0 88.4 - 85.4 %    Platelets 206 150 - 450 10*3 l    MPV 10.8 7.0 - 11.0 fL    Neutrophils % 53.1 40.0 - 80.0 %    Lymphocytes % 38.1 20.0 - 40.0 %    Monocytes % 6.5 2.0 - 10.0 %    Eosinophils % 1.4 1.0 - 6.0 %    Basophils % 0.2 0.0 - 1.0 %    Immature Granulocyte % 0.7 0.0 - 0.9 %    Neutrophils Absolute 2.20 2.00 - 7.00 K/mm3    Absolute Immature Granulocyte 0.03 0.00 - 0.09 K/mm3    Lymphocytes Absolute 1.58 1.00  - 3.00 K/mm3    Monocytes Absolute 0.27 0.20 - 1.00 K/mm3    Eosinophils Absolute 0.06 0.00 - 0.50 K/mm3    Basophils Absolute 0.01 0.00 - 0.10 K/mm3     ED COURSE & MEDICAL DECISION MAKING    [name] is a 48 y.o. male who presents to the ED with concerns of chest pain.   ED and nursing records reviewed.  Additional pertinent available records reviewed: Multiple visits to multiple hospital systems over the last few months under similar circumstances,. Brief recent history on chart review:   09/18/23: [facility] - NM stress test normal  09/19/23: [facility] - chest pain. Declined opioids and left AMA.   5/8-09/27/2023: Admitted to [facility] with chest pain after multiple recent visits for such. Demanded doses of dilaudid  and threatened to leave AMA. Denied having recent stress test. Cardiology consult did not feel as though further cardiac work-up indicated  10/06/23: [name] - chest pain and code stroke. Requested dilaudid  and offered alternatives. Patient left AMA.   10/18/23: [facility] - chest pain. Cardiac work-up reassuring. Admitted for [facility]CP and left AMA  10/21/23: [facility] - chest pain. Work-up reassuring, pain improved with morphine  and discharged.   10/21/23: [facility] - chest pain and eloped.   10/22/23: [facility] - left knee pain at which time it was noted 6 different providers prescribed Percocet  over previous 6 months and discharged with conservative management and ortho referral  10/24/23: [facility] - chest pain and discharged with Percocet .   10/28/23: [facility] - chest pain   10/29/23: [facility] - chest pain with reassuring work-up  10/30/23: [facility] - chest pain. Asked for Dilaudid . Reassuring work-up and discharged.  10/30/23: [facility] - ER for chest pain  11/01/23: [facility] - chest pain. Reassuring work-up and discharged  11/05/23: [facility] - chest pain and admitted to [facility]U for coronary CTA but with reported nitro allergy this was contraindicated. Cardiology evaluated the patient and felt  he was safe for discharge without further work-up. Started on Amlodipine. Requested Percocet  or Vicodin.  11/06/23: [facility]- chest pain. Requested pain medication.   11/12/23: [facility] - chest pain with reassuring work-up  11/15/23: [facility] - Cardiac cath documented with clean coronary arteries  11/17/23: [facility] - chest pain with reassuring work-up  11/17/23: [facility] - chest pain with reassuring work-up  12/14/23: [facility] - chest pain and eloped      On initial presentation exam notable for hypertensive    Differential includes but not limited to ACS, aortic dissection, pneumonia, pneumothorax, myofascial pain syndrome, pulmonary embolism, lingering.  Initial plan for EKG, labs, chest x-ray and reassess.      Discussion of management with physician, qualified health professional, and/or appropriate source: Not applicable    Escalation of care to admission or observation considered: Although not medically indicated at this time    Consideration of treatments or testing, not performed: CTA chest but no historical features, chest x-ray findings or exam findings to raise high suspicion for thromboembolic event or dissection therefore not emergently pursued    Chronic Conditions affecting care include: As above    Social Determinants of Health significantly affecting the care and disposition of this patient include: None    ED Course as of 02/23/24 0557   Sun Feb 23, 2024   0556 Patient reporting a few hours of discomfort, twelve-lead stable, troponin not detectable, chest x-ray clear.  Continues to have some discomfort.  Discussed with patient that the cause of his symptoms is not immediately clear and given the number of recent negative workups, imaging studies, catheterization and stress test do not think that admission for further investigation is emergently indicated.  Regards him to follow-up with his PCP or cardiologist this week and return if symptoms worsen [JP]     ED Course User Index  [JP] Lyle Dorn Lenis, DO     FINAL IMPRESSION  1. Acute chest pain      DISPOSITION  Discharge    CONDITION  Stable    Electronically signed by: DORN LYLE, DO, 02/23/2024 5:53 AM    This Emergency Department patient encounter note was created using voice-recognition software and in real time during the ED visit. Please excuse any typographical errors that have not been edited out.     Note to patient: The 21st century cures act makes medical notes like these available to patients in the interest of transparency. However, be advised this is a medical document. It is intended primarily as corporate investment banker. It is written in medical language and may contain abbreviations or verbiage that are unfamiliar. It may appear blunt or direct. This is because medical documents are intended to carry relevant information, facts as evident, and the clinical opinion of the practitioner only as of the time of writing.         [1]  Past Surgical History:  Procedure Laterality Date   ? CORONARY ARTERY BYPASS GRAFT      x2   ? PR CATH PLACEMENT & NJX CORONARY ART ANGIO IMG S&I N/A 11/15/2023    Procedure: Coronary Angiography;  Surgeon: Denmark, David Martin, MD;  Location: [facility] CARDIAC CATH LABS;  Service: Cardiovascular   ? PR  CATH PLMT & NJX CORONARY ART/GRFT ANGIO IMG S&I N/A 11/15/2023    Procedure: Bypass Graft Study;  Surgeon: Denmark, David Martin, MD;  Location: [facility] CARDIAC CATH LABS;  Service: Cardiovascular   [2]  No current facility-administered medications on file prior to encounter.     Current Outpatient Medications on File Prior to Encounter   Medication Sig Dispense Refill   ? oxyCODONE -acetaminophen  (PERCOCET ) 5-325 MG per tablet Take 1 tablet by mouth every eight hours as needed for pain 7-10 10 each 0   ? aspirin  81 MG chewable tablet Chew 81 mg every morning     ? Insulin  Glargine 100 UNIT/ML solution pen-injector Inject 20 Units under the skin nightly     ? metoprolol  tartrate (LOPRESSOR ) 50 MG tablet Take 50  mg by mouth two times a day     ? atorvastatin  (LIPITOR ) 80 MG tablet Take 80 mg by mouth every evening     [3]  Allergies  Allergen Reactions   ? Coconut Fatty Acid Anaphylaxis   ? Codeine Anaphylaxis, Hives, Swelling and Rash     Patient states he is not allergic to codeine    ? Tramadol Hives and Rash   ? Ibuprofen Hives, Rash and Nausea/Vomiting   ? Nitroglycerin Rash     Patient states passess out  States drops his BP too much    ? Nsaids    ? Tolmetin Rash   ? Ketorolac Hives and Rash   [4]  Social History  Socioeconomic History   ? Marital status: Single   Tobacco Use   ? Smoking status: Every Day     Current packs/day: 0.25     Types: Cigarettes   ? Smokeless tobacco: Never   Vaping Use   ? Vaping status: Every Day   Substance and Sexual Activity   ? Alcohol  use: Not Currently   ? Sexual activity: Defer     Social Drivers of Health     Financial Resource Strain: Low Risk  (11/12/2023)    Received from [facility]    Overall Financial Resource Strain (CARDIA)    ? How hard is it for you to pay for the very basics like food, housing, medical care, and heating?: Not very hard   Food Insecurity: No Food Insecurity (02/19/2024)    Received from [facility]    Hunger Vital Sign    ? Worried About Programme Researcher, Broadcasting/film/video in the Last Year: Never true    ? Ran Out of Food in the Last Year: Never true   Transportation Needs: No Transportation Needs (11/12/2023)    Received from [facility]    PRAPARE - Transportation    ? In the past 12 months, has lack of transportation kept you from medical appointments or from getting medications?: No    ? In the past 12 months, has lack of transportation kept you from meetings, work, or from getting things needed for daily living?: No   Physical Activity: Not on File (07/15/2019)    Received from [facility]    Physical Activity    ? Physical Activity: 0   Stress: No Stress Concern Present (05/14/2023)    Received from [facility]  of Occupational Health - Occupational Stress Questionnaire    ? Feeling of Stress : Only a little    Received from [facility]    Social Connections   Intimate Partner Violence: Not At Risk (02/19/2024)  Received from [facility], Afraid, Rape, and Kick questionnaire    ? Fear of Current or Ex-Partner: No    ? Emotionally Abused: No    ? Physically Abused: No    ? Sexually Abused: No   Housing Stability: Unknown (11/12/2023)    Received from [facility]    Housing Stability Vital Sign    ? Unable to Pay for Housing in the Last Year: No    ? Homeless in the Last Year: No     Lyle Dorn Lenis, [place]  02/23/24 [id number]    Electronically signed by Lyle Dorn Lenis, DO at 02/23/2024  5:57 AM EDT

## 2024-02-23 NOTE — ED Notes (Signed)
 Formatting of this note might be different from the original.  Verbal permission obtained from patient for ultrasound IV insertion, risks of arterial puncture, infection, incorrect placement, and bleeding discussed. Hand hygiene performed, sterile barrier applied to probe, skin prepped with chloroprep. Successful placement of 18g long IV in L upper arm, placement confirmed by visualization of saline flush in vessel on US  and positive venous blood return.    U/s utilized after multiple attempts by other RN and pt requiring u/s lines previously.     Nidia Omar PARAS, RN  02/23/24 0403    Electronically signed by Nidia Omar PARAS, RN at 02/23/2024  4:03 AM EDT

## 2024-02-25 LAB — BMP (EXT)
Anion Gap (EXT): 10 (ref 5–15)
Anion Gap (EXT): 10 (ref 5–15)
BUN (EXT): 10 mg/dL (ref 7–23)
BUN (EXT): 9 mg/dL (ref 7–23)
CO2 (EXT): 24 mmol/L (ref 22–32)
CO2 (EXT): 26 mmol/L (ref 22–32)
CalciumCalcium (EXT): 9 mg/dL (ref 8.6–10.5)
CalciumCalcium (EXT): 9.1 mg/dL (ref 8.6–10.5)
Chloride (EXT): 102 mmol/L (ref 97–110)
Chloride (EXT): 102 mmol/L (ref 97–110)
Creatinine (EXT): 0.88 mg/dL (ref 0.60–1.30)
Creatinine (EXT): 0.88 mg/dL (ref 0.60–1.30)
Glucose (EXT): 336 mg/dL — ABNORMAL HIGH (ref 65–99)
Glucose (EXT): 402 mg/dL — ABNORMAL HIGH (ref 65–99)
Potassium (EXT): 4.3 mmol/L (ref 3.5–5.3)
Potassium (EXT): 4.2 mmol/L (ref 3.5–5.3)
Sodium (EXT): 136 mmol/L (ref 135–145)
Sodium (EXT): 138 mmol/L (ref 135–145)
eGFR - Creat CKD-EPI (EXT): >90 mL/min/1.73m2 (ref >=60)
eGFR - Creat CKD-EPI (EXT): >90 mL/min/1.73m2 (ref >=60)

## 2024-03-04 LAB — BMP (EXT)
Anion Gap (EXT): 10 (ref 3–13)
BUN (EXT): 12 mg/dL (ref 6–24)
BUN/CREAT Ratio (EXT): 15
CO2 (EXT): 27 meq/L (ref 20–29)
CalciumCalcium (EXT): 9.5 mg/dL (ref 8.4–10.2)
Chloride (EXT): 100 meq/L (ref 98–110)
Creatinine (EXT): 0.8 mg/dL (ref 0.64–1.27)
Glucose (EXT): 339 mg/dL — ABNORMAL HIGH (ref 67–99)
Potassium (EXT): 4.4 meq/L (ref 3.6–5.1)
Sodium (EXT): 137 meq/L (ref 135–145)
eGFR - Creat CKD-EPI (EXT): 110 mL/min/1.73m exp2 (ref >90)

## 2024-03-04 NOTE — ED Provider Notes (Signed)
 History     Chief Complaint   Patient presents with   . Chest Pain     HPI  48 year old male presented to the ER stating that he started with some chest pressure this morning, had some stabbing pain at times, and he had pain into his left jaw and left arm.  No shortness of breath at any point in time.  History of double bypass, and other cardiac tori.  He has stents in place.  Patient has not seen a cardiologist since 2020.  He has been taking his medications, although he did not take his aspirin  this morning.  No abdominal pain, diarrhea, fever, chills, etc.  He has had some nausea.      Past Medical History:   Diagnosis Date   . Coronary artery disease    . Diabetes mellitus (CMS/HCC)     Type 2   . History of heart attack     MI x3   . Hypercholesteremia    . Hypertension        Past Surgical History:   Procedure Laterality Date   . CARDIAC CATHETERIZATION     . CHOLECYSTECTOMY     . CORONARY ARTERY BYPASS GRAFT      CABG x2       Family History   Problem Relation Age of Onset   . Heart disease Mother    . Heart attack Paternal Grandfather        Social History     Tobacco Use   . Smoking status: Every Day   . Smokeless tobacco: Never   . Tobacco comments:     reports 2 cigs per day   Substance Use Topics   . Alcohol  use: No   . Drug use: No       Social History     Substance and Sexual Activity   Drug Use No       Review of Systems  See HPI, ROS otherwise negative      Physical Exam   BP 164/84   Pulse 73   Temp 98.3 F (36.8 C) (Oral)   Resp 19   Ht 1.93 m (6' 4)   Wt 113.4 kg   SpO2 98%   BMI 30.43 kg/m     Physical Exam   GENERAL: NAD, non-toxic appearing  Head: Atraumatic, normocephalic  EYES: Normal external appearance, EOMI, PERRL  Neck: No LAD noted, normal ROM w/o pain  ENT: Normal external appearance of ears and nose, MMM  Cardiovascular: HRRR, no murmur, no leg edema  Respiratory: LS CTA Bilat, no respiratory distress  Abdomen: Soft/ND/NT  Extremities: MAE, no edema, full ROM  Skin: No  pallor noted, no obvious rash  Neuro: A&O x3, MAE, normal gait    Clinical Decision Support     ED Course   Medical Decision Making  Amount and/or Complexity of Data Reviewed  Labs: ordered.  Radiology: ordered.  ECG/medicine tests: ordered.    Risk  OTC drugs.  Prescription drug management.  Decision regarding hospitalization.    MDM Elements Performed:  Summary of consult(s) and discussion(s) with other healthcare professional(s):  Independent interpretation(s) of EKG, Xray, US , or CT:  History obtained from an independent historian? Indicate reason:  Non-[facility] records, notes, and tests reviewed (specify note type):  Indicate if/how SDOH impacted patient care:  Management considered but not performed (meds, test, or obs/admission):    No cardiac or PE risk factors identified.  EKG ordered to rule out any sign of  arrhythmia, STEMI, acute coronary syndrome, etc.  Chest x-ray ordered to rule out any pneumomediastinum, pneumothorax, pneumonia, etc.  Labs ordered to rule out anemia, leukocytosis, acute renal failure, abnormal glucose level, abnormal electrolytes, elevated troponins for ACS, etc.    EKG read and interpreted by me: Normal sinus rhythm at a rate of 72, normal axis, nonspecific ST-T wave abnormalities that are not very different from EKG from October 2.    14:44   Troponin is negative.  Labs are otherwise unremarkable.  EKG is not remarkable for any STEMI.  Aspirin  ordered.  Given patient is not a low risk cardiac patient by any means, patient requires admission to the hospital.  He also does not have a cardiologist to follow-up with as an outpatient since he has not seen a cardiologist in 5 years.  I spoke with hospitalist, and patient admitted to hospitalist service.    Problem List Items Addressed This Visit          Nervous and Auditory    Chest pain - Primary     Other Visit Diagnoses       History of coronary artery disease              ADMITTED TO HOSPITALIST SERVICE     Lynwood Rifino, DO  03/04/24  1502

## 2024-03-04 NOTE — ED Triage Notes (Signed)
 Pt presents c/o stabbing chest pain that radiates into his jaw and down his left arm. Denies sob. Hx of double bipass.

## 2024-03-05 LAB — BMP (EXT)
Anion Gap (EXT): 9 (ref 3–13)
BUN (EXT): 13 mg/dL (ref 6–24)
BUN/CREAT Ratio (EXT): 19
CO2 (EXT): 28 meq/L (ref 20–29)
CalciumCalcium (EXT): 9.6 mg/dL (ref 8.4–10.2)
Chloride (EXT): 99 meq/L (ref 98–110)
Creatinine (EXT): 0.7 mg/dL (ref 0.64–1.27)
Glucose (EXT): 361 mg/dL — ABNORMAL HIGH (ref 67–99)
Potassium (EXT): 4.2 meq/L (ref 3.6–5.1)
Sodium (EXT): 136 meq/L (ref 135–145)
eGFR - Creat CKD-EPI (EXT): 115 mL/min/1.73m exp2 (ref >90)

## 2024-03-06 LAB — BMP (EXT)
Anion Gap (EXT): 10 (ref 3–13)
BUN (EXT): 13 mg/dL (ref 6–24)
BUN/CREAT Ratio (EXT): 22
CO2 (EXT): 27 meq/L (ref 20–29)
CalciumCalcium (EXT): 9.7 mg/dL (ref 8.4–10.2)
Chloride (EXT): 98 meq/L (ref 98–110)
Creatinine (EXT): 0.6 mg/dL — ABNORMAL LOW (ref 0.64–1.27)
Glucose (EXT): 328 mg/dL — ABNORMAL HIGH (ref 67–99)
Potassium (EXT): 4 meq/L (ref 3.6–5.1)
Sodium (EXT): 135 meq/L (ref 135–145)
eGFR - Creat CKD-EPI (EXT): >120 mL/min/1.73m exp2 (ref >90)

## 2024-03-09 ENCOUNTER — Emergency Department: Admit: 2024-03-10 | Payer: MEDICAID

## 2024-03-09 DIAGNOSIS — R079 Chest pain, unspecified: Secondary | ICD-10-CM

## 2024-03-09 NOTE — ED Triage Notes (Addendum)
 Patient reports he was at work and began having chest pain about 20 min PTA. Squeezing and radiating down left arm. Reports significant cardiac hx, MI x 3, bypass x 2. VSS.

## 2024-03-09 NOTE — ED Provider Notes (Signed)
 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  Associated with nausea but no vomiting.  Reports some sweats.  No cough, fever, shortness of breath.        Patient History   Medical History[1]  Surgical History[2]  Family History[3]  Social History     Tobacco Use    Smoking status: Every Day     Current packs/day: 0.50     Types: Cigarettes     Passive exposure: Current    Smokeless tobacco: Never   Vaping Use    Vaping status: Some Days    Substances: Nicotine   Substance Use Topics    Alcohol  use: Never    Drug use: Never       Review of Systems   Review of Systems    Physical Exam   ED Triage Vitals [03/09/24 2300]   Temp Pulse Resp BP   37.1 C (98.8 F) 97 16 (!) 149/97      SpO2 Temp Source Heart Rate Source Patient Position   99 % Oral -- Sitting      BP Location FiO2 (%)     Left arm --       Physical Exam  Vitals and nursing note reviewed.   Constitutional:       General: Theophilus is not in acute distress.     Appearance: Lamere is well-developed.   HENT:      Head: Normocephalic and atraumatic.   Eyes:      Conjunctiva/sclera: Conjunctivae normal.   Cardiovascular:      Rate and Rhythm: Normal rate and regular rhythm.      Heart sounds: No murmur heard.  Pulmonary:      Effort: Pulmonary effort is normal. No respiratory distress.      Breath sounds: Normal breath sounds.   Abdominal:      Palpations: Abdomen is soft.      Tenderness: There is no abdominal tenderness.   Musculoskeletal:         General: No swelling.      Cervical back: Neck supple.   Skin:     General: Skin is warm and dry.      Capillary Refill: Capillary  refill takes less than 2 seconds.   Neurological:      Mental Status: Joud is alert.   Psychiatric:         Mood and Affect: Mood normal.       XR CHEST 2 VIEWS   Final Result   1. No acute pathology.      Slobodan Miseljic, MD 03/09/2024 11:31 PM          Labs Reviewed   COMPREHENSIVE METABOLIC PANEL - Abnormal       Result Value    Sodium 138      Potassium 3.8      Chloride 104      CO2 (Bicarbonate) 30      Anion Gap 4      BUN 11      Creatinine 0.92  eGFRcr 104      Glucose 288 (*)     Fasting? No      Calcium 9.7      AST 62 (*)     ALT 123 (*)     Alkaline phosphatase 61      Protein, total 7.9      Albumin 3.7      Bilirubin, total 0.4     CBC WITH DIFFERENTIAL - Abnormal    WBC 4.5      RBC 4.71      Hemoglobin 14.3      Hematocrit 42.3      MCV 89.8      MCH 30.4      MCHC 33.8      RDW-CV 12.5      RDW-SD 41.1      Platelets 226      MPV 10.5      Neutrophil % 48.5      Lymphocyte % 40.6      Monocytes % 7.8      Eosinophils % 2.5      Basophils % 0.2      Immature Granulocytes % 0.4      NRBC % 0.0      Neutrophils Absolute 2.17      Lymphocytes Absolute 1.82      Monocytes Absolute 0.35 (*)     Eosinophils Absolute 0.11      Basophils Absolute 0.01      Immature Granulocytes Absolute 0.02      NRBC Absolute 0.00     TROPONIN I, HIGH SENSITIVITY - Normal    Troponin I, High Sensitivity 46      Narrative:     Clinical correlation, HEART Score and shared decision making must be taken into account.                   For Chest Pain >3 Hours    Rule-Out Criteria              Single Value     0hr/1hr Delta Value  Male  <10ng/L*          <54ng/L AND delta <15ng/L  Male    <10ng/L*          <79ng/L AND delta <15ng/L    Cannot Rule-Out                            0hr/1hr Delta Value  Male    <54ng/L AND delta >15ng/L    >/= 54 AND delta </=15ng/L  Male      <79ng/L AND delta >15ng/L    >/= 79 AND delta </=15ng/L    Rule-In Criteria               Single Value   0hr/1hr Delta Value                 Male    >115ng/L       >/=54ng/L AND delta >/=15ng/L         Male     >115ng/L       >/=79ng/L AND delta >/=15ng/L           *Note: for Chest pain <3 hours 0hr/1hr is warranted for evaluation.     MAGNESIUM - Normal    Magnesium 1.7     TROPONIN I, HIGH SENSITIVITY - Normal    Troponin I, High Sensitivity 47  Narrative:     Clinical correlation, HEART Score and shared decision making must be taken into account.                   For Chest Pain >3 Hours    Rule-Out Criteria              Single Value     0hr/1hr Delta Value  Male  <10ng/L*          <54ng/L AND delta <15ng/L  Male    <10ng/L*          <79ng/L AND delta <15ng/L    Cannot Rule-Out                            0hr/1hr Delta Value  Male    <54ng/L AND delta >15ng/L    >/= 54 AND delta </=15ng/L  Male      <79ng/L AND delta >15ng/L    >/= 79 AND delta </=15ng/L    Rule-In Criteria               Single Value   0hr/1hr Delta Value                 Male   >115ng/L       >/=54ng/L AND delta >/=15ng/L         Male     >115ng/L       >/=79ng/L AND delta >/=15ng/L           *Note: for Chest pain <3 hours 0hr/1hr is warranted for evaluation.     CBC W/DIFF    Narrative:     The following orders were created for panel order CBC and differential.  Procedure                               Abnormality         Status                     ---------                               -----------         ------                     CBC w/ Differential[[id number]]          Abnormal            Final result                 Please view results for these tests on the individual orders.       Glasgow Coma Scale Score: 15      ED Course & MDM   Diagnoses as of 03/10/24 0326   Chest pain, unspecified type       Medical Decision Making  The patient was brought back and examined by me.  EKG was obtained and interpreted by me.  It shows a normal sinus rhythm without ST elevation.  No sign of acute infarct or ischemia.  Chest x-ray was obtained and was negative for any acute cardiopulmonary  disease.  Blood was sent for evaluation as well as significant abnormality to explain the patient's presentation requiring acute invention.  Previous cardiac catheterization was reviewed as well as recent echo.  Last  cardiac catheterization without evidence of left or right sided disease.  Last echocardiogram without significant abnormality either.  IV pain medication provided.  Based on the temporal proximity of his onset of symptoms to arrival in the emergency department, a repeat troponin was obtained after 3 hours, and shows no troponin elevation.  Patient is felt to be stable for discharge.    Amount and/or Complexity of Data Reviewed  External Data Reviewed: notes.  Labs: ordered. Decision-making details documented in ED Course.  Radiology: ordered and independent interpretation performed. Decision-making details documented in ED Course.  ECG/medicine tests: ordered and independent interpretation performed. Decision-making details documented in ED Course.    Risk  Prescription drug management.        Patient was screened for Social Drivers of Health during this encounter and found to have concerns in the following domains:   transportation insecurity    The After Visit Summary provided to the patient includes information on how to find resources related to these needs.                        [1]   Past Medical History:  Diagnosis Date    Coronary artery disease      Diabetes mellitus (Multi-HCC)     Heart attack (Multi-HCC)     x3    Hypertension      pt denies having high BP   [2]   Past Surgical History:  Procedure Laterality Date    ARTERIAL BYPASS SURGERY      x2    CHOLECYSTECTOMY      CORONARY ARTERY BYPASS GRAFT     [3]   Family History  Problem Relation Name Age of Onset    Heart disease Father          Garnette Ned, MD  03/10/24 [phone]

## 2024-03-10 ENCOUNTER — Inpatient Hospital Stay
Admit: 2024-03-10 | Discharge: 2024-03-10 | Disposition: A | Discharge status: Home / Self Care | Payer: MEDICAID | Attending: Emergency Medicine

## 2024-03-10 LAB — COMPREHENSIVE METABOLIC PANEL
ALT: 123 U/L — ABNORMAL HIGH (ref 0–55)
AST: 62 U/L — ABNORMAL HIGH (ref 6–42)
Albumin: 3.7 g/dL (ref 3.2–5.0)
Alkaline phosphatase: 61 U/L (ref 30–130)
Anion Gap: 4 mmol/L (ref 3–14)
BUN: 11 mg/dL (ref 6–24)
Bilirubin, total: 0.4 mg/dL (ref 0.2–1.2)
CO2 (Bicarbonate): 30 mmol/L (ref 20–32)
Calcium: 9.7 mg/dL (ref 8.5–10.5)
Chloride: 104 mmol/L (ref 98–110)
Creatinine: 0.92 mg/dL (ref 0.55–1.30)
Glucose: 288 mg/dL — ABNORMAL HIGH (ref 70–110)
Potassium: 3.8 mmol/L (ref 3.6–5.2)
Protein, total: 7.9 g/dL (ref 6.0–8.4)
Sodium: 138 mmol/L (ref 135–146)
eGFRcr: 104 mL/min/1.73m*2 (ref >=60)

## 2024-03-10 LAB — CBC WITH DIFFERENTIAL
Basophils %: 0.2 %
Basophils Absolute: 0.01 K/uL (ref 0.00–0.22)
Eosinophils %: 2.5 %
Eosinophils Absolute: 0.11 K/uL (ref 0.00–0.50)
Hematocrit: 42.3 % (ref 32.0–53.0)
Hemoglobin: 14.3 g/dL (ref 13.0–17.5)
Immature Granulocytes %: 0.4 %
Immature Granulocytes Absolute: 0.02 K/uL (ref 0.00–0.10)
Lymphocyte %: 40.6 %
Lymphocytes Absolute: 1.82 K/uL (ref 0.70–4.00)
MCH: 30.4 pg (ref 26.0–34.0)
MCHC: 33.8 g/dL (ref 31.0–37.0)
MCV: 89.8 fL (ref 80.0–100.0)
MPV: 10.5 fL (ref 9.1–12.4)
Monocytes %: 7.8 %
Monocytes Absolute: 0.35 K/uL — ABNORMAL LOW (ref 0.36–0.83)
NRBC %: 0 % (ref 0.0–0.0)
NRBC Absolute: 0 K/uL (ref 0.00–2.00)
Neutrophil %: 48.5 %
Neutrophils Absolute: 2.17 K/uL (ref 1.50–7.95)
Platelets: 226 K/uL (ref 150–400)
RBC: 4.71 M/uL (ref 4.20–5.90)
RDW-CV: 12.5 % (ref 11.5–14.5)
RDW-SD: 41.1 fL (ref 35.0–51.0)
WBC: 4.5 K/uL (ref 4.0–11.0)

## 2024-03-10 LAB — RAINBOW DRAW SST GOLD TOP

## 2024-03-10 LAB — LIGHT BLUE TOP

## 2024-03-10 LAB — MAGNESIUM: Magnesium: 1.7 mg/dL (ref 1.6–2.6)

## 2024-03-10 LAB — TROPONIN I, HIGH SENSITIVITY
Troponin I, High Sensitivity: 46 ng/L
Troponin I, High Sensitivity: 47 ng/L

## 2024-03-10 MED ORDER — acetaminophen (Ofirmev) injection 1,000 mg
1000 | Freq: Once | INTRAVENOUS | Status: AC
Start: 2024-03-10 — End: 2024-03-10
  Administered 2024-03-10: 07:00:00 1000 mg via INTRAVENOUS

## 2024-03-10 MED ORDER — ondansetron (Zofran) injection 4 mg
4 | Freq: Once | INTRAMUSCULAR | Status: AC
Start: 2024-03-10 — End: 2024-03-10
  Administered 2024-03-10: 07:00:00 4 mg via INTRAVENOUS

## 2024-03-10 MED FILL — ONDANSETRON HCL (PF) 4 MG/2 ML INJECTION SOLUTION: 4 4 mg/2 mL | INTRAMUSCULAR | Qty: 2 | Fill #0

## 2024-03-10 MED FILL — ACETAMINOPHEN 1,000 MG/100 ML (10 MG/ML) INTRAVENOUS SOLUTION: 1000 1,000 mg/100 mL (10 mg/mL) | INTRAVENOUS | Qty: 100 | Fill #0

## 2024-03-10 NOTE — Other (Signed)
 Patient Education  Table of Contents   Nonspecific Chest Pain, Adult    To view videos and all your education online visit,  [email or web address]  or scan this QR code with your smartphone.  Access to this content will expire in one year.  Nonspecific Chest Pain, Adult  Chest pain is an uncomfortable, tight, or painful feeling in the chest. The pain can feel like a crushing, aching, or squeezing pressure. A person can feel a burning or tingling sensation. Chest pain can also be felt in your back, neck, jaw, shoulder, or arm. This pain can be worse when you move, sneeze, or take a deep breath.  Chest pain can be caused by a condition that is life-threatening. This must be treated right away. It can also be caused by something that is not life-threatening. If you have chest pain, it can be hard to know the difference, so it is important to get help right away to make sure that you do not have a serious condition.  Some life-threatening causes of chest pain include:   Heart attack.   A tear in the body's main blood vessel (aortic dissection).   Inflammation around your heart (pericarditis).   A problem in the lungs, such as a blood clot (pulmonary embolism) or a collapsed lung (pneumothorax).  Some non life-threatening causes of chest pain include:   Heartburn.   Anxiety or stress.   Damage to the bones, muscles, and cartilage that make up your chest wall.   Pneumonia or bronchitis.   Shingles infection (varicella-zoster virus).  Your chest pain may come and go. It may also be constant. Your health care provider will do tests and other studies to find the cause of your pain. Treatment will depend on the cause of your chest pain.  Follow these instructions at home:  Medicines   Take over-the-counter and prescription medicines only as told by your health care provider.   If you were prescribed an antibiotic medicine, take it as told by your health care provider. Do not stop taking the antibiotic even if you  start to feel better.  Activity   Avoid any activities that cause chest pain.   Do not lift anything that is heavier than 10 lb (4.5 kg), or the limit that you are told, until your health care provider says that it is safe.   Rest as directed by your health care provider.    Return to your normal activities only as told by your health care provider. Ask your health care provider what activities are safe for you.  Lifestyle       Do not use any products that contain nicotine or tobacco, such as cigarettes, e-cigarettes, and chewing tobacco. If you need help quitting, ask your health care provider.   Do not drink alcohol .   Make healthy lifestyle changes as recommended. These may include:  ? Getting regular exercise. Ask your health care provider to suggest some exercises that are safe for you.  ? Eating a heart-healthy diet. This includes plenty of fresh fruits and vegetables, whole grains, low-fat (lean) protein, and low-fat dairy products. A dietitian can help you find healthy eating options.  ? Maintaining a healthy weight.  ? Managing any other health conditions you may have, such as high blood pressure (hypertension) or diabetes.  ? Reducing stress, such as with yoga or relaxation techniques.  General instructions   Pay attention to any changes in your symptoms.   It is up to you to get  the results of any tests that were done. Ask your health care provider, or the department that is doing the tests, when your results will be ready.   Keep all follow-up visits as told by your health care provider. This is important.    You may be asked to go for further testing if your chest pain does not go away.  Contact a health care provider if:   Your chest pain does not go away.   You feel depressed.   You have a fever.   You notice changes in your symptoms or develop new symptoms.  Get help right away if:   Your chest pain gets worse.   You have a cough that gets worse, or you cough up blood.   You have severe pain in your  abdomen.   You faint.   You have sudden, unexplained chest discomfort.   You have sudden, unexplained discomfort in your arms, back, neck, or jaw.   You have shortness of breath at any time.   You suddenly start to sweat, or your skin gets clammy.   You feel nausea or you vomit.   You suddenly feel lightheaded or dizzy.   You have severe weakness, or unexplained weakness or fatigue.   Your heart begins to beat quickly, or it feels like it is skipping beats.  These symptoms may represent a serious problem that is an emergency. Do not wait to see if the symptoms will go away. Get medical help right away. Call your local emergency services (911 in the [place] Do not drive yourself to the hospital.  Summary   Chest pain can be caused by a condition that is serious and requires urgent treatment. It may also be caused by something that is not life-threatening.   Your health care provider may do lab tests and other studies to find the cause of your pain.   Follow your health care provider's instructions on taking medicines, making lifestyle changes, and getting emergency treatment if symptoms become worse.   Keep all follow-up visits as told by your health care provider. This includes visits for any further testing if your chest pain does not go away.  This information is not intended to replace advice given to you by your health care provider. Make sure you discuss any questions you have with your health care provider.  Document Released: 2005-02-14 Document Updated: 2022-03-22 Document Reviewed: 2022-03-22  Elsevier Patient Education ? 2025 [employer].

## 2024-03-12 ENCOUNTER — Emergency Department: Admit: 2024-03-12 | Payer: MEDICAID

## 2024-03-12 ENCOUNTER — Inpatient Hospital Stay
Admit: 2024-03-12 | Discharge: 2024-03-12 | Disposition: A | Discharge status: Home / Self Care | Payer: MEDICAID | Arrived: AM | Attending: Emergency Medicine

## 2024-03-12 DIAGNOSIS — R079 Chest pain, unspecified: Secondary | ICD-10-CM

## 2024-03-12 LAB — CBC WITH DIFFERENTIAL
Basophils %: 0.4 %
Basophils Absolute: 0.02 K/uL (ref 0.00–0.22)
Eosinophils %: 2.4 %
Eosinophils Absolute: 0.11 K/uL (ref 0.00–0.50)
Hematocrit: 41.8 % (ref 32.0–53.0)
Hemoglobin: 14 g/dL (ref 13.0–17.5)
Immature Granulocytes %: 0.2 %
Immature Granulocytes Absolute: 0.01 K/uL (ref 0.00–0.10)
Lymphocyte %: 36.6 %
Lymphocytes Absolute: 1.66 K/uL (ref 0.70–4.00)
MCH: 30.2 pg (ref 26.0–34.0)
MCHC: 33.5 g/dL (ref 31.0–37.0)
MCV: 90.1 fL (ref 80.0–100.0)
MPV: 10.7 fL (ref 9.1–12.4)
Monocytes %: 6.4 %
Monocytes Absolute: 0.29 K/uL — ABNORMAL LOW (ref 0.36–0.83)
NRBC %: 0 % (ref 0.0–0.0)
NRBC Absolute: 0 K/uL (ref 0.00–2.00)
Neutrophil %: 54 %
Neutrophils Absolute: 2.44 K/uL (ref 1.50–7.95)
Platelets: 233 K/uL (ref 150–400)
RBC: 4.64 M/uL (ref 4.20–5.90)
RDW-CV: 12.4 % (ref 11.5–14.5)
RDW-SD: 40.5 fL (ref 35.0–51.0)
WBC: 4.5 K/uL (ref 4.0–11.0)

## 2024-03-12 LAB — COMPREHENSIVE METABOLIC PANEL
ALT: 103 U/L — ABNORMAL HIGH (ref 0–55)
AST: 60 U/L — ABNORMAL HIGH (ref 6–42)
Albumin: 4.4 g/dL (ref 3.2–5.0)
Alkaline phosphatase: 68 U/L (ref 30–130)
Anion Gap: 12 mmol/L (ref 3–14)
BUN: 16 mg/dL (ref 6–24)
Bilirubin, total: 0.3 mg/dL (ref 0.2–1.2)
CO2 (Bicarbonate): 25 mmol/L (ref 20–32)
Calcium: 10 mg/dL (ref 8.5–10.5)
Chloride: 99 mmol/L (ref 98–110)
Creatinine: 0.87 mg/dL (ref 0.55–1.30)
Glucose: 404 mg/dL — ABNORMAL HIGH (ref 70–139)
Potassium: 3.8 mmol/L (ref 3.6–5.2)
Protein, total: 8.1 g/dL (ref 6.0–8.4)
Sodium: 136 mmol/L (ref 135–146)
eGFRcr: 108 mL/min/1.73m*2 (ref >=60)

## 2024-03-12 LAB — LIGHT BLUE TOP

## 2024-03-12 LAB — NT PRO BNP: NT-proBNP: 44 pg/mL (ref 0–125)

## 2024-03-12 LAB — TROPONIN T, HIGH SENSITIVITY
Troponin T, High Sensitivity: 12 ng/L — ABNORMAL HIGH (ref <12)
Troponin T, High Sensitivity: 13 ng/L — ABNORMAL HIGH (ref <12)

## 2024-03-12 LAB — BLOOD BANK HOLD TUBE

## 2024-03-12 MED ORDER — LACTATED RINGERS IV BOLUS
Freq: Once | INTRAVENOUS | Status: AC
Start: 2024-03-12 — End: 2024-03-12
  Administered 2024-03-12: 09:00:00 1000 mL via INTRAVENOUS

## 2024-03-12 MED ORDER — MORPHINE 4 MG/ML INTRAVENOUS SOLUTION WRAPPER
4 | Freq: Once | INTRAVENOUS | Status: AC
Start: 2024-03-12 — End: 2024-03-12
  Administered 2024-03-12: 09:00:00 4 mg via INTRAVENOUS

## 2024-03-12 MED ORDER — ONDANSETRON HCL (PF) 4 MG/2 ML INJECTION SOLUTION
4 | Freq: Once | INTRAMUSCULAR | Status: AC
Start: 2024-03-12 — End: 2024-03-12
  Administered 2024-03-12: 09:00:00 4 mg via INTRAVENOUS

## 2024-03-12 MED FILL — MORPHINE 4 MG/ML INTRAVENOUS SOLUTION WRAPPER: 4 4 mg/mL | INTRAVENOUS | Qty: 1 | Fill #0

## 2024-03-12 MED FILL — ONDANSETRON HCL (PF) 4 MG/2 ML INJECTION SOLUTION: 4 4 mg/2 mL | INTRAMUSCULAR | Qty: 2 | Fill #0

## 2024-03-12 NOTE — ED Triage Notes (Addendum)
 [place]. Pt reported he was developed chest pain. Pt reports hx of MI. Pt was given asa by ems. Pt has allergy to nitro.

## 2024-03-12 NOTE — Unmapped (Signed)
 Patient: [name]   MRN: [id number]   Date of service: 03/12/2024   PCP: No PCP, Per Patient     EMERGENCY DEPARTMENT ENCOUNTER    CHIEF COMPLAINT       Chief Complaint   Patient presents with    Chest Pain       HPI     [name] is a 48 y.o. adult with a PMH of Medical History[1] who presents to the ED for evaluation of chest pain with radiculopathy down his left arm to the jaw.  Patient reports symptoms starting about 15 to 20 minutes prior to arrival.  He was sitting outside waiting for family member.  He denies any drugs or alcohol .  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  Aspirin  given by EMS however no nitroglycerin given patient's allergy.SABRA          PAST MEDICAL HISTORY       Medical History[2]    CURRENT MEDICATIONS       Discharge Medication List as of 03/12/2024  7:04 AM        CONTINUE these medications which have NOT CHANGED    Details   aspirin  81 mg capsule Take 81 mg by mouth in the morning., Starting Sun 01/05/2018, Historical Med      atorvastatin  (Lipitor ) 10 mg tablet Take 80 mg by mouth in the morning., Starting Sun 01/05/2018, Historical Med      insulin  glargine (Lantus ) 100 unit/mL (3 mL) injection Inject 15 Units under the skin at bedtime., Starting Thu 12/07/2021, Historical Med      lidocaine  (Lidoderm ) 5 % patch Apply 1 patch topically once daily. Remove & discard patch within 12 hours or as directed by MD., Starting Tue 10/22/2023, Normal      metoprolol  succinate XL (Toprol -XL) 50 mg 24 hr tablet Take 25 mg by mouth twice daily., Starting Thu 12/07/2021, Historical Med               FAMILY HISTORY       Family History[3]    SURGICAL HISTORY       Surgical History[4]    ALLERGIES       Allergies[5]    SOCIAL HISTORY        Social History     Socioeconomic History    Marital status: Single     Spouse name: Not on file    Number of children: Not on file    Years of education: Not on file    Highest education level: Not on file   Occupational History    Not on file   Tobacco Use     Smoking status: Every Day     Current packs/day: 0.50     Types: Cigarettes     Passive exposure: Current    Smokeless tobacco: Never   Vaping Use    Vaping status: Some Days    Substances: Nicotine   Substance and Sexual Activity    Alcohol  use: Never    Drug use: Never    Sexual activity: Defer   Other Topics Concern    Not on file   Social History Narrative    Not on file     Social Determinants of Health     Financial Resource Strain: Low Risk  (03/04/2024)    Received from [facility]    Overall Financial Resource Strain (CARDIA)     Difficulty of Paying Living Expenses: Not hard at all   Food Insecurity: Unknown (  03/04/2024)    Received from [facility]    Hunger Vital Sign     Within the past 12 months, you worried that your food would run out before you got the money to buy more.: Never true     Ran Out of Food in the Last Year: Not on file   Transportation Needs: No Transportation Needs (03/04/2024)    Received from [facility] - Transportation     Lack of Transportation (Medical): No     Lack of Transportation (Non-Medical): No   Physical Activity: Not on file   Stress: No Stress Concern Present (03/04/2024)    Received from [facility] - Occupational Stress Questionnaire     Feeling of Stress : Not at all   Social Connections: Unknown (07/30/2019)    Received from [facility]    Social Connections     11. How often do you see or talk to people that you care about and feel close to? (Talking to friends on the phone or virtually, visiting friends/family, religious service, clubs): Not on file   Intimate Partner Violence: Unknown (03/04/2024)    Received from [facility] [place]    Humiliation, Afraid, Rape, and Kick questionnaire     Within the last year, have you been afraid of your partner or ex-partner?: No     Emotionally Abused: Not on file     Physically Abused: Not on file     Sexually  Abused: Not on file   Housing Stability: Unknown (03/04/2024)    Received from [facility] Stability Vital Sign     Unable to Pay for Housing in the Last Year: Not on file     Number of Times Moved in the Last Year: Not on file     At any time in the past 12 months, were you homeless or living in a shelter (including now)?: No       PHYSICAL EXAM        VITAL SIGNS:    ED Triage Vitals   Temp Pulse Resp BP   03/12/24 0428 03/12/24 0428 03/12/24 0428 03/12/24 0428   36.6 C (97.8 F) 88 16 (!) 161/93      SpO2 Temp src Heart Rate Source Patient Position   03/12/24 0428 -- 03/12/24 0600 --   97 %  Monitor       BP Location FiO2 (%)     -- --                General: The patient appears awake, alert, non-toxic, and in no acute distress.  Head: Normocephalic, atraumatic.   Eyes: PERRL.  ENT: Moist mucus membranes.  Neck: Supple, trachea midline.  Respiratory: CTAB, non-labored breathing, symmetrical chest expansion, no respiratory distress.  CVS: Regular rate and rhythm  GI: Soft, non-tender. Normoactive bowel sounds in all quadrants. No distention, masses or guarding.   Musculoskeletal: No deformities full range of motion  Neuro: GCS15, AOx4, cranial nerves II-XII grossly intact, mentation is appropriate for age, moving all 4 extremities, steady gait.  Psych: Behavior is cooperative and pleasant. Affect is calm.  Skin: Warm, dry and intact. Normal capillary refill.       RESULTS        LABS  Labs Reviewed   COMPREHENSIVE METABOLIC PANEL - Abnormal       Result Value    Sodium 136  Potassium 3.8      Chloride 99      CO2 (Bicarbonate) 25      Anion Gap 12      BUN 16      Creatinine 0.87      eGFRcr 108      Glucose 404 (*)     Fasting? Unknown      Calcium 10.0      AST 60 (*)     ALT 103 (*)     Alkaline phosphatase 68      Protein, total 8.1      Albumin 4.4      Bilirubin, total 0.3     TROPONIN T, HIGH SENSITIVITY - Abnormal    Troponin T, High Sensitivity 12 (*)    CBC WITH DIFFERENTIAL -  Abnormal    WBC 4.5      RBC 4.64      Hemoglobin 14.0      Hematocrit 41.8      MCV 90.1      MCH 30.2      MCHC 33.5      RDW-CV 12.4      RDW-SD 40.5      Platelets 233      MPV 10.7      Neutrophil % 54.0      Lymphocyte % 36.6      Monocytes % 6.4      Eosinophils % 2.4      Basophils % 0.4      Immature Granulocytes % 0.2      NRBC % 0.0      Neutrophils Absolute 2.44      Lymphocytes Absolute 1.66      Monocytes Absolute 0.29 (*)     Eosinophils Absolute 0.11      Basophils Absolute 0.02      Immature Granulocytes Absolute 0.01      NRBC Absolute 0.00     TROPONIN T, HIGH SENSITIVITY - Abnormal    Troponin T, High Sensitivity 13 (*)    NT PRO BNP - Normal    NT-proBNP 44      Narrative:     The 2021 Universal Definition and Classification of Heart Failure states that a diagnosis of heart failure (HF) requires symptoms and/or signs of HF caused by structural and/or functional cardiac abnormalities, accompanied by objective evidence of congestion and/or:     Test             Ambulatory Patients    Hospitalized/Decompensated   NT-proBNP (pg/mL)   >=125                  >=300         Patients with heart failure with preserved ejection fraction (HFpEF) have lower levels of the natriuretic peptides than patients with a similar severity of heart failure with reduced ejection fraction (HFrEF).     In dyspneic patients in emergency department settings,2 NT-proBNP cut-offs for diagnosis of acute HF were 450, 900, and 1,800 pg/ml for ages <50, 50 to 6, and >75 years.     (1) Bozkurt B et al.  J Cardiac Fail 2021;27:387-413   (2) Melvenia RUSH et al. J Am Coll Cardiol 2018;71:1191-1200.    CBC W/DIFF    Narrative:     The following orders were created for panel order CBC and differential.  Procedure  Abnormality         Status                     ---------                               -----------         ------                     CBC w/ Differential[[id number]]          Abnormal             Final result                 Please view results for these tests on the individual orders.       RADIOLOGY  XR CHEST 2 VIEWS   Final Result       Cardiomegaly.         DICTATED: Emery Mould, MD   IN ACCORDANCE WITH DEPARTMENT POLICY, TEACHING PHYSICIANS REVIEW ALL IMAGES, AND EDIT REPORTS AS REQUIRED. A REPORT IS NOT FINAL UNTIL APPROVED BY A STAFF RADIOLOGIST.      APPROVED BY STAFF RADIOLOGIST: Lamar Makua, MD 03/12/2024 6:15 AM EDT                  ED COURSE & MEDICAL DECISION MAKING        Pertinent Labs & Imaging studies reviewed. (See chart for details)    Diagnoses as of 03/12/24 1939   Chest pain, unspecified type       MDM  48 year old male presents for evaluation of chest pain.  Patient was afebrile vital signs stable.  Patient reported allergy to nitroglycerin so morphine  was given for pain.  Patient labs within normal limits initial troponin of 12 repeat troponin of 13.  This is likely not acute ACS.  Reviewing patient's chart he has visited numerous emergency departments throughout [place]  with similar complaints all with negative troponins.  Given his allergy to nitroglycerin I feel that there is some secondary gain and pain med seeking behaviors.  Patient was discharged and explained to follow-up with his outpatient providers.    CLINICAL IMPRESSION  1. Chest pain, unspecified type         Final Disposition: Discharge  _______________________________________________________________________________________________    Fonda Beverley Row, PA          [1]   Past Medical History:  Diagnosis Date    Coronary artery disease     Diabetes mellitus     Heart attack     x3    Hypertension     pt denies having high BP   [2]   Past Medical History:  Diagnosis Date    Coronary artery disease     Diabetes mellitus     Heart attack     x3    Hypertension     pt denies having high BP   [3]   Family History  Problem Relation Name Age of Onset    Heart disease Father     [4]   Past Surgical  History:  Procedure Laterality Date    ARTERIAL BYPASS SURGERY      x2    CHOLECYSTECTOMY      CORONARY ARTERY BYPASS GRAFT     [5]   Allergies  Allergen Reactions    Ibuprofen Anaphylaxis, Hives, Itching and Rash     Other  reaction(s): Anaphylactic reaction, Unknown, Urticaria  anaphylaxis  anaphylaxis      Ketorolac Anaphylaxis, Hives and Rash     Other reaction(s): ITCHY HIVES, Unknown, Urticaria  Converted from Generic Allergy: Ketorolac  Anaphylaxis   Anaphylaxis   Tolerates ASA      Nitroglycerin      hives    Tramadol Anaphylaxis, Angioedema, Hives and Rash     Other reaction(s): THROAT CLOSES, Unknown, Urticaria  Patient confirmed that he can take Percocet , Hydromorphone  and Morphine . Vernell Bohr, Pharmacy  anaphylaxis  anaphylaxis      Codeine         Fonda Beverley Row, [place]  03/12/24 1941

## 2024-03-12 NOTE — Discharge Instructions (Addendum)
-   Please follow up with your primary care doctor within 3 days. Please tell them that you were seen in the Emergency Department Today.  - You can take over-the-counter acetaminophen  (for example: Tylenol ) as needed for pain or fever. You can take up to 1000 mg every 4-6 hours for your symptoms, but do not exceed 4000 mg in a day. Make sure to follow all additional bottle label instructions and do not exceed the maximum daily dose.

## 2024-03-12 NOTE — ED Notes (Signed)
 Discharge instructions reviewed w/ pt, all questions and concerns addressed at this time, verbalized understanding.       Burnard Moles, RN  03/12/24 [phone]

## 2024-03-12 NOTE — ED Provider Notes (Signed)
 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  EMS ECG notable for LVH with repolarization abnormality.  EMS administered aspirin  given nitroglycerin allergy.  [place].      History provided by:  Patient and EMS personnel      Patient History   Medical History[1]  Surgical History[2]  Family History[3]  Social History     Tobacco Use    Smoking status: Every Day     Current packs/day: 0.50     Types: Cigarettes     Passive exposure: Current    Smokeless tobacco: Never   Vaping Use    Vaping status: Some Days    Substances: Nicotine   Substance Use Topics    Alcohol  use: Never    Drug use: Never       Review of Systems   Review of Systems   Constitutional:         As noted in HPI       Physical Exam   ED Triage Vitals [03/12/24 0428]   Temp Pulse Resp BP   36.6 C (97.8 F) 88 16 (!) 161/93      SpO2 Temp src Heart Rate Source Patient Position   97 % -- -- --      BP Location FiO2 (%)     -- --       Physical Exam  Vitals and nursing note reviewed.   Constitutional:       General: Tan is not in acute distress.     Appearance: Ashlin is well-developed.   HENT:      Head: Normocephalic and atraumatic.   Eyes:      Conjunctiva/sclera: Conjunctivae normal.   Cardiovascular:      Rate and Rhythm: Normal rate and regular rhythm.      Heart sounds: No murmur heard.  Pulmonary:      Effort: Pulmonary effort is normal. No respiratory distress.      Breath sounds: Normal breath sounds.   Abdominal:      Palpations: Abdomen is soft.      Tenderness: There is no abdominal tenderness.   Musculoskeletal:         General: No swelling.       Cervical back: Neck supple.      Right lower leg: No edema.      Left lower leg: No edema.   Skin:     General: Skin is warm and dry.      Capillary Refill: Capillary refill takes less than 2 seconds.   Neurological:      General: No focal deficit present.      Mental Status: Farouk is alert and oriented to person, place, and time.   Psychiatric:         Mood and Affect: Mood normal.       No orders to display       Labs Reviewed   CBC WITH DIFFERENTIAL - Abnormal       Result Value    WBC 4.5  RBC 4.64      Hemoglobin 14.0      Hematocrit 41.8      MCV 90.1      MCH 30.2      MCHC 33.5      RDW-CV 12.4      RDW-SD 40.5      Platelets 233      MPV 10.7      Neutrophil % 54.0      Lymphocyte % 36.6      Monocytes % 6.4      Eosinophils % 2.4      Basophils % 0.4      Immature Granulocytes % 0.2      NRBC % 0.0      Neutrophils Absolute 2.44      Lymphocytes Absolute 1.66      Monocytes Absolute 0.29 (*)     Eosinophils Absolute 0.11      Basophils Absolute 0.02      Immature Granulocytes Absolute 0.01      NRBC Absolute 0.00     CBC W/DIFF    Narrative:     The following orders were created for panel order CBC and differential.  Procedure                               Abnormality         Status                     ---------                               -----------         ------                     CBC w/ Differential[[id number]]          Abnormal            Final result                 Please view results for these tests on the individual orders.   COMPREHENSIVE METABOLIC PANEL   TROPONIN T, HIGH SENSITIVITY   NT PRO BNP       Glasgow Coma Scale Score: 15      ED Course & MDM        Medical Decision Making  Patient presents with chest pain.  Review of records in epic show this is a frequent presentation.  Patient has multiple negative troponins over the past few weeks.  Patient also has an echocardiogram from last week as well as a stress test in April 2025.  ECG does not demonstrate any acute ischemic changes.   Reviewed ECG from EMS that demonstrated LVH with repolarization abnormality no significant changes on hospital ECG.  Patient is PERC negative making PE unlikely.  Troponins negative.  Patient received aspirin  for pain.    Amount and/or Complexity of Data Reviewed  Independent Historian: EMS  External Data Reviewed: notes.     Details: EDIE noting 100 ED visits in the past calendar year at 31 different facilities across multiple states.  Last stress test was April 2025, last echo 03/05/2024 at [facility] with notable for mildly increased wall thickness of the left ventricle, otherwise with mild to moderate aortic regurgitation.   Labs: ordered.  Radiology: ordered and independent interpretation performed.  Details: X-ray independently visualized and interpreted by me demonstrates no acute process  ECG/medicine tests: ordered and independent interpretation performed.     Details: ECG independently visualized and interpreted by me demonstrates sinus with ventricular rate of 83, LVH by voltage with repolarization abnormality, no STEMI, no significant change compared to EMS ECG    Risk  Prescription drug management.        Patient was screened for Social Drivers of Health during this encounter and found to have concerns in the following domains:   transportation insecurity    The After Visit Summary provided to the patient includes information on how to find resources related to these needs.         Encounter Date: 03/12/24   ECG 12 lead    Narrative                                 [facility]                                                                                  Test Date:    2024-03-12  Bruna Name:     Nayshawn Jablon          Department:   N1-ED  Patient ID:   [id number]                 Room:         A08  Gender:       Male                     Technician:   [facility]  DOB:          01-Jul-1975               Requested By: FONDA ROW  Order Number: [id number]                Reading MD:                                       Measurements  Intervals                              Axis            Rate:         83                       P:            47  PR:           175                      QRS:          20  QRSD:         103                      T:  105  QT:           355                                      QTc:          418                                                                 Interpretive Statements  Sinus rhythm  Left atrial enlargement  LVH with secondary repolarization abnormality  Anterior ST elevation, probably due to LVH                    [1]   Past Medical History:  Diagnosis Date    Coronary artery disease     Diabetes mellitus     Heart attack     x3    Hypertension     pt denies having high BP   [2]   Past Surgical History:  Procedure Laterality Date    ARTERIAL BYPASS SURGERY      x2    CHOLECYSTECTOMY      CORONARY ARTERY BYPASS GRAFT     [3]   Family History  Problem Relation Name Age of Onset    Heart disease Father          Janell CHRISTELLA Archer, MD  03/12/24 [phone]

## 2024-03-18 NOTE — ED Provider Notes (Signed)
 History     Chief Complaint   Patient presents with   . Chest Pain     Patient is a 48 y.o. male.    The history is provided by the patient and medical records.   Chest Pain  Pain location:  L chest  Pain radiates to:  L jaw and L arm  Onset quality:  Sudden  Progression:  Unchanged  Chronicity:  Chronic  Context: at rest    Previous treatments:  Aspirin   Worsened by:  Nothing  Associated symptoms: diaphoresis and nausea    Associated symptoms: no abdominal pain, no altered mental status, no anorexia, no anxiety, no claudication, no cough, no dizziness, no dysphagia, no fatigue, no fever, no headache, no heartburn, no lower extremity edema, no near-syncope, no numbness, no orthopnea, no palpitations, no PND, no shortness of breath, no syncope, not vomiting and no weakness    Risk factors: coronary artery disease, diabetes mellitus, high cholesterol, hypertension and male sex          Review of Systems   Constitutional:  Positive for diaphoresis. Negative for fatigue and fever.   HENT: Negative.  Negative for trouble swallowing.    Respiratory: Negative.  Negative for cough and shortness of breath.    Cardiovascular:  Positive for chest pain. Negative for palpitations, orthopnea, claudication, syncope, PND and near-syncope.   Gastrointestinal:  Positive for nausea. Negative for abdominal pain, anorexia, heartburn and vomiting.   Genitourinary: Negative.    Musculoskeletal: Negative.    Skin: Negative.    Neurological: Negative.  Negative for dizziness, weakness, numbness and headaches.       Physical Exam   BP 151/76 (BP Cuff Location: Left Upper Arm)   Pulse 80   Temp 97.8 F (36.6 C) (Oral)   Resp 18   Ht 1.905 m   Wt 113.4 kg   SpO2 100%   BMI 31.25 kg/m       Physical Exam Constitutional:       Appearance: Normal appearance.   Cardiovascular:      Rate and Rhythm: Normal rate and regular rhythm.   Pulmonary:      Effort: Pulmonary effort is normal.      Breath sounds: Normal breath sounds.   Abdominal:       Palpations: Abdomen is soft.      Tenderness: There is no abdominal tenderness.   Musculoskeletal:         General: No swelling.      Right lower leg: No edema.      Left lower leg: No edema.   Neurological:      Mental Status: He is alert.   Skin:     General: Skin is warm and dry.     Vitals reviewed.      Medical Decision Making             Assessment and Plan  Both internal & external hospital, office &/or facility(ie: SNF, NH, SAR, LTAC, ALF, GH, correctional institution, etc) records reviewed as applicable.     Patient is a 48yo male w/ pmh of htn, hlp, DM & CAD s/p CABG here w/ chest pain. Describes sudden onset Lsided chest pain radiating to LUE & L jaw accompanied by diaphoresis pta [place] on the train. Chart review shows >12 ER visits this month alone for similar, all at different hospitals. Immediately requesting opiates because that's the only thing that works. LHC in June & TTE 2wks ago, both reassuring.  ROS as above.     Exam:   VSS.  Const: NAD. Well nourished, well developed, appears stated age.  HEENT: NCAT. PERRL, no conjunctival injection. EOMI. Neck supple with FROM. Moist mucous membranes.   CV: RRR. Warm, well-perfused extremities.  RESP: CTAB. Unlabored respiratory effort.  GI: Abdomen soft, non-tender, non-distended, no rebound, no guarding, no rigidity.  MSK: No gross deformities appreciated.  Skin: Warm, dry. No rashes.  Neuro: Alert, CNs II-XII grossly intact. Sensation and motor function of extremities grossly intact.  Psych: Appropriate mood and affect.     Plan for labs, imaging & symptom control.     Pertinent Labs:  -cbc: wnl  -cmp: glucose 422  -1st troponin: 3  -2nd troponin:  -3rd troponin:    Imaging:  -EKG  -CXR     Consults:  -    Interventions:  -full dose ASA pta  -Zofran   -NTG *pt reports allergy  -Morphine  *pt reports allergy  -Toradol *pt reports allergy  -Tylenol  *pt declining    Updates:  -Patient requesting to leave AMA. The patient is A&Ox4 & in my  opinion has the capacity/competency to make decisions regarding his medical care. I fully explained to him the risks of leaving the hospital prior to completion of work-up including but not limited to heart attack & even death. He was provided the opportunity to ask questions in regards to his condition. The patient was informed that he may return for care at any time. He expressed understanding.     Impression: Chest pain w/ HEART score of at least 5.     Plan: AMA.     Please note that patient's treatment may have been impacted by SDOH(ie: housing insecurity, literacy, access to medical care, etc).          Final diagnoses:   None       ED Course as of 03/18/24 0234   Wed Mar 18, 2024   0225 High Sensitivity Troponin I: 3 [MP]   0226 Trop low. Hyperglycemic w/o AG. Chart review from 03/07/2024 suggests patient takes 30U lantus . Reports compliance with his medications. Slight transaminitis but nontender abd, do not suspect acute chole or other intraabdominal pathology. No further wu needed for this inpatient. Will discuss hyperglycemia. [MP]   0232 Discussed hyperglycemia. Pt stated that endo just tells him sometimes it is high. Offered to treat the hypergylcemia now as it is dangerous for it to stay this elevated. Patient is aaox4 to person/place/time/event w/ decision making capacity. Understands risks of leaving before chest pain wu is complete, including but not limited to permanent cardiac damage, permanent disability and death, as well as risk of uncontrolled hypergylcemia leading to DKA or HHS which can lead to seizures, heart related complications,  [MP]      ED Course User Index  [MP] Milan Lenton Blanch, MD

## 2024-03-22 ENCOUNTER — Emergency Department: Admit: 2024-03-23 | Payer: MEDICAID

## 2024-03-22 DIAGNOSIS — R0789 Other chest pain: Secondary | ICD-10-CM

## 2024-03-22 DIAGNOSIS — R079 Chest pain, unspecified: Principal | ICD-10-CM

## 2024-03-22 NOTE — ED Provider Notes (Signed)
 Formatting of this note is different from the original.    Chief Complaint   Patient presents with    Chest Pain     HPI     [name] is a 48 y.o. male presenting to the Emergency Department here at [facility] with chief complaint of chest pain.   The history is given by patient and reviewing his records.   Patient was admitted to this hospital June 25 through November 16, 2023 history of CAD, previous bypass apparently one-vessel he had presented at that time with 10 out of 10 chest pain with been noted that he had presented to the emergency department 10 times for chest pain.  He is also seen by my colleague 02/23/2024.  Card catheterization was clean coronaries November 15, 2023 his urine drug screen did show fentanyl .  He has had multiple visits since that cardiac catheterization documented by my colleague on February 23, 2024 November 17, 2023 chest pain with reassuring workup [facility] November 17, 2023 again unremarkable [facility] December 14, 2023 chest pain and eloped.  This pain started with chest pain 50 minutes before he came in he says his chest goes to his jaw and left shoulder not ripping not tearing.  I explained to him that I need to evaluate him and ask him some questions before lab work was done and any pain medicine given but he elected to leave AGAINST MEDICAL ADVICE.  He says he is allergic to ibuprofen and is allergic to Toradol he is allergic to tramadol.  I had offered Toradol.  Cardiac risk factors.  Positive for former smoking negative for hypertension possible dyslipidemia diabetes no history of PE or DVT recent surgery recent travel or cancer.  Says he had a cough for about a day nothing coming up no fever.     Patient History     Past Medical History:   Diagnosis Date    Coronary artery disease     Hyperlipidemia     Hypertension     Type 2 diabetes mellitus (CMS/HCC)     Now diet controlled     Surgical History[1]     Family History   Problem Relation Name Age of Onset    Heart  disease Mother      Heart disease Father       Social History[2]    Review of Systems    All systems were reviewed and were negative except   as noted in the above history of present illness and ROS.    ED Triage Vitals [03/22/24 1648]   Temp Pulse Resp BP SpO2   98.2 F (36.8 C) (!) 98 20 147/87 96 %     Temp src Heart Rate Source Patient Position BP Location FiO2 (%) (Set)   Oral -- Sitting Left arm --     Social history denies smoking now no alcohol  or drugs    Physical Exam    General: Looks comfortable  HEENT:  Atraumatic, EOMI, mouth and throat moist.  PERRL   Neck:  Supple, no masses.  Lungs: Slight expiratory wheeze  Cardiac:  RRR, no rubs.  No murmurs or rubs  Abdomen:  Soft, non-tender, normal bowel sounds, no hepatosplenomegaly.  Extremities:  No tenderness.  No calf tenderness or calf swelling  Skin:  No bruises or rashes.  Neuro:  Cranial nerves 2-12 intact, motor strength 5/5 upper and lower extremities, sensation:  normal to touch.    Psychiatric: Not agitated.  ECG    (Results Pending)   CXR chest pain    (Results Pending)     MDM    ED COURSE & MEDICAL DECISION MAKING     This is a 48 year old male who has frequent visits for chest pain that are often reassuring previous bypass 1 vessel he said previous MI he states.  Cardiac catheterization was clean coronaries back in June 2025 he has many ED visits that were all reassuring many hospitals.  In the emergency department he was asking for pain medication I offered him Toradol he says he allergic to Toradol tramadol and nonsteroidal.  But that he would have to be checked regarding laboratory data and chest x-ray for further therapy.  He looks comfortable.  He had clean coronaries at his last catheterization just in June.  Low suspicion for acute coronary syndrome.  With normal EKG.  Unchanged.  He signed out AGAINST MEDICAL ADVICE because he did not get pain medication that he wanted.  I explained to him that he could come back anytime that we  would do laboratory data to evaluate him for this.  Also offered inhaler because he was slightly wheezing says he has 1 at home.  He appears to be in sound mind and able to make this decision on his own understanding that this may result in disability and death.  Independent Interpretation of Studies:   EKG read by me demonstrates sinus rhythm rate of 91 QTc 0.430 no ischemic changes flat T wave in lead I bifid T wave in aVL compared to EKG from February 23, 2024 this is similar    Differential includes angina, unstable angina, acute MI, bronchitis, pneumonia, musculoskeletal pain, pleurisy    ED and nursing records reviewed.  Additional pertinent available records reviewed: Reviewed the triage note  Reviewed his last admission when he had a heart catheterization and my colleagues visit in October    Discussion of management with physician, qualified health professional, and/or appropriate source: None indicated    Escalation of care to admission or observation considered none indicated    Consideration of treatments or testing, not performed: Ordered laboratory data chest x-ray    Chronic Conditions affecting care include frequent chest pain with negative cardiac catheterization    Social Determinants of Health significantly affecting the care and disposition of this patient include as above.          Final diagnoses:   [R07.9] Chest pain, unspecified type     Impression:  As above signed out AGAINST MEDICAL ADVICE    Plan  Signed out AGAINST MEDICAL ADVICE EKG unchanged.  Vital signs unremarkable.    VOICE DICTATION:  This document was primarily generated by voice recognition software.  There may be incorrect spelling or phrases that were missed and/or unedited.  Unintentional errors may thus be present, resulting in a change of meaning or intent.  Please contact me directly if you have any questions regarding this dictation.       [1]   Past Surgical History:  Procedure Laterality Date    CORONARY ARTERY BYPASS  GRAFT      x2    PR CATH PLACEMENT & NJX CORONARY ART ANGIO IMG S&I N/A 11/15/2023    Procedure: Coronary Angiography;  Surgeon: Denmark, David Martin, MD;  Location: [facility] CARDIAC CATH LABS;  Service: Cardiovascular    PR CATH PLMT & NJX CORONARY ART/GRFT ANGIO IMG S&I N/A 11/15/2023    Procedure: Bypass Graft Study;  Surgeon: Denmark, David Martin,  MD;  Location: [facility] CARDIAC CATH LABS;  Service: Cardiovascular   [2]   Social History  Tobacco Use    Smoking status: Every Day     Current packs/day: 0.25     Types: Cigarettes    Smokeless tobacco: Never   Vaping Use    Vaping status: Every Day   Substance Use Topics    Alcohol  use: Not Currently       Lesleigh Lemond Barter, MD  03/22/24 1933    Electronically signed by Lesleigh Lemond Barter, MD at 03/22/2024  7:33 PM EST

## 2024-03-22 NOTE — ED Provider in Triage Note (Signed)
 Provider in Triage Evaluation:     Focused HPI   No chief complaint on file.    48yo male w/ pmh of htn, hlp, DM & CAD s/p CABG here w/ chest pain presents for evaluation of chest pain going into the last 15 minutes.                  No data recorded                                Patient History   Medical History[1]  Surgical History[2]  Family History[3]  Social History     Tobacco Use    Smoking status: Every Day     Current packs/day: 0.50     Types: Cigarettes     Passive exposure: Current    Smokeless tobacco: Never   Vaping Use    Vaping status: Some Days    Substances: Nicotine   Substance Use Topics    Alcohol  use: Never    Drug use: Never       Focused Review of Systems   Review of Systems   Constitutional:  Negative for appetite change, fatigue and unexpected weight change.   HENT:  Negative for congestion.    Respiratory:  Positive for chest tightness and shortness of breath.    Cardiovascular:  Positive for chest pain.   Gastrointestinal:  Negative for nausea and vomiting.   Neurological:  Negative for dizziness and headaches.   Psychiatric/Behavioral:  Negative for agitation and confusion.        Focused Physical Exam   ED Triage Vitals   Temp Pulse Resp BP   -- -- -- --      SpO2 Temp src Heart Rate Source Patient Position   -- -- -- --      BP Location FiO2 (%)     -- --       Physical Exam  Vitals and nursing note reviewed.   Constitutional:       General: [name] is not in acute distress.     Appearance: [name] is normal weight. [name] is not toxic-appearing.   HENT:      Head: Normocephalic and atraumatic.   Cardiovascular:      Rate and Rhythm: Normal rate and regular rhythm.   Pulmonary:      Effort: Pulmonary effort is normal.   Musculoskeletal:         General: Normal range of motion.   Skin:     General: Skin is warm.      Capillary Refill: Capillary refill takes less than 2 seconds.   Neurological:      General: No focal deficit present.      Mental Status: [name] is alert and oriented to  person, place, and time.   Psychiatric:         Mood and Affect: Mood normal.       No orders to display       Orders Placed This Encounter   Procedures    XR CHEST 2 VIEWS    Comprehensive metabolic panel    CBC and differential    Troponin T, High Sensitivity    Troponin T, High Sensitivity (T+1)    Magnesium    CBC w/ Differential    Glucose, POCT    ECG 12 lead       Labs Reviewed - No data to display    Triage MDM/Plan  Dispo per ED attending  Alm Moor, PA    Medical screening evaluation was performed in triage and workup was initiated. Patient was advised further evaluation and treatment may be required and advised not to leave the Emergency Department until completed.         [1]   Past Medical History:  Diagnosis Date    Coronary artery disease     Diabetes mellitus     Heart attack     x3    Hypertension     pt denies having high BP   [2]   Past Surgical History:  Procedure Laterality Date    ARTERIAL BYPASS SURGERY      x2    CHOLECYSTECTOMY      CORONARY ARTERY BYPASS GRAFT     [3]   Family History  Problem Relation Name Age of Onset    Heart disease Father          Alm Moor, [place]  03/22/24 2154

## 2024-03-22 NOTE — ED Provider Notes (Signed)
 EMERGENCY DEPARTMENT ENCOUNTER    ATTENDING NOTE  Date of service: 03/22/2024  9:59 PM    HISTORY OF PRESENT ILLNESS:    [name] is a 48 y.o. adult with history of coronary disease status post CABG, diabetes, hypertension, hyperlipidemia and numerous recent ED presentations to the emergency room for acute chest pain.  Patient states that he developed acute sharp chest pain in his left chest rating to his left arm and jaw 15 minutes prior to arrival.  He states that he can only tolerate narcotics for pain medication.    Review of the medical record is notable for a recent echo on October 16 with a normal ejection fraction and a stress test on April 30 of this year with no ischemic changes.      PHYSICAL EXAM   Vitals and nursing note reviewed.     Well-appearing 48 year old male in no apparent distress.  Normocephalic atraumatic.  Anicteric sclera.  Pupils are equal reactive bilaterally.  Oropharynx moist mucous membranes.  Voice is normal.  Cardiac exam is regular rate and rhythm.  Lungs are clear to auscultation.  Moves all 4 extremities.  No extremity edema.  Alert and interactive.  Mood and affect are normal.        LABS  Labs Reviewed   POCT GLUCOSE - Abnormal       Result Value    POCT Glucose 358 (*)    COMPREHENSIVE METABOLIC PANEL   CBC W/DIFF    Narrative:     The following orders were created for panel order CBC and differential.  Procedure                               Abnormality         Status                     ---------                               -----------         ------                     CBC w/ Differential[[id number]]                                                           Please view results for these tests on the individual orders.   TROPONIN T, HIGH SENSITIVITY   TROPONIN T, HIGH SENSITIVITY   MAGNESIUM   CBC WITH DIFFERENTIAL   POCT GLUCOSE METER        RADIOLOGY  XR CHEST 2 VIEWS    (Results Pending)       Diagnoses as of 03/22/24 2301   Chest pain, unspecified type        MEDICAL DECISION MAKING    Patient was treated with a full aspirin  and plan for serial troponins.  EKG was unremarkable.  Patient insisting on narcotic pain medication however given his numerous visits and no ischemic changes on EKG we asked that we complete the workup.  Patient declined and left AGAINST MEDICAL ADVICE.    INDEPENDENT INTERPRETATIONS  EKG visualized and interpreted by me  shows normal sinus rhythm with early repolarization changes.  No significant change from previous EKGs.    CLINICAL IMPRESSION:    1. Chest pain, unspecified type                  Cheryll Plain, MD  03/22/24 2306

## 2024-03-22 NOTE — ED Triage Notes (Signed)
 Formatting of this note might be different from the original.  [name] is a 48 y.o. male presenting with Chest Pain    Arrives via lobby with chest pain. Per patient report has hx of prior MI and CBG. 15 minutes ago had acute onset of left sided chest pain radiating to left arm and jaw. EKG obtained in triage.  Electronically signed by Melonie Rochele SQUIBB., RN at 03/22/2024  4:50 PM EST

## 2024-03-22 NOTE — ED APP/Resident Note (Signed)
 Patient: [name]   MRN: [id number]   Date of service: 03/22/2024   PCP: No PCP, Per Patient     EMERGENCY DEPARTMENT ENCOUNTER    CHIEF COMPLAINT       Chief Complaint   Patient presents with    Chest Pain       HPI     [name] is a 48 y.o. adult with past medical history of CAD status post CABG, type II diabetes mellitus, hypertension, hyperlipidemia, multiple recent ED presentations for left-sided chest pain with recent TTE on 03/05/24 with EF 60-65% and prior stress test on 09/18/23 with no ischemic ECG changes or who presents to the ED for evaluation of chest pain. The patient reports onset of left-sided, tight and sharp chest pain with radiation down the left arm, to the left neck and upper back while watching television about 15 minutes prior to arrival. He reports that he is feeling nauseated and anxious.  He denies shortness of breath, cough, fever, lower extremity swelling, exertional symptoms.    Provider documentation from [facility] ED visit 10/29: Describes sudden onset Lsided chest pain radiating to LUE & L jaw accompanied by diaphoresis pta while on the train. Chart review shows >12 ER visits this month alone for similar, all at different hospitals. Immediately requesting opiates because that's the only thing that works. LHC in June & TTE 2wks ago, both reassuring.    REVIEW OF SYSTEMS       As per HPI.     PAST MEDICAL HISTORY       Medical History[1]    CURRENT MEDICATIONS       Discharge Medication List as of 03/22/2024 10:48 PM        CONTINUE these medications which have NOT CHANGED    Details   aspirin  81 mg capsule Take 81 mg by mouth in the morning., Starting Sun 01/05/2018, Historical Med      atorvastatin  (Lipitor ) 10 mg tablet Take 80 mg by mouth in the morning., Starting Sun 01/05/2018, Historical Med      insulin  glargine (Lantus ) 100 unit/mL (3 mL) injection Inject 15 Units under the skin at bedtime., Starting Thu 12/07/2021, Historical Med      lidocaine  (Lidoderm ) 5 %  patch Apply 1 patch topically once daily. Remove & discard patch within 12 hours or as directed by MD., Starting Tue 10/22/2023, Normal      metoprolol  succinate XL (Toprol -XL) 50 mg 24 hr tablet Take 25 mg by mouth twice daily., Starting Thu 12/07/2021, Historical Med               FAMILY HISTORY       Family History[2]    SURGICAL HISTORY       Surgical History[3]    ALLERGIES       Allergies[4]    SOCIAL HISTORY        Social History     Socioeconomic History    Marital status: Single     Spouse name: Not on file    Number of children: Not on file    Years of education: Not on file    Highest education level: Not on file   Occupational History    Not on file   Tobacco Use    Smoking status: Every Day     Current packs/day: 0.50     Types: Cigarettes     Passive exposure: Current    Smokeless tobacco: Never   Vaping Use  Vaping status: Some Days    Substances: Nicotine   Substance and Sexual Activity    Alcohol  use: Never    Drug use: Never    Sexual activity: Defer   Other Topics Concern    Not on file   Social History Narrative    Not on file     Social Determinants of Health     Financial Resource Strain: Medium Risk (03/12/2024)    Received from [facility]    Overall Financial Resource Strain (CARDIA)     How hard is it for you to pay for the very basics like food, housing, medical care, and heating?: Somewhat hard   Food Insecurity: Food Insecurity Present (03/12/2024)    Received from [facility]    Hunger Vital Sign     Within the past 12 months, you worried that your food would run out before you got the money to buy more.: Often true     Ran Out of Food in the Last Year: Not on file   Transportation Needs: Unmet Transportation Needs (03/12/2024)    Received from [facility]    PRAPARE - Transportation     In the past 12 months, has lack of transportation kept you from medical appointments or from getting medications?: Yes     In the past 12 months, has lack of  transportation kept you from meetings, work, or from getting things needed for daily living?: Yes   Physical Activity: Not on file   Stress: No Stress Concern Present (03/04/2024)    Received from [facility] - Occupational Stress Questionnaire     Feeling of Stress : Not at all   Social Connections: Unknown (07/30/2019)    Received from [facility]    Social Connections     11. How often do you see or talk to people that you care about and feel close to? (Talking to friends on the phone or virtually, visiting friends/family, religious service, clubs): Not on file   Intimate Partner Violence: Unknown (03/12/2024)    Received from [facility]    Humiliation, Afraid, Rape, and Kick questionnaire     Within the last year, have you been afraid of your partner or ex-partner?: No     Emotionally Abused: Not on file     Physically Abused: Not on file     Sexually Abused: Not on file   Housing Stability: Unknown (03/12/2024)    Received from [facility]    Housing Stability Vital Sign     In the last 12 months, was there a time when you were not able to pay the mortgage or rent on time?: No     Number of Times Moved in the Last Year: Not on file     At any time in the past 12 months, were you homeless or living in a shelter (including now)?: No       PHYSICAL EXAM        VITAL SIGNS:    ED Triage Vitals [03/22/24 2152]   Temp Pulse Resp BP   35.8 C (96.5 F) 98 28 (!) 148/96      SpO2 Temp Source Heart Rate Source Patient Position   96 % Temporal Monitor Sitting      BP Location FiO2 (%)     Left arm --  General: The patient appears awake, alert, non-toxic, and in no acute distress.  Head: Normocephalic, atraumatic.   Eyes: Clear conjunctivae.   ENT: Moist mucus membranes.   Neck: Supple, trachea midline.   Respiratory: Even, unlabored respirations. Lungs CTAB. No chest wall tenderness.   CVS: Regular rate, Regular rhythm, +S1/S2,  no murmur. 2+ radial pulses. No lower extremity edema. No calf tenderness.   GI: Soft, non-tender, non-distended.   Neuro: GCS15, AOx4.  Skin: Warm, dry and intact. Normal capillary refill.     RESULTS        LABS  Labs Reviewed   POCT GLUCOSE - Abnormal       Result Value    POCT Glucose 358 (*)        RADIOLOGY  XR CHEST 2 VIEWS   Final Result   FINDINGS/IMPRESSION:       Surgical clips, median sternotomy wires, mediastinal closure device are unchanged.      No pleural effusion, pneumothorax, pulmonary edema or focal consolidation.   Stable mild cardiomegaly.      APPROVED BY STAFF RADIOLOGIST: Rosina Ruth, MD 03/22/2024 11:40 PM EST         ED COURSE & MEDICAL DECISION MAKING        Pertinent Imaging studies reviewed. Patient's medical record reviewed.     This patient is a 49 y.o. adult with PMH CAD status post CABG, type II diabetes mellitus, hypertension, hyperlipidemia, multiple recent ED presentations for left-sided chest pain with recent TTE on 03/05/24 with EF 60-65% and prior stress test on 09/18/23 with no ischemic ECG changes or who presents to the ED for evaluation of nonexertional left sided chest pain. He appears well and in no acute distress and hemodynamically stable.    ED Course as of 03/23/24 0338   [place] Mar 22, 2024   2212 ECG 12 lead  Sinus rhythm, rate 92, QTc 420, no stemi   2212 POCT Glucose(!): 358   Mon Mar 23, 2024   0021 XR CHEST 2 VIEWS  FINDINGS/IMPRESSION:      Surgical clips, median sternotomy wires, mediastinal closure device are unchanged.     No pleural effusion, pneumothorax, pulmonary edema or focal consolidation.  Stable mild cardiomegaly.         Diagnoses as of 03/23/24 0338   Chest pain, unspecified type     The patient was treated with 324 mg chewable aspirin  given nitroglycerin allergy. ECG is nonischemic. EKG is nonischemic. The patient was treated with aspirin  given his nitroglycerin allergy. The patient then requested narcotics, and given his multiple recent ED  presentations with concern for possible drug-seeking behavior and that he is in no acute distress at this time, the patient was educated that there is no clinical indication for narcotics at this time and they will not be given. Upon this discussion, the patient decided to leave the hospital AMA. He was encouraged to stay for serial troponins to rule out ACS but declined.     The patient is clinically sober, free from distracting injury, appears to have intact insight and judgment and reason and in my opinion has the capacity to make decisions. They were encouraged to return at any time should they change their mind or develop worsening symptoms.     CLINICAL IMPRESSION  1. Chest pain, unspecified type         Final Disposition:   AGAINST MEDICAL ADVICE  _______________________________________________________________________________________________    Harlene Cleveland, PA          [  1]   Past Medical History:  Diagnosis Date    Coronary artery disease     Diabetes mellitus     Heart attack     x3    Hypertension     pt denies having high BP   [2]   Family History  Problem Relation Name Age of Onset    Heart disease Father     [3]   Past Surgical History:  Procedure Laterality Date    ARTERIAL BYPASS SURGERY      x2    CHOLECYSTECTOMY      CORONARY ARTERY BYPASS GRAFT     [4]   Allergies  Allergen Reactions    Ibuprofen Anaphylaxis, Hives, Itching and Rash     Other reaction(s): Anaphylactic reaction, Unknown, Urticaria  anaphylaxis  anaphylaxis      Ketorolac Anaphylaxis, Hives and Rash     Other reaction(s): ITCHY HIVES, Unknown, Urticaria  Converted from Generic Allergy: Ketorolac  Anaphylaxis   Anaphylaxis   Tolerates ASA      Nitroglycerin      hives    Tramadol Anaphylaxis, Angioedema, Hives and Rash     Other reaction(s): THROAT CLOSES, Unknown, Urticaria  Patient confirmed that he can take Percocet , Hydromorphone  and Morphine . [facility], [facility]acy  anaphylaxis  anaphylaxis      Codeine         Harlene Cleveland, [place]  03/23/24 [phone]

## 2024-03-22 NOTE — ED Triage Notes (Signed)
 Pt here with c/o CP that started 14 min ago.  Pt denies sob but states goes into L arm and jaw

## 2024-03-23 ENCOUNTER — Inpatient Hospital Stay
Admit: 2024-03-23 | Discharge: 2024-03-23 | Discharge status: Against Medical Advice | Payer: MEDICAID | Arrived: VH | Attending: Contractor

## 2024-03-23 LAB — POCT GLUCOSE: POCT Glucose: 358 mg/dL — ABNORMAL HIGH (ref 70–139)

## 2024-03-23 MED ORDER — ASPIRIN 81 MG CHEWABLE TABLET
81 | Freq: Once | ORAL | Status: AC
Start: 2024-03-23 — End: 2024-03-22
  Administered 2024-03-23: 03:00:00 324 mg via ORAL

## 2024-03-23 MED FILL — ASPIRIN 81 MG CHEWABLE TABLET: 81 81 mg | ORAL | Qty: 4 | Fill #0

## 2024-04-04 LAB — BMP (EXT)
Anion Gap (EXT): 9 (ref 5–15)
BUN (EXT): 11 mg/dL (ref 7–23)
CO2 (EXT): 27 mmol/L (ref 22–32)
CalciumCalcium (EXT): 9 mg/dL (ref 8.6–10.5)
Chloride (EXT): 101 mmol/L (ref 97–110)
Creatinine (EXT): 1.06 mg/dL (ref 0.60–1.30)
Glucose (EXT): 411 mg/dL — ABNORMAL HIGH (ref 65–99)
Potassium (EXT): 4.1 mmol/L (ref 3.5–5.3)
Sodium (EXT): 137 mmol/L (ref 135–145)
eGFR - Creat CKD-EPI (EXT): 88 mL/min/1.73m2 (ref >=60)

## 2024-04-19 LAB — CMP (EXT)
A/G Ratio (EXT): 1 ug/mL — ABNORMAL LOW (ref 1.2–2.4)
ALT/SGPT (EXT): 124 U/L — ABNORMAL HIGH (ref 7.0–40.0)
AST/SGOT (EXT): 64 U/L — ABNORMAL HIGH (ref 13–40)
Albumin (EXT): 3.9 g/dL (ref 3.4–5.0)
Alkaline Phosphatase (EXT): 68 U/L (ref 46.0–116.0)
Anion Gap (EXT): 10 mmol/L (ref 6–14)
BUN (EXT): 15 mg/dL (ref 9–23)
Bilirubin, Total (EXT): 0.3 mg/dL (ref 0.3–1.2)
CO2 (EXT): 27 mmol/L (ref 20–31)
CalciumCalcium (EXT): 10 mg/dL (ref 8.3–10.6)
Chloride (EXT): 97 mmol/L — ABNORMAL LOW (ref 98–107)
Creatinine (EXT): 0.74 mg/dL (ref 0.73–1.18)
Globulin (EXT): 4 g/dL — ABNORMAL HIGH (ref 2.0–3.9)
Glucose (EXT): 660 mg/dL — CR (ref 74–106)
Potassium (EXT): 4.6 mmol/L (ref 3.5–5.1)
Protein (EXT): 7.9 g/dL (ref 5.7–8.2)
Sodium (EXT): 134 mmol/L — ABNORMAL LOW (ref 136–145)
eGFR - Creat MDRD (EXT): 113 ml/min/1.73m2 (ref >60)

## 2024-04-20 LAB — CMP (EXT)
A/G Ratio (EXT): 1 ug/mL — ABNORMAL LOW (ref 1.2–2.4)
ALT/SGPT (EXT): 109 U/L — ABNORMAL HIGH (ref 7.0–40.0)
AST/SGOT (EXT): 65 U/L — ABNORMAL HIGH (ref 13–40)
Albumin (EXT): 3.4 g/dL (ref 3.4–5.0)
Alkaline Phosphatase (EXT): 62 U/L (ref 46.0–116.0)
Anion Gap (EXT): 9 mmol/L (ref 6–14)
BUN (EXT): 13 mg/dL (ref 9–23)
Bilirubin, Total (EXT): 0.3 mg/dL (ref 0.3–1.2)
CO2 (EXT): 27 mmol/L (ref 20–31)
CalciumCalcium (EXT): 8.8 mg/dL (ref 8.3–10.6)
Chloride (EXT): 100 mmol/L (ref 98–107)
Creatinine (EXT): 0.63 mg/dL — ABNORMAL LOW (ref 0.73–1.18)
Globulin (EXT): 3.5 g/dL (ref 2.0–3.9)
Glucose (EXT): 374 mg/dL — ABNORMAL HIGH (ref 74–106)
Potassium (EXT): 4.2 mmol/L (ref 3.5–5.1)
Protein (EXT): 6.9 g/dL (ref 5.7–8.2)
Sodium (EXT): 136 mmol/L (ref 136–145)
eGFR - Creat MDRD (EXT): 119 ml/min/1.73m2 (ref >60)

## 2024-04-20 LAB — BMP (EXT)
Anion Gap (EXT): 10 mmol/L (ref 6–14)
BUN (EXT): 9 mg/dL (ref 9–23)
CO2 (EXT): 26 mmol/L (ref 20–31)
CalciumCalcium (EXT): 8.7 mg/dL (ref 8.3–10.6)
Chloride (EXT): 101 mmol/L (ref 98–107)
Creatinine (EXT): 0.77 mg/dL (ref 0.73–1.18)
Glucose (EXT): 397 mg/dL — ABNORMAL HIGH (ref 74–106)
Potassium (EXT): 4.2 mmol/L (ref 3.5–5.1)
Sodium (EXT): 137 mmol/L (ref 136–145)
eGFR - Creat MDRD (EXT): 112 ml/min/1.73m2 (ref >60)

## 8290-10-20 DEATH — deceased
